# Patient Record
Sex: Male | Born: 1955 | Race: White | Hispanic: No | Marital: Married | Smoking: Former smoker
Health system: Southern US, Community
[De-identification: ages and names within clinical notes are randomized; demographics above are authoritative.]

## PROBLEM LIST (undated history)

## (undated) DIAGNOSIS — I739 Peripheral vascular disease, unspecified: Secondary | ICD-10-CM

## (undated) DIAGNOSIS — I480 Paroxysmal atrial fibrillation: Secondary | ICD-10-CM

## (undated) DIAGNOSIS — M1990 Unspecified osteoarthritis, unspecified site: Secondary | ICD-10-CM

## (undated) DIAGNOSIS — Q231 Congenital insufficiency of aortic valve: Secondary | ICD-10-CM

## (undated) DIAGNOSIS — I712 Thoracic aortic aneurysm, without rupture: Secondary | ICD-10-CM

## (undated) DIAGNOSIS — E119 Type 2 diabetes mellitus without complications: Secondary | ICD-10-CM

## (undated) DIAGNOSIS — N183 Chronic kidney disease, stage 3 unspecified: Secondary | ICD-10-CM

## (undated) DIAGNOSIS — D126 Benign neoplasm of colon, unspecified: Secondary | ICD-10-CM

## (undated) DIAGNOSIS — R0600 Dyspnea, unspecified: Secondary | ICD-10-CM

## (undated) DIAGNOSIS — C220 Liver cell carcinoma: Secondary | ICD-10-CM

## (undated) DIAGNOSIS — D689 Coagulation defect, unspecified: Secondary | ICD-10-CM

## (undated) DIAGNOSIS — E785 Hyperlipidemia, unspecified: Secondary | ICD-10-CM

## (undated) DIAGNOSIS — I209 Angina pectoris, unspecified: Secondary | ICD-10-CM

## (undated) DIAGNOSIS — I2510 Atherosclerotic heart disease of native coronary artery without angina pectoris: Secondary | ICD-10-CM

## (undated) DIAGNOSIS — I10 Essential (primary) hypertension: Secondary | ICD-10-CM

## (undated) HISTORY — DX: Peripheral vascular disease, unspecified: I73.9

## (undated) HISTORY — DX: Benign neoplasm of colon, unspecified: D12.6

## (undated) HISTORY — DX: Essential (primary) hypertension: I10

## (undated) HISTORY — PX: COLONOSCOPY W/ POLYPECTOMY: SHX1380

## (undated) HISTORY — PX: POLYPECTOMY: SHX149

## (undated) HISTORY — DX: Thoracic aortic aneurysm, without rupture: I71.2

## (undated) HISTORY — PX: TONSILLECTOMY: SUR1361

## (undated) HISTORY — PX: COLONOSCOPY: SHX174

## (undated) HISTORY — DX: Coagulation defect, unspecified: D68.9

## (undated) HISTORY — DX: Hyperlipidemia, unspecified: E78.5

## (undated) HISTORY — PX: COSMETIC SURGERY: SHX468

## (undated) HISTORY — DX: Congenital insufficiency of aortic valve: Q23.1

## (undated) HISTORY — PX: UPPER GASTROINTESTINAL ENDOSCOPY: SHX188

## (undated) HISTORY — DX: Type 2 diabetes mellitus without complications: E11.9

## (undated) HISTORY — DX: Paroxysmal atrial fibrillation: I48.0

## (undated) HISTORY — DX: Liver cell carcinoma: C22.0

---

## 2001-10-27 ENCOUNTER — Emergency Department (HOSPITAL_COMMUNITY): Admission: EM | Admit: 2001-10-27 | Discharge: 2001-10-27 | Payer: Self-pay | Admitting: Emergency Medicine

## 2001-11-25 ENCOUNTER — Encounter: Payer: Self-pay | Admitting: Gastroenterology

## 2006-01-29 ENCOUNTER — Ambulatory Visit: Payer: Self-pay | Admitting: Gastroenterology

## 2006-03-04 ENCOUNTER — Ambulatory Visit: Payer: Self-pay | Admitting: Gastroenterology

## 2008-01-27 ENCOUNTER — Telehealth: Payer: Self-pay | Admitting: Gastroenterology

## 2008-03-08 ENCOUNTER — Ambulatory Visit: Payer: Self-pay | Admitting: Gastroenterology

## 2008-03-08 DIAGNOSIS — K5130 Ulcerative (chronic) rectosigmoiditis without complications: Secondary | ICD-10-CM | POA: Insufficient documentation

## 2008-03-08 DIAGNOSIS — K5120 Ulcerative (chronic) proctitis without complications: Secondary | ICD-10-CM | POA: Insufficient documentation

## 2008-03-12 DIAGNOSIS — D126 Benign neoplasm of colon, unspecified: Secondary | ICD-10-CM

## 2008-03-12 HISTORY — DX: Benign neoplasm of colon, unspecified: D12.6

## 2008-04-07 ENCOUNTER — Encounter: Payer: Self-pay | Admitting: Gastroenterology

## 2008-04-07 ENCOUNTER — Ambulatory Visit: Payer: Self-pay | Admitting: Gastroenterology

## 2008-04-08 ENCOUNTER — Encounter: Payer: Self-pay | Admitting: Gastroenterology

## 2008-07-04 ENCOUNTER — Ambulatory Visit: Payer: Self-pay | Admitting: Gastroenterology

## 2008-07-04 DIAGNOSIS — Z860100 Personal history of colon polyps, unspecified: Secondary | ICD-10-CM | POA: Insufficient documentation

## 2008-07-04 DIAGNOSIS — K5190 Ulcerative colitis, unspecified, without complications: Secondary | ICD-10-CM | POA: Insufficient documentation

## 2008-12-13 ENCOUNTER — Telehealth: Payer: Self-pay | Admitting: Gastroenterology

## 2010-02-02 ENCOUNTER — Encounter (INDEPENDENT_AMBULATORY_CARE_PROVIDER_SITE_OTHER): Payer: Self-pay | Admitting: *Deleted

## 2010-02-16 ENCOUNTER — Encounter (INDEPENDENT_AMBULATORY_CARE_PROVIDER_SITE_OTHER): Payer: Self-pay | Admitting: *Deleted

## 2010-02-21 ENCOUNTER — Encounter (INDEPENDENT_AMBULATORY_CARE_PROVIDER_SITE_OTHER): Payer: Self-pay

## 2010-02-23 ENCOUNTER — Encounter (INDEPENDENT_AMBULATORY_CARE_PROVIDER_SITE_OTHER): Payer: Self-pay

## 2010-02-23 ENCOUNTER — Ambulatory Visit: Payer: Self-pay | Admitting: Gastroenterology

## 2010-04-10 ENCOUNTER — Ambulatory Visit: Payer: Self-pay | Admitting: Gastroenterology

## 2010-04-13 ENCOUNTER — Encounter: Payer: Self-pay | Admitting: Gastroenterology

## 2010-09-11 NOTE — Procedures (Signed)
Summary: Colonoscopy  Patient: Matthew Chen Note: All result statuses are Final unless otherwise noted.  Tests: (1) Colonoscopy (COL)   COL Colonoscopy           [facility].     [place], [place]  [address]           COLONOSCOPY PROCEDURE REPORT     PATIENT:  Matthew, Chen  MR#:  [id number]     BIRTHDATE:  [birth year], 7 yrs. old  GENDER:  male     ENDOSCOPIST:  Norberto Sorenson T. Fuller Plan, MD, [facility]           PROCEDURE DATE:  04/10/2010     PROCEDURE:  Colonoscopy with biopsy     ASA CLASS:  Class II     INDICATIONS:  surveillance and high-risk screening, evaluation of     ulcerative colitis, follow-up of polyp, adenomatous polyp, 03/2008.           MEDICATIONS:   Fentanyl 50 mcg IV, Versed 8 mg IV     DESCRIPTION OF PROCEDURE:   After the risks benefits and     alternatives of the procedure were thoroughly explained, informed     consent was obtained.  Digital rectal exam was performed and     revealed no abnormalities.   The LB PCF-H180AL [id number] endoscope     was introduced through the anus and advanced to the cecum, which     was identified by both the appendix and ileocecal valve, without     limitations.  The quality of the prep was excellent, using     MoviPrep.  The instrument was then slowly withdrawn as the colon     was fully examined.     <<PROCEDUREIMAGES>>     FINDINGS:  There were mucosal changes consistent with left-sided     ulcerative colitis. They were erythematous. Multiple psuedopolyps     in the descending colon. Random biopsies were obtained and sent to     pathology.  A normal appearing cecum, ileocecal valve, and     appendiceal orifice were identified. The ascending, hepatic     flexure, transverse, splenic flexure appeared unremarkable. Random     biopsies were obtained and sent to pathology.   Retroflexed views     in the rectum revealed internal hemorrhoids,small.  The time to     cecum =  3  minutes. The scope was then  withdrawn (time =  10     min) from the patient and the procedure completed.           COMPLICATIONS:  None           ENDOSCOPIC IMPRESSION:     1) Colitis - left UC     2) Internal hemorrhoids           RECOMMENDATIONS:     1) Await pathology results     2) Repeat Colonoscopy in 2 years pending pathology review     3) Schedule followup visit in 6 months           Malcolm T. Fuller Plan, MD, [facility]           CC: Margaretmary Eddy MD           n.     Lorrin MaisPricilla Riffle. Stark at 04/10/2010 09:30 AM           Donavan Burnet, [id number]  Note: An exclamation mark Marland Kitchen)  indicates a result that was not dispersed into the flowsheet. Document Creation Date: 04/10/2010 9:32 AM _______________________________________________________________________  (1) Order result status: Final Collection or observation date-time: 04/10/2010 09:23 Requested date-time:  Receipt date-time:  Reported date-time:  Referring Physician:   Ordering Physician: Lucio Edward [phone]) Specimen Source:  Source: Tawanna Cooler Order Number: [id number] Lab site:   Appended Document: Colonoscopy     Appended Document: Colonoscopy colon recall 2years 03/12/2012

## 2010-09-11 NOTE — Letter (Signed)
Summary: Patient Notice- Colon Biospy Results  [facility]  [address] [place], [place] [address]   Phone: [phone]  Fax: [phone]        April 13, 2010 MRN: [id number]    Matthew Chen [address] [place], [place]  [address]    Dear [name],  I am pleased to inform you that the biopsies taken during your recent colonoscopy did not show any evidence of cancer upon pathologic examination. The biopsies showed mild left sided ulcerative colitis.  Continue with the treatment plan as outlined on the day of your      exam.  You should have a repeat colonoscopy examination for this problem           in 2 years.  Please call us if you are having persistent problems or have questions about your condition that have not been fully answered at this time.  Sincerely,  [name] MD [facility]  This letter has been electronically signed by your physician.  Appended Document: Patient Notice- Colon Biospy Results letter mailed 9.7.2011

## 2010-09-11 NOTE — Miscellaneous (Signed)
Summary: Lec previsit  Clinical Lists Changes  Medications: Added new medication of MOVIPREP 100 GM  SOLR (PEG-KCL-NACL-NASULF-NA ASC-C) As per prep instructions. - Signed Rx of MOVIPREP 100 GM  SOLR (PEG-KCL-NACL-NASULF-NA ASC-C) As per prep instructions.;  #1 x 0;  Signed;  Entered by: Cornelia Copa RN;  Authorized by: [name] MD [facility];  Method used: Electronically to [facility] [phone]*, [address] [place], [place]  [address], Ph: [phone], Fax: [phone] Observations: Added new observation of ALLERGY REV: Done (02/23/2010 9:47)    Prescriptions: MOVIPREP 100 GM  SOLR (PEG-KCL-NACL-NASULF-NA ASC-C) As per prep instructions.  #1 x 0   Entered by:   Cornelia Copa RN   Authorized by:   [name] MD [facility]   Signed by:   Cornelia Copa RN on 02/23/2010   Method used:   Electronically to        [facility] [phone]* (retail)       [address]       [place], [place]  [address]       Ph: [phone]       Fax: [phone]   RxID:   [id number]

## 2010-09-11 NOTE — Letter (Signed)
Summary: Previsit letter  [facility]  [place], [place] [address]   Phone: [phone]  Fax: [phone]       02/16/2010 MRN: [id number]  Matthew Chen [place], [place]  [address]  Dear [name],  Welcome to the Gastroenterology Division at [facility].    You are scheduled to see a nurse for your pre-procedure visit on February 23, 2010 at 10:00 am on the 3rd floor at [facility].  We ask that you try to arrive at our office 15 minutes prior to your appointment time to allow for check-in.  Your nurse visit will consist of discussing your medical and surgical history, your immediate family medical history, and your medications.    Please bring a complete list of all your medications or, if you prefer, bring the medication bottles and we will list them.  We will need to be aware of both prescribed and over the counter drugs.  We will need to know exact dosage information as well.  If you are on blood thinners (Coumadin, Plavix, Aggrenox, Ticlid, etc.) please call our office today/prior to your appointment, as we need to consult with your physician about holding your medication.   Please be prepared to read and sign documents such as consent forms, a financial agreement, and acknowledgement forms.  If necessary, and with your consent, a friend or relative is welcome to sit-in on the nurse visit with you.  Please bring your insurance card so that we may make a copy of it.  If your insurance requires a referral to see a specialist, please bring your referral form from your primary care physician.  No co-pay is required for this nurse visit.     If you cannot keep your appointment, please call [phone] to cancel or reschedule prior to your appointment date.  This allows [place] the opportunity to schedule an appointment for another patient in need of care.    Thank you for choosing [facility] for your medical  needs.  We appreciate the opportunity to care for you.  Please visit [place] at our website  to learn more about our practice.                     Sincerely.                                                                                                                   The Gastroenterology Division

## 2010-09-11 NOTE — Letter (Signed)
Summary: Diabetic Instructions  [facility]  [place], [place] [address]   Phone: [phone]  Fax: [phone]    GRANTLEY SAVAGE 1956/07/21 MRN: [id number]   _  _   ORAL DIABETIC MEDICATION INSTRUCTIONS  The day before your procedure:   Take your diabetic pill as you do normally  The day of your procedure:   Do not take your diabetic pill    We will check your blood sugar levels during the admission process and again in Recovery before discharging you home  ________________________________________________________________________  _  _   INSULIN (LONG ACTING) MEDICATION INSTRUCTIONS (Lantus, NPH, 70/30, Humulin, Novolin-N)   The day before your procedure:   Take  your regular evening dose    The day of your procedure:   Do not take your morning dose    _  _   INSULIN (SHORT ACTING) MEDICATION INSTRUCTIONS (Regular, Humulog, Novolog)   The day before your procedure:   Do not take your evening dose   The day of your procedure:   Do not take your morning dose   _  _   INSULIN PUMP MEDICATION INSTRUCTIONS  We will contact the physician managing your diabetic care for written dosage instructions for the day before your procedure and the day of your procedure.  Once we have received the instructions, we will contact you.

## 2010-09-11 NOTE — Letter (Signed)
Summary: Colonoscopy Letter  [facility]  [place], [place] [address]   Phone: [phone]  Fax: [phone]      February 02, 2010 MRN: [id number]   NEILS SIRACUSA [address] [place], [place]  [address]   Dear Mr. Hulgan,   According to your medical record, it is time for you to schedule a Colonoscopy. The American Cancer Society recommends this procedure as a method to detect early colon cancer. Patients with a family history of colon cancer, or a personal history of colon polyps or inflammatory bowel disease are at increased risk.  This letter has beeen generated based on the recommendations made at the time of your procedure. If you feel that in your particular situation this may no longer apply, please contact our office.  Please call our office at [phone] to schedule this appointment or to update your records at your earliest convenience.  Thank you for cooperating with [place] to provide you with the very best care possible.   Sincerely,  Norberto Sorenson T. Fuller Plan, M.D.  [facility] [phone]

## 2010-09-11 NOTE — Letter (Signed)
Summary: [facility] Instructions  [facility]  [place], [place] [address]   Phone: [phone]  Fax: [phone]       MENDELL BONTEMPO    12-Feb-1968    MRN: [id number]        Procedure Day Sudie Grumbling:  Duke Salvia  04/10/10     Arrival Time:  8:00AM     Procedure Time:  9:00AM     Location of Procedure:                    [facility] _  [place] (4th Floor)                        [place]   Starting 5 days prior to your procedure 04/05/10 do not eat nuts, seeds, popcorn, corn, beans, peas,  salads, or any raw vegetables.  Do not take any fiber supplements (e.g. Metamucil, Citrucel, and Benefiber).  THE DAY BEFORE YOUR PROCEDURE         DATE: 04/09/10  DAY: MONDAY  1.  Drink clear liquids the entire day-NO SOLID FOOD  2.  Do not drink anything colored red or purple.  Avoid juices with pulp.  No orange juice.  3.  Drink at least 64 oz. (8 glasses) of fluid/clear liquids during the day to prevent dehydration and help the prep work efficiently.  CLEAR LIQUIDS INCLUDE: Water Jello Ice Popsicles Tea (sugar ok, no milk/cream) Powdered fruit flavored drinks Coffee (sugar ok, no milk/cream) Gatorade Juice: apple, white grape, white cranberry  Lemonade Clear bullion, consomm, broth Carbonated beverages (any kind) Strained chicken noodle soup Hard Candy                             4.  In the morning, mix first dose of MoviPrep solution:    Empty 1 Pouch A and 1 Pouch B into the disposable container    Add lukewarm drinking water to the top line of the container. Mix to dissolve    Refrigerate (mixed solution should be used within 24 hrs)  5.  Begin drinking the prep at 5:00 p.m. The MoviPrep container is divided by 4 marks.   Every 15 minutes drink the solution down to the next mark (approximately 8 oz) until the full liter is complete.   6.  Follow completed prep with 16 oz of clear liquid of your choice (Nothing  red or purple).  Continue to drink clear liquids until bedtime.  7.  Before going to bed, mix second dose of MoviPrep solution:    Empty 1 Pouch A and 1 Pouch B into the disposable container    Add lukewarm drinking water to the top line of the container. Mix to dissolve    Refrigerate  THE DAY OF YOUR PROCEDURE      DATE: 04/10/10   DAY: TUESDAY  Beginning at 4:00AM (5 hours before procedure)         1. Every 15 minutes, drink the solution down to the next mark (approx 8 oz) until the full liter is complete.  2. Follow completed prep with 16 oz. of clear liquid of your choice.    3. You may drink clear liquids until 7:00AM  (2  HOURS BEFORE PROCEDURE)   MEDICATION INSTRUCTIONS  Unless otherwise instructed, you should take regular prescription medications with a small sip of water   as early as  possible the morning of your procedure.  Diabetic patients - see separate instructions. .  Additional medication instructions: Do not take the Diovan am of procedure.         OTHER INSTRUCTIONS  You will need a responsible adult at least 55 years of age to accompany you and drive you home.   This person must remain in the waiting room during your procedure.  Wear loose fitting clothing that is easily removed.  Leave jewelry and other valuables at home.  However, you may wish to bring a book to read or  an iPod/MP3 player to listen to music as you wait for your procedure to start.  Remove all body piercing jewelry and leave at home.  Total time from sign-in until discharge is approximately 2-3 hours.  You should go home directly after your procedure and rest.  You can resume normal activities the  day after your procedure.  The day of your procedure you should not:   Drive   Make legal decisions   Operate machinery   Drink alcohol   Return to work  You will receive specific instructions about eating, activities and medications before you leave.    The above  instructions have been reviewed and explained to me by   Cornelia Copa RN  February 23, 2010 10:10 AM    I fully understand and can verbalize these instructions _____________________________ Date _________

## 2010-12-28 NOTE — Assessment & Plan Note (Signed)
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  His symptoms have completely abated on Colazal and Canasa  suppositories.  He has no gastrointestinal complaints.   MEDICATIONS:  Listed on the chart, updated, and reviewed.   MEDICATION ALLERGIES:  PENICILLIN.   EXAM:  GENERAL:  No acute distress.  VITALS:  Weight 214.2 pounds.  Blood pressure 126/64.  Pulse 78 and regular.  CHEST:  Clear to auscultation bilaterally.  CARDIAC:  Regular rate and rhythm without murmurs.  ABDOMEN:  Soft, nontender, nondistended.  Normoactive bowel sounds.  No  palpable organomegaly, masses, or hernias.   LABORATORY DATA:  Reports from stool studies dated February 03, 2006, reviewed.  All negative except for PMNs noted.   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  Discontinue Canasa and maintain Colazal at 750 mg, 3  p.o. t.i.d.  Return office visit in six months.                                   Pricilla Riffle. Dagoberto Ligas., MD, [facility]   MTS/MedQ  DD:  03/09/2006  DT:  03/09/2006  Job #:  [id number]

## 2011-04-19 ENCOUNTER — Other Ambulatory Visit: Payer: Self-pay | Admitting: Gastroenterology

## 2011-04-19 MED ORDER — BALSALAZIDE DISODIUM 750 MG PO CAPS: 2250.0000 mg | ORAL_CAPSULE | Freq: Three times a day (TID) | ORAL | Status: DC

## 2011-04-19 NOTE — Telephone Encounter (Signed)
Informed patient that I will send him one refill to his pharmacy but he needs to schedule a office visit for any further refills. Pt needed a 6 month f/u after Colonoscopy last year and had not scheduled one. Pt agreed and scheduled a office visit. Informed patient that he will not get any more refills until he comes for the office visit. Pt agreed and verbalized understanding.

## 2011-05-01 ENCOUNTER — Ambulatory Visit (INDEPENDENT_AMBULATORY_CARE_PROVIDER_SITE_OTHER): Payer: BC Managed Care – PPO | Admitting: Gastroenterology

## 2011-05-01 ENCOUNTER — Encounter: Payer: Self-pay | Admitting: Gastroenterology

## 2011-05-01 VITALS — BP 110/68 | HR 72 | Ht 70.0 in | Wt 230.0 lb

## 2011-05-01 DIAGNOSIS — R142 Eructation: Secondary | ICD-10-CM

## 2011-05-01 DIAGNOSIS — K5190 Ulcerative colitis, unspecified, without complications: Secondary | ICD-10-CM

## 2011-05-01 DIAGNOSIS — R141 Gas pain: Secondary | ICD-10-CM

## 2011-05-01 MED ORDER — BALSALAZIDE DISODIUM 750 MG PO CAPS: 2250.0000 mg | ORAL_CAPSULE | Freq: Three times a day (TID) | ORAL | Status: DC

## 2011-05-01 MED ORDER — ALIGN 4 MG PO CAPS: 1.0000 | ORAL_CAPSULE | Freq: Every day | ORAL | Status: DC

## 2011-05-01 NOTE — Patient Instructions (Addendum)
Your prescription for Colazal has been sent to the pharmacy.  Start Align one capsule by mouth once daily x 1 month.  Low gas diet given. Use Gas-X four times a day as needed. cc: Odette Fraction, MD

## 2011-05-01 NOTE — Progress Notes (Signed)
History of Present Illness: This is a 55 year old male returning for followup of ulcerative colitis. He notes increased flatus and slightly looser stools for the past 6 weeks. He has not noted multiple bowel movements daily, or mucous per rectum or bleeding per rectum. He denies any recent medication changes or recent antibiotic usage. Denies weight loss, abdominal pain, constipation, diarrhea, change in stool caliber, melena, hematochezia, nausea, vomiting, dysphagia, reflux symptoms, chest pain.  Current Medications, Allergies, Past Medical History, Past Surgical History, Family History and Social History were reviewed in [employer] record.  Physical Exam: General: Well developed , well nourished, no acute distress Head: Normocephalic and atraumatic Eyes:  sclerae anicteric, EOMI Ears: Normal auditory acuity Mouth: No deformity or lesions Lungs: Clear throughout to auscultation Heart: Regular rate and rhythm; no murmurs, rubs or bruits Abdomen: Soft, non tender and non distended. No masses, hepatosplenomegaly or hernias noted. Normal Bowel sounds Musculoskeletal: Symmetrical with no gross deformities  Pulses:  Normal pulses noted Extremities: No clubbing, cyanosis, edema or deformities noted Neurological: Alert oriented x 4, grossly nonfocal Psychological:  Alert and cooperative. Normal mood and affect  Assessment and Recommendations:  1. Ulcerative pancolitis. Continue balsalzide at current dosage. His recent changes in bowel pattern could be an early sign of a colitis flare. Trial of a daily probiotic and Gas-X 4 times a day when necessary. Request blood work recently performed at Dr. Samella Parr office. If his symptoms fail to resolve he is to contact us. Surveillance colonoscopy August 2013.

## 2011-12-17 ENCOUNTER — Other Ambulatory Visit: Payer: Self-pay | Admitting: Gastroenterology

## 2011-12-17 MED ORDER — BALSALAZIDE DISODIUM 750 MG PO CAPS: 2250.0000 mg | ORAL_CAPSULE | Freq: Three times a day (TID) | ORAL | Status: DC

## 2011-12-17 NOTE — Telephone Encounter (Signed)
Sent prescription to the pharmacy.

## 2012-01-09 ENCOUNTER — Encounter: Payer: Self-pay | Admitting: Gastroenterology

## 2012-02-04 ENCOUNTER — Encounter: Payer: Self-pay | Admitting: Gastroenterology

## 2012-03-24 ENCOUNTER — Ambulatory Visit (AMBULATORY_SURGERY_CENTER): Payer: BC Managed Care – PPO

## 2012-03-24 ENCOUNTER — Encounter: Payer: Self-pay | Admitting: Gastroenterology

## 2012-03-24 VITALS — Ht 70.0 in | Wt 229.3 lb

## 2012-03-24 DIAGNOSIS — Z800 Family history of malignant neoplasm of digestive organs: Secondary | ICD-10-CM

## 2012-03-24 DIAGNOSIS — Z1211 Encounter for screening for malignant neoplasm of colon: Secondary | ICD-10-CM

## 2012-03-24 DIAGNOSIS — K5190 Ulcerative colitis, unspecified, without complications: Secondary | ICD-10-CM

## 2012-03-24 DIAGNOSIS — Z86010 Personal history of colonic polyps: Secondary | ICD-10-CM

## 2012-03-24 DIAGNOSIS — Z8371 Family history of colonic polyps: Secondary | ICD-10-CM

## 2012-03-24 MED ORDER — MOVIPREP 100 G PO SOLR: ORAL | Status: DC

## 2012-04-07 ENCOUNTER — Ambulatory Visit (AMBULATORY_SURGERY_CENTER): Payer: BC Managed Care – PPO | Admitting: Gastroenterology

## 2012-04-07 ENCOUNTER — Encounter: Payer: Self-pay | Admitting: Gastroenterology

## 2012-04-07 VITALS — BP 123/82 | HR 99 | Temp 97.9°F | Resp 14 | Ht 70.0 in | Wt 230.0 lb

## 2012-04-07 DIAGNOSIS — D126 Benign neoplasm of colon, unspecified: Secondary | ICD-10-CM

## 2012-04-07 DIAGNOSIS — Z1211 Encounter for screening for malignant neoplasm of colon: Secondary | ICD-10-CM

## 2012-04-07 DIAGNOSIS — Z86010 Personal history of colonic polyps: Secondary | ICD-10-CM

## 2012-04-07 DIAGNOSIS — K5190 Ulcerative colitis, unspecified, without complications: Secondary | ICD-10-CM

## 2012-04-07 MED ORDER — SODIUM CHLORIDE 0.9 % IV SOLN: 500.0000 mL | INTRAVENOUS | Status: DC

## 2012-04-07 NOTE — Progress Notes (Signed)
Patient did not experience any of the following events: a burn prior to discharge; a fall within the facility; wrong site/side/patient/procedure/implant event; or a hospital transfer or hospital admission upon discharge from the facility. (G8907) Patient did not have preoperative order for IV antibiotic SSI prophylaxis. (G8918)  

## 2012-04-07 NOTE — Op Note (Signed)
[facility]. [place], [address]   COLONOSCOPY PROCEDURE REPORT PATIENT: Matthew Chen, Matthew Chen  MR#: [id number] BIRTHDATE: August 04, 1956 , 56  yrs. old GENDER: Male ENDOSCOPIST: [name], MD, [facility] Referring Physician] PROCEDURE DATE:  04/07/2012 PROCEDURE:   Colonoscopy with snare polypectomy and Colonoscopy with biopsy ASA CLASS:   Class II INDICATIONS:high risk patient with UC pancolitis 8+ years, and high risk patient with personal history of colonic polyps, 03/2008. MEDICATIONS: MAC sedation, administered by CRNA and propofol (Diprivan) 225m IV DESCRIPTION OF PROCEDURE:   After the risks benefits and alternatives of the procedure were thoroughly explained, informed consent was obtained.  A digital rectal exam revealed no abnormalities of the rectum.   The LB CF-H180AL [id number] endoscope was introduced through the anus and advanced to the cecum, which was identified by both the appendix and ileocecal valve. No adverse events experienced.   The quality of the prep was good, using MoviPrep  The instrument was then slowly withdrawn as the colon was fully examined.  COLON FINDINGS: A sessile polyp measuring 7 mm in size was found at the splenic flexure.  A polypectomy was performed with a cold snare.  A sessile polyp measuring 6 mm in size was found in the descending colon.  A polypectomy was performed with a cold snare. A sessile polyp measuring 6 mm in size was found in the sigmoid colon.  A polypectomy was performed with a cold snare. Pseudopolyps were found in the descending colon. Mild colitis was found in the left colon with erythema and slight loss of vascular pattern. Multiple random biopsies of the area were performed throughout the colon. The colon mucosa was otherwise normal. Retroflexed views revealed small internal hemorrhoids. The time to cecum=2 minutes 20 seconds  Withdrawal time=11 minutes 30 seconds. The scope was withdrawn and the  procedure completed. COMPLICATIONS: There were no complications.  ENDOSCOPIC IMPRESSION: 1.   Sessile polyp at the splenic flexure; polypectomy with a cold snare 2.   Sessile polyp in the descending colon; polypectomy with a cold snare 3.   Sessile polyp in the sigmoid colon; polypectomy with a cold snare 4.   Pseudopolyps in the descending colon 5.   Mild left sided colitis 6.   Small internal hemorrhoids  RECOMMENDATIONS: 1.  Await pathology results 2.  Repeat Colonoscopy in 2 years pending pathology review  eSigned:  MLadene Artist MD, FTah[facility]08/27/2013 11:08 AM   cc: WJenna Luo MD   PATIENT NAME:  Matthew Chen, EngramMR#: [id number]

## 2012-04-07 NOTE — Patient Instructions (Signed)
YOU HAD AN ENDOSCOPIC PROCEDURE TODAY AT THE [facility]: Refer to the procedure report that was given to you for any specific questions about what was found during the examination.  If the procedure report does not answer your questions, please call your gastroenterologist to clarify.  If you requested that your care partner not be given the details of your procedure findings, then the procedure report has been included in a sealed envelope for you to review at your convenience later.  YOU SHOULD EXPECT: Some feelings of bloating in the abdomen. Passage of more gas than usual.  Walking can help get rid of the air that was put into your GI tract during the procedure and reduce the bloating. If you had a lower endoscopy (such as a colonoscopy or flexible sigmoidoscopy) you may notice spotting of blood in your stool or on the toilet paper. If you underwent a bowel prep for your procedure, then you may not have a normal bowel movement for a few days.  DIET: Your first meal following the procedure should be a light meal and then it is ok to progress to your normal diet.  A half-sandwich or bowl of soup is an example of a good first meal.  Heavy or fried foods are harder to digest and may make you feel nauseous or bloated.  Likewise meals heavy in dairy and vegetables can cause extra gas to form and this can also increase the bloating.  Drink plenty of fluids but you should avoid alcoholic beverages for 24 hours.  ACTIVITY: Your care partner should take you home directly after the procedure.  You should plan to take it easy, moving slowly for the rest of the day.  You can resume normal activity the day after the procedure however you should NOT DRIVE or use heavy machinery for 24 hours (because of the sedation medicines used during the test).    SYMPTOMS TO REPORT IMMEDIATELY: A gastroenterologist can be reached at any hour.  During normal business hours, 8:30 AM to 5:00 PM Monday through Friday,  call [phone].  After hours and on weekends, please call the GI answering service at [phone] who will take a message and have the physician on call contact you.   Following lower endoscopy (colonoscopy or flexible sigmoidoscopy):  Excessive amounts of blood in the stool  Significant tenderness or worsening of abdominal pains  Swelling of the abdomen that is new, acute  Fever of 100F or higher  Following upper endoscopy (EGD)  Vomiting of blood or coffee ground material  New chest pain or pain under the shoulder blades  Painful or persistently difficult swallowing  New shortness of breath  Fever of 100F or higher  Black, tarry-looking stools  FOLLOW UP: If any biopsies were taken you will be contacted by phone or by letter within the next 1-3 weeks.  Call your gastroenterologist if you have not heard about the biopsies in 3 weeks.  Our staff will call the home number listed on your records the next business day following your procedure to check on you and address any questions or concerns that you may have at that time regarding the information given to you following your procedure. This is a courtesy call and so if there is no answer at the home number and we have not heard from you through the emergency physician on call, we will assume that you have returned to your regular daily activities without incident.  SIGNATURES/CONFIDENTIALITY: You and/or your care   partner have signed paperwork which will be entered into your electronic medical record.  These signatures attest to the fact that that the information above on your After Visit Summary has been reviewed and is understood.  Full responsibility of the confidentiality of this discharge information lies with you and/or your care-partner.  

## 2012-04-08 ENCOUNTER — Telehealth: Payer: Self-pay | Admitting: *Deleted

## 2012-04-08 NOTE — Telephone Encounter (Signed)
  Follow up Call-  Call back number 04/07/2012  Post procedure Call Back phone  # [phone]  Permission to leave phone message Yes     Patient questions:  Do you have a fever, pain , or abdominal swelling? no Pain Score  0 *  Have you tolerated food without any problems? yes  Have you been able to return to your normal activities? yes  Do you have any questions about your discharge instructions: Diet   no Medications  no Follow up visit  no  Do you have questions or concerns about your Care? no  Actions: * If pain score is 4 or above: No action needed, pain <4.

## 2012-04-14 ENCOUNTER — Encounter: Payer: Self-pay | Admitting: Gastroenterology

## 2012-06-26 ENCOUNTER — Other Ambulatory Visit: Payer: Self-pay

## 2012-06-26 MED ORDER — BALSALAZIDE DISODIUM 750 MG PO CAPS: 2250.0000 mg | ORAL_CAPSULE | Freq: Three times a day (TID) | ORAL | Status: DC

## 2012-12-15 ENCOUNTER — Telehealth: Payer: Self-pay | Admitting: Family Medicine

## 2012-12-16 MED ORDER — BENAZEPRIL HCL 40 MG PO TABS: 40.0000 mg | ORAL_TABLET | Freq: Every day | ORAL | Status: DC

## 2012-12-16 MED ORDER — SIMVASTATIN 20 MG PO TABS: 20.0000 mg | ORAL_TABLET | Freq: Every evening | ORAL | Status: DC

## 2012-12-16 MED ORDER — GLIPIZIDE 10 MG PO TABS: 10.0000 mg | ORAL_TABLET | Freq: Every day | ORAL | Status: DC

## 2012-12-16 MED ORDER — HYDROCHLOROTHIAZIDE 25 MG PO TABS: 25.0000 mg | ORAL_TABLET | Freq: Every day | ORAL | Status: DC

## 2012-12-16 MED ORDER — FENOFIBRATE 160 MG PO TABS: 160.0000 mg | ORAL_TABLET | Freq: Every day | ORAL | Status: DC

## 2012-12-16 NOTE — Telephone Encounter (Signed)
Rx Refilled  

## 2013-01-16 ENCOUNTER — Other Ambulatory Visit: Payer: Self-pay | Admitting: Gastroenterology

## 2013-01-18 NOTE — Telephone Encounter (Signed)
NEEDS OFFICE VISIT.

## 2013-02-22 ENCOUNTER — Encounter: Payer: Self-pay | Admitting: Family Medicine

## 2013-02-22 ENCOUNTER — Ambulatory Visit (INDEPENDENT_AMBULATORY_CARE_PROVIDER_SITE_OTHER): Payer: BC Managed Care – PPO | Admitting: Family Medicine

## 2013-02-22 VITALS — BP 130/80 | HR 68 | Temp 97.7°F | Resp 18 | Wt 229.0 lb

## 2013-02-22 DIAGNOSIS — E119 Type 2 diabetes mellitus without complications: Secondary | ICD-10-CM

## 2013-02-22 DIAGNOSIS — D489 Neoplasm of uncertain behavior, unspecified: Secondary | ICD-10-CM

## 2013-02-22 DIAGNOSIS — I10 Essential (primary) hypertension: Secondary | ICD-10-CM

## 2013-02-22 DIAGNOSIS — E785 Hyperlipidemia, unspecified: Secondary | ICD-10-CM

## 2013-02-22 LAB — COMPLETE METABOLIC PANEL WITH GFR
ALT: 35 U/L (ref 0–53)
Albumin: 4.3 g/dL (ref 3.5–5.2)
Alkaline Phosphatase: 53 U/L (ref 39–117)
CO2: 24 mEq/L (ref 19–32)
GFR, Est African American: 58 mL/min — ABNORMAL LOW
GFR, Est Non African American: 51 mL/min — ABNORMAL LOW
Glucose, Bld: 145 mg/dL — ABNORMAL HIGH (ref 70–99)
Potassium: 4.6 mEq/L (ref 3.5–5.3)
Sodium: 140 mEq/L (ref 135–145)
Total Protein: 6.9 g/dL (ref 6.0–8.3)

## 2013-02-22 LAB — LIPID PANEL
LDL Cholesterol: 59 mg/dL (ref 0–99)
Total CHOL/HDL Ratio: 4.4 Ratio
Triglycerides: 261 mg/dL — ABNORMAL HIGH (ref <150)
VLDL: 52 mg/dL — ABNORMAL HIGH (ref 0–40)

## 2013-02-22 NOTE — Progress Notes (Signed)
Subjective:    Patient ID: Matthew Chen, male    DOB: Sep 19, 1955, 57 y.o.   MRN: [id number]  HPI Patient is here for followup of his diabetes for which he takes glipizide 10 mg by mouth daily.  He denies polyuria or polydipsia. His random sugars are 80-110.  He denies peripheral neuropathy..  he denies hypoglycemia.  He is also here for followup of his hypertension. He is taking benazepril 40 mg by mouth daily and hydrochlorothiazide 25 mg by mouth daily. He denies chest pain, shortness of breath, or dyspnea on exertion.Marland Kitchen  He also is here for hyperlipidemia. He is taking Lipitor 80 mg a day, and fenofibrate 160 mg a day. He denies myalgia or right upper quadrant pain.  He also has a brown elevated papule on his right temple he is requesting a biopsy due to concern about possible melanoma. He also has a 1 cm lesion on his right forearm that has been there for 6 months raised edges and central ulceration he is concerned could be ringworm but in my opinion looks more like a basal cell carcinoma. Past Medical History  Diagnosis Date  . Ulcerative colitis   . Hemorrhoids   . Diabetes mellitus   . Hyperlipidemia   . Hypertension   . Adenomatous colon polyp 03/2008   Current Outpatient Prescriptions on File Prior to Visit  Medication Sig Dispense Refill  . atorvastatin (LIPITOR) 80 MG tablet Take 80 mg by mouth daily.        . balsalazide (COLAZAL) 750 MG capsule TAKE 3 CAPSULES BY MOUTH 3 TIMES A DAY  270 capsule  0  . benazepril (LOTENSIN) 40 MG tablet Take 1 tablet (40 mg total) by mouth daily.  30 tablet  5  . fenofibrate 160 MG tablet Take 1 tablet (160 mg total) by mouth daily.  30 tablet  5  . glipiZIDE (GLUCOTROL) 10 MG tablet Take 1 tablet (10 mg total) by mouth daily.  30 tablet  5  . hydrochlorothiazide (HYDRODIURIL) 25 MG tablet Take 1 tablet (25 mg total) by mouth daily.  30 tablet  5  . Probiotic Product (ALIGN) 4 MG CAPS Take 1 capsule by mouth daily.  30 capsule  0  . simvastatin  (ZOCOR) 20 MG tablet Take 1 tablet (20 mg total) by mouth every evening.  30 tablet  5   No current facility-administered medications on file prior to visit.   Allergies  Allergen Reactions  . Penicillins     unsure   History   Social History  . Marital Status: Married    Spouse Name: N/A    Number of Children: 2  . Years of Education: N/A   Occupational History  . Truck driver   .     Social History Main Topics  . Smoking status: Former Smoker    Types: Cigarettes    Quit date: 08/12/1996  . Smokeless tobacco: Former Systems developer    Types: Chew    Quit date: 08/12/1996  . Alcohol Use: No     Comment: occasionally  . Drug Use: No  . Sexually Active: Not on file   Other Topics Concern  . Not on file   Social History Narrative  . No narrative on file   Family History  Problem Relation Age of Onset  . Colon cancer Maternal Grandmother     in her 86's  . Colon polyps Mother     in her 54's  . Diabetes Mother   . Diabetes  Maternal Aunt     Aunts and Uncles  . Heart disease Father   . Esophageal cancer Neg Hx   . Stomach cancer Neg Hx   . Rectal cancer Neg Hx       Review of Systems  All other systems reviewed and are negative.       Objective:   Physical Exam  Vitals reviewed. Constitutional: He is oriented to person, place, and time. He appears well-developed and well-nourished.  Cardiovascular: Normal rate, regular rhythm and intact distal pulses.   No murmur heard. Pulmonary/Chest: Effort normal and breath sounds normal.  Abdominal: Soft. Bowel sounds are normal. He exhibits no distension and no mass. There is no tenderness. There is no rebound and no guarding.  Neurological: He is alert and oriented to person, place, and time.  Skin: Skin is warm. No rash noted. No pallor.   there is a 1 cm brown papule on his right temple and a 1 cm lesion on his right forearm as I described in the history of present illness. Diabetic foot exam is performed.         Assessment & Plan:  1. Type II or unspecified type diabetes mellitus without mention of complication, not stated as uncontrolled Check hemoglobin A1c, Goal is less than 6.5. - Hemoglobin A1c  2. HTN (hypertension) Blood pressures well controlled. Continue current medications at present dosages.  3. HLD (hyperlipidemia) Next fasting lipid panel, goal LDL is less than 100. - COMPLETE METABOLIC PANEL WITH GFR - Lipid panel  4. Neoplasm of uncertain behavior I anesthetized both the lesion on his right temple and lesion on his right forearm 0.1% lidocaine with epinephrine. Using sterile technique, both lesions were then biopsied using a shave biopsy and sent to pathology in container is labeled "head" and "forearm".  Await pathology results. Hemostasis was achieved with Drysol and a Band-Aid - Pathology - Pathology

## 2013-02-23 ENCOUNTER — Encounter: Payer: Self-pay | Admitting: Family Medicine

## 2013-02-24 ENCOUNTER — Other Ambulatory Visit: Payer: Self-pay | Admitting: Gastroenterology

## 2013-02-24 NOTE — Telephone Encounter (Signed)
NEEDS OFFICE VISIT FOR ANY FURTHER REFILLS! 

## 2013-04-05 ENCOUNTER — Other Ambulatory Visit: Payer: Self-pay | Admitting: Gastroenterology

## 2013-04-06 NOTE — Telephone Encounter (Signed)
NEEDS OFFICE VISIT FOR FURTHER REFILLS!!

## 2013-07-16 ENCOUNTER — Encounter: Payer: Self-pay | Admitting: Family Medicine

## 2013-07-16 ENCOUNTER — Other Ambulatory Visit: Payer: Self-pay | Admitting: Family Medicine

## 2013-07-16 MED ORDER — ATORVASTATIN CALCIUM 80 MG PO TABS: 80.0000 mg | ORAL_TABLET | Freq: Every day | ORAL | Status: DC

## 2013-07-16 NOTE — Telephone Encounter (Signed)
All refills have already been addressed

## 2013-07-16 NOTE — Telephone Encounter (Signed)
Medication refilled per protocol. 

## 2013-07-22 ENCOUNTER — Ambulatory Visit (INDEPENDENT_AMBULATORY_CARE_PROVIDER_SITE_OTHER): Payer: BC Managed Care – PPO | Admitting: Family Medicine

## 2013-07-22 ENCOUNTER — Encounter: Payer: Self-pay | Admitting: Family Medicine

## 2013-07-22 VITALS — BP 132/74 | HR 82 | Temp 97.4°F | Resp 16 | Ht 70.0 in | Wt 234.0 lb

## 2013-07-22 DIAGNOSIS — H60391 Other infective otitis externa, right ear: Secondary | ICD-10-CM

## 2013-07-22 DIAGNOSIS — Z23 Encounter for immunization: Secondary | ICD-10-CM

## 2013-07-22 DIAGNOSIS — H60399 Other infective otitis externa, unspecified ear: Secondary | ICD-10-CM

## 2013-07-22 MED ORDER — CIPROFLOXACIN-DEXAMETHASONE 0.3-0.1 % OT SUSP: 4.0000 [drp] | Freq: Two times a day (BID) | OTIC | Status: DC

## 2013-07-22 MED ORDER — CEFDINIR 300 MG PO CAPS: 300.0000 mg | ORAL_CAPSULE | Freq: Two times a day (BID) | ORAL | Status: DC

## 2013-07-22 NOTE — Addendum Note (Signed)
Addended by: Shary Decamp B on: 07/22/2013 03:47 PM   Modules accepted: Orders

## 2013-07-22 NOTE — Progress Notes (Signed)
   Subjective:    Patient ID: Matthew Chen, male    DOB: 09-29-1955, 57 y.o.   MRN: [id number]  HPI  Patient reports one week of earache in the right year. He denies any fevers or chills. He is concerned he may have a cerumen impaction. The pain is such that is keeping him from sleeping at night.  He has not had any recent upper respiratory infections Past Medical History  Diagnosis Date  . Ulcerative colitis   . Hemorrhoids   . Diabetes mellitus   . Hyperlipidemia   . Hypertension   . Adenomatous colon polyp 03/2008   Current Outpatient Prescriptions on File Prior to Visit  Medication Sig Dispense Refill  . atorvastatin (LIPITOR) 80 MG tablet Take 1 tablet (80 mg total) by mouth daily.  30 tablet  1  . balsalazide (COLAZAL) 750 MG capsule TAKE 3 CAPSULES BY MOUTH 3 TIMES A DAY  270 capsule  0  . benazepril (LOTENSIN) 40 MG tablet TAKE 1 TABLET BY MOUTH EVERY DAY  30 tablet  1  . fenofibrate 160 MG tablet TAKE 1 TABLET BY MOUTH EVERY DAY  30 tablet  1  . glipiZIDE (GLUCOTROL) 10 MG tablet TAKE 1 TABLET BY MOUTH EVERY DAY  30 tablet  1  . hydrochlorothiazide (HYDRODIURIL) 25 MG tablet TAKE 1 TABLET BY MOUTH EVERY DAY  30 tablet  1  . Probiotic Product (ALIGN) 4 MG CAPS Take 1 capsule by mouth daily.  30 capsule  0   No current facility-administered medications on file prior to visit.   Allergies  Allergen Reactions  . Penicillins     unsure   History   Social History  . Marital Status: Married    Spouse Name: N/A    Number of Children: 2  . Years of Education: N/A   Occupational History  . Truck driver   .     Social History Main Topics  . Smoking status: Former Smoker    Types: Cigarettes    Quit date: 08/12/1996  . Smokeless tobacco: Former Systems developer    Types: Chew    Quit date: 08/12/1996  . Alcohol Use: No     Comment: occasionally  . Drug Use: No  . Sexual Activity: Not on file   Other Topics Concern  . Not on file   Social History Narrative  . No narrative on  file     Review of Systems  All other systems reviewed and are negative.       Objective:   Physical Exam  Vitals reviewed. Constitutional: He appears well-developed and well-nourished.  HENT:  Right Ear: There is swelling. Tympanic membrane is not injected, not scarred, not perforated and not erythematous.  Left Ear: External ear normal.  Nose: Nose normal.  Mouth/Throat: Oropharynx is clear and moist. No oropharyngeal exudate.   right external auditory canal is diffusely swollen, tender to the touch, and covered with a white exudate.        Assessment & Plan:  1. Otitis, externa, infective, right Recheck in one week if no better or sooner if - cefdinir (OMNICEF) 300 MG capsule; Take 1 capsule (300 mg total) by mouth 2 (two) times daily.  Dispense: 20 capsule; Refill: 0 - ciprofloxacin-dexamethasone (CIPRODEX) otic suspension; Place 4 drops into the right ear 2 (two) times daily.  Dispense: 7.5 mL; Refill: 0

## 2013-08-02 ENCOUNTER — Ambulatory Visit: Payer: BC Managed Care – PPO | Admitting: Family Medicine

## 2013-08-23 ENCOUNTER — Ambulatory Visit (INDEPENDENT_AMBULATORY_CARE_PROVIDER_SITE_OTHER): Payer: BC Managed Care – PPO | Admitting: Family Medicine

## 2013-08-23 ENCOUNTER — Encounter: Payer: Self-pay | Admitting: Family Medicine

## 2013-08-23 VITALS — BP 128/84 | HR 100 | Temp 97.1°F | Resp 20 | Ht 70.0 in | Wt 231.0 lb

## 2013-08-23 DIAGNOSIS — E119 Type 2 diabetes mellitus without complications: Secondary | ICD-10-CM

## 2013-08-23 DIAGNOSIS — E785 Hyperlipidemia, unspecified: Secondary | ICD-10-CM

## 2013-08-23 DIAGNOSIS — I10 Essential (primary) hypertension: Secondary | ICD-10-CM

## 2013-08-23 LAB — COMPLETE METABOLIC PANEL WITH GFR
ALT: 63 U/L — ABNORMAL HIGH (ref 0–53)
AST: 47 U/L — ABNORMAL HIGH (ref 0–37)
Albumin: 4.3 g/dL (ref 3.5–5.2)
Alkaline Phosphatase: 62 U/L (ref 39–117)
BILIRUBIN TOTAL: 0.6 mg/dL (ref 0.3–1.2)
BUN: 21 mg/dL (ref 6–23)
CHLORIDE: 103 meq/L (ref 96–112)
CO2: 27 meq/L (ref 19–32)
CREATININE: 1.4 mg/dL — AB (ref 0.50–1.35)
Calcium: 9.7 mg/dL (ref 8.4–10.5)
GFR, EST AFRICAN AMERICAN: 64 mL/min
GFR, Est Non African American: 55 mL/min — ABNORMAL LOW
GLUCOSE: 148 mg/dL — AB (ref 70–99)
Potassium: 4.4 mEq/L (ref 3.5–5.3)
Sodium: 140 mEq/L (ref 135–145)
Total Protein: 7.4 g/dL (ref 6.0–8.3)

## 2013-08-23 LAB — LIPID PANEL
CHOLESTEROL: 160 mg/dL (ref 0–200)
HDL: 35 mg/dL — ABNORMAL LOW (ref >39)
LDL CALC: 63 mg/dL (ref 0–99)
TRIGLYCERIDES: 309 mg/dL — AB (ref <150)
Total CHOL/HDL Ratio: 4.6 Ratio
VLDL: 62 mg/dL — ABNORMAL HIGH (ref 0–40)

## 2013-08-23 LAB — HEMOGLOBIN A1C
Hgb A1c MFr Bld: 8.2 % — ABNORMAL HIGH (ref <5.7)
Mean Plasma Glucose: 189 mg/dL — ABNORMAL HIGH (ref <117)

## 2013-08-23 MED ORDER — NEOMYCIN-POLYMYXIN-HC 3.5-10000-1 OT SOLN: 4.0000 [drp] | Freq: Four times a day (QID) | OTIC | Status: DC

## 2013-08-23 NOTE — Progress Notes (Signed)
Subjective:    Patient ID: Matthew Chen, male    DOB: 10-02-1955, 58 y.o.   MRN: [id number]  HPI 02/22/13  Patient is here for followup of his diabetes for which he takes glipizide 10 mg by mouth daily.  He denies polyuria or polydipsia. His random sugars are 80-110.  He denies peripheral neuropathy..  he denies hypoglycemia.  He is also here for followup of his hypertension. He is taking benazepril 40 mg by mouth daily and hydrochlorothiazide 25 mg by mouth daily. He denies chest pain, shortness of breath, or dyspnea on exertion.Marland Kitchen  He also is here for hyperlipidemia. He is taking Lipitor 80 mg a day, and fenofibrate 160 mg a day. He denies myalgia or right upper quadrant pain.  He also has a brown elevated papule on his right temple he is requesting a biopsy due to concern about possible melanoma. He also has a 1 cm lesion on his right forearm that has been there for 6 months raised edges and central ulceration he is concerned could be ringworm but in my opinion looks more like a basal cell carcinoma.  At that time, my plan was: 1. Type II or unspecified type diabetes mellitus without mention of complication, not stated as uncontrolled Check hemoglobin A1c, Goal is less than 6.5. - Hemoglobin A1c  2. HTN (hypertension) Blood pressures well controlled. Continue current medications at present dosages.  3. HLD (hyperlipidemia) Next fasting lipid panel, goal LDL is less than 100. - COMPLETE METABOLIC PANEL WITH GFR - Lipid panel  4. Neoplasm of uncertain behavior I anesthetized both the lesion on his right temple and lesion on his right forearm 0.1% lidocaine with epinephrine. Using sterile technique, both lesions were then biopsied using a shave biopsy and sent to pathology in container is labeled "head" and "forearm".  Await pathology results. Hemostasis was achieved with Drysol and a Band-Aid - Pathology - Pathology Labs revealed a HgA1c of 6.3 and decreased HDL and elevated Trigs.  I  recommend increased aerobic exercise, weight loss, and dietary changes.  He is here today for a recheck.  At the patient's last office visit he had a severe case of otitis externa in his right ear. Katie with both well in a box and topical drops. Symptoms improved after a week but he continues to have some fullness and occasional pain in his right ear canal. On examination today the right external auditory canal is still slightly swollen and erythematous. There is no exudate or drainage.   Past Medical History  Diagnosis Date  . Ulcerative colitis   . Hemorrhoids   . Diabetes mellitus   . Hyperlipidemia   . Hypertension   . Adenomatous colon polyp 03/2008   Current Outpatient Prescriptions on File Prior to Visit  Medication Sig Dispense Refill  . atorvastatin (LIPITOR) 80 MG tablet Take 1 tablet (80 mg total) by mouth daily.  30 tablet  1  . balsalazide (COLAZAL) 750 MG capsule TAKE 3 CAPSULES BY MOUTH 3 TIMES A DAY  270 capsule  0  . benazepril (LOTENSIN) 40 MG tablet TAKE 1 TABLET BY MOUTH EVERY DAY  30 tablet  1  . cefdinir (OMNICEF) 300 MG capsule Take 1 capsule (300 mg total) by mouth 2 (two) times daily.  20 capsule  0  . ciprofloxacin-dexamethasone (CIPRODEX) otic suspension Place 4 drops into the right ear 2 (two) times daily.  7.5 mL  0  . fenofibrate 160 MG tablet TAKE 1 TABLET BY MOUTH EVERY DAY  30 tablet  1  . glipiZIDE (GLUCOTROL) 10 MG tablet TAKE 1 TABLET BY MOUTH EVERY DAY  30 tablet  1  . hydrochlorothiazide (HYDRODIURIL) 25 MG tablet TAKE 1 TABLET BY MOUTH EVERY DAY  30 tablet  1  . Probiotic Product (ALIGN) 4 MG CAPS Take 1 capsule by mouth daily.  30 capsule  0   No current facility-administered medications on file prior to visit.   Allergies  Allergen Reactions  . Penicillins     unsure   History   Social History  . Marital Status: Married    Spouse Name: N/A    Number of Children: 2  . Years of Education: N/A   Occupational History  . Truck driver   .      Social History Main Topics  . Smoking status: Former Smoker    Types: Cigarettes    Quit date: 08/12/1996  . Smokeless tobacco: Former Systems developer    Types: Chew    Quit date: 08/12/1996  . Alcohol Use: No     Comment: occasionally  . Drug Use: No  . Sexual Activity: Not on file   Other Topics Concern  . Not on file   Social History Narrative  . No narrative on file   Family History  Problem Relation Age of Onset  . Colon cancer Maternal Grandmother     in her 23's  . Colon polyps Mother     in her 22's  . Diabetes Mother   . Diabetes Maternal Aunt     Aunts and Uncles  . Heart disease Father   . Esophageal cancer Neg Hx   . Stomach cancer Neg Hx   . Rectal cancer Neg Hx       Review of Systems  All other systems reviewed and are negative.       Objective:   Physical Exam  Vitals reviewed. Constitutional: He is oriented to person, place, and time. He appears well-developed and well-nourished.  Cardiovascular: Normal rate, regular rhythm and intact distal pulses.   No murmur heard. Pulmonary/Chest: Effort normal and breath sounds normal.  Abdominal: Soft. Bowel sounds are normal. He exhibits no distension and no mass. There is no tenderness. There is no rebound and no guarding.  Neurological: He is alert and oriented to person, place, and time.  Skin: Skin is warm. No rash noted. No pallor.          Assessment & Plan:  1. Type II or unspecified type diabetes mellitus without mention of complication, not stated as uncontrolled Check hemoglobin A1c. Goal hemoglobin A1c less than 6.5. - COMPLETE METABOLIC PANEL WITH GFR - Hemoglobin A1c  2. HLD (hyperlipidemia) Recheck fasting lipid panel. If the patient's triglycerides are still elevated, I would suggest decreasing the hydrochlorothiazide rather than increasing cholesterol medications as hydrochlorothiazide can increase triglycerides. - COMPLETE METABOLIC PANEL WITH GFR - Lipid panel  3. HTN  (hypertension) Furthermore the patient's blood pressure is well controlled today.  No change in medication unless we decide to decrease hydrochlorothiazide. - COMPLETE METABOLIC PANEL WITH GFR  Patient has mild persistent otitis externa.  I recommended Cortisporin HC drops 4 drops 4 times a day for 7-10 days. If symptoms do not completely improve I recommend ENT consult for persistent otitis externa.

## 2013-08-27 ENCOUNTER — Other Ambulatory Visit: Payer: Self-pay | Admitting: Family Medicine

## 2013-08-27 MED ORDER — METFORMIN HCL 1000 MG PO TABS: 1000.0000 mg | ORAL_TABLET | Freq: Two times a day (BID) | ORAL | Status: DC

## 2013-09-14 ENCOUNTER — Other Ambulatory Visit: Payer: Self-pay | Admitting: Gastroenterology

## 2013-09-19 ENCOUNTER — Other Ambulatory Visit: Payer: Self-pay | Admitting: Family Medicine

## 2013-09-20 ENCOUNTER — Ambulatory Visit (INDEPENDENT_AMBULATORY_CARE_PROVIDER_SITE_OTHER): Payer: BC Managed Care – PPO | Admitting: Gastroenterology

## 2013-09-20 ENCOUNTER — Other Ambulatory Visit (INDEPENDENT_AMBULATORY_CARE_PROVIDER_SITE_OTHER): Payer: BC Managed Care – PPO

## 2013-09-20 ENCOUNTER — Encounter: Payer: Self-pay | Admitting: Gastroenterology

## 2013-09-20 VITALS — BP 140/78 | HR 64 | Ht 70.0 in | Wt 238.8 lb

## 2013-09-20 DIAGNOSIS — Z86010 Personal history of colonic polyps: Secondary | ICD-10-CM

## 2013-09-20 DIAGNOSIS — K5100 Ulcerative (chronic) pancolitis without complications: Secondary | ICD-10-CM

## 2013-09-20 LAB — URINALYSIS
BILIRUBIN URINE: NEGATIVE
HGB URINE DIPSTICK: NEGATIVE
Ketones, ur: NEGATIVE
Leukocytes, UA: NEGATIVE
Nitrite: NEGATIVE
Specific Gravity, Urine: 1.02 (ref 1.000–1.030)
Total Protein, Urine: NEGATIVE
UROBILINOGEN UA: 0.2 (ref 0.0–1.0)
Urine Glucose: >=1000 — AB
pH: 5 (ref 5.0–8.0)

## 2013-09-20 LAB — CBC WITH DIFFERENTIAL/PLATELET
Basophils Absolute: 0 10*3/uL (ref 0.0–0.1)
Basophils Relative: 0.7 % (ref 0.0–3.0)
EOS ABS: 0.1 10*3/uL (ref 0.0–0.7)
Eosinophils Relative: 2.2 % (ref 0.0–5.0)
HCT: 43.5 % (ref 39.0–52.0)
Hemoglobin: 14.4 g/dL (ref 13.0–17.0)
Lymphocytes Relative: 34.7 % (ref 12.0–46.0)
Lymphs Abs: 2.1 10*3/uL (ref 0.7–4.0)
MCHC: 33.2 g/dL (ref 30.0–36.0)
MCV: 91.6 fl (ref 78.0–100.0)
MONO ABS: 0.5 10*3/uL (ref 0.1–1.0)
Monocytes Relative: 7.4 % (ref 3.0–12.0)
NEUTROS ABS: 3.4 10*3/uL (ref 1.4–7.7)
NEUTROS PCT: 55 % (ref 43.0–77.0)
Platelets: 204 10*3/uL (ref 150.0–400.0)
RBC: 4.75 Mil/uL (ref 4.22–5.81)
RDW: 13.5 % (ref 11.5–14.6)
WBC: 6.1 10*3/uL (ref 4.5–10.5)

## 2013-09-20 MED ORDER — BALSALAZIDE DISODIUM 750 MG PO CAPS: ORAL_CAPSULE | ORAL | Status: DC

## 2013-09-20 NOTE — Patient Instructions (Signed)
Your physician has requested that you go to the basement for the following lab work before leaving today: CBC, Urinalysis.  We have sent the following medications to your pharmacy for you to pick up at your convenience: Matthew Chen will be due for a recall colonoscopy in 03/2014. We will send you a reminder in the mail when it gets closer to that time.   Thank you for choosing me and [facility].  Pricilla Riffle. Dagoberto Ligas., MD., Marval Regal

## 2013-09-20 NOTE — Progress Notes (Signed)
    History of Present Illness: This is a 58 year old male with a history of universal ulcerative colitis returns for routine followup. He has no activity of his colitis symptoms. Due for colonoscopy in August.  Current Medications, Allergies, Past Medical History, Past Surgical History, Family History and Social History were reviewed in [employer] record.  Physical Exam: General: Well developed , well nourished, no acute distress Head: Normocephalic and atraumatic Eyes:  sclerae anicteric, EOMI Ears: Normal auditory acuity Mouth: No deformity or lesions Lungs: Clear throughout to auscultation Heart: Regular rate and rhythm; no murmurs, rubs or bruits Abdomen: Soft, non tender and non distended. No masses, hepatosplenomegaly or hernias noted. Normal Bowel sounds Musculoskeletal: Symmetrical with no gross deformities  Pulses:  Normal pulses noted Extremities: No clubbing, cyanosis, edema or deformities noted Neurological: Alert oriented x 4, grossly nonfocal Psychological:  Alert and cooperative. Normal mood and affect  Assessment and Recommendations:  1. Ulcerative pancolitis. Continue balsalazide at current dose. Obtain a CBC and UA today. Recent CMET reviewed. We discussed the possibility of a 3 year interval for future colonoscopies if his UC symptoms remain under excellent control and colon biopsies show little or no inflammatory changes.  2. Personal history of adenomatous colon polyps. Colonoscopy as above.

## 2013-12-01 ENCOUNTER — Other Ambulatory Visit: Payer: Self-pay | Admitting: Family Medicine

## 2013-12-02 ENCOUNTER — Other Ambulatory Visit: Payer: Self-pay | Admitting: Family Medicine

## 2014-01-17 ENCOUNTER — Encounter: Payer: Self-pay | Admitting: Family Medicine

## 2014-01-17 ENCOUNTER — Ambulatory Visit (INDEPENDENT_AMBULATORY_CARE_PROVIDER_SITE_OTHER): Payer: BC Managed Care – PPO | Admitting: Family Medicine

## 2014-01-17 VITALS — BP 140/82 | HR 74 | Temp 97.2°F | Resp 18 | Ht 70.0 in | Wt 237.0 lb

## 2014-01-17 DIAGNOSIS — B359 Dermatophytosis, unspecified: Secondary | ICD-10-CM

## 2014-01-17 DIAGNOSIS — E119 Type 2 diabetes mellitus without complications: Secondary | ICD-10-CM

## 2014-01-17 DIAGNOSIS — D485 Neoplasm of uncertain behavior of skin: Secondary | ICD-10-CM

## 2014-01-17 LAB — LIPID PANEL
CHOLESTEROL: 136 mg/dL (ref 0–200)
HDL: 34 mg/dL — AB (ref >39)
LDL Cholesterol: 53 mg/dL (ref 0–99)
Total CHOL/HDL Ratio: 4 Ratio
Triglycerides: 245 mg/dL — ABNORMAL HIGH (ref <150)
VLDL: 49 mg/dL — ABNORMAL HIGH (ref 0–40)

## 2014-01-17 LAB — HEMOGLOBIN A1C
HEMOGLOBIN A1C: 7 % — AB (ref <5.7)
MEAN PLASMA GLUCOSE: 154 mg/dL — AB (ref <117)

## 2014-01-17 LAB — COMPLETE METABOLIC PANEL WITH GFR
ALBUMIN: 4 g/dL (ref 3.5–5.2)
ALK PHOS: 54 U/L (ref 39–117)
ALT: 53 U/L (ref 0–53)
AST: 37 U/L (ref 0–37)
BILIRUBIN TOTAL: 0.5 mg/dL (ref 0.2–1.2)
BUN: 20 mg/dL (ref 6–23)
CO2: 25 mEq/L (ref 19–32)
Calcium: 9.2 mg/dL (ref 8.4–10.5)
Chloride: 107 mEq/L (ref 96–112)
Creat: 1.28 mg/dL (ref 0.50–1.35)
GFR, EST NON AFRICAN AMERICAN: 61 mL/min
GFR, Est African American: 71 mL/min
GLUCOSE: 122 mg/dL — AB (ref 70–99)
Potassium: 4.4 mEq/L (ref 3.5–5.3)
Sodium: 140 mEq/L (ref 135–145)
Total Protein: 6.9 g/dL (ref 6.0–8.3)

## 2014-01-17 LAB — MICROALBUMIN, URINE: MICROALB UR: 0.53 mg/dL (ref 0.00–1.89)

## 2014-01-17 MED ORDER — CLOTRIMAZOLE 1 % EX CREA: 1.0000 "application " | TOPICAL_CREAM | Freq: Two times a day (BID) | CUTANEOUS | Status: DC

## 2014-01-17 NOTE — Progress Notes (Signed)
Subjective:    Patient ID: Matthew Chen, male    DOB: [birth year], 58 y.o.   MRN: [id number]  HPI Patient has a history of hypertension, hyperlipidemia/hypertriglyceridemia, and hypertension. His blood pressures borderline well controlled at 140/82. He is here for fasting lab work. His major concern or 2 lesions on his body. The first lesion is on the top of his head. It is approximately 5 mm in size. It is an erythematous scaly red papule. It appears to be either an irritated SK or AK.  It has been there for approximately one month.  He also has a rash on his left forearm. It is a ring-shaped rash with a raised rolled border and central clearing. It is proximally 2 cm in diameter. It appears to be ringworm. KOH today was positive. Past Medical History  Diagnosis Date  . Ulcerative colitis   . Hemorrhoids   . Diabetes mellitus   . Hyperlipidemia   . Hypertension   . Adenomatous colon polyp 03/2008   Current Outpatient Prescriptions on File Prior to Visit  Medication Sig Dispense Refill  . balsalazide (COLAZAL) 750 MG capsule TAKE 3 CAPSULES BY MOUTH 3 TIMES A DAY  270 capsule  5  . benazepril (LOTENSIN) 40 MG tablet TAKE 1 TABLET BY MOUTH EVERY DAY  30 tablet  1  . fenofibrate 160 MG tablet TAKE 1 TABLET BY MOUTH EVERY DAY  30 tablet  5  . glipiZIDE (GLUCOTROL) 10 MG tablet TAKE 1 TABLET BY MOUTH EVERY DAY  30 tablet  5  . hydrochlorothiazide (HYDRODIURIL) 25 MG tablet TAKE 1 TABLET BY MOUTH EVERY DAY  30 tablet  5  . metFORMIN (GLUCOPHAGE) 1000 MG tablet Take 1 tablet (1,000 mg total) by mouth 2 (two) times daily with a meal.  60 tablet  5  . Probiotic Product (ALIGN) 4 MG CAPS Take 1 capsule by mouth daily.  30 capsule  0  . simvastatin (ZOCOR) 20 MG tablet TAKE 1 TABLET BY MOUTH EVERY EVENING  30 tablet  5   No current facility-administered medications on file prior to visit.   Allergies  Allergen Reactions  . Penicillins     unsure   History   Social History  . Marital Status:  Married    Spouse Name: N/A    Number of Children: 2  . Years of Education: N/A   Occupational History  . Truck driver   .     Social History Main Topics  . Smoking status: Former Smoker    Types: Cigarettes    Quit date: 08/12/1996  . Smokeless tobacco: Former Systems developer    Types: Chew    Quit date: 08/12/1996  . Alcohol Use: No     Comment: occasionally  . Drug Use: No  . Sexual Activity: Not on file   Other Topics Concern  . Not on file   Social History Narrative  . No narrative on file      Review of Systems  All other systems reviewed and are negative.      Objective:   Physical Exam  Vitals reviewed. Cardiovascular: Normal rate, regular rhythm and normal heart sounds.   Pulmonary/Chest: Effort normal and breath sounds normal.  Abdominal: Soft. Bowel sounds are normal. He exhibits no distension. There is no tenderness. There is no rebound.  Skin: Rash noted. There is erythema.          Assessment & Plan:  1. Neoplasm of uncertain behavior of skin Cryotherapy using liquid nitrogen  was applied to the lesion on the top of his head for approximately 25 seconds.  Wound care was discussed. If lesion persists after 3-4 weeks I recommend shave biopsy  2. Ringworm Lotrimin cream applied twice a day for 14-21 days. Recheck if no better.  3. Type II or unspecified type diabetes mellitus without mention of complication, not stated as uncontrolled This was not primary focus of his office visit but I will obtain fasting lab work while patient is here. - COMPLETE METABOLIC PANEL WITH GFR - Lipid panel - Hemoglobin A1c - Microalbumin, urine

## 2014-01-20 ENCOUNTER — Encounter: Payer: Self-pay | Admitting: Family Medicine

## 2014-01-21 ENCOUNTER — Encounter: Payer: Self-pay | Admitting: Gastroenterology

## 2014-02-08 ENCOUNTER — Telehealth: Payer: Self-pay | Admitting: *Deleted

## 2014-02-08 MED ORDER — FLUCONAZOLE 150 MG PO TABS: ORAL_TABLET | ORAL | Status: DC

## 2014-02-08 NOTE — Telephone Encounter (Signed)
Message copied by Sheral Flow on Tue Feb 08, 2014  9:33 AM ------      Message from: Jenna Luo      Created: Tue Feb 08, 2014  7:22 AM       Temporarily stop cholesterol meds.      Start diflucan 150 mg po every other day for 2 weeks.  Resume cholesterol meds after he stops diflucan.  Continue lotrimin cream as well.  NTBS if no better after this.      ----- Message -----         From: Alyson Locket         Sent: 02/07/2014   2:39 PM           To: Susy Frizzle, MD                        ----- Message -----         From: Devoria Glassing         Sent: 02/07/2014   2:02 PM           To: Alyson Locket            [phone]      Patient was in for ringworm, he says its no better would like to know what dr pickard would like to do        ------

## 2014-02-08 NOTE — Telephone Encounter (Signed)
Call placed to patient and patient made aware.   Prescription sent to pharmacy.  

## 2014-03-06 ENCOUNTER — Other Ambulatory Visit: Payer: Self-pay | Admitting: Family Medicine

## 2014-03-09 ENCOUNTER — Telehealth: Payer: Self-pay | Admitting: Family Medicine

## 2014-03-09 NOTE — Telephone Encounter (Signed)
Patient says that the med that we gave him for ringworm is not working would like to know if we can call something else in for him  [facility]

## 2014-03-09 NOTE — Telephone Encounter (Signed)
Per last phone note pt NTBS if medication no help. Pt aware and will call back to schedule appt.

## 2014-03-17 ENCOUNTER — Ambulatory Visit (INDEPENDENT_AMBULATORY_CARE_PROVIDER_SITE_OTHER): Payer: BC Managed Care – PPO | Admitting: Family Medicine

## 2014-03-17 ENCOUNTER — Encounter: Payer: Self-pay | Admitting: Family Medicine

## 2014-03-17 VITALS — BP 130/80 | HR 84 | Temp 97.8°F | Resp 16 | Wt 225.0 lb

## 2014-03-17 DIAGNOSIS — D485 Neoplasm of uncertain behavior of skin: Secondary | ICD-10-CM

## 2014-03-17 NOTE — Progress Notes (Signed)
Subjective:    Patient ID: Matthew Chen, male    DOB: 1956-08-11, 58 y.o.   MRN: [id number]  HPI 01/17/14 Patient has a history of hypertension, hyperlipidemia/hypertriglyceridemia, and hypertension. His blood pressures borderline well controlled at 140/82. He is here for fasting lab work. His major concern or 2 lesions on his body. The first lesion is on the top of his head. It is approximately 5 mm in size. It is an erythematous scaly red papule. It appears to be either an irritated SK or AK.  It has been there for approximately one month.  He also has a rash on his left forearm. It is a ring-shaped rash with a raised rolled border and central clearing. It is proximally 2 cm in diameter. It appears to be ringworm. KOH today was positive.  At that time, my plan was: 1. Neoplasm of uncertain behavior of skin Cryotherapy using liquid nitrogen was applied to the lesion on the top of his head for approximately 25 seconds.  Wound care was discussed. If lesion persists after 3-4 weeks I recommend shave biopsy  2. Ringworm Lotrimin cream applied twice a day for 14-21 days. Recheck if no better.  3. Type II or unspecified type diabetes mellitus without mention of complication, not stated as uncontrolled This was not primary focus of his office visit but I will obtain fasting lab work while patient is here. - COMPLETE METABOLIC PANEL WITH GFR - Lipid panel - Hemoglobin A1c - Microalbumin, urine  03/17/14 Patient returns today. The rash on his left forearm is no better. He tried 2 weeks of Lotrimin without improvement. We then tried Diflucan every other day for 2 weeks without improvement. He is here today for reevaluation. He has a 3 cm red-gray and on his left forearm with raised red edges and central clearing. It has the characteristic appearance of tinea vs granuloma annulare.  Past Medical History  Diagnosis Date  . Ulcerative colitis   . Hemorrhoids   . Diabetes mellitus   . Hyperlipidemia   .  Hypertension   . Adenomatous colon polyp 03/2008   Current Outpatient Prescriptions on File Prior to Visit  Medication Sig Dispense Refill  . balsalazide (COLAZAL) 750 MG capsule TAKE 3 CAPSULES BY MOUTH 3 TIMES A DAY  270 capsule  5  . benazepril (LOTENSIN) 40 MG tablet TAKE 1 TABLET BY MOUTH EVERY DAY  30 tablet  1  . clotrimazole (LOTRIMIN) 1 % cream Apply 1 application topically 2 (two) times daily. X 14-21 days  45 g  0  . fenofibrate 160 MG tablet TAKE 1 TABLET BY MOUTH EVERY DAY  30 tablet  5  . fluconazole (DIFLUCAN) 150 MG tablet Take (1) tablet by mouth every other day x2 weeks.  7 tablet  0  . glipiZIDE (GLUCOTROL) 10 MG tablet TAKE 1 TABLET BY MOUTH EVERY DAY  30 tablet  5  . hydrochlorothiazide (HYDRODIURIL) 25 MG tablet TAKE 1 TABLET BY MOUTH EVERY DAY  30 tablet  5  . metFORMIN (GLUCOPHAGE) 1000 MG tablet TAKE 1 TABLET BY MOUTH TWICE A DAY WITH A MEAL  60 tablet  5  . Probiotic Product (ALIGN) 4 MG CAPS Take 1 capsule by mouth daily.  30 capsule  0  . simvastatin (ZOCOR) 20 MG tablet TAKE 1 TABLET BY MOUTH EVERY EVENING  30 tablet  5   No current facility-administered medications on file prior to visit.   Allergies  Allergen Reactions  . Penicillins  unsure   History   Social History  . Marital Status: Married    Spouse Name: N/A    Number of Children: 2  . Years of Education: N/A   Occupational History  . Truck driver   .     Social History Main Topics  . Smoking status: Former Smoker    Types: Cigarettes    Quit date: 08/12/1996  . Smokeless tobacco: Former Systems developer    Types: Chew    Quit date: 08/12/1996  . Alcohol Use: No     Comment: occasionally  . Drug Use: No  . Sexual Activity: Not on file   Other Topics Concern  . Not on file   Social History Narrative  . No narrative on file      Review of Systems  All other systems reviewed and are negative.      Objective:   Physical Exam  Vitals reviewed. Cardiovascular: Normal rate, regular  rhythm and normal heart sounds.   Pulmonary/Chest: Effort normal and breath sounds normal.  Abdominal: Soft. Bowel sounds are normal. He exhibits no distension. There is no tenderness. There is no rebound.  Skin: Rash noted. There is erythema.          Assessment & Plan:   1. Neoplasm of uncertain behavior of skin Differential diagnosis includes tinea corporis vs granuloma annulare.  I anesthetized the lesion was 0.1% lidocaine with epinephrine. I then performed a 4 mm punch biopsy using sterile technique. Lesion was sent to pathology and labeled container. One was closed with one simple 3-0 Ethilon suture. Stitches out in one week. - Pathology Report

## 2014-03-21 LAB — PATHOLOGY

## 2014-04-09 ENCOUNTER — Other Ambulatory Visit: Payer: Self-pay | Admitting: Gastroenterology

## 2014-05-18 ENCOUNTER — Other Ambulatory Visit: Payer: Self-pay | Admitting: Gastroenterology

## 2014-07-25 ENCOUNTER — Encounter: Payer: Self-pay | Admitting: Family Medicine

## 2014-07-25 ENCOUNTER — Ambulatory Visit (INDEPENDENT_AMBULATORY_CARE_PROVIDER_SITE_OTHER): Payer: BC Managed Care – PPO | Admitting: Family Medicine

## 2014-07-25 VITALS — BP 130/82 | HR 76 | Temp 98.4°F | Resp 16 | Ht 70.0 in | Wt 231.0 lb

## 2014-07-25 DIAGNOSIS — E119 Type 2 diabetes mellitus without complications: Secondary | ICD-10-CM

## 2014-07-25 DIAGNOSIS — I10 Essential (primary) hypertension: Secondary | ICD-10-CM

## 2014-07-25 DIAGNOSIS — E785 Hyperlipidemia, unspecified: Secondary | ICD-10-CM

## 2014-07-25 LAB — HEMOGLOBIN A1C
HEMOGLOBIN A1C: 6.6 % — AB (ref <5.7)
Mean Plasma Glucose: 143 mg/dL — ABNORMAL HIGH (ref <117)

## 2014-07-25 LAB — COMPLETE METABOLIC PANEL WITH GFR
ALK PHOS: 61 U/L (ref 39–117)
ALT: 53 U/L (ref 0–53)
AST: 37 U/L (ref 0–37)
Albumin: 4.2 g/dL (ref 3.5–5.2)
BILIRUBIN TOTAL: 0.6 mg/dL (ref 0.2–1.2)
BUN: 18 mg/dL (ref 6–23)
CO2: 24 mEq/L (ref 19–32)
CREATININE: 1.36 mg/dL — AB (ref 0.50–1.35)
Calcium: 9.6 mg/dL (ref 8.4–10.5)
Chloride: 107 mEq/L (ref 96–112)
GFR, EST NON AFRICAN AMERICAN: 57 mL/min — AB
GFR, Est African American: 66 mL/min
Glucose, Bld: 145 mg/dL — ABNORMAL HIGH (ref 70–99)
Potassium: 4.5 mEq/L (ref 3.5–5.3)
Sodium: 137 mEq/L (ref 135–145)
TOTAL PROTEIN: 7.1 g/dL (ref 6.0–8.3)

## 2014-07-25 LAB — LIPID PANEL
CHOL/HDL RATIO: 3.8 ratio
Cholesterol: 133 mg/dL (ref 0–200)
HDL: 35 mg/dL — AB (ref >39)
LDL Cholesterol: 57 mg/dL (ref 0–99)
TRIGLYCERIDES: 204 mg/dL — AB (ref <150)
VLDL: 41 mg/dL — ABNORMAL HIGH (ref 0–40)

## 2014-07-25 LAB — MICROALBUMIN, URINE: MICROALB UR: 0.6 mg/dL (ref <2.0)

## 2014-07-25 NOTE — Progress Notes (Signed)
Subjective:    Patient ID: Matthew Chen, male    DOB: [birth year], 58 y.o.   MRN: [id number]  HPI Patient is a very pleasant 58 year old white male who is here today for follow-up of his multiple medical conditions. He is overdue for an annual ophthalmologic exam. He is also not taking an aspirin regularly. Patient's diabetic foot exam was performed today and was normal. He is currently on glipizide And metformin for diabetes.  He is not checking his sugar. However he denies neuropathy in his feet. He denies blurry vision, polyuria, polydipsia.  He denies chest pain shortness of breath or dyspnea on exertion. His blood pressures well controlled today at 130/82. He denies myalgias or right upper quadrant pain on the combination of simvastatin and fenofibrate. He is overdue for a fasting lipid panel. Patient received his flu vaccine today in clinic. Past Medical History  Diagnosis Date  . Ulcerative colitis   . Hemorrhoids   . Diabetes mellitus   . Hyperlipidemia   . Hypertension   . Adenomatous colon polyp 03/2008   Past Surgical History  Procedure Laterality Date  . Tonsillectomy    . Skin surgery      head - birthmark removal  . Colonoscopy    . Polypectomy     Current Outpatient Prescriptions on File Prior to Visit  Medication Sig Dispense Refill  . balsalazide (COLAZAL) 750 MG capsule TAKE 3 CAPSULES BY MOUTH 3 TIMES A DAY 270 capsule 2  . benazepril (LOTENSIN) 40 MG tablet TAKE 1 TABLET BY MOUTH EVERY DAY 30 tablet 1  . clotrimazole (LOTRIMIN) 1 % cream Apply 1 application topically 2 (two) times daily. X 14-21 days 45 g 0  . fenofibrate 160 MG tablet TAKE 1 TABLET BY MOUTH EVERY DAY 30 tablet 5  . glipiZIDE (GLUCOTROL) 10 MG tablet TAKE 1 TABLET BY MOUTH EVERY DAY 30 tablet 5  . hydrochlorothiazide (HYDRODIURIL) 25 MG tablet TAKE 1 TABLET BY MOUTH EVERY DAY 30 tablet 5  . metFORMIN (GLUCOPHAGE) 1000 MG tablet TAKE 1 TABLET BY MOUTH TWICE A DAY WITH A MEAL 60 tablet 5  .  simvastatin (ZOCOR) 20 MG tablet TAKE 1 TABLET BY MOUTH EVERY EVENING 30 tablet 5   No current facility-administered medications on file prior to visit.   Allergies  Allergen Reactions  . Penicillins     unsure   History   Social History  . Marital Status: Married    Spouse Name: N/A    Number of Children: 2  . Years of Education: N/A   Occupational History  . Truck driver   .     Social History Main Topics  . Smoking status: Former Smoker    Types: Cigarettes    Quit date: 08/12/1996  . Smokeless tobacco: Former Systems developer    Types: Chew    Quit date: 08/12/1996  . Alcohol Use: No     Comment: occasionally  . Drug Use: No  . Sexual Activity: Not on file   Other Topics Concern  . Not on file   Social History Narrative     Review of Systems  All other systems reviewed and are negative.      Objective:   Physical Exam  Constitutional: He appears well-developed and well-nourished.  HENT:  Right Ear: External ear normal.  Left Ear: External ear normal.  Nose: Nose normal.  Mouth/Throat: Oropharynx is clear and moist.  Eyes: Conjunctivae are normal.  Neck: Neck supple. No thyromegaly present.  Cardiovascular: Normal  rate, regular rhythm, normal heart sounds and intact distal pulses.  Exam reveals no gallop and no friction rub.   No murmur heard. Pulmonary/Chest: Effort normal and breath sounds normal. No respiratory distress. He has no wheezes. He has no rales.  Abdominal: Soft. Bowel sounds are normal. He exhibits no distension. There is no tenderness. There is no rebound and no guarding.  Musculoskeletal: He exhibits no edema.  Lymphadenopathy:    He has no cervical adenopathy.  Vitals reviewed.         Assessment & Plan:  Diabetes mellitus type II, controlled - Plan: COMPLETE METABOLIC PANEL WITH GFR, Lipid panel, Hemoglobin A1c, Microalbumin, urine  Dyslipidemia  Essential hypertension  Patient's blood pressures well controlled. I will make no  changes in his lisinopril dose at the present time. I will check a hemoglobin A1c. Goal hemoglobin A1c is less than 6.5. If the patient's hemoglobin A1c is above his goal, I would recommend starting the patient on Januvia. I would also check a fasting lipid panel. Goal LDL cholesterol is less than 100. I would also like to see his triglycerides less than 200 if possible and his HDL greater than 40. Patient did receive a flu shot today. Also recommended he start aspirin 81 mg by mouth daily. I recommended a diabetic eye exam. The patient deferred this for now and will schedule this once he gets vision insurance next year.

## 2014-09-05 ENCOUNTER — Other Ambulatory Visit: Payer: Self-pay | Admitting: Family Medicine

## 2014-09-17 ENCOUNTER — Other Ambulatory Visit: Payer: Self-pay | Admitting: Family Medicine

## 2014-10-05 ENCOUNTER — Encounter: Payer: Self-pay | Admitting: Physician Assistant

## 2014-10-05 ENCOUNTER — Ambulatory Visit (INDEPENDENT_AMBULATORY_CARE_PROVIDER_SITE_OTHER): Payer: BLUE CROSS/BLUE SHIELD | Admitting: Physician Assistant

## 2014-10-05 VITALS — BP 170/98 | HR 74 | Temp 98.4°F | Resp 16 | Ht 70.0 in | Wt 230.0 lb

## 2014-10-05 DIAGNOSIS — I10 Essential (primary) hypertension: Secondary | ICD-10-CM | POA: Insufficient documentation

## 2014-10-05 MED ORDER — AMLODIPINE BESYLATE 5 MG PO TABS: 5.0000 mg | ORAL_TABLET | Freq: Every day | ORAL | Status: DC

## 2014-10-05 NOTE — Progress Notes (Signed)
Patient ID: Matthew Chen MRN: [id number], DOB: [birth year], 59 y.o. Date of Encounter: 10/05/2014, 2:56 PM    Chief Complaint:  Chief Complaint  Patient presents with  . Elevated BP     HPI: 59 y.o. 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 Says that every year he starts checking his blood pressure prior to that DOT physical to make sure it's well-controlled because he says every year he has this problem with his blood pressure being high and needing to get it controlled prior to the DOT physical. Says that he just bought a new blood pressure machine. Checked his blood pressure with that machine and got 190/109. He then rechecked it with another machine but was still getting higher reading. Therefore he came here for blood pressure check with [place]. Blood pressure here 170/98 so he is added to my schedule.  He is having no discomfort in his chest. No increased shortness of breath or dyspnea on exertion. No neurologic changes or weakness in any extremity.  He states that he is taking his benazepril 40 mg and HCTZ 25 mg daily. He reports that he is having no increased amount of stress compared to usual. Has not eaten increased amount of sodium compared to usual.  I Have reviewed at prior office visit here and also at his GI and BP readings have all been good at those visits.      Home Meds:   Outpatient Prescriptions Prior to Visit  Medication Sig Dispense Refill  . balsalazide (COLAZAL) 750 MG capsule TAKE 3 CAPSULES BY MOUTH 3 TIMES A DAY 270 capsule 2  . benazepril (LOTENSIN) 40 MG tablet TAKE 1 TABLET BY MOUTH EVERY DAY 30 tablet 1  . clotrimazole (LOTRIMIN) 1 % cream Apply 1 application topically 2 (two) times daily. X 14-21 days 45 g 0  . fenofibrate 160 MG tablet TAKE 1 TABLET BY MOUTH EVERY DAY 30 tablet 5  . glipiZIDE (GLUCOTROL) 10 MG tablet TAKE 1 TABLET BY MOUTH EVERY DAY 30 tablet 5  . hydrochlorothiazide (HYDRODIURIL) 25 MG tablet TAKE 1  TABLET BY MOUTH EVERY DAY 30 tablet 5  . metFORMIN (GLUCOPHAGE) 1000 MG tablet TAKE 1 TABLET BY MOUTH TWICE A DAY WITH A MEAL 60 tablet 5  . simvastatin (ZOCOR) 20 MG tablet TAKE 1 TABLET BY MOUTH EVERY EVENING 30 tablet 5  . glipiZIDE (GLUCOTROL) 10 MG tablet TAKE 1 TABLET BY MOUTH EVERY DAY 30 tablet 6   No facility-administered medications prior to visit.    Allergies:  Allergies  Allergen Reactions  . Penicillins     unsure      Review of Systems: See HPI for pertinent ROS. All other ROS negative.    Physical Exam: Blood pressure 170/98, pulse 74, temperature 98.4 F (36.9 C), temperature source Oral, resp. rate 16, height 5' 10"  (1.778 m), weight 230 lb (104.327 kg)., Body mass index is 33 kg/(m^2). General:  WNWD WM. Appears in no acute distress. Neck: Supple. No thyromegaly. No lymphadenopathy. Lungs: Clear bilaterally to auscultation without wheezes, rales, or rhonchi. Breathing is unlabored. Heart: Regular rhythm. No murmurs, rubs, or gallops. Msk:  Strength and tone normal for age. Extremities/Skin: Warm and dry. Neuro: Alert and oriented X 3. Moves all extremities spontaneously. Gait is normal. CNII-XII grossly in tact. Psych:  Responds to questions appropriately with a normal affect.     ASSESSMENT AND PLAN:  59 y.o. year old male with  1.  Essential hypertension Continue benazepril 40 mg daily. Continue HCTZ 25 mg daily. Add Norvasc 5 mg daily.--- Even though getting higher reading today, will not add higher dose of medication right now-- as all prior readings have been good. Will have him go ahead and return for follow-up in one week as he wants to make sure he gets his blood pressure controlled prior to the DOT physical. - amLODipine (NORVASC) 5 MG tablet; Take 1 tablet (5 mg total) by mouth daily.  Dispense: 30 tablet; Refill: 2   Signed, [address] [place], [place], [facility] 10/05/2014 2:56 PM

## 2014-10-13 ENCOUNTER — Ambulatory Visit: Payer: BLUE CROSS/BLUE SHIELD | Admitting: Family Medicine

## 2014-10-14 ENCOUNTER — Encounter: Payer: Self-pay | Admitting: Family Medicine

## 2014-10-14 ENCOUNTER — Ambulatory Visit (INDEPENDENT_AMBULATORY_CARE_PROVIDER_SITE_OTHER): Payer: BLUE CROSS/BLUE SHIELD | Admitting: Family Medicine

## 2014-10-14 VITALS — BP 168/90 | HR 76 | Temp 98.2°F | Resp 18 | Ht 70.0 in | Wt 231.0 lb

## 2014-10-14 DIAGNOSIS — I10 Essential (primary) hypertension: Secondary | ICD-10-CM | POA: Diagnosis not present

## 2014-10-14 MED ORDER — CLONIDINE HCL 0.1 MG PO TABS: 0.1000 mg | ORAL_TABLET | Freq: Three times a day (TID) | ORAL | Status: DC

## 2014-10-14 NOTE — Progress Notes (Signed)
Subjective:    Patient ID: Matthew Chen, male    DOB: [birth year], 59 y.o.   MRN: [id number]  HPI I reviewed his last office visit with my physician assistant Olean Ree.  At that time, she added amlodipine 5 mg a day to address his hypertension. Unfortunately his blood pressure does not seem to be responding. Patient has a DOT physical next week and needs to get the blood pressure under control. He denies any chest pain shortness of breath or dyspnea on exertion. Furthermore I am concerned about amlodipine given the fact he is taking simvastatin coupled with fenofibrate. The risk of rhabdomyolysis will be slightly higher with this combination including amlodipine. Past Medical History  Diagnosis Date  . Ulcerative colitis   . Hemorrhoids   . Diabetes mellitus   . Hyperlipidemia   . Hypertension   . Adenomatous colon polyp 03/2008   Past Surgical History  Procedure Laterality Date  . Tonsillectomy    . Skin surgery      head - birthmark removal  . Colonoscopy    . Polypectomy     Current Outpatient Prescriptions on File Prior to Visit  Medication Sig Dispense Refill  . amLODipine (NORVASC) 5 MG tablet Take 1 tablet (5 mg total) by mouth daily. 30 tablet 2  . balsalazide (COLAZAL) 750 MG capsule TAKE 3 CAPSULES BY MOUTH 3 TIMES A DAY 270 capsule 2  . benazepril (LOTENSIN) 40 MG tablet TAKE 1 TABLET BY MOUTH EVERY DAY 30 tablet 1  . clotrimazole (LOTRIMIN) 1 % cream Apply 1 application topically 2 (two) times daily. X 14-21 days 45 g 0  . fenofibrate 160 MG tablet TAKE 1 TABLET BY MOUTH EVERY DAY 30 tablet 5  . glipiZIDE (GLUCOTROL) 10 MG tablet TAKE 1 TABLET BY MOUTH EVERY DAY 30 tablet 5  . hydrochlorothiazide (HYDRODIURIL) 25 MG tablet TAKE 1 TABLET BY MOUTH EVERY DAY 30 tablet 5  . metFORMIN (GLUCOPHAGE) 1000 MG tablet TAKE 1 TABLET BY MOUTH TWICE A DAY WITH A MEAL 60 tablet 5  . simvastatin (ZOCOR) 20 MG tablet TAKE 1 TABLET BY MOUTH EVERY EVENING 30 tablet 5   No current  facility-administered medications on file prior to visit.   Allergies  Allergen Reactions  . Penicillins     unsure   History   Social History  . Marital Status: Married    Spouse Name: N/A  . Number of Children: 2  . Years of Education: N/A   Occupational History  . Truck driver   .     Social History Main Topics  . Smoking status: Former Smoker    Types: Cigarettes    Quit date: 08/12/1996  . Smokeless tobacco: Former Systems developer    Types: Chew    Quit date: 08/12/1996  . Alcohol Use: No     Comment: occasionally  . Drug Use: No  . Sexual Activity: Not on file   Other Topics Concern  . Not on file   Social History Narrative      Review of Systems  All other systems reviewed and are negative.      Objective:   Physical Exam  Cardiovascular: Normal rate, regular rhythm and normal heart sounds.   No murmur heard. Pulmonary/Chest: Effort normal and breath sounds normal. No respiratory distress. He has no wheezes. He has no rales.  Abdominal: Soft. Bowel sounds are normal.  Musculoskeletal: He exhibits no edema.  Vitals reviewed.         Assessment &  Plan:  Benign essential HTN - Plan: cloNIDine (CATAPRES) 0.1 MG tablet  Discontinue amlodipine and start the patient on clonidine 0.1 mg by mouth twice a day. Next week if not at goal increase clonidine to 0.1 mg by mouth 3 times a day. Goal blood pressure is less than 140/90.

## 2014-10-15 ENCOUNTER — Other Ambulatory Visit: Payer: Self-pay | Admitting: Family Medicine

## 2014-10-20 ENCOUNTER — Ambulatory Visit: Payer: BLUE CROSS/BLUE SHIELD | Admitting: *Deleted

## 2014-10-20 DIAGNOSIS — I10 Essential (primary) hypertension: Secondary | ICD-10-CM

## 2014-10-20 MED ORDER — CLONIDINE HCL 0.2 MG PO TABS: 0.2000 mg | ORAL_TABLET | Freq: Three times a day (TID) | ORAL | Status: DC

## 2014-10-20 NOTE — Progress Notes (Signed)
Patient ID: Matthew Chen, male   DOB: July 11, 1956, 59 y.o.   MRN: [id number] Patient seen in office today to have BP monitored. Reading noted at 176/98. MD made aware and home readings reviewed. New orders obtained to increased Clinidine to 0.19m PO TID and return to office on Monday 10/24/2014 to repeat BP check. Patient verbalized understanding.

## 2014-10-20 NOTE — Patient Instructions (Signed)
F/U on Monday, 10/24/2014 to have BP re-checked.

## 2014-10-23 ENCOUNTER — Other Ambulatory Visit: Payer: Self-pay | Admitting: Gastroenterology

## 2014-10-24 ENCOUNTER — Other Ambulatory Visit: Payer: Self-pay | Admitting: Family Medicine

## 2014-10-24 MED ORDER — NEBIVOLOL HCL 10 MG PO TABS: 10.0000 mg | ORAL_TABLET | Freq: Every day | ORAL | Status: DC

## 2014-10-24 NOTE — Telephone Encounter (Signed)
NEEDS OFFICE VISIT FOR ANY FURTHER REFILLS! 

## 2014-10-24 NOTE — Progress Notes (Signed)
BP elevated today 160/80 right, 180/80 left.  Added bystolic 10 mg poqday and recheck in 1 week

## 2014-10-31 ENCOUNTER — Encounter: Payer: Self-pay | Admitting: Family Medicine

## 2014-10-31 ENCOUNTER — Ambulatory Visit: Payer: BLUE CROSS/BLUE SHIELD | Admitting: *Deleted

## 2014-10-31 ENCOUNTER — Ambulatory Visit (INDEPENDENT_AMBULATORY_CARE_PROVIDER_SITE_OTHER): Payer: BLUE CROSS/BLUE SHIELD | Admitting: Family Medicine

## 2014-10-31 VITALS — BP 170/80

## 2014-10-31 VITALS — BP 156/90

## 2014-10-31 DIAGNOSIS — I10 Essential (primary) hypertension: Secondary | ICD-10-CM

## 2014-10-31 DIAGNOSIS — G4700 Insomnia, unspecified: Secondary | ICD-10-CM

## 2014-10-31 MED ORDER — ALPRAZOLAM 1 MG PO TABS: 1.0000 mg | ORAL_TABLET | Freq: Every evening | ORAL | Status: DC | PRN

## 2014-10-31 NOTE — Progress Notes (Signed)
   Subjective:    Patient ID: Matthew Chen, male    DOB: [birth year], 59 y.o.   MRN: [id number]  HPI  Patient returned to my office after lunch. Patient took Xanax 1 mg at lunch time to help calm his anxiety. He presented to my office at approximately 3:00 to have his blood pressure rechecked now that he is calmer and less concerned. I personally checked the patient's blood pressure myself and it was 138/66. I recommended to the patient that he take a Xanax 1 mg 2 hours prior to his DOT physical and then go to his DOT physical. Once he passes his DOT physical and we have removed anxiety as a confounding factor, I would recheck his blood pressure. I anticipate that we will need to discontinue clonidine and possibly diastolic. However his blood pressure remains elevated then it is not simply anxiety due to the fear of losing his job. At that point I would pursue a renal artery ultrasound to evaluate for renal artery stenosis as well as a sleep study to evaluate for obstructive sleep apnea.  Review of Systems     Objective:   Physical Exam        Assessment & Plan:

## 2014-10-31 NOTE — Progress Notes (Signed)
Subjective:    Patient ID: Matthew Chen, male    DOB: 02-10-1956, 59 y.o.   MRN: [id number]  HPI Please see the recent nurse visits. 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 He is extremely anxious and worried about this. I have started the patient on clonidine 0.2 mg by mouth twice a day as well as bystolic 10 mg by mouth daily.  However his blood pressure is not responding at all to these medication. I truly believe he is so anxious and so worried about passing his DOT physical that his blood pressures artificially elevated due to anxiety. The patient agrees, he cannot even sleep at night.  He is waking up several times at night due to fear over losing his job. He is almost having panic attacks. I believe that if we give him further blood pressure medication we may cause dangerous hypotension once his anxiety improves. I really believe his blood pressure now is almost a pseudo-white coat syndrome due to fear of losing his job. Past Medical History  Diagnosis Date  . Ulcerative colitis   . Hemorrhoids   . Diabetes mellitus   . Hyperlipidemia   . Hypertension   . Adenomatous colon polyp 03/2008   Past Surgical History  Procedure Laterality Date  . Tonsillectomy    . Skin surgery      head - birthmark removal  . Colonoscopy    . Polypectomy     Current Outpatient Prescriptions on File Prior to Visit  Medication Sig Dispense Refill  . amLODipine (NORVASC) 5 MG tablet Take 1 tablet (5 mg total) by mouth daily. 30 tablet 2  . balsalazide (COLAZAL) 750 MG capsule TAKE 3 CAPSULES BY MOUTH 3 TIMES A DAY 270 capsule 2  . balsalazide (COLAZAL) 750 MG capsule TAKE 3 CAPSULES BY MOUTH 3 TIMES A DAY 270 capsule 0  . benazepril (LOTENSIN) 40 MG tablet TAKE 1 TABLET BY MOUTH EVERY DAY 30 tablet 1  . cloNIDine (CATAPRES) 0.2 MG tablet Take 1 tablet (0.2 mg total) by mouth 3 (three) times daily. 90 tablet 0  . clotrimazole (LOTRIMIN) 1 % cream Apply 1 application topically 2  (two) times daily. X 14-21 days 45 g 0  . fenofibrate 160 MG tablet TAKE 1 TABLET BY MOUTH EVERY DAY 30 tablet 5  . glipiZIDE (GLUCOTROL) 10 MG tablet TAKE 1 TABLET BY MOUTH EVERY DAY 30 tablet 5  . glipiZIDE (GLUCOTROL) 10 MG tablet TAKE 1 TABLET BY MOUTH EVERY DAY 30 tablet 5  . hydrochlorothiazide (HYDRODIURIL) 25 MG tablet TAKE 1 TABLET BY MOUTH EVERY DAY 30 tablet 5  . metFORMIN (GLUCOPHAGE) 1000 MG tablet TAKE 1 TABLET BY MOUTH TWICE A DAY WITH A MEAL 60 tablet 5  . metFORMIN (GLUCOPHAGE) 1000 MG tablet TAKE 1 TABLET BY MOUTH TWICE A DAY WITH A MEAL 60 tablet 5  . nebivolol (BYSTOLIC) 10 MG tablet Take 1 tablet (10 mg total) by mouth daily. 30 tablet 1  . simvastatin (ZOCOR) 20 MG tablet TAKE 1 TABLET BY MOUTH EVERY EVENING 30 tablet 5   No current facility-administered medications on file prior to visit.   Allergies  Allergen Reactions  . Penicillins     unsure   History   Social History  . Marital Status: Married    Spouse Name: N/A  . Number of Children: 2  . Years of Education: N/A   Occupational History  . Truck driver   .     Social  History Main Topics  . Smoking status: Former Smoker    Types: Cigarettes    Quit date: 08/12/1996  . Smokeless tobacco: Former Systems developer    Types: Chew    Quit date: 08/12/1996  . Alcohol Use: No     Comment: occasionally  . Drug Use: No  . Sexual Activity: Not on file   Other Topics Concern  . Not on file   Social History Narrative      Review of Systems  All other systems reviewed and are negative.      Objective:   Physical Exam  Cardiovascular: Normal rate, regular rhythm and normal heart sounds.   Pulmonary/Chest: Effort normal and breath sounds normal. No respiratory distress. He has no wheezes. He has no rales. He exhibits no tenderness.  Abdominal: Soft. Bowel sounds are normal.  Vitals reviewed.         Assessment & Plan:  Insomnia - Plan: ALPRAZolam (XANAX) 1 MG tablet  I will give the patient Xanax 1  mg by mouth daily at bedtime as needed for anxiety or insomnia. I believe that if the sleeping better this may actually help lower his blood pressure by calming his anxiety. Once we document that his blood pressure has improved by controlling his anxiety and allowing him to sleep better, I may even discontinue some of his other blood pressure medication. If his blood pressure consistently remains elevated even after managing his anxiety better however, I will proceed with workup for renal artery stenosis as well as obstructive sleep apnea.

## 2014-11-02 ENCOUNTER — Ambulatory Visit (INDEPENDENT_AMBULATORY_CARE_PROVIDER_SITE_OTHER): Payer: Self-pay | Admitting: Emergency Medicine

## 2014-11-02 VITALS — BP 140/80 | HR 70 | Temp 97.8°F | Resp 20 | Ht 69.75 in | Wt 233.0 lb

## 2014-11-02 DIAGNOSIS — Z029 Encounter for administrative examinations, unspecified: Secondary | ICD-10-CM

## 2014-11-02 NOTE — Progress Notes (Signed)
[facility] [address] [place] [address] [phone]  Date:  11/02/2014   Name:  Matthew Chen   DOB:  02/22/1956   MRN:  [id number]  PCP:  No PCP Per Patient    Chief Complaint: Follow-up   History of Present Illness:  Matthew Chen is a 60 y.o. very pleasant male patient who presents with the following:  Comes for extension of DOT card   There are no active problems to display for this patient.   Past Medical History  Diagnosis Date  . Diabetes mellitus without complication   . Hypertension     History reviewed. No pertinent past surgical history.  History  Substance Use Topics  . Smoking status: Former Research scientist (life sciences)  . Smokeless tobacco: Not on file  . Alcohol Use: 0.0 oz/week    0 Standard drinks or equivalent per week     Comment: social use    History reviewed. No pertinent family history.  Not on File  Medication list has been reviewed and updated.  No current outpatient prescriptions on file prior to visit.   No current facility-administered medications on file prior to visit.    Review of Systems:  As per HPI, otherwise negative.    Physical Examination: Filed Vitals:   11/02/14 1827  BP: 140/80  Pulse: 70  Temp: 97.8 F (36.6 C)  Resp: 20   Filed Vitals:   11/02/14 1827  Height: 5' 9.75" (1.772 m)  Weight: 233 lb (105.688 kg)   Body mass index is 33.66 kg/(m^2). Ideal Body Weight: Weight in (lb) to have BMI = 25: 172.6   GEN: WDWN, NAD, Non-toxic, Alert & Oriented x 3 HEENT: Atraumatic, Normocephalic.  Ears and Nose: No external deformity. EXTR: No clubbing/cyanosis/edema NEURO: Normal gait.  PSYCH: Normally interactive. Conversant. Not depressed or anxious appearing.  Calm demeanor.   Assessment and Plan: DOT renewal  Signed,  Ellison Carwin, MD

## 2014-11-14 ENCOUNTER — Ambulatory Visit: Payer: BLUE CROSS/BLUE SHIELD | Admitting: *Deleted

## 2014-11-14 ENCOUNTER — Telehealth: Payer: Self-pay | Admitting: *Deleted

## 2014-11-14 VITALS — BP 160/90

## 2014-11-14 DIAGNOSIS — I10 Essential (primary) hypertension: Secondary | ICD-10-CM

## 2014-11-14 NOTE — Telephone Encounter (Signed)
1) Did he pass DOT physical?  2) I want him scheduled for sleep study and renal artery [place] to rule out renal artery stenosis.  Both can cause uncontrolled BP.    3) Is he still on clonidine and bystolic.

## 2014-11-14 NOTE — Telephone Encounter (Signed)
Pt came in today for BP check states it was running high and wanted it checked stated he checked it 52mns ago 3pm and it was 190/97, I checked it 160/90. I informed the pt that I will route the readings to provider and if needs to adjust will give him a call.

## 2014-11-15 NOTE — Telephone Encounter (Signed)
BP is elevated. I would like him to go back on the clonidine until we have arranged a sleep study and the renal artery ultrasound.

## 2014-11-15 NOTE — Telephone Encounter (Signed)
Contacted pt and he did PASS his DOT CPE   Pt stated Dr. Dennard Schaumann told him to stay off the clonidine, but is still taking the bystolic  Pt is agrees with the sleep study and Renal US.

## 2014-11-17 NOTE — Telephone Encounter (Signed)
Pt aware of message

## 2014-11-21 ENCOUNTER — Telehealth: Payer: Self-pay | Admitting: Family Medicine

## 2014-11-21 NOTE — Telephone Encounter (Signed)
Patient is calling to say that the bystolic that was prescribed is too expensive would like to know if dr pickard could prescribe something different  [phone] [facility]

## 2014-11-22 ENCOUNTER — Other Ambulatory Visit: Payer: Self-pay | Admitting: Family Medicine

## 2014-11-22 DIAGNOSIS — G4700 Insomnia, unspecified: Secondary | ICD-10-CM

## 2014-11-22 DIAGNOSIS — I701 Atherosclerosis of renal artery: Secondary | ICD-10-CM

## 2014-11-22 DIAGNOSIS — G4733 Obstructive sleep apnea (adult) (pediatric): Secondary | ICD-10-CM

## 2014-11-22 MED ORDER — ATENOLOL 50 MG PO TABS: 50.0000 mg | ORAL_TABLET | Freq: Every day | ORAL | Status: DC

## 2014-11-22 NOTE — Addendum Note (Signed)
Addended by: Shary Decamp B on: 11/22/2014 10:20 AM   Modules accepted: Orders, Medications

## 2014-11-22 NOTE — Telephone Encounter (Signed)
Switch to atenolol 50 mg poqday

## 2014-11-22 NOTE — Telephone Encounter (Signed)
Med sent to pharm and pt aware

## 2014-11-22 NOTE — Telephone Encounter (Signed)
referrall placed for [place] and sleep study

## 2014-11-25 ENCOUNTER — Other Ambulatory Visit: Payer: Self-pay

## 2014-12-05 ENCOUNTER — Ambulatory Visit
Admission: RE | Admit: 2014-12-05 | Discharge: 2014-12-05 | Disposition: A | Discharge status: Home / Self Care | Payer: BLUE CROSS/BLUE SHIELD | Source: Ambulatory Visit | Attending: Family Medicine | Admitting: Family Medicine

## 2014-12-05 DIAGNOSIS — I701 Atherosclerosis of renal artery: Secondary | ICD-10-CM

## 2014-12-08 ENCOUNTER — Other Ambulatory Visit: Payer: Self-pay | Admitting: Gastroenterology

## 2014-12-09 ENCOUNTER — Telehealth: Payer: Self-pay | Admitting: Gastroenterology

## 2014-12-09 MED ORDER — BALSALAZIDE DISODIUM 750 MG PO CAPS: ORAL_CAPSULE | ORAL | Status: DC

## 2014-12-09 NOTE — Telephone Encounter (Signed)
Informed patient that I will send Colazal to the pharmacy but he needs to keep his follow-up appt scheduled in June. Pt agreed and verbalized understanding.

## 2014-12-15 ENCOUNTER — Other Ambulatory Visit: Payer: Self-pay | Admitting: Family Medicine

## 2014-12-27 ENCOUNTER — Encounter: Payer: Self-pay | Admitting: Gastroenterology

## 2015-01-25 ENCOUNTER — Encounter: Payer: Self-pay | Admitting: Gastroenterology

## 2015-01-25 ENCOUNTER — Ambulatory Visit (INDEPENDENT_AMBULATORY_CARE_PROVIDER_SITE_OTHER): Payer: BLUE CROSS/BLUE SHIELD | Admitting: Gastroenterology

## 2015-01-25 ENCOUNTER — Other Ambulatory Visit (INDEPENDENT_AMBULATORY_CARE_PROVIDER_SITE_OTHER): Payer: BLUE CROSS/BLUE SHIELD

## 2015-01-25 ENCOUNTER — Encounter (INDEPENDENT_AMBULATORY_CARE_PROVIDER_SITE_OTHER): Payer: Self-pay

## 2015-01-25 VITALS — BP 202/100 | HR 76 | Ht 70.0 in | Wt 242.2 lb

## 2015-01-25 DIAGNOSIS — K5190 Ulcerative colitis, unspecified, without complications: Secondary | ICD-10-CM | POA: Diagnosis not present

## 2015-01-25 DIAGNOSIS — Z86010 Personal history of colonic polyps: Secondary | ICD-10-CM

## 2015-01-25 LAB — COMPREHENSIVE METABOLIC PANEL
ALK PHOS: 53 U/L (ref 39–117)
ALT: 52 U/L (ref 0–53)
AST: 39 U/L — AB (ref 0–37)
Albumin: 4.2 g/dL (ref 3.5–5.2)
BUN: 17 mg/dL (ref 6–23)
CALCIUM: 9.6 mg/dL (ref 8.4–10.5)
CHLORIDE: 103 meq/L (ref 96–112)
CO2: 29 mEq/L (ref 19–32)
CREATININE: 1.46 mg/dL (ref 0.40–1.50)
GFR: 52.44 mL/min — ABNORMAL LOW (ref >60.00)
Glucose, Bld: 120 mg/dL — ABNORMAL HIGH (ref 70–99)
POTASSIUM: 3.8 meq/L (ref 3.5–5.1)
Sodium: 137 mEq/L (ref 135–145)
Total Bilirubin: 0.4 mg/dL (ref 0.2–1.2)
Total Protein: 6.9 g/dL (ref 6.0–8.3)

## 2015-01-25 LAB — CBC WITH DIFFERENTIAL/PLATELET
Basophils Absolute: 0 10*3/uL (ref 0.0–0.1)
Basophils Relative: 0.5 % (ref 0.0–3.0)
EOS ABS: 0.2 10*3/uL (ref 0.0–0.7)
Eosinophils Relative: 3.2 % (ref 0.0–5.0)
HCT: 40.8 % (ref 39.0–52.0)
HEMOGLOBIN: 13.8 g/dL (ref 13.0–17.0)
LYMPHS PCT: 37.2 % (ref 12.0–46.0)
Lymphs Abs: 2.3 10*3/uL (ref 0.7–4.0)
MCHC: 33.7 g/dL (ref 30.0–36.0)
MCV: 90 fl (ref 78.0–100.0)
MONO ABS: 0.8 10*3/uL (ref 0.1–1.0)
Monocytes Relative: 13 % — ABNORMAL HIGH (ref 3.0–12.0)
NEUTROS ABS: 2.8 10*3/uL (ref 1.4–7.7)
NEUTROS PCT: 46.1 % (ref 43.0–77.0)
Platelets: 195 10*3/uL (ref 150.0–400.0)
RBC: 4.53 Mil/uL (ref 4.22–5.81)
RDW: 13.3 % (ref 11.5–15.5)
WBC: 6.1 10*3/uL (ref 4.0–10.5)

## 2015-01-25 MED ORDER — BALSALAZIDE DISODIUM 750 MG PO CAPS: ORAL_CAPSULE | ORAL | Status: DC

## 2015-01-25 MED ORDER — NA SULFATE-K SULFATE-MG SULF 17.5-3.13-1.6 GM/177ML PO SOLN: ORAL | Status: DC

## 2015-01-25 NOTE — Progress Notes (Signed)
    History of Present Illness: This is a 59 year old male with a history of ulcerative colitis and adenomatous colon polyps. He deferred on prior plans for a two-year interval surveillance colonoscopy and he will be due for a 3 year interval surveillance colonoscopy this summer. He has no colorectal complaints.  Current Medications, Allergies, Past Medical History, Past Surgical History, Family History and Social History were reviewed in [employer] record.  Physical Exam: General: Well developed , well nourished, no acute distress Head: Normocephalic and atraumatic Eyes:  sclerae anicteric, EOMI Ears: Normal auditory acuity Mouth: No deformity or lesions Lungs: Clear throughout to auscultation Heart: Regular rate and rhythm; no murmurs, rubs or bruits Abdomen: Soft, non tender and non distended. No masses, hepatosplenomegaly or hernias noted. Normal Bowel sounds Musculoskeletal: Symmetrical with no gross deformities  Pulses:  Normal pulses noted Extremities: No clubbing, cyanosis, edema or deformities noted Neurological: Alert oriented x 4, grossly nonfocal Psychological:  Alert and cooperative. Normal mood and affect  Assessment and Recommendations:  1. Ulcerative pancolitis. Continue balsalazide at current dose. Obtain a CBC and CMET. Schedule colonoscopies. The risks (including bleeding, perforation, infection, missed lesions, medication reactions and possible hospitalization or surgery if complications occur), benefits, and alternatives to colonoscopy with possible biopsy and possible polypectomy were discussed with the patient and they consent to proceed.  . 2. Personal history of adenomatous colon polyps. Colonoscopy as above.

## 2015-01-25 NOTE — Patient Instructions (Addendum)
You have been scheduled for a colonoscopy. Please follow written instructions given to you at your visit today.  Please pick up your prep supplies at the pharmacy within the next 1-3 days. If you use inhalers (even only as needed), please bring them with you on the day of your procedure. Your physician has requested that you go to www.startemmi.com and enter the access code given to you at your visit today. This web site gives a general overview about your procedure. However, you should still follow specific instructions given to you by our office regarding your preparation for the procedure.  Your physician has requested that you go to the basement for the following lab work before leaving today: CBC and CMP  We have sent the following medications to your pharmacy for you to pick up at your convenience: Colazal  Today your blood pressure was elevated. Please follow up with your Primary Care Provider for blood pressure management.

## 2015-01-26 ENCOUNTER — Other Ambulatory Visit: Payer: Self-pay | Admitting: Family Medicine

## 2015-01-26 ENCOUNTER — Other Ambulatory Visit: Payer: Self-pay | Admitting: Gastroenterology

## 2015-01-26 NOTE — Telephone Encounter (Signed)
Refill appropriate and filled per protocol. 

## 2015-02-12 ENCOUNTER — Other Ambulatory Visit: Payer: Self-pay | Admitting: Family Medicine

## 2015-02-14 NOTE — Telephone Encounter (Signed)
0.1 mg clonidine denied, pt on 2 mg dose now

## 2015-02-24 ENCOUNTER — Other Ambulatory Visit: Payer: Self-pay | Admitting: Family Medicine

## 2015-03-27 ENCOUNTER — Encounter: Payer: Self-pay | Admitting: Family Medicine

## 2015-03-27 ENCOUNTER — Ambulatory Visit (INDEPENDENT_AMBULATORY_CARE_PROVIDER_SITE_OTHER): Payer: BLUE CROSS/BLUE SHIELD | Admitting: Family Medicine

## 2015-03-27 ENCOUNTER — Encounter: Payer: Self-pay | Admitting: Gastroenterology

## 2015-03-27 ENCOUNTER — Encounter: Payer: BLUE CROSS/BLUE SHIELD | Admitting: Family Medicine

## 2015-03-27 VITALS — BP 152/80 | HR 88 | Temp 98.8°F | Resp 18 | Ht 70.0 in | Wt 236.0 lb

## 2015-03-27 DIAGNOSIS — I10 Essential (primary) hypertension: Secondary | ICD-10-CM | POA: Diagnosis not present

## 2015-03-27 MED ORDER — HYDROCHLOROTHIAZIDE 25 MG PO TABS: 25.0000 mg | ORAL_TABLET | Freq: Every day | ORAL | Status: DC

## 2015-03-28 ENCOUNTER — Encounter: Payer: Self-pay | Admitting: Family Medicine

## 2015-03-28 NOTE — Progress Notes (Signed)
   Subjective:    Patient ID: Matthew Chen, male    DOB: May 22, 1956, 59 y.o.   MRN: [id number]  HPI Please see the other office visit dictated for the date 03/28/2015.   Review of Systems     Objective:   Physical Exam        Assessment & Plan:

## 2015-03-28 NOTE — Progress Notes (Signed)
Subjective:    Patient ID: Matthew Chen, male    DOB: Jan 13, 1956, 59 y.o.   MRN: [id number]  Hypertension  10/31/14 Please see the recent nurse visits. 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 He is extremely anxious and worried about this. I have started the patient on clonidine 0.2 mg by mouth twice a day as well as bystolic 10 mg by mouth daily.  However his blood pressure is not responding at all to these medication. I truly believe he is so anxious and so worried about passing his DOT physical that his blood pressures artificially elevated due to anxiety. The patient agrees, he cannot even sleep at night.  He is waking up several times at night due to fear over losing his job. He is almost having panic attacks. I believe that if we give him further blood pressure medication we may cause dangerous hypotension once his anxiety improves. I really believe his blood pressure now is almost a pseudo-white coat syndrome due to fear of losing his job.  At that time, my plan was: I will give the patient Xanax 1 mg by mouth daily at bedtime as needed for anxiety or insomnia. I believe that if the sleeping better this may actually help lower his blood pressure by calming his anxiety. Once we document that his blood pressure has improved by controlling his anxiety and allowing him to sleep better, I may even discontinue some of his other blood pressure medication. If his blood pressure consistently remains elevated even after managing his anxiety better however, I will proceed with workup for renal artery stenosis as well as obstructive sleep apnea.  03/28/15 Patient is here today for follow-up.  He is currently on benazepril 40 mg by mouth daily, atenolol 50 mg by mouth daily, clonidine 0.1 mg by mouth twice a day and his blood pressure continues to be poorly controlled. Patient states his blood pressure at home ranges between 160 and 170/80-90. He is asymptomatic with this but he is very  frustrated and concerned why his blood pressure does not seem to respond medication. I have been seeing the patient for the last 8 years and his blood pressure has always been controlled until the last 8 months. Since that time we have maximized his ACE inhibitor, added clonidine, added atenolol, and his blood pressure does not seem to respond. Patient has had a renal ultrasound but has not had evaluation for renal artery stenosis. He continues to decline evaluation for sleep apnea as the patient states that he is asymptomatic Past Medical History  Diagnosis Date  . Ulcerative colitis   . Hemorrhoids   . Diabetes mellitus   . Hyperlipidemia   . Hypertension   . Adenomatous colon polyp 03/2008   Past Surgical History  Procedure Laterality Date  . Tonsillectomy    . Skin surgery      head - birthmark removal  . Colonoscopy    . Polypectomy     Current Outpatient Prescriptions on File Prior to Visit  Medication Sig Dispense Refill  . ALPRAZolam (XANAX) 1 MG tablet Take 1 tablet (1 mg total) by mouth at bedtime as needed for anxiety or sleep. 20 tablet 0  . atenolol (TENORMIN) 50 MG tablet TAKE 1 TABLET BY MOUTH EVERY DAY 30 tablet 11  . balsalazide (COLAZAL) 750 MG capsule TAKE 3 CAPSULES BY MOUTH 3 TIMES A DAY 270 capsule 9  . benazepril (LOTENSIN) 40 MG tablet TAKE 1 TABLET BY MOUTH  EVERY DAY 30 tablet 1  . cloNIDine (CATAPRES) 0.1 MG tablet TAKE 1 TABLET (0.1 MG TOTAL) BY MOUTH 3 (THREE) TIMES DAILY. 90 tablet 11  . fenofibrate 160 MG tablet TAKE 1 TABLET BY MOUTH EVERY DAY 30 tablet 5  . glipiZIDE (GLUCOTROL) 10 MG tablet TAKE 1 TABLET BY MOUTH EVERY DAY 30 tablet 5  . metFORMIN (GLUCOPHAGE) 1000 MG tablet TAKE 1 TABLET BY MOUTH TWICE A DAY WITH A MEAL 60 tablet 5  . simvastatin (ZOCOR) 20 MG tablet TAKE 1 TABLET BY MOUTH EVERY EVENING 30 tablet 5   No current facility-administered medications on file prior to visit.   Allergies  Allergen Reactions  . Penicillins     unsure    Social History   Social History  . Marital Status: Married    Spouse Name: N/A  . Number of Children: 2  . Years of Education: N/A   Occupational History  . Truck driver   .     Social History Main Topics  . Smoking status: Former Smoker    Types: Cigarettes    Quit date: 08/12/1996  . Smokeless tobacco: Former Systems developer    Types: Chew    Quit date: 08/12/1996  . Alcohol Use: No     Comment: occasionally  . Drug Use: No  . Sexual Activity: Not on file   Other Topics Concern  . Not on file   Social History Narrative      Review of Systems  All other systems reviewed and are negative.      Objective:   Physical Exam  Cardiovascular: Normal rate, regular rhythm and normal heart sounds.   Pulmonary/Chest: Effort normal and breath sounds normal. No respiratory distress. He has no wheezes. He has no rales. He exhibits no tenderness.  Abdominal: Soft. Bowel sounds are normal.  Vitals reviewed.         Assessment & Plan:  Benign essential HTN - Plan: Ambulatory referral to Cardiology  I believe the patient has essential hypertension. I explained to the patient that some blood pressures are more difficult to control and others and we need to continue to have medication until we get his blood pressure under control. Therefore I will start the patient on hydrochlorothiazide 25 mg by mouth daily. We will need to monitor his renal function closely as he does have mild chronic kidney disease. However I will admit that his blood pressure has become increasingly difficult to control only over the last 8 months. That raises the suspicion of possible secondary hypertension due to renal artery stenosis or obstructive sleep apnea, etc. I would like the patient to see [name] with cardiology to evaluate for possible causes of secondary hypertension such as renal artery stenosis.

## 2015-03-28 NOTE — Progress Notes (Signed)
This encounter was created in error - please disregard.

## 2015-03-30 ENCOUNTER — Ambulatory Visit: Payer: BLUE CROSS/BLUE SHIELD | Admitting: Family Medicine

## 2015-04-03 ENCOUNTER — Encounter: Payer: BLUE CROSS/BLUE SHIELD | Admitting: Gastroenterology

## 2015-04-10 ENCOUNTER — Ambulatory Visit (AMBULATORY_SURGERY_CENTER): Payer: BLUE CROSS/BLUE SHIELD | Admitting: Gastroenterology

## 2015-04-10 ENCOUNTER — Encounter: Payer: Self-pay | Admitting: Gastroenterology

## 2015-04-10 VITALS — BP 124/69 | HR 62 | Temp 96.7°F | Resp 16 | Ht 70.0 in | Wt 242.0 lb

## 2015-04-10 DIAGNOSIS — K51918 Ulcerative colitis, unspecified with other complication: Secondary | ICD-10-CM | POA: Diagnosis present

## 2015-04-10 DIAGNOSIS — D124 Benign neoplasm of descending colon: Secondary | ICD-10-CM

## 2015-04-10 DIAGNOSIS — D123 Benign neoplasm of transverse colon: Secondary | ICD-10-CM

## 2015-04-10 DIAGNOSIS — Z86010 Personal history of colonic polyps: Secondary | ICD-10-CM

## 2015-04-10 MED ORDER — SODIUM CHLORIDE 0.9 % IV SOLN: 500.0000 mL | INTRAVENOUS | Status: DC

## 2015-04-10 NOTE — Progress Notes (Signed)
Called to room to assist during endoscopic procedure.  Patient ID and intended procedure confirmed with present staff. Received instructions for my participation in the procedure from the performing physician.  

## 2015-04-10 NOTE — Progress Notes (Signed)
To recovery, report to Tyrell, RN, VSS

## 2015-04-10 NOTE — Patient Instructions (Signed)
YOU HAD AN ENDOSCOPIC PROCEDURE TODAY AT THE [facility]:   Refer to the procedure report that was given to you for any specific questions about what was found during the examination.  If the procedure report does not answer your questions, please call your gastroenterologist to clarify.  If you requested that your care partner not be given the details of your procedure findings, then the procedure report has been included in a sealed envelope for you to review at your convenience later.  YOU SHOULD EXPECT: Some feelings of bloating in the abdomen. Passage of more gas than usual.  Walking can help get rid of the air that was put into your GI tract during the procedure and reduce the bloating. If you had a lower endoscopy (such as a colonoscopy or flexible sigmoidoscopy) you may notice spotting of blood in your stool or on the toilet paper. If you underwent a bowel prep for your procedure, you may not have a normal bowel movement for a few days.  Please Note:  You might notice some irritation and congestion in your nose or some drainage.  This is from the oxygen used during your procedure.  There is no need for concern and it should clear up in a day or so.  SYMPTOMS TO REPORT IMMEDIATELY:   Following lower endoscopy (colonoscopy or flexible sigmoidoscopy):  Excessive amounts of blood in the stool  Significant tenderness or worsening of abdominal pains  Swelling of the abdomen that is new, acute  Fever of 100F or higher   For urgent or emergent issues, a gastroenterologist can be reached at any hour by calling [phone].   DIET: Your first meal following the procedure should be a small meal and then it is ok to progress to your normal diet. Heavy or fried foods are harder to digest and may make you feel nauseous or bloated.  Likewise, meals heavy in dairy and vegetables can increase bloating.  Drink plenty of fluids but you should avoid alcoholic beverages for 24  hours.  ACTIVITY:  You should plan to take it easy for the rest of today and you should NOT DRIVE or use heavy machinery until tomorrow (because of the sedation medicines used during the test).    FOLLOW UP: Our staff will call the number listed on your records the next business day following your procedure to check on you and address any questions or concerns that you may have regarding the information given to you following your procedure. If we do not reach you, we will leave a message.  However, if you are feeling well and you are not experiencing any problems, there is no need to return our call.  We will assume that you have returned to your regular daily activities without incident.  If any biopsies were taken you will be contacted by phone or by letter within the next 1-3 weeks.  Please call us at [phone] if you have not heard about the biopsies in 3 weeks.    SIGNATURES/CONFIDENTIALITY: You and/or your care partner have signed paperwork which will be entered into your electronic medical record.  These signatures attest to the fact that that the information above on your After Visit Summary has been reviewed and is understood.  Full responsibility of the confidentiality of this discharge information lies with you and/or your care-partner. 

## 2015-04-10 NOTE — Progress Notes (Signed)
[facility] aware of patient's blood pressure. Patient is seeing a specialist for it at the end of September.  He is aware of stroke symptoms and is aware to call his pcp or go to ER if experiencing them. Patient is asymptomatic at this time.

## 2015-04-10 NOTE — Op Note (Signed)
[place]  [address] [place], [id number]   COLONOSCOPY PROCEDURE REPORT  PATIENT: Matthew Chen  MR#: [id number] BIRTHDATE: Jan 03, 1956 , 59  yrs. old GENDER: male ENDOSCOPIST: [name], MD, [facility] PROCEDURE DATE:  04/10/2015 PROCEDURE:   Colonoscopy, surveillance , Colonoscopy with biopsy, and Colonoscopy with snare polypectomy First Screening Colonoscopy - Avg.  risk and is 50 yrs.  old or older - No.  Prior Negative Screening - Now for repeat screening. N/A  History of Adenoma - Now for follow-up colonoscopy & has been > or = to 3 yrs.  Yes hx of adenoma.  Has been 3 or more years since last colonoscopy.  Polyps removed today? Yes ASA CLASS:   Class II INDICATIONS:Inflammatory bowel disease of the intestine if more precise diagnosis or determination of the extent / severity of activity of disease will influence immediate / future management, Surveillance due to prior colonic neoplasia, PH Colon Adenoma, and Inflammatory Bowel Disease (at least 8 years pancolitis or 15 years left sided colitis). MEDICATIONS: Monitored anesthesia care and Propofol 200 mg IV DESCRIPTION OF PROCEDURE:   After the risks benefits and alternatives of the procedure were thoroughly explained, informed consent was obtained.  The digital rectal exam revealed no abnormalities of the rectum.   The LB [id number]-H190 [id number]  endoscope was introduced through the anus and advanced to the cecum, which was identified by both the appendix and ileocecal valve. No adverse events experienced.   The quality of the prep was excellent. (Suprep was used)  The instrument was then slowly withdrawn as the colon was fully examined. Estimated blood loss is zero unless otherwise noted in this procedure report.    COLON FINDINGS: Four sessile polyps measuring 4-6 mm in size were found in the descending colon (1) and transverse colon (3). Polypectomies were performed with a cold snare (6 mm trans  colon) and with cold forceps.  The resection was complete, the polyp tissue was completely retrieved and sent to histology. Pseudopolyps were found in the descending colon.   Abnormal mucosa was found in the rectum, sigmoid colon, and descending colon.  The mucosa was erythematous and had loss of vascularity.  This was likely consistent with ulcerative colitis disease.  Multiple random biopsies were performed using cold forceps.   The colonic mucosa appeared normal at the hepatic flexure, in the transverse colon, ascending colon, at the cecum, and ileocecal valve.  Multiple random biopsies were performed using cold forceps.   The examination was otherwise normal.  Retroflexed views revealed no abnormalities. The time to cecum = 1.5 Withdrawal time = 12.7   The scope was withdrawn and the procedure completed. COMPLICATIONS: There were no immediate complications.  ENDOSCOPIC IMPRESSION: 1.   Four sessile polyps in the descending colon and transverse colon; polypectomies performed with cold snare and with cold forceps 2.   Pseudopolyps in the descending colon 3.   Abnormal mucosa in the left colon; multiple random biopsies performed using cold forceps 4.   The colonic mucosa otherwise appeared normal; multiple random biopsies performed using cold forceps 5.   The examination was otherwise normal  RECOMMENDATIONS: 1.  Await pathology results 2.  Repeat colonoscopy in 1-3 years pending pathology review  eSigned:  [name], MD, [facility] 04/10/2015 9:47 AM     PATIENT NAME:  Matthew Chen, Matthew Chen MR#: [id number]

## 2015-04-11 ENCOUNTER — Telehealth: Payer: Self-pay | Admitting: *Deleted

## 2015-04-11 NOTE — Telephone Encounter (Signed)
  Follow up Call-  Call back number 04/10/2015  Post procedure Call Back phone  # [phone]  Permission to leave phone message Yes     Patient questions:  Busy signal.

## 2015-04-13 ENCOUNTER — Encounter: Payer: Self-pay | Admitting: Gastroenterology

## 2015-04-21 ENCOUNTER — Other Ambulatory Visit: Payer: Self-pay | Admitting: Gastroenterology

## 2015-05-05 ENCOUNTER — Encounter: Payer: Self-pay | Admitting: Cardiovascular Disease

## 2015-05-05 ENCOUNTER — Ambulatory Visit (INDEPENDENT_AMBULATORY_CARE_PROVIDER_SITE_OTHER): Payer: BLUE CROSS/BLUE SHIELD | Admitting: Cardiovascular Disease

## 2015-05-05 VITALS — BP 142/94 | HR 64 | Ht 70.0 in | Wt 230.8 lb

## 2015-05-05 DIAGNOSIS — R011 Cardiac murmur, unspecified: Secondary | ICD-10-CM | POA: Diagnosis not present

## 2015-05-05 DIAGNOSIS — I739 Peripheral vascular disease, unspecified: Secondary | ICD-10-CM

## 2015-05-05 DIAGNOSIS — I10 Essential (primary) hypertension: Secondary | ICD-10-CM | POA: Diagnosis not present

## 2015-05-05 LAB — TSH: TSH: 1.131 u[IU]/mL (ref 0.350–4.500)

## 2015-05-05 MED ORDER — CARVEDILOL 25 MG PO TABS: 25.0000 mg | ORAL_TABLET | Freq: Two times a day (BID) | ORAL | Status: DC

## 2015-05-05 NOTE — Patient Instructions (Signed)
Your physician has requested that you have an echocardiogram. Echocardiography is a painless test that uses sound waves to create images of your heart. It provides your doctor with information about the size and shape of your heart and how well your heart's chambers and valves are working. This procedure takes approximately one hour. There are no restrictions for this procedure.  Your physician has requested that you have an ankle brachial index (ABI). During this test an ultrasound and blood pressure cuff are used to evaluate the arteries that supply the arms and legs with blood. Allow thirty minutes for this exam. There are no restrictions or special instructions.  Your physician has requested that you have a renal artery duplex. During this test, an ultrasound is used to evaluate blood flow to the kidneys. Allow one hour for this exam. Do not eat after midnight the day before and avoid carbonated beverages. Take your medications as you usually do.  LABS- TSH. STOP TAKING ATENOLOL  START CARVEDILOL 25 MG ONE TABLET TWICE A DAY.  Your physician recommends that you schedule a follow-up appointment in [place] physician recommends that you schedule a follow-up appointment in [place].

## 2015-05-05 NOTE — Progress Notes (Signed)
Cardiology Office Note   Date:  05/05/2015   ID:  Matthew Chen, DOB 23-Sep-1955, MRN [id number]  PCP:  Odette Fraction, MD  Cardiologist:   Sharol Harness, MD   Chief Complaint  Patient presents with  . New Evaluation    referred by PCP//pt c/o SOB on exertion, claudication, and HTN even though on BP meds       History of Present Illness: Matthew Chen is a 59 y.o. male with diabetes mellitus, hypertension, hyperlipidemia and anxiety who presents for an evaluation of hypertension. Matthew Chen reports in over 25 year history of hypertension. Previously it was relatively easy to manage. However in the last year is has become increasingly difficult. 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  He has been evaluated by his PCP, [name] and started on clonidine 0.82m bid and nebivolol 179mdaily.  His office blood pressures remained elevated on this regimen.  Dr. PiDennard Schaumannas concerned that his BP was elevated due to the anxiety of passing this physical, so he was started on Xanax at night.  His regimen was switched to benazepril 4061maily, atenolol 14m60mily and clonididne 0.1mg 79mbid and his home BP ranged from 160-170/80-90.  Hydrochlorothiazide was added to his regimen and he was referred to cardiology for evaluation of secondary causes of hypertension.  He has been taking all medications as prescribed.  Matthew Chen any chest pain. He does endorse leg cramping when walking for the last year. He gets better with rest and then recurs as he starts walking again. 4-5 years ago he was able to walk over an hour to time. Now he can't walk more than 10 minutes. He also endorses some mild shortness of breath with walking. He denies any lower extremity edema, orthopnea, or PND. He does snore some, but does not think that he awakens throughout the night due to lack of oxygen. His wife is with him and concurs.  Matthew Chen not  smoke. He quit 17 years ago. He occasionally drinks alcohol and does not use any illicit drugs. He does not frequently use NSAIDs or over-the-counter decongestants.    Past Medical History  Diagnosis Date  . Ulcerative colitis   . Hemorrhoids   . Diabetes mellitus   . Hyperlipidemia   . Hypertension   . Adenomatous colon polyp 03/2008    Past Surgical History  Procedure Laterality Date  . Tonsillectomy    . Skin surgery      head - birthmark removal  . Colonoscopy    . Polypectomy       Current Outpatient Prescriptions  Medication Sig Dispense Refill  . ALPRAZolam (XANAX) 1 MG tablet Take 1 tablet (1 mg total) by mouth at bedtime as needed for anxiety or sleep. 20 tablet 0  . balsalazide (COLAZAL) 750 MG capsule TAKE 3 CAPSULES BY MOUTH 3 TIMES A DAY 270 capsule 9  . benazepril (LOTENSIN) 40 MG tablet TAKE 1 TABLET BY MOUTH EVERY DAY 30 tablet 1  . cloNIDine (CATAPRES) 0.1 MG tablet TAKE 1 TABLET (0.1 MG TOTAL) BY MOUTH 3 (THREE) TIMES DAILY. 90 tablet 11  . fenofibrate 160 MG tablet TAKE 1 TABLET BY MOUTH EVERY DAY 30 tablet 5  . glipiZIDE (GLUCOTROL) 10 MG tablet TAKE 1 TABLET BY MOUTH EVERY DAY 30 tablet 5  . hydrochlorothiazide (HYDRODIURIL) 25 MG tablet Take 1 tablet (25 mg total) by mouth  daily. 90 tablet 3  . metFORMIN (GLUCOPHAGE) 1000 MG tablet TAKE 1 TABLET BY MOUTH TWICE A DAY WITH A MEAL 60 tablet 5  . simvastatin (ZOCOR) 20 MG tablet TAKE 1 TABLET BY MOUTH EVERY EVENING 30 tablet 5  . carvedilol (COREG) 25 MG tablet Take 1 tablet (25 mg total) by mouth 2 (two) times daily. 60 tablet 11   No current facility-administered medications for this visit.    Allergies:   Penicillins    Social History:  The patient  reports that he quit smoking about 18 years ago. His smoking use included Cigarettes. He quit smokeless tobacco use about 18 years ago. His smokeless tobacco use included Chew. He reports that he does not drink alcohol or use illicit drugs.   Family  History:  The patient's family history includes Colon cancer in his maternal grandmother; Colon polyps in his mother; Diabetes in his maternal aunt and mother; Heart disease in his father. There is no history of Esophageal cancer, Stomach cancer, or Rectal cancer.    ROS:  Please see the history of present illness.   Otherwise, review of systems are positive for none.   All other systems are reviewed and negative.    PHYSICAL EXAM: VS:  BP 142/94 mmHg  Ht 5' 10"  (1.778 m)  Wt 104.69 kg (230 lb 12.8 oz)  BMI 33.12 kg/m2 , BMI Body mass index is 33.12 kg/(m^2). GENERAL:  Well appearing HEENT:  Pupils equal round and reactive, fundi not visualized, oral mucosa unremarkable NECK:  No jugular venous distention, waveform within normal limits, carotid upstroke brisk and symmetric, no bruits, no thyromegaly LYMPHATICS:  No cervical adenopathy LUNGS:  Clear to auscultation bilaterally HEART:  RRR.  PMI not displaced or sustained,S1 and S2 within normal limits, no S3, no S4, no clicks, no rubs, II/VI early-peaking, crescendo-decrescendo murmur at RUSB ABD:  Flat, positive bowel sounds normal in frequency in pitch, no bruits, no rebound, no guarding, no midline pulsatile mass, no hepatomegaly, no splenomegaly EXT:  2 plus radial pulses bilaterally.  1+ popliteal, DP/PT bilaterally, no edema, no cyanosis no clubbing SKIN:  No rashes no nodules NEURO:  Cranial nerves II through XII grossly intact, motor grossly intact throughout PSYCH:  Cognitively intact, oriented to person place and time    EKG:  EKG is ordered today. The ekg ordered today demonstrates sinus rhythm rate 64 bpm.     Recent Labs: 01/25/2015: ALT 52; BUN 17; Creatinine, Ser 1.46; Hemoglobin 13.8; Platelets 195.0; Potassium 3.8; Sodium 137    Lipid Panel    Component Value Date/Time   CHOL 133 07/25/2014 0925   TRIG 204* 07/25/2014 0925   HDL 35* 07/25/2014 0925   CHOLHDL 3.8 07/25/2014 0925   VLDL 41* 07/25/2014 0925    LDLCALC 57 07/25/2014 0925      Wt Readings from Last 3 Encounters:  05/05/15 104.69 kg (230 lb 12.8 oz)  04/10/15 109.77 kg (242 lb)  03/27/15 107.049 kg (236 lb)      Other studies Reviewed: Additional studies/ records that were reviewed today include:   Review of the above records demonstrates:  Please see elsewhere in the note.     ASSESSMENT AND PLAN:  # Hypertension:  Mr. Andonian's hypertension has become increasingly difficult to control. He is currently on 4 medications and still not controlled. At this point he warrants a workup for secondary causes of hypertension. He had a renal ultrasound but it did not include renal arterial dopplers. We will obtain this  study. He denies symptoms such as flushing, diaphoresis, and episodic hypertension that would make [place] suspicious for pheochromocytoma. Given that he is currently on an ACE inhibitor, testing for hyperaldosteronism would not be helpful. There is some concern that he could have obstructive sleep apnea that is not treated and could be making his blood pressure more difficult to control. We will refer him for sleep study. His pulses and blood pressures are symmetric on each side, making coarctation unlikely. He does not report intake of NSAIDs, amphetamines, or over-the-counter blood pressure medications. He has not have a cushingoid appearance on exam and is not taking any exogenous steroids. We will check his thyroid function.  For now we will switch his atenolol to carvedilol 25 mg twice a day. He will follow-up with our pharmacist in 3 weeks and with me in November. 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 He thinks that he may only be allowed to be on 2 blood pressure medications, but he is going to check on this. We looked into consolidating to combination pills, though this would not lower his pill burden at this time. If his blood pressure remains poorly  controlled, would consider adding either amlodipine or doxazosin. I would like to get him off clonidine if possible.  # Murmur: Mr. Cortright has a mild systolic murmur consistent with mild aortic stenosis. He says that his father had a valve replaced. We will obtain a echo for a baseline assessment. He has no physical exam findings consistent with heart failure, but does report exertional dyspnea..  # Claudication: Mr. Heslin reports symptoms classic for claudication. He also has diminished peripheral pulses on each side. We will refer him for ABI testing.  Current medicines are reviewed at length with the patient today.  The patient does not have concerns regarding medicines.  The following changes have been made:  Switch atenolol to carvedilol  Labs/ tests ordered today include:  Orders Placed This Encounter  Procedures  . TSH  . EKG 12-Lead  . ECHOCARDIOGRAM COMPLETE  ABI Sleep study Renal artery duplex ultrasound   Disposition:   FU with Tiffany C. Oval Linsey, MD in November, and with Tommy Medal in 3 weeks.   Signed, Sharol Harness, MD  05/05/2015 12:37 PM    [facility]

## 2015-05-08 ENCOUNTER — Telehealth: Payer: Self-pay | Admitting: *Deleted

## 2015-05-08 NOTE — Telephone Encounter (Signed)
Spoke to patient. Result given . Verbalized understanding  

## 2015-05-08 NOTE — Telephone Encounter (Signed)
-----   Message from Skeet Latch, MD sent at 05/08/2015  3:38 PM EDT ----- Normal thyroid function

## 2015-05-15 ENCOUNTER — Other Ambulatory Visit: Payer: Self-pay

## 2015-05-15 ENCOUNTER — Ambulatory Visit (HOSPITAL_COMMUNITY): Payer: BLUE CROSS/BLUE SHIELD | Attending: Cardiology

## 2015-05-15 DIAGNOSIS — I739 Peripheral vascular disease, unspecified: Secondary | ICD-10-CM

## 2015-05-15 DIAGNOSIS — E119 Type 2 diabetes mellitus without complications: Secondary | ICD-10-CM | POA: Insufficient documentation

## 2015-05-15 DIAGNOSIS — I10 Essential (primary) hypertension: Secondary | ICD-10-CM | POA: Diagnosis not present

## 2015-05-15 DIAGNOSIS — I77810 Thoracic aortic ectasia: Secondary | ICD-10-CM | POA: Insufficient documentation

## 2015-05-15 DIAGNOSIS — I352 Nonrheumatic aortic (valve) stenosis with insufficiency: Secondary | ICD-10-CM | POA: Insufficient documentation

## 2015-05-15 DIAGNOSIS — Q231 Congenital insufficiency of aortic valve: Secondary | ICD-10-CM | POA: Diagnosis not present

## 2015-05-15 DIAGNOSIS — I517 Cardiomegaly: Secondary | ICD-10-CM | POA: Diagnosis not present

## 2015-05-15 DIAGNOSIS — E785 Hyperlipidemia, unspecified: Secondary | ICD-10-CM | POA: Insufficient documentation

## 2015-05-15 DIAGNOSIS — I340 Nonrheumatic mitral (valve) insufficiency: Secondary | ICD-10-CM | POA: Diagnosis not present

## 2015-05-15 DIAGNOSIS — I5189 Other ill-defined heart diseases: Secondary | ICD-10-CM | POA: Insufficient documentation

## 2015-05-15 DIAGNOSIS — R011 Cardiac murmur, unspecified: Secondary | ICD-10-CM

## 2015-05-18 ENCOUNTER — Telehealth: Payer: Self-pay | Admitting: Cardiovascular Disease

## 2015-05-18 NOTE — Telephone Encounter (Signed)
Results given to patient. Explained valve abnormality. Pt voiced understanding. If further concerns or questions, advised to call.

## 2015-05-18 NOTE — Telephone Encounter (Signed)
Pt calling back,does not know who called him today.

## 2015-05-29 ENCOUNTER — Ambulatory Visit (HOSPITAL_BASED_OUTPATIENT_CLINIC_OR_DEPARTMENT_OTHER)
Admission: RE | Admit: 2015-05-29 | Discharge: 2015-05-29 | Disposition: A | Discharge status: Home / Self Care | Payer: BLUE CROSS/BLUE SHIELD | Source: Ambulatory Visit | Attending: Cardiology | Admitting: Cardiology

## 2015-05-29 ENCOUNTER — Ambulatory Visit (INDEPENDENT_AMBULATORY_CARE_PROVIDER_SITE_OTHER): Payer: BLUE CROSS/BLUE SHIELD | Admitting: Pharmacist Clinician (PhC)/ Clinical Pharmacy Specialist

## 2015-05-29 ENCOUNTER — Ambulatory Visit (HOSPITAL_COMMUNITY)
Admission: RE | Admit: 2015-05-29 | Discharge: 2015-05-29 | Disposition: A | Discharge status: Home / Self Care | Payer: BLUE CROSS/BLUE SHIELD | Source: Ambulatory Visit | Attending: Cardiology | Admitting: Cardiology

## 2015-05-29 ENCOUNTER — Other Ambulatory Visit: Payer: Self-pay | Admitting: Cardiovascular Disease

## 2015-05-29 VITALS — BP 138/84 | HR 56 | Wt 228.8 lb

## 2015-05-29 DIAGNOSIS — I739 Peripheral vascular disease, unspecified: Secondary | ICD-10-CM | POA: Diagnosis not present

## 2015-05-29 DIAGNOSIS — Z87891 Personal history of nicotine dependence: Secondary | ICD-10-CM | POA: Diagnosis not present

## 2015-05-29 DIAGNOSIS — I10 Essential (primary) hypertension: Secondary | ICD-10-CM

## 2015-05-29 DIAGNOSIS — E119 Type 2 diabetes mellitus without complications: Secondary | ICD-10-CM | POA: Insufficient documentation

## 2015-05-29 DIAGNOSIS — R011 Cardiac murmur, unspecified: Secondary | ICD-10-CM | POA: Diagnosis not present

## 2015-05-29 DIAGNOSIS — I70203 Unspecified atherosclerosis of native arteries of extremities, bilateral legs: Secondary | ICD-10-CM | POA: Insufficient documentation

## 2015-05-29 DIAGNOSIS — E785 Hyperlipidemia, unspecified: Secondary | ICD-10-CM | POA: Diagnosis not present

## 2015-05-29 MED ORDER — AMLODIPINE BESYLATE 5 MG PO TABS: 5.0000 mg | ORAL_TABLET | Freq: Every day | ORAL | Status: DC

## 2015-05-29 NOTE — Patient Instructions (Signed)
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  Take your BP meds as follows: stop clonidine 0.1 mg bid, start amlodipine 5 mg each evening.  All other medications unchanged  Bring all of your meds, your BP cuff and your record of home blood pressures to your next appointment.  Exercise as you're able, try to walk approximately 30 minutes per day.  Keep salt intake to a minimum, especially watch canned and prepared boxed foods.  Eat more fresh fruits and vegetables and fewer canned items.  Avoid eating in fast food restaurants.    HOW TO TAKE YOUR BLOOD PRESSURE: . Rest 5 minutes before taking your blood pressure. .  Don't smoke or drink caffeinated beverages for at least 30 minutes before. . Take your blood pressure before (not after) you eat. . Sit comfortably with your back supported and both feet on the floor (don't cross your legs). . Elevate your arm to heart level on a table or a desk. . Use the proper sized cuff. It should fit smoothly and snugly around your bare upper arm. There should be enough room to slip a fingertip under the cuff. The bottom edge of the cuff should be 1 inch above the crease of the elbow. . Ideally, take 3 measurements at one sitting and record the average.

## 2015-05-30 ENCOUNTER — Encounter: Payer: Self-pay | Admitting: Pharmacist Clinician (PhC)/ Clinical Pharmacy Specialist

## 2015-05-30 NOTE — Progress Notes (Signed)
     05/30/2015 Matthew Chen [birth year] [id number]   HPI:  Matthew Chen is a 59 y.o. male patient of Dr Oval Linsey, with a [place] below who presents today for hypertension clinic evaluation.  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  When he came to Dr. Oval Linsey he was on benazepril 40 mg, clonidine 0.1 mg bid and atenolol 50 mg qd.  Dr. Oval Linsey switched the atenolol to carvedilol 25 mg bid and added HCTZ 25 mg.  He is in today for follow up.  He does report dry mouth and some fatigue today.     Cardiac Hx: hypertension, hyperlipidemia  Family Hx: mother and brother, both living, with HTN, father had valve replacement in his 103s, died from non-cardiac issues  Social Hx: quit smoking 18 years ago, drinks only the occasional beer; 3-4 cups of coffee per day, otherwise only drinks water  Diet: does not add salt to meals, however eats out several nights per week, including both fast food and sit down restaurants  Exercise: none  Home BP readings: has home cuff, has not used recently.   Did check at [facility] once 146/74 late last week  Current antihypertensive medications: benazepril 40 mg qam, hctz 25 mg qam, carvedilol 25 mg bid, clonidine 0.1 mg bid   Current Outpatient Prescriptions  Medication Sig Dispense Refill  . ALPRAZolam (XANAX) 1 MG tablet Take 1 tablet (1 mg total) by mouth at bedtime as needed for anxiety or sleep. 20 tablet 0  . amLODipine (NORVASC) 5 MG tablet Take 1 tablet (5 mg total) by mouth daily. 30 tablet 2  . balsalazide (COLAZAL) 750 MG capsule TAKE 3 CAPSULES BY MOUTH 3 TIMES A DAY 270 capsule 9  . benazepril (LOTENSIN) 40 MG tablet TAKE 1 TABLET BY MOUTH EVERY DAY 30 tablet 1  . carvedilol (COREG) 25 MG tablet Take 1 tablet (25 mg total) by mouth 2 (two) times daily. 60 tablet 11  . fenofibrate 160 MG tablet TAKE 1 TABLET BY MOUTH EVERY DAY 30 tablet 5  . glipiZIDE (GLUCOTROL) 10 MG  tablet TAKE 1 TABLET BY MOUTH EVERY DAY 30 tablet 5  . hydrochlorothiazide (HYDRODIURIL) 25 MG tablet Take 1 tablet (25 mg total) by mouth daily. 90 tablet 3  . metFORMIN (GLUCOPHAGE) 1000 MG tablet TAKE 1 TABLET BY MOUTH TWICE A DAY WITH A MEAL 60 tablet 5  . simvastatin (ZOCOR) 20 MG tablet TAKE 1 TABLET BY MOUTH EVERY EVENING 30 tablet 5   No current facility-administered medications for this visit.    Allergies  Allergen Reactions  . Penicillins     unsure    Past Medical History  Diagnosis Date  . Ulcerative colitis   . Hemorrhoids   . Diabetes mellitus   . Hyperlipidemia   . Hypertension   . Adenomatous colon polyp 03/2008    Blood pressure 138/84, pulse 56, weight 228 lb 12.8 oz (103.783 kg).    Tommy Medal PharmD CPP [facility]

## 2015-05-30 NOTE — Assessment & Plan Note (Addendum)
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  No indication in chart that patient has tried amlodipine.  Will discontinue clonidine and start with amlodipine 5 mg each evening.  Asked patient to check home BP 3-4 times per week until return visit.  Will see him again in 1 month, about 2 weeks prior to DOT appointment.   Explained that the fatigue could be coming from the clonidine or the carvedilol, and will hopefully decrease as we now have discontinued clonidine.  If persists will consider decreasing dose of carvedilol in the future.

## 2015-05-31 ENCOUNTER — Telehealth: Payer: Self-pay | Admitting: *Deleted

## 2015-05-31 NOTE — Telephone Encounter (Signed)
-----   Message from Skeet Latch, MD sent at 05/31/2015  2:07 PM EDT ----- No renal artery stenosis.  This does not explain his high blood pressure.

## 2015-05-31 NOTE — Telephone Encounter (Signed)
-----   Message from Skeet Latch, MD sent at 05/31/2015  2:10 PM EDT ----- Abnormal blood flow in both legs could explain his sytmpoms.  Please refer to [name].

## 2015-05-31 NOTE — Telephone Encounter (Signed)
Spoke to patient. Result given . Verbalized understanding Patient aware needs appointment to see Dr Gwenlyn Found- f/u results of [facility] DOPPLERS

## 2015-06-01 ENCOUNTER — Telehealth: Payer: Self-pay | Admitting: *Deleted

## 2015-06-01 DIAGNOSIS — I739 Peripheral vascular disease, unspecified: Secondary | ICD-10-CM

## 2015-06-01 DIAGNOSIS — Z79899 Other long term (current) drug therapy: Secondary | ICD-10-CM

## 2015-06-01 DIAGNOSIS — E785 Hyperlipidemia, unspecified: Secondary | ICD-10-CM

## 2015-06-01 MED ORDER — ASPIRIN EC 81 MG PO TBEC: 81.0000 mg | DELAYED_RELEASE_TABLET | Freq: Every day | ORAL | Status: DC

## 2015-06-01 MED ORDER — ATORVASTATIN CALCIUM 40 MG PO TABS: 40.0000 mg | ORAL_TABLET | Freq: Every day | ORAL | Status: DC

## 2015-06-01 NOTE — Telephone Encounter (Signed)
-----   Message from Skeet Latch, MD sent at 05/31/2015 10:16 PM EDT ----- Based on the ABIs he should start aspirin 81 mg daily.  Also, switch simvastatin to atorvastatin 40 mg and we will repeat his lipids at his follow up appointment.

## 2015-06-01 NOTE — Telephone Encounter (Signed)
Spoke to patient. Result given . Verbalized understanding E sent to pharmacy Changed medication list Labs due in dec 2016, next appointment schedule for 08/2015

## 2015-06-12 ENCOUNTER — Encounter: Payer: Self-pay | Admitting: Family Medicine

## 2015-06-12 ENCOUNTER — Ambulatory Visit (INDEPENDENT_AMBULATORY_CARE_PROVIDER_SITE_OTHER): Payer: BLUE CROSS/BLUE SHIELD | Admitting: Family Medicine

## 2015-06-12 VITALS — BP 126/84 | HR 58 | Temp 98.1°F | Resp 16 | Ht 70.0 in | Wt 230.0 lb

## 2015-06-12 DIAGNOSIS — Z23 Encounter for immunization: Secondary | ICD-10-CM | POA: Diagnosis not present

## 2015-06-12 DIAGNOSIS — E785 Hyperlipidemia, unspecified: Secondary | ICD-10-CM | POA: Diagnosis not present

## 2015-06-12 DIAGNOSIS — E1151 Type 2 diabetes mellitus with diabetic peripheral angiopathy without gangrene: Secondary | ICD-10-CM | POA: Diagnosis not present

## 2015-06-12 DIAGNOSIS — I10 Essential (primary) hypertension: Secondary | ICD-10-CM

## 2015-06-12 LAB — CBC WITH DIFFERENTIAL/PLATELET
BASOS PCT: 1 % (ref 0–1)
Basophils Absolute: 0 10*3/uL (ref 0.0–0.1)
EOS PCT: 3 % (ref 0–5)
Eosinophils Absolute: 0.1 10*3/uL (ref 0.0–0.7)
HCT: 40 % (ref 39.0–52.0)
Hemoglobin: 13.2 g/dL (ref 13.0–17.0)
Lymphocytes Relative: 30 % (ref 12–46)
Lymphs Abs: 1.4 10*3/uL (ref 0.7–4.0)
MCH: 29.9 pg (ref 26.0–34.0)
MCHC: 33 g/dL (ref 30.0–36.0)
MCV: 90.7 fL (ref 78.0–100.0)
MONO ABS: 0.7 10*3/uL (ref 0.1–1.0)
MPV: 11 fL (ref 8.6–12.4)
Monocytes Relative: 14 % — ABNORMAL HIGH (ref 3–12)
Neutro Abs: 2.4 10*3/uL (ref 1.7–7.7)
Neutrophils Relative %: 52 % (ref 43–77)
Platelets: 217 10*3/uL (ref 150–400)
RBC: 4.41 MIL/uL (ref 4.22–5.81)
RDW: 13.7 % (ref 11.5–15.5)
WBC: 4.7 10*3/uL (ref 4.0–10.5)

## 2015-06-12 LAB — HEMOGLOBIN A1C
Hgb A1c MFr Bld: 7.1 % — ABNORMAL HIGH (ref <5.7)
Mean Plasma Glucose: 157 mg/dL — ABNORMAL HIGH (ref <117)

## 2015-06-12 LAB — COMPLETE METABOLIC PANEL WITH GFR
ALT: 39 U/L (ref 9–46)
AST: 32 U/L (ref 10–35)
Albumin: 4.1 g/dL (ref 3.6–5.1)
Alkaline Phosphatase: 57 U/L (ref 40–115)
BUN: 22 mg/dL (ref 7–25)
CALCIUM: 9.1 mg/dL (ref 8.6–10.3)
CHLORIDE: 105 mmol/L (ref 98–110)
CO2: 25 mmol/L (ref 20–31)
Creat: 1.5 mg/dL — ABNORMAL HIGH (ref 0.70–1.33)
GFR, Est African American: 58 mL/min — ABNORMAL LOW (ref >=60)
GFR, Est Non African American: 50 mL/min — ABNORMAL LOW (ref >=60)
GLUCOSE: 148 mg/dL — AB (ref 70–99)
POTASSIUM: 4.4 mmol/L (ref 3.5–5.3)
SODIUM: 140 mmol/L (ref 135–146)
Total Bilirubin: 0.5 mg/dL (ref 0.2–1.2)
Total Protein: 6.7 g/dL (ref 6.1–8.1)

## 2015-06-12 LAB — LIPID PANEL
CHOL/HDL RATIO: 4.3 ratio (ref <=5.0)
CHOLESTEROL: 107 mg/dL — AB (ref 125–200)
HDL: 25 mg/dL — AB (ref >=40)
LDL Cholesterol: 37 mg/dL (ref <130)
Triglycerides: 227 mg/dL — ABNORMAL HIGH (ref <150)
VLDL: 45 mg/dL — AB (ref <30)

## 2015-06-12 MED ORDER — CANAGLIFLOZIN 300 MG PO TABS: 300.0000 mg | ORAL_TABLET | Freq: Every day | ORAL | Status: DC

## 2015-06-12 NOTE — Addendum Note (Signed)
Addended by: Shary Decamp B on: 06/12/2015 10:05 AM   Modules accepted: Orders

## 2015-06-12 NOTE — Progress Notes (Signed)
Subjective:    Patient ID: Matthew Chen, male    DOB: October 04, 1955, 59 y.o.   MRN: [id number]  HPI 10/31/14 Please see the recent nurse visits. 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 He is extremely anxious and worried about this. I have started the patient on clonidine 0.2 mg by mouth twice a day as well as bystolic 10 mg by mouth daily.  However his blood pressure is not responding at all to these medication. I truly believe he is so anxious and so worried about passing his DOT physical that his blood pressures artificially elevated due to anxiety. The patient agrees, he cannot even sleep at night.  He is waking up several times at night due to fear over losing his job. He is almost having panic attacks. I believe that if we give him further blood pressure medication we may cause dangerous hypotension once his anxiety improves. I really believe his blood pressure now is almost a pseudo-white coat syndrome due to fear of losing his job.  At that time, my plan was: I will give the patient Xanax 1 mg by mouth daily at bedtime as needed for anxiety or insomnia. I believe that if the sleeping better this may actually help lower his blood pressure by calming his anxiety. Once we document that his blood pressure has improved by controlling his anxiety and allowing him to sleep better, I may even discontinue some of his other blood pressure medication. If his blood pressure consistently remains elevated even after managing his anxiety better however, I will proceed with workup for renal artery stenosis as well as obstructive sleep apnea.  03/28/15 Patient is here today for follow-up.  He is currently on benazepril 40 mg by mouth daily, atenolol 50 mg by mouth daily, clonidine 0.1 mg by mouth twice a day and his blood pressure continues to be poorly controlled. Patient states his blood pressure at home ranges between 160 and 170/80-90. He is asymptomatic with this but he is very frustrated  and concerned why his blood pressure does not seem to respond medication. I have been seeing the patient for the last 8 years and his blood pressure has always been controlled until the last 8 months. Since that time we have maximized his ACE inhibitor, added clonidine, added atenolol, and his blood pressure does not seem to respond. Patient has had a renal ultrasound but has not had evaluation for renal artery stenosis. He continues to decline evaluation for sleep apnea as the patient states that he is asymptomatic.  AT that time, my plan was: I believe the patient has essential hypertension. I explained to the patient that some blood pressures are more difficult to control and others and we need to continue to have medication until we get his blood pressure under control. Therefore I will start the patient on hydrochlorothiazide 25 mg by mouth daily. We will need to monitor his renal function closely as he does have mild chronic kidney disease. However I will admit that his blood pressure has become increasingly difficult to control only over the last 8 months. That raises the suspicion of possible secondary hypertension due to renal artery stenosis or obstructive sleep apnea, etc. I would like the patient to see [name] with cardiology to evaluate for possible causes of secondary hypertension such as renal artery stenosis.  06/12/15 Renal artery Dopplers revealed no evidence of renal artery stenosis. He did have lower extremity arterial Dopplers which suggested possible peripheral  vascular disease. He is here today for follow-up.  Cardiology changed his atenolol to carvedilol. They discontinue clonidine and started him on amlodipine. He was continued on benazepril along with hydrochlorothiazide. His blood pressure recently at the cardiology office are well controlled.  Blood pressure here today is well controlled. He denies any fatigue. He denies any swelling in his legs on amlodipine. He is due for a  fasting lipid panel to monitor his cholesterol as well as a hemoglobin A1c. He is also due for a flu shot. He would also like some assistance with weight loss. He is not exercising but he is monitoring his diet Past Medical History  Diagnosis Date  . Ulcerative colitis   . Hemorrhoids   . Diabetes mellitus   . Hyperlipidemia   . Hypertension   . Adenomatous colon polyp 03/2008   Past Surgical History  Procedure Laterality Date  . Tonsillectomy    . Skin surgery      head - birthmark removal  . Colonoscopy    . Polypectomy     Current Outpatient Prescriptions on File Prior to Visit  Medication Sig Dispense Refill  . ALPRAZolam (XANAX) 1 MG tablet Take 1 tablet (1 mg total) by mouth at bedtime as needed for anxiety or sleep. 20 tablet 0  . amLODipine (NORVASC) 5 MG tablet Take 1 tablet (5 mg total) by mouth daily. 30 tablet 2  . aspirin EC 81 MG tablet Take 1 tablet (81 mg total) by mouth daily. 90 tablet 3  . atorvastatin (LIPITOR) 40 MG tablet Take 1 tablet (40 mg total) by mouth daily. 30 tablet 11  . balsalazide (COLAZAL) 750 MG capsule TAKE 3 CAPSULES BY MOUTH 3 TIMES A DAY 270 capsule 9  . benazepril (LOTENSIN) 40 MG tablet TAKE 1 TABLET BY MOUTH EVERY DAY 30 tablet 1  . carvedilol (COREG) 25 MG tablet Take 1 tablet (25 mg total) by mouth 2 (two) times daily. 60 tablet 11  . fenofibrate 160 MG tablet TAKE 1 TABLET BY MOUTH EVERY DAY 30 tablet 5  . glipiZIDE (GLUCOTROL) 10 MG tablet TAKE 1 TABLET BY MOUTH EVERY DAY 30 tablet 5  . hydrochlorothiazide (HYDRODIURIL) 25 MG tablet Take 1 tablet (25 mg total) by mouth daily. 90 tablet 3  . metFORMIN (GLUCOPHAGE) 1000 MG tablet TAKE 1 TABLET BY MOUTH TWICE A DAY WITH A MEAL 60 tablet 5   No current facility-administered medications on file prior to visit.   Allergies  Allergen Reactions  . Penicillins     unsure   Social History   Social History  . Marital Status: Married    Spouse Name: N/A  . Number of Children: 2  . Years  of Education: N/A   Occupational History  . Truck driver   .     Social History Main Topics  . Smoking status: Former Smoker -- 1.50 packs/day for 25 years    Types: Cigarettes    Quit date: 08/12/1996  . Smokeless tobacco: Former Systems developer    Types: Chew    Quit date: 08/12/1996  . Alcohol Use: No     Comment: occasionally  . Drug Use: No  . Sexual Activity: Not on file   Other Topics Concern  . Not on file   Social History Narrative      Review of Systems  All other systems reviewed and are negative.      Objective:   Physical Exam  Cardiovascular: Normal rate, regular rhythm and normal heart sounds.  Pulmonary/Chest: Effort normal and breath sounds normal. No respiratory distress. He has no wheezes. He has no rales. He exhibits no tenderness.  Abdominal: Soft. Bowel sounds are normal.  Vitals reviewed.         Assessment & Plan:  DM (diabetes mellitus) type II controlled peripheral vascular disorder ([place]) - Plan: canagliflozin (INVOKANA) 300 MG TABS tablet, CBC with Differential/Platelet, COMPLETE METABOLIC PANEL WITH GFR, Lipid panel, Hemoglobin A1c, Microalbumin, urine  Benign essential HTN  HLD (hyperlipidemia)  Blood pressure is well controlled. I will make no changes in his medication at this time. To help him lose weight I will discontinue glipizide and replace it with invokana 300 mg by mouth daily. I will also check a hemoglobin A1c as well as a urine microalbumin. I will also check a fasting lipid panel. Goal LDL cholesterol is now less than 70 given his peripheral vascular disease. Patient received his flu shot today in clinic

## 2015-06-13 ENCOUNTER — Encounter: Payer: Self-pay | Admitting: Family Medicine

## 2015-06-13 LAB — MICROALBUMIN, URINE: Microalb, Ur: 0.6 mg/dL

## 2015-06-16 ENCOUNTER — Telehealth: Payer: Self-pay | Admitting: Family Medicine

## 2015-06-16 NOTE — Telephone Encounter (Signed)
PA for Invokana submitted thru CMM case [id number].

## 2015-06-21 ENCOUNTER — Other Ambulatory Visit: Payer: Self-pay | Admitting: Family Medicine

## 2015-06-21 NOTE — Telephone Encounter (Signed)
Refill appropriate and filled per protocol. 

## 2015-06-22 MED ORDER — DAPAGLIFLOZIN PROPANEDIOL 10 MG PO TABS: 10.0000 mg | ORAL_TABLET | Freq: Every day | ORAL | Status: DC

## 2015-06-22 NOTE — Telephone Encounter (Signed)
farxiga 10 poqday

## 2015-06-22 NOTE — Telephone Encounter (Signed)
Pt's plan does not cover Invokana.  Will cover [place] or Jardiance.  Please advise?

## 2015-06-22 NOTE — Telephone Encounter (Signed)
Pt made aware of new RX.  Told to come by and pick up coupon for free [place].

## 2015-06-26 ENCOUNTER — Ambulatory Visit: Payer: BLUE CROSS/BLUE SHIELD | Admitting: Pharmacist Clinician (PhC)/ Clinical Pharmacy Specialist

## 2015-06-28 ENCOUNTER — Ambulatory Visit: Payer: BLUE CROSS/BLUE SHIELD | Admitting: Pharmacist Clinician (PhC)/ Clinical Pharmacy Specialist

## 2015-06-28 ENCOUNTER — Ambulatory Visit (INDEPENDENT_AMBULATORY_CARE_PROVIDER_SITE_OTHER): Payer: BLUE CROSS/BLUE SHIELD | Admitting: Cardiovascular Disease

## 2015-06-28 ENCOUNTER — Other Ambulatory Visit: Payer: Self-pay

## 2015-06-28 ENCOUNTER — Ambulatory Visit
Admission: RE | Admit: 2015-06-28 | Discharge: 2015-06-28 | Disposition: A | Discharge status: Home / Self Care | Payer: BLUE CROSS/BLUE SHIELD | Source: Ambulatory Visit | Attending: Cardiovascular Disease | Admitting: Cardiovascular Disease

## 2015-06-28 ENCOUNTER — Encounter: Payer: Self-pay | Admitting: Cardiovascular Disease

## 2015-06-28 VITALS — BP 150/80 | HR 55 | Ht 70.0 in | Wt 226.0 lb

## 2015-06-28 DIAGNOSIS — I739 Peripheral vascular disease, unspecified: Secondary | ICD-10-CM

## 2015-06-28 LAB — CBC WITH DIFFERENTIAL/PLATELET
BASOS PCT: 1 % (ref 0–1)
Basophils Absolute: 0.1 10*3/uL (ref 0.0–0.1)
EOS ABS: 0.1 10*3/uL (ref 0.0–0.7)
EOS PCT: 2 % (ref 0–5)
HCT: 41 % (ref 39.0–52.0)
Hemoglobin: 14.2 g/dL (ref 13.0–17.0)
Lymphocytes Relative: 36 % (ref 12–46)
Lymphs Abs: 2.2 10*3/uL (ref 0.7–4.0)
MCH: 31.3 pg (ref 26.0–34.0)
MCHC: 34.6 g/dL (ref 30.0–36.0)
MCV: 90.3 fL (ref 78.0–100.0)
MONO ABS: 0.7 10*3/uL (ref 0.1–1.0)
MPV: 11.6 fL (ref 8.6–12.4)
Monocytes Relative: 11 % (ref 3–12)
NEUTROS ABS: 3.1 10*3/uL (ref 1.7–7.7)
Neutrophils Relative %: 50 % (ref 43–77)
PLATELETS: 256 10*3/uL (ref 150–400)
RBC: 4.54 MIL/uL (ref 4.22–5.81)
RDW: 13.8 % (ref 11.5–15.5)
WBC: 6.2 10*3/uL (ref 4.0–10.5)

## 2015-06-28 LAB — BASIC METABOLIC PANEL
BUN: 21 mg/dL (ref 7–25)
CHLORIDE: 100 mmol/L (ref 98–110)
CO2: 27 mmol/L (ref 20–31)
Calcium: 10 mg/dL (ref 8.6–10.3)
Creat: 1.51 mg/dL — ABNORMAL HIGH (ref 0.70–1.33)
GLUCOSE: 177 mg/dL — AB (ref 65–99)
POTASSIUM: 4.5 mmol/L (ref 3.5–5.3)
SODIUM: 140 mmol/L (ref 135–146)

## 2015-06-28 MED ORDER — AMLODIPINE BESYLATE 10 MG PO TABS: 10.0000 mg | ORAL_TABLET | Freq: Every day | ORAL | Status: DC

## 2015-06-28 NOTE — Progress Notes (Signed)
06/28/2015 [name]   1955/08/21  [id number]  Primary Physician Odette Fraction, MD Primary Cardiologist: Lorretta Harp MD Renae Gloss   HPI:  [name] is a delightful 59 year old mild to moderately overweight married Caucasian male father of 4 children, grandfather of a grandchildren accompanied by his wife [name] today. He was referred by Dr. Skeet Latch for evaluation and treatment of claudication. He has a history of treated hypertension, diabetes and hyperlipidemia. 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 He's never had a heart attack or stroke and denies chest pain or shortness of breath. He does complain of lifestyle limiting claudication and had arterial Doppler studies performed in our office 05/29/15 revealing high-grade right popliteal and distal left SFA disease. Based on this, we decided to proceed with percutaneous revascularization.   Current Outpatient Prescriptions  Medication Sig Dispense Refill  . ALPRAZolam (XANAX) 1 MG tablet Take 1 tablet (1 mg total) by mouth at bedtime as needed for anxiety or sleep. 20 tablet 0  . aspirin EC 81 MG tablet Take 1 tablet (81 mg total) by mouth daily. 90 tablet 3  . atorvastatin (LIPITOR) 40 MG tablet Take 1 tablet (40 mg total) by mouth daily. 30 tablet 11  . balsalazide (COLAZAL) 750 MG capsule TAKE 3 CAPSULES BY MOUTH 3 TIMES A DAY 270 capsule 9  . benazepril (LOTENSIN) 40 MG tablet TAKE 1 TABLET BY MOUTH EVERY DAY 30 tablet 5  . carvedilol (COREG) 25 MG tablet Take 1 tablet (25 mg total) by mouth 2 (two) times daily. 60 tablet 11  . dapagliflozin propanediol (FARXIGA) 10 MG TABS tablet Take 10 mg by mouth daily. 30 tablet 11  . fenofibrate 160 MG tablet TAKE 1 TABLET BY MOUTH EVERY DAY 30 tablet 5  . glipiZIDE (GLUCOTROL) 10 MG tablet TAKE 1 TABLET BY MOUTH EVERY DAY 30 tablet 5  . hydrochlorothiazide (HYDRODIURIL) 25 MG tablet Take 1 tablet (25 mg total) by mouth  daily. 90 tablet 3  . metFORMIN (GLUCOPHAGE) 1000 MG tablet TAKE 1 TABLET BY MOUTH TWICE A DAY WITH A MEAL 60 tablet 5  . simvastatin (ZOCOR) 20 MG tablet TAKE 1 TABLET BY MOUTH EVERY EVENING 30 tablet 5   No current facility-administered medications for this visit.    Allergies  Allergen Reactions  . Penicillins     unsure    Social History   Social History  . Marital Status: Married    Spouse Name: N/A  . Number of Children: 2  . Years of Education: N/A   Occupational History  . Truck driver   .     Social History Main Topics  . Smoking status: Former Smoker -- 1.50 packs/day for 25 years    Types: Cigarettes    Quit date: 08/12/1996  . Smokeless tobacco: Former Systems developer    Types: Chew    Quit date: 08/12/1996  . Alcohol Use: No     Comment: occasionally  . Drug Use: No  . Sexual Activity: Not on file   Other Topics Concern  . Not on file   Social History Narrative     Review of Systems: General: negative for chills, fever, night sweats or weight changes.  Cardiovascular: negative for chest pain, dyspnea on exertion, edema, orthopnea, palpitations, paroxysmal nocturnal dyspnea or shortness of breath Dermatological: negative for rash Respiratory: negative for cough or wheezing Urologic: negative for hematuria Abdominal: negative for nausea, vomiting, diarrhea, bright  red blood per rectum, melena, or hematemesis Neurologic: negative for visual changes, syncope, or dizziness All other systems reviewed and are otherwise negative except as noted above.    Blood pressure 150/80, pulse 55, height 5' 10"  (1.778 m), weight 226 lb (102.513 kg).  General appearance: alert and no distress Neck: no adenopathy, no carotid bruit, no JVD, supple, symmetrical, trachea midline and thyroid not enlarged, symmetric, no tenderness/mass/nodules Lungs: clear to auscultation bilaterally Heart: regular rate and rhythm, S1, S2 normal, no murmur, click, rub or gallop Extremities:  extremities normal, atraumatic, no cyanosis or edema and 2+ pedal pulses bilaterally  EKG not performed today  ASSESSMENT AND PLAN:   Peripheral arterial disease [facility]) Mr. Tootle was referred by Dr. Oval Linsey for peripheral arterial evaluation. He has a history of claudication the last several months with Doppler suggesting ABIs in the 0.8 range with high-grade distal SFA or popliteal disease. He does have hypertension, hyperlipidemia and diabetes as well as remote tobacco abuse. We discussed percutaneous investigation which she wishes to pursue. I will schedule this for tomorrow      Lorretta Harp MD [facility], [facility] 06/28/2015 12:11 PM

## 2015-06-28 NOTE — Patient Instructions (Addendum)
Medication Instructions:  Your physician has recommended you make the following change in your medication:  1) INCREASE Norvasc to 10 mg tablet by mouth ONCE daily   Labwork: Your physician recommends that you return for lab work in: TODAY  The lab can be found on the FIRST FLOOR of out building in [address]   Testing/Procedures: [name] has ordered a peripheral angiogram to be done at [facility].  This procedure is going to look at the bloodflow in your lower extremities.  If [name] is able to open up the arteries, you will have to spend one night in the hospital.  If he is not able to open the arteries, you will be able to go home that same day.  Schedule 11/14  After the procedure, you will not be allowed to drive for 3 days or push, pull, or lift anything greater than 10 lbs for one week.    You will be required to have the following tests prior to the procedure:  1. Blood work-the blood work can be done no more than 7 days prior to the procedure.  It can be done at any [facility] lab.  There is one downstairs on the first floor of this building and one in the [facility] building [phone] [address])  2. Chest Xray-the chest xray order has already been placed at the [facility].     *REPS: Scott  Puncture site Right Groin     Any Other Special Instructions Will Be Listed Below (If Applicable).     If you need a refill on your cardiac medications before your next appointment, please call your pharmacy.

## 2015-06-28 NOTE — Assessment & Plan Note (Signed)
Matthew Chen was referred by Dr. Oval Linsey for peripheral arterial evaluation. He has a history of claudication the last several months with Doppler suggesting ABIs in the 0.8 range with high-grade distal SFA or popliteal disease. He does have hypertension, hyperlipidemia and diabetes as well as remote tobacco abuse. We discussed percutaneous investigation which she wishes to pursue. I will schedule this for tomorrow

## 2015-06-29 ENCOUNTER — Encounter (HOSPITAL_COMMUNITY)
Admission: RE | Disposition: A | Discharge status: Home / Self Care | Payer: Self-pay | Source: Ambulatory Visit | Attending: Cardiovascular Disease

## 2015-06-29 ENCOUNTER — Encounter (HOSPITAL_COMMUNITY): Payer: Self-pay | Admitting: Cardiovascular Disease

## 2015-06-29 ENCOUNTER — Ambulatory Visit (HOSPITAL_COMMUNITY)
Admission: RE | Admit: 2015-06-29 | Discharge: 2015-06-30 | Disposition: A | Discharge status: Home / Self Care | Payer: BLUE CROSS/BLUE SHIELD | Source: Ambulatory Visit | Attending: Cardiovascular Disease | Admitting: Cardiovascular Disease

## 2015-06-29 DIAGNOSIS — Z6832 Body mass index (BMI) 32.0-32.9, adult: Secondary | ICD-10-CM | POA: Diagnosis not present

## 2015-06-29 DIAGNOSIS — I350 Nonrheumatic aortic (valve) stenosis: Secondary | ICD-10-CM | POA: Insufficient documentation

## 2015-06-29 DIAGNOSIS — R944 Abnormal results of kidney function studies: Secondary | ICD-10-CM | POA: Insufficient documentation

## 2015-06-29 DIAGNOSIS — Z7984 Long term (current) use of oral hypoglycemic drugs: Secondary | ICD-10-CM | POA: Diagnosis not present

## 2015-06-29 DIAGNOSIS — I739 Peripheral vascular disease, unspecified: Secondary | ICD-10-CM | POA: Diagnosis present

## 2015-06-29 DIAGNOSIS — E119 Type 2 diabetes mellitus without complications: Secondary | ICD-10-CM | POA: Diagnosis not present

## 2015-06-29 DIAGNOSIS — I10 Essential (primary) hypertension: Secondary | ICD-10-CM | POA: Diagnosis not present

## 2015-06-29 DIAGNOSIS — E663 Overweight: Secondary | ICD-10-CM | POA: Insufficient documentation

## 2015-06-29 DIAGNOSIS — Z87891 Personal history of nicotine dependence: Secondary | ICD-10-CM | POA: Diagnosis not present

## 2015-06-29 DIAGNOSIS — I70212 Atherosclerosis of native arteries of extremities with intermittent claudication, left leg: Secondary | ICD-10-CM

## 2015-06-29 DIAGNOSIS — Z79899 Other long term (current) drug therapy: Secondary | ICD-10-CM | POA: Diagnosis not present

## 2015-06-29 DIAGNOSIS — E785 Hyperlipidemia, unspecified: Secondary | ICD-10-CM | POA: Diagnosis not present

## 2015-06-29 DIAGNOSIS — Z7982 Long term (current) use of aspirin: Secondary | ICD-10-CM | POA: Diagnosis not present

## 2015-06-29 HISTORY — DX: Unspecified osteoarthritis, unspecified site: M19.90

## 2015-06-29 HISTORY — DX: Type 2 diabetes mellitus without complications: E11.9

## 2015-06-29 HISTORY — PX: PERIPHERAL VASCULAR CATHETERIZATION: SHX172C

## 2015-06-29 LAB — GLUCOSE, CAPILLARY
GLUCOSE-CAPILLARY: 172 mg/dL — AB (ref 65–99)
GLUCOSE-CAPILLARY: 191 mg/dL — AB (ref 65–99)
GLUCOSE-CAPILLARY: 118 mg/dL — AB (ref 65–99)
Glucose-Capillary: 139 mg/dL — ABNORMAL HIGH (ref 65–99)

## 2015-06-29 LAB — POCT ACTIVATED CLOTTING TIME
ACTIVATED CLOTTING TIME: 220 s
ACTIVATED CLOTTING TIME: 183 s
Activated Clotting Time: 190 seconds
Activated Clotting Time: 227 seconds
Activated Clotting Time: 239 seconds
Activated Clotting Time: 147 seconds

## 2015-06-29 LAB — PROTIME-INR
INR: 1 (ref <1.50)
PROTHROMBIN TIME: 13.3 s (ref 11.6–15.2)

## 2015-06-29 LAB — APTT: aPTT: 31 seconds (ref 24–37)

## 2015-06-29 SURGERY — LOWER EXTREMITY ANGIOGRAPHY

## 2015-06-29 MED ORDER — AMLODIPINE BESYLATE 10 MG PO TABS
10.0000 mg | ORAL_TABLET | Freq: Every day | ORAL | Status: DC
  Administered 2015-06-29 – 2015-06-30 (×2): 10 mg via ORAL
  Filled 2015-06-29 (×2): qty 1

## 2015-06-29 MED ORDER — MIDAZOLAM HCL 2 MG/2ML IJ SOLN
INTRAMUSCULAR | Status: AC
  Filled 2015-06-29: qty 2

## 2015-06-29 MED ORDER — FENOFIBRATE 160 MG PO TABS
160.0000 mg | ORAL_TABLET | Freq: Every day | ORAL | Status: DC
  Administered 2015-06-30: 160 mg via ORAL
  Filled 2015-06-29 (×2): qty 1

## 2015-06-29 MED ORDER — BENAZEPRIL HCL 20 MG PO TABS
40.0000 mg | ORAL_TABLET | Freq: Every day | ORAL | Status: DC
  Administered 2015-06-30: 10:00:00 40 mg via ORAL
  Filled 2015-06-29: qty 2

## 2015-06-29 MED ORDER — SODIUM CHLORIDE 0.9 % WEIGHT BASED INFUSION: 1.0000 mL/kg/h | INTRAVENOUS | Status: DC

## 2015-06-29 MED ORDER — HYDROCHLOROTHIAZIDE 25 MG PO TABS
25.0000 mg | ORAL_TABLET | Freq: Every day | ORAL | Status: DC
  Administered 2015-06-29 – 2015-06-30 (×2): 25 mg via ORAL
  Filled 2015-06-29 (×2): qty 1

## 2015-06-29 MED ORDER — NITROGLYCERIN 1 MG/10 ML FOR IR/CATH LAB
INTRA_ARTERIAL | Status: DC | PRN
  Administered 2015-06-29 (×3): 200 ug via INTRA_ARTERIAL

## 2015-06-29 MED ORDER — MORPHINE SULFATE (PF) 2 MG/ML IV SOLN
2.0000 mg | INTRAVENOUS | Status: DC | PRN
Start: 2015-06-29 — End: 2015-06-30

## 2015-06-29 MED ORDER — ASPIRIN 81 MG PO CHEW
CHEWABLE_TABLET | ORAL | Status: AC
  Administered 2015-06-29: 81 mg via ORAL
  Filled 2015-06-29: qty 1

## 2015-06-29 MED ORDER — HEPARIN SODIUM (PORCINE) 1000 UNIT/ML IJ SOLN
INTRAMUSCULAR | Status: DC | PRN
  Administered 2015-06-29: 3000 [IU] via INTRAVENOUS
  Administered 2015-06-29: 6000 [IU] via INTRAVENOUS
  Administered 2015-06-29: 4000 [IU] via INTRAVENOUS

## 2015-06-29 MED ORDER — GLIPIZIDE 10 MG PO TABS
10.0000 mg | ORAL_TABLET | Freq: Every day | ORAL | Status: DC
  Filled 2015-06-29 (×2): qty 1

## 2015-06-29 MED ORDER — VIPERSLIDE LUBRICANT OPTIME
TOPICAL | Status: DC | PRN
  Administered 2015-06-29: 12:00:00 via SURGICAL_CAVITY

## 2015-06-29 MED ORDER — FENTANYL CITRATE (PF) 100 MCG/2ML IJ SOLN
INTRAMUSCULAR | Status: DC | PRN
  Administered 2015-06-29 (×2): 25 ug via INTRAVENOUS

## 2015-06-29 MED ORDER — ASPIRIN EC 81 MG PO TBEC: 81.0000 mg | DELAYED_RELEASE_TABLET | Freq: Every day | ORAL | Status: DC

## 2015-06-29 MED ORDER — HEPARIN (PORCINE) IN NACL 2-0.9 UNIT/ML-% IJ SOLN
INTRAMUSCULAR | Status: DC | PRN
  Administered 2015-06-29: 3000 mL

## 2015-06-29 MED ORDER — CANAGLIFLOZIN 100 MG PO TABS
100.0000 mg | ORAL_TABLET | Freq: Every day | ORAL | Status: DC
  Administered 2015-06-30: 100 mg via ORAL
  Filled 2015-06-29: qty 1

## 2015-06-29 MED ORDER — HEPARIN SODIUM (PORCINE) 1000 UNIT/ML IJ SOLN
INTRAMUSCULAR | Status: AC
  Filled 2015-06-29: qty 1

## 2015-06-29 MED ORDER — SODIUM CHLORIDE 0.9 % WEIGHT BASED INFUSION
3.0000 mL/kg/h | INTRAVENOUS | Status: DC
  Administered 2015-06-29: 3 mL/kg/h via INTRAVENOUS

## 2015-06-29 MED ORDER — SODIUM CHLORIDE 0.9 % IV SOLN
INTRAVENOUS | Status: AC
  Administered 2015-06-29: 14:00:00 via INTRAVENOUS

## 2015-06-29 MED ORDER — CARVEDILOL 12.5 MG PO TABS
25.0000 mg | ORAL_TABLET | Freq: Two times a day (BID) | ORAL | Status: DC
  Administered 2015-06-29 – 2015-06-30 (×2): 25 mg via ORAL
  Filled 2015-06-29 (×3): qty 2

## 2015-06-29 MED ORDER — ONDANSETRON HCL 4 MG/2ML IJ SOLN
4.0000 mg | Freq: Four times a day (QID) | INTRAMUSCULAR | Status: DC | PRN
Start: 2015-06-29 — End: 2015-06-30

## 2015-06-29 MED ORDER — HYDRALAZINE HCL 20 MG/ML IJ SOLN: 10.0000 mg | INTRAMUSCULAR | Status: DC | PRN

## 2015-06-29 MED ORDER — IODIXANOL 320 MG/ML IV SOLN
INTRAVENOUS | Status: DC | PRN
  Administered 2015-06-29: 240 mL via INTRA_ARTERIAL

## 2015-06-29 MED ORDER — ASPIRIN EC 325 MG PO TBEC
325.0000 mg | DELAYED_RELEASE_TABLET | Freq: Every day | ORAL | Status: DC
  Administered 2015-06-30: 10:00:00 325 mg via ORAL
  Filled 2015-06-29: qty 1

## 2015-06-29 MED ORDER — ALPRAZOLAM 0.5 MG PO TABS: 1.0000 mg | ORAL_TABLET | Freq: Every evening | ORAL | Status: DC | PRN

## 2015-06-29 MED ORDER — ACETAMINOPHEN 325 MG PO TABS: 650.0000 mg | ORAL_TABLET | ORAL | Status: DC | PRN

## 2015-06-29 MED ORDER — NITROGLYCERIN IN D5W 200-5 MCG/ML-% IV SOLN
INTRAVENOUS | Status: AC
  Filled 2015-06-29: qty 250

## 2015-06-29 MED ORDER — CLOPIDOGREL BISULFATE 75 MG PO TABS
75.0000 mg | ORAL_TABLET | Freq: Every day | ORAL | Status: DC
  Administered 2015-06-30: 08:00:00 75 mg via ORAL
  Filled 2015-06-29: qty 1

## 2015-06-29 MED ORDER — ASPIRIN 81 MG PO CHEW
81.0000 mg | CHEWABLE_TABLET | ORAL | Status: AC
  Administered 2015-06-29: 81 mg via ORAL

## 2015-06-29 MED ORDER — CLOPIDOGREL BISULFATE 300 MG PO TABS
ORAL_TABLET | ORAL | Status: DC | PRN
  Administered 2015-06-29: 300 mg via ORAL

## 2015-06-29 MED ORDER — INSULIN ASPART 100 UNIT/ML ~~LOC~~ SOLN
0.0000 [IU] | Freq: Three times a day (TID) | SUBCUTANEOUS | Status: DC
  Administered 2015-06-29: 3 [IU] via SUBCUTANEOUS

## 2015-06-29 MED ORDER — VERAPAMIL HCL 2.5 MG/ML IV SOLN
INTRAVENOUS | Status: AC
  Filled 2015-06-29: qty 2

## 2015-06-29 MED ORDER — NITROGLYCERIN 1 MG/10 ML FOR IR/CATH LAB
INTRA_ARTERIAL | Status: DC | PRN
  Administered 2015-06-29: 12:00:00

## 2015-06-29 MED ORDER — MIDAZOLAM HCL 2 MG/2ML IJ SOLN
INTRAMUSCULAR | Status: DC | PRN
  Administered 2015-06-29 (×2): 1 mg via INTRAVENOUS

## 2015-06-29 MED ORDER — LIDOCAINE HCL (PF) 1 % IJ SOLN
INTRAMUSCULAR | Status: AC
  Filled 2015-06-29: qty 30

## 2015-06-29 MED ORDER — INSULIN ASPART 100 UNIT/ML ~~LOC~~ SOLN: 0.0000 [IU] | Freq: Every day | SUBCUTANEOUS | Status: DC

## 2015-06-29 MED ORDER — FENTANYL CITRATE (PF) 100 MCG/2ML IJ SOLN
INTRAMUSCULAR | Status: AC
  Filled 2015-06-29: qty 2

## 2015-06-29 MED ORDER — ATORVASTATIN CALCIUM 40 MG PO TABS
40.0000 mg | ORAL_TABLET | Freq: Every day | ORAL | Status: DC
  Administered 2015-06-30: 40 mg via ORAL
  Filled 2015-06-29: qty 1

## 2015-06-29 MED ORDER — SODIUM CHLORIDE 0.9 % IJ SOLN: 3.0000 mL | INTRAMUSCULAR | Status: DC | PRN

## 2015-06-29 MED ORDER — HEPARIN (PORCINE) IN NACL 2-0.9 UNIT/ML-% IJ SOLN
INTRAMUSCULAR | Status: AC
  Filled 2015-06-29: qty 1000

## 2015-06-29 MED ORDER — SODIUM CHLORIDE 0.9 % IV SOLN: INTRAVENOUS | Status: DC | PRN

## 2015-06-29 MED ORDER — CLOPIDOGREL BISULFATE 300 MG PO TABS
ORAL_TABLET | ORAL | Status: AC
  Filled 2015-06-29: qty 1

## 2015-06-29 SURGICAL SUPPLY — 24 items
BALLOON POWERFLX PRO 7X40X135 (BALLOONS) ×2 IMPLANT
CATH ANGIO 5F PIGTAIL 65CM (CATHETERS) ×2 IMPLANT
CATH CROSS OVER TEMPO 5F (CATHETERS) ×2 IMPLANT
CATH QUICKCROSS .018X135CM (MICROCATHETER) ×2 IMPLANT
DEVICE EMBOSHIELD NAV6 4.0-7.0 (WIRE) ×2 IMPLANT
DIAMONDBACK SOLID OAS 2.0MM (CATHETERS) ×2
GUIDEWIRE ANGLED .035X150CM (WIRE) ×2 IMPLANT
GUIDEWIRE REGALIA .014X300CM (WIRE) ×2 IMPLANT
KIT ENCORE 26 ADVANTAGE (KITS) ×2 IMPLANT
KIT PV (KITS) ×2 IMPLANT
LUBRICANT VIPERSLIDE CORONARY (MISCELLANEOUS) ×2 IMPLANT
SHEATH PINNACLE 5F 10CM (SHEATH) ×2 IMPLANT
SHEATH PINNACLE 7F 10CM (SHEATH) ×2 IMPLANT
SHEATH PINNACLE MP 7F 45CM (SHEATH) ×2 IMPLANT
STOPCOCK MORSE 400PSI 3WAY (MISCELLANEOUS) ×2 IMPLANT
SYRINGE MEDRAD AVANTA MACH 7 (SYRINGE) ×2 IMPLANT
SYSTEM DIMNDBCK SLD OAS 2.0MM (CATHETERS) ×1 IMPLANT
TAPE RADIOPAQUE TURBO (MISCELLANEOUS) ×2 IMPLANT
TRANSDUCER W/STOPCOCK (MISCELLANEOUS) ×2 IMPLANT
TRAY PV CATH (CUSTOM PROCEDURE TRAY) ×2 IMPLANT
TUBING CIL FLEX 10 FLL-RA (TUBING) ×2 IMPLANT
WIRE HITORQ VERSACORE ST 145CM (WIRE) ×2 IMPLANT
WIRE ROSEN-J .035X180CM (WIRE) ×2 IMPLANT
WIRE VIPER ADVANCE .017X335CM (WIRE) ×2 IMPLANT

## 2015-06-29 NOTE — H&P (View-Only) (Signed)
06/28/2015 Matthew Chen   [birth year]  [id number]  Primary Physician Matthew Fraction, MD Primary Cardiologist: Matthew Harp MD Matthew Chen   HPI:  Matthew Chen is a delightful 59 year old mild to moderately overweight married Caucasian male father of 4 children, grandfather of a grandchildren accompanied by his wife Matthew Chen today. He was referred by Matthew Chen for evaluation and treatment of claudication. He has a history of treated hypertension, diabetes and hyperlipidemia. 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 He's never had a heart attack or stroke and denies chest pain or shortness of breath. He does complain of lifestyle limiting claudication and had arterial Doppler studies performed in our office 05/29/15 revealing high-grade right popliteal and distal left SFA disease. Based on this, we decided to proceed with percutaneous revascularization.   Current Outpatient Prescriptions  Medication Sig Dispense Refill  . ALPRAZolam (XANAX) 1 MG tablet Take 1 tablet (1 mg total) by mouth at bedtime as needed for anxiety or sleep. 20 tablet 0  . aspirin EC 81 MG tablet Take 1 tablet (81 mg total) by mouth daily. 90 tablet 3  . atorvastatin (LIPITOR) 40 MG tablet Take 1 tablet (40 mg total) by mouth daily. 30 tablet 11  . balsalazide (COLAZAL) 750 MG capsule TAKE 3 CAPSULES BY MOUTH 3 TIMES A DAY 270 capsule 9  . benazepril (LOTENSIN) 40 MG tablet TAKE 1 TABLET BY MOUTH EVERY DAY 30 tablet 5  . carvedilol (COREG) 25 MG tablet Take 1 tablet (25 mg total) by mouth 2 (two) times daily. 60 tablet 11  . dapagliflozin propanediol (FARXIGA) 10 MG TABS tablet Take 10 mg by mouth daily. 30 tablet 11  . fenofibrate 160 MG tablet TAKE 1 TABLET BY MOUTH EVERY DAY 30 tablet 5  . glipiZIDE (GLUCOTROL) 10 MG tablet TAKE 1 TABLET BY MOUTH EVERY DAY 30 tablet 5  . hydrochlorothiazide (HYDRODIURIL) 25 MG tablet Take 1 tablet (25 mg total) by mouth  daily. 90 tablet 3  . metFORMIN (GLUCOPHAGE) 1000 MG tablet TAKE 1 TABLET BY MOUTH TWICE A DAY WITH A MEAL 60 tablet 5  . simvastatin (ZOCOR) 20 MG tablet TAKE 1 TABLET BY MOUTH EVERY EVENING 30 tablet 5   No current facility-administered medications for this visit.    Allergies  Allergen Reactions  . Penicillins     unsure    Social History   Social History  . Marital Status: Married    Spouse Name: N/A  . Number of Children: 2  . Years of Education: N/A   Occupational History  . Truck driver   .     Social History Main Topics  . Smoking status: Former Smoker -- 1.50 packs/day for 25 years    Types: Cigarettes    Quit date: 08/12/1996  . Smokeless tobacco: Former Systems developer    Types: Chew    Quit date: 08/12/1996  . Alcohol Use: No     Comment: occasionally  . Drug Use: No  . Sexual Activity: Not on file   Other Topics Concern  . Not on file   Social History Narrative     Review of Systems: General: negative for chills, fever, night sweats or weight changes.  Cardiovascular: negative for chest pain, dyspnea on exertion, edema, orthopnea, palpitations, paroxysmal nocturnal dyspnea or shortness of breath Dermatological: negative for rash Respiratory: negative for cough or wheezing Urologic: negative for hematuria Abdominal: negative for nausea, vomiting, diarrhea, bright  red blood per rectum, melena, or hematemesis Neurologic: negative for visual changes, syncope, or dizziness All other systems reviewed and are otherwise negative except as noted above.    Blood pressure 150/80, pulse 55, height 5' 10"  (1.778 m), weight 226 lb (102.513 kg).  General appearance: alert and no distress Neck: no adenopathy, no carotid bruit, no JVD, supple, symmetrical, trachea midline and thyroid not enlarged, symmetric, no tenderness/mass/nodules Lungs: clear to auscultation bilaterally Heart: regular rate and rhythm, S1, S2 normal, no murmur, click, rub or gallop Extremities:  extremities normal, atraumatic, no cyanosis or edema and 2+ pedal pulses bilaterally  EKG not performed today  ASSESSMENT AND PLAN:   Peripheral arterial disease [facility]) Matthew Chen was referred by Matthew Chen for peripheral arterial evaluation. He has a history of claudication the last several months with Doppler suggesting ABIs in the 0.8 range with high-grade distal SFA or popliteal disease. He does have hypertension, hyperlipidemia and diabetes as well as remote tobacco abuse. We discussed percutaneous investigation which she wishes to pursue. I will schedule this for tomorrow      Matthew Harp MD [facility], [facility] 06/28/2015 12:11 PM

## 2015-06-29 NOTE — Progress Notes (Signed)
Site area: right groin  Site Prior to Removal:  Level 0  Pressure Applied For 20 MINUTES    Minutes Beginning at 1510  Manual:   Yes.    Patient Status During Pull:  stable  Post Pull Groin Site:  Level 0  Post Pull Instructions Given:  Yes.    Post Pull Pulses Present:  Yes.    Dressing Applied:  Yes.    Comments:  Checked frequently throughout shift with no change in assessment, dressing dry and intact, no bleeding or hematoma

## 2015-06-29 NOTE — Interval H&P Note (Signed)
History and Physical Interval Note:  06/29/2015 10:42 AM  Matthew Chen  has presented today for surgery, with the diagnosis of claudication  The various methods of treatment have been discussed with the patient and family. After consideration of risks, benefits and other options for treatment, the patient has consented to  Procedure(s): Lower Extremity Angiography (N/A) as a surgical intervention .  The patient's history has been reviewed, patient examined, no change in status, stable for surgery.  I have reviewed the patient's chart and labs.  Questions were answered to the patient's satisfaction.     Quay Burow

## 2015-06-30 ENCOUNTER — Encounter (HOSPITAL_COMMUNITY): Payer: Self-pay | Admitting: *Deleted

## 2015-06-30 DIAGNOSIS — I739 Peripheral vascular disease, unspecified: Secondary | ICD-10-CM | POA: Diagnosis not present

## 2015-06-30 DIAGNOSIS — I70212 Atherosclerosis of native arteries of extremities with intermittent claudication, left leg: Secondary | ICD-10-CM | POA: Diagnosis not present

## 2015-06-30 LAB — BASIC METABOLIC PANEL
Anion gap: 7 (ref 5–15)
BUN: 18 mg/dL (ref 6–20)
CO2: 29 mmol/L (ref 22–32)
CREATININE: 1.45 mg/dL — AB (ref 0.61–1.24)
Calcium: 9 mg/dL (ref 8.9–10.3)
Chloride: 102 mmol/L (ref 101–111)
GFR, EST AFRICAN AMERICAN: 59 mL/min — AB (ref >60)
GFR, EST NON AFRICAN AMERICAN: 51 mL/min — AB (ref >60)
Glucose, Bld: 123 mg/dL — ABNORMAL HIGH (ref 65–99)
POTASSIUM: 3.8 mmol/L (ref 3.5–5.1)
SODIUM: 138 mmol/L (ref 135–145)

## 2015-06-30 LAB — GLUCOSE, CAPILLARY: Glucose-Capillary: 117 mg/dL — ABNORMAL HIGH (ref 65–99)

## 2015-06-30 LAB — CBC
HCT: 37.3 % — ABNORMAL LOW (ref 39.0–52.0)
HEMOGLOBIN: 12.3 g/dL — AB (ref 13.0–17.0)
MCH: 30.6 pg (ref 26.0–34.0)
MCHC: 33 g/dL (ref 30.0–36.0)
MCV: 92.8 fL (ref 78.0–100.0)
Platelets: 194 10*3/uL (ref 150–400)
RBC: 4.02 MIL/uL — AB (ref 4.22–5.81)
RDW: 13.2 % (ref 11.5–15.5)
WBC: 7.5 10*3/uL (ref 4.0–10.5)

## 2015-06-30 MED ORDER — FUROSEMIDE 10 MG/ML IJ SOLN: 40.0000 mg | Freq: Once | INTRAMUSCULAR | Status: DC

## 2015-06-30 MED ORDER — CLOPIDOGREL BISULFATE 75 MG PO TABS: 75.0000 mg | ORAL_TABLET | Freq: Every day | ORAL | Status: DC

## 2015-06-30 NOTE — Progress Notes (Signed)
Patient Name: Matthew Chen Date of Encounter: 06/30/2015   SUBJECTIVE  Feeling well. No chest pain, sob or palpitations.   CURRENT MEDS . amLODipine  10 mg Oral Daily  . aspirin EC  325 mg Oral Daily  . atorvastatin  40 mg Oral Daily  . benazepril  40 mg Oral Daily  . canagliflozin  100 mg Oral Daily  . carvedilol  25 mg Oral BID  . clopidogrel  75 mg Oral Q breakfast  . fenofibrate  160 mg Oral Daily  . glipiZIDE  10 mg Oral QAC breakfast  . hydrochlorothiazide  25 mg Oral Daily  . insulin aspart  0-15 Units Subcutaneous TID WC  . insulin aspart  0-5 Units Subcutaneous QHS    OBJECTIVE  Filed Vitals:   06/29/15 1800 06/29/15 1900 06/29/15 2000 06/30/15 0327  BP: 107/61 125/71 124/81 112/61  Pulse:  64 69 55  Temp:  98.2 F (36.8 C)  97.8 F (36.6 C)  TempSrc:  Oral  Oral  Resp: 13 14 17 12   Height:      Weight:    225 lb 5 oz (102.2 kg)  SpO2:  97% 96% 97%    Intake/Output Summary (Last 24 hours) at 06/30/15 0759 Last data filed at 06/30/15 0000  Gross per 24 hour  Intake 1008.75 ml  Output   1650 ml  Net -641.25 ml   Filed Weights   06/29/15 0932 06/30/15 0327  Weight: 225 lb (102.059 kg) 225 lb 5 oz (102.2 kg)    PHYSICAL EXAM  General: Pleasant, NAD. Neuro: Alert and oriented X 3. Moves all extremities spontaneously. Psych: Normal affect. HEENT:  Normal  Neck: Supple without bruits or JVD. Lungs:  Resp regular and unlabored, CTA. Heart: RRR no s3, s4, or murmurs. Abdomen: Soft, non-tender, non-distended, BS + x 4.  Extremities: No clubbing, cyanosis or edema. DP/PT/Radials 2+ and equal bilaterally. R groin cath site has mild bruise, no hematoma or bruit.   Accessory Clinical Findings  CBC  Recent Labs  06/28/15 1324 06/30/15 0604  WBC 6.2 7.5  NEUTROABS 3.1  --   HGB 14.2 12.3*  HCT 41.0 37.3*  MCV 90.3 92.8  PLT 256 828   Basic Metabolic Panel  Recent Labs  06/28/15 1324 06/30/15 0604  NA 140 138  K 4.5 3.8  CL 100 102    CO2 27 29  GLUCOSE 177* 123*  BUN 21 18  CREATININE 1.51* 1.45*  CALCIUM 10.0 9.0    TELE  NSR at rate of 50-60s.   PV Angiogram/Intervention 06/29/15 Procedures Performed: 1. Abdominal aortogram/bilateral iliac angiogram/bifemoral runoff 2. Contralateral access, placement of NAV 6 6 distal protection device in the above-the-knee popliteal artery on the left 3. Diamondback orbital rotational atherectomy left SFA 4. PT A left SFA Angiographic Data:   1: Abdominal aortogram-distal abdominal aorta was free of significant disease. There was a 20th 30% stenosis at the origin of the left common iliac artery with a narrow bifurcation. 2: Left lower extremity-95% calcified eccentric, exophytic plaque in the adductor canal with three-vessel runoff 3: Right lower extremity-95% exophytic plaque in the P2 segment of the right popliteal artery with three-vessel runoff  Final Impression: successful diamondback orbital rotational atherectomy/PTA of exophytic high-grade distal left SFA stenosis. The sheath will be removed and pressure held once the ACT falls below 170. The patient will be hydrated overnight and discharged home in the morning on dual antiplatelet therapy. The plan will be to stage right popliteal intervention  for the week after Thanksgiving. He left the lab in stable condition.  Echo 05/15/15 LV EF: 65% -  70%  ------------------------------------------------------------------- Indications:   (R01.1).  ------------------------------------------------------------------- History:  PMH: Acquired from the patient and from the patient&'s chart. Murmur. Risk factors: Hypertension. Diabetes mellitus. Dyslipidemia.  ------------------------------------------------------------------- Study Conclusions  - Left ventricle: The cavity size was normal. There was mild concentric hypertrophy. Systolic function was vigorous.  The estimated ejection fraction was in the range of 65% to 70%. Wall motion was normal; there were no regional wall motion abnormalities. Features are consistent with a pseudonormal left ventricular filling pattern, with concomitant abnormal relaxation and increased filling pressure (grade 2 diastolic dysfunction). Doppler parameters are consistent with elevated ventricular end-diastolic filling pressure. - Aortic valve: Bicuspid; severely thickened, severely calcified leaflets. Valve mobility was restricted. There was mild stenosis. There was trivial regurgitation. Mean gradient (S): 8 mm Hg. Peak gradient (S): 17 mm Hg. - Aortic root: The aortic root was dilated measuring 44 mm. - Ascending aorta: The ascending aorta was mildly dilated measuring 40 mm. - Mitral valve: Calcified annulus. Mildly thickened leaflets . There was mild regurgitation. - Left atrium: The atrium was mildly dilated. - Right atrium: The atrium was normal in size. - Pulmonary arteries: Systolic pressure couldn&'t be assessed as there was no tricuspid regurgitation. - Inferior vena cava: The vessel was normal in size. - Pericardium, extracardiac: There was no pericardial effusion.  Impressions:  - Bicuspid arotic valve with trivial regurgitation and mild aortic stenosis. Mildly dilated aortic root and ascending aorta.   Radiology/Studies  Dg Chest 2 View  06/28/2015  CLINICAL DATA:  Preoperative exam ; history of peripheral vascular disease, inflammatory bowel disease, hypertension. EXAM: CHEST  2 VIEW COMPARISON:  None in PACs FINDINGS: The lungs are adequately inflated. There is no focal infiltrate. There is no pleural effusion. The heart and pulmonary vascularity are normal. The mediastinum is normal in width. The bony thorax exhibits no acute abnormality. IMPRESSION: There is no active cardiopulmonary disease. Electronically Signed   By: David  Martinique M.D.   On: 06/28/2015  16:13    ASSESSMENT AND PLAN  1. Claudication [facility]) - s/p successful diamondback orbital rotational atherectomy/PTA of exophytic high-grade distal left SFA stenosis. The plan will be to stage right popliteal intervention for the week after Thanksgiving. Office will call with date and time.   2. HTN - Continue home regimen  3. AS - Echo with LV EF of 65-70%, grade 2 DD, Bicuspid arotic valve with trivial regurgitation and mild aortic stenosis. Mildly dilated aortic root and ascending aorta. F/u with Dr. Oval Linsey.   4. DM - continue home regimen  5. HL - Continue statin  6. Elevated creatinine - seems baseline creatinine of 1.3-1.5. Held metformin 3 days prior to procedure. Held for another 2 days. Check BMET prior to staged procedure.   Signed, Leanor Kail [name] Pager [phone]  Personally seen and examined. Agree with above. OK for DC Several questions answered Needs work note - out until next Tuesday Leg warm  Candee Furbish, MD

## 2015-06-30 NOTE — Care Management Note (Signed)
Case Management Note  Patient Details  Name: Matthew Chen MRN: [id number] Date of Birth: [birth year]  Subjective/Objective:  Patient is for dc today, no needs.                   Action/Plan:   Expected Discharge Date:                  Expected Discharge Plan:  Home/Self Care  In-House Referral:     Discharge planning Services  CM Consult  Post Acute Care Choice:    Choice offered to:     DME Arranged:    DME Agency:     HH Arranged:    [place] Agency:     Status of Service:  Completed, signed off  Medicare Important Message Given:    Date Medicare IM Given:    Medicare IM give by:    Date Additional Medicare IM Given:    Additional Medicare Important Message give by:     If discussed at [place] of Stay Meetings, dates discussed:    Additional Comments:  Zenon Mayo, RN 06/30/2015, 11:09 AM

## 2015-06-30 NOTE — Progress Notes (Signed)
IV removed per discharge order. Discharge instructions given and explained to patient with teach back. Discharged via wheelchair to wife's care with volunteer services escort.

## 2015-06-30 NOTE — Discharge Summary (Signed)
Discharge Summary   Patient ID: Matthew Chen,  MRN: [id number], DOB/AGE: [birth year] 59 y.o.  Admit date: 06/29/2015 Discharge date: 06/30/2015  Primary Care Provider: [facility] Primary Cardiologist: Dr. Oval Linsey PV: [name]  Discharge Diagnoses Active Problems:   Peripheral arterial disease ([place])   Claudication ([place])   HTN   AS   DM   HL   Elevated creatinine   Allergies Allergies  Allergen Reactions  . Penicillins     unsure    Consultant: None  Procedures  PV Angiogram/Intervention 06/29/15 Procedures Performed: 1. Abdominal aortogram/bilateral iliac angiogram/bifemoral runoff 2. Contralateral access, placement of NAV 6 6 distal protection device in the above-the-knee popliteal artery on the left 3. Diamondback orbital rotational atherectomy left SFA 4. PT A left SFA Angiographic Data:   1: Abdominal aortogram-distal abdominal aorta was free of significant disease. There was a 20th 30% stenosis at the origin of the left common iliac artery with a narrow bifurcation. 2: Left lower extremity-95% calcified eccentric, exophytic plaque in the adductor canal with three-vessel runoff 3: Right lower extremity-95% exophytic plaque in the P2 segment of the right popliteal artery with three-vessel runoff  Final Impression: successful diamondback orbital rotational atherectomy/PTA of exophytic high-grade distal left SFA stenosis. The sheath will be removed and pressure held once the ACT falls below 170. The patient will be hydrated overnight and discharged home in the morning on dual antiplatelet therapy. The plan will be to stage right popliteal intervention for the week after Thanksgiving. He left the lab in stable condition.   History of Present Illness  Mr. Ackers is a delightful 59 year old mild to moderately overweight married Caucasian male father of 4 children, grandfather of a grandchildren accompanied by  his wife [name] today. He was referred by Dr. Skeet Latch for evaluation and treatment of claudication. He has a history of treated hypertension, diabetes and hyperlipidemia. 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 He's never had a heart attack or stroke and denies chest pain or shortness of breath. He does complain of lifestyle limiting claudication and had arterial Doppler studies performed in our office 05/29/15 revealing high-grade right popliteal and distal left SFA disease. Based on this, we decided to proceed with percutaneous revascularization.  Recent echo 05/15/15 showed LV EF of 65-70%, grade 2 DD, Bicuspid arotic valve with trivial regurgitation and mild aortic stenosis. Mildly dilated aortic root and ascending aorta.   Hospital Course  The patient presented for scheduled procedure. s/p successful diamondback orbital rotational atherectomy/PTA of exophytic high-grade distal left SFA stenosis. The plan will be to stage right popliteal intervention for the week after Thanksgiving. DAPT with asa and plavix. Ambulated well. His metformin held 3 days prior to procedure. Held for another 2 days post discharge. Check BMET prior to staged procedure.   She has been seen by Dr. Marlou Porch today and deemed ready for discharge home. All follow-up appointments have been scheduled. Discharge medications are listed below.   F/u with Dr. Oval Linsey regarding AS and dilated aortic root and ascending aorta after staged procedure.   Discharge Vitals Blood pressure 123/68, pulse 63, temperature 98 F (36.7 C), temperature source Oral, resp. rate 12, height 5' 10"  (1.778 m), weight 225 lb 5 oz (102.2 kg), SpO2 97 %.  Filed Weights   06/29/15 0932 06/30/15 0327  Weight: 225 lb (102.059 kg) 225 lb 5 oz (102.2 kg)    CBC  Recent Labs  06/28/15 1324 06/30/15 0604  WBC 6.2 7.5  NEUTROABS 3.1  --   HGB 14.2 12.3*  HCT 41.0 37.3*  MCV 90.3 92.8  PLT 256 448    Basic Metabolic Panel  Recent Labs  06/28/15 1324 06/30/15 0604  NA 140 138  K 4.5 3.8  CL 100 102  CO2 27 29  GLUCOSE 177* 123*  BUN 21 18  CREATININE 1.51* 1.45*  CALCIUM 10.0 9.0    Disposition  Pt is being discharged home today in good condition.  Follow-up Plans & Appointments  Follow-up Information    Follow up with Quay Burow, MD. Go on 07/04/2015.   Specialties:  Cardiology, Radiology   Why:  @7 :30am   Contact information:   [address] [place] [address] [phone]           Discharge Instructions    Call MD for:  redness, tenderness, or signs of infection (pain, swelling, redness, odor or green/yellow discharge around incision site)    Complete by:  As directed      Diet - low sodium heart healthy    Complete by:  As directed      Discharge instructions    Complete by:  As directed   No driving for 48 hours. No lifting over 5 lbs for 1 week. No sexual activity for 1 week. You may return to work on 07/05/15. Keep procedure site clean & dry. If you notice increased pain, swelling, bleeding or pus, call/return!  You may shower, but no soaking baths/hot tubs/pools for 1 week.   Continue to hold metformin, resume 07/02/15.     Increase activity slowly    Complete by:  As directed            F/u Labs/Studies: Bmet during post hospital visit.   Discharge Medications    Medication List    TAKE these medications        ALPRAZolam 1 MG tablet  Commonly known as:  XANAX  Take 1 tablet (1 mg total) by mouth at bedtime as needed for anxiety or sleep.     amLODipine 10 MG tablet  Commonly known as:  NORVASC  Take 1 tablet (10 mg total) by mouth daily.     aspirin EC 81 MG tablet  Take 1 tablet (81 mg total) by mouth daily.     atorvastatin 40 MG tablet  Commonly known as:  LIPITOR  Take 1 tablet (40 mg total) by mouth daily.     balsalazide 750 MG capsule  Commonly known as:  COLAZAL  TAKE 3 CAPSULES BY MOUTH 3  TIMES A Matthew     benazepril 40 MG tablet  Commonly known as:  LOTENSIN  TAKE 1 TABLET BY MOUTH EVERY Matthew     carvedilol 25 MG tablet  Commonly known as:  COREG  Take 1 tablet (25 mg total) by mouth 2 (two) times daily.     clopidogrel 75 MG tablet  Commonly known as:  PLAVIX  Take 1 tablet (75 mg total) by mouth daily with breakfast.     dapagliflozin propanediol 10 MG Tabs tablet  Commonly known as:  FARXIGA  Take 10 mg by mouth daily.     fenofibrate 160 MG tablet  TAKE 1 TABLET BY MOUTH EVERY Matthew     glipiZIDE 10 MG tablet  Commonly known as:  GLUCOTROL  TAKE 1 TABLET BY MOUTH EVERY Matthew     hydrochlorothiazide 25 MG tablet  Commonly known as:  HYDRODIURIL  Take 1 tablet (  25 mg total) by mouth daily.     metFORMIN 1000 MG tablet  Commonly known as:  GLUCOPHAGE  TAKE 1 TABLET BY MOUTH TWICE A Matthew WITH A MEAL        Duration of Discharge Encounter   Greater than 30 minutes including physician time.  Signed, Bhagat,Bhavinkumar [name] 06/30/2015, 12:09 PM    Personally seen and examined. Agree with above. OK for DC Several questions answered Needs work note - out until next Tuesday Leg warm  Candee Furbish, MD

## 2015-07-04 ENCOUNTER — Encounter: Payer: Self-pay | Admitting: Cardiovascular Disease

## 2015-07-04 ENCOUNTER — Ambulatory Visit: Payer: BLUE CROSS/BLUE SHIELD | Admitting: Cardiovascular Disease

## 2015-07-04 ENCOUNTER — Ambulatory Visit (INDEPENDENT_AMBULATORY_CARE_PROVIDER_SITE_OTHER): Payer: BLUE CROSS/BLUE SHIELD | Admitting: Cardiovascular Disease

## 2015-07-04 VITALS — BP 132/78 | HR 78 | Ht 70.0 in | Wt 227.4 lb

## 2015-07-04 DIAGNOSIS — I739 Peripheral vascular disease, unspecified: Secondary | ICD-10-CM | POA: Diagnosis not present

## 2015-07-04 LAB — CBC WITH DIFFERENTIAL/PLATELET
BASOS ABS: 0.1 10*3/uL (ref 0.0–0.1)
BASOS PCT: 1 % (ref 0–1)
EOS ABS: 0.3 10*3/uL (ref 0.0–0.7)
Eosinophils Relative: 3 % (ref 0–5)
HCT: 40.7 % (ref 39.0–52.0)
Hemoglobin: 13.5 g/dL (ref 13.0–17.0)
LYMPHS PCT: 22 % (ref 12–46)
Lymphs Abs: 1.9 10*3/uL (ref 0.7–4.0)
MCH: 30.7 pg (ref 26.0–34.0)
MCHC: 33.2 g/dL (ref 30.0–36.0)
MCV: 92.5 fL (ref 78.0–100.0)
MPV: 11.2 fL (ref 8.6–12.4)
Monocytes Absolute: 1 10*3/uL (ref 0.1–1.0)
Monocytes Relative: 11 % (ref 3–12)
NEUTROS PCT: 63 % (ref 43–77)
Neutro Abs: 5.5 10*3/uL (ref 1.7–7.7)
PLATELETS: 259 10*3/uL (ref 150–400)
RBC: 4.4 MIL/uL (ref 4.22–5.81)
RDW: 13.5 % (ref 11.5–15.5)
WBC: 8.7 10*3/uL (ref 4.0–10.5)

## 2015-07-04 LAB — BASIC METABOLIC PANEL
BUN: 28 mg/dL — AB (ref 7–25)
CHLORIDE: 101 mmol/L (ref 98–110)
CO2: 23 mmol/L (ref 20–31)
CREATININE: 1.52 mg/dL — AB (ref 0.70–1.33)
Calcium: 9.8 mg/dL (ref 8.6–10.3)
Glucose, Bld: 178 mg/dL — ABNORMAL HIGH (ref 65–99)
POTASSIUM: 4.6 mmol/L (ref 3.5–5.3)
Sodium: 137 mmol/L (ref 135–146)

## 2015-07-04 NOTE — Progress Notes (Signed)
07/04/2015 Matthew Chen   07-Jun-1956  [id number]  Primary Physician Odette Fraction, MD Primary Cardiologist: Lorretta Harp MD Renae Gloss   HPI:  [name] is a delightful 59 year old mild to moderately overweight married Caucasian male father of 4 children, grandfather of a grandchildren accompanied by his wife [name] today. He was referred by Dr. Skeet Latch for evaluation and treatment of claudication. He has a history of treated hypertension, diabetes and hyperlipidemia. 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 He's never had a heart attack or stroke and denies chest pain or shortness of breath. He does complain of lifestyle limiting claudication and had arterial Doppler studies performed in our office 05/29/15 revealing high-grade right popliteal and distal left SFA disease. Based on this, we decided to proceed with angiography and potential percutaneous revascularization which was performed on 06/28/15 revealing high-grade calcific/exophytic disease with distal left SFA and right popliteal arteries. I performed diamondback orbital rotational atherectomy, PTA of his left SFA using distal protection with excellent angiographic and clinical result. He does have a 2+ left pedal pulse. He presents now for diamondback orbital rotation atherectomy of his right popliteal artery.   Current Outpatient Prescriptions  Medication Sig Dispense Refill  . ALPRAZolam (XANAX) 1 MG tablet Take 1 tablet (1 mg total) by mouth at bedtime as needed for anxiety or sleep. 20 tablet 0  . amLODipine (NORVASC) 10 MG tablet Take 1 tablet (10 mg total) by mouth daily. (Patient taking differently: Take 5 mg by mouth daily. ) 30 tablet 11  . aspirin EC 81 MG tablet Take 1 tablet (81 mg total) by mouth daily. 90 tablet 3  . atorvastatin (LIPITOR) 40 MG tablet Take 1 tablet (40 mg total) by mouth daily. 30 tablet 11  . balsalazide (COLAZAL) 750 MG capsule  TAKE 3 CAPSULES BY MOUTH 3 TIMES A DAY 270 capsule 9  . benazepril (LOTENSIN) 40 MG tablet TAKE 1 TABLET BY MOUTH EVERY DAY 30 tablet 5  . carvedilol (COREG) 25 MG tablet Take 1 tablet (25 mg total) by mouth 2 (two) times daily. 60 tablet 11  . clopidogrel (PLAVIX) 75 MG tablet Take 1 tablet (75 mg total) by mouth daily with breakfast. 30 tablet 11  . dapagliflozin propanediol (FARXIGA) 10 MG TABS tablet Take 10 mg by mouth daily. 30 tablet 11  . fenofibrate 160 MG tablet TAKE 1 TABLET BY MOUTH EVERY DAY 30 tablet 5  . glipiZIDE (GLUCOTROL) 10 MG tablet TAKE 1 TABLET BY MOUTH EVERY DAY (Patient not taking: Reported on 06/28/2015) 30 tablet 5  . hydrochlorothiazide (HYDRODIURIL) 25 MG tablet Take 1 tablet (25 mg total) by mouth daily. 90 tablet 3  . metFORMIN (GLUCOPHAGE) 1000 MG tablet TAKE 1 TABLET BY MOUTH TWICE A DAY WITH A MEAL 60 tablet 5   No current facility-administered medications for this visit.    Allergies  Allergen Reactions  . Penicillins     unsure    Social History   Social History  . Marital Status: Married    Spouse Name: N/A  . Number of Children: 2  . Years of Education: N/A   Occupational History  . Truck driver   .     Social History Main Topics  . Smoking status: Former Smoker -- 1.50 packs/day for 25 years    Types: Cigarettes    Quit date: 08/12/1996  . Smokeless tobacco: Never Used  . Alcohol Use: 0.6 oz/week  1 Cans of beer per week     Comment: occasionally  . Drug Use: No  . Sexual Activity: Yes   Other Topics Concern  . Not on file   Social History Narrative     Review of Systems: General: negative for chills, fever, night sweats or weight changes.  Cardiovascular: negative for chest pain, dyspnea on exertion, edema, orthopnea, palpitations, paroxysmal nocturnal dyspnea or shortness of breath Dermatological: negative for rash Respiratory: negative for cough or wheezing Urologic: negative for hematuria Abdominal: negative for  nausea, vomiting, diarrhea, bright red blood per rectum, melena, or hematemesis Neurologic: negative for visual changes, syncope, or dizziness All other systems reviewed and are otherwise negative except as noted above.    Blood pressure 132/78, pulse 78, height 5' 10"  (1.778 m), weight 227 lb 6.4 oz (103.148 kg).  General appearance: alert and no distress Neck: no adenopathy, no carotid bruit, no JVD, supple, symmetrical, trachea midline and thyroid not enlarged, symmetric, no tenderness/mass/nodules Lungs: clear to auscultation bilaterally Heart: regular rate and rhythm, S1, S2 normal, no murmur, click, rub or gallop Extremities: 2+ left pedal pulse, 1+ right pedal pulse, moderate ecchymosis right femoral artery puncture site  EKG not performed today  ASSESSMENT AND PLAN:   Peripheral arterial disease [facility]) [name] returns today for postprocedure follow-up. He had diamondback orbital rotation atherectomy, PTCA of a high-grade exophytic calcific distal left SFA stenosis and excellent angiographic result by myself 06/28/15. He does have a 2+ left pedal pulse. He has residual high-grade disease in his right P2 segment there is calcific as well. We'll plan on performing staged diamondback orbital rotation arthrectomy of his right popliteal artery using distal protection on December 1.      Lorretta Harp MD FACP,FACC,FAHA, [facility] 07/04/2015 7:59 AM

## 2015-07-04 NOTE — Patient Instructions (Addendum)
Medication Instructions:  Your physician recommends that you continue on your current medications as directed. Please refer to the Current Medication list given to you today.   Labwork: Your physician recommends that you return for lab work in: TODAY (BMET/CBC) The lab can be found on the FIRST FLOOR of out building in [address]   Testing/Procedures: [name] has ordered a peripheral angiogram to be done at [facility].  This procedure is going to look at the bloodflow in your lower extremities.  If [name] is able to open up the arteries, you will have to spend one night in the hospital.  If he is not able to open the arteries, you will be able to go home that same day.  SCHEDULE 12/1  After the procedure, you will not be allowed to drive for 3 days or push, pull, or lift anything greater than 10 lbs for one week.    You will be required to have the following tests prior to the procedure:  1. Blood work-the blood work can be done no more than 7 days prior to the procedure.  It can be done at any [facility] lab.  There is one downstairs on the first floor of this building and one in the [facility] building [phone] [address])  *REPS: ERIC  Puncture site: Left groin     Any Other Special Instructions Will Be Listed Below (If Applicable).     If you need a refill on your cardiac medications before your next appointment, please call your pharmacy.

## 2015-07-04 NOTE — Assessment & Plan Note (Signed)
Matthew Chen returns today for postprocedure follow-up. He had diamondback orbital rotation atherectomy, PTCA of a high-grade exophytic calcific distal left SFA stenosis and excellent angiographic result by myself 06/28/15. He does have a 2+ left pedal pulse. He has residual high-grade disease in his right P2 segment there is calcific as well. We'll plan on performing staged diamondback orbital rotation arthrectomy of his right popliteal artery using distal protection on December 1.

## 2015-07-13 ENCOUNTER — Ambulatory Visit (HOSPITAL_COMMUNITY)
Admission: RE | Admit: 2015-07-13 | Discharge: 2015-07-14 | Disposition: A | Discharge status: Home / Self Care | Payer: BLUE CROSS/BLUE SHIELD | Source: Ambulatory Visit | Attending: Cardiovascular Disease | Admitting: Cardiovascular Disease

## 2015-07-13 ENCOUNTER — Encounter (HOSPITAL_COMMUNITY)
Admission: RE | Disposition: A | Discharge status: Home / Self Care | Payer: Self-pay | Source: Ambulatory Visit | Attending: Cardiovascular Disease

## 2015-07-13 ENCOUNTER — Encounter (HOSPITAL_COMMUNITY): Payer: Self-pay | Admitting: Cardiovascular Disease

## 2015-07-13 ENCOUNTER — Telehealth: Payer: Self-pay | Admitting: *Deleted

## 2015-07-13 DIAGNOSIS — E118 Type 2 diabetes mellitus with unspecified complications: Secondary | ICD-10-CM

## 2015-07-13 DIAGNOSIS — Z6832 Body mass index (BMI) 32.0-32.9, adult: Secondary | ICD-10-CM | POA: Insufficient documentation

## 2015-07-13 DIAGNOSIS — N183 Chronic kidney disease, stage 3 unspecified: Secondary | ICD-10-CM | POA: Diagnosis present

## 2015-07-13 DIAGNOSIS — I739 Peripheral vascular disease, unspecified: Secondary | ICD-10-CM

## 2015-07-13 DIAGNOSIS — Z7902 Long term (current) use of antithrombotics/antiplatelets: Secondary | ICD-10-CM | POA: Diagnosis not present

## 2015-07-13 DIAGNOSIS — Z79899 Other long term (current) drug therapy: Secondary | ICD-10-CM | POA: Diagnosis not present

## 2015-07-13 DIAGNOSIS — E1122 Type 2 diabetes mellitus with diabetic chronic kidney disease: Secondary | ICD-10-CM | POA: Insufficient documentation

## 2015-07-13 DIAGNOSIS — Z87891 Personal history of nicotine dependence: Secondary | ICD-10-CM | POA: Insufficient documentation

## 2015-07-13 DIAGNOSIS — E785 Hyperlipidemia, unspecified: Secondary | ICD-10-CM | POA: Diagnosis not present

## 2015-07-13 DIAGNOSIS — I129 Hypertensive chronic kidney disease with stage 1 through stage 4 chronic kidney disease, or unspecified chronic kidney disease: Secondary | ICD-10-CM | POA: Insufficient documentation

## 2015-07-13 DIAGNOSIS — E663 Overweight: Secondary | ICD-10-CM | POA: Diagnosis not present

## 2015-07-13 DIAGNOSIS — I70211 Atherosclerosis of native arteries of extremities with intermittent claudication, right leg: Secondary | ICD-10-CM

## 2015-07-13 DIAGNOSIS — Z7982 Long term (current) use of aspirin: Secondary | ICD-10-CM | POA: Diagnosis not present

## 2015-07-13 DIAGNOSIS — Z7984 Long term (current) use of oral hypoglycemic drugs: Secondary | ICD-10-CM | POA: Insufficient documentation

## 2015-07-13 DIAGNOSIS — Z9862 Peripheral vascular angioplasty status: Secondary | ICD-10-CM | POA: Diagnosis not present

## 2015-07-13 DIAGNOSIS — I10 Essential (primary) hypertension: Secondary | ICD-10-CM | POA: Diagnosis present

## 2015-07-13 DIAGNOSIS — Z794 Long term (current) use of insulin: Secondary | ICD-10-CM

## 2015-07-13 HISTORY — PX: PERIPHERAL VASCULAR CATHETERIZATION: SHX172C

## 2015-07-13 HISTORY — DX: Chronic kidney disease, stage 3 unspecified: N18.30

## 2015-07-13 HISTORY — DX: Chronic kidney disease, stage 3 (moderate): N18.3

## 2015-07-13 LAB — GLUCOSE, CAPILLARY
GLUCOSE-CAPILLARY: 169 mg/dL — AB (ref 65–99)
GLUCOSE-CAPILLARY: 162 mg/dL — AB (ref 65–99)
Glucose-Capillary: 149 mg/dL — ABNORMAL HIGH (ref 65–99)
Glucose-Capillary: 143 mg/dL — ABNORMAL HIGH (ref 65–99)

## 2015-07-13 LAB — POCT ACTIVATED CLOTTING TIME
ACTIVATED CLOTTING TIME: 196 s
ACTIVATED CLOTTING TIME: 232 s
ACTIVATED CLOTTING TIME: 232 s
Activated Clotting Time: 239 seconds
Activated Clotting Time: 171 seconds

## 2015-07-13 SURGERY — LOWER EXTREMITY ANGIOGRAPHY

## 2015-07-13 MED ORDER — HEPARIN SODIUM (PORCINE) 1000 UNIT/ML IJ SOLN
INTRAMUSCULAR | Status: AC
  Filled 2015-07-13: qty 1

## 2015-07-13 MED ORDER — ATORVASTATIN CALCIUM 40 MG PO TABS: 40.0000 mg | ORAL_TABLET | Freq: Every day | ORAL | Status: DC

## 2015-07-13 MED ORDER — ASPIRIN 81 MG PO CHEW
81.0000 mg | CHEWABLE_TABLET | ORAL | Status: AC
  Administered 2015-07-13: 81 mg via ORAL

## 2015-07-13 MED ORDER — HEPARIN SODIUM (PORCINE) 1000 UNIT/ML IJ SOLN
INTRAMUSCULAR | Status: DC | PRN
  Administered 2015-07-13: 2500 [IU] via INTRAVENOUS
  Administered 2015-07-13: 4000 [IU] via INTRAVENOUS
  Administered 2015-07-13: 6000 [IU] via INTRAVENOUS
  Administered 2015-07-13: 2500 [IU] via INTRAVENOUS

## 2015-07-13 MED ORDER — FENOFIBRATE 160 MG PO TABS: 160.0000 mg | ORAL_TABLET | Freq: Every day | ORAL | Status: DC

## 2015-07-13 MED ORDER — LIDOCAINE HCL (PF) 1 % IJ SOLN
INTRAMUSCULAR | Status: DC | PRN
  Administered 2015-07-13: 08:00:00

## 2015-07-13 MED ORDER — HYDRALAZINE HCL 20 MG/ML IJ SOLN: 10.0000 mg | INTRAMUSCULAR | Status: DC | PRN

## 2015-07-13 MED ORDER — NITROGLYCERIN IN D5W 200-5 MCG/ML-% IV SOLN
INTRAVENOUS | Status: AC
  Filled 2015-07-13: qty 250

## 2015-07-13 MED ORDER — AMLODIPINE BESYLATE 10 MG PO TABS: 10.0000 mg | ORAL_TABLET | Freq: Every day | ORAL | Status: DC

## 2015-07-13 MED ORDER — SODIUM CHLORIDE 0.9 % IV SOLN: 250.0000 mL | INTRAVENOUS | Status: DC | PRN

## 2015-07-13 MED ORDER — SODIUM CHLORIDE 0.9 % IJ SOLN: 3.0000 mL | Freq: Two times a day (BID) | INTRAMUSCULAR | Status: DC

## 2015-07-13 MED ORDER — ACETAMINOPHEN 325 MG PO TABS: 650.0000 mg | ORAL_TABLET | ORAL | Status: DC | PRN

## 2015-07-13 MED ORDER — NITROGLYCERIN 1 MG/10 ML FOR IR/CATH LAB
INTRA_ARTERIAL | Status: DC | PRN
  Administered 2015-07-13 (×3): 200 ug via INTRA_ARTERIAL

## 2015-07-13 MED ORDER — ALPRAZOLAM 0.5 MG PO TABS: 1.0000 mg | ORAL_TABLET | Freq: Every evening | ORAL | Status: DC | PRN

## 2015-07-13 MED ORDER — ONDANSETRON HCL 4 MG/2ML IJ SOLN: 4.0000 mg | Freq: Four times a day (QID) | INTRAMUSCULAR | Status: DC | PRN

## 2015-07-13 MED ORDER — LIDOCAINE HCL (PF) 1 % IJ SOLN
INTRAMUSCULAR | Status: AC
  Filled 2015-07-13: qty 30

## 2015-07-13 MED ORDER — SODIUM CHLORIDE 0.9 % WEIGHT BASED INFUSION
3.0000 mL/kg/h | INTRAVENOUS | Status: DC
  Administered 2015-07-13: 3 mL/kg/h via INTRAVENOUS

## 2015-07-13 MED ORDER — FENTANYL CITRATE (PF) 100 MCG/2ML IJ SOLN
INTRAMUSCULAR | Status: DC | PRN
  Administered 2015-07-13 (×2): 25 ug via INTRAVENOUS

## 2015-07-13 MED ORDER — MIDAZOLAM HCL 2 MG/2ML IJ SOLN
INTRAMUSCULAR | Status: AC
  Filled 2015-07-13: qty 2

## 2015-07-13 MED ORDER — CARVEDILOL 25 MG PO TABS
25.0000 mg | ORAL_TABLET | Freq: Two times a day (BID) | ORAL | Status: DC
  Administered 2015-07-13: 21:00:00 25 mg via ORAL
  Filled 2015-07-13 (×2): qty 1
  Filled 2015-07-13: qty 2
  Filled 2015-07-13: qty 1

## 2015-07-13 MED ORDER — INSULIN ASPART 100 UNIT/ML ~~LOC~~ SOLN: 0.0000 [IU] | Freq: Three times a day (TID) | SUBCUTANEOUS | Status: DC

## 2015-07-13 MED ORDER — MORPHINE SULFATE (PF) 2 MG/ML IV SOLN: 2.0000 mg | INTRAVENOUS | Status: DC | PRN

## 2015-07-13 MED ORDER — MIDAZOLAM HCL 2 MG/2ML IJ SOLN
INTRAMUSCULAR | Status: DC | PRN
  Administered 2015-07-13 (×2): 1 mg via INTRAVENOUS

## 2015-07-13 MED ORDER — VERAPAMIL HCL 2.5 MG/ML IV SOLN
INTRAVENOUS | Status: AC
  Filled 2015-07-13: qty 2

## 2015-07-13 MED ORDER — HEPARIN (PORCINE) IN NACL 2-0.9 UNIT/ML-% IJ SOLN
INTRAMUSCULAR | Status: AC
  Filled 2015-07-13: qty 1000

## 2015-07-13 MED ORDER — SODIUM CHLORIDE 0.9 % IJ SOLN: 3.0000 mL | INTRAMUSCULAR | Status: DC | PRN

## 2015-07-13 MED ORDER — BENAZEPRIL HCL 40 MG PO TABS
40.0000 mg | ORAL_TABLET | Freq: Every day | ORAL | Status: DC
  Filled 2015-07-13: qty 1

## 2015-07-13 MED ORDER — HYDROCHLOROTHIAZIDE 25 MG PO TABS: 25.0000 mg | ORAL_TABLET | Freq: Every day | ORAL | Status: DC

## 2015-07-13 MED ORDER — VIPERSLIDE LUBRICANT OPTIME
TOPICAL | Status: DC | PRN
  Administered 2015-07-13: 08:00:00 via SURGICAL_CAVITY

## 2015-07-13 MED ORDER — SODIUM CHLORIDE 0.9 % WEIGHT BASED INFUSION: 1.0000 mL/kg/h | INTRAVENOUS | Status: DC

## 2015-07-13 MED ORDER — SODIUM CHLORIDE 0.9 % IV SOLN: INTRAVENOUS | Status: DC

## 2015-07-13 MED ORDER — INSULIN ASPART 100 UNIT/ML ~~LOC~~ SOLN: 0.0000 [IU] | Freq: Every day | SUBCUTANEOUS | Status: DC

## 2015-07-13 MED ORDER — ASPIRIN EC 81 MG PO TBEC: 81.0000 mg | DELAYED_RELEASE_TABLET | Freq: Every day | ORAL | Status: DC

## 2015-07-13 MED ORDER — DAPAGLIFLOZIN PROPANEDIOL 10 MG PO TABS: 10.0000 mg | ORAL_TABLET | Freq: Every day | ORAL | Status: DC

## 2015-07-13 MED ORDER — ASPIRIN 81 MG PO CHEW
CHEWABLE_TABLET | ORAL | Status: AC
  Administered 2015-07-13: 81 mg via ORAL
  Filled 2015-07-13: qty 1

## 2015-07-13 MED ORDER — CLOPIDOGREL BISULFATE 75 MG PO TABS
75.0000 mg | ORAL_TABLET | Freq: Every day | ORAL | Status: DC
  Administered 2015-07-14: 75 mg via ORAL
  Filled 2015-07-13: qty 1

## 2015-07-13 MED ORDER — FENTANYL CITRATE (PF) 100 MCG/2ML IJ SOLN
INTRAMUSCULAR | Status: AC
  Filled 2015-07-13: qty 2

## 2015-07-13 SURGICAL SUPPLY — 26 items
BAG SNAP BAND KOVER 36X36 (MISCELLANEOUS) ×3 IMPLANT
BALLN LUTONIX DCB 5X40X130 (BALLOONS) ×3
BALLOON LUTONIX DCB 5X40X130 (BALLOONS) ×2 IMPLANT
CATH CROSS OVER TEMPO 5F (CATHETERS) ×3 IMPLANT
CATH QUICKCROSS .018X135CM (MICROCATHETER) ×3 IMPLANT
DEVICE EMBOSHIELD NAV6 4.0-7.0 (WIRE) ×3 IMPLANT
DIAMONDBACK SOLID OAS 1.5MM (CATHETERS) ×3
DIAMONDBACK SOLID OAS 2.0MM (CATHETERS) ×3
GUIDEWIRE ANGLED .035X150CM (WIRE) ×3 IMPLANT
GUIDEWIRE REGALIA .014X300CM (WIRE) ×3 IMPLANT
KIT ENCORE 26 ADVANTAGE (KITS) ×3 IMPLANT
KIT PV (KITS) ×3 IMPLANT
LUBRICANT VIPERSLIDE CORONARY (MISCELLANEOUS) ×3 IMPLANT
SHEATH PINNACLE 5F 10CM (SHEATH) ×3 IMPLANT
SHEATH PINNACLE 7F 10CM (SHEATH) ×3 IMPLANT
SHEATH PINNACLE ST 7F 65CM (SHEATH) ×3 IMPLANT
SYR MEDRAD MARK V 150ML (SYRINGE) ×3 IMPLANT
SYSTEM DIMNDBCK SLD OAS 1.5MM (CATHETERS) ×2 IMPLANT
SYSTEM DIMNDBCK SLD OAS 2.0MM (CATHETERS) ×2 IMPLANT
TAPE RADIOPAQUE TURBO (MISCELLANEOUS) ×3 IMPLANT
TRANSDUCER W/STOPCOCK (MISCELLANEOUS) ×6 IMPLANT
TRAY PV CATH (CUSTOM PROCEDURE TRAY) ×3 IMPLANT
TUBING CIL FLEX 10 FLL-RA (TUBING) ×3 IMPLANT
WIRE HITORQ VERSACORE ST 145CM (WIRE) ×3 IMPLANT
WIRE ROSEN-J .035X180CM (WIRE) ×3 IMPLANT
WIRE VIPER ADVANCE .017X335CM (WIRE) ×3 IMPLANT

## 2015-07-13 NOTE — Progress Notes (Signed)
Patient states she he have his wife bring his [place] medication from home for glucose control. Will send to pharmacy to verify when medication arrives.

## 2015-07-13 NOTE — Telephone Encounter (Signed)
-----   Message from Raiford Simmonds, RN sent at 06/01/2015  4:49 PM EDT ----- Lipid ,hepatic  Due before appointment with dr Oval Linsey

## 2015-07-13 NOTE — Interval H&P Note (Signed)
History and Physical Interval Note:  07/13/2015 7:40 AM  [name]  has presented today for surgery, with the diagnosis of claudication  The various methods of treatment have been discussed with the patient and family. After consideration of risks, benefits and other options for treatment, the patient has consented to  Procedure(s): Lower Extremity Angiography (N/A) as a surgical intervention .  The patient's history has been reviewed, patient examined, no change in status, stable for surgery.  I have reviewed the patient's chart and labs.  Questions were answered to the patient's satisfaction.     Quay Burow

## 2015-07-13 NOTE — Progress Notes (Signed)
Site area: left groin  Site Prior to Removal:  Level 1  Pressure Applied For 20 MINUTES    Minutes Beginning at 1025  Manual:   Yes.    Patient Status During Pull:  Patient remained alert and oriented by four. No s/s of distress noted or complaints voiced. Hematoma noted developing prior to removal, resolved post removal.   Post Pull Groin Site:  Level 0  Post Pull Instructions Given:  Yes.    Post Pull Pulses Present:  Yes.    Dressing Applied:  Yes.    Comments:  Pressure dressing applied C/D/I. No complaints of pain. No s/s of distress noted. Patient tolerated well. No hematoma noted.

## 2015-07-13 NOTE — Telephone Encounter (Signed)
Mail letter and labslip

## 2015-07-13 NOTE — H&P (View-Only) (Signed)
07/04/2015 Matthew Chen   03/20/1956  [id number]  Primary Physician Odette Fraction, MD Primary Cardiologist: Lorretta Harp MD Renae Gloss   HPI:  Matthew Chen is a delightful 59 year old mild to moderately overweight married Caucasian male father of 4 children, grandfather of a grandchildren accompanied by his wife [name] today. He was referred by Dr. Skeet Latch for evaluation and treatment of claudication. He has a history of treated hypertension, diabetes and hyperlipidemia. 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 He's never had a heart attack or stroke and denies chest pain or shortness of breath. He does complain of lifestyle limiting claudication and had arterial Doppler studies performed in our office 05/29/15 revealing high-grade right popliteal and distal left SFA disease. Based on this, we decided to proceed with angiography and potential percutaneous revascularization which was performed on 06/28/15 revealing high-grade calcific/exophytic disease with distal left SFA and right popliteal arteries. I performed diamondback orbital rotational atherectomy, PTA of his left SFA using distal protection with excellent angiographic and clinical result. He does have a 2+ left pedal pulse. He presents now for diamondback orbital rotation atherectomy of his right popliteal artery.   Current Outpatient Prescriptions  Medication Sig Dispense Refill  . ALPRAZolam (XANAX) 1 MG tablet Take 1 tablet (1 mg total) by mouth at bedtime as needed for anxiety or sleep. 20 tablet 0  . amLODipine (NORVASC) 10 MG tablet Take 1 tablet (10 mg total) by mouth daily. (Patient taking differently: Take 5 mg by mouth daily. ) 30 tablet 11  . aspirin EC 81 MG tablet Take 1 tablet (81 mg total) by mouth daily. 90 tablet 3  . atorvastatin (LIPITOR) 40 MG tablet Take 1 tablet (40 mg total) by mouth daily. 30 tablet 11  . balsalazide (COLAZAL) 750 MG capsule  TAKE 3 CAPSULES BY MOUTH 3 TIMES A DAY 270 capsule 9  . benazepril (LOTENSIN) 40 MG tablet TAKE 1 TABLET BY MOUTH EVERY DAY 30 tablet 5  . carvedilol (COREG) 25 MG tablet Take 1 tablet (25 mg total) by mouth 2 (two) times daily. 60 tablet 11  . clopidogrel (PLAVIX) 75 MG tablet Take 1 tablet (75 mg total) by mouth daily with breakfast. 30 tablet 11  . dapagliflozin propanediol (FARXIGA) 10 MG TABS tablet Take 10 mg by mouth daily. 30 tablet 11  . fenofibrate 160 MG tablet TAKE 1 TABLET BY MOUTH EVERY DAY 30 tablet 5  . glipiZIDE (GLUCOTROL) 10 MG tablet TAKE 1 TABLET BY MOUTH EVERY DAY (Patient not taking: Reported on 06/28/2015) 30 tablet 5  . hydrochlorothiazide (HYDRODIURIL) 25 MG tablet Take 1 tablet (25 mg total) by mouth daily. 90 tablet 3  . metFORMIN (GLUCOPHAGE) 1000 MG tablet TAKE 1 TABLET BY MOUTH TWICE A DAY WITH A MEAL 60 tablet 5   No current facility-administered medications for this visit.    Allergies  Allergen Reactions  . Penicillins     unsure    Social History   Social History  . Marital Status: Married    Spouse Name: N/A  . Number of Children: 2  . Years of Education: N/A   Occupational History  . Truck driver   .     Social History Main Topics  . Smoking status: Former Smoker -- 1.50 packs/day for 25 years    Types: Cigarettes    Quit date: 08/12/1996  . Smokeless tobacco: Never Used  . Alcohol Use: 0.6 oz/week  1 Cans of beer per week     Comment: occasionally  . Drug Use: No  . Sexual Activity: Yes   Other Topics Concern  . Not on file   Social History Narrative     Review of Systems: General: negative for chills, fever, night sweats or weight changes.  Cardiovascular: negative for chest pain, dyspnea on exertion, edema, orthopnea, palpitations, paroxysmal nocturnal dyspnea or shortness of breath Dermatological: negative for rash Respiratory: negative for cough or wheezing Urologic: negative for hematuria Abdominal: negative for  nausea, vomiting, diarrhea, bright red blood per rectum, melena, or hematemesis Neurologic: negative for visual changes, syncope, or dizziness All other systems reviewed and are otherwise negative except as noted above.    Blood pressure 132/78, pulse 78, height 5' 10"  (1.778 m), weight 227 lb 6.4 oz (103.148 kg).  General appearance: alert and no distress Neck: no adenopathy, no carotid bruit, no JVD, supple, symmetrical, trachea midline and thyroid not enlarged, symmetric, no tenderness/mass/nodules Lungs: clear to auscultation bilaterally Heart: regular rate and rhythm, S1, S2 normal, no murmur, click, rub or gallop Extremities: 2+ left pedal pulse, 1+ right pedal pulse, moderate ecchymosis right femoral artery puncture site  EKG not performed today  ASSESSMENT AND PLAN:   Peripheral arterial disease [facility]) Matthew Chen returns today for postprocedure follow-up. He had diamondback orbital rotation atherectomy, PTCA of a high-grade exophytic calcific distal left SFA stenosis and excellent angiographic result by myself 06/28/15. He does have a 2+ left pedal pulse. He has residual high-grade disease in his right P2 segment there is calcific as well. We'll plan on performing staged diamondback orbital rotation arthrectomy of his right popliteal artery using distal protection on December 1.      Lorretta Harp MD FACP,FACC,FAHA, [facility] 07/04/2015 7:59 AM

## 2015-07-14 ENCOUNTER — Other Ambulatory Visit: Payer: Self-pay | Admitting: Physician Assistant

## 2015-07-14 ENCOUNTER — Encounter (HOSPITAL_COMMUNITY): Payer: Self-pay | Admitting: Physician Assistant

## 2015-07-14 DIAGNOSIS — E785 Hyperlipidemia, unspecified: Secondary | ICD-10-CM

## 2015-07-14 DIAGNOSIS — I739 Peripheral vascular disease, unspecified: Secondary | ICD-10-CM | POA: Diagnosis not present

## 2015-07-14 DIAGNOSIS — I10 Essential (primary) hypertension: Secondary | ICD-10-CM

## 2015-07-14 DIAGNOSIS — Z9862 Peripheral vascular angioplasty status: Secondary | ICD-10-CM

## 2015-07-14 DIAGNOSIS — N183 Chronic kidney disease, stage 3 unspecified: Secondary | ICD-10-CM | POA: Diagnosis present

## 2015-07-14 DIAGNOSIS — I70201 Unspecified atherosclerosis of native arteries of extremities, right leg: Secondary | ICD-10-CM | POA: Diagnosis not present

## 2015-07-14 DIAGNOSIS — Z794 Long term (current) use of insulin: Secondary | ICD-10-CM

## 2015-07-14 DIAGNOSIS — I70211 Atherosclerosis of native arteries of extremities with intermittent claudication, right leg: Secondary | ICD-10-CM | POA: Diagnosis not present

## 2015-07-14 DIAGNOSIS — E118 Type 2 diabetes mellitus with unspecified complications: Secondary | ICD-10-CM

## 2015-07-14 LAB — CBC
HEMATOCRIT: 36.1 % — AB (ref 39.0–52.0)
Hemoglobin: 12.1 g/dL — ABNORMAL LOW (ref 13.0–17.0)
MCH: 31.1 pg (ref 26.0–34.0)
MCHC: 33.5 g/dL (ref 30.0–36.0)
MCV: 92.8 fL (ref 78.0–100.0)
PLATELETS: 233 10*3/uL (ref 150–400)
RBC: 3.89 MIL/uL — ABNORMAL LOW (ref 4.22–5.81)
RDW: 13.2 % (ref 11.5–15.5)
WBC: 7.2 10*3/uL (ref 4.0–10.5)

## 2015-07-14 LAB — BASIC METABOLIC PANEL
Anion gap: 8 (ref 5–15)
BUN: 19 mg/dL (ref 6–20)
CHLORIDE: 102 mmol/L (ref 101–111)
CO2: 28 mmol/L (ref 22–32)
CREATININE: 1.51 mg/dL — AB (ref 0.61–1.24)
Calcium: 9 mg/dL (ref 8.9–10.3)
GFR calc Af Amer: 57 mL/min — ABNORMAL LOW (ref >60)
GFR calc non Af Amer: 49 mL/min — ABNORMAL LOW (ref >60)
GLUCOSE: 124 mg/dL — AB (ref 65–99)
Potassium: 4.4 mmol/L (ref 3.5–5.1)
SODIUM: 138 mmol/L (ref 135–145)

## 2015-07-14 LAB — GLUCOSE, CAPILLARY: Glucose-Capillary: 120 mg/dL — ABNORMAL HIGH (ref 65–99)

## 2015-07-14 NOTE — Discharge Instructions (Signed)
Please hold Metformin for 48 hours after cath, you can restart on 07/16/2015  No driving for 48 hours. No lifting over 5 lbs for 1 week. No sexual activity for 1 week. You may return to work on 07/18/2015. Keep procedure site clean & dry. If you notice increased pain, swelling, bleeding or pus, call/return!  You may shower, but no soaking baths/hot tubs/pools for 1 week.

## 2015-07-14 NOTE — Progress Notes (Signed)
Patient Name: Matthew Chen Date of Encounter: 07/14/2015  Primary Cardiologist: Dr. Oval Linsey PV Specialist: [name]   Principal Problem:   Claudication [facility]) Active Problems:   HTN (hypertension)   Peripheral arterial disease ([place])   Hyperlipidemia   Type II diabetes mellitus ([place])    SUBJECTIVE  Denies any CP or SOB. Groin not sore either. Feeling great. 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  CURRENT MEDS . amLODipine  10 mg Oral Daily  . aspirin EC  81 mg Oral Daily  . atorvastatin  40 mg Oral Daily  . benazepril  40 mg Oral Daily  . carvedilol  25 mg Oral BID  . clopidogrel  75 mg Oral Q breakfast  . dapagliflozin propanediol  10 mg Oral Daily  . fenofibrate  160 mg Oral Daily  . hydrochlorothiazide  25 mg Oral Daily  . insulin aspart  0-5 Units Subcutaneous QHS  . insulin aspart  0-9 Units Subcutaneous TID WC    OBJECTIVE  Filed Vitals:   07/13/15 1400 07/13/15 1631 07/13/15 2046 07/14/15 0418  BP: 101/55 131/69 117/58 105/63  Pulse: 51 55 55 55  Temp:  97.7 F (36.5 C) 97.7 F (36.5 C) 97.4 F (36.3 C)  TempSrc:  Oral Oral Axillary  Resp: 10 18 18 13   Height:      Weight:    225 lb 8.5 oz (102.3 kg)  SpO2: 96% 96% 96% 96%    Intake/Output Summary (Last 24 hours) at 07/14/15 0709 Last data filed at 07/14/15 0015  Gross per 24 hour  Intake   1060 ml  Output   1825 ml  Net   -765 ml   Filed Weights   07/13/15 0538 07/14/15 0418  Weight: 225 lb (102.059 kg) 225 lb 8.5 oz (102.3 kg)    PHYSICAL EXAM  General: Pleasant, NAD. Neuro: Alert and oriented X 3. Moves all extremities spontaneously. Psych: Normal affect. HEENT:  Normal  Neck: Supple without bruits or JVD. Lungs:  Resp regular and unlabored, CTA. Heart: RRR no s3, s4, or murmurs.  Abdomen: Soft, non-tender, non-distended, BS + x 4. L femoral cath site stable, no obvious bleeding, bruit  or pulsatile mass. Extremities: No clubbing, cyanosis or edema. DP/PT/Radials 2+ and equal bilaterally.  Accessory Clinical Findings  CBC  Recent Labs  07/14/15 0550  WBC 7.2  HGB 12.1*  HCT 36.1*  MCV 92.8  PLT 053   Basic Metabolic Panel  Recent Labs  07/14/15 0550  NA 138  K 4.4  CL 102  CO2 28  GLUCOSE 124*  BUN 19  CREATININE 1.51*  CALCIUM 9.0    TELE NSR without significant ventricular ectopy    ECG  No new EKG  Echocardiogram 05/15/2015  LV EF: 65% -  70%  ------------------------------------------------------------------- Indications:   (R01.1).  ------------------------------------------------------------------- History:  PMH: Acquired from the patient and from the patient&'s chart. Murmur. Risk factors: Hypertension. Diabetes mellitus. Dyslipidemia.  ------------------------------------------------------------------- Study Conclusions  - Left ventricle: The cavity size was normal. There was mild concentric hypertrophy. Systolic function was vigorous. The estimated ejection fraction was in the range of 65% to 70%. Wall motion was normal; there were no regional wall motion abnormalities. Features are consistent with a pseudonormal left ventricular filling pattern, with concomitant abnormal relaxation and increased filling pressure (grade 2 diastolic dysfunction). Doppler parameters are consistent with elevated ventricular  end-diastolic filling pressure. - Aortic valve: Bicuspid; severely thickened, severely calcified leaflets. Valve mobility was restricted. There was mild stenosis. There was trivial regurgitation. Mean gradient (S): 8 mm Hg. Peak gradient (S): 17 mm Hg. - Aortic root: The aortic root was dilated measuring 44 mm. - Ascending aorta: The ascending aorta was mildly dilated measuring 40 mm. - Mitral valve: Calcified annulus. Mildly thickened leaflets . There was mild regurgitation. - Left  atrium: The atrium was mildly dilated. - Right atrium: The atrium was normal in size. - Pulmonary arteries: Systolic pressure couldn&'t be assessed as there was no tricuspid regurgitation. - Inferior vena cava: The vessel was normal in size. - Pericardium, extracardiac: There was no pericardial effusion.  Impressions:  - Bicuspid arotic valve with trivial regurgitation and mild aortic stenosis. Mildly dilated aortic root and ascending aorta.    Radiology/Studies  Dg Chest 2 View  06/28/2015  CLINICAL DATA:  Preoperative exam ; history of peripheral vascular disease, inflammatory bowel disease, hypertension. EXAM: CHEST  2 VIEW COMPARISON:  None in PACs FINDINGS: The lungs are adequately inflated. There is no focal infiltrate. There is no pleural effusion. The heart and pulmonary vascularity are normal. The mediastinum is normal in width. The bony thorax exhibits no acute abnormality. IMPRESSION: There is no active cardiopulmonary disease. Electronically Signed   By: David  Martinique M.D.   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 Arterial doppler study performed in the office on 05/29/2015 revealed high grade R popliteal and distal L SFA dx. LE angiography performed on 06/28/2015 revealing high grade calcific dx with distal L SFA and R popliteal artery s/p diamondback orbital rotational atherectomy, PTA of L SFA. He now present for diamondback orbital rotation atherectomy of his R popliteal artery  1. PAD with claudication sx  -  Arterial doppler study performed in the office on 05/29/2015 revealed high grade R popliteal and distal L SFA dx.   - LE angiography performed on 06/28/2015 revealing high grade calcific dx with distal L SFA and R popliteal artery s/p diamondback orbital rotational atherectomy, PTA of L SFA.  - staged LE angiography via contralateral  access, diamondback orbital rotational atherectomy and drug eluting balloon angioplasty of R popliteal artery (P2 segment) using distal protection  - plan for discharge today, LE arterial doppler study next week and followup with [name] or his PA/NP in 2-3 weeks.  2. Stage III CKD: stable post angiography 3. HTN: on HCTZ, coreg, benazepril, coreg. 4. HLD: continue lipitor and finofibrate 5. DMII: hold metformin for 48 hrs after cath 6. Former smoker  Visual merchandiser, Almyra Deforest [name] Pager: [phone]  I have seen, examined and evaluated the patient this AM along with Mr. Eulah Citizen.  After reviewing all the available data and chart,  I agree with his findings, examination as well as impression recommendations.  Looks good post Atherectomy/PTCA of R Pop A - cath site stable. BP controlled with current meds On statin & fibrate Glycemic control OK given that metformin being held - restart in 48 hr  OK for d/c with routine post PAD Intervention f/u protocol - LEA Dopplers & APP f/u   Leonie Man, M.D., M.S. Interventional Cardiologist   Pager # [phone]

## 2015-07-14 NOTE — Discharge Summary (Signed)
Discharge Summary   Patient ID: Matthew Chen,  MRN: [id number], DOB/AGE: 02/29/1956 59 y.o.  Admit date: 07/13/2015 Discharge date: 07/14/2015  Primary Care Provider: [facility] Primary Cardiologist: Dr. Oval Linsey PV Specialist: [name]  Discharge Diagnoses Principal Problem:   Claudication [facility]) Active Problems:   HTN (hypertension)   Peripheral arterial disease ([employer])   Hyperlipidemia   Type II diabetes mellitus ([place])   CKD (chronic kidney disease), stage III   Allergies Allergies  Allergen Reactions  . Penicillins     unsure    Procedures  LE angiography 07/13/2015  Pre Procedure Diagnosis: peripheral arterial disease  Post Procedure Diagnosis: peripheral arterial disease  Operators: Dr. Quay Burow   Procedures Performed: 1. Contralateral access 2. Placement of a distal protection device in the below-the-knee popliteal artery NAV 6  3. Diamondback orbital rotational atherectomy using a 1.5 mm upgraded to a 2.0 mm bur right popliteal artery 4. Drug eluting balloon angioplasty using a lutonix 5 mm x 4 cm balloon right popliteal artery  Final Impression: successful diamondback orbital rotational atherectomy, PTA using drug-eluting balloon of a high-grade calcified exophytic subocclusive right popliteal (P2 segment) stenosis for lifestyle limiting claudication. The patient is already on dual antiplatelet therapy. The sheath will be removed once ACT is documented to be less than 170 impression will be held. He'll will remain recumbent for 6 hours and will be hydrated overnight. He'll be discharged on the morning and we will obtain lower extremity arterial Doppler studies in our [facility] office next week. I will see him back 2-3 weeks there are after.    Hospital Course  [name] is a 59 yo Caucasian male with PMH of HTN, PAD, HLD, DMII, CKD stage III and former tobacco abuse. He has been having lifestyle  limiting claudication symptoms. He had arterial doppler performed in the office on 05/29/2015 revealed high-grade right popliteal and distal left SFA disease. He underwent lower extremity angiography on 06/28/2015 revealing high grade calcific dx with distal L SFA and R popliteal artery s/p diamondback orbital rotational atherectomy, PTA of L SFA. He now presents for diamondback orbital rotational atherectomy of his right popliteal artery.  He underwent the planned procedure on the 07/13/2015 which showed High-grade P2 segment right popliteal stenosis treated with staged LE angiography via contralateral access, diamondback orbital rotational atherectomy and drug eluting balloon angioplasty of R popliteal artery (P2 segment) using distal protection. Postprocedure, he had transient drop in the blood pressure down into the high 80s however quickly improved. Overnight, he did well without significant discomfort. He was seen on the following morning on 07/14/2015, at which time he denies any significant chest site complication. Left femoral cath site appears to be stable without significant bleeding or hematoma. There was no bruit on exam and no pulsatile mass. He was deemed stable for discharge from cardiology perspective. He is currently on DAPT. His blood pressure is well treated on HCTZ, carvedilol, benazepril and amlodipine. He has been instructed to hold metformin for 48 hours post cath. 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 Given his intravascular procedure, this is reasonable, I have instructed him not to lift anything > 5 pounds which patient states he does not do that during work anyway. I will arrange one week bilateral lower extremity arterial Doppler and 2-3 weeks follow-up with Dr. Kennon Holter office.   Discharge Vitals Blood pressure 137/68, pulse 58, temperature 97.6 F (36.4 C), temperature source Oral, resp. rate 14, height 5'  10" (1.778 m), weight 225 lb 8.5  oz (102.3 kg), SpO2 100 %.  Filed Weights   07/13/15 0538 07/14/15 0418  Weight: 225 lb (102.059 kg) 225 lb 8.5 oz (102.3 kg)    Labs  CBC  Recent Labs  07/14/15 0550  WBC 7.2  HGB 12.1*  HCT 36.1*  MCV 92.8  PLT 157   Basic Metabolic Panel  Recent Labs  07/14/15 0550  NA 138  K 4.4  CL 102  CO2 28  GLUCOSE 124*  BUN 19  CREATININE 1.51*  CALCIUM 9.0    Disposition  Pt is being discharged home today in good condition.  Follow-up Plans & Appointments      Follow-up Information    Follow up with [facility] On 07/21/2015.   Specialty:  Cardiology   Why:  1:30pm.  bilateral lower extremity arterial doppler. No need for fasting.    Contact information:   [address] [facility] [place] [id number]      Follow up with Quay Burow, MD On 07/28/2015.   Specialties:  Cardiology, Radiology   Why:  9:15am. Cardiology followup.    Contact information:   [address] [place] [address] [phone]       Follow up with Sharol Harness, MD On 08/21/2015.   Specialty:  Cardiology   Why:  8:15am.    Contact information:   [address] [place] [place] [address] [phone]       Discharge Medications    Medication List    TAKE these medications        ALPRAZolam 1 MG tablet  Commonly known as:  XANAX  Take 1 tablet (1 mg total) by mouth at bedtime as needed for anxiety or sleep.     amLODipine 10 MG tablet  Commonly known as:  NORVASC  Take 1 tablet (10 mg total) by mouth daily.     aspirin EC 81 MG tablet  Take 1 tablet (81 mg total) by mouth daily.     atorvastatin 40 MG tablet  Commonly known as:  LIPITOR  Take 1 tablet (40 mg total) by mouth daily.     balsalazide 750 MG capsule  Commonly known as:  COLAZAL  TAKE 3 CAPSULES BY MOUTH 3 TIMES A DAY     benazepril 40 MG tablet  Commonly known as:  LOTENSIN  TAKE 1 TABLET BY MOUTH EVERY DAY     carvedilol 25  MG tablet  Commonly known as:  COREG  Take 1 tablet (25 mg total) by mouth 2 (two) times daily.     clopidogrel 75 MG tablet  Commonly known as:  PLAVIX  Take 1 tablet (75 mg total) by mouth daily with breakfast.     dapagliflozin propanediol 10 MG Tabs tablet  Commonly known as:  FARXIGA  Take 10 mg by mouth daily.     fenofibrate 160 MG tablet  TAKE 1 TABLET BY MOUTH EVERY DAY     hydrochlorothiazide 25 MG tablet  Commonly known as:  HYDRODIURIL  Take 1 tablet (25 mg total) by mouth daily.     metFORMIN 1000 MG tablet  Commonly known as:  GLUCOPHAGE  TAKE 1 TABLET BY MOUTH TWICE A DAY WITH A MEAL        Outstanding Labs/Studies  Outpatient lower extremity arterial doppler  Duration of Discharge Encounter   Greater than 30 minutes including physician time.  Hilbert Corrigan [name] Pager: [phone] 07/14/2015,  8:41 AM    I have seen, examined and evaluated the patient this AM along with Mr. Eulah Citizen. After reviewing all the available data and chart, I agree SUMMARY of Hospital Events,  Recommendations & f/u.  Looks good post Atherectomy/PTCA of R Pop A - cath site stable. BP controlled with current meds On statin & fibrate Glycemic control OK given that metformin being held - restart in 48 hr  OK for d/c with routine post PAD Intervention f/u protocol - LEA Dopplers & APP f/u   Leonie Man, M.D., M.S. Interventional Cardiologist   Pager # [phone]

## 2015-07-21 ENCOUNTER — Encounter (HOSPITAL_COMMUNITY): Payer: BLUE CROSS/BLUE SHIELD

## 2015-07-24 ENCOUNTER — Other Ambulatory Visit: Payer: Self-pay | Admitting: Cardiovascular Disease

## 2015-07-24 DIAGNOSIS — I739 Peripheral vascular disease, unspecified: Secondary | ICD-10-CM

## 2015-07-28 ENCOUNTER — Ambulatory Visit: Payer: BLUE CROSS/BLUE SHIELD | Admitting: Cardiovascular Disease

## 2015-07-31 ENCOUNTER — Encounter: Payer: Self-pay | Admitting: Cardiovascular Disease

## 2015-07-31 ENCOUNTER — Ambulatory Visit (HOSPITAL_COMMUNITY)
Admission: RE | Admit: 2015-07-31 | Discharge: 2015-07-31 | Disposition: A | Discharge status: Home / Self Care | Payer: BLUE CROSS/BLUE SHIELD | Source: Ambulatory Visit | Attending: Physician Assistant | Admitting: Physician Assistant

## 2015-07-31 ENCOUNTER — Ambulatory Visit (INDEPENDENT_AMBULATORY_CARE_PROVIDER_SITE_OTHER): Payer: BLUE CROSS/BLUE SHIELD | Admitting: Cardiovascular Disease

## 2015-07-31 VITALS — BP 110/74 | HR 58 | Ht 70.0 in | Wt 230.0 lb

## 2015-07-31 DIAGNOSIS — E785 Hyperlipidemia, unspecified: Secondary | ICD-10-CM | POA: Insufficient documentation

## 2015-07-31 DIAGNOSIS — I129 Hypertensive chronic kidney disease with stage 1 through stage 4 chronic kidney disease, or unspecified chronic kidney disease: Secondary | ICD-10-CM | POA: Insufficient documentation

## 2015-07-31 DIAGNOSIS — I739 Peripheral vascular disease, unspecified: Secondary | ICD-10-CM | POA: Diagnosis not present

## 2015-07-31 DIAGNOSIS — N183 Chronic kidney disease, stage 3 (moderate): Secondary | ICD-10-CM | POA: Diagnosis not present

## 2015-07-31 NOTE — Patient Instructions (Addendum)
Medication Instructions:  Your physician recommends that you continue on your current medications as directed. Please refer to the Current Medication list given to you today.  Labwork: NONE  Testing/Procedures: Your physician has requested that you have a lower extremity arterial duplex in 6 months. This test is an ultrasound of the arteries in the legs or arms. It looks at arterial blood flow in the legs . Allow one hour for Lower Arterial scans. There are no restrictions or special instructions  Follow-Up: We request that you follow-up in:  6 months in  with Dr Gwenlyn Found after lower extremity arterial duplex.  You will receive a reminder letter in the mail two months in advance. If you don't receive a letter, please call our office to schedule the follow-up appointment.   If you need a refill on your cardiac medications before your next appointment, please call your pharmacy.

## 2015-07-31 NOTE — Progress Notes (Signed)
07/31/2015 Matthew Chen   02-23-1956  [id number]  Primary Physician Odette Fraction, MD Primary Cardiologist: Lorretta Harp MD Renae Gloss   HPI:  Matthew Chen is a delightful 59 year old mild to moderately overweight married Caucasian male father of 4 children, grandfather of a grandchildren was referred by Dr. Skeet Latch for evaluation and treatment of claudication. I last saw him in the office 07/04/15.He has a history of treated hypertension, diabetes and hyperlipidemia. 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 He's never had a heart attack or stroke and denies chest pain or shortness of breath. He does complain of lifestyle limiting claudication and had arterial Doppler studies performed in our office 05/29/15 revealing high-grade right popliteal and distal left SFA disease. Based on this, we decided to proceed with angiography and potential percutaneous revascularization. I initially angiogrammed him on 06/29/15 and performed diamondback orbital rotational atherectomy, drug eluting balloon angioplasty of the distal left SFA for a high-grade, subocclusive calcific/exophytic plaque. He had a high-grade calcific/exophytic subocclusive right popliteal artery plaque for which he underwent diamondback orbital rotation arthrectomy, drug eluding balloon angioplasty on 07/13/15 with an excellent angiographic result. Since that time his claudication has resolved. His Dopplers performed 07/31/15 revealed normal ABIs bilaterally with normal frequencies.  Current Outpatient Prescriptions  Medication Sig Dispense Refill  . ALPRAZolam (XANAX) 1 MG tablet Take 1 tablet (1 mg total) by mouth at bedtime as needed for anxiety or sleep. 20 tablet 0  . amLODipine (NORVASC) 10 MG tablet Take 10 mg by mouth daily.  11  . aspirin EC 81 MG tablet Take 1 tablet (81 mg total) by mouth daily. 90 tablet 3  . atorvastatin (LIPITOR) 40 MG tablet Take 1 tablet  (40 mg total) by mouth daily. 30 tablet 11  . balsalazide (COLAZAL) 750 MG capsule TAKE 3 CAPSULES BY MOUTH 3 TIMES A DAY 270 capsule 9  . benazepril (LOTENSIN) 40 MG tablet TAKE 1 TABLET BY MOUTH EVERY DAY 30 tablet 5  . carvedilol (COREG) 25 MG tablet Take 1 tablet (25 mg total) by mouth 2 (two) times daily. 60 tablet 11  . clopidogrel (PLAVIX) 75 MG tablet Take 1 tablet (75 mg total) by mouth daily with breakfast. 30 tablet 11  . dapagliflozin propanediol (FARXIGA) 10 MG TABS tablet Take 10 mg by mouth daily. 30 tablet 11  . fenofibrate 160 MG tablet TAKE 1 TABLET BY MOUTH EVERY DAY 30 tablet 5  . hydrochlorothiazide (HYDRODIURIL) 25 MG tablet Take 1 tablet (25 mg total) by mouth daily. 90 tablet 3  . metFORMIN (GLUCOPHAGE) 1000 MG tablet TAKE 1 TABLET BY MOUTH TWICE A DAY WITH A MEAL 60 tablet 5   No current facility-administered medications for this visit.    Allergies  Allergen Reactions  . Penicillins     unsure    Social History   Social History  . Marital Status: Married    Spouse Name: N/A  . Number of Children: 2  . Years of Education: N/A   Occupational History  . Truck driver   .     Social History Main Topics  . Smoking status: Former Smoker -- 1.50 packs/day for 25 years    Types: Cigarettes    Quit date: 08/12/1996  . Smokeless tobacco: Never Used  . Alcohol Use: 0.6 oz/week    1 Cans of beer per week  . Drug Use: No  . Sexual Activity: Yes   Other  Topics Concern  . Not on file   Social History Narrative     Review of Systems: General: negative for chills, fever, night sweats or weight changes.  Cardiovascular: negative for chest pain, dyspnea on exertion, edema, orthopnea, palpitations, paroxysmal nocturnal dyspnea or shortness of breath Dermatological: negative for rash Respiratory: negative for cough or wheezing Urologic: negative for hematuria Abdominal: negative for nausea, vomiting, diarrhea, bright red blood per rectum, melena, or  hematemesis Neurologic: negative for visual changes, syncope, or dizziness All other systems reviewed and are otherwise negative except as noted above.    Blood pressure 110/74, pulse 58, height 5' 10"  (1.778 m), weight 230 lb (104.327 kg).  General appearance: alert and no distress Neck: no adenopathy, no carotid bruit, no JVD, supple, symmetrical, trachea midline and thyroid not enlarged, symmetric, no tenderness/mass/nodules Lungs: clear to auscultation bilaterally Heart: regular rate and rhythm, S1, S2 normal, no murmur, click, rub or gallop Extremities: extremities normal, atraumatic, no cyanosis or edema  EKG not performed today  ASSESSMENT AND PLAN:   Peripheral arterial disease (HCC) Status post staged bilateral SFA/popliteal diamondback orbital rotational atherectomy, drug-eluting balloon angioplasty of the distal left SFA and proximal right popliteal arteries most recently on 07/13/15 for lifestyle limiting claudication. The procedures were technically successful. His claudication has resolved. Dopplers performed today revealed ABIs greater than 1 bilaterally with normal Doppler frequencies. He is on dual antibiotic therapy. I will repeat his lower extremity arterial Doppler studies in 6 months and we'll see him back after that and follow-up.      Lorretta Harp MD FACP,FACC,FAHA, [facility] 07/31/2015 1:08 PM

## 2015-07-31 NOTE — Assessment & Plan Note (Signed)
Status post staged bilateral SFA/popliteal diamondback orbital rotational atherectomy, drug-eluting balloon angioplasty of the distal left SFA and proximal right popliteal arteries most recently on 07/13/15 for lifestyle limiting claudication. The procedures were technically successful. His claudication has resolved. Dopplers performed today revealed ABIs greater than 1 bilaterally with normal Doppler frequencies. He is on dual antibiotic therapy. I will repeat his lower extremity arterial Doppler studies in 6 months and we'll see him back after that and follow-up.

## 2015-08-21 ENCOUNTER — Ambulatory Visit: Payer: BLUE CROSS/BLUE SHIELD | Admitting: Cardiovascular Disease

## 2015-09-03 NOTE — Progress Notes (Signed)
Cardiology Office Note   Date:  09/04/2015   ID:  Matthew Chen, DOB 18-Oct-1955, MRN [id number]  PCP:  Odette Fraction, MD  Cardiologist:   Matthew Harness, MD   Chief Complaint  Patient presents with  . Follow-up    3 month, no chest pain , no sob, no swelling-     Patient ID: Matthew Chen is a 60 y.o. male with diabetes mellitus, hypertension, hyperlipidemia and anxiety who presents for an evaluation of hypertension.    Interval History 09/04/15: After Matthew Chen's last appointment was referred for a sleep study.  Atenolol was switched to carvedilol for improved blood pressure control.  A murmur was noted on exam so he had an echo that showed LVEF 65-70% and a bicuspid aortic valve with mild aortic stenosis and a mildly dilated ascending aorta.  He was also referred to Dr. Gwenlyn Chen after Doppler arterial studies revealed high-grade right popliteal and distal left SFA disease.  He underwent angiography on 11/16 that revealed high-grade distal left SFA and right popliteal artery disease.  He underwent PTA of the left SFA in November and had a staged intervention of the right popliteal artery in December.  Since the intervention Matthew Chen denies claudication.  Matthew Chen s feeling well. He denies any chest pain, shortness of breath, palpitations, lightheadedness or dizziness. He has not noted any lower extremity edema, orthopnea or PND.   History of Present Illness 05/05/15: Matthew Chen reports in over 25 year history of hypertension. Previously it was relatively easy to manage. However in the last year is has become increasingly difficult. 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  He has been evaluated by his PCP, Dr. Jenna Chen and started on clonidine 0.34m bid and nebivolol 129mdaily.  His office blood pressures remained elevated on this regimen.  Dr. PiDennard Schaumannas concerned that his BP was elevated due to the anxiety of passing  this physical, so he was started on Xanax at night.  His regimen was switched to benazepril 4035maily, atenolol 60m97mily and clonididne 0.1mg 19mbid and his home BP ranged from 160-170/80-90.  Hydrochlorothiazide was added to his regimen and he was referred to cardiology for evaluation of secondary causes of hypertension.  He has been taking all medications as prescribed.  Matthew Chen any chest pain. He does endorse leg cramping when walking for the last year. He gets better with rest and then recurs as he starts walking again. 4-5 years Chen he was able to walk over an hour to time. Now he can't walk more than 10 minutes. He also endorses some mild shortness of breath with walking. He denies any lower extremity edema, orthopnea, or PND. He does snore some, but does not think that he awakens throughout the night due to lack of oxygen. His wife is with him and concurs.  Matthew Chen not smoke. He quit 17 years Chen. He occasionally drinks alcohol and does not use any illicit drugs. He does not frequently use NSAIDs or over-the-counter decongestants.    Past Medical History  Diagnosis Date  . Ulcerative colitis   . Hemorrhoids   . Hyperlipidemia   . Hypertension   . Adenomatous colon polyp 03/2008  . Peripheral arterial disease (HCC) [name] dopppler 05/29/2015 revealed high-grade right popliteal and distal left SFA disease b. LE angio 06/28/2015 revealing high grade calcific dx with distal L  SFA and R popliteal artery s/p diamondback orbital rotational atherectomy, PTA of L SFA  c. 07/13/2015 diamondback orbital rotational atherectomy and drug eluting balloon angioplasty of R popliteal artery (P2 segment) using distal protection   . Type II diabetes mellitus ([place])   . Arthritis     "joints in my fingers ache" (07/13/2015)  . CKD (chronic kidney disease), stage III     Past Surgical History  Procedure Laterality Date  . Cosmetic surgery  < 2000    "removed birthmark from top of my head"  .  Colonoscopy w/ polypectomy  X 4-5  . Peripheral vascular catheterization N/A 06/29/2015    Procedure: Lower Extremity Angiography;  Surgeon: Matthew Harp, MD;  Location: [place] [facility];  Service: Cardiovascular;  Laterality: N/A;  . Tonsillectomy    . Peripheral vascular catheterization N/A 07/13/2015    Procedure: Lower Extremity Angiography;  Surgeon: Matthew Harp, MD;  Location: [place] [facility];  Service: Cardiovascular;  Laterality: N/A;  . Peripheral vascular catheterization  07/13/2015    Procedure: Peripheral Vascular Atherectomy;  Surgeon: Matthew Harp, MD;  Location: [place] [facility];  Service: Cardiovascular;;     Current Outpatient Prescriptions  Medication Sig Dispense Refill  . ALPRAZolam (XANAX) 1 MG tablet Take 1 tablet (1 mg total) by mouth at bedtime as needed for anxiety or sleep. 20 tablet 0  . amLODipine (NORVASC) 10 MG tablet Take 10 mg by mouth daily.  11  . aspirin EC 81 MG tablet Take 1 tablet (81 mg total) by mouth daily. 90 tablet 3  . atorvastatin (LIPITOR) 40 MG tablet Take 1 tablet (40 mg total) by mouth daily. 30 tablet 11  . balsalazide (COLAZAL) 750 MG capsule TAKE 3 CAPSULES BY MOUTH 3 TIMES A DAY 270 capsule 9  . carvedilol (COREG) 25 MG tablet Take 1 tablet (25 mg total) by mouth 2 (two) times daily. 60 tablet 11  . clopidogrel (PLAVIX) 75 MG tablet Take 1 tablet (75 mg total) by mouth daily with breakfast. 30 tablet 11  . dapagliflozin propanediol (FARXIGA) 10 MG TABS tablet Take 10 mg by mouth daily. 30 tablet 11  . fenofibrate 160 MG tablet TAKE 1 TABLET BY MOUTH EVERY DAY 30 tablet 5  . hydrochlorothiazide (HYDRODIURIL) 25 MG tablet Take 1 tablet (25 mg total) by mouth daily. 90 tablet 3  . metFORMIN (GLUCOPHAGE) 1000 MG tablet TAKE 1 TABLET BY MOUTH TWICE A DAY WITH A MEAL 60 tablet 5  . lisinopril-hydrochlorothiazide (PRINZIDE,ZESTORETIC) 20-25 MG tablet Take 1 tablet by mouth daily. 30 tablet 11   No current facility-administered  medications for this visit.    Allergies:   Penicillins    Social History:  The patient  reports that he quit smoking about 19 years Chen. His smoking use included Cigarettes. He has a 37.5 pack-year smoking history. He has never used smokeless tobacco. He reports that he drinks about 0.6 oz of alcohol per week. He reports that he does not use illicit drugs.   Family History:  The patient's family history includes Colon cancer in his maternal grandmother; Colon polyps in his mother; Diabetes in his maternal aunt and mother; Heart disease in his father. There is no history of Esophageal cancer, Stomach cancer, or Rectal cancer.    ROS:  Please see the history of present illness.   Otherwise, review of systems are positive for none.   All other systems are reviewed and negative.    PHYSICAL EXAM: VS:  BP 120/80 mmHg  Pulse 60  Ht 5' 10"  (1.778 m)  Wt 101.742 kg (224 lb 4.8 oz)  BMI 32.18 kg/m2 , BMI Body mass index is 32.18 kg/(m^2). GENERAL:  Well appearing HEENT:  Pupils equal round and reactive, fundi not visualized, oral mucosa unremarkable NECK:  No jugular venous distention, waveform within normal limits, carotid upstroke brisk and symmetric, no bruits, no thyromegaly LYMPHATICS:  No cervical adenopathy LUNGS:  Clear to auscultation bilaterally HEART:  RRR.  PMI not displaced or sustained,S1 and S2 within normal limits, no S3, no S4, no clicks, no rubs, II/VI early-peaking, crescendo-decrescendo murmur at RUSB ABD:  Flat, positive bowel sounds normal in frequency in pitch, no bruits, no rebound, no guarding, no midline pulsatile mass, no hepatomegaly, no splenomegaly EXT:  2 plus radial pulse, DP/PT bilaterally, no edema, no cyanosis no clubbing SKIN:  No rashes no nodules NEURO:  Cranial nerves II through XII grossly intact, motor grossly intact throughout PSYCH:  Cognitively intact, oriented to person place and time   EKG:  EKG is not ordered today.  Recent Labs: 05/05/2015:  TSH 1.131 06/12/2015: ALT 39 07/14/2015: BUN 19; Creatinine, Ser 1.51*; Hemoglobin 12.1*; Platelets 233; Potassium 4.4; Sodium 138    Lipid Panel    Component Value Date/Time   CHOL 107* 06/12/2015 0941   TRIG 227* 06/12/2015 0941   HDL 25* 06/12/2015 0941   CHOLHDL 4.3 06/12/2015 0941   VLDL 45* 06/12/2015 0941   LDLCALC 37 06/12/2015 0941      Wt Readings from Last 3 Encounters:  09/04/15 101.742 kg (224 lb 4.8 oz)  07/31/15 104.327 kg (230 lb)  07/14/15 102.3 kg (225 lb 8.5 oz)      Other studies Reviewed: Additional studies/ records that were reviewed today include:   Review of the above records demonstrates:  Please see elsewhere in the note.     ASSESSMENT AND PLAN:  # Hypertension:  Blood pressure is well-controlled today.  Mr. Korinek would iven that he has difficult toanage hypertenon in the past, I do not think that stopping his antihypertensives is a good idea.  We can switch benazepril to a combination pill with lisinopril 20 mg/hydrochlorothiazide 25 mg.  He will continue amlodipine and carvedilol.  He will check his blood pressure at home and if it is over 140/90 will give [place] a call for additional modifications.  # Mild aortic stenosis, bicuspid valve:  Echo showed mild aortic stenosis with a bicuspid valve. He does not have any symptoms at this time. We will repeat his echo in 2 years.   # PVD: Mr. Lotito denies anyclaudication after intervention by Dr. Gwenlyn Chen.  Continue aspirin and plavix.  # Hyperlipidemia: Continue atorvastatin and fenofibrate.  Current medicines are reviewed at length with the patient today.  The patient does not have concerns regarding medicines.  The following changes have been made:  Switch enalapril and HCTZ to combination HCTZ/lisinopril.  Labs/ tests ordered today include:  No orders of the defined types were placed in this encounter.     Disposition:   FU with Kelsye Loomer C. Oval Linsey, MD in 1 year.   Signed, Matthew Harness, MD   09/04/2015 9:11 AM    [place]

## 2015-09-04 ENCOUNTER — Encounter: Payer: Self-pay | Admitting: Cardiovascular Disease

## 2015-09-04 ENCOUNTER — Ambulatory Visit (INDEPENDENT_AMBULATORY_CARE_PROVIDER_SITE_OTHER): Payer: BLUE CROSS/BLUE SHIELD | Admitting: Cardiovascular Disease

## 2015-09-04 ENCOUNTER — Other Ambulatory Visit: Payer: Self-pay | Admitting: *Deleted

## 2015-09-04 VITALS — BP 120/80 | HR 60 | Ht 70.0 in | Wt 224.3 lb

## 2015-09-04 DIAGNOSIS — I350 Nonrheumatic aortic (valve) stenosis: Secondary | ICD-10-CM | POA: Diagnosis not present

## 2015-09-04 DIAGNOSIS — Q231 Congenital insufficiency of aortic valve: Secondary | ICD-10-CM

## 2015-09-04 DIAGNOSIS — I10 Essential (primary) hypertension: Secondary | ICD-10-CM | POA: Diagnosis not present

## 2015-09-04 DIAGNOSIS — I739 Peripheral vascular disease, unspecified: Secondary | ICD-10-CM

## 2015-09-04 DIAGNOSIS — E785 Hyperlipidemia, unspecified: Secondary | ICD-10-CM

## 2015-09-04 MED ORDER — LISINOPRIL-HYDROCHLOROTHIAZIDE 20-25 MG PO TABS: 1.0000 | ORAL_TABLET | Freq: Every day | ORAL | Status: DC

## 2015-09-04 NOTE — Patient Instructions (Addendum)
STOP BENAZAPRIL  AND HCTZ  START LISINOPRIL -HCTZ 20/25 MG ONE TABLET  ONCE A DAY  NO OTHER CHANGES .  Your physician wants you to follow-up in [place].  You will receive a reminder letter in the mail two months in advance. If you don't receive a letter, please call our office to schedule the follow-up appointment.  If you need a refill on your cardiac medications before your next appointment, please call your pharmacy.

## 2015-09-04 NOTE — Telephone Encounter (Signed)
DISCONTINUE MEDICATION AT OFFICE VISIT TODAY 09/04/15. STARTED LISINOPRIL-HCTZ  20 /25 MG

## 2015-09-21 ENCOUNTER — Other Ambulatory Visit: Payer: Self-pay | Admitting: *Deleted

## 2015-09-21 ENCOUNTER — Other Ambulatory Visit: Payer: Self-pay | Admitting: Family Medicine

## 2015-09-21 MED ORDER — METFORMIN HCL 1000 MG PO TABS: ORAL_TABLET | ORAL | Status: DC

## 2015-09-21 NOTE — Telephone Encounter (Signed)
Received fax requesting refill on MTF.   Refill appropriate and filled per protocol.

## 2015-09-21 NOTE — Telephone Encounter (Signed)
Refill appropriate and filled per protocol. 

## 2015-11-13 ENCOUNTER — Encounter: Payer: Self-pay | Admitting: Family Medicine

## 2015-11-13 ENCOUNTER — Ambulatory Visit (INDEPENDENT_AMBULATORY_CARE_PROVIDER_SITE_OTHER): Payer: BLUE CROSS/BLUE SHIELD | Admitting: Family Medicine

## 2015-11-13 VITALS — BP 110/74 | HR 64 | Temp 97.9°F | Resp 18 | Ht 70.0 in | Wt 218.0 lb

## 2015-11-13 DIAGNOSIS — E119 Type 2 diabetes mellitus without complications: Secondary | ICD-10-CM

## 2015-11-13 DIAGNOSIS — Z125 Encounter for screening for malignant neoplasm of prostate: Secondary | ICD-10-CM | POA: Diagnosis not present

## 2015-11-13 DIAGNOSIS — E785 Hyperlipidemia, unspecified: Secondary | ICD-10-CM | POA: Diagnosis not present

## 2015-11-13 DIAGNOSIS — I10 Essential (primary) hypertension: Secondary | ICD-10-CM

## 2015-11-13 LAB — COMPLETE METABOLIC PANEL WITH GFR
ALT: 24 U/L (ref 9–46)
AST: 23 U/L (ref 10–35)
Albumin: 4.3 g/dL (ref 3.6–5.1)
Alkaline Phosphatase: 57 U/L (ref 40–115)
BUN: 23 mg/dL (ref 7–25)
CALCIUM: 9.6 mg/dL (ref 8.6–10.3)
CHLORIDE: 105 mmol/L (ref 98–110)
CO2: 26 mmol/L (ref 20–31)
Creat: 1.42 mg/dL — ABNORMAL HIGH (ref 0.70–1.25)
GFR, Est African American: 62 mL/min (ref >=60)
GFR, Est Non African American: 53 mL/min — ABNORMAL LOW (ref >=60)
Glucose, Bld: 130 mg/dL — ABNORMAL HIGH (ref 70–99)
POTASSIUM: 4.4 mmol/L (ref 3.5–5.3)
Sodium: 141 mmol/L (ref 135–146)
Total Bilirubin: 0.5 mg/dL (ref 0.2–1.2)
Total Protein: 6.8 g/dL (ref 6.1–8.1)

## 2015-11-13 LAB — LIPID PANEL
CHOL/HDL RATIO: 4.3 ratio (ref <=5.0)
Cholesterol: 115 mg/dL — ABNORMAL LOW (ref 125–200)
HDL: 27 mg/dL — AB (ref >=40)
LDL Cholesterol: 35 mg/dL (ref <130)
TRIGLYCERIDES: 264 mg/dL — AB (ref <150)
VLDL: 53 mg/dL — ABNORMAL HIGH (ref <30)

## 2015-11-13 LAB — CBC WITH DIFFERENTIAL/PLATELET
Basophils Absolute: 48 cells/uL (ref 0–200)
Basophils Relative: 1 %
EOS PCT: 3 %
Eosinophils Absolute: 144 cells/uL (ref 15–500)
HEMATOCRIT: 40.9 % (ref 38.5–50.0)
Hemoglobin: 13.7 g/dL (ref 13.0–17.0)
LYMPHS PCT: 33 %
Lymphs Abs: 1584 cells/uL (ref 850–3900)
MCH: 29.1 pg (ref 27.0–33.0)
MCHC: 33.5 g/dL (ref 32.0–36.0)
MCV: 87 fL (ref 80.0–100.0)
MONOS PCT: 12 %
MPV: 10.9 fL (ref 7.5–12.5)
Monocytes Absolute: 576 cells/uL (ref 200–950)
NEUTROS PCT: 51 %
Neutro Abs: 2448 cells/uL (ref 1500–7800)
Platelets: 227 10*3/uL (ref 140–400)
RBC: 4.7 MIL/uL (ref 4.20–5.80)
RDW: 14.7 % (ref 11.0–15.0)
WBC: 4.8 10*3/uL (ref 3.8–10.8)

## 2015-11-13 LAB — HEMOGLOBIN A1C
HEMOGLOBIN A1C: 7.8 % — AB (ref <5.7)
MEAN PLASMA GLUCOSE: 177 mg/dL

## 2015-11-13 NOTE — Progress Notes (Signed)
Subjective:    Patient ID: Matthew Chen, male    DOB: 08-20-1955, 60 y.o.   MRN: [id number]  HPI Since I last saw the patient, he was diagnosed with peripheral vascular disease with 95% occlusions in both legs. He subsequently underwent catheterization and balloon angioplasty under the care of [name]. The claudication in his legs have completely stopped. He is currently on aspirin and Plavix. Stress test of his heart returned normal. His blood pressure has been well controlled over the last several months. He denies any chest pain shortness of breath or dyspnea on exertion. His eye exam was performed in November and was normal. He declines hepatitis C screening. His blood sugars are ranging between 80 and 150. He denies any hypoglycemic episodes. He denies any polyuria, polydipsia, or blurred vision. He is due for Pneumovax 23. Colonoscopy is up-to-date. He is due for a PSA as well as a urine microalbumin and hemoglobin A1c. Past Medical History  Diagnosis Date  . Ulcerative colitis   . Hemorrhoids   . Hyperlipidemia   . Hypertension   . Adenomatous colon polyp 03/2008  . Peripheral arterial disease ([place])     a. dopppler 05/29/2015 revealed high-grade right popliteal and distal left SFA disease b. LE angio 06/28/2015 revealing high grade calcific dx with distal L SFA and R popliteal artery s/p diamondback orbital rotational atherectomy, PTA of L SFA  c. 07/13/2015 diamondback orbital rotational atherectomy and drug eluting balloon angioplasty of R popliteal artery (P2 segment) using distal protection   . Type II diabetes mellitus ([place])   . Arthritis     "joints in my fingers ache" (07/13/2015)  . CKD (chronic kidney disease), stage III    Past Surgical History  Procedure Laterality Date  . Cosmetic surgery  < 2000    "removed birthmark from top of my head"  . Colonoscopy w/ polypectomy  X 4-5  . Peripheral vascular catheterization N/A 06/29/2015    Procedure: Lower Extremity Angiography;   Surgeon: Lorretta Harp, MD;  Location: [place] [facility];  Service: Cardiovascular;  Laterality: N/A;  . Tonsillectomy    . Peripheral vascular catheterization N/A 07/13/2015    Procedure: Lower Extremity Angiography;  Surgeon: Lorretta Harp, MD;  Location: [place] [facility];  Service: Cardiovascular;  Laterality: N/A;  . Peripheral vascular catheterization  07/13/2015    Procedure: Peripheral Vascular Atherectomy;  Surgeon: Lorretta Harp, MD;  Location: [place] [facility];  Service: Cardiovascular;;   Current Outpatient Prescriptions on File Prior to Visit  Medication Sig Dispense Refill  . ALPRAZolam (XANAX) 1 MG tablet Take 1 tablet (1 mg total) by mouth at bedtime as needed for anxiety or sleep. 20 tablet 0  . amLODipine (NORVASC) 10 MG tablet Take 10 mg by mouth daily.  11  . aspirin EC 81 MG tablet Take 1 tablet (81 mg total) by mouth daily. 90 tablet 3  . atorvastatin (LIPITOR) 40 MG tablet Take 1 tablet (40 mg total) by mouth daily. 30 tablet 11  . balsalazide (COLAZAL) 750 MG capsule TAKE 3 CAPSULES BY MOUTH 3 TIMES A DAY 270 capsule 9  . carvedilol (COREG) 25 MG tablet Take 1 tablet (25 mg total) by mouth 2 (two) times daily. 60 tablet 11  . clopidogrel (PLAVIX) 75 MG tablet Take 1 tablet (75 mg total) by mouth daily with breakfast. 30 tablet 11  . dapagliflozin propanediol (FARXIGA) 10 MG TABS tablet Take 10 mg by mouth daily. 30 tablet 11  .  fenofibrate 160 MG tablet TAKE 1 TABLET BY MOUTH EVERY DAY 30 tablet 5  . lisinopril-hydrochlorothiazide (PRINZIDE,ZESTORETIC) 20-25 MG tablet Take 1 tablet by mouth daily. 30 tablet 11  . metFORMIN (GLUCOPHAGE) 1000 MG tablet TAKE 1 TABLET BY MOUTH TWICE A DAY WITH A MEAL 60 tablet 5   No current facility-administered medications on file prior to visit.   Allergies  Allergen Reactions  . Penicillins     unsure   Social History   Social History  . Marital Status: Married    Spouse Name: N/A  . Number of Children: 2  . Years  of Education: N/A   Occupational History  . Truck driver   .     Social History Main Topics  . Smoking status: Former Smoker -- 1.50 packs/day for 25 years    Types: Cigarettes    Quit date: 08/12/1996  . Smokeless tobacco: Never Used  . Alcohol Use: 0.6 oz/week    1 Cans of beer per week  . Drug Use: No  . Sexual Activity: Yes   Other Topics Concern  . Not on file   Social History Narrative      Review of Systems  All other systems reviewed and are negative.      Objective:   Physical Exam  Constitutional: He appears well-developed and well-nourished.  Neck: No JVD present. No thyromegaly present.  Cardiovascular: Normal rate, regular rhythm, normal heart sounds and intact distal pulses.   No murmur heard. Pulmonary/Chest: Effort normal and breath sounds normal. No respiratory distress. He has no wheezes. He has no rales.  Abdominal: Soft. Bowel sounds are normal. He exhibits no distension. There is no tenderness. There is no rebound and no guarding.  Musculoskeletal: He exhibits no edema.  Skin: Skin is warm. No rash noted.  Vitals reviewed.         Assessment & Plan:   Benign essential HTN - Plan: CBC with Differential/Platelet, COMPLETE METABOLIC PANEL WITH GFR  HLD (hyperlipidemia) - Plan: COMPLETE METABOLIC PANEL WITH GFR, Lipid panel  Controlled type 2 diabetes mellitus without complication, without long-term current use of insulin (HCC) - Plan: CBC with Differential/Platelet, COMPLETE METABOLIC PANEL WITH GFR, Hemoglobin A1c, Lipid panel, Microalbumin, urine  Prostate cancer screening - Plan: PSA  He declines hepatitis C screening. He received Pneumovax 23 today in clinic. I will check a PSA. His blood pressure is excellent. I'll make no changes in his blood pressure medication. I will check a fasting lipid panel. His goal LDL cholesterol is now less than 70 given his history of peripheral vascular disease. Continue aspirin and Plavix. Check a CBC due  to the dual antiplatelet therapy. I will also check a urine microalbumin and hemoglobin A1c. His goal hemoglobin A1c is less than 6.5. Diabetic eye exam is up-to-date.

## 2015-11-14 LAB — MICROALBUMIN, URINE: Microalb, Ur: 0.2 mg/dL

## 2015-11-14 LAB — PSA: PSA: 0.87 ng/mL (ref <=4.00)

## 2015-11-17 ENCOUNTER — Encounter: Payer: Self-pay | Admitting: Family Medicine

## 2015-12-07 ENCOUNTER — Ambulatory Visit (INDEPENDENT_AMBULATORY_CARE_PROVIDER_SITE_OTHER): Payer: BLUE CROSS/BLUE SHIELD | Admitting: Gastroenterology

## 2015-12-07 ENCOUNTER — Encounter: Payer: Self-pay | Admitting: Gastroenterology

## 2015-12-07 VITALS — BP 122/70 | HR 60 | Ht 69.0 in | Wt 219.0 lb

## 2015-12-07 DIAGNOSIS — K51311 Ulcerative (chronic) rectosigmoiditis with rectal bleeding: Secondary | ICD-10-CM

## 2015-12-07 MED ORDER — MESALAMINE 4 G RE ENEM: ENEMA | RECTAL | Status: DC

## 2015-12-07 MED ORDER — PREDNISONE 10 MG PO TABS
ORAL_TABLET | ORAL | Status: DC
Start: 2015-12-07 — End: 2016-03-11

## 2015-12-07 NOTE — Patient Instructions (Addendum)
We have sent the following medications to your pharmacy for you to pick up at your convenience:Prednisone and Rowasa enemas.  Dr. Fuller Plan has advised that you be on a prednisone taper. The taper instructions are as follows:  Take 40 mg x 5 days, 30 mg x 5 days, 20 mg x 5 days, 10 mg x 5 days, 5 mg x 5 days then stop.  Please follow up with Dr. Fuller Plan on 01/15/16 at 10:30am.   Normal BMI (Body Mass Index- based on height and weight) is between 19 and 25. Your BMI today is Body mass index is 32.33 kg/(m^2). Marland Kitchen Please consider follow up  regarding your BMI with your Primary Care Provider.   Thank you for choosing me and [facility].  Pricilla Riffle. Dagoberto Ligas., MD., [facility]

## 2015-12-07 NOTE — Assessment & Plan Note (Signed)
One month flare with frequent bloody diarrhea. Prednisone taper as outlined. Rowasa enema daily x 2 weeks then qod x 2 weeks. REV in 1 month.

## 2015-12-07 NOTE — Progress Notes (Signed)
    History of Present Illness: This is a 60 year old male with ulcerative colitis who has had bloody diarrhea with urgency for 1 month. He was started on Plavix in 06/2015. He noted occasional small volume rectal bleeding since then however urgent bloody diarrhea started 1 month ago. No recent antibiotics or travel. No fevers, abdominal pain, weight loss, N/V. Appetite is very good. Colonoscopy in 03/2015 showed active left sided colitis.   Current Medications, Allergies, Past Medical History, Past Surgical History, Family History and Social History were reviewed in [employer] record.  Physical Exam: General: Well developed, well nourished, no acute distress Head: Normocephalic and atraumatic Eyes:  sclerae anicteric, EOMI Ears: Normal auditory acuity Mouth: No deformity or lesions Lungs: Clear throughout to auscultation Heart: Regular rate and rhythm; no murmurs, rubs or bruits Abdomen: Soft, non tender and non distended. No masses, hepatosplenomegaly or hernias noted. Normal Bowel sounds Musculoskeletal: Symmetrical with no gross deformities  Pulses:  Normal pulses noted Extremities: No clubbing, cyanosis, edema or deformities noted Neurological: Alert oriented x 4, grossly nonfocal Psychological:  Alert and cooperative. Normal mood and affect  Assessment and Recommendations:

## 2015-12-08 ENCOUNTER — Telehealth: Payer: Self-pay | Admitting: Gastroenterology

## 2015-12-08 NOTE — Telephone Encounter (Signed)
Patient had questions about enemas. Questions answered.

## 2015-12-08 NOTE — Telephone Encounter (Signed)
Called patient and lost service and was disconnected.

## 2015-12-25 ENCOUNTER — Other Ambulatory Visit: Payer: Self-pay | Admitting: Family Medicine

## 2016-01-15 ENCOUNTER — Ambulatory Visit: Payer: BLUE CROSS/BLUE SHIELD | Admitting: Gastroenterology

## 2016-02-05 ENCOUNTER — Telehealth: Payer: Self-pay | Admitting: Family Medicine

## 2016-02-05 NOTE — Telephone Encounter (Signed)
Pharmacist called to request that a generic prescription for a glucose meter, test strips and lancets be called in to [facility] [address].

## 2016-02-06 ENCOUNTER — Telehealth: Payer: Self-pay | Admitting: Family Medicine

## 2016-02-06 MED ORDER — BLOOD GLUCOSE METER KIT: PACK | Status: DC

## 2016-02-06 NOTE — Telephone Encounter (Signed)
[facility] and [facility] RX for Diabetic supplies with ICD code on it.  Please fax.

## 2016-02-06 NOTE — Telephone Encounter (Signed)
[facility] printed [facility] to [facility]

## 2016-02-06 NOTE — Telephone Encounter (Signed)
Rx called into pharm

## 2016-02-16 ENCOUNTER — Other Ambulatory Visit: Payer: Self-pay | Admitting: Cardiovascular Disease

## 2016-02-16 DIAGNOSIS — I739 Peripheral vascular disease, unspecified: Secondary | ICD-10-CM

## 2016-02-26 ENCOUNTER — Other Ambulatory Visit: Payer: Self-pay | Admitting: Cardiovascular Disease

## 2016-02-26 ENCOUNTER — Ambulatory Visit (HOSPITAL_COMMUNITY)
Admission: RE | Admit: 2016-02-26 | Discharge: 2016-02-26 | Disposition: A | Discharge status: Home / Self Care | Payer: BLUE CROSS/BLUE SHIELD | Source: Ambulatory Visit | Attending: Cardiovascular Disease | Admitting: Cardiovascular Disease

## 2016-02-26 DIAGNOSIS — I739 Peripheral vascular disease, unspecified: Secondary | ICD-10-CM | POA: Diagnosis not present

## 2016-02-26 DIAGNOSIS — I129 Hypertensive chronic kidney disease with stage 1 through stage 4 chronic kidney disease, or unspecified chronic kidney disease: Secondary | ICD-10-CM | POA: Insufficient documentation

## 2016-02-26 DIAGNOSIS — E785 Hyperlipidemia, unspecified: Secondary | ICD-10-CM | POA: Insufficient documentation

## 2016-02-26 DIAGNOSIS — E1122 Type 2 diabetes mellitus with diabetic chronic kidney disease: Secondary | ICD-10-CM | POA: Insufficient documentation

## 2016-02-26 DIAGNOSIS — N183 Chronic kidney disease, stage 3 (moderate): Secondary | ICD-10-CM | POA: Diagnosis not present

## 2016-02-27 ENCOUNTER — Telehealth: Payer: Self-pay | Admitting: Gastroenterology

## 2016-02-27 MED ORDER — MESALAMINE 4 G RE ENEM: ENEMA | RECTAL | Status: DC

## 2016-02-27 NOTE — Telephone Encounter (Signed)
Patient notified He will keep the appt for 03/11/16  rx refill sent

## 2016-02-27 NOTE — Telephone Encounter (Signed)
Add Rowasa enemas daily for 2 weeks then qod for 2 weeks.

## 2016-02-27 NOTE — Telephone Encounter (Signed)
Patient reports several week history of liquid stools.  He is having small amount of rectal bleeding, liquid stools.  He feels lots of pressure and urgency.  He is currently on colazal 3 TID.  He has a follow up with you on 03/11/16.  Please advise

## 2016-02-28 ENCOUNTER — Other Ambulatory Visit: Payer: Self-pay | Admitting: *Deleted

## 2016-02-28 DIAGNOSIS — I739 Peripheral vascular disease, unspecified: Secondary | ICD-10-CM

## 2016-03-11 ENCOUNTER — Ambulatory Visit (INDEPENDENT_AMBULATORY_CARE_PROVIDER_SITE_OTHER): Payer: BLUE CROSS/BLUE SHIELD | Admitting: Gastroenterology

## 2016-03-11 ENCOUNTER — Encounter: Payer: Self-pay | Admitting: Gastroenterology

## 2016-03-11 DIAGNOSIS — K51311 Ulcerative (chronic) rectosigmoiditis with rectal bleeding: Secondary | ICD-10-CM | POA: Diagnosis not present

## 2016-03-11 MED ORDER — PREDNISONE 10 MG PO TABS: ORAL_TABLET | ORAL | 0 refills | Status: DC

## 2016-03-11 NOTE — Assessment & Plan Note (Addendum)
Recurrent UC flare. Prednisone taper as outlined. Continue balsalazide at current dose. Continue Rowasa enemas daily. If symptoms do not respond or recur soon will check stool for pathogens and consider a biologic and repeat colonoscopy. REV in 8 weeks.

## 2016-03-11 NOTE — Progress Notes (Signed)
    History of Present Illness: This is a 60 year old male with a history of ulcerative proctosigmoiditis complaining of bloody diarrhea. He had a flare in April and was treated with a prednisone taper and Rowasa enemas and his symptoms gradually resolved. He was having normal bowel movements for about 2-3 weeks and then the bloody diarrhea resumed. He states he has a small volume, frequent stools approximately 10 times per day and some have bleeding. No other symptoms. Denies any recent antibiotic usage.  Current Medications, Allergies, Past Medical History, Past Surgical History, Family History and Social History were reviewed in [employer] record.  Physical Exam: General: Well developed, well nourished, no acute distress Head: Normocephalic and atraumatic Eyes:  sclerae anicteric, EOMI Ears: Normal auditory acuity Mouth: No deformity or lesions Lungs: Clear throughout to auscultation Heart: Regular rate and rhythm; no murmurs, rubs or bruits Abdomen: Soft, non tender and non distended. No masses, hepatosplenomegaly or hernias noted. Normal Bowel sounds Musculoskeletal: Symmetrical with no gross deformities  Pulses:  Normal pulses noted Extremities: No clubbing, cyanosis, edema or deformities noted Neurological: Alert oriented x 4, grossly nonfocal Psychological:  Alert and cooperative. Normal mood and affect  Assessment and Recommendations:

## 2016-03-11 NOTE — Patient Instructions (Addendum)
Dr. Fuller Plan has advised that you be on a prednisone taper. The taper instructions are as follows:  Take 40 mg x 5 days, 30 mg x 5 days, 20 mg x 5 days, 10 mg x 5 days, 5 mg x 5 days then stop.  Normal BMI (Body Mass Index- based on height and weight) is between 19 and 25. Your BMI today is Body mass index is 32.48 kg/m. Marland Kitchen Please consider follow up  regarding your BMI with your Primary Care Provider.   Thank you for choosing me and [facility].  Pricilla Riffle. Dagoberto Ligas., MD., [facility] Regal

## 2016-03-23 ENCOUNTER — Other Ambulatory Visit: Payer: Self-pay | Admitting: Family Medicine

## 2016-04-01 ENCOUNTER — Other Ambulatory Visit: Payer: Self-pay | Admitting: Gastroenterology

## 2016-04-19 ENCOUNTER — Other Ambulatory Visit: Payer: Self-pay | Admitting: Cardiovascular Disease

## 2016-04-24 ENCOUNTER — Other Ambulatory Visit: Payer: Self-pay | Admitting: Gastroenterology

## 2016-04-25 ENCOUNTER — Ambulatory Visit: Payer: BLUE CROSS/BLUE SHIELD | Admitting: Gastroenterology

## 2016-05-10 ENCOUNTER — Encounter: Payer: Self-pay | Admitting: Gastroenterology

## 2016-05-13 ENCOUNTER — Ambulatory Visit: Payer: BLUE CROSS/BLUE SHIELD | Admitting: Gastroenterology

## 2016-05-22 ENCOUNTER — Other Ambulatory Visit: Payer: Self-pay | Admitting: Cardiovascular Disease

## 2016-05-22 NOTE — Telephone Encounter (Signed)
Review for refill. 

## 2016-05-24 ENCOUNTER — Other Ambulatory Visit: Payer: Self-pay

## 2016-05-24 MED ORDER — AMLODIPINE BESYLATE 10 MG PO TABS: 10.0000 mg | ORAL_TABLET | Freq: Every day | ORAL | 1 refills | Status: DC

## 2016-05-29 ENCOUNTER — Other Ambulatory Visit: Payer: Self-pay | Admitting: Family Medicine

## 2016-05-29 MED ORDER — FENOFIBRATE 160 MG PO TABS: 160.0000 mg | ORAL_TABLET | Freq: Every day | ORAL | 0 refills | Status: DC

## 2016-06-10 ENCOUNTER — Ambulatory Visit (INDEPENDENT_AMBULATORY_CARE_PROVIDER_SITE_OTHER): Payer: BLUE CROSS/BLUE SHIELD | Admitting: Family Medicine

## 2016-06-10 ENCOUNTER — Encounter: Payer: Self-pay | Admitting: Family Medicine

## 2016-06-10 VITALS — BP 132/70 | HR 60 | Temp 98.1°F | Resp 18 | Ht 70.0 in | Wt 234.0 lb

## 2016-06-10 DIAGNOSIS — E7800 Pure hypercholesterolemia, unspecified: Secondary | ICD-10-CM

## 2016-06-10 DIAGNOSIS — Z23 Encounter for immunization: Secondary | ICD-10-CM | POA: Diagnosis not present

## 2016-06-10 DIAGNOSIS — I10 Essential (primary) hypertension: Secondary | ICD-10-CM | POA: Diagnosis not present

## 2016-06-10 DIAGNOSIS — E785 Hyperlipidemia, unspecified: Secondary | ICD-10-CM | POA: Diagnosis not present

## 2016-06-10 DIAGNOSIS — E119 Type 2 diabetes mellitus without complications: Secondary | ICD-10-CM | POA: Diagnosis not present

## 2016-06-10 LAB — CBC WITH DIFFERENTIAL/PLATELET
BASOS ABS: 52 {cells}/uL (ref 0–200)
Basophils Relative: 1 %
EOS PCT: 3 %
Eosinophils Absolute: 156 cells/uL (ref 15–500)
HCT: 42.1 % (ref 38.5–50.0)
HEMOGLOBIN: 14 g/dL (ref 13.0–17.0)
LYMPHS ABS: 1456 {cells}/uL (ref 850–3900)
Lymphocytes Relative: 28 %
MCH: 28.9 pg (ref 27.0–33.0)
MCHC: 33.3 g/dL (ref 32.0–36.0)
MCV: 86.8 fL (ref 80.0–100.0)
MPV: 10.7 fL (ref 7.5–12.5)
Monocytes Absolute: 624 cells/uL (ref 200–950)
Monocytes Relative: 12 %
NEUTROS ABS: 2912 {cells}/uL (ref 1500–7800)
Neutrophils Relative %: 56 %
Platelets: 241 10*3/uL (ref 140–400)
RBC: 4.85 MIL/uL (ref 4.20–5.80)
RDW: 14.9 % (ref 11.0–15.0)
WBC: 5.2 10*3/uL (ref 3.8–10.8)

## 2016-06-10 LAB — LIPID PANEL
Cholesterol: 135 mg/dL (ref 125–200)
HDL: 29 mg/dL — ABNORMAL LOW
LDL Cholesterol: 32 mg/dL
Total CHOL/HDL Ratio: 4.7 ratio
Triglycerides: 368 mg/dL — ABNORMAL HIGH
VLDL: 74 mg/dL — ABNORMAL HIGH

## 2016-06-10 LAB — COMPLETE METABOLIC PANEL WITHOUT GFR
ALT: 22 U/L (ref 9–46)
AST: 21 U/L (ref 10–35)
Albumin: 4.4 g/dL (ref 3.6–5.1)
Alkaline Phosphatase: 49 U/L (ref 40–115)
BUN: 21 mg/dL (ref 7–25)
CO2: 26 mmol/L (ref 20–31)
Calcium: 9.8 mg/dL (ref 8.6–10.3)
Chloride: 103 mmol/L (ref 98–110)
Creat: 1.47 mg/dL — ABNORMAL HIGH (ref 0.70–1.25)
GFR, Est African American: 59 mL/min — ABNORMAL LOW
GFR, Est Non African American: 51 mL/min — ABNORMAL LOW
Glucose, Bld: 168 mg/dL — ABNORMAL HIGH (ref 70–99)
Potassium: 4.5 mmol/L (ref 3.5–5.3)
Sodium: 139 mmol/L (ref 135–146)
Total Bilirubin: 0.5 mg/dL (ref 0.2–1.2)
Total Protein: 7.1 g/dL (ref 6.1–8.1)

## 2016-06-10 NOTE — Progress Notes (Signed)
Subjective:    Patient ID: Matthew Chen, male    DOB: [birth year], 60 y.o.   MRN: [id number]  HPI  11/2015 Since I last saw the patient, he was diagnosed with peripheral vascular disease with 95% occlusions in both legs. He subsequently underwent catheterization and balloon angioplasty under the care of [name]. The claudication in his legs have completely stopped. He is currently on aspirin and Plavix. Stress test of his heart returned normal. His blood pressure has been well controlled over the last several months. He denies any chest pain shortness of breath or dyspnea on exertion. His eye exam was performed in November and was normal. He declines hepatitis C screening. His blood sugars are ranging between 80 and 150. He denies any hypoglycemic episodes. He denies any polyuria, polydipsia, or blurred vision. He is due for Pneumovax 23. Colonoscopy is up-to-date. He is due for a PSA as well as a urine microalbumin and hemoglobin A1c.  At that time, my plan was: He declines hepatitis C screening. He received Pneumovax 23 today in clinic. I will check a PSA. His blood pressure is excellent. I'll make no changes in his blood pressure medication. I will check a fasting lipid panel. His goal LDL cholesterol is now less than 70 given his history of peripheral vascular disease. Continue aspirin and Plavix. Check a CBC due to the dual antiplatelet therapy. I will also check a urine microalbumin and hemoglobin A1c. His goal hemoglobin A1c is less than 6.5. Diabetic eye exam is up-to-date.  06/10/16 Here for follow up.  Overall he has been doing very well. He denies any chest pain shortness of breath or dyspnea on exertion. He denies any angina. He denies any claudication pains in his leg. He denies any neuropathy or blurry vision. He denies any numbness or tingling in his feet. He denies any myalgias or right upper quadrant pain. Unfortunately he is working 12 hour days 5 days a week and therefore he has very  little time for exercise. His last lab work showed significant dyslipidemia with low HDL cholesterol 27 2 use to be on dual antiplatelet therapy. However he is almost 1 year out from his stents. Past Medical History:  Diagnosis Date  . Adenomatous colon polyp 03/2008  . Arthritis    "joints in my fingers ache" (07/13/2015)  . CKD (chronic kidney disease), stage III   . Diabetes mellitus without complication ([place])   . Hemorrhoids   . Hyperlipidemia   . Hypertension   . Peripheral arterial disease ([place])    a. dopppler 05/29/2015 revealed high-grade right popliteal and distal left SFA disease b. LE angio 06/28/2015 revealing high grade calcific dx with distal L SFA and R popliteal artery s/p diamondback orbital rotational atherectomy, PTA of L SFA  c. 07/13/2015 diamondback orbital rotational atherectomy and drug eluting balloon angioplasty of R popliteal artery (P2 segment) using distal protection   . Type II diabetes mellitus ([place])   . Ulcerative colitis    Past Surgical History:  Procedure Laterality Date  . COLONOSCOPY W/ POLYPECTOMY  X 4-5  . COSMETIC SURGERY  < 2000   "removed birthmark from top of my head"  . PERIPHERAL VASCULAR CATHETERIZATION N/A 06/29/2015   Procedure: Lower Extremity Angiography;  Surgeon: Lorretta Harp, MD;  Location: [facility];  Service: Cardiovascular;  Laterality: N/A;  . PERIPHERAL VASCULAR CATHETERIZATION N/A 07/13/2015   Procedure: Lower Extremity Angiography;  Surgeon: Lorretta Harp, MD;  Location: [place] [facility];  Service: Cardiovascular;  Laterality: N/A;  . PERIPHERAL VASCULAR CATHETERIZATION  07/13/2015   Procedure: Peripheral Vascular Atherectomy;  Surgeon: Lorretta Harp, MD;  Location: [place];  Service: Cardiovascular;;  . TONSILLECTOMY     Current Outpatient Prescriptions on File Prior to Visit  Medication Sig Dispense Refill  . ALPRAZolam (XANAX) 1 MG tablet Take 1 tablet (1 mg total) by mouth at bedtime as needed  for anxiety or sleep. 20 tablet 0  . amLODipine (NORVASC) 10 MG tablet Take 1 tablet (10 mg total) by mouth daily. 90 tablet 1  . aspirin EC 81 MG tablet Take 1 tablet (81 mg total) by mouth daily. 90 tablet 3  . atorvastatin (LIPITOR) 40 MG tablet TAKE 1 TABLET EVERY DAY 30 tablet 3  . balsalazide (COLAZAL) 750 MG capsule TAKE 3 CAPSULES BY MOUTH 3 TIMES A DAY 270 capsule 9  . balsalazide (COLAZAL) 750 MG capsule TAKE 3 CAPSULES BY MOUTH 3 TIMES A DAY 270 capsule 1  . blood glucose meter kit and supplies Dispense based on patient and insurance preference. Check BS daily. 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 Needs strips and lancets #100 /5 refills 1 each 0  . carvedilol (COREG) 25 MG tablet TAKE 1 TABLET (25 MG TOTAL) BY MOUTH 2 (TWO) TIMES DAILY. 60 tablet 6  . clopidogrel (PLAVIX) 75 MG tablet Take 1 tablet (75 mg total) by mouth daily with breakfast. 30 tablet 11  . dapagliflozin propanediol (FARXIGA) 10 MG TABS tablet Take 10 mg by mouth daily. 30 tablet 11  . fenofibrate 160 MG tablet Take 1 tablet (160 mg total) by mouth daily. (Needs office visit and labs before further refills) 90 tablet 0  . lisinopril-hydrochlorothiazide (PRINZIDE,ZESTORETIC) 20-25 MG tablet Take 1 tablet by mouth daily. 30 tablet 11  . mesalamine (ROWASA) 4 g enema Use rectally every day x 2 weeks then use every other day til the end of the next month 30 Bottle 1  . metFORMIN (GLUCOPHAGE) 1000 MG tablet TAKE 1 TABLET BY MOUTH TWICE A DAY WITH A MEAL 60 tablet 5  . predniSONE (DELTASONE) 10 MG tablet Take 40 mg x 5 days, 30 mg x 5 days, 20 mg x 5 days, 10 mg x 5 days, 5 mg x 5 days then stop 53 tablet 0   No current facility-administered medications on file prior to visit.    Allergies  Allergen Reactions  . Penicillins     unsure   Social History   Social History  . Marital status: Married    Spouse name: N/A  . Number of children: 2  . Years of education: N/A   Occupational History  . Truck driver   .  [place] History Main Topics  . Smoking status: Former Smoker    Packs/day: 1.50    Years: 25.00    Types: Cigarettes    Quit date: 08/12/1996  . Smokeless tobacco: Never Used  . Alcohol use 0.6 oz/week    1 Cans of beer per week     Comment: social use  . Drug use: No  . Sexual activity: Yes   Other Topics Concern  . Not on file   Social History Narrative   ** Merged History Encounter **          Review of Systems  All other systems reviewed and are negative.      Objective:   Physical Exam  Constitutional: He appears well-developed and well-nourished.  Neck:  No JVD present. No thyromegaly present.  Cardiovascular: Normal rate, regular rhythm, normal heart sounds and intact distal pulses.   No murmur heard. Pulmonary/Chest: Effort normal and breath sounds normal. No respiratory distress. He has no wheezes. He has no rales.  Abdominal: Soft. Bowel sounds are normal. He exhibits no distension. There is no tenderness. There is no rebound and no guarding.  Musculoskeletal: He exhibits no edema.  Skin: Skin is warm. No rash noted.  Vitals reviewed.         Assessment & Plan:   Benign essential HTN  Pure hypercholesterolemia  Controlled type 2 diabetes mellitus without complication, without long-term current use of insulin ([place]) - Plan: CBC with Differential/Platelet, COMPLETE METABOLIC PANEL WITH GFR, Lipid panel, Hemoglobin A1c, Microalbumin, urine  Dyslipidemia  Overall blood pressure is well controlled today. I will check a hemoglobin A1c. Goal hemoglobin A1c is less than 6.5. I will also check a fasting lipid panel. Goal LDL cholesterol is less than 70. I recommended 30 minutes a day 5 days a week of aerobic exercise to help address his low HDL. Also check triglyceride levels. Goal triglycerides will be less than 150. There is no evidence of myopathy. Patient received his flu shot today. He declined Pneumovax 23. Monitor his chronic kidney disease  with bmp and microalbumen.

## 2016-06-10 NOTE — Addendum Note (Signed)
Addended by: Shary Decamp B on: 06/10/2016 10:41 AM   Modules accepted: Orders

## 2016-06-11 LAB — MICROALBUMIN, URINE

## 2016-06-11 LAB — HEMOGLOBIN A1C
HEMOGLOBIN A1C: 7.4 % — AB (ref <5.7)
MEAN PLASMA GLUCOSE: 166 mg/dL

## 2016-06-14 ENCOUNTER — Other Ambulatory Visit: Payer: Self-pay | Admitting: Family Medicine

## 2016-06-14 MED ORDER — LIRAGLUTIDE 18 MG/3ML ~~LOC~~ SOPN: PEN_INJECTOR | SUBCUTANEOUS | 3 refills | Status: DC

## 2016-06-14 MED ORDER — INSULIN PEN NEEDLE 31G X 5 MM MISC: 5 refills | Status: DC

## 2016-06-18 ENCOUNTER — Other Ambulatory Visit: Payer: Self-pay

## 2016-06-18 ENCOUNTER — Other Ambulatory Visit: Payer: Self-pay | Admitting: Physician Assistant

## 2016-06-18 MED ORDER — BALSALAZIDE DISODIUM 750 MG PO CAPS: ORAL_CAPSULE | ORAL | 0 refills | Status: DC

## 2016-06-19 ENCOUNTER — Other Ambulatory Visit: Payer: Self-pay | Admitting: Family Medicine

## 2016-06-19 MED ORDER — DAPAGLIFLOZIN PROPANEDIOL 10 MG PO TABS: 10.0000 mg | ORAL_TABLET | Freq: Every day | ORAL | 3 refills | Status: DC

## 2016-06-19 NOTE — Telephone Encounter (Signed)
Medication called/sent to requested pharmacy  

## 2016-08-30 ENCOUNTER — Other Ambulatory Visit: Payer: Self-pay

## 2016-08-30 MED ORDER — LISINOPRIL-HYDROCHLOROTHIAZIDE 20-25 MG PO TABS: 1.0000 | ORAL_TABLET | Freq: Every day | ORAL | 0 refills | Status: DC

## 2016-09-05 ENCOUNTER — Other Ambulatory Visit: Payer: Self-pay | Admitting: Cardiovascular Disease

## 2016-09-05 NOTE — Telephone Encounter (Signed)
Refill Request.  

## 2016-09-05 NOTE — Telephone Encounter (Signed)
Rx request sent to pharmacy.  

## 2016-09-09 ENCOUNTER — Ambulatory Visit (INDEPENDENT_AMBULATORY_CARE_PROVIDER_SITE_OTHER): Payer: BLUE CROSS/BLUE SHIELD | Admitting: Cardiovascular Disease

## 2016-09-09 ENCOUNTER — Encounter: Payer: Self-pay | Admitting: Cardiovascular Disease

## 2016-09-09 VITALS — BP 112/75 | HR 70 | Ht 70.0 in | Wt 218.2 lb

## 2016-09-09 DIAGNOSIS — I10 Essential (primary) hypertension: Secondary | ICD-10-CM | POA: Diagnosis not present

## 2016-09-09 DIAGNOSIS — E785 Hyperlipidemia, unspecified: Secondary | ICD-10-CM

## 2016-09-09 DIAGNOSIS — I712 Thoracic aortic aneurysm, without rupture, unspecified: Secondary | ICD-10-CM

## 2016-09-09 DIAGNOSIS — Q231 Congenital insufficiency of aortic valve: Secondary | ICD-10-CM

## 2016-09-09 DIAGNOSIS — I7121 Aneurysm of the ascending aorta, without rupture: Secondary | ICD-10-CM

## 2016-09-09 DIAGNOSIS — Q2381 Bicuspid aortic valve: Secondary | ICD-10-CM | POA: Insufficient documentation

## 2016-09-09 DIAGNOSIS — I350 Nonrheumatic aortic (valve) stenosis: Secondary | ICD-10-CM | POA: Diagnosis not present

## 2016-09-09 HISTORY — DX: Thoracic aortic aneurysm, without rupture, unspecified: I71.20

## 2016-09-09 HISTORY — DX: Bicuspid aortic valve: Q23.81

## 2016-09-09 HISTORY — DX: Congenital insufficiency of aortic valve: Q23.1

## 2016-09-09 HISTORY — DX: Thoracic aortic aneurysm, without rupture: I71.2

## 2016-09-09 MED ORDER — LISINOPRIL-HYDROCHLOROTHIAZIDE 20-25 MG PO TABS: 1.0000 | ORAL_TABLET | Freq: Every day | ORAL | 3 refills | Status: DC

## 2016-09-09 NOTE — Patient Instructions (Signed)
Medication Instructions:  Your physician recommends that you continue on your current medications as directed. Please refer to the Current Medication list given to you today.  Labwork: FASTING LIPID AT [facility] OFFICE WHEN YOU GET YOUR ECHO  Testing/Procedures: Your physician has requested that you have an echocardiogram. Echocardiography is a painless test that uses sound waves to create images of your heart. It provides your doctor with information about the size and shape of your heart and how well your heart's chambers and valves are working. This procedure takes approximately one hour. There are no restrictions for this procedure. [place] [address]  Follow-Up: Your physician wants you to follow-up in: [facility] will receive a reminder letter in the mail two months in advance. If you don't receive a letter, please call our office to schedule the follow-up appointment.  Any Other Special Instructions Will Be Listed Below (If Applicable).     If you need a refill on your cardiac medications before your next appointment, please call your pharmacy.

## 2016-09-09 NOTE — Progress Notes (Signed)
Cardiology Office Note   Date:  09/09/2016   ID:  Matthew Chen, DOB 1956/05/01, MRN [id number]  PCP:  Matthew Fraction, MD  Cardiologist:   Matthew Latch, MD   No chief complaint on file.    Patient ID: Matthew Chen is a 61 y.o. male with mild aortic stenosis, bicuspid aortic valve, moderate diastolic dysfunction, diabetes mellitus, hypertension, hyperlipidemia and anxiety who presents for follow up.  [name] was first seen 04/2015 for resistant hypertension.  He also reported shortness of breath and claudication.  He had an echo 05/2015 hat showed LVEF 65-70% and a bicuspid aortic valve with mild aortic stenosis and a mildly dilated ascending aorta.  He was also referred to Dr. Gwenlyn Chen after Doppler arterial studies revealed high-grade right popliteal and distal left SFA disease.  He underwent angiography on 11/16 that revealed high-grade distal left SFA and right popliteal artery disease.  He underwent PTA of the left SFA in November and had a staged intervention of the right popliteal artery 07/2015.  He was also referred for a sleep study but has not yet completed it.   Since his last appointment [name] has been doing well.  He denies chest pain or shortness of breath. He also hasn't noted any lower extremity edema, orthopnea, PND, palpitations, lightheadedness or dizziness.  [name] does not smoke. He quit 17 years ago. He occasionally drinks alcohol and does not use any illicit drugs. He ha started walking exercises for 30 minutes 5 days per week. Since his last appointment he is lost 16 pounds. He also is eating more salads and trying to more health conscious.   Past Medical History:  Diagnosis Date  . Adenomatous colon polyp 03/2008  . Aortic aneurysm, thoracic ([place]) 09/09/2016   4.4 cm by echo 05/2015  . Arthritis    "joints in my fingers ache" (07/13/2015)  . Bicuspid aortic valve 09/09/2016  . CKD (chronic kidney disease), stage III   . Diabetes mellitus without  complication ([place])   . Hemorrhoids   . Hyperlipidemia   . Hypertension   . Peripheral arterial disease ([place])    a. dopppler 05/29/2015 revealed high-grade right popliteal and distal left SFA disease b. LE angio 06/28/2015 revealing high grade calcific dx with distal L SFA and R popliteal artery s/p diamondback orbital rotational atherectomy, PTA of L SFA  c. 07/13/2015 diamondback orbital rotational atherectomy and drug eluting balloon angioplasty of R popliteal artery (P2 segment) using distal protection   . Type II diabetes mellitus ([place])   . Ulcerative colitis     Past Surgical History:  Procedure Laterality Date  . COLONOSCOPY W/ POLYPECTOMY  X 4-5  . COSMETIC SURGERY  < 2000   "removed birthmark from top of my head"  . PERIPHERAL VASCULAR CATHETERIZATION N/A 06/29/2015   Procedure: Lower Extremity Angiography;  Surgeon: Matthew Harp, MD;  Location: [facility];  Service: Cardiovascular;  Laterality: N/A;  . PERIPHERAL VASCULAR CATHETERIZATION N/A 07/13/2015   Procedure: Lower Extremity Angiography;  Surgeon: Matthew Harp, MD;  Location: [facility];  Service: Cardiovascular;  Laterality: N/A;  . PERIPHERAL VASCULAR CATHETERIZATION  07/13/2015   Procedure: Peripheral Vascular Atherectomy;  Surgeon: Matthew Harp, MD;  Location: [facility];  Service: Cardiovascular;;  . TONSILLECTOMY       Current Outpatient Prescriptions  Medication Sig Dispense Refill  . ALPRAZolam (XANAX) 1 MG tablet Take 1 tablet (1 mg total) by mouth at bedtime as  needed for anxiety or sleep. 20 tablet 0  . amLODipine (NORVASC) 10 MG tablet Take 1 tablet (10 mg total) by mouth daily. 90 tablet 1  . aspirin EC 81 MG tablet Take 1 tablet (81 mg total) by mouth daily. 90 tablet 3  . atorvastatin (LIPITOR) 40 MG tablet TAKE 1 TABLET EVERY DAY 30 tablet 3  . balsalazide (COLAZAL) 750 MG capsule TAKE 3 CAPSULES BY MOUTH 3 TIMES A DAY 810 capsule 0  . blood glucose meter kit and supplies  Dispense based on patient and insurance preference. Check BS daily. 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 Needs strips and lancets #100 /5 refills 1 each 0  . carvedilol (COREG) 25 MG tablet TAKE 1 TABLET (25 MG TOTAL) BY MOUTH 2 (TWO) TIMES DAILY. 60 tablet 6  . clopidogrel (PLAVIX) 75 MG tablet TAKE 1 TABLET BY MOUTH EVERY DAY WITH BREAKFAST 90 tablet 0  . dapagliflozin propanediol (FARXIGA) 10 MG TABS tablet Take 10 mg by mouth daily. 90 tablet 3  . fenofibrate 160 MG tablet Take 1 tablet (160 mg total) by mouth daily. (Needs office visit and labs before further refills) 90 tablet 0  . Insulin Pen Needle 31G X 5 MM MISC Use daily with victoza 100 each 5  . liraglutide 18 MG/3ML SOPN .28m sq daily x 2 weeks then 1.270msq daily thereafter 6 pen 3  . lisinopril-hydrochlorothiazide (PRINZIDE,ZESTORETIC) 20-25 MG tablet Take 1 tablet by mouth daily. 90 tablet 3  . metFORMIN (GLUCOPHAGE) 1000 MG tablet TAKE 1 TABLET BY MOUTH TWICE A DAY WITH A MEAL 60 tablet 5   No current facility-administered medications for this visit.     Allergies:   Penicillins    Social History:  The patient  reports that he quit smoking about 20 years ago. His smoking use included Cigarettes. He has a 37.50 pack-year smoking history. He has never used smokeless tobacco. He reports that he drinks about 0.6 oz of alcohol per week . He reports that he does not use drugs.   Family History:  The patient's family history includes Colon cancer in his maternal grandmother; Colon polyps in his mother; Diabetes in his maternal aunt and mother; Heart disease in his father.    ROS:  Please see the history of present illness.   Otherwise, review of systems are positive for none.   All other systems are reviewed and negative.    PHYSICAL EXAM: VS:  BP 112/75   Pulse 70   Ht 5' 10"  (1.778 m)   Wt 99 kg (218 lb 3.2 oz)   BMI 31.31 kg/m  , BMI Body mass index is 31.31 kg/m. GENERAL:  Well appearing HEENT:  Pupils equal round and reactive, fundi  not visualized, oral mucosa unremarkable NECK:  No jugular venous distention, waveform within normal limits, carotid upstroke brisk and symmetric, no bruits, no thyromegaly LYMPHATICS:  No cervical adenopathy LUNGS:  Clear to auscultation bilaterally HEART:  RRR.  PMI not displaced or sustained,S1 and S2 within normal limits, no S3, no S4, no clicks, no rubs, II/VI early-peaking, crescendo-decrescendo murmur at RUSB ABD:  Flat, positive bowel sounds normal in frequency in pitch, no bruits, no rebound, no guarding, no midline pulsatile mass, no hepatomegaly, no splenomegaly EXT:  2 plus radial pulse, DP/PT bilaterally, no edema, no cyanosis no clubbing SKIN:  No rashes no nodules NEURO:  Cranial nerves II through XII grossly intact, motor grossly intact throughout PSYCH:  Cognitively intact, oriented to person place and time   EKG:  EKG is ordered today. 09/09/16: Sinus rhythm. Rate 70 bpm.   Echo 05/15/15: Study Conclusions  - Left ventricle: The cavity size was normal. There was mild   concentric hypertrophy. Systolic function was vigorous. The   estimated ejection Chen was in the range of 65% to 70%. Wall   motion was normal; there were no regional wall motion   abnormalities. Features are consistent with a pseudonormal left   ventricular filling pattern, with concomitant abnormal relaxation   and increased filling pressure (grade 2 diastolic dysfunction).   Doppler parameters are consistent with elevated ventricular   end-diastolic filling pressure. - Aortic valve: Bicuspid; severely thickened, severely calcified   leaflets. Valve mobility was restricted. There was mild stenosis.   There was trivial regurgitation. Mean gradient (S): 8 mm Hg. Peak   gradient (S): 17 mm Hg. - Aortic root: The aortic root was dilated measuring 44 mm. - Ascending aorta: The ascending aorta was mildly dilated measuring   40 mm. - Mitral valve: Calcified annulus. Mildly thickened leaflets .   There  was mild regurgitation. - Left atrium: The atrium was mildly dilated. - Right atrium: The atrium was normal in size. - Pulmonary arteries: Systolic pressure couldn&'t be assessed as   there was no tricuspid regurgitation. - Inferior vena cava: The vessel was normal in size. - Pericardium, extracardiac: There was no pericardial effusion.  Impressions:  - Bicuspid arotic valve with trivial regurgitation and mild aortic   stenosis.   Mildly dilated aortic root and ascending aorta.  Recent Labs: 06/10/2016: ALT 22; BUN 21; Creat 1.47; Hemoglobin 14.0; Platelets 241; Potassium 4.5; Sodium 139    Lipid Panel    Component Value Date/Time   CHOL 135 06/10/2016 0823   TRIG 368 (H) 06/10/2016 0823   HDL 29 (L) 06/10/2016 0823   CHOLHDL 4.7 06/10/2016 0823   VLDL 74 (H) 06/10/2016 0823   LDLCALC 32 06/10/2016 0823      Wt Readings from Last 3 Encounters:  09/09/16 99 kg (218 lb 3.2 oz)  06/10/16 106.1 kg (234 lb)  03/11/16 101.2 kg (223 lb 2 oz)      Other studies Reviewed: Additional studies/ records that were reviewed today include:   Review of the above records demonstrates:  Please see elsewhere in the note.     ASSESSMENT AND PLAN:  # Hypertension:  Blood pressure is well-controlled today.  Continue hctz/lisinopril, amlodipine and carvedilol.  Outpatient sleep study as advised by Dr. Dennard Schaumann.  Renal artery ultrasound was negative for stenosis.   # Mild aortic stenosis, bicuspid valve: # Mild ascending aortic aneurysm: Echo in 2016 showed mild aortic stenosis with a bicuspid valve. He does not have any symptoms at this time. Repeat echo for interval change in aortic aneurysm.. Clinically asymptomatic.   # PVD: Mr. Groeneveld denies anyclaudication after intervention by Dr. Gwenlyn Chen.  He wonders whether he needs to continue clopidogrel. We'll contact Dr. Gwenlyn Chen.   Continue aspirin, clopidogrel and atorvastatin for now.   Hyperlipidemia: Continue atorvastatin and fenofibrate.  He  was recently started on Victoza.  Hopefully this will help with his triglycerides. Check fasting lipid panel.   Current medicines are reviewed at length with the patient today.  The patient does not have concerns regarding medicines.  The following changes have been made: None  Labs/ tests ordered today include:  Orders Placed This Encounter  Procedures  . Lipid panel  . EKG 12-Lead  . ECHOCARDIOGRAM COMPLETE     Disposition:   FU with  Kaziah Krizek C. Oval Linsey, MD in 1 year.   Signed, Matthew Latch, MD  09/09/2016 1:09 PM    [place]

## 2016-09-12 ENCOUNTER — Other Ambulatory Visit: Payer: Self-pay | Admitting: Physician Assistant

## 2016-09-17 ENCOUNTER — Telehealth: Payer: Self-pay | Admitting: *Deleted

## 2016-09-17 NOTE — Telephone Encounter (Signed)
Advised patient, verbalized understanding  

## 2016-09-17 NOTE — Telephone Encounter (Signed)
-----   Message from Skeet Latch, MD sent at 09/15/2016  9:21 AM EST ----- Please inform Mr. Halsted that [name] wants him to stay on aspirin and Plavix indefinitely unless there bleeding or cost issues.

## 2016-09-23 ENCOUNTER — Other Ambulatory Visit: Payer: Self-pay | Admitting: Cardiovascular Disease

## 2016-09-23 NOTE — Telephone Encounter (Signed)
Please review for refill. Thanks!  

## 2016-09-24 ENCOUNTER — Other Ambulatory Visit: Payer: Self-pay | Admitting: Family Medicine

## 2016-09-24 ENCOUNTER — Other Ambulatory Visit: Payer: Self-pay | Admitting: Cardiovascular Disease

## 2016-09-24 NOTE — Telephone Encounter (Signed)
REFILL 

## 2016-09-24 NOTE — Telephone Encounter (Signed)
Refill request

## 2016-10-04 ENCOUNTER — Other Ambulatory Visit: Payer: Self-pay | Admitting: Family Medicine

## 2016-10-04 NOTE — Telephone Encounter (Signed)
Medication refill for one time only.  Patient needs to be seen.  

## 2016-10-07 ENCOUNTER — Other Ambulatory Visit: Payer: Self-pay

## 2016-10-07 ENCOUNTER — Ambulatory Visit (HOSPITAL_COMMUNITY): Payer: BLUE CROSS/BLUE SHIELD | Attending: Cardiovascular Disease

## 2016-10-07 ENCOUNTER — Other Ambulatory Visit: Payer: BLUE CROSS/BLUE SHIELD | Admitting: *Deleted

## 2016-10-07 DIAGNOSIS — I700 Atherosclerosis of aorta: Secondary | ICD-10-CM | POA: Diagnosis not present

## 2016-10-07 DIAGNOSIS — I7121 Aneurysm of the ascending aorta, without rupture: Secondary | ICD-10-CM

## 2016-10-07 DIAGNOSIS — I712 Thoracic aortic aneurysm, without rupture: Secondary | ICD-10-CM | POA: Diagnosis not present

## 2016-10-07 DIAGNOSIS — E785 Hyperlipidemia, unspecified: Secondary | ICD-10-CM

## 2016-10-07 DIAGNOSIS — I350 Nonrheumatic aortic (valve) stenosis: Secondary | ICD-10-CM | POA: Diagnosis not present

## 2016-10-07 LAB — LIPID PANEL
CHOL/HDL RATIO: 4.6 ratio (ref 0.0–5.0)
Cholesterol, Total: 119 mg/dL (ref 100–199)
HDL: 26 mg/dL — ABNORMAL LOW (ref >39)
LDL CALC: 45 mg/dL (ref 0–99)
TRIGLYCERIDES: 242 mg/dL — AB (ref 0–149)
VLDL CHOLESTEROL CAL: 48 mg/dL — AB (ref 5–40)

## 2016-10-12 ENCOUNTER — Other Ambulatory Visit: Payer: Self-pay | Admitting: Gastroenterology

## 2016-10-21 ENCOUNTER — Telehealth: Payer: Self-pay | Admitting: Gastroenterology

## 2016-10-21 MED ORDER — BALSALAZIDE DISODIUM 750 MG PO CAPS: ORAL_CAPSULE | ORAL | 0 refills | Status: DC

## 2016-10-21 NOTE — Telephone Encounter (Signed)
Prescription sent to patient's pharmacy and patient notified to keep appt for further refills

## 2016-10-27 ENCOUNTER — Other Ambulatory Visit: Payer: Self-pay | Admitting: Family Medicine

## 2016-10-28 NOTE — Telephone Encounter (Signed)
Medication refill for one time only.  Patient needs to be seen.  Letter sent for patient to call and schedule 

## 2016-11-24 ENCOUNTER — Other Ambulatory Visit: Payer: Self-pay | Admitting: Family Medicine

## 2016-11-25 ENCOUNTER — Ambulatory Visit (INDEPENDENT_AMBULATORY_CARE_PROVIDER_SITE_OTHER): Payer: BLUE CROSS/BLUE SHIELD | Admitting: Family Medicine

## 2016-11-25 ENCOUNTER — Encounter: Payer: Self-pay | Admitting: Family Medicine

## 2016-11-25 VITALS — BP 120/76 | HR 76 | Temp 97.8°F | Resp 18 | Ht 70.0 in | Wt 215.0 lb

## 2016-11-25 DIAGNOSIS — E785 Hyperlipidemia, unspecified: Secondary | ICD-10-CM | POA: Diagnosis not present

## 2016-11-25 DIAGNOSIS — E7800 Pure hypercholesterolemia, unspecified: Secondary | ICD-10-CM

## 2016-11-25 DIAGNOSIS — I10 Essential (primary) hypertension: Secondary | ICD-10-CM

## 2016-11-25 DIAGNOSIS — E119 Type 2 diabetes mellitus without complications: Secondary | ICD-10-CM

## 2016-11-25 LAB — COMPLETE METABOLIC PANEL WITH GFR
ALT: 18 U/L (ref 9–46)
AST: 21 U/L (ref 10–35)
Albumin: 4 g/dL (ref 3.6–5.1)
Alkaline Phosphatase: 47 U/L (ref 40–115)
BUN: 19 mg/dL (ref 7–25)
CALCIUM: 9.6 mg/dL (ref 8.6–10.3)
CHLORIDE: 104 mmol/L (ref 98–110)
CO2: 27 mmol/L (ref 20–31)
CREATININE: 1.29 mg/dL — AB (ref 0.70–1.25)
GFR, EST AFRICAN AMERICAN: 69 mL/min (ref >=60)
GFR, Est Non African American: 59 mL/min — ABNORMAL LOW (ref >=60)
Glucose, Bld: 124 mg/dL — ABNORMAL HIGH (ref 70–99)
POTASSIUM: 4.2 mmol/L (ref 3.5–5.3)
Sodium: 140 mmol/L (ref 135–146)
Total Bilirubin: 0.4 mg/dL (ref 0.2–1.2)
Total Protein: 7.1 g/dL (ref 6.1–8.1)

## 2016-11-25 LAB — CBC WITH DIFFERENTIAL/PLATELET
Basophils Absolute: 48 cells/uL (ref 0–200)
Basophils Relative: 1 %
Eosinophils Absolute: 144 cells/uL (ref 15–500)
Eosinophils Relative: 3 %
HEMATOCRIT: 42.5 % (ref 38.5–50.0)
HEMOGLOBIN: 13.9 g/dL (ref 13.0–17.0)
LYMPHS ABS: 1296 {cells}/uL (ref 850–3900)
Lymphocytes Relative: 27 %
MCH: 29 pg (ref 27.0–33.0)
MCHC: 32.7 g/dL (ref 32.0–36.0)
MCV: 88.5 fL (ref 80.0–100.0)
MPV: 10.6 fL (ref 7.5–12.5)
Monocytes Absolute: 672 cells/uL (ref 200–950)
Monocytes Relative: 14 %
NEUTROS PCT: 55 %
Neutro Abs: 2640 cells/uL (ref 1500–7800)
Platelets: 280 10*3/uL (ref 140–400)
RBC: 4.8 MIL/uL (ref 4.20–5.80)
RDW: 14.8 % (ref 11.0–15.0)
WBC: 4.8 10*3/uL (ref 3.8–10.8)

## 2016-11-25 LAB — LIPID PANEL
CHOLESTEROL: 135 mg/dL (ref <200)
HDL: 32 mg/dL — ABNORMAL LOW (ref >40)
LDL Cholesterol: 56 mg/dL (ref <100)
Total CHOL/HDL Ratio: 4.2 Ratio (ref <5.0)
Triglycerides: 233 mg/dL — ABNORMAL HIGH (ref <150)
VLDL: 47 mg/dL — ABNORMAL HIGH (ref <30)

## 2016-11-25 NOTE — Progress Notes (Signed)
Subjective:    Patient ID: Matthew Chen, male    DOB: May 14, 1956, 61 y.o.   MRN: [id number]  Medication Refill     11/2015 Since I last saw the patient, he was diagnosed with peripheral vascular disease with 95% occlusions in both legs. He subsequently underwent catheterization and balloon angioplasty under the care of [name]. The claudication in his legs have completely stopped. He is currently on aspirin and Plavix. Stress test of his heart returned normal. His blood pressure has been well controlled over the last several months. He denies any chest pain shortness of breath or dyspnea on exertion. His eye exam was performed in November and was normal. He declines hepatitis C screening. His blood sugars are ranging between 80 and 150. He denies any hypoglycemic episodes. He denies any polyuria, polydipsia, or blurred vision. He is due for Pneumovax 23. Colonoscopy is up-to-date. He is due for a PSA as well as a urine microalbumin and hemoglobin A1c.  At that time, my plan was: He declines hepatitis C screening. He received Pneumovax 23 today in clinic. I will check a PSA. His blood pressure is excellent. I'll make no changes in his blood pressure medication. I will check a fasting lipid panel. His goal LDL cholesterol is now less than 70 given his history of peripheral vascular disease. Continue aspirin and Plavix. Check a CBC due to the dual antiplatelet therapy. I will also check a urine microalbumin and hemoglobin A1c. His goal hemoglobin A1c is less than 6.5. Diabetic eye exam is up-to-date.  06/10/16 Here for follow up.  Overall he has been doing very well. He denies any chest pain shortness of breath or dyspnea on exertion. He denies any angina. He denies any claudication pains in his leg. He denies any neuropathy or blurry vision. He denies any numbness or tingling in his feet. He denies any myalgias or right upper quadrant pain. Unfortunately he is working 12 hour days 5 days a week and  therefore he has very little time for exercise. His last lab work showed significant dyslipidemia with low HDL cholesterol 27.  Continues to be on dual antiplatelet therapy. However he is almost 1 year out from his stents.  11/25/16 Here for follow up.  Patient recently had ultrasound of the heart and his thoracic aortic aneurysm was not mentioned. It was only mentioned as a mild dilatation in his ascending aorta. He continues to have a cardiac murmur 2/6. He does have a bicuspid aortic valve with calcifications. However he denies any chest pain shortness of breath or dyspnea on exertion. His fasting blood sugars are typically under 110. Yes he states that his two-hour postprandial blood sugars are typically around 110-120. He states that he has lost approximately 14 pounds. He is following a low carbohydrate diet and trying to exercise 30 minutes every day. He denies any neuropathy. Diabetic foot exam is performed today and is normal. He denies any blurry vision, polyuria, dysuria, polydipsia. He denies any myalgias or right upper quadrant pain. Past Medical History:  Diagnosis Date  . Adenomatous colon polyp 03/2008  . Aortic aneurysm, thoracic ([place]) 09/09/2016   4.4 cm by echo 05/2015  . Arthritis    "joints in my fingers ache" (07/13/2015)  . Bicuspid aortic valve 09/09/2016  . CKD (chronic kidney disease), stage III   . Diabetes mellitus without complication ([place])   . Hemorrhoids   . Hyperlipidemia   . Hypertension   . Peripheral arterial disease ([place])  a. dopppler 05/29/2015 revealed high-grade right popliteal and distal left SFA disease b. LE angio 06/28/2015 revealing high grade calcific dx with distal L SFA and R popliteal artery s/p diamondback orbital rotational atherectomy, PTA of L SFA  c. 07/13/2015 diamondback orbital rotational atherectomy and drug eluting balloon angioplasty of R popliteal artery (P2 segment) using distal protection   . Type II diabetes mellitus ([place])   . Ulcerative  colitis    Past Surgical History:  Procedure Laterality Date  . COLONOSCOPY W/ POLYPECTOMY  X 4-5  . COSMETIC SURGERY  < 2000   "removed birthmark from top of my head"  . PERIPHERAL VASCULAR CATHETERIZATION N/A 06/29/2015   Procedure: Lower Extremity Angiography;  Surgeon: Lorretta Harp, MD;  Location: [facility];  Service: Cardiovascular;  Laterality: N/A;  . PERIPHERAL VASCULAR CATHETERIZATION N/A 07/13/2015   Procedure: Lower Extremity Angiography;  Surgeon: Lorretta Harp, MD;  Location: [place] [facility];  Service: Cardiovascular;  Laterality: N/A;  . PERIPHERAL VASCULAR CATHETERIZATION  07/13/2015   Procedure: Peripheral Vascular Atherectomy;  Surgeon: Lorretta Harp, MD;  Location: [facility];  Service: Cardiovascular;;  . TONSILLECTOMY     Current Outpatient Prescriptions on File Prior to Visit  Medication Sig Dispense Refill  . ALPRAZolam (XANAX) 1 MG tablet Take 1 tablet (1 mg total) by mouth at bedtime as needed for anxiety or sleep. 20 tablet 0  . amLODipine (NORVASC) 10 MG tablet Take 1 tablet (10 mg total) by mouth daily. 90 tablet 1  . aspirin EC 81 MG tablet Take 1 tablet (81 mg total) by mouth daily. 90 tablet 3  . atorvastatin (LIPITOR) 40 MG tablet TAKE 1 TABLET BY MOUTH EVERY DAY 30 tablet 10  . balsalazide (COLAZAL) 750 MG capsule TAKE 3 CAPSULES BY MOUTH 3 TIMES A DAY 810 capsule 0  . blood glucose meter kit and supplies Dispense based on patient and insurance preference. Check BS daily. 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 Needs strips and lancets #100 /5 refills 1 each 0  . carvedilol (COREG) 25 MG tablet TAKE 1 TABLET (25 MG TOTAL) BY MOUTH 2 (TWO) TIMES DAILY. 60 tablet 11  . clopidogrel (PLAVIX) 75 MG tablet TAKE 1 TABLET BY MOUTH EVERY DAY WITH BREAKFAST 90 tablet 0  . dapagliflozin propanediol (FARXIGA) 10 MG TABS tablet Take 10 mg by mouth daily. 90 tablet 3  . fenofibrate 160 MG tablet TAKE 1 TABLET BY MOUTH DAILY. (NEEDS OFFICE VISIT AND LABS BEFORE  FURTHER REFILLS) 30 tablet 0  . Insulin Pen Needle 31G X 5 MM MISC Use daily with victoza 100 each 5  . liraglutide 18 MG/3ML SOPN .64m sq daily x 2 weeks then 1.292msq daily thereafter (Patient taking differently: Inject 1.2 mg into the skin daily. ) 6 pen 3  . lisinopril-hydrochlorothiazide (PRINZIDE,ZESTORETIC) 20-25 MG tablet Take 1 tablet by mouth daily. 90 tablet 3  . metFORMIN (GLUCOPHAGE) 1000 MG tablet TAKE 1 TABLET BY MOUTH TWICE A DAY WITH A MEAL 60 tablet 0   No current facility-administered medications on file prior to visit.    Allergies  Allergen Reactions  . Penicillins     unsure   Social History   Social History  . Marital status: Married    Spouse name: N/A  . Number of children: 2  . Years of education: N/A   Occupational History  . Truck driver   .  [employer]  . Smoking status:  Former Smoker    Packs/day: 1.50    Years: 25.00    Types: Cigarettes    Quit date: 08/12/1996  . Smokeless tobacco: Never Used  . Alcohol use 0.6 oz/week    1 Cans of beer per week     Comment: social use  . Drug use: No  . Sexual activity: Yes   Other Topics Concern  . Not on file   Social History Narrative   ** Merged History Encounter **          Review of Systems  All other systems reviewed and are negative.      Objective:   Physical Exam  Constitutional: He appears well-developed and well-nourished.  Neck: No JVD present. No thyromegaly present.  Cardiovascular: Normal rate, regular rhythm and intact distal pulses.   Murmur heard. Pulmonary/Chest: Effort normal and breath sounds normal. No respiratory distress. He has no wheezes. He has no rales.  Abdominal: Soft. Bowel sounds are normal. He exhibits no distension. There is no tenderness. There is no rebound and no guarding.  Musculoskeletal: He exhibits no edema.  Skin: Skin is warm. No rash noted.  Vitals reviewed.         Assessment & Plan:    Benign essential HTN  Pure hypercholesterolemia  Controlled type 2 diabetes mellitus without complication, without long-term current use of insulin (HCC)  Dyslipidemia Blood pressure today is well controlled. I will make no changes in his antihypertensive medication. I will check a fasting lipid panel. With his history of ASCVD, his goal LDL cholesterol is well below 70. He remains on dual antiplatelet therapy at the discretion of his cardiologist. Currently his thoracic aortic aneurysm is stable or even slightly smaller. His cardiac murmurs stable. I will check his hemoglobin A1c. Hemoglobin A1c is less than 6.5, we may discontinue farxiga due to cost.

## 2016-11-26 ENCOUNTER — Encounter: Payer: Self-pay | Admitting: Family Medicine

## 2016-11-26 LAB — HEMOGLOBIN A1C
Hgb A1c MFr Bld: 6.3 % — ABNORMAL HIGH (ref <5.7)
MEAN PLASMA GLUCOSE: 134 mg/dL

## 2016-11-26 LAB — MICROALBUMIN, URINE: Microalb, Ur: 0.2 mg/dL

## 2016-12-03 ENCOUNTER — Encounter: Payer: Self-pay | Admitting: Family Medicine

## 2016-12-09 ENCOUNTER — Encounter: Payer: Self-pay | Admitting: Gastroenterology

## 2016-12-09 ENCOUNTER — Ambulatory Visit (INDEPENDENT_AMBULATORY_CARE_PROVIDER_SITE_OTHER): Payer: BLUE CROSS/BLUE SHIELD | Admitting: Gastroenterology

## 2016-12-09 VITALS — BP 124/62 | HR 62 | Ht 70.5 in | Wt 218.0 lb

## 2016-12-09 DIAGNOSIS — K51011 Ulcerative (chronic) pancolitis with rectal bleeding: Secondary | ICD-10-CM

## 2016-12-09 DIAGNOSIS — Z86010 Personal history of colonic polyps: Secondary | ICD-10-CM | POA: Diagnosis not present

## 2016-12-09 MED ORDER — MESALAMINE 1.2 G PO TBEC: 2.4000 g | DELAYED_RELEASE_TABLET | Freq: Two times a day (BID) | ORAL | 5 refills | Status: DC

## 2016-12-09 NOTE — Progress Notes (Signed)
    History of Present Illness: This is a 61 year old male with ulcerative colitis who relates flares with diarrhea and bleeding occurring about every 2-3 months the last few weeks. Symptoms are easily controlled with prednisone. He has not contacted his for his last few flares. He is wondering about changing medications. His last colonoscopy was performed in 2016 as below. He currently is not having any active symptoms.  Colonoscopy 03/2015: 1. Four sessile polyps in the descending colon and transverse colon; polypectomies performed with cold snare and with cold forceps 2. Pseudopolyps in the descending colon 3. Abnormal mucosa in the left colon; multiple random biopsies performed using cold forceps 4. The colonic mucosa otherwise appeared normal; multiple random biopsies performed using cold forceps 5. The examination was otherwise normal  1. Surgical [P], right colon - BENIGN COLONIC MUCOSA. - NO SIGNIFICANT INFLAMMATION, GRANULOMATA, OR DYSPLASIA IDENTIFIED. 2. Surgical [P], descending and transverse, polyp (4) - TUBULAR ADENOMAS (3 FRAGMENTS) AND POLYPOID COLONIC MUCOSA WITH HYPERPLASTIC CHANGES. - NO HIGH GRADE DYSPLASIA OR INVASIVE MALIGNANCY IDENTIFIED. 3. Surgical [P], left colon - CHRONIC ACTIVE COLITIS. - NO DYSPLASIA OR MALIGNANCY IDENTIFIED.  Current Medications, Allergies, Past Medical History, Past Surgical History, Family History and Social History were reviewed in [employer] record.  Physical Exam: General: Well developed, well nourished, no acute distress Head: Normocephalic and atraumatic Eyes:  sclerae anicteric, EOMI Ears: Normal auditory acuity Mouth: No deformity or lesions Lungs: Clear throughout to auscultation Heart: Regular rate and rhythm; no murmurs, rubs or bruits Abdomen: Soft, non tender and non distended. No masses, hepatosplenomegaly or hernias noted. Normal Bowel sounds Musculoskeletal: Symmetrical with no gross deformities    Pulses:  Normal pulses noted Extremities: No clubbing, cyanosis, edema or deformities noted Neurological: Alert oriented x 4, grossly nonfocal Psychological:  Alert and cooperative. Normal mood and affect  Assessment and Recommendations:  1. UC with frequent activity and adenomatous colon polyps. Surveillance colonoscopy is due in 03/2017. Discussed 6MP, biologics, new 5-ASA, intermittent Uceris, intermittent prednisone. He prefers to change to a new 5-ASA agent. Discontinue balsalazide and begin Lialda 2.4 g twice a day. If this is not successful in adequately controlling his symptoms will recommend 6-MP. Two-year interval surveillance colonoscopy is due in August 2018.  I spent 15 minutes of face-to-face time with the patient. Greater than 50% of the time was spent counseling and coordinating care.

## 2016-12-09 NOTE — Patient Instructions (Signed)
We have sent the following medications to your pharmacy for you to pick up at your convenience: Lialda 1.2 g two tablets by mouth twice daily.   Thank you for choosing me and [facility].  Pricilla Riffle. Dagoberto Ligas., MD., [facility]

## 2016-12-11 ENCOUNTER — Other Ambulatory Visit: Payer: Self-pay | Admitting: Physician Assistant

## 2016-12-26 ENCOUNTER — Other Ambulatory Visit: Payer: Self-pay | Admitting: Cardiovascular Disease

## 2016-12-26 NOTE — Telephone Encounter (Signed)
Please review for refill, Thanks !  

## 2016-12-27 ENCOUNTER — Other Ambulatory Visit: Payer: Self-pay | Admitting: Family Medicine

## 2017-01-28 ENCOUNTER — Encounter: Payer: Self-pay | Admitting: Gastroenterology

## 2017-03-03 ENCOUNTER — Telehealth: Payer: Self-pay | Admitting: Gastroenterology

## 2017-03-03 MED ORDER — BUDESONIDE 9 MG PO TB24: 1.0000 | ORAL_TABLET | Freq: Every day | ORAL | 1 refills | Status: DC

## 2017-03-03 NOTE — Telephone Encounter (Signed)
Patient with a history of UC that reports he is having diarrhea stools, lower abdominal gas, cramping and bleeding.  Symptoms started about 2 weeks ago.  He reports that he has not seen any bleeding for a week or so now.  He is taking mesalamine 2 BID.  Please advise

## 2017-03-03 NOTE — Telephone Encounter (Signed)
Uceris 9 mg daily for 8 weeks or compounded budesonide 10 mg daily for 8 weeks.  Schedule REV within 1-2 weeks with me or APP

## 2017-03-03 NOTE — Telephone Encounter (Signed)
Patient notified Follow up with Ellouise Newer, [place] for 03/17/17 1:15 He will come pick up a coupon for Uceris

## 2017-03-07 ENCOUNTER — Other Ambulatory Visit: Payer: Self-pay | Admitting: Cardiovascular Disease

## 2017-03-07 DIAGNOSIS — I739 Peripheral vascular disease, unspecified: Secondary | ICD-10-CM

## 2017-03-17 ENCOUNTER — Ambulatory Visit: Payer: BLUE CROSS/BLUE SHIELD | Admitting: Physician Assistant

## 2017-03-17 ENCOUNTER — Ambulatory Visit (HOSPITAL_COMMUNITY)
Admission: RE | Admit: 2017-03-17 | Discharge: 2017-03-17 | Disposition: A | Discharge status: Home / Self Care | Payer: BLUE CROSS/BLUE SHIELD | Source: Ambulatory Visit | Attending: Cardiology | Admitting: Cardiology

## 2017-03-17 DIAGNOSIS — Z9862 Peripheral vascular angioplasty status: Secondary | ICD-10-CM | POA: Diagnosis not present

## 2017-03-17 DIAGNOSIS — E785 Hyperlipidemia, unspecified: Secondary | ICD-10-CM | POA: Diagnosis not present

## 2017-03-17 DIAGNOSIS — I10 Essential (primary) hypertension: Secondary | ICD-10-CM | POA: Diagnosis not present

## 2017-03-17 DIAGNOSIS — Z87891 Personal history of nicotine dependence: Secondary | ICD-10-CM | POA: Diagnosis not present

## 2017-03-17 DIAGNOSIS — E1151 Type 2 diabetes mellitus with diabetic peripheral angiopathy without gangrene: Secondary | ICD-10-CM | POA: Insufficient documentation

## 2017-03-17 DIAGNOSIS — I739 Peripheral vascular disease, unspecified: Secondary | ICD-10-CM

## 2017-03-19 ENCOUNTER — Other Ambulatory Visit: Payer: Self-pay | Admitting: Cardiovascular Disease

## 2017-03-19 DIAGNOSIS — I739 Peripheral vascular disease, unspecified: Secondary | ICD-10-CM

## 2017-04-02 ENCOUNTER — Telehealth: Payer: Self-pay

## 2017-04-02 ENCOUNTER — Ambulatory Visit (INDEPENDENT_AMBULATORY_CARE_PROVIDER_SITE_OTHER): Payer: BLUE CROSS/BLUE SHIELD | Admitting: Gastroenterology

## 2017-04-02 ENCOUNTER — Encounter: Payer: Self-pay | Admitting: Gastroenterology

## 2017-04-02 VITALS — BP 124/64 | HR 88 | Ht 70.0 in | Wt 215.2 lb

## 2017-04-02 DIAGNOSIS — Z7902 Long term (current) use of antithrombotics/antiplatelets: Secondary | ICD-10-CM

## 2017-04-02 DIAGNOSIS — K5130 Ulcerative (chronic) rectosigmoiditis without complications: Secondary | ICD-10-CM | POA: Diagnosis not present

## 2017-04-02 MED ORDER — SUPREP BOWEL PREP KIT 17.5-3.13-1.6 GM/177ML PO SOLN: ORAL | 0 refills | Status: DC

## 2017-04-02 NOTE — Telephone Encounter (Signed)
   Matthew Chen 01/06/1956 [id number]  Dear [name]:  We have scheduled the above named patient for a(n) Colonoscopy procedure. Our records show that (s)he is on anticoagulation therapy.  Please advise as to whether the patient may come off their therapy of Plavix for 5 days prior to their procedure which is scheduled for 06-10-17.  Please route your response to Lemar Lofty or fax response to [phone].  Sincerely,    [facility]

## 2017-04-02 NOTE — Progress Notes (Signed)
    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 He had a mild flare several weeks ago and was started on Uceris with complete resolution of symptoms. He has no complaints today. He is maintained on Plavix for PVD and he is due for surveillance colonoscopy.  Current Medications, Allergies, Past Medical History, Past Surgical History, Family History and Social History were reviewed in [employer] record.  Physical Exam: General: Well developed, well nourished, no acute distress Head: Normocephalic and atraumatic Eyes:  sclerae anicteric, EOMI Ears: Normal auditory acuity Mouth: No deformity or lesions Lungs: Clear throughout to auscultation Heart: Regular rate and rhythm; no murmurs, rubs or bruits Abdomen: Soft, non tender and non distended. No masses, hepatosplenomegaly or hernias noted. Normal Bowel sounds Rectal: deferred to colonoscopy Musculoskeletal: Symmetrical with no gross deformities  Pulses:  Normal pulses noted Extremities: No clubbing, cyanosis, edema or deformities noted Neurological: Alert oriented x 4, grossly nonfocal Psychological:  Alert and cooperative. Normal mood and affect  Assessment and Recommendations:  1. Ulcerative proctosigmoiditis. Complete a total of 2 months of Uceris and then discontinue. Continue Lialda 2.4 g twice a day. Schedule colonoscopy. The risks (including bleeding, perforation, infection, missed lesions, medication reactions and possible hospitalization or surgery if complications occur), benefits, and alternatives to colonoscopy with possible biopsy and possible polypectomy were discussed with the patient and they consent to proceed.   2. Hold Plavix 5 days before procedure - will instruct when and how to resume after procedure. Low but real risk of cardiovascular event such as heart attack, stroke, embolism, thrombosis or ischemia/infarct of other organs off Plavix explained and  need to seek urgent help if this occurs. The patient consents to proceed. Will communicate by phone or EMR with patient's prescribing provider to confirm that holding Plavix is reasonable in this case.

## 2017-04-02 NOTE — Patient Instructions (Signed)
You have been scheduled for a colonoscopy. Please follow written instructions given to you at your visit today.  Please pick up your prep supplies at the pharmacy within the next 1-3 days. If you use inhalers (even only as needed), please bring them with you on the day of your procedure. Your physician has requested that you go to www.startemmi.com and enter the access code given to you at your visit today. This web site gives a general overview about your procedure. However, you should still follow specific instructions given to you by our office regarding your preparation for the procedure.  Finish your Uceris, 1 more month for a total of 2 months.  If you are age 67 or older, your body mass index should be between 23-30. Your Body mass index is 30.88 kg/m. If this is out of the aforementioned range listed, please consider follow up with your Primary Care Provider.  If you are age 71 or younger, your body mass index should be between 19-25. Your Body mass index is 30.88 kg/m. If this is out of the aformentioned range listed, please consider follow up with your Primary Care Provider.    Thank you for choosing me and [facility].  Pricilla Riffle. Dagoberto Ligas., MD., Marval Regal

## 2017-04-03 NOTE — Telephone Encounter (Signed)
OK to hold anti platelet Rx for GI procedure

## 2017-04-07 NOTE — Telephone Encounter (Signed)
Patient has been advised that per Dr Gwenlyn Found, he may hold Plavix 5 days prior to colonoscopy procedure. Patient verbalizes understanding.

## 2017-04-21 ENCOUNTER — Other Ambulatory Visit: Payer: Self-pay | Admitting: *Deleted

## 2017-04-21 MED ORDER — METFORMIN HCL 1000 MG PO TABS: ORAL_TABLET | ORAL | 1 refills | Status: DC

## 2017-04-21 MED ORDER — FENOFIBRATE 160 MG PO TABS: 160.0000 mg | ORAL_TABLET | Freq: Every day | ORAL | 1 refills | Status: DC

## 2017-05-03 ENCOUNTER — Other Ambulatory Visit: Payer: Self-pay | Admitting: Gastroenterology

## 2017-05-12 ENCOUNTER — Telehealth: Payer: Self-pay | Admitting: Cardiovascular Disease

## 2017-05-12 NOTE — Telephone Encounter (Signed)
Placed call to pt out of preop pool and scheduled him a f/u appt before he can be cleared for his sx. Pt was offered 05/14/17, but declined, and stated that it needed to be on a Tuesday.  The next available Tuesday was 06/03/17 @ 10:00 with Kerin Ransom, [name], arriving at 10:45.  Pt accepted appt and thanked me for my call.

## 2017-05-12 NOTE — Telephone Encounter (Signed)
   [facility] Pre-operative Risk Assessment    [facility] request for surgical clearance:  1. What type of surgery is being performed? 2 surgical extractions, 1 simple extraction   2. When is this surgery scheduled? TBS   3. Are there any medications that need to be held prior to surgery and how long? Plavix   4. Name of physician performing surgery? Perlie Gold, DDS   5. What is your office phone and fax number? (p) [phone]  (f) [phone]   6. Anesthesia type (None, local, MAC, general) ? None specified    Matthew Chen 05/12/2017, 3:20 PM  _________________________________________________________________   (provider comments below)

## 2017-05-12 NOTE — Telephone Encounter (Signed)
   Chart reviewed as part of pre-operative protocol coverage. Because of Matthew Chen's past medical history and time since last visit (>6 months), he/she will require a follow-up visit in order to better assess preoperative cardiovascular risk.   [name], [name] 05/12/2017, 3:46 PM

## 2017-05-17 ENCOUNTER — Other Ambulatory Visit: Payer: Self-pay | Admitting: Gastroenterology

## 2017-05-26 ENCOUNTER — Other Ambulatory Visit: Payer: Self-pay | Admitting: Family Medicine

## 2017-05-26 DIAGNOSIS — E1165 Type 2 diabetes mellitus with hyperglycemia: Secondary | ICD-10-CM

## 2017-05-26 DIAGNOSIS — I10 Essential (primary) hypertension: Secondary | ICD-10-CM

## 2017-05-26 DIAGNOSIS — N183 Chronic kidney disease, stage 3 unspecified: Secondary | ICD-10-CM

## 2017-05-26 DIAGNOSIS — Z79899 Other long term (current) drug therapy: Secondary | ICD-10-CM

## 2017-05-26 DIAGNOSIS — E785 Hyperlipidemia, unspecified: Secondary | ICD-10-CM

## 2017-05-27 ENCOUNTER — Other Ambulatory Visit: Payer: 59

## 2017-05-27 DIAGNOSIS — I10 Essential (primary) hypertension: Secondary | ICD-10-CM

## 2017-05-27 DIAGNOSIS — E785 Hyperlipidemia, unspecified: Secondary | ICD-10-CM

## 2017-05-27 DIAGNOSIS — E1165 Type 2 diabetes mellitus with hyperglycemia: Secondary | ICD-10-CM

## 2017-05-27 DIAGNOSIS — N183 Chronic kidney disease, stage 3 unspecified: Secondary | ICD-10-CM

## 2017-05-27 DIAGNOSIS — Z79899 Other long term (current) drug therapy: Secondary | ICD-10-CM

## 2017-05-28 LAB — COMPLETE METABOLIC PANEL WITH GFR
AG Ratio: 1.6 (calc) (ref 1.0–2.5)
ALKALINE PHOSPHATASE (APISO): 46 U/L (ref 40–115)
ALT: 24 U/L (ref 9–46)
AST: 22 U/L (ref 10–35)
Albumin: 4.1 g/dL (ref 3.6–5.1)
BUN/Creatinine Ratio: 15 (calc) (ref 6–22)
BUN: 22 mg/dL (ref 7–25)
CO2: 26 mmol/L (ref 20–32)
CREATININE: 1.45 mg/dL — AB (ref 0.70–1.25)
Calcium: 9.3 mg/dL (ref 8.6–10.3)
Chloride: 103 mmol/L (ref 98–110)
GFR, Est African American: 60 mL/min/{1.73_m2} (ref >=60)
GFR, Est Non African American: 52 mL/min/{1.73_m2} — ABNORMAL LOW (ref >=60)
GLOBULIN: 2.6 g/dL (ref 1.9–3.7)
Glucose, Bld: 102 mg/dL — ABNORMAL HIGH (ref 65–99)
POTASSIUM: 4.2 mmol/L (ref 3.5–5.3)
SODIUM: 140 mmol/L (ref 135–146)
Total Bilirubin: 0.5 mg/dL (ref 0.2–1.2)
Total Protein: 6.7 g/dL (ref 6.1–8.1)

## 2017-05-28 LAB — LIPID PANEL
CHOL/HDL RATIO: 4.3 (calc) (ref <5.0)
CHOLESTEROL: 130 mg/dL (ref <200)
HDL: 30 mg/dL — ABNORMAL LOW (ref >40)
LDL CHOLESTEROL (CALC): 62 mg/dL
NON-HDL CHOLESTEROL (CALC): 100 mg/dL (ref <130)
Triglycerides: 290 mg/dL — ABNORMAL HIGH (ref <150)

## 2017-05-28 LAB — HEMOGLOBIN A1C
EAG (MMOL/L): 7.7 (calc)
Hgb A1c MFr Bld: 6.5 % of total Hgb — ABNORMAL HIGH (ref <5.7)
MEAN PLASMA GLUCOSE: 140 (calc)

## 2017-05-28 LAB — CBC WITH DIFFERENTIAL/PLATELET
BASOS PCT: 1.1 %
Basophils Absolute: 53 cells/uL (ref 0–200)
EOS ABS: 130 {cells}/uL (ref 15–500)
Eosinophils Relative: 2.7 %
HEMATOCRIT: 39.3 % (ref 38.5–50.0)
HEMOGLOBIN: 13.3 g/dL (ref 13.2–17.1)
LYMPHS ABS: 1594 {cells}/uL (ref 850–3900)
MCH: 29.5 pg (ref 27.0–33.0)
MCHC: 33.8 g/dL (ref 32.0–36.0)
MCV: 87.1 fL (ref 80.0–100.0)
MONOS PCT: 15.3 %
MPV: 10.9 fL (ref 7.5–12.5)
NEUTROS ABS: 2290 {cells}/uL (ref 1500–7800)
Neutrophils Relative %: 47.7 %
Platelets: 249 10*3/uL (ref 140–400)
RBC: 4.51 10*6/uL (ref 4.20–5.80)
RDW: 13.9 % (ref 11.0–15.0)
Total Lymphocyte: 33.2 %
WBC: 4.8 10*3/uL (ref 3.8–10.8)
WBCMIX: 734 {cells}/uL (ref 200–950)

## 2017-06-03 ENCOUNTER — Encounter: Payer: Self-pay | Admitting: Cardiology

## 2017-06-03 ENCOUNTER — Ambulatory Visit (INDEPENDENT_AMBULATORY_CARE_PROVIDER_SITE_OTHER): Payer: 59 | Admitting: Cardiology

## 2017-06-03 VITALS — BP 127/81 | Ht 70.0 in | Wt 219.0 lb

## 2017-06-03 DIAGNOSIS — I10 Essential (primary) hypertension: Secondary | ICD-10-CM | POA: Diagnosis not present

## 2017-06-03 DIAGNOSIS — I739 Peripheral vascular disease, unspecified: Secondary | ICD-10-CM | POA: Diagnosis not present

## 2017-06-03 DIAGNOSIS — N183 Chronic kidney disease, stage 3 unspecified: Secondary | ICD-10-CM

## 2017-06-03 DIAGNOSIS — E118 Type 2 diabetes mellitus with unspecified complications: Secondary | ICD-10-CM

## 2017-06-03 DIAGNOSIS — Z794 Long term (current) use of insulin: Secondary | ICD-10-CM | POA: Diagnosis not present

## 2017-06-03 DIAGNOSIS — Q231 Congenital insufficiency of aortic valve: Secondary | ICD-10-CM

## 2017-06-03 DIAGNOSIS — Q2381 Bicuspid aortic valve: Secondary | ICD-10-CM

## 2017-06-03 NOTE — Assessment & Plan Note (Signed)
Mild AS by echo Oct 2016

## 2017-06-03 NOTE — Progress Notes (Signed)
06/03/2017 Matthew Chen   Aug 09, 1956  [id number]  Primary Physician Pickard, Cammie Mcgee, MD Primary Cardiologist: Dr Hubbard Hartshorn  HPI:  61 y/o male with a history of bicuspid AOV with mild AS. His echo in Feb 2018 showed calcification of the aortic valve but no significant aortic stenosis. He also has PVD and is s/p LSFA PTA Nov 2016 followed by staged Rt popliteal PTA Dec 2016. LEA dopplers in Aug 2018 showed some progression of right popliteal artery stenosis. Repeat study recommended in 6 months He walks for exercise 4-5 times a week without chest pain or SOB. He is able to walk up stair and uphill with no difficulty.   Rercently he has been diagnosed with ulcerative proctosigmoiditis and needs a colonoscopy (Dr Fuller Plan). He has been cleared to hold his Plavix for this by Dr Gwenlyn Found. In the interm he fell at home and broke some teeth and needs dental extraction ASAP and was referred for cardiac clearance for that as well.    Current Outpatient Prescriptions  Medication Sig Dispense Refill  . amLODipine (NORVASC) 10 MG tablet TAKE 1 TABLET BY MOUTH EVERY DAY 90 tablet 1  . aspirin EC 81 MG tablet Take 1 tablet (81 mg total) by mouth daily. 90 tablet 3  . atorvastatin (LIPITOR) 40 MG tablet TAKE 1 TABLET BY MOUTH EVERY DAY 30 tablet 10  . blood glucose meter kit and supplies Dispense based on patient and insurance preference. Check BS daily. 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 Needs strips and lancets #100 /5 refills 1 each 0  . Budesonide (UCERIS) 9 MG TB24 Take 1 capsule by mouth daily. 30 tablet 1  . carvedilol (COREG) 25 MG tablet TAKE 1 TABLET (25 MG TOTAL) BY MOUTH 2 (TWO) TIMES DAILY. 60 tablet 11  . clopidogrel (PLAVIX) 75 MG tablet TAKE 1 TABLET BY MOUTH EVERY DAY WITH BREAKFAST 90 tablet 2  . dapagliflozin propanediol (FARXIGA) 10 MG TABS tablet Take 10 mg by mouth daily. 90 tablet 3  . fenofibrate 160 MG tablet Take 1 tablet (160 mg total) by mouth daily. 90 tablet 1  . Insulin Pen Needle 31G X 5 MM  MISC Use daily with victoza 100 each 5  . liraglutide 18 MG/3ML SOPN .66m sq daily x 2 weeks then 1.25msq daily thereafter (Patient taking differently: Inject 1.2 mg into the skin daily. ) 6 pen 3  . lisinopril-hydrochlorothiazide (PRINZIDE,ZESTORETIC) 20-25 MG tablet Take 1 tablet by mouth daily. 90 tablet 3  . mesalamine (LIALDA) 1.2 g EC tablet TAKE 2 TABLETS (2.4 G TOTAL) BY MOUTH 2 (TWO) TIMES DAILY. 120 tablet 5  . metFORMIN (GLUCOPHAGE) 1000 MG tablet TAKE 1 TABLET BY MOUTH TWICE A DAY WITH A MEAL 180 tablet 1  . SUPREP BOWEL PREP KIT 17.5-3.13-1.6 GM/180ML SOLN Suprep-Use as directed 354 mL 0   No current facility-administered medications for this visit.     Allergies  Allergen Reactions  . Penicillins Other (See Comments)    Unsure, childhood reaction    Past Medical History:  Diagnosis Date  . Adenomatous colon polyp 03/2008  . Aortic aneurysm, thoracic ([place]/29/2018   4.4 cm by echo 05/2015  . Arthritis    "joints in my fingers ache" (07/13/2015)  . Bicuspid aortic valve 09/09/2016  . CKD (chronic kidney disease), stage III   . Diabetes mellitus without complication ([place]  . Hemorrhoids   . Hyperlipidemia   . Hypertension   . Peripheral arterial disease ([place]   a. dopppler  05/29/2015 revealed high-grade right popliteal and distal left SFA disease b. LE angio 06/28/2015 revealing high grade calcific dx with distal L SFA and R popliteal artery s/p diamondback orbital rotational atherectomy, PTA of L SFA  c. 07/13/2015 diamondback orbital rotational atherectomy and drug eluting balloon angioplasty of R popliteal artery (P2 segment) using distal protection   . Type II diabetes mellitus ([place])   . Ulcerative colitis     Social History   Social History  . Marital status: Married    Spouse name: N/A  . Number of children: 2  . Years of education: N/A   Occupational History  . Truck driver   .  [place] History Main Topics  . Smoking  status: Former Smoker    Packs/day: 1.50    Years: 25.00    Types: Cigarettes    Quit date: 08/12/1996  . Smokeless tobacco: Never Used  . Alcohol use 0.6 oz/week    1 Cans of beer per week     Comment: social use  . Drug use: No  . Sexual activity: Yes   Other Topics Concern  . Not on file   Social History Narrative   ** Merged History Encounter **         Family History  Problem Relation Age of Onset  . Colon cancer Maternal Grandmother        in her 21's  . Colon polyps Mother        in her 25's  . Diabetes Mother   . Heart disease Father   . Diabetes Maternal Aunt        Aunts and Uncles  . Esophageal cancer Neg Hx   . Stomach cancer Neg Hx   . Rectal cancer Neg Hx      Review of Systems: General: negative for chills, fever, night sweats or weight changes.  Cardiovascular: negative for chest pain, dyspnea on exertion, edema, orthopnea, palpitations, paroxysmal nocturnal dyspnea or shortness of breath Dermatological: negative for rash Respiratory: negative for cough or wheezing Urologic: negative for hematuria Abdominal: negative for nausea, vomiting, diarrhea, bright red blood per rectum, melena, or hematemesis Neurologic: negative for visual changes, syncope, or dizziness All other systems reviewed and are otherwise negative except as noted above.    Blood pressure 127/81, height 5' 10"  (1.778 m), weight 219 lb (99.3 kg).  General appearance: alert, cooperative and no distress Neck: no carotid bruit and no JVD Lungs: clear to auscultation bilaterally Heart: regular rate and rhythm and soft systolic murmur AOV, preserved S2, no radiation to CA Abdomen: soft, non-tender; bowel sounds normal; no masses,  no organomegaly Extremities: extremities normal, atraumatic, no cyanosis or edema Pulses: 2+ and symmetric Skin: Skin color, texture, turgor normal. No rashes or lesions Neurologic: Grossly normal  EKG NSR  ASSESSMENT AND PLAN:   Bicuspid aortic  valve Mild AS by echo Oct 2016  Type 2 diabetes mellitus with complication, with long-term current use of insulin (HCC) Type 2 IDDM with CRI  Peripheral arterial disease [facility]) Nov 2016- LSFA PTA Dec 2016- staged Rt popliteal PTA  Essential hypertension Controlled  CKD (chronic kidney disease), stage III Last SCR 1.46 Oct 2018   PLAN  The pt is cleared from a cardiovascular standpoint for both colonoscopy and dental extraction. He does not require SBE prophylaxis for his dental extraction. Holding Plavix is not usually required for dental extraction but in this case we will leave that up to his dentist. Dr Gwenlyn Found has  indicated the pt can hold Plavix pre colonoscopy as required by Dr Fuller Plan.   Kerin Ransom [name] 06/03/2017 11:09 AM

## 2017-06-03 NOTE — Assessment & Plan Note (Signed)
Last [facility] 1.46 Oct 2018

## 2017-06-03 NOTE — Assessment & Plan Note (Signed)
Nov 2016- LSFA PTA Dec 2016- staged Rt popliteal PTA

## 2017-06-03 NOTE — Assessment & Plan Note (Signed)
Controlled.  

## 2017-06-03 NOTE — Patient Instructions (Addendum)
Kerin Ransom, [name] recommends that you schedule a follow-up appointment in JANUARY 2019 with Dr Oval Linsey.  If you need a refill on your cardiac medications before your next appointment, please call your pharmacy.   You may have your dental extractions without SBE prophylaxis. You may also continue taking Clopidogrel (Plavix). If your dentist would like for you to hold it for the extraction, please follow his/her instructions for restarting.

## 2017-06-03 NOTE — Assessment & Plan Note (Signed)
Type 2 IDDM with CRI

## 2017-06-10 ENCOUNTER — Encounter: Payer: Self-pay | Admitting: Gastroenterology

## 2017-06-10 ENCOUNTER — Ambulatory Visit (AMBULATORY_SURGERY_CENTER): Payer: 59 | Admitting: Gastroenterology

## 2017-06-10 VITALS — BP 123/73 | HR 60 | Temp 97.1°F | Resp 16 | Ht 70.0 in | Wt 219.0 lb

## 2017-06-10 DIAGNOSIS — K621 Rectal polyp: Secondary | ICD-10-CM

## 2017-06-10 DIAGNOSIS — K5130 Ulcerative (chronic) rectosigmoiditis without complications: Secondary | ICD-10-CM

## 2017-06-10 DIAGNOSIS — I739 Peripheral vascular disease, unspecified: Secondary | ICD-10-CM | POA: Diagnosis not present

## 2017-06-10 DIAGNOSIS — D128 Benign neoplasm of rectum: Secondary | ICD-10-CM

## 2017-06-10 DIAGNOSIS — D129 Benign neoplasm of anus and anal canal: Secondary | ICD-10-CM

## 2017-06-10 DIAGNOSIS — K5190 Ulcerative colitis, unspecified, without complications: Secondary | ICD-10-CM | POA: Diagnosis not present

## 2017-06-10 DIAGNOSIS — E119 Type 2 diabetes mellitus without complications: Secondary | ICD-10-CM | POA: Diagnosis not present

## 2017-06-10 MED ORDER — SODIUM CHLORIDE 0.9 % IV SOLN: 500.0000 mL | INTRAVENOUS | Status: DC

## 2017-06-10 NOTE — Op Note (Signed)
Matthew Chen Patient Name: Matthew Chen Procedure Date: 06/10/2017 8:38 AM MRN: [id number] Endoscopist: [name] , MD Age: 61 Referring MD:  Date of Birth: 1956/05/23 Gender: Male Account #: [id number] Procedure:                Colonoscopy Indications:              High risk colon cancer surveillance: Ulcerative                            colitis Medicines:                Monitored Anesthesia Care Procedure:                Pre-Anesthesia Assessment:                           - Prior to the procedure, a History and Physical                            was performed, and patient medications and                            allergies were reviewed. The patient's tolerance of                            previous anesthesia was also reviewed. The risks                            and benefits of the procedure and the sedation                            options and risks were discussed with the patient.                            All questions were answered, and informed consent                            was obtained. Prior Anticoagulants: The patient has                            taken Plavix (clopidogrel), last dose was 5 days                            prior to procedure. ASA Grade Assessment: III - A                            patient with severe systemic disease. After                            reviewing the risks and benefits, the patient was                            deemed in satisfactory condition to undergo the  procedure.                           After obtaining informed consent, the colonoscope                            was passed under direct vision. Throughout the                            procedure, the patient's blood pressure, pulse, and                            oxygen saturations were monitored continuously. The                            Model PCF-[id number]) scope was introduced                            through the  anus and advanced to the the cecum,                            identified by appendiceal orifice and ileocecal                            valve. The ileocecal valve, appendiceal orifice,                            and rectum were photographed. The quality of the                            bowel preparation was good. The colonoscopy was                            performed without difficulty. The patient tolerated                            the procedure well. Scope In: 8:48:21 AM Scope Out: 9:10:25 AM Scope Withdrawal Time: 0 hours 19 minutes 29 seconds  Total Procedure Duration: 0 hours 22 minutes 4 seconds  Findings:                 The perianal and digital rectal examinations were                            normal.                           A 7 mm polyp was found in the rectum. The polyp was                            sessile. The polyp was removed with a cold snare.                            Resection and retrieval were complete. To stop  active oozing, one hemostatic clip was successfully                            placed (MR conditional). Attempted 2 additional                            clips however they failed. There was no bleeding at                            the end of the procedure.                           Inflammation characterized by congestion (edema),                            erosions, friability, granularity and loss of                            vascularity was found in a continuous and                            circumferential pattern from the rectum to the                            descending colon. No sites were spared. This was                            mild in severity, and when compared to previous                            examinations, the findings are unchanged. Biopsies                            were taken with a cold forceps for histology. A few                            small psuedopolyps in the descending colon.                            The exam was otherwise without abnormality on                            direct and retroflexion views. Random biopsies                            obtained.                           Non-bleeding internal hemorrhoids were found during                            retroflexion. The hemorrhoids were small and Grade  I (internal hemorrhoids that do not prolapse). Complications:            No immediate complications. Estimated blood loss:                            None. Estimated Blood Loss:     Estimated blood loss: none. Impression:               - One 7 mm polyp in the rectum, removed with a cold                            snare. Resected and retrieved. Clip (MR                            conditional) was placed.                           - Ulcerative colitis. Inflammation was found from                            the rectum to the descending colon. This was mild                            in severity. The findings are unchanged compared to                            previous examinations. Biopsied.                           - A few psuedopolyps in the descending colon.                           - The examination was otherwise normal on direct                            and retroflexion views.                           - Non-bleeding internal hemorrhoids. Recommendation:           - Repeat colonoscopy in 1-3 years for surveillance                            pending pathology review.                           - Resume Plavix (clopidogrel) in 2 days at prior                            dose. Refer to managing physician for further                            adjustment of therapy.                           - Patient has a contact number available for  emergencies. The signs and symptoms of potential                            delayed complications were discussed with the                            patient. Return to normal  activities tomorrow.                            Written discharge instructions were provided to the                            patient.                           - Resume previous diet.                           - Continue present medications.                           - Await pathology results. [name], MD 06/10/2017 9:19:39 AM This report has been signed electronically.

## 2017-06-10 NOTE — Patient Instructions (Addendum)
  RESUME PLAVIX IN 2 DAYS AT PRIOR DOSE. REFER TO MANAGING PHYSICIAN  FOR FURTHER ADJUSTMENT.    YOU HAD AN ENDOSCOPIC PROCEDURE TODAY AT [facility]:   Refer to the procedure report that was given to you for any specific questions about what was found during the examination.  If the procedure report does not answer your questions, please call your gastroenterologist to clarify.  If you requested that your care partner not be given the details of your procedure findings, then the procedure report has been included in a sealed envelope for you to review at your convenience later.  YOU SHOULD EXPECT: Some feelings of bloating in the abdomen. Passage of more gas than usual.  Walking can help get rid of the air that was put into your GI tract during the procedure and reduce the bloating. If you had a lower endoscopy (such as a colonoscopy or flexible sigmoidoscopy) you may notice spotting of blood in your stool or on the toilet paper. If you underwent a bowel prep for your procedure, you may not have a normal bowel movement for a few days.  Please Note:  You might notice some irritation and congestion in your nose or some drainage.  This is from the oxygen used during your procedure.  There is no need for concern and it should clear up in a day or so.  SYMPTOMS TO REPORT IMMEDIATELY:   Following lower endoscopy (colonoscopy or flexible sigmoidoscopy):  Excessive amounts of blood in the stool  Significant tenderness or worsening of abdominal pains  Swelling of the abdomen that is new, acute  Fever of 100F or higher   For urgent or emergent issues, a gastroenterologist can be reached at any hour by calling [phone].   DIET:  We do recommend a small meal at first, but then you may proceed to your regular diet.  Drink plenty of fluids but you should avoid alcoholic beverages for 24 hours.  ACTIVITY:  You should plan to take it easy for the rest of today and you should NOT  DRIVE or use heavy machinery until tomorrow (because of the sedation medicines used during the test).    FOLLOW UP: Our staff will call the number listed on your records the next business day following your procedure to check on you and address any questions or concerns that you may have regarding the information given to you following your procedure. If we do not reach you, we will leave a message.  However, if you are feeling well and you are not experiencing any problems, there is no need to return our call.  We will assume that you have returned to your regular daily activities without incident.  If any biopsies were taken you will be contacted by phone or by letter within the next 1-3 weeks.  Please call us at [phone] if you have not heard about the biopsies in 3 weeks.    SIGNATURES/CONFIDENTIALITY: You and/or your care partner have signed paperwork which will be entered into your electronic medical record.  These signatures attest to the fact that that the information above on your After Visit Summary has been reviewed and is understood.  Full responsibility of the confidentiality of this discharge information lies with you and/or your care-partner.

## 2017-06-10 NOTE — Progress Notes (Signed)
Called to room to assist during endoscopic procedure.  Patient ID and intended procedure confirmed with present staff. Received instructions for my participation in the procedure from the performing physician.  

## 2017-06-10 NOTE — Progress Notes (Signed)
Report to PACU, RN, vss, BBS= Clear.  

## 2017-06-11 ENCOUNTER — Telehealth: Payer: Self-pay | Admitting: *Deleted

## 2017-06-11 NOTE — Telephone Encounter (Signed)
  Follow up Call-  Call back number 06/10/2017 04/10/2015  Post procedure Call Back phone  # [phone]  Permission to leave phone message Yes Yes  Some recent data might be hidden     Patient questions:  Do you have a fever, pain , or abdominal swelling? No. Pain Score  0 *  Have you tolerated food without any problems? Yes.    Have you been able to return to your normal activities? Yes.    Do you have any questions about your discharge instructions: Diet   No. Medications  No. Follow up visit  No.  Do you have questions or concerns about your Care? No.  Actions: * If pain score is 4 or above: No action needed, pain <4.

## 2017-06-14 ENCOUNTER — Other Ambulatory Visit: Payer: Self-pay | Admitting: Family Medicine

## 2017-06-14 DIAGNOSIS — Z23 Encounter for immunization: Secondary | ICD-10-CM | POA: Diagnosis not present

## 2017-06-16 ENCOUNTER — Ambulatory Visit: Payer: BLUE CROSS/BLUE SHIELD | Admitting: Family Medicine

## 2017-06-20 ENCOUNTER — Encounter: Payer: Self-pay | Admitting: Gastroenterology

## 2017-06-20 ENCOUNTER — Telehealth: Payer: Self-pay

## 2017-06-20 NOTE — Telephone Encounter (Signed)
      [facility] Pre-operative Risk Assessment    Request for surgical clearance:  1. What type of surgery is being performed? Extraction of 13 teeth for upper and lower denture   2. When is this surgery scheduled? unknown   3. Are there any medications that need to be held prior to surgery and how long? Plavix ASA   4. Practice name and name of physician performing surgery?  [facility] Dr Perlie Gold  5. What is your office phone and fax number?  [phone] fax [phone]   6. Anesthesia type (None, local, MAC, general) ? Local-[place]   Matthew Chen 06/20/2017, 8:03 AM  _________________________________________________________________   (provider comments below)

## 2017-06-20 NOTE — Telephone Encounter (Signed)
   Chart reviewed as part of pre-operative protocol coverage. Given past medical history and time since last visit, based on ACC/AHA guidelines, Matthew Chen would be at acceptable risk for the planned procedure without further cardiovascular testing. He was seen in clinic 10/23 and it was felt that he may proceed with tooth extraction.  If necessary, he may hold plavix, though he would ideally remain on ASA.  [name], NP 06/20/2017, 4:55 PM

## 2017-06-23 ENCOUNTER — Encounter: Payer: Self-pay | Admitting: Family Medicine

## 2017-06-23 ENCOUNTER — Other Ambulatory Visit: Payer: Self-pay | Admitting: Cardiovascular Disease

## 2017-06-23 ENCOUNTER — Ambulatory Visit (INDEPENDENT_AMBULATORY_CARE_PROVIDER_SITE_OTHER): Payer: 59 | Admitting: Family Medicine

## 2017-06-23 ENCOUNTER — Other Ambulatory Visit: Payer: Self-pay | Admitting: Family Medicine

## 2017-06-23 VITALS — BP 110/62 | HR 68 | Temp 97.9°F | Resp 18 | Ht 70.0 in | Wt 209.0 lb

## 2017-06-23 DIAGNOSIS — I10 Essential (primary) hypertension: Secondary | ICD-10-CM

## 2017-06-23 DIAGNOSIS — E7800 Pure hypercholesterolemia, unspecified: Secondary | ICD-10-CM

## 2017-06-23 DIAGNOSIS — N183 Chronic kidney disease, stage 3 unspecified: Secondary | ICD-10-CM

## 2017-06-23 DIAGNOSIS — E785 Hyperlipidemia, unspecified: Secondary | ICD-10-CM

## 2017-06-23 DIAGNOSIS — E119 Type 2 diabetes mellitus without complications: Secondary | ICD-10-CM | POA: Diagnosis not present

## 2017-06-23 DIAGNOSIS — Z23 Encounter for immunization: Secondary | ICD-10-CM

## 2017-06-23 MED ORDER — SILDENAFIL CITRATE 100 MG PO TABS: 50.0000 mg | ORAL_TABLET | Freq: Every day | ORAL | 11 refills | Status: DC | PRN

## 2017-06-23 NOTE — Addendum Note (Signed)
Addended by: Shary Decamp B on: 06/23/2017 09:49 AM   Modules accepted: Orders

## 2017-06-23 NOTE — Progress Notes (Signed)
Subjective:    Patient ID: Matthew Chen, male    DOB: Aug 25, 1955, 61 y.o.   MRN: [id number]  Medication Refill      He continues to have a cardiac murmur 2/6. He does have a bicuspid aortic valve with calcifications. However he denies any chest pain shortness of breath or dyspnea on exertion.  His blood pressure today is well controlled at 110/62.  He continues to get similar readings at home.  Regarding his diabetes, his hemoglobin A1c is outstanding at 6.5.  He recently had a flu shot at a local pharmacy.  He is due for Pneumovax 23.  He is also due for diabetic eye exam.  His last eye exam was April 2017.  He would like to schedule this on his own.  He is due for hepatitis C and HIV screening but he politely declines this as he denies any risk factors.  His most recent lab work also shows his cholesterol.  He denies any myalgias or right upper quadrant pain.  However he admits to eating more junk food than he should and not exercising enough.  He denies any polyuria, polydipsia, blurry vision.  He denies any neuropathy in his feet. Past Medical History:  Diagnosis Date  . Adenomatous colon polyp 03/2008  . Aortic aneurysm, thoracic ([place]) 09/09/2016   4.4 cm by echo 05/2015  . Arthritis    "joints in my fingers ache" (07/13/2015)  . Bicuspid aortic valve 09/09/2016  . CKD (chronic kidney disease), stage III ([place])   . Clotting disorder ([place])    on plavix for legs  . Diabetes mellitus without complication ([place])   . Hemorrhoids   . Hyperlipidemia   . Hypertension   . Peripheral arterial disease ([place])    a. dopppler 05/29/2015 revealed high-grade right popliteal and distal left SFA disease b. LE angio 06/28/2015 revealing high grade calcific dx with distal L SFA and R popliteal artery s/p diamondback orbital rotational atherectomy, PTA of L SFA  c. 07/13/2015 diamondback orbital rotational atherectomy and drug eluting balloon angioplasty of R popliteal artery (P2 segment) using distal protection     . Type II diabetes mellitus ([place])   . Ulcerative colitis    Past Surgical History:  Procedure Laterality Date  . COLONOSCOPY W/ POLYPECTOMY  X 4-5  . COSMETIC SURGERY  < 2000   "removed birthmark from top of my head"  . TONSILLECTOMY     Current Outpatient Medications on File Prior to Visit  Medication Sig Dispense Refill  . amLODipine (NORVASC) 10 MG tablet TAKE 1 TABLET BY MOUTH EVERY DAY 90 tablet 1  . aspirin EC 81 MG tablet Take 1 tablet (81 mg total) by mouth daily. 90 tablet 3  . atorvastatin (LIPITOR) 40 MG tablet TAKE 1 TABLET BY MOUTH EVERY DAY 30 tablet 10  . blood glucose meter kit and supplies Dispense based on patient and insurance preference. Check BS daily. 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 Needs strips and lancets #100 /5 refills 1 each 0  . Budesonide (UCERIS) 9 MG TB24 Take 1 capsule by mouth daily. 30 tablet 1  . carvedilol (COREG) 25 MG tablet TAKE 1 TABLET (25 MG TOTAL) BY MOUTH 2 (TWO) TIMES DAILY. 60 tablet 11  . clopidogrel (PLAVIX) 75 MG tablet TAKE 1 TABLET BY MOUTH EVERY DAY WITH BREAKFAST 90 tablet 2  . dapagliflozin propanediol (FARXIGA) 10 MG TABS tablet Take 10 mg by mouth daily. 90 tablet 3  . fenofibrate 160 MG tablet Take 1 tablet (  160 mg total) by mouth daily. 90 tablet 1  . Insulin Pen Needle 31G X 5 MM MISC Use daily with victoza 100 each 5  . liraglutide (VICTOZA) 18 MG/3ML SOPN Inject 0.2 mLs (1.2 mg total) daily into the skin. 27 mL 1  . lisinopril-hydrochlorothiazide (PRINZIDE,ZESTORETIC) 20-25 MG tablet Take 1 tablet by mouth daily. 90 tablet 3  . mesalamine (LIALDA) 1.2 g EC tablet TAKE 2 TABLETS (2.4 G TOTAL) BY MOUTH 2 (TWO) TIMES DAILY. 120 tablet 5  . metFORMIN (GLUCOPHAGE) 1000 MG tablet TAKE 1 TABLET BY MOUTH TWICE A DAY WITH A MEAL 180 tablet 1   No current facility-administered medications on file prior to visit.    Allergies  Allergen Reactions  . Penicillins Other (See Comments)    Unsure, childhood reaction   Social History    Socioeconomic History  . Marital status: Married    Spouse name: Not on file  . Number of children: 2  . Years of education: Not on file  . Highest education level: Not on file  Social Needs  . Financial resource strain: Not on file  . Food insecurity - worry: Not on file  . Food insecurity - inability: Not on file  . Transportation needs - medical: Not on file  . Transportation needs - non-medical: Not on file  Occupational History  . Occupation: Truck Education administrator: [employer]  Tobacco Use  . Smoking status: Former Smoker    Packs/day: 1.50    Years: 25.00    Pack years: 37.50    Types: Cigarettes    Last attempt to quit: 08/12/1996    Years since quitting: 20.8  . Smokeless tobacco: Never Used  Substance and Sexual Activity  . Alcohol use: Yes    Alcohol/week: 0.6 oz    Types: 1 Cans of beer per week    Comment: social use  . Drug use: No  . Sexual activity: Yes  Other Topics Concern  . Not on file  Social History Narrative   ** Merged History Encounter **          Review of Systems  All other systems reviewed and are negative.      Objective:   Physical Exam  Constitutional: He appears well-developed and well-nourished.  Neck: No JVD present. No thyromegaly present.  Cardiovascular: Normal rate, regular rhythm and intact distal pulses.  Murmur heard. Pulmonary/Chest: Effort normal and breath sounds normal. No respiratory distress. He has no wheezes. He has no rales.  Abdominal: Soft. Bowel sounds are normal. He exhibits no distension. There is no tenderness. There is no rebound and no guarding.  Musculoskeletal: He exhibits no edema.  Skin: Skin is warm. No rash noted.  Vitals reviewed.         Assessment & Plan:   Benign essential HTN  Pure hypercholesterolemia  Controlled type 2 diabetes mellitus without complication, without long-term current use of insulin (HCC)  Dyslipidemia  CKD (chronic kidney disease), stage  III (HCC) Blood pressure today is well controlled. I will make no changes in his antihypertensive medication.  I recommended a diabetic eye exam.  Diabetic foot exam is normal.  Hemoglobin A1c is outstanding.  Flu shot is up-to-date.  He received Pneumovax 23 today in clinic.  We discussed his chronic kidney disease.  I recommended avoidance of all NSAIDs and drinking plenty of water.  His cholesterol for the most part is well controlled aside from his low HDL which I continue to  encourage aerobic exercise.  Also recommended eating more fresh fruits and vegetables to address his elevated triglycerides.  However his LDL cholesterol is excellent.  Recheck in 6 months.  Colonoscopy was recently performed.  He declines HIV and hepatitis C screening

## 2017-06-23 NOTE — Telephone Encounter (Signed)
Please review for refill, Thanks !  

## 2017-06-23 NOTE — Telephone Encounter (Signed)
Rx has been sent to the pharmacy electronically. ° °

## 2017-06-24 ENCOUNTER — Other Ambulatory Visit: Payer: Self-pay | Admitting: Cardiovascular Disease

## 2017-06-24 NOTE — Telephone Encounter (Signed)
Rx(s) sent to pharmacy electronically.  

## 2017-06-24 NOTE — Telephone Encounter (Signed)
Please review for refill, Thanks !  

## 2017-07-14 ENCOUNTER — Telehealth: Payer: Self-pay | Admitting: Family Medicine

## 2017-07-14 MED ORDER — EMPAGLIFLOZIN 25 MG PO TABS: 25.0000 mg | ORAL_TABLET | Freq: Every day | ORAL | 1 refills | Status: DC

## 2017-07-14 NOTE — Telephone Encounter (Signed)
Will it cover jardiance or invokana.  Ok to switch to either if covered.

## 2017-07-14 NOTE — Telephone Encounter (Signed)
Pharmacy called and ins will no longer cover Farxiga - can we change that to something else?

## 2017-07-14 NOTE — Telephone Encounter (Signed)
Sent over jardiance 59m to [facility] - pt aware

## 2017-07-14 NOTE — Addendum Note (Signed)
Addended by: Shary Decamp B on: 07/14/2017 04:26 PM   Modules accepted: Orders

## 2017-07-16 ENCOUNTER — Encounter: Payer: Self-pay | Admitting: Family Medicine

## 2017-07-16 MED ORDER — CANAGLIFLOZIN 300 MG PO TABS: 300.0000 mg | ORAL_TABLET | Freq: Every day | ORAL | 1 refills | Status: DC

## 2017-07-16 NOTE — Telephone Encounter (Signed)
INS does not cover Jardiance but will cover invokana - changed medication to invokana 321m qd.

## 2017-07-16 NOTE — Addendum Note (Signed)
Addended by: Shary Decamp B on: 07/16/2017 09:32 AM   Modules accepted: Orders

## 2017-07-17 NOTE — Telephone Encounter (Signed)
This encounter was created in error - please disregard.

## 2017-07-17 NOTE — Telephone Encounter (Signed)
ok 

## 2017-07-18 ENCOUNTER — Telehealth: Payer: Self-pay | Admitting: Family Medicine

## 2017-07-18 MED ORDER — CANAGLIFLOZIN 300 MG PO TABS: 300.0000 mg | ORAL_TABLET | Freq: Every day | ORAL | 1 refills | Status: DC

## 2017-07-18 NOTE — Telephone Encounter (Signed)
Approved - Request Reference Number: [id number]. INVOKANA TAB 300MG is approved through 07/18/2018.  Pharm aware

## 2017-07-18 NOTE — Telephone Encounter (Signed)
A submitted through [email or web address] and received the following - [employer] is reviewing your PA request. Typically an electronic response will be received within 72 hours. To check for an update later, open this request from your dashboard. You may close this dialog and return to your dashboard to perform other tasks.

## 2017-07-29 DIAGNOSIS — H25811 Combined forms of age-related cataract, right eye: Secondary | ICD-10-CM | POA: Diagnosis not present

## 2017-07-29 DIAGNOSIS — E119 Type 2 diabetes mellitus without complications: Secondary | ICD-10-CM | POA: Diagnosis not present

## 2017-07-29 DIAGNOSIS — H2512 Age-related nuclear cataract, left eye: Secondary | ICD-10-CM | POA: Diagnosis not present

## 2017-07-29 LAB — HM DIABETES EYE EXAM

## 2017-08-01 ENCOUNTER — Other Ambulatory Visit: Payer: Self-pay | Admitting: Family Medicine

## 2017-08-07 ENCOUNTER — Encounter: Payer: Self-pay | Admitting: Family Medicine

## 2017-08-11 DIAGNOSIS — M1990 Unspecified osteoarthritis, unspecified site: Secondary | ICD-10-CM | POA: Insufficient documentation

## 2017-08-11 DIAGNOSIS — D689 Coagulation defect, unspecified: Secondary | ICD-10-CM | POA: Insufficient documentation

## 2017-08-12 DIAGNOSIS — H269 Unspecified cataract: Secondary | ICD-10-CM

## 2017-08-12 HISTORY — DX: Unspecified cataract: H26.9

## 2017-09-04 NOTE — Progress Notes (Signed)
Cardiology Office Note   Date:  09/08/2017   ID:  Matthew Chen, DOB 04-Sep-1955, MRN [id number]  PCP:  Susy Frizzle, MD  Cardiologist:   Skeet Latch, MD   Chief Complaint  Patient presents with  . Follow-up     Patient ID: Matthew Chen is a 62 y.o. male with mild aortic stenosis, bicuspid aortic valve, moderate diastolic dysfunction, diabetes mellitus, hypertension, hyperlipidemia and anxiety who presents for follow up.  Matthew Chen was first seen 04/2015 for resistant hypertension.  He also reported shortness of breath and claudication.  He had an echo 05/2015 hat showed LVEF 65-70% and a bicuspid aortic valve with mild aortic stenosis and a mildly dilated ascending aorta.  Repeat echo 09/2016 revealed LVEF 55-60% and a heavily calcified aortic valve but no significant stenosis.  He was also referred to [name] after Doppler arterial studies revealed high-grade right popliteal and distal left SFA disease.  He underwent angiography on 11/16 that revealed high-grade distal left SFA and right popliteal artery disease.  He underwent PTA of the left SFA in November and had a staged intervention of the right popliteal artery 07/2015.   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  He currently has a flare that has been ongoing for the last week and a half.  He has mild abdominal discomfort and hematochezia.  He is tried to alter his diet to bland foods.  He is also currently on steroids.  He has no cardiac complaints at this time.  He denies chest pain, shortness of breath, palpitations, lightheadedness, dizziness, lower extremity edema, orthopnea, or PND.  He typically walks 4-5 days/week for 30 or 45 minutes.  He has not been doing this in the time that his ulcerative colitis is flaring.  He has no claudications.  He wonders if he will need to take clopidogrel indefinitely.   Past Medical History:  Diagnosis Date  . Adenomatous colon polyp  03/2008  . Aortic aneurysm, thoracic ([place]) 09/09/2016   4.4 cm by echo 05/2015  . Arthritis    "joints in my fingers ache" (07/13/2015)  . Bicuspid aortic valve 09/09/2016  . CKD (chronic kidney disease), stage III ([place])   . Clotting disorder ([place])    on plavix for legs  . Diabetes mellitus without complication ([place])   . Hemorrhoids   . Hyperlipidemia   . Hypertension   . Peripheral arterial disease ([place])    a. dopppler 05/29/2015 revealed high-grade right popliteal and distal left SFA disease b. LE angio 06/28/2015 revealing high grade calcific dx with distal L SFA and R popliteal artery s/p diamondback orbital rotational atherectomy, PTA of L SFA  c. 07/13/2015 diamondback orbital rotational atherectomy and drug eluting balloon angioplasty of R popliteal artery (P2 segment) using distal protection   . Type II diabetes mellitus ([place])   . Ulcerative colitis     Past Surgical History:  Procedure Laterality Date  . COLONOSCOPY W/ POLYPECTOMY  X 4-5  . COSMETIC SURGERY  < 2000   "removed birthmark from top of my head"  . PERIPHERAL VASCULAR CATHETERIZATION N/A 06/29/2015   Procedure: Lower Extremity Angiography;  Surgeon: Lorretta Harp, MD;  Location: [facility];  Service: Cardiovascular;  Laterality: N/A;  . PERIPHERAL VASCULAR CATHETERIZATION N/A 07/13/2015   Procedure: Lower Extremity Angiography;  Surgeon: Lorretta Harp, MD;  Location: [facility];  Service: Cardiovascular;  Laterality: N/A;  . PERIPHERAL VASCULAR CATHETERIZATION  07/13/2015   Procedure: Peripheral Vascular Atherectomy;  Surgeon: Lorretta Harp, MD;  Location: [facility];  Service: Cardiovascular;;  . TONSILLECTOMY       Current Outpatient Medications  Medication Sig Dispense Refill  . amLODipine (NORVASC) 10 MG tablet TAKE 1 TABLET BY MOUTH EVERY DAY 90 tablet 1  . aspirin EC 81 MG tablet Take 1 tablet (81 mg total) by mouth daily. 90 tablet 3  . atorvastatin (LIPITOR) 40 MG tablet TAKE  1 TABLET BY MOUTH EVERY DAY 30 tablet 11  . blood glucose meter kit and supplies Dispense based on patient and insurance preference. Check BS daily. 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 Needs strips and lancets #100 /5 refills 1 each 0  . Budesonide ER (UCERIS) 9 MG TB24 Take 1 tablet by mouth daily. 30 tablet 1  . canagliflozin (INVOKANA) 300 MG TABS tablet Take 1 tablet (300 mg total) by mouth daily before breakfast. 90 tablet 1  . carvedilol (COREG) 25 MG tablet TAKE 1 TABLET (25 MG TOTAL) BY MOUTH 2 (TWO) TIMES DAILY. 60 tablet 11  . clopidogrel (PLAVIX) 75 MG tablet TAKE 1 TABLET BY MOUTH EVERY DAY WITH BREAKFAST 90 tablet 2  . dicyclomine (BENTYL) 10 MG capsule Take 1 capsule (10 mg total) by mouth 4 (four) times daily -  before meals and at bedtime. 90 capsule 0  . fenofibrate 160 MG tablet Take 1 tablet (160 mg total) by mouth daily. 90 tablet 1  . Insulin Pen Needle (B-D UF III MINI PEN NEEDLES) 31G X 5 MM MISC USE DAILY WITH VICTOZA 100 each 0  . liraglutide (VICTOZA) 18 MG/3ML SOPN Inject 0.2 mLs (1.2 mg total) daily into the skin. 27 mL 1  . lisinopril-hydrochlorothiazide (PRINZIDE,ZESTORETIC) 20-25 MG tablet Take 1 tablet by mouth daily. 90 tablet 3  . mesalamine (LIALDA) 1.2 g EC tablet TAKE 2 TABLETS (2.4 G TOTAL) BY MOUTH 2 (TWO) TIMES DAILY. 120 tablet 5  . mesalamine (ROWASA) 4 g enema Place 60 mLs (4 g total) rectally at bedtime. 30 Bottle 1  . metFORMIN (GLUCOPHAGE) 1000 MG tablet TAKE 1 TABLET BY MOUTH TWICE A DAY WITH A MEAL 180 tablet 1  . sildenafil (VIAGRA) 100 MG tablet Take 0.5-1 tablets (50-100 mg total) daily as needed by mouth for erectile dysfunction. 10 tablet 11  . Budesonide (UCERIS) 9 MG TB24 Take 1 capsule by mouth daily. 30 tablet 1   No current facility-administered medications for this visit.     Allergies:   Penicillins    Social History:  The patient  reports that he quit smoking about 21 years ago. His smoking use included cigarettes. He has a 37.50 pack-year smoking  history. he has never used smokeless tobacco. He reports that he drinks about 0.6 oz of alcohol per week. He reports that he does not use drugs.   Family History:  The patient's family history includes Colon cancer in his maternal grandmother; Colon polyps in his mother; Diabetes in his maternal aunt and mother; Heart disease in his father.    ROS:  Please see the history of present illness.   Otherwise, review of systems are positive for none.   All other systems are reviewed and negative.    PHYSICAL EXAM: VS:  BP 120/68   Pulse 65   Ht 5' 10"  (1.778 m)   Wt 207 lb (93.9 kg)   BMI 29.70 kg/m  , BMI Body mass index is 29.7 kg/m. GENERAL:  Well appearing HEENT: Pupils equal  round and reactive, fundi not visualized, oral mucosa unremarkable NECK:  No jugular venous distention, waveform within normal limits, carotid upstroke brisk and symmetric, no bruits, no thyromegaly LUNGS:  Clear to auscultation bilaterally HEART:  RRR.  PMI not displaced or sustained,S1 and S2 within normal limits, no S3, no S4, no clicks, no rubs, no murmurs ABD:  Flat, positive bowel sounds normal in frequency in pitch, no bruits, no rebound, no guarding, no midline pulsatile mass, no hepatomegaly, no splenomegaly EXT:  2 plus pulses throughout, no edema, no cyanosis no clubbing SKIN:  No rashes no nodules NEURO:  Cranial nerves II through XII grossly intact, motor grossly intact throughout PSYCH:  Cognitively intact, oriented to person place and time    EKG:  EKG is ordered today. 09/09/16: Sinus rhythm. Rate 70 bpm.  09/08/17: Sinus rhythm.  Rate 65 bpm.  Echo 05/15/15: Study Conclusions  - Left ventricle: The cavity size was normal. There was mild   concentric hypertrophy. Systolic function was vigorous. The   estimated ejection fraction was in the range of 65% to 70%. Wall   motion was normal; there were no regional wall motion   abnormalities. Features are consistent with a pseudonormal left    ventricular filling pattern, with concomitant abnormal relaxation   and increased filling pressure (grade 2 diastolic dysfunction).   Doppler parameters are consistent with elevated ventricular   end-diastolic filling pressure. - Aortic valve: Bicuspid; severely thickened, severely calcified   leaflets. Valve mobility was restricted. There was mild stenosis.   There was trivial regurgitation. Mean gradient (S): 8 mm Hg. Peak   gradient (S): 17 mm Hg. - Aortic root: The aortic root was dilated measuring 44 mm. - Ascending aorta: The ascending aorta was mildly dilated measuring   40 mm. - Mitral valve: Calcified annulus. Mildly thickened leaflets .   There was mild regurgitation. - Left atrium: The atrium was mildly dilated. - Right atrium: The atrium was normal in size. - Pulmonary arteries: Systolic pressure couldn&'t be assessed as   there was no tricuspid regurgitation. - Inferior vena cava: The vessel was normal in size. - Pericardium, extracardiac: There was no pericardial effusion.  Echo 10/07/16: Study Conclusions  - Left ventricle: The cavity size was normal. Wall thickness was   normal. Systolic function was normal. The estimated ejection   fraction was in the range of 55% to 60%. Wall motion was normal;   there were no regional wall motion abnormalities. Left   ventricular diastolic function parameters were normal. - Aortic valve: Severe focal calcification involving the   noncoronary cusp. Valve area (VTI): 2.54 cm^2. Valve area (Vmax):   2.08 cm^2. Valve area (Vmean): 2.1 cm^2.   Recent Labs: 05/27/2017: ALT 24; BUN 22; Creat 1.45; Hemoglobin 13.3; Platelets 249; Potassium 4.2; Sodium 140    Lipid Panel    Component Value Date/Time   CHOL 130 05/27/2017 0808   CHOL 119 10/07/2016 0843   TRIG 290 (H) 05/27/2017 0808   HDL 30 (L) 05/27/2017 0808   HDL 26 (L) 10/07/2016 0843   CHOLHDL 4.3 05/27/2017 0808   VLDL 47 (H) 11/25/2016 0838   LDLCALC 56 11/25/2016  0838   LDLCALC 45 10/07/2016 0843      Wt Readings from Last 3 Encounters:  09/08/17 207 lb (93.9 kg)  06/23/17 209 lb (94.8 kg)  06/10/17 219 lb (99.3 kg)      Other studies Reviewed: Additional studies/ records that were reviewed today include:   Review of the  above records demonstrates:  Please see elsewhere in the note.     ASSESSMENT AND PLAN:  # Hypertension:  Blood pressure is well-controlled today.  Continue amlodipine, carvedilol, lisinopril, and hydrochlorothiazide.  Renal artery ultrasound was negative for stenosis.    # Mild aortic stenosis, bicuspid valve: # Mild ascending aortic aneurysm: Echo in 2016 showed mild aortic stenosis with a bicuspid valve.  Repeat echo revealed a mean gradient of 8 mmHg 310/2018.  No repeat needed at this time.  # PVD: Matthew Chen denies anyclaudication after intervention by [name].  He wonders whether he needs to continue clopidogrel.  He has not seen Dr. Alvester Chou since 2016.  We will arrange follow-up.  Hyperlipidemia: Continue atorvastatin and fenofibrate.  LDL 45 on 05/2017.  Current medicines are reviewed at length with the patient today.  The patient does not have concerns regarding medicines.  The following changes have been made: None  Labs/ tests ordered today include:  No orders of the defined types were placed in this encounter.    Disposition:   FU with Tayvion Lauder C. Oval Linsey, MD in 1 year.   Signed, Skeet Latch, MD  09/08/2017 8:46 AM    [place]

## 2017-09-05 ENCOUNTER — Telehealth: Payer: Self-pay | Admitting: Gastroenterology

## 2017-09-05 MED ORDER — BUDESONIDE ER 9 MG PO TB24: 1.0000 | ORAL_TABLET | Freq: Every day | ORAL | 1 refills | Status: DC

## 2017-09-05 MED ORDER — MESALAMINE 4 G RE ENEM: 4.0000 g | ENEMA | Freq: Every day | RECTAL | 1 refills | Status: DC

## 2017-09-05 MED ORDER — DICYCLOMINE HCL 10 MG PO CAPS: 10.0000 mg | ORAL_CAPSULE | Freq: Three times a day (TID) | ORAL | 0 refills | Status: DC

## 2017-09-05 NOTE — Telephone Encounter (Signed)
Yes ok for budesonide 10 mg from [facility] if Uceris not affordable

## 2017-09-05 NOTE — Telephone Encounter (Signed)
Patient with a history of UC c/o urgency, diarrhea, rectal bleeding for the last week. 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  Encouraged to come in this pm to see APP, but states he can't get off work and wants "something called in".  Please advise

## 2017-09-05 NOTE — Telephone Encounter (Signed)
Rowasa enemas prn hs for 1 month Uceris po qd for 2 months Dicyclomine 10 mg tid until diarrhea, urgency improves Schedule REV

## 2017-09-05 NOTE — Telephone Encounter (Signed)
Patient notified I will send the prescriptions.  He will notify us if they are not affordable.  He is concerned about price of Uceris.  Ok to send budesonide 10 mg to [facility] pharmacy if not affordable?

## 2017-09-07 ENCOUNTER — Other Ambulatory Visit: Payer: Self-pay | Admitting: Cardiovascular Disease

## 2017-09-08 ENCOUNTER — Encounter: Payer: Self-pay | Admitting: Cardiovascular Disease

## 2017-09-08 ENCOUNTER — Ambulatory Visit (INDEPENDENT_AMBULATORY_CARE_PROVIDER_SITE_OTHER): Payer: 59 | Admitting: Cardiovascular Disease

## 2017-09-08 VITALS — BP 120/68 | HR 65 | Ht 70.0 in | Wt 207.0 lb

## 2017-09-08 DIAGNOSIS — I10 Essential (primary) hypertension: Secondary | ICD-10-CM | POA: Diagnosis not present

## 2017-09-08 DIAGNOSIS — Q231 Congenital insufficiency of aortic valve: Secondary | ICD-10-CM

## 2017-09-08 DIAGNOSIS — Q2381 Bicuspid aortic valve: Secondary | ICD-10-CM

## 2017-09-08 DIAGNOSIS — E785 Hyperlipidemia, unspecified: Secondary | ICD-10-CM | POA: Diagnosis not present

## 2017-09-08 DIAGNOSIS — I739 Peripheral vascular disease, unspecified: Secondary | ICD-10-CM

## 2017-09-08 NOTE — Patient Instructions (Signed)
Medication Instructions:  Your physician recommends that you continue on your current medications as directed. Please refer to the Current Medication list given to you today.  Labwork: NONE  Testing/Procedures: NONE  Follow-Up: Your physician wants you to follow-up in: [place] will receive a reminder letter in the mail two months in advance. If you don't receive a letter, please call our office to schedule the follow-up appointment.  Your physician recommends that you schedule a follow-up appointment in: DR BERRY NEXT AVAILABLE   If you need a refill on your cardiac medications before your next appointment, please call your pharmacy.

## 2017-09-08 NOTE — Telephone Encounter (Signed)
Please review for refill, Thanks !  

## 2017-09-08 NOTE — Telephone Encounter (Signed)
Rx request sent to pharmacy.  

## 2017-09-16 ENCOUNTER — Ambulatory Visit (INDEPENDENT_AMBULATORY_CARE_PROVIDER_SITE_OTHER): Payer: 59 | Admitting: Cardiovascular Disease

## 2017-09-16 ENCOUNTER — Encounter: Payer: Self-pay | Admitting: Cardiovascular Disease

## 2017-09-16 DIAGNOSIS — I739 Peripheral vascular disease, unspecified: Secondary | ICD-10-CM

## 2017-09-16 NOTE — Assessment & Plan Note (Signed)
History of peripheral arterial disease status post diamondback orbital rotational atherectomy, drug-eluting balloon angioplasty of the distal left SFA for high-grade subocclusive calcific/exophytic plaque 06/29/15 was staged distal right SFA/popliteal disease intervention using similar techniques 07/13/15. He started well since. He denies claudication. Dopplers performed 03/17/17 revealed normal ABIs bilaterally with a high-frequency signal in his right popliteal artery. We'll continue to follow him noninvasively I annual basis. Should he become symptomatic we can re-intervene at any time.

## 2017-09-16 NOTE — Patient Instructions (Signed)
Medication Instructions: Your physician recommends that you continue on your current medications as directed. Please refer to the Current Medication list given to you today.   Testing/Procedures:  DUE IN AUGUST 2019: Your physician has requested that you have a lower extremity arterial duplex. During this test, ultrasound is used to evaluate arterial blood flow in the legs. Allow one hour for this exam. There are no restrictions or special instructions.  Your physician has requested that you have an ankle brachial index (ABI). During this test an ultrasound and blood pressure cuff are used to evaluate the arteries that supply the arms and legs with blood. Allow thirty minutes for this exam. There are no restrictions or special instructions.  Follow-Up: Your physician recommends that you schedule a follow-up appointment as needed with [name].

## 2017-09-16 NOTE — Progress Notes (Signed)
09/16/2017 Matthew Chen   Mar 16, 1956  [id number]  Primary Physician Pickard, Cammie Mcgee, MD Primary Cardiologist: Lorretta Harp MD Garret Reddish, [place], [place]  HPI:  Matthew Chen is a 62 y.o.  mild to moderately overweight married Caucasian male father of 4 children, grandfather of a grandchildren was referred by Dr. Skeet Latch for evaluation and treatment of claudication. I last saw him in the office 07/31/15.He has a history of treated hypertension, diabetes and hyperlipidemia. 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 He's never had a heart attack or stroke and denies chest pain or shortness of breath. He does complain of lifestyle limiting claudication and had arterial Doppler studies performed in our office 05/29/15 revealing high-grade right popliteal and distal left SFA disease. Based on this, we decided to proceed with angiography and potential percutaneous revascularization. I initially angiogrammed him on 06/29/15 and performed diamondback orbital rotational atherectomy, drug eluting balloon angioplasty of the distal left SFA for a high-grade, subocclusive calcific/exophytic plaque. He had a high-grade calcific/exophytic subocclusive right popliteal artery plaque for which he underwent diamondback orbital rotation arthrectomy, drug eluding balloon angioplasty on 07/13/15 with an excellent angiographic result. Since that time his claudication has resolved. His Dopplers performed 03/17/17 revealed normal ABIs bilaterally with the development of a high-frequency signal in his right popliteal artery although he denies claudication.   Current Meds  Medication Sig  . amLODipine (NORVASC) 10 MG tablet TAKE 1 TABLET BY MOUTH EVERY DAY  . aspirin EC 81 MG tablet Take 1 tablet (81 mg total) by mouth daily.  Marland Kitchen atorvastatin (LIPITOR) 40 MG tablet TAKE 1 TABLET BY MOUTH EVERY DAY  . blood glucose meter kit and supplies Dispense based on patient and insurance  preference. Check BS daily. 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 Needs strips and lancets #100 /5 refills  . Budesonide (UCERIS) 9 MG TB24 Take 1 capsule by mouth daily.  . Budesonide ER (UCERIS) 9 MG TB24 Take 1 tablet by mouth daily.  . canagliflozin (INVOKANA) 300 MG TABS tablet Take 1 tablet (300 mg total) by mouth daily before breakfast.  . carvedilol (COREG) 25 MG tablet TAKE 1 TABLET (25 MG TOTAL) BY MOUTH 2 (TWO) TIMES DAILY.  Marland Kitchen clopidogrel (PLAVIX) 75 MG tablet TAKE 1 TABLET BY MOUTH EVERY DAY WITH BREAKFAST  . dicyclomine (BENTYL) 10 MG capsule Take 1 capsule (10 mg total) by mouth 4 (four) times daily -  before meals and at bedtime.  . fenofibrate 160 MG tablet Take 1 tablet (160 mg total) by mouth daily.  . Insulin Pen Needle (B-D UF III MINI PEN NEEDLES) 31G X 5 MM MISC USE DAILY WITH VICTOZA  . liraglutide (VICTOZA) 18 MG/3ML SOPN Inject 0.2 mLs (1.2 mg total) daily into the skin.  Marland Kitchen lisinopril-hydrochlorothiazide (PRINZIDE,ZESTORETIC) 20-25 MG tablet Take 1 tablet by mouth daily.  . mesalamine (LIALDA) 1.2 g EC tablet TAKE 2 TABLETS (2.4 G TOTAL) BY MOUTH 2 (TWO) TIMES DAILY.  . mesalamine (ROWASA) 4 g enema Place 60 mLs (4 g total) rectally at bedtime.  . metFORMIN (GLUCOPHAGE) 1000 MG tablet TAKE 1 TABLET BY MOUTH TWICE A DAY WITH A MEAL  . sildenafil (VIAGRA) 100 MG tablet Take 0.5-1 tablets (50-100 mg total) daily as needed by mouth for erectile dysfunction.     Allergies  Allergen Reactions  . Penicillins Other (See Comments)    Unsure, childhood reaction    Social History   Socioeconomic History  .  Marital status: Married    Spouse name: Not on file  . Number of children: 2  . Years of education: Not on file  . Highest education level: Not on file  Social Needs  . Financial resource strain: Not on file  . Food insecurity - worry: Not on file  . Food insecurity - inability: Not on file  . Transportation needs - medical: Not on file  . Transportation needs - non-medical: Not  on file  Occupational History  . Occupation: Truck Education administrator: Chief Financial Officer  Tobacco Use  . Smoking status: Former Smoker    Packs/day: 1.50    Years: 25.00    Pack years: 37.50    Types: Cigarettes    Last attempt to quit: 08/12/1996    Years since quitting: 21.1  . Smokeless tobacco: Never Used  Substance and Sexual Activity  . Alcohol use: Yes    Alcohol/week: 0.6 oz    Types: 1 Cans of beer per week    Comment: social use  . Drug use: No  . Sexual activity: Yes  Other Topics Concern  . Not on file  Social History Narrative   ** Merged History Encounter **         Review of Systems: General: negative for chills, fever, night sweats or weight changes.  Cardiovascular: negative for chest pain, dyspnea on exertion, edema, orthopnea, palpitations, paroxysmal nocturnal dyspnea or shortness of breath Dermatological: negative for rash Respiratory: negative for cough or wheezing Urologic: negative for hematuria Abdominal: negative for nausea, vomiting, diarrhea, bright red blood per rectum, melena, or hematemesis Neurologic: negative for visual changes, syncope, or dizziness All other systems reviewed and are otherwise negative except as noted above.    Blood pressure 114/60, pulse 64, height 5' 10"  (1.778 m), weight 206 lb (93.4 kg).  General appearance: alert and no distress Neck: no adenopathy, no carotid bruit, no JVD, supple, symmetrical, trachea midline and thyroid not enlarged, symmetric, no tenderness/mass/nodules Lungs: clear to auscultation bilaterally Heart: regular rate and rhythm, S1, S2 normal, no murmur, click, rub or gallop Extremities: extremities normal, atraumatic, no cyanosis or edema Pulses: 2+ and symmetric Skin: Skin color, texture, turgor normal. No rashes or lesions Neurologic: Alert and oriented X 3, normal strength and tone. Normal symmetric reflexes. Normal coordination and gait  EKG not performed today  ASSESSMENT AND  PLAN:   Peripheral arterial disease ([place]) History of peripheral arterial disease status post diamondback orbital rotational atherectomy, drug-eluting balloon angioplasty of the distal left SFA for high-grade subocclusive calcific/exophytic plaque 06/29/15 was staged distal right SFA/popliteal disease intervention using similar techniques 07/13/15. He started well since. He denies claudication. Dopplers performed 03/17/17 revealed normal ABIs bilaterally with a high-frequency signal in his right popliteal artery. We'll continue to follow him noninvasively I annual basis. Should he become symptomatic we can re-intervene at any time.      Lorretta Harp MD FACP,FACC,FAHA, [facility] 09/16/2017 4:10 PM

## 2017-09-20 ENCOUNTER — Other Ambulatory Visit: Payer: Self-pay | Admitting: Cardiovascular Disease

## 2017-09-22 NOTE — Telephone Encounter (Signed)
Please review for refill, Thanks !  

## 2017-09-25 ENCOUNTER — Other Ambulatory Visit: Payer: Self-pay | Admitting: Gastroenterology

## 2017-09-28 ENCOUNTER — Other Ambulatory Visit: Payer: Self-pay | Admitting: Cardiovascular Disease

## 2017-09-29 NOTE — Telephone Encounter (Signed)
Refill Request.  

## 2017-10-13 ENCOUNTER — Other Ambulatory Visit: Payer: Self-pay | Admitting: Family Medicine

## 2017-10-27 ENCOUNTER — Other Ambulatory Visit: Payer: Self-pay | Admitting: Family Medicine

## 2017-11-08 ENCOUNTER — Other Ambulatory Visit: Payer: Self-pay | Admitting: Gastroenterology

## 2017-11-24 ENCOUNTER — Other Ambulatory Visit: Payer: Self-pay | Admitting: Gastroenterology

## 2017-12-02 ENCOUNTER — Other Ambulatory Visit: Payer: Self-pay | Admitting: Gastroenterology

## 2017-12-16 ENCOUNTER — Other Ambulatory Visit: Payer: Self-pay | Admitting: Cardiovascular Disease

## 2017-12-25 ENCOUNTER — Other Ambulatory Visit: Payer: Self-pay | Admitting: Gastroenterology

## 2017-12-31 ENCOUNTER — Other Ambulatory Visit: Payer: Self-pay | Admitting: Family Medicine

## 2018-01-25 ENCOUNTER — Other Ambulatory Visit: Payer: Self-pay | Admitting: Gastroenterology

## 2018-02-02 ENCOUNTER — Other Ambulatory Visit (INDEPENDENT_AMBULATORY_CARE_PROVIDER_SITE_OTHER): Payer: 59

## 2018-02-02 ENCOUNTER — Encounter: Payer: Self-pay | Admitting: Gastroenterology

## 2018-02-02 ENCOUNTER — Ambulatory Visit (INDEPENDENT_AMBULATORY_CARE_PROVIDER_SITE_OTHER): Payer: 59 | Admitting: Gastroenterology

## 2018-02-02 ENCOUNTER — Other Ambulatory Visit: Payer: Self-pay

## 2018-02-02 VITALS — BP 88/54 | HR 76 | Ht 69.5 in | Wt 211.4 lb

## 2018-02-02 DIAGNOSIS — K51311 Ulcerative (chronic) rectosigmoiditis with rectal bleeding: Secondary | ICD-10-CM

## 2018-02-02 DIAGNOSIS — R197 Diarrhea, unspecified: Secondary | ICD-10-CM

## 2018-02-02 LAB — CBC WITH DIFFERENTIAL/PLATELET
Basophils Absolute: 0.1 10*3/uL (ref 0.0–0.1)
Basophils Relative: 1.1 % (ref 0.0–3.0)
EOS ABS: 0.2 10*3/uL (ref 0.0–0.7)
Eosinophils Relative: 2.7 % (ref 0.0–5.0)
HCT: 37.8 % — ABNORMAL LOW (ref 39.0–52.0)
Hemoglobin: 12.6 g/dL — ABNORMAL LOW (ref 13.0–17.0)
Lymphocytes Relative: 18.2 % (ref 12.0–46.0)
Lymphs Abs: 1.6 10*3/uL (ref 0.7–4.0)
MCHC: 33.3 g/dL (ref 30.0–36.0)
MCV: 86.9 fl (ref 78.0–100.0)
MONO ABS: 1.2 10*3/uL — AB (ref 0.1–1.0)
Monocytes Relative: 13.8 % — ABNORMAL HIGH (ref 3.0–12.0)
NEUTROS ABS: 5.5 10*3/uL (ref 1.4–7.7)
NEUTROS PCT: 64.2 % (ref 43.0–77.0)
Platelets: 305 10*3/uL (ref 150.0–400.0)
RBC: 4.35 Mil/uL (ref 4.22–5.81)
RDW: 14.9 % (ref 11.5–15.5)
WBC: 8.6 10*3/uL (ref 4.0–10.5)

## 2018-02-02 LAB — BASIC METABOLIC PANEL
BUN: 28 mg/dL — ABNORMAL HIGH (ref 6–23)
CO2: 24 mEq/L (ref 19–32)
Calcium: 9.5 mg/dL (ref 8.4–10.5)
Chloride: 99 mEq/L (ref 96–112)
Creatinine, Ser: 1.8 mg/dL — ABNORMAL HIGH (ref 0.40–1.50)
GFR: 40.78 mL/min — AB (ref >60.00)
Glucose, Bld: 163 mg/dL — ABNORMAL HIGH (ref 70–99)
POTASSIUM: 4.5 meq/L (ref 3.5–5.1)
Sodium: 134 mEq/L — ABNORMAL LOW (ref 135–145)

## 2018-02-02 LAB — HEPATIC FUNCTION PANEL
ALT: 14 U/L (ref 0–53)
AST: 15 U/L (ref 0–37)
Albumin: 3.8 g/dL (ref 3.5–5.2)
Alkaline Phosphatase: 46 U/L (ref 39–117)
BILIRUBIN TOTAL: 0.6 mg/dL (ref 0.2–1.2)
Bilirubin, Direct: 0 mg/dL (ref 0.0–0.3)
Total Protein: 7.4 g/dL (ref 6.0–8.3)

## 2018-02-02 LAB — TSH: TSH: 1.25 u[IU]/mL (ref 0.35–4.50)

## 2018-02-02 LAB — SEDIMENTATION RATE: Sed Rate: 79 mm/hr — ABNORMAL HIGH (ref 0–20)

## 2018-02-02 LAB — C-REACTIVE PROTEIN: CRP: 11.2 mg/dL (ref 0.5–20.0)

## 2018-02-02 MED ORDER — BUDESONIDE ER 9 MG PO TB24: 1.0000 | ORAL_TABLET | Freq: Every day | ORAL | 1 refills | Status: DC

## 2018-02-02 NOTE — Progress Notes (Signed)
    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  He relates no recent antibiotic usage or travel.  He is having 7-10 bowel movements per day.  No fevers or chills.    Current Medications, Allergies, Past Medical History, Past Surgical History, Family History and Social History were reviewed in [employer] record.  Physical Exam: General: Well developed, well nourished, no acute distress Head: Normocephalic and atraumatic Eyes:  sclerae anicteric, EOMI Ears: Normal auditory acuity Mouth: No deformity or lesions Lungs: Clear throughout to auscultation Heart: Regular rate and rhythm; no murmurs, rubs or bruits Abdomen: Soft, mild lower abdominal tenderness and non distended. No masses, hepatosplenomegaly or hernias noted. Normal Bowel sounds Rectal: Not done Musculoskeletal: Symmetrical with no gross deformities  Pulses:  Normal pulses noted Extremities: No clubbing, cyanosis, edema or deformities noted Neurological: Alert oriented x 4, grossly nonfocal Psychological:  Alert and cooperative. Normal mood and affect  Assessment and Recommendations:  1.  Ulcerative proctosigmoiditis with active flare.  Begin budesonide 9 mg daily.  Continue Lialda 2.4 g twice daily.  Discussed risks, benefits and alternatives to beginning biologic therapy.  He is very interested in gaining better control of his UC.  Blood work ordered.  He is advised to call if his symptoms do not come under better control within a few days and we will start prednisone.  Return office visit in 1 month to initiate therapy.

## 2018-02-02 NOTE — Patient Instructions (Signed)
Your provider has requested that you go to the basement level for lab work before leaving today. Press "B" on the elevator. The lab is located at the first door on the left as you exit the elevator.  We have sent the following medications to your pharmacy for you to pick up at your convenience: Uceris.   Thank you for choosing me and [facility].  Pricilla Riffle. Dagoberto Ligas., MD., [facility]

## 2018-02-03 ENCOUNTER — Telehealth: Payer: Self-pay | Admitting: Gastroenterology

## 2018-02-03 NOTE — Telephone Encounter (Signed)
Pt needs some clarification about his lab results and other labs that he was told he needed. Pls call him

## 2018-02-04 LAB — QUANTIFERON-TB GOLD PLUS
MITOGEN-NIL: 7.89 [IU]/mL
NIL: 0.11 IU/mL
QuantiFERON-TB Gold Plus: NEGATIVE
TB1-NIL: 0 IU/mL
TB2-NIL: 0.01 [IU]/mL

## 2018-02-04 LAB — HEPATITIS B SURFACE ANTIGEN: Hepatitis B Surface Ag: NONREACTIVE

## 2018-02-04 NOTE — Telephone Encounter (Signed)
Patient notified he is due for labs Tomorrow for repeat CBC and BMET

## 2018-02-05 ENCOUNTER — Other Ambulatory Visit (INDEPENDENT_AMBULATORY_CARE_PROVIDER_SITE_OTHER): Payer: 59

## 2018-02-05 DIAGNOSIS — R197 Diarrhea, unspecified: Secondary | ICD-10-CM | POA: Diagnosis not present

## 2018-02-05 DIAGNOSIS — K51311 Ulcerative (chronic) rectosigmoiditis with rectal bleeding: Secondary | ICD-10-CM

## 2018-02-05 LAB — CBC WITH DIFFERENTIAL/PLATELET
BASOS ABS: 0.1 10*3/uL (ref 0.0–0.1)
Basophils Relative: 0.8 % (ref 0.0–3.0)
EOS ABS: 0.1 10*3/uL (ref 0.0–0.7)
Eosinophils Relative: 1.8 % (ref 0.0–5.0)
HCT: 34.1 % — ABNORMAL LOW (ref 39.0–52.0)
HEMOGLOBIN: 11.4 g/dL — AB (ref 13.0–17.0)
LYMPHS ABS: 1.4 10*3/uL (ref 0.7–4.0)
Lymphocytes Relative: 17 % (ref 12.0–46.0)
MCHC: 33.5 g/dL (ref 30.0–36.0)
MCV: 86.2 fl (ref 78.0–100.0)
MONO ABS: 1.3 10*3/uL — AB (ref 0.1–1.0)
Monocytes Relative: 16.3 % — ABNORMAL HIGH (ref 3.0–12.0)
NEUTROS PCT: 64.1 % (ref 43.0–77.0)
Neutro Abs: 5.2 10*3/uL (ref 1.4–7.7)
Platelets: 353 10*3/uL (ref 150.0–400.0)
RBC: 3.95 Mil/uL — AB (ref 4.22–5.81)
RDW: 14.5 % (ref 11.5–15.5)
WBC: 8.1 10*3/uL (ref 4.0–10.5)

## 2018-02-05 LAB — BASIC METABOLIC PANEL
BUN: 24 mg/dL — ABNORMAL HIGH (ref 6–23)
CALCIUM: 9.4 mg/dL (ref 8.4–10.5)
CHLORIDE: 98 meq/L (ref 96–112)
CO2: 26 meq/L (ref 19–32)
Creatinine, Ser: 1.85 mg/dL — ABNORMAL HIGH (ref 0.40–1.50)
GFR: 39.51 mL/min — ABNORMAL LOW (ref >60.00)
Glucose, Bld: 146 mg/dL — ABNORMAL HIGH (ref 70–99)
POTASSIUM: 4.2 meq/L (ref 3.5–5.1)
SODIUM: 132 meq/L — AB (ref 135–145)

## 2018-02-06 ENCOUNTER — Telehealth: Payer: Self-pay

## 2018-02-06 DIAGNOSIS — R7989 Other specified abnormal findings of blood chemistry: Secondary | ICD-10-CM

## 2018-02-06 NOTE — Telephone Encounter (Signed)
Patient notified

## 2018-02-06 NOTE — Telephone Encounter (Signed)
Please be sure he isn't taking any diuretic and if so hold it until we repeat BMET. Continue to push fluids. Won't start a biologic until renal function, electrolytes are stable

## 2018-02-06 NOTE — Telephone Encounter (Signed)
Patient called back to report he is not taking Prinizide.  He will confirm when he gets home that he is not mistaken, push fluids and remain well hydrated and come for labs on Tuesday.

## 2018-02-06 NOTE — Telephone Encounter (Signed)
Patient notified He will come for repeat lab on Tuesday

## 2018-02-06 NOTE — Telephone Encounter (Signed)
-----   Message from [name], MD sent at 02/06/2018  9:15 AM EDT ----- Cr increased slightly on BEMT Hb slightly decreased  Hold Prinizide for 5 days and repeat BMET on Tuesday. If Cr not improving see PCP. Please ask DOD to review BMET and decide about continuing to hold Prinizide.

## 2018-02-10 ENCOUNTER — Other Ambulatory Visit (INDEPENDENT_AMBULATORY_CARE_PROVIDER_SITE_OTHER): Payer: 59

## 2018-02-10 DIAGNOSIS — R7989 Other specified abnormal findings of blood chemistry: Secondary | ICD-10-CM

## 2018-02-10 LAB — BASIC METABOLIC PANEL
BUN: 20 mg/dL (ref 6–23)
CO2: 28 mEq/L (ref 19–32)
CREATININE: 1.55 mg/dL — AB (ref 0.40–1.50)
Calcium: 9.4 mg/dL (ref 8.4–10.5)
Chloride: 102 mEq/L (ref 96–112)
GFR: 48.45 mL/min — ABNORMAL LOW (ref >60.00)
Glucose, Bld: 110 mg/dL — ABNORMAL HIGH (ref 70–99)
Potassium: 4.2 mEq/L (ref 3.5–5.1)
Sodium: 136 mEq/L (ref 135–145)

## 2018-02-10 NOTE — Telephone Encounter (Signed)
Dr. Carlean Purl reviewed labs.  Patient has a history of elevated creatinine going back to at least 2017.  Per Dr. Carlean Purl patient should be directed to follow up with his primary care.   Patient notified of the lab results and recommendations. He has a consult on 02/16/18 10:30 to discuss Entyvio with [facility].

## 2018-02-15 NOTE — Telephone Encounter (Signed)
Known CKD III and Cr has improved closer to recent baseline. This will need ongoing follow up with his PCP.  Continue with plans for Entyvio.

## 2018-02-16 DIAGNOSIS — R197 Diarrhea, unspecified: Secondary | ICD-10-CM | POA: Diagnosis not present

## 2018-02-16 DIAGNOSIS — Z79899 Other long term (current) drug therapy: Secondary | ICD-10-CM | POA: Diagnosis not present

## 2018-02-16 DIAGNOSIS — K5190 Ulcerative colitis, unspecified, without complications: Secondary | ICD-10-CM | POA: Diagnosis not present

## 2018-02-17 ENCOUNTER — Other Ambulatory Visit: Payer: Self-pay | Admitting: Gastroenterology

## 2018-02-19 DIAGNOSIS — K5190 Ulcerative colitis, unspecified, without complications: Secondary | ICD-10-CM | POA: Diagnosis not present

## 2018-02-19 DIAGNOSIS — Z79899 Other long term (current) drug therapy: Secondary | ICD-10-CM | POA: Diagnosis not present

## 2018-02-19 DIAGNOSIS — R5383 Other fatigue: Secondary | ICD-10-CM | POA: Diagnosis not present

## 2018-02-25 ENCOUNTER — Other Ambulatory Visit: Payer: Self-pay | Admitting: Gastroenterology

## 2018-03-02 ENCOUNTER — Encounter: Payer: Self-pay | Admitting: Family Medicine

## 2018-03-02 ENCOUNTER — Ambulatory Visit (INDEPENDENT_AMBULATORY_CARE_PROVIDER_SITE_OTHER): Payer: 59 | Admitting: Family Medicine

## 2018-03-02 VITALS — BP 110/68 | HR 60 | Temp 98.2°F | Resp 14 | Ht 70.0 in | Wt 200.0 lb

## 2018-03-02 DIAGNOSIS — E119 Type 2 diabetes mellitus without complications: Secondary | ICD-10-CM | POA: Diagnosis not present

## 2018-03-02 DIAGNOSIS — E7800 Pure hypercholesterolemia, unspecified: Secondary | ICD-10-CM | POA: Diagnosis not present

## 2018-03-02 DIAGNOSIS — E785 Hyperlipidemia, unspecified: Secondary | ICD-10-CM

## 2018-03-02 DIAGNOSIS — Z125 Encounter for screening for malignant neoplasm of prostate: Secondary | ICD-10-CM | POA: Diagnosis not present

## 2018-03-02 DIAGNOSIS — I10 Essential (primary) hypertension: Secondary | ICD-10-CM | POA: Diagnosis not present

## 2018-03-02 DIAGNOSIS — N183 Chronic kidney disease, stage 3 unspecified: Secondary | ICD-10-CM

## 2018-03-02 MED ORDER — MUPIROCIN CALCIUM 2 % EX CREA: 1.0000 "application " | TOPICAL_CREAM | Freq: Two times a day (BID) | CUTANEOUS | 0 refills | Status: DC

## 2018-03-02 NOTE — Progress Notes (Signed)
Subjective:    Patient ID: Matthew Chen, male    DOB: [birth year], 62 y.o.   MRN: [id number]  Medication Refill     Patient is here today for follow-up.  He states that he is been having more diarrhea recently and is actually having fecal incontinence.  He states that his ulcerative colitis is not well controlled.  Recently his gastroenterologist to switch his medication and he is now started Uceris.  In June and July, the diarrhea was so bad that he became dehydrated.  This is evidenced by his blood work which shows a creatinine of 1.85 which is above his baseline.  His blood pressure is also low secondary to the diarrhea.  He states that his systolic blood pressures typically 90-110/50-60.  At times he feels dizzy and lightheaded.  Also given the worsening kidney function, I question whether Jardiance and metformin is truly a good option for this patient now.  He believes the dehydration has improved because he has made a conscious effort to try to drink more fluid.  The diarrhea is also starting to improve.  He denies any polyuria, polydipsia, or blurry vision.  He denies any chest pain shortness of breath or dyspnea on exertion.  He denies any myalgias or right upper quadrant pain. Wt Readings from Last 3 Encounters:  03/02/18 200 lb (90.7 kg)  02/02/18 211 lb 6 oz (95.9 kg)  09/16/17 206 lb (93.4 kg)   However with the diarrhea, he has lost substantial weight over the last few months.  I believe this is why his blood pressure is low and I believe this is also why his kidney function has worsened. Past Medical History:  Diagnosis Date  . Adenomatous colon polyp 03/2008  . Aortic aneurysm, thoracic ([place]) 09/09/2016   4.4 cm by echo 05/2015  . Arthritis    "joints in my fingers ache" (07/13/2015)  . Bicuspid aortic valve 09/09/2016  . CKD (chronic kidney disease), stage III ([place])   . Clotting disorder ([place])    on plavix for legs  . Diabetes mellitus without complication ([place])   . Hemorrhoids    . Hyperlipidemia   . Hypertension   . Peripheral arterial disease ([place])    a. dopppler 05/29/2015 revealed high-grade right popliteal and distal left SFA disease b. LE angio 06/28/2015 revealing high grade calcific dx with distal L SFA and R popliteal artery s/p diamondback orbital rotational atherectomy, PTA of L SFA  c. 07/13/2015 diamondback orbital rotational atherectomy and drug eluting balloon angioplasty of R popliteal artery (P2 segment) using distal protection   . Type II diabetes mellitus ([place])   . Ulcerative colitis    Past Surgical History:  Procedure Laterality Date  . COLONOSCOPY W/ POLYPECTOMY  X 4-5  . COSMETIC SURGERY  < 2000   "removed birthmark from top of my head"  . PERIPHERAL VASCULAR CATHETERIZATION N/A 06/29/2015   Procedure: Lower Extremity Angiography;  Surgeon: Lorretta Harp, MD;  Location: [place] [facility] [place] [facility];  Service: Cardiovascular;  Laterality: N/A;  . PERIPHERAL VASCULAR CATHETERIZATION N/A 07/13/2015   Procedure: Lower Extremity Angiography;  Surgeon: Lorretta Harp, MD;  Location: [place] [facility];  Service: Cardiovascular;  Laterality: N/A;  . PERIPHERAL VASCULAR CATHETERIZATION  07/13/2015   Procedure: Peripheral Vascular Atherectomy;  Surgeon: Lorretta Harp, MD;  Location: [facility];  Service: Cardiovascular;;  . TONSILLECTOMY     Current Outpatient Medications on File Prior to Visit  Medication Sig Dispense Refill  .  amLODipine (NORVASC) 10 MG tablet TAKE 1 TABLET BY MOUTH EVERY DAY 90 tablet 2  . aspirin EC 81 MG tablet Take 1 tablet (81 mg total) by mouth daily. 90 tablet 3  . atorvastatin (LIPITOR) 40 MG tablet TAKE 1 TABLET BY MOUTH EVERY DAY 30 tablet 11  . blood glucose meter kit and supplies Dispense based on patient and insurance preference. Check BS daily. 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 Needs strips and lancets #100 /5 refills 1 each 0  . canagliflozin (INVOKANA) 300 MG TABS tablet Take 1 tablet (300 mg total) by mouth daily before  breakfast. 90 tablet 1  . carvedilol (COREG) 25 MG tablet TAKE 1 TABLET BY MOUTH TWICE A DAY 60 tablet 11  . dicyclomine (BENTYL) 10 MG capsule TAKE 1 CAPSULE BY MOUTH 4 TIMES DAILY - BEFORE MEALS AND AT BEDTIME. 90 capsule 0  . fenofibrate 160 MG tablet TAKE 1 TABLET BY MOUTH EVERY DAY 90 tablet 1  . Insulin Pen Needle (B-D UF III MINI PEN NEEDLES) 31G X 5 MM MISC USE DAILY WITH VICTOZA 100 each 0  . LIALDA 1.2 g EC tablet TAKE 2 TABLETS BY MOUTH TWICE A DAY 120 tablet 2  . lisinopril-hydrochlorothiazide (PRINZIDE,ZESTORETIC) 20-25 MG tablet TAKE 1 TABLET BY MOUTH EVERY DAY 90 tablet 3  . metFORMIN (GLUCOPHAGE) 1000 MG tablet TAKE 1 TABLET BY MOUTH TWICE A DAY WITH MEALS 180 tablet 1  . sildenafil (VIAGRA) 100 MG tablet Take 0.5-1 tablets (50-100 mg total) daily as needed by mouth for erectile dysfunction. 10 tablet 11  . UCERIS 9 MG TB24 TAKE 1 TABLET BY MOUTH EVERY DAY 30 tablet 1  . VICTOZA 18 MG/3ML SOPN INJECT 0.2 MLS (1.2 MG TOTAL) DAILY INTO THE SKIN. 3 pen 1   No current facility-administered medications on file prior to visit.    Allergies  Allergen Reactions  . Penicillins Other (See Comments)    Unsure, childhood reaction   Social History   Socioeconomic History  . Marital status: Married    Spouse name: Not on file  . Number of children: 2  . Years of education: Not on file  . Highest education level: Not on file  Occupational History  . Occupation: Truck Education administrator: Chief Financial Officer  Social Needs  . Financial resource strain: Not on file  . Food insecurity:    Worry: Not on file    Inability: Not on file  . Transportation needs:    Medical: Not on file    Non-medical: Not on file  Tobacco Use  . Smoking status: Former Smoker    Packs/day: 1.50    Years: 25.00    Pack years: 37.50    Types: Cigarettes    Last attempt to quit: 08/12/1996    Years since quitting: 21.5  . Smokeless tobacco: Never Used  Substance and Sexual Activity  . Alcohol  use: Yes    Alcohol/week: 0.6 oz    Types: 1 Cans of beer per week    Comment: social use  . Drug use: No  . Sexual activity: Yes  Lifestyle  . Physical activity:    Days per week: Not on file    Minutes per session: Not on file  . Stress: Not on file  Relationships  . Social connections:    Talks on phone: Not on file    Gets together: Not on file    Attends religious service: Not on file    Active member of club  or organization: Not on file    Attends meetings of clubs or organizations: Not on file    Relationship status: Not on file  . Intimate partner violence:    Fear of current or ex partner: Not on file    Emotionally abused: Not on file    Physically abused: Not on file    Forced sexual activity: Not on file  Other Topics Concern  . Not on file  Social History Narrative   ** Merged History Encounter **          Review of Systems  All other systems reviewed and are negative.      Objective:   Physical Exam  Constitutional: He appears well-developed and well-nourished.  Neck: No JVD present. No thyromegaly present.  Cardiovascular: Normal rate, regular rhythm and intact distal pulses.  Murmur heard. Pulmonary/Chest: Effort normal and breath sounds normal. No respiratory distress. He has no wheezes. He has no rales.  Abdominal: Soft. Bowel sounds are normal. He exhibits no distension. There is no tenderness. There is no rebound and no guarding.  Musculoskeletal: He exhibits no edema.  Skin: Skin is warm. No rash noted.  Vitals reviewed.         Assessment & Plan:   Benign essential HTN  Pure hypercholesterolemia  Controlled type 2 diabetes mellitus without complication, without long-term current use of insulin ([place]) - Plan: CBC with Differential/Platelet, COMPLETE METABOLIC PANEL WITH GFR, Lipid panel, Microalbumin, urine, Hemoglobin A1c  Dyslipidemia  CKD (chronic kidney disease), stage III ([facility])  Prostate cancer screening - Plan:  PSA Discontinue amlodipine due to the low blood pressure.  Monitor blood pressure closely at home and if greater than 140/90 resume amlodipine.  Check hemoglobin A1c.  If hemoglobin A1c is well controlled, I will likely discontinue Jardiance due to dehydration and chronic kidney disease.  If creatinine clearance is less than 30, we may now also need to discontinue metformin.  Check fasting lipid panel.  Screen for prostate cancer with psa.

## 2018-03-03 ENCOUNTER — Encounter: Payer: Self-pay | Admitting: Physician Assistant

## 2018-03-03 ENCOUNTER — Ambulatory Visit (INDEPENDENT_AMBULATORY_CARE_PROVIDER_SITE_OTHER): Payer: 59 | Admitting: Physician Assistant

## 2018-03-03 VITALS — BP 110/62 | HR 66 | Ht 70.0 in | Wt 203.4 lb

## 2018-03-03 DIAGNOSIS — K51319 Ulcerative (chronic) rectosigmoiditis with unspecified complications: Secondary | ICD-10-CM | POA: Diagnosis not present

## 2018-03-03 LAB — MICROALBUMIN, URINE: Microalb, Ur: 0.2 mg/dL

## 2018-03-03 LAB — PSA: PSA: 2.4 ng/mL (ref <=4.0)

## 2018-03-03 LAB — COMPLETE METABOLIC PANEL WITH GFR
AG RATIO: 1.3 (calc) (ref 1.0–2.5)
ALT: 11 U/L (ref 9–46)
AST: 18 U/L (ref 10–35)
Albumin: 3.7 g/dL (ref 3.6–5.1)
Alkaline phosphatase (APISO): 52 U/L (ref 40–115)
BUN/Creatinine Ratio: 16 (calc) (ref 6–22)
BUN: 26 mg/dL — ABNORMAL HIGH (ref 7–25)
CHLORIDE: 104 mmol/L (ref 98–110)
CO2: 26 mmol/L (ref 20–32)
Calcium: 9.4 mg/dL (ref 8.6–10.3)
Creat: 1.65 mg/dL — ABNORMAL HIGH (ref 0.70–1.25)
GFR, Est African American: 51 mL/min/{1.73_m2} — ABNORMAL LOW (ref >=60)
GFR, Est Non African American: 44 mL/min/{1.73_m2} — ABNORMAL LOW (ref >=60)
Globulin: 2.9 g/dL (calc) (ref 1.9–3.7)
Glucose, Bld: 131 mg/dL — ABNORMAL HIGH (ref 65–99)
POTASSIUM: 4.2 mmol/L (ref 3.5–5.3)
SODIUM: 139 mmol/L (ref 135–146)
Total Bilirubin: 0.4 mg/dL (ref 0.2–1.2)
Total Protein: 6.6 g/dL (ref 6.1–8.1)

## 2018-03-03 LAB — CBC WITH DIFFERENTIAL/PLATELET
Basophils Absolute: 39 cells/uL (ref 0–200)
Basophils Relative: 0.7 %
EOS ABS: 72 {cells}/uL (ref 15–500)
Eosinophils Relative: 1.3 %
HCT: 33.8 % — ABNORMAL LOW (ref 38.5–50.0)
Hemoglobin: 11.1 g/dL — ABNORMAL LOW (ref 13.2–17.1)
Lymphs Abs: 1199 cells/uL (ref 850–3900)
MCH: 28.2 pg (ref 27.0–33.0)
MCHC: 32.8 g/dL (ref 32.0–36.0)
MCV: 85.8 fL (ref 80.0–100.0)
MPV: 9.6 fL (ref 7.5–12.5)
Monocytes Relative: 16.6 %
Neutro Abs: 3278 cells/uL (ref 1500–7800)
Neutrophils Relative %: 59.6 %
Platelets: 393 10*3/uL (ref 140–400)
RBC: 3.94 10*6/uL — ABNORMAL LOW (ref 4.20–5.80)
RDW: 13.7 % (ref 11.0–15.0)
Total Lymphocyte: 21.8 %
WBC: 5.5 10*3/uL (ref 3.8–10.8)
WBCMIX: 913 {cells}/uL (ref 200–950)

## 2018-03-03 LAB — HEMOGLOBIN A1C
Hgb A1c MFr Bld: 7.2 % of total Hgb — ABNORMAL HIGH (ref <5.7)
MEAN PLASMA GLUCOSE: 160 (calc)
eAG (mmol/L): 8.9 (calc)

## 2018-03-03 LAB — LIPID PANEL
CHOLESTEROL: 105 mg/dL (ref <200)
HDL: 33 mg/dL — ABNORMAL LOW (ref >40)
LDL Cholesterol (Calc): 49 mg/dL (calc)
Non-HDL Cholesterol (Calc): 72 mg/dL (calc) (ref <130)
Total CHOL/HDL Ratio: 3.2 (calc) (ref <5.0)
Triglycerides: 143 mg/dL (ref <150)

## 2018-03-03 MED ORDER — MESALAMINE 1.2 G PO TBEC: 2.4000 g | DELAYED_RELEASE_TABLET | Freq: Two times a day (BID) | ORAL | 6 refills | Status: DC

## 2018-03-03 MED ORDER — DICYCLOMINE HCL 10 MG PO CAPS: ORAL_CAPSULE | ORAL | 2 refills | Status: DC

## 2018-03-03 MED ORDER — BUDESONIDE ER 9 MG PO TB24: 1.0000 | ORAL_TABLET | Freq: Every day | ORAL | 6 refills | Status: DC

## 2018-03-03 NOTE — Progress Notes (Signed)
Reviewed and agree with management plan.  Naeema Patlan T. Kaprice Kage, MD FACG 

## 2018-03-03 NOTE — Progress Notes (Signed)
Subjective:    Patient ID: Matthew Chen, male    DOB: 04-02-1956, 62 y.o.   MRN: [id number]  HPI Matthew Chen is a pleasant 62 year old white male, known to Dr. Fuller Plan who comes in today for one-month follow-up with recent exacerbation of ulcerative proctosigmoiditis. He was seen by Dr. Fuller Plan on 02/02/2018 at that time having bloody diarrhea and abdominal cramping with 7-10 bowel movements per day for about a week.  He has been maintained on Lialda 4.8 g daily.  He required a course of budesonide earlier in the year and was restarted on budesonide 9 mg p.o. daily.  Discussions were had regarding starting biologic therapy and patient was agreeable.  QuantiFERON gold was negative, hepatitis B surface antigen negative and he has been vaccinated with Pneumovax. He initiated Entyvio a little over a week ago and did well with his first infusion.  Line Last labs were done on 03/03/2018 with BUN of 26 creatinine 1.65, WBC 5.5, hemoglobin 11.1 hematocrit of 33.8, sed rate of 79 and CRP of 30. Patient says he is definitely improving.  He is seeing significantly less blood in his stools and diarrhea has decreased.  He is now having 4-5 bowel movements per day.  Says his abdominal cramping and pain is improving as well.  He is employed as a Administrator and says his symptoms were so bad previously that he was having to wear depends because of tenesmus and urgency and that has improved.  He has been using Bentyl 10 mg p.o. 3-4 times daily as well.  Review of Systems Pertinent positive and negative review of systems were noted in the above HPI section.  All other review of systems was otherwise negative.  Outpatient Encounter Medications as of 03/03/2018  Medication Sig  . aspirin EC 81 MG tablet Take 1 tablet (81 mg total) by mouth daily.  Marland Kitchen atorvastatin (LIPITOR) 40 MG tablet TAKE 1 TABLET BY MOUTH EVERY DAY  . blood glucose meter kit and supplies Dispense based on patient and insurance preference. Check BS daily. (FOR  ICD-10 - e11.9). Needs strips and lancets #100 /5 refills  . Budesonide ER (UCERIS) 9 MG TB24 Take 1 tablet by mouth daily. Every morning.  . canagliflozin (INVOKANA) 300 MG TABS tablet Take 1 tablet (300 mg total) by mouth daily before breakfast.  . carvedilol (COREG) 25 MG tablet TAKE 1 TABLET BY MOUTH TWICE A DAY  . dicyclomine (BENTYL) 10 MG capsule Take 1 tablet by mouth 3-4 times daily as needed for urgency and spasms.  . fenofibrate 160 MG tablet TAKE 1 TABLET BY MOUTH EVERY DAY  . Insulin Pen Needle (B-D UF III MINI PEN NEEDLES) 31G X 5 MM MISC USE DAILY WITH VICTOZA  . lisinopril-hydrochlorothiazide (PRINZIDE,ZESTORETIC) 20-25 MG tablet TAKE 1 TABLET BY MOUTH EVERY DAY  . mesalamine (LIALDA) 1.2 g EC tablet Take 2 tablets (2.4 g total) by mouth 2 (two) times daily.  . metFORMIN (GLUCOPHAGE) 1000 MG tablet TAKE 1 TABLET BY MOUTH TWICE A DAY WITH MEALS  . mupirocin cream (BACTROBAN) 2 % Apply 1 application topically 2 (two) times daily.  . sildenafil (VIAGRA) 100 MG tablet Take 0.5-1 tablets (50-100 mg total) daily as needed by mouth for erectile dysfunction.  . Vedolizumab (ENTYVIO IV) Inject into the vein.  Marland Kitchen VICTOZA 18 MG/3ML SOPN INJECT 0.2 MLS (1.2 MG TOTAL) DAILY INTO THE SKIN.  . [DISCONTINUED] dicyclomine (BENTYL) 10 MG capsule TAKE 1 CAPSULE BY MOUTH 4 TIMES DAILY - BEFORE MEALS AND AT  BEDTIME.  . [DISCONTINUED] LIALDA 1.2 g EC tablet TAKE 2 TABLETS BY MOUTH TWICE A DAY  . [DISCONTINUED] UCERIS 9 MG TB24 TAKE 1 TABLET BY MOUTH EVERY DAY  . [DISCONTINUED] amLODipine (NORVASC) 10 MG tablet TAKE 1 TABLET BY MOUTH EVERY DAY   No facility-administered encounter medications on file as of 03/03/2018.    Allergies  Allergen Reactions  . Penicillins Other (See Comments)    Unsure, childhood reaction   Patient Active Problem List   Diagnosis Date Noted  . Type II diabetes mellitus ([place])   . Hypertension   . Diabetes mellitus without complication ([place])   . Clotting disorder ([place])     . Arthritis   . Bicuspid aortic valve 09/09/2016  . Aortic aneurysm, thoracic ([place]) 09/09/2016  . Hyperlipidemia   . Type 2 diabetes mellitus with complication, with long-term current use of insulin ([place])   . CKD (chronic kidney disease), stage III ([place])   . Claudication ([place]) 06/29/2015  . Peripheral arterial disease ([place]) 06/28/2015  . Essential hypertension 10/05/2014  . ULCERATIVE COLITIS-UNIVERSAL 07/04/2008  . PERSONAL HX COLONIC POLYPS 07/04/2008  . Adenomatous colon polyp 03/12/2008  . ULCERATIVE PROCTITIS 03/08/2008  . ULCERATIVE PROCTOSIGMOIDITIS 03/08/2008   Social History   Socioeconomic History  . Marital status: Married    Spouse name: Not on file  . Number of children: 2  . Years of education: Not on file  . Highest education level: Not on file  Occupational History  . Occupation: Truck Education administrator: Chief Financial Officer  Social Needs  . Financial resource strain: Not on file  . Food insecurity:    Worry: Not on file    Inability: Not on file  . Transportation needs:    Medical: Not on file    Non-medical: Not on file  Tobacco Use  . Smoking status: Former Smoker    Packs/day: 1.50    Years: 25.00    Pack years: 37.50    Types: Cigarettes    Last attempt to quit: 08/12/1996    Years since quitting: 21.5  . Smokeless tobacco: Never Used  Substance and Sexual Activity  . Alcohol use: Yes    Alcohol/week: 0.6 oz    Types: 1 Cans of beer per week    Comment: social use  . Drug use: No  . Sexual activity: Yes  Lifestyle  . Physical activity:    Days per week: Not on file    Minutes per session: Not on file  . Stress: Not on file  Relationships  . Social connections:    Talks on phone: Not on file    Gets together: Not on file    Attends religious service: Not on file    Active member of club or organization: Not on file    Attends meetings of clubs or organizations: Not on file    Relationship status: Not on file  . Intimate  partner violence:    Fear of current or ex partner: Not on file    Emotionally abused: Not on file    Physically abused: Not on file    Forced sexual activity: Not on file  Other Topics Concern  . Not on file  Social History Narrative   ** Merged History Encounter **        Matthew Chen's family history includes Colon cancer in his maternal grandmother; Colon polyps in his mother; Diabetes in his maternal aunt and mother; Heart disease in his father.  Objective:    Vitals:   03/03/18 0903  BP: 110/62  Pulse: 66    Physical Exam; well-developed white male in no acute distress, pleasant blood pressure 110/62 pulse 62, height 5 foot 10, weight 203, BMI 29.1.  HEENT; nontraumatic normocephalic EOMI PERRLA sclera anicteric oropharynx benign, Cardiovascular; regular rate and rhythm with S1-S2 no murmur rub or gallop, Pulmonary; clear bilaterally, Abdomen; soft, bowel sounds are active, there is no focal tenderness no guarding or rebound no palpable mass or hepatosplenomegaly.  Rectal ;exam not done.  Extremities; no clubbing cyanosis or edema skin warm and dry, Neuro psych; alert and oriented, grossly nonfocal mood and affect appropriate       Assessment & Plan:   #71 62 year old white male with ulcerative proctosigmoiditis with recent exacerbation. He is improving over the past couple of weeks after initiation of budesonide 9 mg p.o. daily. Patient has also just received his first Entyvio infusion 300 mg IV and tolerated that well.  Plan; continue Lialda 1.2 g 4 tablets daily Continue budesonide 9 mg p.o. daily very Explained to patient today that he may need a 10 to 12-week course and then will wean. Continue Entyvio 300 mg IV.  Patient is currently undergoing induction and receiving his infusions through [facility]. Continue Bentyl 10 mg 3-4 times daily as needed for cramping and spasm. Patient will need office follow-up with Dr. Fuller Plan in about 2 months.  He was made an  appointment today and have ordered labs to be done the week prior to that appointment to include CBC, C met sed rate and CRP. Patient is aware he should call for any problems in the interim and I am happy to see him in the interim as needed.  Amy Genia Harold [name] 03/03/2018   Cc: Susy Frizzle, MD

## 2018-03-03 NOTE — Patient Instructions (Signed)
Continue Lialda 1.2 g ; 4 tablets daily.  Continue Uceris 9 mg, take 1 tab 9 mg daily. Continue Bentyl ( dicyclomine) 10 mg  Take 3-4 times daily as needed for urgency and spasms.   Come to our lab, basement level either Thursday  9-19 or Friday 9-20.  Follow up with Dr. Fuller Plan on Monday 05-04-2018 at 2:45 PM.   Call us for problems between now and that appointment in September.     If you are age 62 or younger, your body mass index should be between 19-25. Your Body mass index is 29.18 kg/m. If this is out of the aformentioned range listed, please consider follow up with your Primary Care Provider.

## 2018-03-04 ENCOUNTER — Ambulatory Visit: Payer: 59 | Admitting: Physician Assistant

## 2018-03-05 ENCOUNTER — Other Ambulatory Visit: Payer: Self-pay | Admitting: Family Medicine

## 2018-03-05 DIAGNOSIS — K5190 Ulcerative colitis, unspecified, without complications: Secondary | ICD-10-CM | POA: Diagnosis not present

## 2018-03-05 MED ORDER — SITAGLIPTIN PHOSPHATE 100 MG PO TABS: 100.0000 mg | ORAL_TABLET | Freq: Every day | ORAL | 1 refills | Status: DC

## 2018-03-05 NOTE — Addendum Note (Signed)
Addended by: Shary Decamp B on: 03/05/2018 12:34 PM   Modules accepted: Orders

## 2018-03-09 ENCOUNTER — Ambulatory Visit: Payer: 59 | Admitting: Family Medicine

## 2018-03-10 ENCOUNTER — Telehealth: Payer: Self-pay | Admitting: Family Medicine

## 2018-03-10 ENCOUNTER — Ambulatory Visit: Payer: 59 | Admitting: Family Medicine

## 2018-03-10 MED ORDER — LINAGLIPTIN 5 MG PO TABS: 5.0000 mg | ORAL_TABLET | Freq: Every day | ORAL | 1 refills | Status: DC

## 2018-03-10 NOTE — Telephone Encounter (Signed)
Med sent to pharm and pt aware

## 2018-03-10 NOTE — Telephone Encounter (Signed)
Requiring PA for Januvia - can we switch to [place]?

## 2018-03-10 NOTE — Telephone Encounter (Signed)
Yes 5 mg a day

## 2018-03-16 ENCOUNTER — Other Ambulatory Visit: Payer: Self-pay | Admitting: Cardiovascular Disease

## 2018-03-16 DIAGNOSIS — Z9862 Peripheral vascular angioplasty status: Secondary | ICD-10-CM

## 2018-03-16 DIAGNOSIS — I739 Peripheral vascular disease, unspecified: Secondary | ICD-10-CM

## 2018-03-17 ENCOUNTER — Ambulatory Visit (HOSPITAL_COMMUNITY)
Admission: RE | Admit: 2018-03-17 | Discharge: 2018-03-17 | Disposition: A | Discharge status: Home / Self Care | Payer: 59 | Source: Ambulatory Visit | Attending: Cardiology | Admitting: Cardiology

## 2018-03-17 DIAGNOSIS — I739 Peripheral vascular disease, unspecified: Secondary | ICD-10-CM

## 2018-03-17 DIAGNOSIS — Z9862 Peripheral vascular angioplasty status: Secondary | ICD-10-CM | POA: Diagnosis not present

## 2018-03-22 ENCOUNTER — Other Ambulatory Visit: Payer: Self-pay | Admitting: Family Medicine

## 2018-03-31 ENCOUNTER — Ambulatory Visit: Payer: 59 | Admitting: Gastroenterology

## 2018-04-02 DIAGNOSIS — K5190 Ulcerative colitis, unspecified, without complications: Secondary | ICD-10-CM | POA: Diagnosis not present

## 2018-04-05 ENCOUNTER — Other Ambulatory Visit: Payer: Self-pay | Admitting: Family Medicine

## 2018-04-07 ENCOUNTER — Ambulatory Visit (INDEPENDENT_AMBULATORY_CARE_PROVIDER_SITE_OTHER): Payer: 59 | Admitting: Cardiovascular Disease

## 2018-04-07 ENCOUNTER — Encounter: Payer: Self-pay | Admitting: Cardiovascular Disease

## 2018-04-07 DIAGNOSIS — I10 Essential (primary) hypertension: Secondary | ICD-10-CM

## 2018-04-07 DIAGNOSIS — I739 Peripheral vascular disease, unspecified: Secondary | ICD-10-CM | POA: Diagnosis not present

## 2018-04-07 DIAGNOSIS — Q231 Congenital insufficiency of aortic valve: Secondary | ICD-10-CM | POA: Diagnosis not present

## 2018-04-07 DIAGNOSIS — E7800 Pure hypercholesterolemia, unspecified: Secondary | ICD-10-CM | POA: Diagnosis not present

## 2018-04-07 NOTE — Assessment & Plan Note (Signed)
History of essential hypertension her blood pressure measured today at 136/76.  He is on carvedilol, lisinopril and hydrochlorothiazide.  Continue current meds at current dosing.

## 2018-04-07 NOTE — Assessment & Plan Note (Signed)
History of hyperlipidemia on statin therapy with lipid profile performed 03/02/2018 revealing total cholesterol 105 and HDL 33.

## 2018-04-07 NOTE — Progress Notes (Signed)
04/07/2018 Matthew Chen   08-09-1956  [id number]  Primary Physician Pickard, Cammie Mcgee, MD Primary Cardiologist: Lorretta Harp MD Garret Reddish, [place], [place]  HPI:  Matthew Chen is a 62 y.o.  mild to moderately overweight married Caucasian male father of 4 children, grandfather of a grandchildren was referred by Dr. Skeet Latch for evaluation and treatment of claudication. I last saw him in the office  09/16/2017.He has a history of treated hypertension, diabetes and hyperlipidemia. 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 He's never had a heart attack or stroke and denies chest pain or shortness of breath. He does complain of lifestyle limiting claudication and had arterial Doppler studies performed in our office 05/29/15 revealing high-grade right popliteal and distal left SFA disease. Based on this, we decided to proceed with angiography and potential percutaneous revascularization. I initially angiogrammed him on 06/29/15 and performed diamondback orbital rotational atherectomy, drug eluting balloon angioplasty of the distal left SFA for a high-grade, subocclusive calcific/exophytic plaque. He had a high-grade calcific/exophytic subocclusive right popliteal artery plaque for which he underwent diamondback orbital rotation arthrectomy, drug eluding balloon angioplasty on 07/13/15 with an excellent angiographic result. Since that time his claudication has resolved. His Dopplers performed  03/17/2018 revealed normal ABIs bilaterally with the development of a high-frequency signal in his right popliteal artery.  Since I saw him 8 months ago he has developed lifestyle limiting right calf claudication.   Current Meds  Medication Sig  . aspirin EC 81 MG tablet Take 1 tablet (81 mg total) by mouth daily.  Marland Kitchen atorvastatin (LIPITOR) 40 MG tablet TAKE 1 TABLET BY MOUTH EVERY DAY  . blood glucose meter kit and supplies Dispense based on patient and insurance  preference. Check BS daily. 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 Needs strips and lancets #100 /5 refills  . Budesonide ER (UCERIS) 9 MG TB24 Take 1 tablet by mouth daily. Every morning.  . carvedilol (COREG) 25 MG tablet TAKE 1 TABLET BY MOUTH TWICE A DAY  . dicyclomine (BENTYL) 10 MG capsule Take 1 tablet by mouth 3-4 times daily as needed for urgency and spasms.  . fenofibrate 160 MG tablet TAKE 1 TABLET BY MOUTH EVERY DAY  . Insulin Pen Needle (B-D UF III MINI PEN NEEDLES) 31G X 5 MM MISC USE DAILY WITH VICTOZA  . linagliptin (TRADJENTA) 5 MG TABS tablet Take 1 tablet (5 mg total) by mouth daily.  Marland Kitchen lisinopril-hydrochlorothiazide (PRINZIDE,ZESTORETIC) 20-25 MG tablet TAKE 1 TABLET BY MOUTH EVERY DAY  . mesalamine (LIALDA) 1.2 g EC tablet Take 2 tablets (2.4 g total) by mouth 2 (two) times daily.  . metFORMIN (GLUCOPHAGE) 1000 MG tablet TAKE 1 TABLET BY MOUTH TWICE A DAY WITH MEALS  . mupirocin cream (BACTROBAN) 2 % Apply 1 application topically 2 (two) times daily.  . sildenafil (VIAGRA) 100 MG tablet Take 0.5-1 tablets (50-100 mg total) daily as needed by mouth for erectile dysfunction.  . Vedolizumab (ENTYVIO IV) Inject into the vein.  Marland Kitchen VICTOZA 18 MG/3ML SOPN INJECT 0.2 MLS (1.2 MG TOTAL) DAILY INTO THE SKIN.  Marland Kitchen VICTOZA 18 MG/3ML SOPN INJECT 0.2 MLS (1.2 MG TOTAL) DAILY INTO THE SKIN.     Allergies  Allergen Reactions  . Penicillins Other (See Comments)    Unsure, childhood reaction    Social History   Socioeconomic History  . Marital status: Married    Spouse name: Not on file  . Number of  children: 2  . Years of education: Not on file  . Highest education level: Not on file  Occupational History  . Occupation: Truck Education administrator: Chief Financial Officer  Social Needs  . Financial resource strain: Not on file  . Food insecurity:    Worry: Not on file    Inability: Not on file  . Transportation needs:    Medical: Not on file    Non-medical: Not on file  Tobacco Use  .  Smoking status: Former Smoker    Packs/day: 1.50    Years: 25.00    Pack years: 37.50    Types: Cigarettes    Last attempt to quit: 08/12/1996    Years since quitting: 21.6  . Smokeless tobacco: Never Used  Substance and Sexual Activity  . Alcohol use: Yes    Alcohol/week: 1.0 standard drinks    Types: 1 Cans of beer per week    Comment: social use  . Drug use: No  . Sexual activity: Yes  Lifestyle  . Physical activity:    Days per week: Not on file    Minutes per session: Not on file  . Stress: Not on file  Relationships  . Social connections:    Talks on phone: Not on file    Gets together: Not on file    Attends religious service: Not on file    Active member of club or organization: Not on file    Attends meetings of clubs or organizations: Not on file    Relationship status: Not on file  . Intimate partner violence:    Fear of current or ex partner: Not on file    Emotionally abused: Not on file    Physically abused: Not on file    Forced sexual activity: Not on file  Other Topics Concern  . Not on file  Social History Narrative   ** Merged History Encounter **         Review of Systems: General: negative for chills, fever, night sweats or weight changes.  Cardiovascular: negative for chest pain, dyspnea on exertion, edema, orthopnea, palpitations, paroxysmal nocturnal dyspnea or shortness of breath Dermatological: negative for rash Respiratory: negative for cough or wheezing Urologic: negative for hematuria Abdominal: negative for nausea, vomiting, diarrhea, bright red blood per rectum, melena, or hematemesis Neurologic: negative for visual changes, syncope, or dizziness All other systems reviewed and are otherwise negative except as noted above.    Blood pressure 136/76, pulse 63, height 5' 10"  (1.778 m), weight 207 lb (93.9 kg).  General appearance: alert and no distress Neck: no adenopathy, no carotid bruit, no JVD, supple, symmetrical, trachea midline and  thyroid not enlarged, symmetric, no tenderness/mass/nodules Lungs: clear to auscultation bilaterally Heart: Soft outflow tract murmur Extremities: extremities normal, atraumatic, no cyanosis or edema Pulses: 2+ and symmetric Skin: Skin color, texture, turgor normal. No rashes or lesions Neurologic: Alert and oriented X 3, normal strength and tone. Normal symmetric reflexes. Normal coordination and gait  EKG not performed today  ASSESSMENT AND PLAN:   Essential hypertension History of essential hypertension her blood pressure measured today at 136/76.  He is on carvedilol, lisinopril and hydrochlorothiazide.  Continue current meds at current dosing.  Peripheral arterial disease ([place]) History of peripheral arterial disease status post right and left popliteal diamondback atherectomy in November and December 2016.  His immediate postop Dopplers were completely normal and his claudication resolved.  His most recent Dopplers performed 03/17/2018 revealed a high-frequency signal in  his right popliteal artery and he has had recurrent claudication.  We will plan on proceeding with angiography and diamondback atherectomy for lifestyle limiting claudication.  Hyperlipidemia History of hyperlipidemia on statin therapy with lipid profile performed 03/02/2018 revealing total cholesterol 105 and HDL 33.  Bicuspid aortic valve History of bicuspid aortic valve with echo performed 10/07/2016 that showed normal LV function with no evidence of aortic stenosis.  Aortic aneurysm, thoracic ([facility]) There was mild ascending thoracic dilatation by echo 10/16 measuring 4.4 cm.  We will recheck a 2D echocardiogram.  Hypertension History of essential hypertension her blood pressure measured today at 136/76.  He is on carvedilol, lisinopril and hydrochlorothiazide.      Lorretta Harp MD FACP,FACC,FAHA, [facility] 04/07/2018 3:38 PM

## 2018-04-07 NOTE — Assessment & Plan Note (Signed)
History of essential hypertension her blood pressure measured today at 136/76.  He is on carvedilol, lisinopril and hydrochlorothiazide.

## 2018-04-07 NOTE — Assessment & Plan Note (Signed)
There was mild ascending thoracic dilatation by echo 10/16 measuring 4.4 cm.  We will recheck a 2D echocardiogram.

## 2018-04-07 NOTE — H&P (View-Only) (Signed)
04/07/2018 Matthew Chen   08/03/1956  [id number]  Primary Physician Pickard, Cammie Mcgee, MD Primary Cardiologist: Lorretta Harp MD Garret Reddish, [place], [place]  HPI:  Matthew Chen is a 62 y.o.  mild to moderately overweight married Caucasian male father of 4 children, grandfather of a grandchildren was referred by Dr. Skeet Latch for evaluation and treatment of claudication. I last saw him in the office  09/16/2017.He has a history of treated hypertension, diabetes and hyperlipidemia. 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 He's never had a heart attack or stroke and denies chest pain or shortness of breath. He does complain of lifestyle limiting claudication and had arterial Doppler studies performed in our office 05/29/15 revealing high-grade right popliteal and distal left SFA disease. Based on this, we decided to proceed with angiography and potential percutaneous revascularization. I initially angiogrammed him on 06/29/15 and performed diamondback orbital rotational atherectomy, drug eluting balloon angioplasty of the distal left SFA for a high-grade, subocclusive calcific/exophytic plaque. He had a high-grade calcific/exophytic subocclusive right popliteal artery plaque for which he underwent diamondback orbital rotation arthrectomy, drug eluding balloon angioplasty on 07/13/15 with an excellent angiographic result. Since that time his claudication has resolved. His Dopplers performed  03/17/2018 revealed normal ABIs bilaterally with the development of a high-frequency signal in his right popliteal artery.  Since I saw him 8 months ago he has developed lifestyle limiting right calf claudication.   Current Meds  Medication Sig  . aspirin EC 81 MG tablet Take 1 tablet (81 mg total) by mouth daily.  Marland Kitchen atorvastatin (LIPITOR) 40 MG tablet TAKE 1 TABLET BY MOUTH EVERY DAY  . blood glucose meter kit and supplies Dispense based on patient and insurance  preference. Check BS daily. 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 Needs strips and lancets #100 /5 refills  . Budesonide ER (UCERIS) 9 MG TB24 Take 1 tablet by mouth daily. Every morning.  . carvedilol (COREG) 25 MG tablet TAKE 1 TABLET BY MOUTH TWICE A DAY  . dicyclomine (BENTYL) 10 MG capsule Take 1 tablet by mouth 3-4 times daily as needed for urgency and spasms.  . fenofibrate 160 MG tablet TAKE 1 TABLET BY MOUTH EVERY DAY  . Insulin Pen Needle (B-D UF III MINI PEN NEEDLES) 31G X 5 MM MISC USE DAILY WITH VICTOZA  . linagliptin (TRADJENTA) 5 MG TABS tablet Take 1 tablet (5 mg total) by mouth daily.  Marland Kitchen lisinopril-hydrochlorothiazide (PRINZIDE,ZESTORETIC) 20-25 MG tablet TAKE 1 TABLET BY MOUTH EVERY DAY  . mesalamine (LIALDA) 1.2 g EC tablet Take 2 tablets (2.4 g total) by mouth 2 (two) times daily.  . metFORMIN (GLUCOPHAGE) 1000 MG tablet TAKE 1 TABLET BY MOUTH TWICE A DAY WITH MEALS  . mupirocin cream (BACTROBAN) 2 % Apply 1 application topically 2 (two) times daily.  . sildenafil (VIAGRA) 100 MG tablet Take 0.5-1 tablets (50-100 mg total) daily as needed by mouth for erectile dysfunction.  . Vedolizumab (ENTYVIO IV) Inject into the vein.  Marland Kitchen VICTOZA 18 MG/3ML SOPN INJECT 0.2 MLS (1.2 MG TOTAL) DAILY INTO THE SKIN.  Marland Kitchen VICTOZA 18 MG/3ML SOPN INJECT 0.2 MLS (1.2 MG TOTAL) DAILY INTO THE SKIN.     Allergies  Allergen Reactions  . Penicillins Other (See Comments)    Unsure, childhood reaction    Social History   Socioeconomic History  . Marital status: Married    Spouse name: Not on file  . Number of  children: 2  . Years of education: Not on file  . Highest education level: Not on file  Occupational History  . Occupation: Truck Education administrator: [employer]  Social Needs  . Financial resource strain: Not on file  . Food insecurity:    Worry: Not on file    Inability: Not on file  . Transportation needs:    Medical: Not on file    Non-medical: Not on file  Tobacco Use  .  Smoking status: Former Smoker    Packs/day: 1.50    Years: 25.00    Pack years: 37.50    Types: Cigarettes    Last attempt to quit: 08/12/1996    Years since quitting: 21.6  . Smokeless tobacco: Never Used  Substance and Sexual Activity  . Alcohol use: Yes    Alcohol/week: 1.0 standard drinks    Types: 1 Cans of beer per week    Comment: social use  . Drug use: No  . Sexual activity: Yes  Lifestyle  . Physical activity:    Days per week: Not on file    Minutes per session: Not on file  . Stress: Not on file  Relationships  . Social connections:    Talks on phone: Not on file    Gets together: Not on file    Attends religious service: Not on file    Active member of club or organization: Not on file    Attends meetings of clubs or organizations: Not on file    Relationship status: Not on file  . Intimate partner violence:    Fear of current or ex partner: Not on file    Emotionally abused: Not on file    Physically abused: Not on file    Forced sexual activity: Not on file  Other Topics Concern  . Not on file  Social History Narrative   ** Merged History Encounter **         Review of Systems: General: negative for chills, fever, night sweats or weight changes.  Cardiovascular: negative for chest pain, dyspnea on exertion, edema, orthopnea, palpitations, paroxysmal nocturnal dyspnea or shortness of breath Dermatological: negative for rash Respiratory: negative for cough or wheezing Urologic: negative for hematuria Abdominal: negative for nausea, vomiting, diarrhea, bright red blood per rectum, melena, or hematemesis Neurologic: negative for visual changes, syncope, or dizziness All other systems reviewed and are otherwise negative except as noted above.    Blood pressure 136/76, pulse 63, height 5' 10"  (1.778 m), weight 207 lb (93.9 kg).  General appearance: alert and no distress Neck: no adenopathy, no carotid bruit, no JVD, supple, symmetrical, trachea midline and  thyroid not enlarged, symmetric, no tenderness/mass/nodules Lungs: clear to auscultation bilaterally Heart: Soft outflow tract murmur Extremities: extremities normal, atraumatic, no cyanosis or edema Pulses: 2+ and symmetric Skin: Skin color, texture, turgor normal. No rashes or lesions Neurologic: Alert and oriented X 3, normal strength and tone. Normal symmetric reflexes. Normal coordination and gait  EKG not performed today  ASSESSMENT AND PLAN:   Essential hypertension History of essential hypertension her blood pressure measured today at 136/76.  He is on carvedilol, lisinopril and hydrochlorothiazide.  Continue current meds at current dosing.  Peripheral arterial disease ([place]) History of peripheral arterial disease status post right and left popliteal diamondback atherectomy in November and December 2016.  His immediate postop Dopplers were completely normal and his claudication resolved.  His most recent Dopplers performed 03/17/2018 revealed a high-frequency signal in  his right popliteal artery and he has had recurrent claudication.  We will plan on proceeding with angiography and diamondback atherectomy for lifestyle limiting claudication.  Hyperlipidemia History of hyperlipidemia on statin therapy with lipid profile performed 03/02/2018 revealing total cholesterol 105 and HDL 33.  Bicuspid aortic valve History of bicuspid aortic valve with echo performed 10/07/2016 that showed normal LV function with no evidence of aortic stenosis.  Aortic aneurysm, thoracic (HCC) There was mild ascending thoracic dilatation by echo 10/16 measuring 4.4 cm.  We will recheck a 2D echocardiogram.  Hypertension History of essential hypertension her blood pressure measured today at 136/76.  He is on carvedilol, lisinopril and hydrochlorothiazide.      Lorretta Harp MD FACP,FACC,FAHA, [facility] 04/07/2018 3:38 PM

## 2018-04-07 NOTE — Assessment & Plan Note (Signed)
History of peripheral arterial disease status post right and left popliteal diamondback atherectomy in November and December 2016.  His immediate postop Dopplers were completely normal and his claudication resolved.  His most recent Dopplers performed 03/17/2018 revealed a high-frequency signal in his right popliteal artery and he has had recurrent claudication.  We will plan on proceeding with angiography and diamondback atherectomy for lifestyle limiting claudication.

## 2018-04-07 NOTE — Assessment & Plan Note (Signed)
History of bicuspid aortic valve with echo performed 10/07/2016 that showed normal LV function with no evidence of aortic stenosis.

## 2018-04-07 NOTE — Patient Instructions (Signed)
    [place] [address] Dept: [phone] Loc: [phone]  Matthew Chen  04/07/2018  You are scheduled for a Peripheral Angiogram on Monday, September 16 with Dr. Quay Burow.  1. Please arrive at the [facility] (Main Entrance A) at [facility]: [address] [place], [place] [address] at 5:30 AM (This time is two hours before your procedure to ensure your preparation). Free valet parking service is available.   Special note: Every effort is made to have your procedure done on time. Please understand that emergencies sometimes delay scheduled procedures.  2. Diet: Do not eat solid foods after midnight.  The patient may have clear liquids until 5am upon the day of the procedure.  3. Labs: You will need to have blood drawn on  TODAY  4. Medication instructions in preparation for your procedure:  DO NOT TAKE METFORMIN Monday-Tuesday OR Wednesday= RESTART Thursday  DO NOT TAKE VICTOZA Monday MORNING  On the morning of your procedure, take your Aspirin and any morning medicines NOT listed above.  You may use sips of water.  5. Plan for one night stay--bring personal belongings. 6. Bring a current list of your medications and current insurance cards. 7. You MUST have a responsible person to drive you home. 8. Someone MUST be with you the first 24 hours after you arrive home or your discharge will be delayed. 9. Please wear clothes that are easy to get on and off and wear slip-on shoes.  Thank you for allowing [place] to care for you!   -- [facility]   Your physician has requested that you have an echocardiogram. Echocardiography is a painless test that uses sound waves to create images of your heart. It provides your doctor with information about the size and shape of your heart and how well your heart's chambers and valves are  working. This procedure takes approximately one hour. There are no restrictions for this procedure.   DOPPLERS ONE WEEK POST PROCEDURE  Your physician recommends that you schedule a follow-up appointment in: 2-3 WEEKS POST PROCEDURE WITH DR Gwenlyn Found

## 2018-04-08 LAB — BASIC METABOLIC PANEL
BUN/Creatinine Ratio: 15 (ref 10–24)
BUN: 23 mg/dL (ref 8–27)
CO2: 27 mmol/L (ref 20–29)
Calcium: 9.8 mg/dL (ref 8.6–10.2)
Chloride: 102 mmol/L (ref 96–106)
Creatinine, Ser: 1.5 mg/dL — ABNORMAL HIGH (ref 0.76–1.27)
GFR calc Af Amer: 57 mL/min/{1.73_m2} — ABNORMAL LOW (ref >59)
GFR, EST NON AFRICAN AMERICAN: 49 mL/min/{1.73_m2} — AB (ref >59)
GLUCOSE: 128 mg/dL — AB (ref 65–99)
POTASSIUM: 3.9 mmol/L (ref 3.5–5.2)
SODIUM: 145 mmol/L — AB (ref 134–144)

## 2018-04-08 LAB — CBC
HEMATOCRIT: 36.7 % — AB (ref 37.5–51.0)
HEMOGLOBIN: 11.9 g/dL — AB (ref 13.0–17.7)
MCH: 28.1 pg (ref 26.6–33.0)
MCHC: 32.4 g/dL (ref 31.5–35.7)
MCV: 87 fL (ref 79–97)
Platelets: 289 10*3/uL (ref 150–450)
RBC: 4.23 x10E6/uL (ref 4.14–5.80)
RDW: 14.7 % (ref 12.3–15.4)
WBC: 6.9 10*3/uL (ref 3.4–10.8)

## 2018-04-14 ENCOUNTER — Other Ambulatory Visit: Payer: Self-pay | Admitting: *Deleted

## 2018-04-14 DIAGNOSIS — I739 Peripheral vascular disease, unspecified: Secondary | ICD-10-CM

## 2018-04-20 ENCOUNTER — Other Ambulatory Visit: Payer: Self-pay | Admitting: Cardiovascular Disease

## 2018-04-20 ENCOUNTER — Ambulatory Visit (HOSPITAL_COMMUNITY): Payer: 59 | Attending: Cardiology

## 2018-04-20 ENCOUNTER — Other Ambulatory Visit: Payer: Self-pay

## 2018-04-20 DIAGNOSIS — E119 Type 2 diabetes mellitus without complications: Secondary | ICD-10-CM | POA: Insufficient documentation

## 2018-04-20 DIAGNOSIS — I77810 Thoracic aortic ectasia: Secondary | ICD-10-CM | POA: Insufficient documentation

## 2018-04-20 DIAGNOSIS — I739 Peripheral vascular disease, unspecified: Secondary | ICD-10-CM

## 2018-04-20 DIAGNOSIS — E785 Hyperlipidemia, unspecified: Secondary | ICD-10-CM | POA: Diagnosis not present

## 2018-04-20 DIAGNOSIS — Q231 Congenital insufficiency of aortic valve: Secondary | ICD-10-CM | POA: Insufficient documentation

## 2018-04-20 DIAGNOSIS — I10 Essential (primary) hypertension: Secondary | ICD-10-CM | POA: Insufficient documentation

## 2018-04-21 DIAGNOSIS — Z23 Encounter for immunization: Secondary | ICD-10-CM | POA: Diagnosis not present

## 2018-04-22 DIAGNOSIS — E119 Type 2 diabetes mellitus without complications: Secondary | ICD-10-CM | POA: Diagnosis not present

## 2018-04-22 DIAGNOSIS — H25813 Combined forms of age-related cataract, bilateral: Secondary | ICD-10-CM | POA: Diagnosis not present

## 2018-04-22 LAB — HM DIABETES EYE EXAM

## 2018-04-23 ENCOUNTER — Other Ambulatory Visit: Payer: Self-pay | Admitting: *Deleted

## 2018-04-23 ENCOUNTER — Other Ambulatory Visit: Payer: Self-pay | Admitting: Family Medicine

## 2018-04-23 DIAGNOSIS — Q231 Congenital insufficiency of aortic valve: Secondary | ICD-10-CM

## 2018-04-23 MED ORDER — GLUCOSE BLOOD VI STRP: ORAL_STRIP | 3 refills | Status: DC

## 2018-04-27 ENCOUNTER — Ambulatory Visit (HOSPITAL_COMMUNITY)
Admission: RE | Admit: 2018-04-27 | Discharge: 2018-04-28 | Disposition: A | Discharge status: Home / Self Care | Payer: 59 | Source: Ambulatory Visit | Attending: Cardiovascular Disease | Admitting: Cardiovascular Disease

## 2018-04-27 ENCOUNTER — Other Ambulatory Visit: Payer: Self-pay

## 2018-04-27 ENCOUNTER — Other Ambulatory Visit: Payer: Self-pay | Admitting: *Deleted

## 2018-04-27 ENCOUNTER — Encounter (HOSPITAL_COMMUNITY): Payer: Self-pay | Admitting: Cardiovascular Disease

## 2018-04-27 ENCOUNTER — Encounter (HOSPITAL_COMMUNITY)
Admission: RE | Disposition: A | Discharge status: Home / Self Care | Payer: Self-pay | Source: Ambulatory Visit | Attending: Cardiovascular Disease

## 2018-04-27 DIAGNOSIS — Z6829 Body mass index (BMI) 29.0-29.9, adult: Secondary | ICD-10-CM | POA: Diagnosis not present

## 2018-04-27 DIAGNOSIS — N189 Chronic kidney disease, unspecified: Secondary | ICD-10-CM | POA: Insufficient documentation

## 2018-04-27 DIAGNOSIS — E1122 Type 2 diabetes mellitus with diabetic chronic kidney disease: Secondary | ICD-10-CM | POA: Insufficient documentation

## 2018-04-27 DIAGNOSIS — I739 Peripheral vascular disease, unspecified: Secondary | ICD-10-CM | POA: Diagnosis not present

## 2018-04-27 DIAGNOSIS — Z880 Allergy status to penicillin: Secondary | ICD-10-CM | POA: Diagnosis not present

## 2018-04-27 DIAGNOSIS — E1151 Type 2 diabetes mellitus with diabetic peripheral angiopathy without gangrene: Secondary | ICD-10-CM | POA: Insufficient documentation

## 2018-04-27 DIAGNOSIS — E663 Overweight: Secondary | ICD-10-CM | POA: Insufficient documentation

## 2018-04-27 DIAGNOSIS — Z7982 Long term (current) use of aspirin: Secondary | ICD-10-CM | POA: Insufficient documentation

## 2018-04-27 DIAGNOSIS — I10 Essential (primary) hypertension: Secondary | ICD-10-CM

## 2018-04-27 DIAGNOSIS — Z87891 Personal history of nicotine dependence: Secondary | ICD-10-CM

## 2018-04-27 DIAGNOSIS — Z794 Long term (current) use of insulin: Secondary | ICD-10-CM | POA: Diagnosis not present

## 2018-04-27 DIAGNOSIS — I129 Hypertensive chronic kidney disease with stage 1 through stage 4 chronic kidney disease, or unspecified chronic kidney disease: Secondary | ICD-10-CM | POA: Insufficient documentation

## 2018-04-27 DIAGNOSIS — I712 Thoracic aortic aneurysm, without rupture: Secondary | ICD-10-CM | POA: Insufficient documentation

## 2018-04-27 DIAGNOSIS — I70211 Atherosclerosis of native arteries of extremities with intermittent claudication, right leg: Secondary | ICD-10-CM | POA: Insufficient documentation

## 2018-04-27 DIAGNOSIS — E785 Hyperlipidemia, unspecified: Secondary | ICD-10-CM | POA: Diagnosis not present

## 2018-04-27 HISTORY — PX: ABDOMINAL AORTOGRAM W/LOWER EXTREMITY: CATH118223

## 2018-04-27 HISTORY — PX: PERIPHERAL VASCULAR ATHERECTOMY: CATH118256

## 2018-04-27 HISTORY — PX: PERIPHERAL VASCULAR INTERVENTION: CATH118257

## 2018-04-27 LAB — POCT ACTIVATED CLOTTING TIME
ACTIVATED CLOTTING TIME: 202 s
ACTIVATED CLOTTING TIME: 235 s
ACTIVATED CLOTTING TIME: 252 s
ACTIVATED CLOTTING TIME: 213 s
Activated Clotting Time: 213 seconds
Activated Clotting Time: 235 seconds
Activated Clotting Time: 164 seconds

## 2018-04-27 LAB — GLUCOSE, CAPILLARY
GLUCOSE-CAPILLARY: 132 mg/dL — AB (ref 70–99)
GLUCOSE-CAPILLARY: 144 mg/dL — AB (ref 70–99)
GLUCOSE-CAPILLARY: 181 mg/dL — AB (ref 70–99)
Glucose-Capillary: 127 mg/dL — ABNORMAL HIGH (ref 70–99)

## 2018-04-27 LAB — BASIC METABOLIC PANEL
Anion gap: 10 (ref 5–15)
BUN: 20 mg/dL (ref 8–23)
CHLORIDE: 107 mmol/L (ref 98–111)
CO2: 25 mmol/L (ref 22–32)
Calcium: 9 mg/dL (ref 8.9–10.3)
Creatinine, Ser: 1.24 mg/dL (ref 0.61–1.24)
Glucose, Bld: 141 mg/dL — ABNORMAL HIGH (ref 70–99)
POTASSIUM: 4 mmol/L (ref 3.5–5.1)
Sodium: 142 mmol/L (ref 135–145)

## 2018-04-27 SURGERY — ABDOMINAL AORTOGRAM W/LOWER EXTREMITY
Anesthesia: LOCAL

## 2018-04-27 MED ORDER — SODIUM CHLORIDE 0.9% FLUSH: 3.0000 mL | Freq: Two times a day (BID) | INTRAVENOUS | Status: DC

## 2018-04-27 MED ORDER — CLOPIDOGREL BISULFATE 300 MG PO TABS
ORAL_TABLET | ORAL | Status: AC
  Filled 2018-04-27: qty 1

## 2018-04-27 MED ORDER — CLOPIDOGREL BISULFATE 300 MG PO TABS
ORAL_TABLET | ORAL | Status: DC | PRN
  Administered 2018-04-27: 300 mg via ORAL

## 2018-04-27 MED ORDER — SODIUM CHLORIDE 0.9 % WEIGHT BASED INFUSION: 1.0000 mL/kg/h | INTRAVENOUS | Status: DC

## 2018-04-27 MED ORDER — HEPARIN (PORCINE) IN NACL 1000-0.9 UT/500ML-% IV SOLN
INTRAVENOUS | Status: AC
  Filled 2018-04-27: qty 1000

## 2018-04-27 MED ORDER — SODIUM CHLORIDE 0.9% FLUSH
3.0000 mL | Freq: Two times a day (BID) | INTRAVENOUS | Status: DC
  Administered 2018-04-27 – 2018-04-28 (×2): 3 mL via INTRAVENOUS

## 2018-04-27 MED ORDER — HEPARIN SODIUM (PORCINE) 1000 UNIT/ML IJ SOLN
INTRAMUSCULAR | Status: AC
  Filled 2018-04-27: qty 1

## 2018-04-27 MED ORDER — FENOFIBRATE 160 MG PO TABS
160.0000 mg | ORAL_TABLET | Freq: Every day | ORAL | Status: DC
  Administered 2018-04-28: 09:00:00 160 mg via ORAL
  Filled 2018-04-27: qty 1

## 2018-04-27 MED ORDER — VERAPAMIL HCL 2.5 MG/ML IV SOLN
INTRAVENOUS | Status: AC
  Filled 2018-04-27: qty 2

## 2018-04-27 MED ORDER — LISINOPRIL 10 MG PO TABS
20.0000 mg | ORAL_TABLET | Freq: Every day | ORAL | Status: DC
  Administered 2018-04-28: 09:00:00 20 mg via ORAL
  Filled 2018-04-27: qty 2

## 2018-04-27 MED ORDER — ATORVASTATIN CALCIUM 40 MG PO TABS
40.0000 mg | ORAL_TABLET | Freq: Every day | ORAL | Status: DC
  Administered 2018-04-28: 40 mg via ORAL
  Filled 2018-04-27: qty 1

## 2018-04-27 MED ORDER — FENTANYL CITRATE (PF) 100 MCG/2ML IJ SOLN
INTRAMUSCULAR | Status: AC
  Filled 2018-04-27: qty 2

## 2018-04-27 MED ORDER — MIDAZOLAM HCL 2 MG/2ML IJ SOLN
INTRAMUSCULAR | Status: DC | PRN
  Administered 2018-04-27: 1 mg via INTRAVENOUS

## 2018-04-27 MED ORDER — INSULIN ASPART 100 UNIT/ML ~~LOC~~ SOLN
0.0000 [IU] | Freq: Three times a day (TID) | SUBCUTANEOUS | Status: DC
  Administered 2018-04-27 – 2018-04-28 (×2): 2 [IU] via SUBCUTANEOUS

## 2018-04-27 MED ORDER — MORPHINE SULFATE (PF) 2 MG/ML IV SOLN: 2.0000 mg | INTRAVENOUS | Status: DC | PRN

## 2018-04-27 MED ORDER — ATORVASTATIN CALCIUM 80 MG PO TABS: 80.0000 mg | ORAL_TABLET | Freq: Every day | ORAL | Status: DC

## 2018-04-27 MED ORDER — ONDANSETRON HCL 4 MG/2ML IJ SOLN: 4.0000 mg | Freq: Four times a day (QID) | INTRAMUSCULAR | Status: DC | PRN

## 2018-04-27 MED ORDER — SODIUM CHLORIDE 0.9 % IV SOLN: INTRAVENOUS | Status: AC

## 2018-04-27 MED ORDER — SODIUM CHLORIDE 0.9 % WEIGHT BASED INFUSION
3.0000 mL/kg/h | INTRAVENOUS | Status: DC
  Administered 2018-04-27: 3 mL/kg/h via INTRAVENOUS

## 2018-04-27 MED ORDER — CARVEDILOL 12.5 MG PO TABS
25.0000 mg | ORAL_TABLET | Freq: Two times a day (BID) | ORAL | Status: DC
  Administered 2018-04-27 – 2018-04-28 (×2): 25 mg via ORAL
  Filled 2018-04-27 (×2): qty 2

## 2018-04-27 MED ORDER — ASPIRIN EC 81 MG PO TBEC: 81.0000 mg | DELAYED_RELEASE_TABLET | Freq: Every day | ORAL | Status: DC

## 2018-04-27 MED ORDER — HYDRALAZINE HCL 20 MG/ML IJ SOLN: 5.0000 mg | INTRAMUSCULAR | Status: DC | PRN

## 2018-04-27 MED ORDER — NITROGLYCERIN IN D5W 200-5 MCG/ML-% IV SOLN
INTRAVENOUS | Status: AC
  Filled 2018-04-27: qty 250

## 2018-04-27 MED ORDER — ACETAMINOPHEN 325 MG PO TABS: 650.0000 mg | ORAL_TABLET | ORAL | Status: DC | PRN

## 2018-04-27 MED ORDER — FENTANYL CITRATE (PF) 100 MCG/2ML IJ SOLN
INTRAMUSCULAR | Status: DC | PRN
  Administered 2018-04-27: 25 ug via INTRAVENOUS

## 2018-04-27 MED ORDER — SODIUM CHLORIDE 0.9% FLUSH: 3.0000 mL | INTRAVENOUS | Status: DC | PRN

## 2018-04-27 MED ORDER — HYDROCHLOROTHIAZIDE 25 MG PO TABS
25.0000 mg | ORAL_TABLET | Freq: Every day | ORAL | Status: DC
  Administered 2018-04-28: 09:00:00 25 mg via ORAL
  Filled 2018-04-27: qty 1

## 2018-04-27 MED ORDER — NITROGLYCERIN 1 MG/10 ML FOR IR/CATH LAB
INTRA_ARTERIAL | Status: DC | PRN
  Administered 2018-04-27 (×2): 200 ug via INTRA_ARTERIAL

## 2018-04-27 MED ORDER — CLOPIDOGREL BISULFATE 75 MG PO TABS
75.0000 mg | ORAL_TABLET | Freq: Every day | ORAL | Status: DC
  Administered 2018-04-28: 08:00:00 75 mg via ORAL
  Filled 2018-04-27: qty 1

## 2018-04-27 MED ORDER — LINAGLIPTIN 5 MG PO TABS
5.0000 mg | ORAL_TABLET | Freq: Every day | ORAL | Status: DC
  Administered 2018-04-28: 09:00:00 5 mg via ORAL
  Filled 2018-04-27: qty 1

## 2018-04-27 MED ORDER — SODIUM CHLORIDE 0.9 % IV SOLN: 250.0000 mL | INTRAVENOUS | Status: DC | PRN

## 2018-04-27 MED ORDER — ANGIOPLASTY BOOK
Freq: Once | Status: AC
  Administered 2018-04-27: 22:00:00
  Filled 2018-04-27: qty 1

## 2018-04-27 MED ORDER — IODIXANOL 320 MG/ML IV SOLN
INTRAVENOUS | Status: DC | PRN
  Administered 2018-04-27: 220 mL via INTRA_ARTERIAL

## 2018-04-27 MED ORDER — HEPARIN SODIUM (PORCINE) 1000 UNIT/ML IJ SOLN
INTRAMUSCULAR | Status: DC | PRN
  Administered 2018-04-27 (×2): 3000 [IU] via INTRAVENOUS
  Administered 2018-04-27: 2000 [IU] via INTRAVENOUS
  Administered 2018-04-27: 9000 [IU] via INTRAVENOUS
  Administered 2018-04-27: 2500 [IU] via INTRAVENOUS

## 2018-04-27 MED ORDER — LABETALOL HCL 5 MG/ML IV SOLN: 10.0000 mg | INTRAVENOUS | Status: DC | PRN

## 2018-04-27 MED ORDER — LIDOCAINE HCL (PF) 1 % IJ SOLN
INTRAMUSCULAR | Status: AC
  Filled 2018-04-27: qty 30

## 2018-04-27 MED ORDER — HEPARIN (PORCINE) IN NACL 1000-0.9 UT/500ML-% IV SOLN
INTRAVENOUS | Status: DC | PRN
  Administered 2018-04-27 (×2): 500 mL

## 2018-04-27 MED ORDER — MIDAZOLAM HCL 2 MG/2ML IJ SOLN
INTRAMUSCULAR | Status: AC
  Filled 2018-04-27: qty 2

## 2018-04-27 MED ORDER — VIPERSLIDE LUBRICANT OPTIME
TOPICAL | Status: DC | PRN
  Administered 2018-04-27: 10:00:00 via SURGICAL_CAVITY

## 2018-04-27 MED ORDER — LISINOPRIL-HYDROCHLOROTHIAZIDE 20-25 MG PO TABS: 1.0000 | ORAL_TABLET | Freq: Every day | ORAL | Status: DC

## 2018-04-27 MED ORDER — LIDOCAINE HCL (PF) 1 % IJ SOLN
INTRAMUSCULAR | Status: DC | PRN
  Administered 2018-04-27: 30 mL

## 2018-04-27 MED ORDER — ASPIRIN 81 MG PO CHEW: 81.0000 mg | CHEWABLE_TABLET | ORAL | Status: DC

## 2018-04-27 MED ORDER — ASPIRIN EC 81 MG PO TBEC
81.0000 mg | DELAYED_RELEASE_TABLET | Freq: Every day | ORAL | Status: DC
  Administered 2018-04-28: 09:00:00 81 mg via ORAL
  Filled 2018-04-27: qty 1

## 2018-04-27 SURGICAL SUPPLY — 41 items
BAG SNAP BAND KOVER 36X36 (MISCELLANEOUS) ×3 IMPLANT
BALLN IN.PACT DCB 6X60 (BALLOONS) ×3
BALLN MUSTANG 7.0X40 135 (BALLOONS) ×3
BALLN STERLING OTW 5X40X135 (BALLOONS) ×3
BALLOON MUSTANG 7.0X40 135 (BALLOONS) ×2 IMPLANT
BALLOON STERLING OTW 5X40X135 (BALLOONS) ×2 IMPLANT
CATH ANGIO 5F PIGTAIL 65CM (CATHETERS) ×3 IMPLANT
CATH CROSS OVER TEMPO 5F (CATHETERS) ×3 IMPLANT
CATH CXI 2.3F 150 ANG (CATHETERS) ×3 IMPLANT
CATH NAVICROSS ANGLED 135CM (MICROCATHETER) ×3 IMPLANT
CATH QUICKCROSS .018X135CM (MICROCATHETER) ×3 IMPLANT
CATH QUICKCROSS .035X135CM (MICROCATHETER) IMPLANT
CATH STRAIGHT 5FR 65CM (CATHETERS) ×3 IMPLANT
DCB IN.PACT 6X60 (BALLOONS) ×2 IMPLANT
DEVICE EMBOSHIELD NAV6 4.0-7.0 (FILTER) ×3 IMPLANT
DEVICE TORQUE .014-.018 (MISCELLANEOUS) ×2 IMPLANT
DIAMONDBACK SOLID OAS 2.0MM (CATHETERS) ×3
GUIDEWIRE ANGLED .035X150CM (WIRE) ×3 IMPLANT
GUIDEWIRE REGALIA .014X300CM (WIRE) ×3 IMPLANT
KIT ENCORE 26 ADVANTAGE (KITS) ×3 IMPLANT
KIT PV (KITS) ×3 IMPLANT
LUBRICANT VIPERSLIDE CORONARY (MISCELLANEOUS) ×3 IMPLANT
SHEATH HIGHFLEX ANSEL 7FR 55CM (SHEATH) ×3 IMPLANT
SHEATH PINNACLE 5F 10CM (SHEATH) ×3 IMPLANT
SHEATH PINNACLE 7F 10CM (SHEATH) ×3 IMPLANT
SHEATH PROBE COVER 6X72 (BAG) ×3 IMPLANT
STENT TIGRIS 7X40X120 (Permanent Stent) ×3 IMPLANT
STOPCOCK MORSE 400PSI 3WAY (MISCELLANEOUS) ×3 IMPLANT
SYRINGE MEDRAD AVANTA MACH 7 (SYRINGE) ×3 IMPLANT
SYSTEM DIMNDBCK SLD OAS 2.0MM (CATHETERS) ×2 IMPLANT
TAPE VIPERTRACK RADIOPAQ (MISCELLANEOUS) ×2 IMPLANT
TAPE VIPERTRACK RADIOPAQUE (MISCELLANEOUS) ×1
TORQUE DEVICE .014-.018 (MISCELLANEOUS) ×3
TRANSDUCER W/STOPCOCK (MISCELLANEOUS) ×3 IMPLANT
TRAY PV CATH (CUSTOM PROCEDURE TRAY) ×3 IMPLANT
TUBING CIL FLEX 10 FLL-RA (TUBING) ×3 IMPLANT
WIRE HI TORQ VERSACORE J 260CM (WIRE) ×3 IMPLANT
WIRE HITORQ VERSACORE ST 145CM (WIRE) ×3 IMPLANT
WIRE ROSEN-J .035X260CM (WIRE) ×3 IMPLANT
WIRE SPARTACORE .014X300CM (WIRE) ×3 IMPLANT
WIRE VIPER ADVANCE .017X335CM (WIRE) ×6 IMPLANT

## 2018-04-27 NOTE — Progress Notes (Signed)
Site area: left groin  Site Prior to Removal:  Level 0  Pressure Applied For 30 MINUTES    Minutes Beginning at 1255  Manual:   Yes.    Patient Status During Pull:  Stable   Post Pull Groin Site:  Level 0  Post Pull Instructions Given:  Yes.    Post Pull Pulses Present:  Yes.    Dressing Applied:  Yes.    Comments:  Patient tolerated procedure well. Dry, Sterile pressure dressing applied post pull.

## 2018-04-27 NOTE — Discharge Summary (Signed)
Discharge Summary    Patient ID: Matthew Chen,  MRN: [id number], DOB/AGE: 10-18-1955 62 y.o.  Admit date: 04/27/2018 Discharge date: 04/28/2018  Primary Care Provider: Jenna Luo Chen Primary Cardiologist: Dr. Gwenlyn Chen   Discharge Diagnoses    Active Problems:   Peripheral arterial disease [facility])   Claudication in peripheral vascular disease (HCC)   Allergies Allergies  Allergen Reactions  . Penicillins Other (See Comments)    Unsure, childhood reaction Has patient had a PCN reaction causing immediate rash, facial/tongue/throat swelling, SOB or lightheadedness with hypotension: Unknown Has patient had a PCN reaction causing severe rash involving mucus membranes or skin necrosis: Unknown Has patient had a PCN reaction that required hospitalization: No Has patient had a PCN reaction occurring within the last 10 years: No If all of the above answers are "NO", then may proceed with Cephalosporin use.     Diagnostic Studies/Procedures    PV Angiogram: 04/27/18  Final Impression: Successful diamondback orbital rotational atherectomy, drug-coated balloon angioplasty and Tigris self-expanding stenting using NAV 6 distal protection for a high-grade calcified restenotic plaque in the right popliteal artery in the setting of lifestyle limiting claudication.  Patient had an excellent angiographic result.  He did have excessive fluoroscopy time because of the difficulty of the case.  The sheath will be removed and pressure held once the ACT falls below 170.  He left the lab in stable condition.  He will be hydrated overnight and discharged home in the morning.  We will obtain lower extremity arterial Doppler studies in our [facility] line office next week and I will see him back 2 to 3 weeks thereafter.   Matthew Chen. MD, [facility] _____________   History of Present Illness     62 y.o. with PMH of DM, HTN, HL, CKD, UC and PAD who is followed by Dr. Gwenlyn Chen as an outpatient. Referred by Dr.  Skeet Chen for evaluation and treatment of claudication. 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 He's never had a heart attack or stroke and denied chest pain or shortness of breath. He did complain of lifestyle limiting claudication and had arterial Doppler studies performed in our office 05/29/15 revealing high-grade right popliteal and distal left SFA disease. Based on this, Dr. Gwenlyn Chen decided to proceed with angiography and potential percutaneous revascularization. He initially underwent angiogram on 06/29/15 and performed diamondback orbital rotational atherectomy, drug eluting balloon angioplasty of the distal left SFA for a high-grade, subocclusive calcific/exophytic plaque. He had a high-grade calcific/exophytic subocclusive right popliteal artery plaque for which he underwent diamondback orbital rotation arthrectomy, drug eluding balloon angioplasty on 07/13/15 with an excellent angiographic result. Since that time his claudication had resolved. Recently developed recurrent claudication and his Dopplers performed 03/17/2018 revealed normal ABIs bilaterally with the development of a high-frequency signal in his right popliteal artery. Given this finding he was set up for outpatient PV angiogram with Dr. Gwenlyn Chen.   Hospital Course     Underwent PV angiogram noted above with successful diamondback orbital rotational atherectomy, drug-coated balloon angioplasty and Tigris self-expanding stenting using NAV 6 distal protection for a high-grade calcified restenotic plaque. Plan for DAPT with ASA/plavix. Morning labs were stable. No complications noted post cath. Able to ambulate without difficulty.   General: Well developed, well nourished, male appearing in no acute distress. Head: Normocephalic, atraumatic.  Neck: Supple without bruits, JVD. Lungs:  Resp regular and unlabored, CTA. Heart: RRR, S1, S2, no S3, S4,  or murmur; no rub. Abdomen: Soft,  non-tender, non-distended with normoactive bowel sounds. No hepatomegaly. No rebound/guarding. No obvious abdominal masses. Extremities: No clubbing, cyanosis, edema. Distal pedal pulses are 2+ bilaterally. Left femoral cath site stable without bruising or hematoma Neuro: Alert and oriented X 3. Moves all extremities spontaneously. Psych: Normal affect.  Matthew Chen was seen by Dr. Angelena Chen and determined stable for discharge home. Follow up in the office has been arranged. Medications are listed below.   _____________  Discharge Vitals Blood pressure (!) 147/65, pulse 98, temperature 98.3 F (36.8 C), temperature source Oral, resp. rate 16, height 5' 10"  (1.778 m), weight 92 kg, SpO2 98 %.  Filed Weights   04/27/18 0658 04/27/18 1110 04/28/18 0632  Weight: 93.9 kg 93.8 kg 92 kg    Labs & Radiologic Studies    CBC Recent Labs    04/28/18 0239  WBC 8.6  HGB 10.7*  HCT 34.0*  MCV 90.2  PLT 462   Basic Metabolic Panel Recent Labs    04/27/18 0749 04/28/18 0239  NA 142 140  K 4.0 3.7  CL 107 104  CO2 25 27  GLUCOSE 141* 156*  BUN 20 20  CREATININE 1.24 1.31*  CALCIUM 9.0 8.9   Liver Function Tests No results for input(s): AST, ALT, ALKPHOS, BILITOT, PROT, ALBUMIN in the last 72 hours. No results for input(s): LIPASE, AMYLASE in the last 72 hours. Cardiac Enzymes No results for input(s): CKTOTAL, CKMB, CKMBINDEX, TROPONINI in the last 72 hours. BNP Invalid input(s): POCBNP D-Dimer No results for input(s): DDIMER in the last 72 hours. Hemoglobin A1C No results for input(s): HGBA1C in the last 72 hours. Fasting Lipid Panel No results for input(s): CHOL, HDL, LDLCALC, TRIG, CHOLHDL, LDLDIRECT in the last 72 hours. Thyroid Function Tests No results for input(s): TSH, T4TOTAL, T3FREE, THYROIDAB in the last 72 hours.  Invalid input(s): FREET3 _____________  No results Chen. Disposition   Pt is being discharged home today in good condition.  Follow-up Plans &  Appointments    Follow-up Information    [facility] Follow up on 05/04/2018.   Specialty:  Cardiology Why:  at 8:30am for your follow dopplers. Contact information: [address] [place]       Lorretta Harp, MD Follow up on 05/19/2018.   Specialties:  Cardiology, Radiology Why:  at 11am for your follow up appt.  Contact information: [address] [place] [address] [phone]             Discharge Medications     Medication List    TAKE these medications   aspirin EC 81 MG tablet Take 1 tablet (81 mg total) by mouth daily.   atorvastatin 40 MG tablet Commonly known as:  LIPITOR TAKE 1 TABLET BY MOUTH EVERY DAY   blood glucose meter kit and supplies Dispense based on patient and insurance preference. Check BS daily. 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 Needs strips and lancets #100 /5 refills   Budesonide ER 9 MG Tb24 Take 1 tablet by mouth daily. Every morning.   carvedilol 25 MG tablet Commonly known as:  COREG TAKE 1 TABLET BY MOUTH TWICE A DAY   clopidogrel 75 MG tablet Commonly known as:  PLAVIX Take 1 tablet (75 mg total) by mouth daily with breakfast. Start taking on:  04/29/2018   dicyclomine 10 MG capsule Commonly known as:  BENTYL Take 1 tablet by mouth 3-4 times daily as needed for  urgency and spasms.   ENTYVIO 300 MG injection Generic drug:  vedolizumab Inject 300 mg into the vein every 8 (eight) weeks.   fenofibrate 160 MG tablet TAKE 1 TABLET BY MOUTH EVERY DAY   glucose blood test strip Check BS bid   Insulin Pen Needle 31G X 5 MM Misc USE DAILY WITH VICTOZA   linagliptin 5 MG Tabs tablet Commonly known as:  TRADJENTA Take 1 tablet (5 mg total) by mouth daily.   lisinopril-hydrochlorothiazide 20-25 MG tablet Commonly known as:  PRINZIDE,ZESTORETIC TAKE 1 TABLET BY MOUTH EVERY DAY   mesalamine 1.2 g EC tablet Commonly known as:  LIALDA Take 2 tablets  (2.4 g total) by mouth 2 (two) times daily.   metFORMIN 1000 MG tablet Commonly known as:  GLUCOPHAGE TAKE 1 TABLET BY MOUTH TWICE A DAY WITH MEALS   mupirocin cream 2 % Commonly known as:  BACTROBAN Apply 1 application topically 2 (two) times daily. What changed:    when to take this  reasons to take this   sildenafil 100 MG tablet Commonly known as:  VIAGRA Take 0.5-1 tablets (50-100 mg total) daily as needed by mouth for erectile dysfunction.   VICTOZA 18 MG/3ML Sopn Generic drug:  liraglutide INJECT 0.2 MLS (1.2 MG TOTAL) DAILY INTO THE SKIN. What changed:    See the new instructions.  Another medication with the same name was removed. Continue taking this medication, and follow the directions you see here.       Acute coronary syndrome (MI, NSTEMI, STEMI, etc) this admission?: No.     Outstanding Labs/Studies   Follow up dopplers  Duration of Discharge Encounter   Greater than 30 minutes including physician time.  Signed, Reino Bellis NP-C 04/28/2018, 8:58 AM   I have personally seen and examined this patient. I agree with the assessment and plan as outlined above. He is s/p PV intervention of the right SFA yesterday. He is doing well this am. No pain in his leg. Left groin access site is ok without hematoma. Will continue aSA and Plavix.  Discharge home today and follow up with Dr. Gwenlyn Chen per PV protocol.   Lauree Chandler 04/28/2018 9:40 AM

## 2018-04-27 NOTE — Progress Notes (Signed)
Patient's left groin site re bled.  Pressure held for 20 minutes.  Hemostasis achieved.  A dry sterile pressure dressing was applied.  Post pull instructions reviewed with the patient.  Patient voices understanding to teaching.

## 2018-04-27 NOTE — Interval H&P Note (Signed)
History and Physical Interval Note:  04/27/2018 8:27 AM  Matthew Chen  has presented today for surgery, with the diagnosis of pad  The various methods of treatment have been discussed with the patient and family. After consideration of risks, benefits and other options for treatment, the patient has consented to  Procedure(s): ABDOMINAL AORTOGRAM W/LOWER EXTREMITY (N/A) as a surgical intervention .  The patient's history has been reviewed, patient examined, no change in status, stable for surgery.  I have reviewed the patient's chart and labs.  Questions were answered to the patient's satisfaction.     Quay Burow

## 2018-04-28 DIAGNOSIS — I739 Peripheral vascular disease, unspecified: Secondary | ICD-10-CM | POA: Diagnosis not present

## 2018-04-28 DIAGNOSIS — E1151 Type 2 diabetes mellitus with diabetic peripheral angiopathy without gangrene: Secondary | ICD-10-CM | POA: Diagnosis not present

## 2018-04-28 LAB — CBC
HCT: 34 % — ABNORMAL LOW (ref 39.0–52.0)
Hemoglobin: 10.7 g/dL — ABNORMAL LOW (ref 13.0–17.0)
MCH: 28.4 pg (ref 26.0–34.0)
MCHC: 31.5 g/dL (ref 30.0–36.0)
MCV: 90.2 fL (ref 78.0–100.0)
PLATELETS: 212 10*3/uL (ref 150–400)
RBC: 3.77 MIL/uL — AB (ref 4.22–5.81)
RDW: 15.1 % (ref 11.5–15.5)
WBC: 8.6 10*3/uL (ref 4.0–10.5)

## 2018-04-28 LAB — GLUCOSE, CAPILLARY
GLUCOSE-CAPILLARY: 123 mg/dL — AB (ref 70–99)
Glucose-Capillary: 136 mg/dL — ABNORMAL HIGH (ref 70–99)

## 2018-04-28 LAB — BASIC METABOLIC PANEL
Anion gap: 9 (ref 5–15)
BUN: 20 mg/dL (ref 8–23)
CALCIUM: 8.9 mg/dL (ref 8.9–10.3)
CO2: 27 mmol/L (ref 22–32)
CREATININE: 1.31 mg/dL — AB (ref 0.61–1.24)
Chloride: 104 mmol/L (ref 98–111)
GFR calc non Af Amer: 57 mL/min — ABNORMAL LOW (ref >60)
Glucose, Bld: 156 mg/dL — ABNORMAL HIGH (ref 70–99)
Potassium: 3.7 mmol/L (ref 3.5–5.1)
Sodium: 140 mmol/L (ref 135–145)

## 2018-04-28 MED ORDER — CLOPIDOGREL BISULFATE 75 MG PO TABS: 75.0000 mg | ORAL_TABLET | Freq: Every day | ORAL | 1 refills | Status: DC

## 2018-04-28 NOTE — Plan of Care (Signed)
  Problem: Education: Goal: Understanding of CV disease, CV risk reduction, and recovery process will improve 04/28/2018 1129 by Jeanne Ivan, RN Outcome: Completed/Met 04/28/2018 0848 by Jeanne Ivan, RN Outcome: Progressing Goal: Individualized Educational Video(s) 04/28/2018 1129 by Jeanne Ivan, RN Outcome: Completed/Met 04/28/2018 0848 by Jeanne Ivan, RN Outcome: Progressing   Problem: Health Behavior/Discharge Planning: Goal: Ability to safely manage health-related needs after discharge will improve 04/28/2018 1129 by Jeanne Ivan, RN Outcome: Completed/Met 04/28/2018 0848 by Jeanne Ivan, RN Outcome: Progressing   Problem: Health Behavior/Discharge Planning: Goal: Ability to manage health-related needs will improve 04/28/2018 1129 by Jeanne Ivan, RN Outcome: Completed/Met 04/28/2018 0848 by Jeanne Ivan, RN Outcome: Progressing   Problem: Safety: Goal: Ability to remain free from injury will improve 04/28/2018 1129 by Jeanne Ivan, RN Outcome: Completed/Met 04/28/2018 0848 by Jeanne Ivan, RN Outcome: Progressing

## 2018-04-28 NOTE — Plan of Care (Signed)
  Problem: Education: Goal: Understanding of CV disease, CV risk reduction, and recovery process will improve Outcome: Progressing Goal: Individualized Educational Video(s) Outcome: Progressing   Problem: Health Behavior/Discharge Planning: Goal: Ability to safely manage health-related needs after discharge will improve Outcome: Progressing   Problem: Health Behavior/Discharge Planning: Goal: Ability to manage health-related needs will improve Outcome: Progressing   Problem: Safety: Goal: Ability to remain free from injury will improve Outcome: Progressing

## 2018-04-28 NOTE — Progress Notes (Signed)
Discharge instructions (including medications) discussed with and copy provided to patient/caregiver 

## 2018-04-28 NOTE — Discharge Instructions (Signed)
Angiogram An angiogram is an X-ray test. It is used to look at your blood vessels. For this test, a dye is put into the blood vessel being checked. The dye shows up on X-rays. It helps your doctor see if there is a blockage or other problem in the blood vessel. What happens before the procedure?  Follow your doctor's instructions about limiting what you eat or drink.  Ask your doctor if you may drink enough water to take any needed medicines the morning of the test.  Plan to have someone take you home after the test.  If you go home the same day as the test, plan to have someone stay with you for 24 hours. What happens during the procedure?  An IV tube will be put into one of your veins.  You will be given a medicine that makes you relax (sedative).  Your skin will be washed where the thin tube (catheter) will be put in. Hair may be removed from this area. The tube may be put into: ? Your upper leg area (groin). ? The fold of your arm, near your elbow. ? Your wrist.  You will be given a medicine that numbs the area where the tube will be inserted (local anesthetic).  The tube will be inserted into a blood vessel.  Using a type of X-ray (fluoroscopy) to see, your doctor will move the tube into the blood vessel to check it.  Dye will be put in through the tube. X-rays of your blood vessels will then be taken. Different doctors and hospitals may do this procedure differently. What happens after the procedure?  If the test is done through the leg, you will be kept in bed lying flat for several hours. You will be told to not bend or cross your legs.  The area where the tube was inserted will be checked often.  The pulse in your feet or wrist will be checked often.  More tests or X-rays may be done. This information is not intended to replace advice given to you by your health care provider. Make sure you discuss any questions you have with your health care provider. Document  Released: 10/25/2008 Document Revised: 01/04/2016 Document Reviewed: 12/30/2012 Elsevier Interactive Patient Education  2017 Elsevier Inc.  Ankle-Brachial Index Test The ankle-brachial index (ABI) test is used to find peripheral vascular disease (PVD). PVD is also known as peripheral arterial disease (PAD). PVD is the blocking or hardening of the arteries anywhere within the circulatory system beyond the heart. PVD is caused by cholesterol deposits in your blood vessels (atherosclerosis). These deposits cause arteries to narrow. The delivery of oxygen to your tissues is impaired as a result. This can cause muscle pain and fatigue. This is called claudication. PVD means there may also be buildup of cholesterol in your:  Heart. This increases the risk of heart attacks.  Brain. This increases the risk of strokes.  The ankle-brachial index test measures the blood flow in your arms and legs. This test also determines if blood vessels in your leg are narrowed by cholesterol deposits. There are additional causes of a reduced ankle-brachial index, such as inflammation of vessels or a clot in the vessels. However, these are much less common than narrowing due to cholesterol deposits. What is being tested? The test is done while you are lying down and resting. Measurements are taken of the systolic pressure:  In your arm (brachial).  In your ankle at several points along your leg.  Systolic  pressure is the pressure inside your arteries when your heart pumps. The measurements are taken several times on both sides. Then, the highest systolic pressure of the ankle is divided by the highest brachial systolic pressure. The result is the ankle-brachial pressure ratio, or ABI. Sometimes this test is repeated after you have exercised on a treadmill for five minutes. You may have leg pain during the exercise portion of the test if you suffer from PAD. If the index number drops after exercise, this may show that  PAD is present. A normal ABI ratio is between 0.9 and 1.4. A value below 0.9 is considered abnormal. This information is not intended to replace advice given to you by your health care provider. Make sure you discuss any questions you have with your health care provider. Document Released: 08/02/2004 Document Revised: 01/04/2016 Document Reviewed: 03/04/2014 Elsevier Interactive Patient Education  2018 [place] After Refer to this sheet in the next few weeks. These instructions provide you with information about caring for yourself after your procedure. Your health care provider may also give you more specific instructions. Your treatment has been planned according to current medical practices, but problems sometimes occur. Call your health care provider if you have any problems or questions after your procedure. What can I expect after the procedure? After the procedure, it is common to have:  A tender lump in your groin.  Bruising.  Soreness.  Follow these instructions at home: Incision care  There are many ways to close and cover an incision. For example, an incision can be closed with stitches (sutures), skin glue, or adhesive strips. Follow instructions from your health care provider about: ? How to take care of your incision. ? When and how you should change your bandage (dressing). ? When you should remove your dressing. ? Removing whatever was used to close your incision.  Check your incision area every day for signs of infection. Watch for: ? Redness, swelling, or pain. ? Fluid, blood, or pus.  Do not take baths, swim, or use a hot tub until your health care provider approves. Ask your health care provider if you can take showers. Activity  Return to your normal activities as told by your health care provider. Ask your health care provider what activities are safe for you.  Do not lift anything that is heavier than 10 lb (4.5 kg). General  instructions  Take over-the-counter and prescription medicines only as told by your health care provider.  Drink enough fluid to keep your urine clear or pale yellow.  Keep all follow-up visits as told by your health care provider. This is important. Contact a health care provider if:  You have a fever or chills.  You have redness, swelling, or pain at your incision site.  You have fluid, blood, or pus coming from your incision site. Get help right away if:  You have uncontrolled bleeding at your incision site.  You have chest pain.  You have trouble breathing. This information is not intended to replace advice given to you by your health care provider. Make sure you discuss any questions you have with your health care provider. Document Released: 12/13/2014 Document Revised: 01/04/2016 Document Reviewed: 07/25/2014 Elsevier Interactive Patient Education  [employer].

## 2018-04-30 ENCOUNTER — Other Ambulatory Visit (INDEPENDENT_AMBULATORY_CARE_PROVIDER_SITE_OTHER): Payer: 59

## 2018-04-30 DIAGNOSIS — K51319 Ulcerative (chronic) rectosigmoiditis with unspecified complications: Secondary | ICD-10-CM

## 2018-04-30 LAB — CBC WITH DIFFERENTIAL/PLATELET
BASOS PCT: 0.6 % (ref 0.0–3.0)
Basophils Absolute: 0 10*3/uL (ref 0.0–0.1)
Eosinophils Absolute: 0.2 10*3/uL (ref 0.0–0.7)
Eosinophils Relative: 2.3 % (ref 0.0–5.0)
HEMATOCRIT: 35.4 % — AB (ref 39.0–52.0)
HEMOGLOBIN: 11.8 g/dL — AB (ref 13.0–17.0)
LYMPHS PCT: 15.5 % (ref 12.0–46.0)
Lymphs Abs: 1.1 10*3/uL (ref 0.7–4.0)
MCHC: 33.2 g/dL (ref 30.0–36.0)
MCV: 86 fl (ref 78.0–100.0)
MONOS PCT: 8.8 % (ref 3.0–12.0)
Monocytes Absolute: 0.6 10*3/uL (ref 0.1–1.0)
Neutro Abs: 5.3 10*3/uL (ref 1.4–7.7)
Neutrophils Relative %: 72.8 % (ref 43.0–77.0)
Platelets: 262 10*3/uL (ref 150.0–400.0)
RBC: 4.12 Mil/uL — ABNORMAL LOW (ref 4.22–5.81)
RDW: 16.8 % — ABNORMAL HIGH (ref 11.5–15.5)
WBC: 7.3 10*3/uL (ref 4.0–10.5)

## 2018-04-30 LAB — SEDIMENTATION RATE: Sed Rate: 41 mm/hr — ABNORMAL HIGH (ref 0–20)

## 2018-04-30 LAB — COMPREHENSIVE METABOLIC PANEL
ALT: 15 U/L (ref 0–53)
AST: 11 U/L (ref 0–37)
Albumin: 3.9 g/dL (ref 3.5–5.2)
Alkaline Phosphatase: 52 U/L (ref 39–117)
BUN: 24 mg/dL — ABNORMAL HIGH (ref 6–23)
CHLORIDE: 103 meq/L (ref 96–112)
CO2: 31 meq/L (ref 19–32)
Calcium: 9.8 mg/dL (ref 8.4–10.5)
Creatinine, Ser: 1.34 mg/dL (ref 0.40–1.50)
GFR: 57.28 mL/min — AB (ref >60.00)
Glucose, Bld: 179 mg/dL — ABNORMAL HIGH (ref 70–99)
Potassium: 3.6 mEq/L (ref 3.5–5.1)
Sodium: 142 mEq/L (ref 135–145)
Total Bilirubin: 0.5 mg/dL (ref 0.2–1.2)
Total Protein: 6.8 g/dL (ref 6.0–8.3)

## 2018-04-30 LAB — HIGH SENSITIVITY CRP: CRP HIGH SENSITIVITY: 14.48 mg/L — AB (ref 0.000–5.000)

## 2018-05-04 ENCOUNTER — Encounter (HOSPITAL_COMMUNITY): Payer: 59

## 2018-05-04 ENCOUNTER — Ambulatory Visit (INDEPENDENT_AMBULATORY_CARE_PROVIDER_SITE_OTHER): Payer: 59 | Admitting: Gastroenterology

## 2018-05-04 ENCOUNTER — Encounter: Payer: Self-pay | Admitting: Gastroenterology

## 2018-05-04 VITALS — BP 110/70 | HR 64 | Ht 70.0 in | Wt 211.2 lb

## 2018-05-04 DIAGNOSIS — K51319 Ulcerative (chronic) rectosigmoiditis with unspecified complications: Secondary | ICD-10-CM

## 2018-05-04 NOTE — Patient Instructions (Addendum)
Stop budesonide.   If in one month you tolerate coming off budesonide then stop mesalamine. If you have any issues contact our office.  Follow up with Dr. Fuller Plan in 3 months.   Continue Entyvio.  Thank you for choosing me and [facility].  Pricilla Riffle. Dagoberto Ligas., MD., Marval Regal

## 2018-05-04 NOTE — Progress Notes (Signed)
    History of Present Illness: This is a 62 year old male with proctosigmoiditis who started [place] in July.  His symptoms have dramatically improved and he is having a well formed bowel movement 2-3 times per day.  No bleeding or mucus.  No abdominal pain.  He feels well.  Current Medications, Allergies, Past Medical History, Past Surgical History, Family History and Social History were reviewed in [employer] record.  Physical Exam: General: Well developed, well nourished, no acute distress Head: Normocephalic and atraumatic Eyes:  sclerae anicteric, EOMI Ears: Normal auditory acuity Mouth: No deformity or lesions Lungs: Clear throughout to auscultation Heart: Regular rate and rhythm; no murmurs, rubs or bruits Abdomen: Soft, non tender and non distended. No masses, hepatosplenomegaly or hernias noted. Normal Bowel sounds Rectal: Not done Musculoskeletal: Symmetrical with no gross deformities  Pulses:  Normal pulses noted Extremities: No clubbing, cyanosis, edema or deformities noted Neurological: Alert oriented x 4, grossly nonfocal Psychological:  Alert and cooperative. Normal mood and affect  Assessment and Recommendations:  1.  Ulcerative proctosigmoiditis. Currently asymptomatic.  Excellent response to Entyvio.  Continue Entyvio infusions every 8 weeks.  We discussed long-term management with Entyvio.  Discontinue budesonide today.  If symptoms remain under very good/excellent control discontinue Lialda in 1 month.  Return office visit in 3 months.  I spent 15 minutes of face-to-face time with the patient. Greater than 50% of the time was spent counseling and coordinating care.

## 2018-05-06 ENCOUNTER — Other Ambulatory Visit: Payer: Self-pay | Admitting: Physician Assistant

## 2018-05-11 DIAGNOSIS — H2512 Age-related nuclear cataract, left eye: Secondary | ICD-10-CM | POA: Diagnosis not present

## 2018-05-11 DIAGNOSIS — H25812 Combined forms of age-related cataract, left eye: Secondary | ICD-10-CM | POA: Diagnosis not present

## 2018-05-14 DIAGNOSIS — H2511 Age-related nuclear cataract, right eye: Secondary | ICD-10-CM | POA: Diagnosis not present

## 2018-05-18 DIAGNOSIS — H2511 Age-related nuclear cataract, right eye: Secondary | ICD-10-CM | POA: Diagnosis not present

## 2018-05-18 DIAGNOSIS — H25811 Combined forms of age-related cataract, right eye: Secondary | ICD-10-CM | POA: Diagnosis not present

## 2018-05-19 ENCOUNTER — Other Ambulatory Visit: Payer: Self-pay | Admitting: Cardiovascular Disease

## 2018-05-19 ENCOUNTER — Ambulatory Visit (HOSPITAL_COMMUNITY)
Admission: RE | Admit: 2018-05-19 | Discharge: 2018-05-19 | Disposition: A | Discharge status: Home / Self Care | Payer: 59 | Source: Ambulatory Visit | Attending: Cardiovascular Disease | Admitting: Cardiovascular Disease

## 2018-05-19 ENCOUNTER — Encounter: Payer: Self-pay | Admitting: Cardiovascular Disease

## 2018-05-19 ENCOUNTER — Ambulatory Visit (INDEPENDENT_AMBULATORY_CARE_PROVIDER_SITE_OTHER): Payer: 59 | Admitting: Cardiovascular Disease

## 2018-05-19 ENCOUNTER — Ambulatory Visit (HOSPITAL_BASED_OUTPATIENT_CLINIC_OR_DEPARTMENT_OTHER)
Admission: RE | Admit: 2018-05-19 | Discharge: 2018-05-19 | Disposition: A | Discharge status: Home / Self Care | Payer: 59 | Source: Ambulatory Visit | Attending: Cardiovascular Disease | Admitting: Cardiovascular Disease

## 2018-05-19 DIAGNOSIS — Z95820 Peripheral vascular angioplasty status with implants and grafts: Secondary | ICD-10-CM | POA: Diagnosis not present

## 2018-05-19 DIAGNOSIS — I739 Peripheral vascular disease, unspecified: Secondary | ICD-10-CM

## 2018-05-19 NOTE — Progress Notes (Signed)
05/19/2018 Matthew Chen   27-Dec-1955  [id number]  Primary Physician Pickard, Cammie Mcgee, MD Primary Cardiologist: Lorretta Harp MD Garret Reddish, [place], [place]  HPI:  Matthew Chen is a 62 y.o.  mild to moderately overweight married Caucasian male father of 4 children, grandfather of a grandchildren was referred by Dr. Skeet Latch for evaluation and treatment of claudication. I last saw him in the office  04/07/2018.He has a history of treated hypertension, diabetes and hyperlipidemia. 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 He's never had a heart attack or stroke and denies chest pain or shortness of breath. He does complain of lifestyle limiting claudication and had arterial Doppler studies performed in our office 05/29/15 revealing high-grade right popliteal and distal left SFA disease. Based on this, we decided to proceed with angiography and potential percutaneous revascularization. I initially angiogrammed him on 06/29/15 and performed diamondback orbital rotational atherectomy, drug eluting balloon angioplasty of the distal left SFA for a high-grade, subocclusive calcific/exophytic plaque. He had a high-grade calcific/exophytic subocclusive right popliteal artery plaque for which he underwent diamondback orbital rotation arthrectomy, drug eluding balloon angioplasty on 07/13/15 with an excellent angiographic result. Since that time his claudication has resolved. His Dopplers performed 03/17/2018 revealed normal ABIs bilaterally with the development of a high-frequency signal in his right popliteal artery.  Since I saw him 8 months ago he has developed lifestyle limiting right calf claudication.  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  He had 30 to 40% mid left SFA stenosis.  I performed diamondback orbital rotational atherectomy, PTA and stenting using a Tigris 7 mm x  40 mm long self-expanding stent.  His Dopplers normalized and his claudication has resolved.   Current Meds  Medication Sig  . aspirin EC 81 MG tablet Take 1 tablet (81 mg total) by mouth daily.  Marland Kitchen atorvastatin (LIPITOR) 40 MG tablet TAKE 1 TABLET BY MOUTH EVERY DAY  . blood glucose meter kit and supplies Dispense based on patient and insurance preference. Check BS daily. 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 Needs strips and lancets #100 /5 refills  . carvedilol (COREG) 25 MG tablet TAKE 1 TABLET BY MOUTH TWICE A DAY  . clopidogrel (PLAVIX) 75 MG tablet Take 1 tablet (75 mg total) by mouth daily with breakfast.  . dicyclomine (BENTYL) 10 MG capsule TAKE 1 TABLET BY MOUTH 3-4 TIMES DAILY AS NEEDED FOR URGENCY AND SPASMS.  . fenofibrate 160 MG tablet TAKE 1 TABLET BY MOUTH EVERY DAY  . glucose blood (ONE TOUCH ULTRA TEST) test strip Check BS bid  . Insulin Pen Needle (B-D UF III MINI PEN NEEDLES) 31G X 5 MM MISC USE DAILY WITH VICTOZA  . linagliptin (TRADJENTA) 5 MG TABS tablet Take 1 tablet (5 mg total) by mouth daily.  Marland Kitchen lisinopril-hydrochlorothiazide (PRINZIDE,ZESTORETIC) 20-25 MG tablet TAKE 1 TABLET BY MOUTH EVERY DAY  . mesalamine (LIALDA) 1.2 g EC tablet Take 2 tablets (2.4 g total) by mouth 2 (two) times daily.  . metFORMIN (GLUCOPHAGE) 1000 MG tablet TAKE 1 TABLET BY MOUTH TWICE A DAY WITH MEALS  . mupirocin cream (BACTROBAN) 2 % Apply 1 application topically 2 (two) times daily. (Patient taking differently: Apply 1 application topically daily as needed (skin irritation). )  . sildenafil (VIAGRA) 100 MG tablet Take 0.5-1 tablets (50-100 mg total) daily as needed by mouth for erectile dysfunction.  Marland Kitchen  vedolizumab (ENTYVIO) 300 MG injection Inject 300 mg into the vein every 8 (eight) weeks.   Marland Kitchen VICTOZA 18 MG/3ML SOPN INJECT 0.2 MLS (1.2 MG TOTAL) DAILY INTO THE SKIN. (Patient taking differently: Inject 1.2 mg into the skin daily. )     Allergies  Allergen Reactions  . Penicillins Other (See Comments)      Unsure, childhood reaction Has patient had a PCN reaction causing immediate rash, facial/tongue/throat swelling, SOB or lightheadedness with hypotension: Unknown Has patient had a PCN reaction causing severe rash involving mucus membranes or skin necrosis: Unknown Has patient had a PCN reaction that required hospitalization: No Has patient had a PCN reaction occurring within the last 10 years: No If all of the above answers are "NO", then may proceed with Cephalosporin use.     Social History   Socioeconomic History  . Marital status: Married    Spouse name: Not on file  . Number of children: 2  . Years of education: Not on file  . Highest education level: Not on file  Occupational History  . Occupation: Truck Education administrator: [employer]  Social Needs  . Financial resource strain: Not on file  . Food insecurity:    Worry: Not on file    Inability: Not on file  . Transportation needs:    Medical: Not on file    Non-medical: Not on file  Tobacco Use  . Smoking status: Former Smoker    Packs/day: 1.50    Years: 25.00    Pack years: 37.50    Types: Cigarettes    Last attempt to quit: 08/12/1996    Years since quitting: 21.7  . Smokeless tobacco: Never Used  Substance and Sexual Activity  . Alcohol use: Yes    Alcohol/week: 1.0 standard drinks    Types: 1 Cans of beer per week    Comment: social use  . Drug use: No  . Sexual activity: Yes  Lifestyle  . Physical activity:    Days per week: Not on file    Minutes per session: Not on file  . Stress: Not on file  Relationships  . Social connections:    Talks on phone: Not on file    Gets together: Not on file    Attends religious service: Not on file    Active member of club or organization: Not on file    Attends meetings of clubs or organizations: Not on file    Relationship status: Not on file  . Intimate partner violence:    Fear of current or ex partner: Not on file    Emotionally abused:  Not on file    Physically abused: Not on file    Forced sexual activity: Not on file  Other Topics Concern  . Not on file  Social History Narrative   ** Merged History Encounter **         Review of Systems: General: negative for chills, fever, night sweats or weight changes.  Cardiovascular: negative for chest pain, dyspnea on exertion, edema, orthopnea, palpitations, paroxysmal nocturnal dyspnea or shortness of breath Dermatological: negative for rash Respiratory: negative for cough or wheezing Urologic: negative for hematuria Abdominal: negative for nausea, vomiting, diarrhea, bright red blood per rectum, melena, or hematemesis Neurologic: negative for visual changes, syncope, or dizziness All other systems reviewed and are otherwise negative except as noted above.    Blood pressure 109/63, pulse 74, height _0  (1.778 m), weight 215 lb 9.6 oz (  97.8 kg), SpO2 95 %.  General appearance: alert and no distress Neck: no adenopathy, no carotid bruit, no JVD, supple, symmetrical, trachea midline and thyroid not enlarged, symmetric, no tenderness/mass/nodules Lungs: clear to auscultation bilaterally Heart: regular rate and rhythm, S1, S2 normal, no murmur, click, rub or gallop Extremities: extremities normal, atraumatic, no cyanosis or edema Pulses: 2+ and symmetric Skin: Skin color, texture, turgor normal. No rashes or lesions Neurologic: Alert and oriented X 3, normal strength and tone. Normal symmetric reflexes. Normal coordination and gait  EKG not performed today  ASSESSMENT AND PLAN:   Claudication in peripheral vascular disease [facility]) Mr. Shahin returns today after his recent endovascular procedure performed 04/27/2018.  I performed diamondback orbital rotational atherectomy, PTA and stenting of a high-grade calcified right popliteal artery stenosis.  His Dopplers normalized and his claudication has resolved.  He is on dual antiplatelet therapy.  We will get Dopplers on him  semiannually and I will see him back in 1 year.      Lorretta Harp MD FACP,FACC,FAHA, [facility] 05/19/2018 11:42 AM

## 2018-05-19 NOTE — Assessment & Plan Note (Signed)
Matthew Chen returns today after his recent endovascular procedure performed 04/27/2018.  I performed diamondback orbital rotational atherectomy, PTA and stenting of a high-grade calcified right popliteal artery stenosis.  His Dopplers normalized and his claudication has resolved.  He is on dual antiplatelet therapy.  We will get Dopplers on him semiannually and I will see him back in 1 year.

## 2018-05-19 NOTE — Patient Instructions (Signed)
Medication Instructions:  Your physician recommends that you continue on your current medications as directed. Please refer to the Current Medication list given to you today.  If you need a refill on your cardiac medications before your next appointment, please call your pharmacy.   Lab work: none If you have labs (blood work) drawn today and your tests are completely normal, you will receive your results only by: Marland Kitchen MyChart Message (if you have MyChart) OR . A paper copy in the mail If you have any lab test that is abnormal or we need to change your treatment, we will call you to review the results.  Testing/Procedures: Your physician has requested that you have a lower extremity arterial exercise duplex. During this test, exercise and ultrasound are used to evaluate arterial blood flow in the legs. Allow one hour for this exam. There are no restrictions or special instructions. Your physician has requested that you have an ankle brachial index (ABI). During this test an ultrasound and blood pressure cuff are used to evaluate the arteries that supply the arms and legs with blood. Allow thirty minutes for this exam. There are no restrictions or special instructions.  SCHEDULE BOTH FOR April 2020  Follow-Up: At [facility], you and your health needs are our priority.  As part of our continuing mission to provide you with exceptional heart care, we have created designated Provider Care Teams.  These Care Teams include your primary Cardiologist (physician) and Advanced Practice Providers (APPs -  Physician Assistants and Nurse Practitioners) who all work together to provide you with the care you need, when you need it. You will need a follow up appointment in 12 months.  Please call our office 2 months in advance to schedule this appointment.  You may see Dr. Carney Bern or one of the following Advanced Practice Providers on your designated Care Team:   Kerin Ransom, [name] Roby Lofts,  [place] . Sande Rives, [name]  Any Other Special Instructions Will Be Listed Below (If Applicable).

## 2018-05-20 ENCOUNTER — Other Ambulatory Visit: Payer: Self-pay | Admitting: *Deleted

## 2018-05-20 DIAGNOSIS — I739 Peripheral vascular disease, unspecified: Secondary | ICD-10-CM

## 2018-05-25 ENCOUNTER — Other Ambulatory Visit: Payer: Self-pay | Admitting: Family Medicine

## 2018-05-25 ENCOUNTER — Other Ambulatory Visit: Payer: 59

## 2018-05-25 ENCOUNTER — Encounter: Payer: Self-pay | Admitting: *Deleted

## 2018-05-25 DIAGNOSIS — E119 Type 2 diabetes mellitus without complications: Secondary | ICD-10-CM

## 2018-05-25 DIAGNOSIS — G4700 Insomnia, unspecified: Secondary | ICD-10-CM

## 2018-05-25 DIAGNOSIS — I739 Peripheral vascular disease, unspecified: Secondary | ICD-10-CM

## 2018-05-25 NOTE — Progress Notes (Signed)
Patient came in today for Hemoglobin A1c. Informed patient that it has not been 90 days so if he gets it drawn it may not be covered by the insurance. Patient verbalized understanding and stated that he is fine with paying if insurance does not cover it.

## 2018-05-25 NOTE — Telephone Encounter (Signed)
Patient is asking for refill on her xanax to be sent to [facility]

## 2018-05-25 NOTE — Progress Notes (Signed)
This encounter was created in error - please disregard.

## 2018-05-26 ENCOUNTER — Encounter: Payer: Self-pay | Admitting: *Deleted

## 2018-05-26 LAB — HEMOGLOBIN A1C
EAG (MMOL/L): 7.7 (calc)
Hgb A1c MFr Bld: 6.5 % of total Hgb — ABNORMAL HIGH (ref <5.7)
MEAN PLASMA GLUCOSE: 140 (calc)

## 2018-05-26 MED ORDER — ALPRAZOLAM 1 MG PO TABS: 1.0000 mg | ORAL_TABLET | Freq: Every evening | ORAL | 0 refills | Status: DC | PRN

## 2018-05-26 NOTE — Telephone Encounter (Signed)
Pt is requesting refill on Xanax   LOV: 03/02/18  LRF:  06/03/17

## 2018-06-01 DIAGNOSIS — K5190 Ulcerative colitis, unspecified, without complications: Secondary | ICD-10-CM | POA: Diagnosis not present

## 2018-06-16 ENCOUNTER — Other Ambulatory Visit: Payer: Self-pay | Admitting: Cardiovascular Disease

## 2018-06-19 ENCOUNTER — Encounter: Payer: Self-pay | Admitting: Family Medicine

## 2018-06-19 ENCOUNTER — Ambulatory Visit (INDEPENDENT_AMBULATORY_CARE_PROVIDER_SITE_OTHER): Payer: 59 | Admitting: Family Medicine

## 2018-06-19 VITALS — BP 130/64 | HR 66 | Temp 97.8°F | Resp 16 | Ht 70.0 in | Wt 218.0 lb

## 2018-06-19 DIAGNOSIS — L989 Disorder of the skin and subcutaneous tissue, unspecified: Secondary | ICD-10-CM

## 2018-06-19 DIAGNOSIS — C4492 Squamous cell carcinoma of skin, unspecified: Secondary | ICD-10-CM | POA: Diagnosis not present

## 2018-06-19 NOTE — Progress Notes (Signed)
Subjective:    Patient ID: Matthew Chen, male    DOB: 1956/02/11, 62 y.o.   MRN: [id number]  HPI The patient reports a lesion growing on the crown of his head just slightly left of midline.  It is approximately a 6 mm dome-shaped pink pearly papule with a central ulceration with hyperkeratosis.  There also surface telangiectasias all concerning for a basal cell carcinoma.  It has been growing rapidly over the last 2 months.  It bleeds occasionally.  He is here today for evaluation Past Medical History:  Diagnosis Date  . Adenomatous colon polyp 03/2008  . Aortic aneurysm, thoracic ([place]) 09/09/2016   4.4 cm by echo 05/2015  . Arthritis    "joints in my fingers ache" (07/13/2015)  . Bicuspid aortic valve 09/09/2016  . CKD (chronic kidney disease), stage III ([place])   . Clotting disorder ([place])    on plavix for legs  . Diabetes mellitus without complication ([place])   . Hemorrhoids   . Hyperlipidemia   . Hypertension   . Peripheral arterial disease ([place])    a. dopppler 05/29/2015 revealed high-grade right popliteal and distal left SFA disease b. LE angio 06/28/2015 revealing high grade calcific dx with distal L SFA and R popliteal artery s/p diamondback orbital rotational atherectomy, PTA of L SFA  c. 07/13/2015 diamondback orbital rotational atherectomy and drug eluting balloon angioplasty of R popliteal artery (P2 segment) using distal protection   . Type II diabetes mellitus ([place])   . Ulcerative colitis    Past Surgical History:  Procedure Laterality Date  . ABDOMINAL AORTOGRAM W/LOWER EXTREMITY N/A 04/27/2018   Procedure: ABDOMINAL AORTOGRAM W/LOWER EXTREMITY;  Surgeon: Lorretta Harp, MD;  Location: [facility];  Service: Cardiovascular;  Laterality: N/A;  . COLONOSCOPY W/ POLYPECTOMY  X 4-5  . COSMETIC SURGERY  < 2000   "removed birthmark from top of my head"  . PERIPHERAL VASCULAR ATHERECTOMY  04/27/2018   Procedure: PERIPHERAL VASCULAR ATHERECTOMY;  Surgeon: Lorretta Harp,  MD;  Location: [facility];  Service: Cardiovascular;;  right popliteal artery  . PERIPHERAL VASCULAR CATHETERIZATION N/A 06/29/2015   Procedure: Lower Extremity Angiography;  Surgeon: Lorretta Harp, MD;  Location: [facility];  Service: Cardiovascular;  Laterality: N/A;  . PERIPHERAL VASCULAR CATHETERIZATION N/A 07/13/2015   Procedure: Lower Extremity Angiography;  Surgeon: Lorretta Harp, MD;  Location: [facility];  Service: Cardiovascular;  Laterality: N/A;  . PERIPHERAL VASCULAR CATHETERIZATION  07/13/2015   Procedure: Peripheral Vascular Atherectomy;  Surgeon: Lorretta Harp, MD;  Location: [facility];  Service: Cardiovascular;;  . PERIPHERAL VASCULAR INTERVENTION  04/27/2018   Procedure: PERIPHERAL VASCULAR INTERVENTION;  Surgeon: Lorretta Harp, MD;  Location: [facility];  Service: Cardiovascular;;  right popliteal artery  . TONSILLECTOMY     Current Outpatient Medications on File Prior to Visit  Medication Sig Dispense Refill  . ALPRAZolam (XANAX) 1 MG tablet Take 1 tablet (1 mg total) by mouth at bedtime as needed for anxiety or sleep. 20 tablet 0  . aspirin EC 81 MG tablet Take 1 tablet (81 mg total) by mouth daily. 90 tablet 3  . atorvastatin (LIPITOR) 40 MG tablet TAKE 1 TABLET BY MOUTH EVERY DAY 90 tablet 3  . blood glucose meter kit and supplies Dispense based on patient and insurance preference. Check BS daily. 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 Needs strips and lancets #100 /5 refills 1 each 0  . carvedilol (COREG) 25  MG tablet TAKE 1 TABLET BY MOUTH TWICE A DAY 60 tablet 11  . clopidogrel (PLAVIX) 75 MG tablet Take 1 tablet (75 mg total) by mouth daily with breakfast. 90 tablet 1  . dicyclomine (BENTYL) 10 MG capsule TAKE 1 TABLET BY MOUTH 3-4 TIMES DAILY AS NEEDED FOR URGENCY AND SPASMS. 90 capsule 2  . fenofibrate 160 MG tablet TAKE 1 TABLET BY MOUTH EVERY DAY 90 tablet 1  . glucose blood (ONE TOUCH ULTRA TEST) test strip Check BS bid 200 each 3   . Insulin Pen Needle (B-D UF III MINI PEN NEEDLES) 31G X 5 MM MISC USE DAILY WITH VICTOZA 100 each 0  . linagliptin (TRADJENTA) 5 MG TABS tablet Take 1 tablet (5 mg total) by mouth daily. 90 tablet 1  . lisinopril-hydrochlorothiazide (PRINZIDE,ZESTORETIC) 20-25 MG tablet TAKE 1 TABLET BY MOUTH EVERY DAY 90 tablet 3  . mesalamine (LIALDA) 1.2 g EC tablet Take 2 tablets (2.4 g total) by mouth 2 (two) times daily. 120 tablet 6  . metFORMIN (GLUCOPHAGE) 1000 MG tablet TAKE 1 TABLET BY MOUTH TWICE A DAY WITH MEALS 180 tablet 1  . mupirocin cream (BACTROBAN) 2 % Apply 1 application topically 2 (two) times daily. (Patient taking differently: Apply 1 application topically daily as needed (skin irritation). ) 15 g 0  . sildenafil (VIAGRA) 100 MG tablet Take 0.5-1 tablets (50-100 mg total) daily as needed by mouth for erectile dysfunction. 10 tablet 11  . vedolizumab (ENTYVIO) 300 MG injection Inject 300 mg into the vein every 8 (eight) weeks.     Marland Kitchen VICTOZA 18 MG/3ML SOPN INJECT 0.2 MLS (1.2 MG TOTAL) DAILY INTO THE SKIN. (Patient taking differently: Inject 1.2 mg into the skin daily. ) 3 pen 1   No current facility-administered medications on file prior to visit.    Allergies  Allergen Reactions  . Penicillins Other (See Comments)    Unsure, childhood reaction Has patient had a PCN reaction causing immediate rash, facial/tongue/throat swelling, SOB or lightheadedness with hypotension: Unknown Has patient had a PCN reaction causing severe rash involving mucus membranes or skin necrosis: Unknown Has patient had a PCN reaction that required hospitalization: No Has patient had a PCN reaction occurring within the last 10 years: No If all of the above answers are "NO", then may proceed with Cephalosporin use.    Social History   Socioeconomic History  . Marital status: Married    Spouse name: Not on file  . Number of children: 2  . Years of education: Not on file  . Highest education level: Not on  file  Occupational History  . Occupation: Truck Education administrator: Chief Financial Officer  Social Needs  . Financial resource strain: Not on file  . Food insecurity:    Worry: Not on file    Inability: Not on file  . Transportation needs:    Medical: Not on file    Non-medical: Not on file  Tobacco Use  . Smoking status: Former Smoker    Packs/day: 1.50    Years: 25.00    Pack years: 37.50    Types: Cigarettes    Last attempt to quit: 08/12/1996    Years since quitting: 21.8  . Smokeless tobacco: Never Used  Substance and Sexual Activity  . Alcohol use: Yes    Alcohol/week: 1.0 standard drinks    Types: 1 Cans of beer per week    Comment: social use  . Drug use: No  . Sexual activity:  Yes  Lifestyle  . Physical activity:    Days per week: Not on file    Minutes per session: Not on file  . Stress: Not on file  Relationships  . Social connections:    Talks on phone: Not on file    Gets together: Not on file    Attends religious service: Not on file    Active member of club or organization: Not on file    Attends meetings of clubs or organizations: Not on file    Relationship status: Not on file  . Intimate partner violence:    Fear of current or ex partner: Not on file    Emotionally abused: Not on file    Physically abused: Not on file    Forced sexual activity: Not on file  Other Topics Concern  . Not on file  Social History Narrative   ** Merged History Encounter **          Review of Systems  All other systems reviewed and are negative.      Objective:   Physical Exam  HENT:  Head:    Cardiovascular: Normal rate, regular rhythm and normal heart sounds.  Pulmonary/Chest: Effort normal and breath sounds normal.  Vitals reviewed.   6 mm papule as described in the history of present illness      Assessment & Plan:  Skin lesion of scalp - Plan: Pathology  This is most certainly a basal cell carcinoma.  I obtained informed consent to  perform a shave biopsy.  I anesthetized lesion with 0.1% lidocaine with epinephrine.  I performed a shave biopsy using sterile technique and sent the lesion to pathology in a labeled container.  Hemostasis was achieved using a combination of Drysol and silver nitrate along with a dressing.  Await the pathology results.  Patient will likely require referral to dermatology/Mohs surgery

## 2018-06-23 LAB — TISSUE SPECIMEN

## 2018-06-23 LAB — PATHOLOGY

## 2018-06-24 ENCOUNTER — Telehealth: Payer: Self-pay | Admitting: Family Medicine

## 2018-06-24 DIAGNOSIS — L989 Disorder of the skin and subcutaneous tissue, unspecified: Secondary | ICD-10-CM

## 2018-06-24 NOTE — Telephone Encounter (Signed)
Patient calling to say he saw the results in mychart for his biopsy, would like to talk to you about the results  [phone]

## 2018-06-29 NOTE — Telephone Encounter (Signed)
Pt aware of results and apt with [facility] pending

## 2018-07-01 ENCOUNTER — Other Ambulatory Visit: Payer: Self-pay | Admitting: Physician Assistant

## 2018-07-16 ENCOUNTER — Telehealth: Payer: Self-pay | Admitting: Family Medicine

## 2018-07-16 NOTE — Telephone Encounter (Signed)
I have called and spoke with [facility] she stated that they were still waiting on the pathology report. I have faxed it again to her she finally received all of the document. She has given to the procedure schedulers she states that the should reach out to patient no later than Tuesday 07/21/18.   I have called and updated patient. I have also said that I would call them on Monday if I didn't see where they had already contacted patient for an appointment. If they haven't I will contact them again.

## 2018-07-16 NOTE — Telephone Encounter (Signed)
Patient has question about his dermatology referral  [phone]

## 2018-07-16 NOTE — Telephone Encounter (Signed)
Left vm for patient to return my call.

## 2018-07-16 NOTE — Telephone Encounter (Signed)
Patient returned my call states he still hasn't heard from the skin center about referral. He states he is off on Monday and Tuesday's and will be headed out of town on 08/06/18 returning after the New Year. I will call and see if I can get an update on this referral for patient.

## 2018-07-27 DIAGNOSIS — C4442 Squamous cell carcinoma of skin of scalp and neck: Secondary | ICD-10-CM | POA: Diagnosis not present

## 2018-07-27 DIAGNOSIS — L905 Scar conditions and fibrosis of skin: Secondary | ICD-10-CM | POA: Diagnosis not present

## 2018-07-27 DIAGNOSIS — D485 Neoplasm of uncertain behavior of skin: Secondary | ICD-10-CM | POA: Diagnosis not present

## 2018-07-28 DIAGNOSIS — K5190 Ulcerative colitis, unspecified, without complications: Secondary | ICD-10-CM | POA: Diagnosis not present

## 2018-08-03 ENCOUNTER — Other Ambulatory Visit: Payer: Self-pay | Admitting: Family Medicine

## 2018-08-18 DIAGNOSIS — L570 Actinic keratosis: Secondary | ICD-10-CM | POA: Diagnosis not present

## 2018-08-18 DIAGNOSIS — D044 Carcinoma in situ of skin of scalp and neck: Secondary | ICD-10-CM | POA: Diagnosis not present

## 2018-08-29 ENCOUNTER — Other Ambulatory Visit: Payer: Self-pay | Admitting: Cardiovascular Disease

## 2018-08-31 ENCOUNTER — Other Ambulatory Visit: Payer: Self-pay | Admitting: Family Medicine

## 2018-09-01 NOTE — Telephone Encounter (Addendum)
Spoke with patient and he stated he has been taking Amlodipine since Dr Blenda Mounts last office visit. Will forward to Dr Oval Linsey for review   Patient does have follow up scheduled 09/21/18

## 2018-09-02 NOTE — Telephone Encounter (Signed)
Discussed with Dr Oval Linsey and ok to fill, Rx sent to pharmacy as requested

## 2018-09-14 ENCOUNTER — Other Ambulatory Visit: Payer: Self-pay | Admitting: Cardiovascular Disease

## 2018-09-14 NOTE — Telephone Encounter (Signed)
Rx request sent to pharmacy.  

## 2018-09-17 ENCOUNTER — Other Ambulatory Visit: Payer: Self-pay | Admitting: Family Medicine

## 2018-09-21 ENCOUNTER — Encounter: Payer: Self-pay | Admitting: Cardiology

## 2018-09-21 ENCOUNTER — Ambulatory Visit (INDEPENDENT_AMBULATORY_CARE_PROVIDER_SITE_OTHER): Payer: 59 | Admitting: Cardiology

## 2018-09-21 VITALS — BP 116/62 | HR 59 | Ht 71.0 in | Wt 219.4 lb

## 2018-09-21 DIAGNOSIS — N183 Chronic kidney disease, stage 3 unspecified: Secondary | ICD-10-CM

## 2018-09-21 DIAGNOSIS — I10 Essential (primary) hypertension: Secondary | ICD-10-CM | POA: Diagnosis not present

## 2018-09-21 DIAGNOSIS — Q231 Congenital insufficiency of aortic valve: Secondary | ICD-10-CM | POA: Diagnosis not present

## 2018-09-21 DIAGNOSIS — Z794 Long term (current) use of insulin: Secondary | ICD-10-CM

## 2018-09-21 DIAGNOSIS — I739 Peripheral vascular disease, unspecified: Secondary | ICD-10-CM

## 2018-09-21 DIAGNOSIS — E118 Type 2 diabetes mellitus with unspecified complications: Secondary | ICD-10-CM

## 2018-09-21 DIAGNOSIS — E785 Hyperlipidemia, unspecified: Secondary | ICD-10-CM

## 2018-09-21 NOTE — Assessment & Plan Note (Signed)
Nov 2016- LSFA PTA Dec 2016- staged Rt popliteal PTA Sept 2019- re do PTA Rt popliteal artery

## 2018-09-21 NOTE — Assessment & Plan Note (Signed)
LDL 49 July 2019

## 2018-09-21 NOTE — Patient Instructions (Signed)
Medication Instructions:  Your physician recommends that you continue on your current medications as directed. Please refer to the Current Medication list given to you today. If you need a refill on your cardiac medications before your next appointment, please call your pharmacy.   Lab work: none If you have labs (blood work) drawn today and your tests are completely normal, you will receive your results only by: Marland Kitchen MyChart Message (if you have MyChart) OR . A paper copy in the mail If you have any lab test that is abnormal or we need to change your treatment, we will call you to review the results.  Testing/Procedures: none  Follow-Up: At [facility], you and your health needs are our priority.  As part of our continuing mission to provide you with exceptional heart care, we have created designated Provider Care Teams.  These Care Teams include your primary Cardiologist (physician) and Advanced Practice Providers (APPs -  Physician Assistants and Nurse Practitioners) who all work together to provide you with the care you need, when you need it. . You will need a follow up appointment in 6 months.  Please call our office 2 months in advance to schedule this appointment.  You may see [name] or one of the following Advanced Practice Providers on your designated Care Team:   . Kerin Ransom, [place] . Almyra Deforest, [name] . Fabian Sharp, [name] . Jory Sims, DNP . Rosaria Ferries, [name] . Roby Lofts, [name] . Sande Rives, [name]

## 2018-09-21 NOTE — Progress Notes (Signed)
09/21/2018 Matthew Chen   20-Mar-1956  [id number]  Primary Physician Matthew Chen, Matthew Mcgee, MD Primary Cardiologist:Matthew Chen  HPI:  Matthew Chen is a 63 y/o male, works as a Corporate investment banker, followed by Matthew Chen with a history of bicuspid AOV with mild AS. His echo in Feb 2018 showed calcification of the aortic valve but no significant aortic stenosis. He also has PVD and is s/p LSFA PTA Nov 2016 followed by staged Rt popliteal PTA Dec 2016.  He developed recurrent claudication in Sept 2019 and on 04/26/18 underwent diamondback orbital rotational atherectomy and DES placement to Rt popliteal artery.  Follow up dopplers 05/19/18 showed patent Rt popliteal stent with no evidence of restenosis. Follow up dopplers 05/19/18 showed marked improvement  compared to previous study in Aug 2019.   Other medical problems include NIDDM, CRI-3, HTN, HLD, and UC (Matthew Chen follows).  He is in the office today for 6 month f/u.  He denies any claudication.  He denies any chest pain or unusual dyspnea.  He has gotten off his usual exercise routine (walking) but is planning on resuming.    Current Outpatient Medications  Medication Sig Dispense Refill  . ALPRAZolam (XANAX) 1 MG tablet Take 1 tablet (1 mg total) by mouth at bedtime as needed for anxiety or sleep. 20 tablet 0  . amLODipine (NORVASC) 10 MG tablet TAKE 1 TABLET BY MOUTH EVERY DAY 90 tablet 3  . aspirin EC 81 MG tablet Take 1 tablet (81 mg total) by mouth daily. 90 tablet 3  . atorvastatin (LIPITOR) 40 MG tablet TAKE 1 TABLET BY MOUTH EVERY DAY 90 tablet 3  . blood glucose meter kit and supplies Dispense based on patient and insurance preference. Check BS daily. 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 Needs strips and lancets #100 /5 refills 1 each 0  . carvedilol (COREG) 25 MG tablet TAKE 1 TABLET BY MOUTH TWICE A DAY 180 tablet 0  . clopidogrel (PLAVIX) 75 MG tablet Take 1 tablet (75 mg total) by mouth daily with breakfast. 90 tablet 1  . dicyclomine (BENTYL) 10 MG  capsule TAKE 1 TABLET BY MOUTH 3-4 TIMES DAILY AS NEEDED FOR URGENCY AND SPASMS. 90 capsule 2  . fenofibrate 160 MG tablet TAKE 1 TABLET BY MOUTH EVERY DAY 90 tablet 1  . glucose blood (ONE TOUCH ULTRA TEST) test strip Check BS bid 200 each 3  . Insulin Pen Needle (B-D UF III MINI PEN NEEDLES) 31G X 5 MM MISC USE DAILY WITH VICTOZA 100 each 0  . liraglutide (VICTOZA) 18 MG/3ML SOPN Inject 0.2 mLs (1.2 mg total) into the skin daily. 18 mL 3  . lisinopril-hydrochlorothiazide (PRINZIDE,ZESTORETIC) 20-25 MG tablet TAKE 1 TABLET BY MOUTH EVERY DAY 90 tablet 0  . mesalamine (LIALDA) 1.2 g EC tablet Take 2 tablets (2.4 g total) by mouth 2 (two) times daily. 120 tablet 6  . metFORMIN (GLUCOPHAGE) 1000 MG tablet TAKE 1 TABLET BY MOUTH TWICE A DAY WITH MEALS 180 tablet 1  . mupirocin cream (BACTROBAN) 2 % Apply 1 application topically 2 (two) times daily. (Patient taking differently: Apply 1 application topically daily as needed (skin irritation). ) 15 g 0  . sildenafil (VIAGRA) 100 MG tablet TAKE 1/2 OR 1 TABLET BY MOUTH DAILY AS NEEDED FOR ERECTILE DYSFUNCTION 3 tablet 39  . TRADJENTA 5 MG TABS tablet TAKE 1 TABLET BY MOUTH EVERY DAY 90 tablet 1  . vedolizumab (ENTYVIO) 300 MG injection Inject 300 mg into the vein every 8 (  eight) weeks.      No current facility-administered medications for this visit.     Allergies  Allergen Reactions  . Penicillins Other (See Comments)    Unsure, childhood reaction Has patient had a PCN reaction causing immediate rash, facial/tongue/throat swelling, SOB or lightheadedness with hypotension: Unknown Has patient had a PCN reaction causing severe rash involving mucus membranes or skin necrosis: Unknown Has patient had a PCN reaction that required hospitalization: No Has patient had a PCN reaction occurring within the last 10 years: No If all of the above answers are "NO", then may proceed with Cephalosporin use.     Past Medical History:  Diagnosis Date  .  Adenomatous colon polyp 03/2008  . Aortic aneurysm, thoracic ([place]) 09/09/2016   4.4 cm by echo 05/2015  . Arthritis    "joints in my fingers ache" (07/13/2015)  . Bicuspid aortic valve 09/09/2016  . CKD (chronic kidney disease), stage III ([place])   . Clotting disorder ([place])    on plavix for legs  . Diabetes mellitus without complication ([place])   . Hemorrhoids   . Hyperlipidemia   . Hypertension   . Peripheral arterial disease ([place])    a. dopppler 05/29/2015 revealed high-grade right popliteal and distal left SFA disease b. LE angio 06/28/2015 revealing high grade calcific dx with distal L SFA and R popliteal artery s/p diamondback orbital rotational atherectomy, PTA of L SFA  c. 07/13/2015 diamondback orbital rotational atherectomy and drug eluting balloon angioplasty of R popliteal artery (P2 segment) using distal protection   . Type II diabetes mellitus ([place])   . Ulcerative colitis     Social History   Socioeconomic History  . Marital status: Married    Spouse name: Not on file  . Number of children: 2  . Years of education: Not on file  . Highest education level: Not on file  Occupational History  . Occupation: Truck Education administrator: Chief Financial Officer  Social Needs  . Financial resource strain: Not on file  . Food insecurity:    Worry: Not on file    Inability: Not on file  . Transportation needs:    Medical: Not on file    Non-medical: Not on file  Tobacco Use  . Smoking status: Former Smoker    Packs/day: 1.50    Years: 25.00    Pack years: 37.50    Types: Cigarettes    Last attempt to quit: 08/12/1996    Years since quitting: 22.1  . Smokeless tobacco: Never Used  Substance and Sexual Activity  . Alcohol use: Yes    Alcohol/week: 1.0 standard drinks    Types: 1 Cans of beer per week    Comment: social use  . Drug use: No  . Sexual activity: Yes  Lifestyle  . Physical activity:    Days per week: Not on file    Minutes per session: Not on file  .  Stress: Not on file  Relationships  . Social connections:    Talks on phone: Not on file    Gets together: Not on file    Attends religious service: Not on file    Active member of club or organization: Not on file    Attends meetings of clubs or organizations: Not on file    Relationship status: Not on file  . Intimate partner violence:    Fear of current or ex partner: Not on file    Emotionally abused: Not on file  Physically abused: Not on file    Forced sexual activity: Not on file  Other Topics Concern  . Not on file  Social History Narrative   ** Merged History Encounter **         Family History  Problem Relation Age of Onset  . Colon cancer Maternal Grandmother        in her 81's  . Colon polyps Mother        in her 64's  . Diabetes Mother   . Heart disease Father   . Diabetes Maternal Aunt        Aunts and Uncles  . Esophageal cancer Neg Hx   . Stomach cancer Neg Hx   . Rectal cancer Neg Hx      Review of Systems: General: negative for chills, fever, night sweats or weight changes.  Cardiovascular: negative for chest pain, dyspnea on exertion, edema, orthopnea, palpitations, paroxysmal nocturnal dyspnea or shortness of breath Dermatological: negative for rash Respiratory: negative for cough or wheezing Urologic: negative for hematuria Abdominal: negative for nausea, vomiting, diarrhea, bright red blood per rectum, melena, or hematemesis Neurologic: negative for visual changes, syncope, or dizziness No history of snoring or sleep apnea All other systems reviewed and are otherwise negative except as noted above.    Blood pressure 116/62, pulse (!) 59, height 5' 11"  (1.803 m), weight 219 lb 6.4 oz (99.5 kg).  General appearance: alert, cooperative and no distress Lungs: clear to auscultation bilaterally Heart: regular rate and rhythm and 2/6 systolic murmur AOV- preserved S2 Extremities: no edema Skin: Skin color, texture, turgor normal. No rashes or  lesions Neurologic: Grossly normal  EKG [id number]  ASSESSMENT AND Chen:   Peripheral arterial disease [facility]) Nov 2016- LSFA PTA Dec 2016- staged Rt popliteal PTA Sept 2019- re do PTA Rt popliteal artery  Dyslipidemia, goal LDL below 70 LDL 49 July 2019  Essential hypertension Controlled  CKD (chronic kidney disease), stage III (HCC) GFR 57  Bicuspid aortic valve Mild AS, normal LVF, grade 2 DD by echo Sept 2019   Chen  Pt is doing well- encouraged to resume walking program.  F/U with Matthew Chen in 6 months.   Kerin Ransom [name] 09/21/2018 8:36 AM

## 2018-09-21 NOTE — Assessment & Plan Note (Signed)
Mild AS, normal LVF, grade 2 DD by echo Sept 2019

## 2018-09-21 NOTE — Assessment & Plan Note (Signed)
Controlled.  

## 2018-09-21 NOTE — Assessment & Plan Note (Signed)
GFR 57

## 2018-09-22 DIAGNOSIS — K5190 Ulcerative colitis, unspecified, without complications: Secondary | ICD-10-CM | POA: Diagnosis not present

## 2018-09-30 ENCOUNTER — Other Ambulatory Visit: Payer: Self-pay | Admitting: Family Medicine

## 2018-10-13 ENCOUNTER — Other Ambulatory Visit: Payer: Self-pay | Admitting: *Deleted

## 2018-10-13 MED ORDER — FENOFIBRATE 160 MG PO TABS: 160.0000 mg | ORAL_TABLET | Freq: Every day | ORAL | 1 refills | Status: DC

## 2018-10-14 ENCOUNTER — Other Ambulatory Visit: Payer: Self-pay | Admitting: Physician Assistant

## 2018-10-19 ENCOUNTER — Other Ambulatory Visit (HOSPITAL_COMMUNITY): Payer: Self-pay | Admitting: Cardiology

## 2018-10-28 ENCOUNTER — Other Ambulatory Visit: Payer: Self-pay | Admitting: Physician Assistant

## 2018-10-28 NOTE — Telephone Encounter (Signed)
I have spoken with Dr Fuller Plan regarding refill of mesalamine for patient. He requested at last office visit 04/2018 that patient use Entyvio and discontinue budesonide and mesalamine with 3 month follow up in office.  I contacted patient to find out exactly what course of action he has taken since his last visit with [place]. Patient indicates that he is still getting En[facility]vio at [facility] every 8 weeks and that he did discontinue budesonide as requested at his last office visit. He never did discontinue mesalamine as requested though.   Patient states that he has been doing well with normal bowel movements until yesterday when he started noticing loose bowel movements with some blood in them. I advised that he should watch this for the next 1-2 days and let us know right away should this continue or worsen as he could be having a flare that needs to be addressed. Otherwise, he can discontinue mesalamine and should follow up with [place] in office in the next 1-2 months once the COVID-19 virus has spread throughout our community has subsided a bit. Patient verbalizes understanding of this.

## 2018-11-05 ENCOUNTER — Other Ambulatory Visit: Payer: Self-pay | Admitting: *Deleted

## 2018-11-05 MED ORDER — MESALAMINE 1.2 G PO TBEC: 2.4000 g | DELAYED_RELEASE_TABLET | Freq: Two times a day (BID) | ORAL | 0 refills | Status: DC

## 2018-11-06 ENCOUNTER — Encounter: Payer: Self-pay | Admitting: Family Medicine

## 2018-11-06 DIAGNOSIS — L814 Other melanin hyperpigmentation: Secondary | ICD-10-CM | POA: Diagnosis not present

## 2018-11-06 DIAGNOSIS — L821 Other seborrheic keratosis: Secondary | ICD-10-CM | POA: Diagnosis not present

## 2018-11-06 DIAGNOSIS — C44519 Basal cell carcinoma of skin of other part of trunk: Secondary | ICD-10-CM | POA: Diagnosis not present

## 2018-11-06 DIAGNOSIS — L300 Nummular dermatitis: Secondary | ICD-10-CM | POA: Diagnosis not present

## 2018-11-10 DIAGNOSIS — K5190 Ulcerative colitis, unspecified, without complications: Secondary | ICD-10-CM | POA: Diagnosis not present

## 2018-11-10 DIAGNOSIS — I10 Essential (primary) hypertension: Secondary | ICD-10-CM | POA: Diagnosis not present

## 2018-11-10 DIAGNOSIS — Z79899 Other long term (current) drug therapy: Secondary | ICD-10-CM | POA: Diagnosis not present

## 2018-11-17 DIAGNOSIS — K5190 Ulcerative colitis, unspecified, without complications: Secondary | ICD-10-CM | POA: Diagnosis not present

## 2018-11-23 ENCOUNTER — Ambulatory Visit (HOSPITAL_COMMUNITY)
Admission: RE | Admit: 2018-11-23 | Payer: 59 | Source: Ambulatory Visit | Attending: Cardiovascular Disease | Admitting: Cardiovascular Disease

## 2018-11-24 ENCOUNTER — Other Ambulatory Visit: Payer: Self-pay

## 2018-11-24 ENCOUNTER — Ambulatory Visit (HOSPITAL_COMMUNITY)
Admission: RE | Admit: 2018-11-24 | Discharge: 2018-11-24 | Disposition: A | Discharge status: Home / Self Care | Payer: 59 | Source: Ambulatory Visit | Attending: Cardiology | Admitting: Cardiology

## 2018-11-24 DIAGNOSIS — I739 Peripheral vascular disease, unspecified: Secondary | ICD-10-CM

## 2018-12-04 ENCOUNTER — Other Ambulatory Visit: Payer: Self-pay | Admitting: Gastroenterology

## 2018-12-09 ENCOUNTER — Other Ambulatory Visit: Payer: Self-pay

## 2018-12-09 DIAGNOSIS — I739 Peripheral vascular disease, unspecified: Secondary | ICD-10-CM

## 2018-12-11 ENCOUNTER — Other Ambulatory Visit: Payer: Self-pay | Admitting: Cardiovascular Disease

## 2018-12-11 ENCOUNTER — Other Ambulatory Visit: Payer: Self-pay

## 2018-12-11 MED ORDER — MESALAMINE 1.2 G PO TBEC: 2.4000 g | DELAYED_RELEASE_TABLET | Freq: Two times a day (BID) | ORAL | 0 refills | Status: DC

## 2018-12-11 NOTE — Telephone Encounter (Signed)
Carvedilol and lisinopril-hct refilled.

## 2018-12-22 ENCOUNTER — Other Ambulatory Visit: Payer: Self-pay

## 2018-12-22 DIAGNOSIS — K51319 Ulcerative (chronic) rectosigmoiditis with unspecified complications: Secondary | ICD-10-CM

## 2019-01-18 ENCOUNTER — Other Ambulatory Visit: Payer: Self-pay

## 2019-01-18 ENCOUNTER — Other Ambulatory Visit: Payer: 59

## 2019-01-18 DIAGNOSIS — E119 Type 2 diabetes mellitus without complications: Secondary | ICD-10-CM

## 2019-01-18 DIAGNOSIS — E7800 Pure hypercholesterolemia, unspecified: Secondary | ICD-10-CM

## 2019-01-18 DIAGNOSIS — E785 Hyperlipidemia, unspecified: Secondary | ICD-10-CM

## 2019-01-18 DIAGNOSIS — I10 Essential (primary) hypertension: Secondary | ICD-10-CM

## 2019-01-19 LAB — HEMOGLOBIN A1C
Hgb A1c MFr Bld: 7.6 % of total Hgb — ABNORMAL HIGH (ref <5.7)
Mean Plasma Glucose: 171 (calc)
eAG (mmol/L): 9.5 (calc)

## 2019-01-19 LAB — CBC WITH DIFFERENTIAL/PLATELET
Absolute Monocytes: 714 cells/uL (ref 200–950)
Basophils Absolute: 51 cells/uL (ref 0–200)
Basophils Relative: 1 %
Eosinophils Absolute: 230 cells/uL (ref 15–500)
Eosinophils Relative: 4.5 %
HCT: 38 % — ABNORMAL LOW (ref 38.5–50.0)
Hemoglobin: 12.3 g/dL — ABNORMAL LOW (ref 13.2–17.1)
Lymphs Abs: 1464 cells/uL (ref 850–3900)
MCH: 28.8 pg (ref 27.0–33.0)
MCHC: 32.4 g/dL (ref 32.0–36.0)
MCV: 89 fL (ref 80.0–100.0)
MPV: 11 fL (ref 7.5–12.5)
Monocytes Relative: 14 %
Neutro Abs: 2642 cells/uL (ref 1500–7800)
Neutrophils Relative %: 51.8 %
Platelets: 291 10*3/uL (ref 140–400)
RBC: 4.27 10*6/uL (ref 4.20–5.80)
RDW: 13.8 % (ref 11.0–15.0)
Total Lymphocyte: 28.7 %
WBC: 5.1 10*3/uL (ref 3.8–10.8)

## 2019-01-19 LAB — LIPID PANEL
Cholesterol: 113 mg/dL (ref <200)
HDL: 26 mg/dL — ABNORMAL LOW (ref >=40)
LDL Cholesterol (Calc): 52 mg/dL (calc)
Non-HDL Cholesterol (Calc): 87 mg/dL (calc) (ref <130)
Total CHOL/HDL Ratio: 4.3 (calc) (ref <5.0)
Triglycerides: 329 mg/dL — ABNORMAL HIGH (ref <150)

## 2019-01-19 LAB — COMPREHENSIVE METABOLIC PANEL
AG Ratio: 1.5 (calc) (ref 1.0–2.5)
ALT: 25 U/L (ref 9–46)
AST: 21 U/L (ref 10–35)
Albumin: 4 g/dL (ref 3.6–5.1)
Alkaline phosphatase (APISO): 58 U/L (ref 35–144)
BUN/Creatinine Ratio: 17 (calc) (ref 6–22)
BUN: 26 mg/dL — ABNORMAL HIGH (ref 7–25)
CO2: 23 mmol/L (ref 20–32)
Calcium: 9.4 mg/dL (ref 8.6–10.3)
Chloride: 105 mmol/L (ref 98–110)
Creat: 1.53 mg/dL — ABNORMAL HIGH (ref 0.70–1.25)
Globulin: 2.7 g/dL (calc) (ref 1.9–3.7)
Glucose, Bld: 151 mg/dL — ABNORMAL HIGH (ref 65–99)
Potassium: 4.7 mmol/L (ref 3.5–5.3)
Sodium: 137 mmol/L (ref 135–146)
Total Bilirubin: 0.4 mg/dL (ref 0.2–1.2)
Total Protein: 6.7 g/dL (ref 6.1–8.1)

## 2019-02-15 ENCOUNTER — Encounter: Payer: Self-pay | Admitting: Family Medicine

## 2019-02-15 ENCOUNTER — Ambulatory Visit (INDEPENDENT_AMBULATORY_CARE_PROVIDER_SITE_OTHER): Payer: 59 | Admitting: Family Medicine

## 2019-02-15 ENCOUNTER — Other Ambulatory Visit: Payer: Self-pay

## 2019-02-15 VITALS — BP 128/80 | HR 62 | Temp 98.2°F | Resp 16 | Ht 70.0 in | Wt 217.0 lb

## 2019-02-15 DIAGNOSIS — I10 Essential (primary) hypertension: Secondary | ICD-10-CM

## 2019-02-15 DIAGNOSIS — N183 Chronic kidney disease, stage 3 unspecified: Secondary | ICD-10-CM

## 2019-02-15 DIAGNOSIS — E785 Hyperlipidemia, unspecified: Secondary | ICD-10-CM | POA: Diagnosis not present

## 2019-02-15 DIAGNOSIS — E1169 Type 2 diabetes mellitus with other specified complication: Secondary | ICD-10-CM | POA: Diagnosis not present

## 2019-02-15 MED ORDER — DICLOFENAC SODIUM 1 % TD GEL: 2.0000 g | Freq: Four times a day (QID) | TRANSDERMAL | 0 refills | Status: DC

## 2019-02-15 MED ORDER — PIOGLITAZONE HCL 30 MG PO TABS: 30.0000 mg | ORAL_TABLET | Freq: Every day | ORAL | 3 refills | Status: DC

## 2019-02-15 NOTE — Progress Notes (Signed)
 Subjective:    Patient ID: Matthew Chen, male    DOB: 11/16/1955, 63 y.o.   MRN: [id number]  Medication Refill  Patient is here today for follow-up of his chronic medical problems.  Patient has stage III chronic kidney disease, type 2 diabetes mellitus with a history of peripheral vascular disease requiring a stent in his right leg for which he is on dual antiplatelet therapy, essential hypertension, and dyslipidemia with hypertriglyceridemia.  Since I last saw the patient, due to the chronic quarantine, he has been exercising less.  As result his hemoglobin A1c has risen to 7.6.  During the same time interval, his creatinine has risen from 1.36-1.53 indicating a decline in his renal function.  His blood pressure today is well controlled.  He denies any chest pain shortness of breath or dyspnea on exertion.  He denies any myalgias or right upper quadrant pain.  He is consistently taking his fenofibrate along with his fish oil however his triglycerides are still greater than 300.  He is on Tradjenta, Victoza, and metformin for diabetes.  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  I believe his risk for lactic acidosis is minimal at the current level of his renal function however I am concerned that if his renal function worsens he will be able to take metformin.  Also worried that the Tradjenta is doing very little for the patient given the fact he is on Victoza. Lab on 01/18/2019  Component Date Value Ref Range Status  . Hgb A1c MFr Bld 01/18/2019 7.6* <5.7 % of total Hgb Final   Comment: For someone without known diabetes, a hemoglobin A1c value of 6.5% or greater indicates that they may have  diabetes and this should be confirmed with a follow-up  test. . For someone with known diabetes, a value <7% indicates  that their diabetes is well controlled and a value  greater than or equal to 7% indicates suboptimal  control. A1c targets should be  individualized based on  duration of diabetes, age, comorbid conditions, and  other considerations. . Currently, no consensus exists regarding use of hemoglobin A1c for diagnosis of diabetes for children. .   . Mean Plasma Glucose 01/18/2019 171  (calc) Final  . eAG (mmol/L) 01/18/2019 9.5  (calc) Final  . WBC 01/18/2019 5.1  3.8 - 10.8 Thousand/uL Final  . RBC 01/18/2019 4.27  4.20 - 5.80 Million/uL Final  . Hemoglobin 01/18/2019 12.3* 13.2 - 17.1 g/dL Final  . HCT 01/18/2019 38.0* 38.5 - 50.0 % Final  . MCV 01/18/2019 89.0  80.0 - 100.0 fL Final  . MCH 01/18/2019 28.8  27.0 - 33.0 pg Final  . MCHC 01/18/2019 32.4  32.0 - 36.0 g/dL Final  . RDW 01/18/2019 13.8  11.0 - 15.0 % Final  . Platelets 01/18/2019 291  140 - 400 Thousand/uL Final  . MPV 01/18/2019 11.0  7.5 - 12.5 fL Final  . Neutro Abs 01/18/2019 2,642  1,500 - 7,800 cells/uL Final  . Lymphs Abs 01/18/2019 1,464  850 - 3,900 cells/uL Final  . Absolute Monocytes 01/18/2019 714  200 - 950 cells/uL Final  . Eosinophils Absolute 01/18/2019 230  15 - 500 cells/uL Final  . Basophils Absolute 01/18/2019 51  0 - 200 cells/uL Final  . Neutrophils Relative % 01/18/2019 51.8  % Final  . Total Lymphocyte 01/18/2019 28.7  % Final  . Monocytes Relative 01/18/2019 14.0  % Final  . Eosinophils Relative   01/18/2019 4.5  % Final  . Basophils Relative 01/18/2019 1.0  % Final  . Cholesterol 01/18/2019 113  <200 mg/dL Final  . HDL 01/18/2019 26* > OR = 40 mg/dL Final  . Triglycerides 01/18/2019 329* <150 mg/dL Final   Comment: . If a non-fasting specimen was collected, consider repeat triglyceride testing on a fasting specimen if clinically indicated.  Jacobson et al. J. of Clin. Lipidol. 2015;9:129-169. .   . LDL Cholesterol (Calc) 01/18/2019 52  mg/dL (calc) Final   Comment: Reference range: <100 . Desirable range <100 mg/dL for primary prevention;   <70 mg/dL for patients with CHD or diabetic patients  with > or = 2 CHD risk  factors. . LDL-C is now calculated using the Martin-Hopkins  calculation, which is a validated novel method providing  better accuracy than the Friedewald equation in the  estimation of LDL-C.  Martin SS et al. JAMA. 2013;310(19): 2061-2068  (http://education.[facility].com/faq/FAQ164)   . Total CHOL/HDL Ratio 01/18/2019 4.3  <5.0 (calc) Final  . Non-HDL Cholesterol (Calc) 01/18/2019 87  <130 mg/dL (calc) Final   Comment: For patients with diabetes plus 1 major ASCVD risk  factor, treating to a non-HDL-C goal of <100 mg/dL  (LDL-C of <70 mg/dL) is considered a therapeutic  option.   . Glucose, Bld 01/18/2019 151* 65 - 99 mg/dL Final   Comment: .            Fasting reference interval . For someone without known diabetes, a glucose value >125 mg/dL indicates that they may have diabetes and this should be confirmed with a follow-up test. .   . BUN 01/18/2019 26* 7 - 25 mg/dL Final  . Creat 01/18/2019 1.53* 0.70 - 1.25 mg/dL Final   Comment: For patients >49 years of age, the reference limit for Creatinine is approximately 13% higher for people identified as African-American. .   . BUN/Creatinine Ratio 01/18/2019 17  6 - 22 (calc) Final  . Sodium 01/18/2019 137  135 - 146 mmol/L Final  . Potassium 01/18/2019 4.7  3.5 - 5.3 mmol/L Final  . Chloride 01/18/2019 105  98 - 110 mmol/L Final  . CO2 01/18/2019 23  20 - 32 mmol/L Final  . Calcium 01/18/2019 9.4  8.6 - 10.3 mg/dL Final  . Total Protein 01/18/2019 6.7  6.1 - 8.1 g/dL Final  . Albumin 01/18/2019 4.0  3.6 - 5.1 g/dL Final  . Globulin 01/18/2019 2.7  1.9 - 3.7 g/dL (calc) Final  . AG Ratio 01/18/2019 1.5  1.0 - 2.5 (calc) Final  . Total Bilirubin 01/18/2019 0.4  0.2 - 1.2 mg/dL Final  . Alkaline phosphatase (APISO) 01/18/2019 58  35 - 144 U/L Final  . AST 01/18/2019 21  10 - 35 U/L Final  . ALT 01/18/2019 25  9 - 46 U/L Final    Past Medical History:  Diagnosis Date  . Adenomatous colon polyp 03/2008  . Aortic  aneurysm, thoracic (HCC) 09/09/2016   4.4 cm by echo 05/2015  . Arthritis    "joints in my fingers ache" (07/13/2015)  . Bicuspid aortic valve 09/09/2016  . CKD (chronic kidney disease), stage III (HCC)   . Clotting disorder (HCC)    on plavix for legs  . Diabetes mellitus without complication (HCC)   . Hemorrhoids   . Hyperlipidemia   . Hypertension   . Peripheral arterial disease (HCC)    a. dopppler 05/29/2015 revealed high-grade right popliteal and distal left SFA disease b. LE angio 06/28/2015 revealing high grade calcific   dx with distal L SFA and R popliteal artery s/p diamondback orbital rotational atherectomy, PTA of L SFA  c. 07/13/2015 diamondback orbital rotational atherectomy and drug eluting balloon angioplasty of R popliteal artery (P2 segment) using distal protection   . Type II diabetes mellitus ([place])   . Ulcerative colitis    Past Surgical History:  Procedure Laterality Date  . ABDOMINAL AORTOGRAM W/LOWER EXTREMITY N/A 04/27/2018   Procedure: ABDOMINAL AORTOGRAM W/LOWER EXTREMITY;  Surgeon: Lorretta Harp, MD;  Location: [facility];  Service: Cardiovascular;  Laterality: N/A;  . COLONOSCOPY W/ POLYPECTOMY  X 4-5  . COSMETIC SURGERY  < 2000   "removed birthmark from top of my head"  . PERIPHERAL VASCULAR ATHERECTOMY  04/27/2018   Procedure: PERIPHERAL VASCULAR ATHERECTOMY;  Surgeon: Lorretta Harp, MD;  Location: [facility];  Service: Cardiovascular;;  right popliteal artery  . PERIPHERAL VASCULAR CATHETERIZATION N/A 06/29/2015   Procedure: Lower Extremity Angiography;  Surgeon: Lorretta Harp, MD;  Location: [place];  Service: Cardiovascular;  Laterality: N/A;  . PERIPHERAL VASCULAR CATHETERIZATION N/A 07/13/2015   Procedure: Lower Extremity Angiography;  Surgeon: Lorretta Harp, MD;  Location: [facility];  Service: Cardiovascular;  Laterality: N/A;  . PERIPHERAL VASCULAR CATHETERIZATION  07/13/2015   Procedure: Peripheral Vascular  Atherectomy;  Surgeon: Lorretta Harp, MD;  Location: [facility];  Service: Cardiovascular;;  . PERIPHERAL VASCULAR INTERVENTION  04/27/2018   Procedure: PERIPHERAL VASCULAR INTERVENTION;  Surgeon: Lorretta Harp, MD;  Location: [facility];  Service: Cardiovascular;;  right popliteal artery  . TONSILLECTOMY     Current Outpatient Medications on File Prior to Visit  Medication Sig Dispense Refill  . ALPRAZolam (XANAX) 1 MG tablet Take 1 tablet (1 mg total) by mouth at bedtime as needed for anxiety or sleep. 20 tablet 0  . amLODipine (NORVASC) 10 MG tablet TAKE 1 TABLET BY MOUTH EVERY DAY 90 tablet 3  . aspirin EC 81 MG tablet Take 1 tablet (81 mg total) by mouth daily. 90 tablet 3  . atorvastatin (LIPITOR) 40 MG tablet TAKE 1 TABLET BY MOUTH EVERY DAY 90 tablet 3  . blood glucose meter kit and supplies Dispense based on patient and insurance preference. Check BS daily. 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 Needs strips and lancets #100 /5 refills 1 each 0  . carvedilol (COREG) 25 MG tablet TAKE 1 TABLET BY MOUTH TWICE A DAY 180 tablet 0  . clopidogrel (PLAVIX) 75 MG tablet TAKE 1 TABLET BY MOUTH DAILY WITH BREAKFAST. 90 tablet 3  . dicyclomine (BENTYL) 10 MG capsule TAKE 1 TABLET BY MOUTH 3-4 TIMES DAILY AS NEEDED FOR URGENCY AND SPASMS. 90 capsule 2  . fenofibrate 160 MG tablet Take 1 tablet (160 mg total) by mouth daily. 90 tablet 1  . glucose blood (ONE TOUCH ULTRA TEST) test strip Check BS bid 200 each 3  . Insulin Pen Needle (B-D UF III MINI PEN NEEDLES) 31G X 5 MM MISC USE DAILY WITH VICTOZA 100 each 0  . liraglutide (VICTOZA) 18 MG/3ML SOPN Inject 0.2 mLs (1.2 mg total) into the skin daily. 18 mL 3  . lisinopril-hydrochlorothiazide (ZESTORETIC) 20-25 MG tablet TAKE 1 TABLET BY MOUTH EVERY DAY 90 tablet 0  . mesalamine (LIALDA) 1.2 g EC tablet Take 2 tablets (2.4 g total) by mouth 2 (two) times daily. MUST HAVE VISIT FOR FURTHER REFILLS 360 tablet 0  . metFORMIN (GLUCOPHAGE) 1000 MG  tablet TAKE 1 TABLET BY  MOUTH TWICE A DAY WITH MEALS 180 tablet 1  . mupirocin cream (BACTROBAN) 2 % Apply 1 application topically 2 (two) times daily. (Patient taking differently: Apply 1 application topically daily as needed (skin irritation). ) 15 g 0  . sildenafil (VIAGRA) 100 MG tablet TAKE 1/2 OR 1 TABLET BY MOUTH DAILY AS NEEDED FOR ERECTILE DYSFUNCTION 3 tablet 39  . TRADJENTA 5 MG TABS tablet TAKE 1 TABLET BY MOUTH EVERY DAY 90 tablet 1  . vedolizumab (ENTYVIO) 300 MG injection Inject 300 mg into the vein every 8 (eight) weeks.      No current facility-administered medications on file prior to visit.    Allergies  Allergen Reactions  . Penicillins Other (See Comments)    Unsure, childhood reaction Has patient had a PCN reaction causing immediate rash, facial/tongue/throat swelling, SOB or lightheadedness with hypotension: Unknown Has patient had a PCN reaction causing severe rash involving mucus membranes or skin necrosis: Unknown Has patient had a PCN reaction that required hospitalization: No Has patient had a PCN reaction occurring within the last 10 years: No If all of the above answers are "NO", then may proceed with Cephalosporin use.    Social History   Socioeconomic History  . Marital status: Married    Spouse name: Not on file  . Number of children: 2  . Years of education: Not on file  . Highest education level: Not on file  Occupational History  . Occupation: Truck driver    Employer: [employer]  Social Needs  . Financial resource strain: Not on file  . Food insecurity    Worry: Not on file    Inability: Not on file  . Transportation needs    Medical: Not on file    Non-medical: Not on file  Tobacco Use  . Smoking status: Former Smoker    Packs/day: 1.50    Years: 25.00    Pack years: 37.50    Types: Cigarettes    Quit date: 08/12/1996    Years since quitting: 22.5  . Smokeless tobacco: Never Used  Substance and Sexual Activity  .  Alcohol use: Yes    Alcohol/week: 1.0 standard drinks    Types: 1 Cans of beer per week    Comment: social use  . Drug use: No  . Sexual activity: Yes  Lifestyle  . Physical activity    Days per week: Not on file    Minutes per session: Not on file  . Stress: Not on file  Relationships  . Social connections    Talks on phone: Not on file    Gets together: Not on file    Attends religious service: Not on file    Active member of club or organization: Not on file    Attends meetings of clubs or organizations: Not on file    Relationship status: Not on file  . Intimate partner violence    Fear of current or ex partner: Not on file    Emotionally abused: Not on file    Physically abused: Not on file    Forced sexual activity: Not on file  Other Topics Concern  . Not on file  Social History Narrative   ** Merged History Encounter **          Review of Systems  All other systems reviewed and are negative.      Objective:   Physical Exam  Constitutional: He appears well-developed and well-nourished.  Neck: No JVD present. No thyromegaly   present.  Cardiovascular: Normal rate, regular rhythm and intact distal pulses.  Murmur heard. Pulmonary/Chest: Effort normal and breath sounds normal. No respiratory distress. He has no wheezes. He has no rales.  Abdominal: Soft. Bowel sounds are normal. He exhibits no distension. There is no abdominal tenderness. There is no rebound and no guarding.  Musculoskeletal:        General: No edema.  Skin: Skin is warm. No rash noted.  Vitals reviewed.         Assessment & Plan:   The primary encounter diagnosis was Benign essential HTN. Diagnoses of Dyslipidemia, CKD (chronic kidney disease), stage III (HCC), and Type 2 diabetes mellitus with other specified complication, unspecified whether long term insulin use (HCC) were also pertinent to this visit. We had a long discussion about his chronic kidney disease and the likely  contributing factors to his decline.  I believe the reason the PICC line happen is due to his diabetes and hypertension.  His blood pressure today is well controlled.  We will discontinue Tradjenta and start the patient on Actos 30 mg a day and then reassess lab work in 3 months.  I encouraged him to increase his aerobic exercise and to drink plenty of water and avoid NSAIDs.  He is already on fenofibrate and fish oil for his triglycerides.  We discussed vest Cipro but at the present time he would like to try to work on diet first.  Reassess lab work in 3 months.  He is also complaining of pain in his MCP and PIP joints on his right hand on the second third and fourth fingers.  Will try diclofenac gel 1%, 2 g applied 4 times daily as needed pain 

## 2019-02-24 ENCOUNTER — Other Ambulatory Visit: Payer: Self-pay | Admitting: *Deleted

## 2019-02-24 MED ORDER — LINAGLIPTIN 5 MG PO TABS: 5.0000 mg | ORAL_TABLET | Freq: Every day | ORAL | 1 refills | Status: DC

## 2019-03-09 ENCOUNTER — Other Ambulatory Visit: Payer: Self-pay | Admitting: Cardiovascular Disease

## 2019-03-24 ENCOUNTER — Other Ambulatory Visit: Payer: Self-pay | Admitting: Family Medicine

## 2019-04-20 ENCOUNTER — Other Ambulatory Visit: Payer: Self-pay

## 2019-04-20 ENCOUNTER — Ambulatory Visit (HOSPITAL_COMMUNITY): Payer: 59 | Attending: Cardiovascular Disease

## 2019-04-20 DIAGNOSIS — Q231 Congenital insufficiency of aortic valve: Secondary | ICD-10-CM | POA: Diagnosis not present

## 2019-04-30 ENCOUNTER — Other Ambulatory Visit: Payer: Self-pay

## 2019-05-03 ENCOUNTER — Other Ambulatory Visit: Payer: Self-pay | Admitting: Family Medicine

## 2019-05-03 ENCOUNTER — Encounter: Payer: Self-pay | Admitting: Family Medicine

## 2019-05-03 ENCOUNTER — Ambulatory Visit (INDEPENDENT_AMBULATORY_CARE_PROVIDER_SITE_OTHER): Payer: 59 | Admitting: Family Medicine

## 2019-05-03 ENCOUNTER — Other Ambulatory Visit: Payer: Self-pay

## 2019-05-03 VITALS — BP 136/70 | HR 58 | Temp 98.6°F | Resp 14 | Ht 70.0 in | Wt 220.0 lb

## 2019-05-03 DIAGNOSIS — Z23 Encounter for immunization: Secondary | ICD-10-CM

## 2019-05-03 DIAGNOSIS — I10 Essential (primary) hypertension: Secondary | ICD-10-CM

## 2019-05-03 DIAGNOSIS — E1169 Type 2 diabetes mellitus with other specified complication: Secondary | ICD-10-CM

## 2019-05-03 DIAGNOSIS — N183 Chronic kidney disease, stage 3 unspecified: Secondary | ICD-10-CM

## 2019-05-03 DIAGNOSIS — E785 Hyperlipidemia, unspecified: Secondary | ICD-10-CM

## 2019-05-03 DIAGNOSIS — I209 Angina pectoris, unspecified: Secondary | ICD-10-CM

## 2019-05-03 NOTE — Progress Notes (Signed)
Subjective:    Patient ID: Matthew Chen, male    DOB: 11-Jan-1956, 63 y.o.   MRN: [id number]  Medication Refill  Patient is here today for follow-up of his chronic medical problems.  Patient has stage III chronic kidney disease, type 2 diabetes mellitus with a history of peripheral vascular disease requiring a stent in his right leg for which he is on dual antiplatelet therapy, essential hypertension, and dyslipidemia with hypertriglyceridemia.  At the patient's last office visit, his hemoglobin A1c was 7.6.  Therefore he started exercising more.  I asked the patient if he is getting any symptoms of claudication which he denies.  I then asked the patient if he is getting any chest tightness with activity and he reports that he is.  He states that if he is walking uphill or walking long distances, he will develop 2-3 out of 10 chest tightness and chest pain.  If he stops and rest the pain will go away quickly.  He denies any chest pain at rest.  He denies any shortness of breath at rest.  He denies any pleurisy.  He denies any muscle aches or muscle pain on his statin.  He denies any right upper quadrant pain.  He denies any neuropathy in his feet.  He denies any polyuria polydipsia or blurry vision.  He does report that his blood sugars have been near 100 and less than 122 hours after eating which is excellent Past Medical History:  Diagnosis Date  . Adenomatous colon polyp 03/2008  . Aortic aneurysm, thoracic ([place]) 09/09/2016   4.4 cm by echo 05/2015  . Arthritis    "joints in my fingers ache" (07/13/2015)  . Bicuspid aortic valve 09/09/2016  . CKD (chronic kidney disease), stage III ([place])   . Clotting disorder ([place])    on plavix for legs  . Diabetes mellitus without complication ([place])   . Hemorrhoids   . Hyperlipidemia   . Hypertension   . Peripheral arterial disease ([place])    a. dopppler 05/29/2015 revealed high-grade right popliteal and distal left SFA disease b. LE angio 06/28/2015 revealing  high grade calcific dx with distal L SFA and R popliteal artery s/p diamondback orbital rotational atherectomy, PTA of L SFA  c. 07/13/2015 diamondback orbital rotational atherectomy and drug eluting balloon angioplasty of R popliteal artery (P2 segment) using distal protection   . Type II diabetes mellitus ([place])   . Ulcerative colitis    Past Surgical History:  Procedure Laterality Date  . ABDOMINAL AORTOGRAM W/LOWER EXTREMITY N/A 04/27/2018   Procedure: ABDOMINAL AORTOGRAM W/LOWER EXTREMITY;  Surgeon: Lorretta Harp, MD;  Location: [facility];  Service: Cardiovascular;  Laterality: N/A;  . COLONOSCOPY W/ POLYPECTOMY  X 4-5  . COSMETIC SURGERY  < 2000   "removed birthmark from top of my head"  . PERIPHERAL VASCULAR ATHERECTOMY  04/27/2018   Procedure: PERIPHERAL VASCULAR ATHERECTOMY;  Surgeon: Lorretta Harp, MD;  Location: [facility];  Service: Cardiovascular;;  right popliteal artery  . PERIPHERAL VASCULAR CATHETERIZATION N/A 06/29/2015   Procedure: Lower Extremity Angiography;  Surgeon: Lorretta Harp, MD;  Location: [facility];  Service: Cardiovascular;  Laterality: N/A;  . PERIPHERAL VASCULAR CATHETERIZATION N/A 07/13/2015   Procedure: Lower Extremity Angiography;  Surgeon: Lorretta Harp, MD;  Location: [facility];  Service: Cardiovascular;  Laterality: N/A;  . PERIPHERAL VASCULAR CATHETERIZATION  07/13/2015   Procedure: Peripheral Vascular Atherectomy;  Surgeon: Lorretta Harp, MD;  Location:  [facility];  Service: Cardiovascular;;  . PERIPHERAL VASCULAR INTERVENTION  04/27/2018   Procedure: PERIPHERAL VASCULAR INTERVENTION;  Surgeon: Lorretta Harp, MD;  Location: [facility];  Service: Cardiovascular;;  right popliteal artery  . TONSILLECTOMY     Current Outpatient Medications on File Prior to Visit  Medication Sig Dispense Refill  . ALPRAZolam (XANAX) 1 MG tablet Take 1 tablet (1 mg total) by mouth at bedtime as needed for  anxiety or sleep. 20 tablet 0  . amLODipine (NORVASC) 10 MG tablet TAKE 1 TABLET BY MOUTH EVERY DAY 90 tablet 3  . aspirin EC 81 MG tablet Take 1 tablet (81 mg total) by mouth daily. 90 tablet 3  . atorvastatin (LIPITOR) 40 MG tablet TAKE 1 TABLET BY MOUTH EVERY DAY 90 tablet 3  . blood glucose meter kit and supplies Dispense based on patient and insurance preference. Check BS daily. 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 Needs strips and lancets #100 /5 refills 1 each 0  . carvedilol (COREG) 25 MG tablet TAKE 1 TABLET BY MOUTH TWICE A DAY 180 tablet 1  . clopidogrel (PLAVIX) 75 MG tablet TAKE 1 TABLET BY MOUTH DAILY WITH BREAKFAST. 90 tablet 3  . fenofibrate 160 MG tablet Take 1 tablet (160 mg total) by mouth daily. 90 tablet 1  . glucose blood (ONE TOUCH ULTRA TEST) test strip Check BS bid 200 each 3  . Insulin Pen Needle (B-D UF III MINI PEN NEEDLES) 31G X 5 MM MISC USE DAILY WITH VICTOZA 100 each 0  . linagliptin (TRADJENTA) 5 MG TABS tablet Take 1 tablet (5 mg total) by mouth daily. 90 tablet 1  . liraglutide (VICTOZA) 18 MG/3ML SOPN Inject 0.2 mLs (1.2 mg total) into the skin daily. 18 mL 3  . lisinopril-hydrochlorothiazide (ZESTORETIC) 20-25 MG tablet TAKE 1 TABLET BY MOUTH EVERY DAY 90 tablet 1  . mesalamine (LIALDA) 1.2 g EC tablet Take 2 tablets (2.4 g total) by mouth 2 (two) times daily. MUST HAVE VISIT FOR FURTHER REFILLS 360 tablet 0  . metFORMIN (GLUCOPHAGE) 1000 MG tablet TAKE 1 TABLET BY MOUTH TWICE A DAY WITH MEALS 180 tablet 1  . mupirocin cream (BACTROBAN) 2 % Apply 1 application topically 2 (two) times daily. (Patient taking differently: Apply 1 application topically daily as needed (skin irritation). ) 15 g 0  . pioglitazone (ACTOS) 30 MG tablet Take 1 tablet (30 mg total) by mouth daily. Stop Tradjenta 90 tablet 3  . sildenafil (VIAGRA) 100 MG tablet TAKE 1/2 OR 1 TABLET BY MOUTH DAILY AS NEEDED FOR ERECTILE DYSFUNCTION 3 tablet 39  . vedolizumab (ENTYVIO) 300 MG injection Inject 300 mg into  the vein every 8 (eight) weeks.      No current facility-administered medications on file prior to visit.    Allergies  Allergen Reactions  . Penicillins Other (See Comments)    Unsure, childhood reaction Has patient had a PCN reaction causing immediate rash, facial/tongue/throat swelling, SOB or lightheadedness with hypotension: Unknown Has patient had a PCN reaction causing severe rash involving mucus membranes or skin necrosis: Unknown Has patient had a PCN reaction that required hospitalization: No Has patient had a PCN reaction occurring within the last 10 years: No If all of the above answers are "NO", then may proceed with Cephalosporin use.    Social History   Socioeconomic History  . Marital status: Married    Spouse name: Not on file  . Number of children: 2  . Years of education: Not  on file  . Highest education level: Not on file  Occupational History  . Occupation: Truck Education administrator: [employer]  Social Needs  . Financial resource strain: Not on file  . Food insecurity    Worry: Not on file    Inability: Not on file  . Transportation needs    Medical: Not on file    Non-medical: Not on file  Tobacco Use  . Smoking status: Former Smoker    Packs/day: 1.50    Years: 25.00    Pack years: 37.50    Types: Cigarettes    Quit date: 08/12/1996    Years since quitting: 22.7  . Smokeless tobacco: Never Used  Substance and Sexual Activity  . Alcohol use: Yes    Alcohol/week: 1.0 standard drinks    Types: 1 Cans of beer per week    Comment: social use  . Drug use: No  . Sexual activity: Yes  Lifestyle  . Physical activity    Days per week: Not on file    Minutes per session: Not on file  . Stress: Not on file  Relationships  . Social Herbalist on phone: Not on file    Gets together: Not on file    Attends religious service: Not on file    Active member of club or organization: Not on file    Attends meetings of clubs or  organizations: Not on file    Relationship status: Not on file  . Intimate partner violence    Fear of current or ex partner: Not on file    Emotionally abused: Not on file    Physically abused: Not on file    Forced sexual activity: Not on file  Other Topics Concern  . Not on file  Social History Narrative   ** Merged History Encounter **          Review of Systems  All other systems reviewed and are negative.      Objective:   Physical Exam  Constitutional: He appears well-developed and well-nourished.  Neck: No JVD present. No thyromegaly present.  Cardiovascular: Normal rate, regular rhythm and intact distal pulses.  Murmur heard. Pulmonary/Chest: Effort normal and breath sounds normal. No respiratory distress. He has no wheezes. He has no rales.  Abdominal: Soft. Bowel sounds are normal. He exhibits no distension. There is no abdominal tenderness. There is no rebound and no guarding.  Musculoskeletal:        General: No edema.  Skin: Skin is warm. No rash noted.  Vitals reviewed.         Assessment & Plan:   The primary encounter diagnosis was Benign essential HTN. Diagnoses of Dyslipidemia, CKD (chronic kidney disease), stage III ([place]), Type 2 diabetes mellitus with other specified complication, unspecified whether long term insulin use ([place]), and Angina pectoris ([place]) were also pertinent to this visit. I will repeat a CBC, CMP, fasting lipid panel, and hemoglobin A1c.  Given his history of peripheral vascular disease, I would like his hemoglobin A1c to be less than 7 and ideally less than 6.5.  I congratulated the patient on exercise and encouraged him to eat a low carbohydrate diet.  However I am concerned by his symptoms of angina.  He is reporting chest tightness with exertion.  Given his previous history, I feel that the patient would benefit from a stress test so I will refer the patient back to his cardiologist for their opinion.  Diabetic foot exam was  performed today and is normal.  Patient denies any depression.

## 2019-05-03 NOTE — Addendum Note (Signed)
Addended by: Shary Decamp B on: 05/03/2019 11:15 AM   Modules accepted: Orders

## 2019-05-04 LAB — COMPLETE METABOLIC PANEL WITH GFR
AG Ratio: 1.6 (calc) (ref 1.0–2.5)
ALT: 22 U/L (ref 9–46)
AST: 21 U/L (ref 10–35)
Albumin: 4.2 g/dL (ref 3.6–5.1)
Alkaline phosphatase (APISO): 46 U/L (ref 35–144)
BUN/Creatinine Ratio: 15 (calc) (ref 6–22)
BUN: 24 mg/dL (ref 7–25)
CO2: 25 mmol/L (ref 20–32)
Calcium: 9.5 mg/dL (ref 8.6–10.3)
Chloride: 105 mmol/L (ref 98–110)
Creat: 1.56 mg/dL — ABNORMAL HIGH (ref 0.70–1.25)
GFR, Est African American: 54 mL/min/{1.73_m2} — ABNORMAL LOW (ref >=60)
GFR, Est Non African American: 47 mL/min/{1.73_m2} — ABNORMAL LOW (ref >=60)
Globulin: 2.6 g/dL (calc) (ref 1.9–3.7)
Glucose, Bld: 121 mg/dL — ABNORMAL HIGH (ref 65–99)
Potassium: 4.4 mmol/L (ref 3.5–5.3)
Sodium: 141 mmol/L (ref 135–146)
Total Bilirubin: 0.5 mg/dL (ref 0.2–1.2)
Total Protein: 6.8 g/dL (ref 6.1–8.1)

## 2019-05-04 LAB — CBC WITH DIFFERENTIAL/PLATELET
Absolute Monocytes: 620 cells/uL (ref 200–950)
Basophils Absolute: 40 cells/uL (ref 0–200)
Basophils Relative: 0.9 %
Eosinophils Absolute: 198 cells/uL (ref 15–500)
Eosinophils Relative: 4.5 %
HCT: 35.1 % — ABNORMAL LOW (ref 38.5–50.0)
Hemoglobin: 11.7 g/dL — ABNORMAL LOW (ref 13.2–17.1)
Lymphs Abs: 1452 cells/uL (ref 850–3900)
MCH: 30.2 pg (ref 27.0–33.0)
MCHC: 33.3 g/dL (ref 32.0–36.0)
MCV: 90.7 fL (ref 80.0–100.0)
MPV: 11.3 fL (ref 7.5–12.5)
Monocytes Relative: 14.1 %
Neutro Abs: 2090 cells/uL (ref 1500–7800)
Neutrophils Relative %: 47.5 %
Platelets: 247 10*3/uL (ref 140–400)
RBC: 3.87 10*6/uL — ABNORMAL LOW (ref 4.20–5.80)
RDW: 14.8 % (ref 11.0–15.0)
Total Lymphocyte: 33 %
WBC: 4.4 10*3/uL (ref 3.8–10.8)

## 2019-05-04 LAB — HEMOGLOBIN A1C
Hgb A1c MFr Bld: 6.9 % of total Hgb — ABNORMAL HIGH (ref <5.7)
Mean Plasma Glucose: 151 (calc)
eAG (mmol/L): 8.4 (calc)

## 2019-05-04 LAB — LIPID PANEL
Cholesterol: 124 mg/dL (ref <200)
HDL: 35 mg/dL — ABNORMAL LOW (ref >=40)
LDL Cholesterol (Calc): 63 mg/dL (calc)
Non-HDL Cholesterol (Calc): 89 mg/dL (calc) (ref <130)
Total CHOL/HDL Ratio: 3.5 (calc) (ref <5.0)
Triglycerides: 182 mg/dL — ABNORMAL HIGH (ref <150)

## 2019-05-04 LAB — MICROALBUMIN, URINE: Microalb, Ur: <0.2 mg/dL

## 2019-05-13 ENCOUNTER — Ambulatory Visit: Payer: Self-pay | Admitting: Cardiology

## 2019-05-19 ENCOUNTER — Encounter: Payer: Self-pay | Admitting: Gastroenterology

## 2019-05-21 ENCOUNTER — Other Ambulatory Visit: Payer: Self-pay

## 2019-05-21 ENCOUNTER — Ambulatory Visit: Payer: 59

## 2019-05-21 VITALS — BP 136/64

## 2019-05-21 DIAGNOSIS — Z0130 Encounter for examination of blood pressure without abnormal findings: Secondary | ICD-10-CM

## 2019-05-21 NOTE — Progress Notes (Signed)
Patient came in today for a blood pressure check. Patient's blood pressure check was 136/64.

## 2019-05-31 ENCOUNTER — Ambulatory Visit: Payer: Self-pay | Admitting: Cardiology

## 2019-05-31 NOTE — Progress Notes (Deleted)
Primary Physician/Referring:  Susy Frizzle, MD  Patient ID: Matthew Chen, male    DOB: February 29, 1956, 63 y.o.   MRN: [id number]  No chief complaint on file.  HPI:    Matthew Chen  is a 63 y.o. caucasian gentleman with hypertension, diabetes, hyperlipidemia, previous tobacco smoker, elevated BMI, and PAD s/p revascularization.  He underwent LSFA and Rt popliteal PTA in 2016 and had rt popliteal PTA re-done in 04/2018. He is presenting to establish care and evaluation of angina.   Past Medical History:  Diagnosis Date  . Adenomatous colon polyp 03/2008  . Aortic aneurysm, thoracic ([place]) 09/09/2016   4.4 cm by echo 05/2015  . Arthritis    "joints in my fingers ache" (07/13/2015)  . Bicuspid aortic valve 09/09/2016  . CKD (chronic kidney disease), stage III ([place])   . Clotting disorder ([place])    on plavix for legs  . Diabetes mellitus without complication ([place])   . Hemorrhoids   . Hyperlipidemia   . Hypertension   . Peripheral arterial disease ([place])    a. dopppler 05/29/2015 revealed high-grade right popliteal and distal left SFA disease b. LE angio 06/28/2015 revealing high grade calcific dx with distal L SFA and R popliteal artery s/p diamondback orbital rotational atherectomy, PTA of L SFA  c. 07/13/2015 diamondback orbital rotational atherectomy and drug eluting balloon angioplasty of R popliteal artery (P2 segment) using distal protection   . Type II diabetes mellitus ([place])   . Ulcerative colitis    Past Surgical History:  Procedure Laterality Date  . ABDOMINAL AORTOGRAM W/LOWER EXTREMITY N/A 04/27/2018   Procedure: ABDOMINAL AORTOGRAM W/LOWER EXTREMITY;  Surgeon: Lorretta Harp, MD;  Location: [facility];  Service: Cardiovascular;  Laterality: N/A;  . COLONOSCOPY W/ POLYPECTOMY  X 4-5  . COSMETIC SURGERY  < 2000   "removed birthmark from top of my head"  . PERIPHERAL VASCULAR ATHERECTOMY  04/27/2018   Procedure: PERIPHERAL VASCULAR ATHERECTOMY;  Surgeon: Lorretta Harp,  MD;  Location: [facility];  Service: Cardiovascular;;  right popliteal artery  . PERIPHERAL VASCULAR CATHETERIZATION N/A 06/29/2015   Procedure: Lower Extremity Angiography;  Surgeon: Lorretta Harp, MD;  Location: [facility];  Service: Cardiovascular;  Laterality: N/A;  . PERIPHERAL VASCULAR CATHETERIZATION N/A 07/13/2015   Procedure: Lower Extremity Angiography;  Surgeon: Lorretta Harp, MD;  Location: [facility];  Service: Cardiovascular;  Laterality: N/A;  . PERIPHERAL VASCULAR CATHETERIZATION  07/13/2015   Procedure: Peripheral Vascular Atherectomy;  Surgeon: Lorretta Harp, MD;  Location: [facility];  Service: Cardiovascular;;  . PERIPHERAL VASCULAR INTERVENTION  04/27/2018   Procedure: PERIPHERAL VASCULAR INTERVENTION;  Surgeon: Lorretta Harp, MD;  Location: [facility];  Service: Cardiovascular;;  right popliteal artery  . TONSILLECTOMY     Social History   Socioeconomic History  . Marital status: Married    Spouse name: Not on file  . Number of children: 2  . Years of education: Not on file  . Highest education level: Not on file  Occupational History  . Occupation: Truck Education administrator: [employer]  Social Needs  . Financial resource strain: Not on file  . Food insecurity    Worry: Not on file    Inability: Not on file  . Transportation needs    Medical: Not on file    Non-medical: Not on file  Tobacco Use  . Smoking status: Former Smoker  Packs/day: 1.50    Years: 25.00    Pack years: 37.50    Types: Cigarettes    Quit date: 08/12/1996    Years since quitting: 22.8  . Smokeless tobacco: Never Used  Substance and Sexual Activity  . Alcohol use: Yes    Alcohol/week: 1.0 standard drinks    Types: 1 Cans of beer per week    Comment: social use  . Drug use: No  . Sexual activity: Yes  Lifestyle  . Physical activity    Days per week: Not on file    Minutes per session: Not on file  . Stress: Not  on file  Relationships  . Social Herbalist on phone: Not on file    Gets together: Not on file    Attends religious service: Not on file    Active member of club or organization: Not on file    Attends meetings of clubs or organizations: Not on file    Relationship status: Not on file  . Intimate partner violence    Fear of current or ex partner: Not on file    Emotionally abused: Not on file    Physically abused: Not on file    Forced sexual activity: Not on file  Other Topics Concern  . Not on file  Social History Narrative   ** Merged History Encounter **       ROS  ***ROS Objective   Vitals with BMI 05/21/2019 05/03/2019 02/15/2019  Height - _0  _1   Weight - 220 lbs 217 lbs  BMI - 73.53 29.92  Systolic 426 834 196  Diastolic 64 70 80  Pulse - 58 62    There were no vitals taken for this visit. There is no height or weight on file to calculate BMI.   ***Physical Exam Radiology: No results found.  Laboratory examination:  *** Recent Labs    01/18/19 0816 05/03/19 0840  NA 137 141  K 4.7 4.4  CL 105 105  CO2 23 25  GLUCOSE 151* 121*  BUN 26* 24  CREATININE 1.53* 1.56*  CALCIUM 9.4 9.5  GFRNONAA  --  47*  GFRAA  --  54*   CMP Latest Ref Rng & Units 05/03/2019 01/18/2019 04/30/2018  Glucose 65 - 99 mg/dL 121(H) 151(H) 179(H)  BUN 7 - 25 mg/dL 24 26(H) 24(H)  Creatinine 0.70 - 1.25 mg/dL 1.56(H) 1.53(H) 1.34  Sodium 135 - 146 mmol/L 141 137 142  Potassium 3.5 - 5.3 mmol/L 4.4 4.7 3.6  Chloride 98 - 110 mmol/L 105 105 103  CO2 20 - 32 mmol/L _2 Calcium 8.6 - 10.3 mg/dL 9.5 9.4 9.8  Total Protein 6.1 - 8.1 g/dL 6.8 6.7 6.8  Total Bilirubin 0.2 - 1.2 mg/dL 0.5 0.4 0.5  Alkaline Phos 39 - 117 U/L - - 52  AST 10 - 35 U/L _3 ALT 9 - 46 U/L _4 CBC Latest Ref Rng & Units 05/03/2019 01/18/2019 04/30/2018  WBC 3.8 - 10.8 Thousand/uL 4.4 5.1 7.3  Hemoglobin 13.2 - 17.1 g/dL 11.7(L) 12.3(L) 11.8(L)  Hematocrit 38.5 - 50.0 %  35.1(L) 38.0(L) 35.4(L)  Platelets 140 - 400 Thousand/uL 247 291 262.0   Lipid Panel     Component Value Date/Time   CHOL 124 05/03/2019 0840   CHOL 119 10/07/2016 0843   TRIG 182 (H) 05/03/2019 0840   HDL 35 (L) 05/03/2019 0840   HDL 26 (L) 10/07/2016 2229  CHOLHDL 3.5 05/03/2019 0840   VLDL 47 (H) 11/25/2016 0838   LDLCALC 63 05/03/2019 0840   HEMOGLOBIN A1C Lab Results  Component Value Date   HGBA1C 6.9 (H) 05/03/2019   MPG 151 05/03/2019   TSH No results for input(s): TSH in the last 8760 hours. Medications and allergies   Allergies  Allergen Reactions  . Penicillins Other (See Comments)    Unsure, childhood reaction Has patient had a PCN reaction causing immediate rash, facial/tongue/throat swelling, SOB or lightheadedness with hypotension: Unknown Has patient had a PCN reaction causing severe rash involving mucus membranes or skin necrosis: Unknown Has patient had a PCN reaction that required hospitalization: No Has patient had a PCN reaction occurring within the last 10 years: No If all of the above answers are "NO", then may proceed with Cephalosporin use.      Prior to Admission medications   Medication Sig Start Date End Date Taking? Authorizing Provider  ALPRAZolam Duanne Moron) 1 MG tablet Take 1 tablet (1 mg total) by mouth at bedtime as needed for anxiety or sleep. 05/26/18   Susy Frizzle, MD  amLODipine (NORVASC) 10 MG tablet TAKE 1 TABLET BY MOUTH EVERY DAY 09/02/18   Skeet Latch, MD  aspirin EC 81 MG tablet Take 1 tablet (81 mg total) by mouth daily. 06/01/15   Skeet Latch, MD  atorvastatin (LIPITOR) 40 MG tablet TAKE 1 TABLET BY MOUTH EVERY DAY 06/16/18   Lorretta Harp, MD  blood glucose meter kit and supplies Dispense based on patient and insurance preference. Check BS daily. 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 Needs strips and lancets #100 /5 refills 02/06/16   Susy Frizzle, MD  carvedilol (COREG) 25 MG tablet TAKE 1 TABLET BY MOUTH TWICE A DAY  03/09/19   Skeet Latch, MD  clopidogrel (PLAVIX) 75 MG tablet TAKE 1 TABLET BY MOUTH DAILY WITH BREAKFAST. 10/19/18   Reino Bellis B, NP  fenofibrate 160 MG tablet TAKE 1 TABLET BY MOUTH EVERY DAY 05/03/19   Susy Frizzle, MD  glucose blood (ONE TOUCH ULTRA TEST) test strip Check BS bid 04/23/18   Susy Frizzle, MD  Insulin Pen Needle (B-D UF III MINI PEN NEEDLES) 31G X 5 MM MISC USE DAILY WITH VICTOZA 10/27/17   Susy Frizzle, MD  linagliptin (TRADJENTA) 5 MG TABS tablet Take 1 tablet (5 mg total) by mouth daily. 02/24/19   Susy Frizzle, MD  liraglutide (VICTOZA) 18 MG/3ML SOPN Inject 0.2 mLs (1.2 mg total) into the skin daily. 09/17/18   Susy Frizzle, MD  lisinopril-hydrochlorothiazide (ZESTORETIC) 20-25 MG tablet TAKE 1 TABLET BY MOUTH EVERY DAY 03/09/19   Skeet Latch, MD  mesalamine (LIALDA) 1.2 g EC tablet Take 2 tablets (2.4 g total) by mouth 2 (two) times daily. MUST HAVE VISIT FOR FURTHER REFILLS 12/11/18   [name], MD  metFORMIN (GLUCOPHAGE) 1000 MG tablet TAKE 1 TABLET BY MOUTH TWICE A DAY WITH MEALS 03/24/19   Susy Frizzle, MD  mupirocin cream (BACTROBAN) 2 % Apply 1 application topically 2 (two) times daily. Patient taking differently: Apply 1 application topically daily as needed (skin irritation).  03/02/18   Susy Frizzle, MD  pioglitazone (ACTOS) 30 MG tablet Take 1 tablet (30 mg total) by mouth daily. Stop Tradjenta 02/15/19   Susy Frizzle, MD  sildenafil (VIAGRA) 100 MG tablet TAKE 1/2 OR 1 TABLET BY MOUTH DAILY AS NEEDED FOR ERECTILE DYSFUNCTION 08/03/18   Susy Frizzle, MD  vedolizumab (ENTYVIO) 300  MG injection Inject 300 mg into the vein every 8 (eight) weeks.     [provider]     Current Outpatient Medications  Medication Instructions  . ALPRAZolam (XANAX) 1 mg, Oral, At bedtime PRN  . amLODipine (NORVASC) 10 MG tablet TAKE 1 TABLET BY MOUTH EVERY DAY  . aspirin EC 81 mg, Oral, Daily  . atorvastatin (LIPITOR) 40  MG tablet TAKE 1 TABLET BY MOUTH EVERY DAY  . blood glucose meter kit and supplies Dispense based on patient and insurance preference. Check BS daily. 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 Needs strips and lancets #100 /5 refills  . carvedilol (COREG) 25 MG tablet TAKE 1 TABLET BY MOUTH TWICE A DAY  . clopidogrel (PLAVIX) 75 MG tablet TAKE 1 TABLET BY MOUTH DAILY WITH BREAKFAST.  . fenofibrate 160 MG tablet TAKE 1 TABLET BY MOUTH EVERY DAY  . glucose blood (ONE TOUCH ULTRA TEST) test strip Check BS bid  . Insulin Pen Needle (B-D UF III MINI PEN NEEDLES) 31G X 5 MM MISC USE DAILY WITH VICTOZA  . linagliptin (TRADJENTA) 5 mg, Oral, Daily  . liraglutide (VICTOZA) 1.2 mg, Subcutaneous, Daily  . lisinopril-hydrochlorothiazide (ZESTORETIC) 20-25 MG tablet TAKE 1 TABLET BY MOUTH EVERY DAY  . mesalamine (LIALDA) 2.4 g, Oral, 2 times daily, MUST HAVE VISIT FOR FURTHER REFILLS  . metFORMIN (GLUCOPHAGE) 1000 MG tablet TAKE 1 TABLET BY MOUTH TWICE A DAY WITH MEALS  . mupirocin cream (BACTROBAN) 2 % 1 application, Topical, 2 times daily  . pioglitazone (ACTOS) 30 mg, Oral, Daily, Stop Tradjenta  . sildenafil (VIAGRA) 100 MG tablet TAKE 1/2 OR 1 TABLET BY MOUTH DAILY AS NEEDED FOR ERECTILE DYSFUNCTION  . vedolizumab (ENTYVIO) 300 mg, Intravenous, Every 8 weeks    Cardiac Studies:  04/2019 Echo:   1. The left ventricle has normal systolic function with an ejection fraction of 60-65%. The cavity size was normal. Left ventricular diastolic Doppler parameters are consistent with pseudonormalization.  2. The right ventricle has normal systolic function. The cavity was normal. There is no increase in right ventricular wall thickness.  3. No evidence of mitral valve stenosis.  4. The aortic valve is bicuspid. Moderate calcification of the aortic valve.  5. The aorta is abnormal unless otherwise noted.  6. There is mild to moderate dilatation of the aortic root and of the ascending aorta.  PV cath 04/2018:  Procedures  Performed: 1.  Abdominal aortogram/bilateral iliac angiogram/bifemoral runoff 2.  Contralateral access (second order catheter placement) 3.  Placement of NAV 6 distal protection device in right below the knee popliteal artery 4.  Diamondback orbital rotational atherectomy, drug-coated balloon angioplasty, self-expanding stenting right popliteal artery  Assessment  No diagnosis found.  ***  Recommendations:   ***  Patriciaann Clan, DO 05/31/2019, 9:58 AM

## 2019-06-02 ENCOUNTER — Other Ambulatory Visit: Payer: Self-pay | Admitting: Cardiovascular Disease

## 2019-06-07 ENCOUNTER — Ambulatory Visit (INDEPENDENT_AMBULATORY_CARE_PROVIDER_SITE_OTHER): Payer: 59 | Admitting: Nurse Practitioner

## 2019-06-07 ENCOUNTER — Other Ambulatory Visit: Payer: Self-pay

## 2019-06-07 ENCOUNTER — Encounter: Payer: Self-pay | Admitting: Nurse Practitioner

## 2019-06-07 ENCOUNTER — Telehealth: Payer: Self-pay

## 2019-06-07 VITALS — BP 118/64 | HR 72 | Temp 98.0°F | Ht 70.0 in | Wt 226.0 lb

## 2019-06-07 DIAGNOSIS — K51919 Ulcerative colitis, unspecified with unspecified complications: Secondary | ICD-10-CM

## 2019-06-07 DIAGNOSIS — Z1159 Encounter for screening for other viral diseases: Secondary | ICD-10-CM

## 2019-06-07 DIAGNOSIS — Z7901 Long term (current) use of anticoagulants: Secondary | ICD-10-CM

## 2019-06-07 MED ORDER — NA SULFATE-K SULFATE-MG SULF 17.5-3.13-1.6 GM/177ML PO SOLN: ORAL | 0 refills | Status: DC

## 2019-06-07 NOTE — Patient Instructions (Signed)
If you are age 63 or older, your body mass index should be between 23-30. Your Body mass index is 32.43 kg/m. If this is out of the aforementioned range listed, please consider follow up with your Primary Care Provider.  If you are age 59 or younger, your body mass index should be between 19-25. Your Body mass index is 32.43 kg/m. If this is out of the aformentioned range listed, please consider follow up with your Primary Care Provider.   You have been scheduled for a colonoscopy. Please follow written instructions given to you at your visit today.  Please pick up your prep supplies at the pharmacy within the next 1-3 days. If you use inhalers (even only as needed), please bring them with you on the day of your procedure. Your physician has requested that you go to www.startemmi.com and enter the access code given to you at your visit today. This web site gives a general overview about your procedure. However, you should still follow specific instructions given to you by our office regarding your preparation for the procedure.  We have sent the following medications to your pharmacy for you to pick up at your convenience: [facility] will be contacted by our office prior to your procedure for directions on holding your Plavix.  If you do not hear from our office 1 week prior to your scheduled procedure, please call [phone] to discuss.   Thank you for choosing me and [facility].   Tye Savoy, NP

## 2019-06-07 NOTE — Telephone Encounter (Signed)
[facility] Pre-operative Risk Assessment     Request for surgical clearance:     Endoscopy Procedure  What type of surgery is being performed?     Colonoscopy  When is this surgery scheduled?     06/28/19  What type of clearance is required ?   Pharmacy  Are there any medications that need to be held prior to surgery and how long? HOLD PLAVIX FIVE DAYS PRIOR  Practice name and name of physician performing surgery?      [facility]/Dr. Fuller Plan  What is your office phone and fax number?      Phone- [phone]  Fax- [phone] Attn: Peter Congo, RMA  Anesthesia type (None, local, MAC, general) ?       MAC

## 2019-06-07 NOTE — Telephone Encounter (Signed)
OK to hold anti platelet agents for colonoscopy  JJB

## 2019-06-07 NOTE — Progress Notes (Signed)
Reviewed and agree with management plan.  Averee Harb T. Karl Erway, MD FACG [facility]  

## 2019-06-07 NOTE — Progress Notes (Signed)
Chief Complaint:    Proctosigmoiditis, due for colonoscopy  IMPRESSION and PLAN:    40.  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  Last colonoscopy was 2 years ago, prior to starting Entyvio.  He is due for surveillance colonoscopy.  -The risks and benefits of colonoscopy with possible polypectomy / biopsies were discussed and the patient agrees to proceed.   2.  Chronic anti-platelet therapy. He is has PAD,  status post intervention of both right and left lower extremities.  His last intervention was DES placement to right popliteal artery in September 2019.  He is maintained on Plavix.  Hold Plavix for 5 days before procedure - will instruct when and how to resume after procedure. Patient understands that there is a low but real risk of cardiovascular event such as heart attack, stroke, or embolism /  thrombosis, or ischemia while off Plavix. The patient consents to proceed. Will communicate by phone or EMR with patient's prescribing provider to confirm that holding Plavix is reasonable in this case.   3.  Diabetes.  Patient is on Glucophage, Victoza and Actos.  Hemoglobin A1c last month was 6.9   HPI:     Patient is a 63 year old male with PMH significant for longstanding proctosigmoiditis maintained on Entyvio, CKD 3, diabetes, hypertension, bicuspid aortic valve,  peripheral artery disease s/p PTA of left superficial femoral artery,  right popliteal PTA in 2016, atherectomy and DES placement to right popliteal artery in September 2019.   Patient is followed by Dr. Fuller Plan.  Last colonoscopy October 2018 with endoscopic findings of mild inflammation from the rectum to the descending colon.  Left and right colon biopsies were unremarkable however.  A 7 mm polyp was removed but it was proven to be hyperplastic.  Patient says he had a bad year in 2019, had continuous UC flare symptoms.  He was started on Entyvio and now  asymptomatic over the last year.  Bowel movements are normal without blood.  No abdominal pain.  No general medical complaints.    Review of systems:     No chest pain, no SOB, no fevers, no urinary sx   Past Medical History:  Diagnosis Date  . Adenomatous colon polyp 03/2008  . Aortic aneurysm, thoracic ([place]) 09/09/2016   4.4 cm by echo 05/2015  . Arthritis    "joints in my fingers ache" (07/13/2015)  . Bicuspid aortic valve 09/09/2016  . CKD (chronic kidney disease), stage III   . Clotting disorder ([place])    on plavix for legs  . Diabetes mellitus without complication ([place])   . Hemorrhoids   . Hyperlipidemia   . Hypertension   . Peripheral arterial disease ([place])    a. dopppler 05/29/2015 revealed high-grade right popliteal and distal left SFA disease b. LE angio 06/28/2015 revealing high grade calcific dx with distal L SFA and R popliteal artery s/p diamondback orbital rotational atherectomy, PTA of L SFA  c. 07/13/2015 diamondback orbital rotational atherectomy and drug eluting balloon angioplasty of R popliteal artery (P2 segment) using distal protection   . Type II diabetes mellitus ([place])   . Ulcerative colitis     Patient's surgical history, family medical history, social history, medications and allergies were all reviewed in Epic   Current Outpatient Medications  Medication Sig Dispense Refill  . ALPRAZolam (XANAX) 1 MG tablet Take 1 tablet (1 mg total) by mouth at bedtime as needed for anxiety or sleep. [place]  tablet 0  . amLODipine (NORVASC) 10 MG tablet TAKE 1 TABLET BY MOUTH EVERY DAY 90 tablet 3  . aspirin EC 81 MG tablet Take 1 tablet (81 mg total) by mouth daily. 90 tablet 3  . atorvastatin (LIPITOR) 40 MG tablet TAKE 1 TABLET BY MOUTH EVERY DAY 90 tablet 3  . blood glucose meter kit and supplies Dispense based on patient and insurance preference. Check BS daily. 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 Needs strips and lancets #100 /5 refills 1 each 0  . carvedilol (COREG) 25 MG tablet TAKE 1  TABLET BY MOUTH TWICE A DAY 180 tablet 1  . clopidogrel (PLAVIX) 75 MG tablet TAKE 1 TABLET BY MOUTH DAILY WITH BREAKFAST. 90 tablet 3  . fenofibrate 160 MG tablet TAKE 1 TABLET BY MOUTH EVERY DAY 90 tablet 1  . glucose blood (ONE TOUCH ULTRA TEST) test strip Check BS bid 200 each 3  . Insulin Pen Needle (B-D UF III MINI PEN NEEDLES) 31G X 5 MM MISC USE DAILY WITH VICTOZA 100 each 0  . linagliptin (TRADJENTA) 5 MG TABS tablet Take 1 tablet (5 mg total) by mouth daily. 90 tablet 1  . liraglutide (VICTOZA) 18 MG/3ML SOPN Inject 0.2 mLs (1.2 mg total) into the skin daily. 18 mL 3  . lisinopril-hydrochlorothiazide (ZESTORETIC) 20-25 MG tablet TAKE 1 TABLET BY MOUTH EVERY DAY 90 tablet 1  . metFORMIN (GLUCOPHAGE) 1000 MG tablet TAKE 1 TABLET BY MOUTH TWICE A DAY WITH MEALS 180 tablet 1  . mupirocin cream (BACTROBAN) 2 % Apply 1 application topically 2 (two) times daily. (Patient taking differently: Apply 1 application topically daily as needed (skin irritation). ) 15 g 0  . pioglitazone (ACTOS) 30 MG tablet Take 1 tablet (30 mg total) by mouth daily. Stop Tradjenta 90 tablet 3  . sildenafil (VIAGRA) 100 MG tablet TAKE 1/2 OR 1 TABLET BY MOUTH DAILY AS NEEDED FOR ERECTILE DYSFUNCTION 3 tablet 39  . vedolizumab (ENTYVIO) 300 MG injection Inject 300 mg into the vein every 8 (eight) weeks.      No current facility-administered medications for this visit.     Physical Exam:     BP 118/64   Pulse 72   Temp 98 F (36.7 C) (Temporal)   Ht 5' 10"  (1.778 m)   Wt 226 lb (102.5 kg)   BMI 32.43 kg/m   GENERAL:  Pleasant male in NAD PSYCH: : Cooperative, normal affect EENT:  conjunctiva pink, mucous membranes moist, neck supple without masses CARDIAC:  RRR,  no peripheral edema PULM: Normal respiratory effort, lungs CTA bilaterally, no wheezing ABDOMEN:  Nondistended, soft, nontender. No obvious masses, no hepatomegaly,  normal bowel sounds SKIN:  turgor, no lesions seen Musculoskeletal:  Normal  muscle tone, normal strength NEURO: Alert and oriented x 3, no focal neurologic deficits   Tye Savoy , NP 06/07/2019, 11:53 AM

## 2019-06-08 NOTE — Telephone Encounter (Signed)
Spoke with patient this morning. Patient advised to hold plavix five days prior to procedure per [name]. Patient verbalized understanding.

## 2019-06-21 LAB — HM DIABETES EYE EXAM

## 2019-06-24 ENCOUNTER — Ambulatory Visit (INDEPENDENT_AMBULATORY_CARE_PROVIDER_SITE_OTHER): Payer: 59

## 2019-06-24 ENCOUNTER — Other Ambulatory Visit: Payer: Self-pay | Admitting: Gastroenterology

## 2019-06-24 ENCOUNTER — Encounter: Payer: Self-pay | Admitting: Gastroenterology

## 2019-06-24 DIAGNOSIS — Z1159 Encounter for screening for other viral diseases: Secondary | ICD-10-CM

## 2019-06-25 LAB — SARS CORONAVIRUS 2 (TAT 6-24 HRS): SARS Coronavirus 2: NEGATIVE

## 2019-06-28 ENCOUNTER — Encounter: Payer: Self-pay | Admitting: Gastroenterology

## 2019-06-28 ENCOUNTER — Ambulatory Visit (AMBULATORY_SURGERY_CENTER): Payer: 59 | Admitting: Gastroenterology

## 2019-06-28 ENCOUNTER — Other Ambulatory Visit: Payer: Self-pay

## 2019-06-28 VITALS — BP 94/51 | HR 49 | Temp 97.9°F | Resp 16 | Ht 70.0 in | Wt 226.0 lb

## 2019-06-28 DIAGNOSIS — K621 Rectal polyp: Secondary | ICD-10-CM

## 2019-06-28 DIAGNOSIS — K51919 Ulcerative colitis, unspecified with unspecified complications: Secondary | ICD-10-CM | POA: Diagnosis not present

## 2019-06-28 DIAGNOSIS — K5140 Inflammatory polyps of colon without complications: Secondary | ICD-10-CM | POA: Diagnosis not present

## 2019-06-28 DIAGNOSIS — D122 Benign neoplasm of ascending colon: Secondary | ICD-10-CM

## 2019-06-28 DIAGNOSIS — K5150 Left sided colitis without complications: Secondary | ICD-10-CM | POA: Diagnosis not present

## 2019-06-28 DIAGNOSIS — D128 Benign neoplasm of rectum: Secondary | ICD-10-CM

## 2019-06-28 DIAGNOSIS — D124 Benign neoplasm of descending colon: Secondary | ICD-10-CM

## 2019-06-28 MED ORDER — SODIUM CHLORIDE 0.9 % IV SOLN: 500.0000 mL | Freq: Once | INTRAVENOUS | Status: DC

## 2019-06-28 NOTE — Patient Instructions (Signed)
Thank you for letting us take care of your healthcare needs today. Please see handouts given to you on Polyps and Hemorrhoids. You may resume your Plavix at prior dose on Thursday. No Ibuprofen, Advil, Aspirin, NSAIDS for 2 weeks.   YOU HAD AN ENDOSCOPIC PROCEDURE TODAY AT [facility]:   Refer to the procedure report that was given to you for any specific questions about what was found during the examination.  If the procedure report does not answer your questions, please call your gastroenterologist to clarify.  If you requested that your care partner not be given the details of your procedure findings, then the procedure report has been included in a sealed envelope for you to review at your convenience later.  YOU SHOULD EXPECT: Some feelings of bloating in the abdomen. Passage of more gas than usual.  Walking can help get rid of the air that was put into your GI tract during the procedure and reduce the bloating. If you had a lower endoscopy (such as a colonoscopy or flexible sigmoidoscopy) you may notice spotting of blood in your stool or on the toilet paper. If you underwent a bowel prep for your procedure, you may not have a normal bowel movement for a few days.  Please Note:  You might notice some irritation and congestion in your nose or some drainage.  This is from the oxygen used during your procedure.  There is no need for concern and it should clear up in a day or so.  SYMPTOMS TO REPORT IMMEDIATELY:   Following lower endoscopy (colonoscopy or flexible sigmoidoscopy):  Excessive amounts of blood in the stool  Significant tenderness or worsening of abdominal pains  Swelling of the abdomen that is new, acute  Fever of 100F or higher  For urgent or emergent issues, a gastroenterologist can be reached at any hour by calling [phone].   DIET:  We do recommend a small meal at first, but then you may proceed to your regular diet.  Drink plenty of fluids but you  should avoid alcoholic beverages for 24 hours.  ACTIVITY:  You should plan to take it easy for the rest of today and you should NOT DRIVE or use heavy machinery until tomorrow (because of the sedation medicines used during the test).    FOLLOW UP: Our staff will call the number listed on your records 48-72 hours following your procedure to check on you and address any questions or concerns that you may have regarding the information given to you following your procedure. If we do not reach you, we will leave a message.  We will attempt to reach you two times.  During this call, we will ask if you have developed any symptoms of COVID 19. If you develop any symptoms (ie: fever, flu-like symptoms, shortness of breath, cough etc.) before then, please call [phone].  If you test positive for Covid 19 in the 2 weeks post procedure, please call and report this information to [place].    If any biopsies were taken you will be contacted by phone or by letter within the next 1-3 weeks.  Please call us at [phone] if you have not heard about the biopsies in 3 weeks.    SIGNATURES/CONFIDENTIALITY: You and/or your care partner have signed paperwork which will be entered into your electronic medical record.  These signatures attest to the fact that that the information above on your After Visit Summary has been reviewed and is understood.  Full  responsibility of the confidentiality of this discharge information lies with you and/or your care-partner.

## 2019-06-28 NOTE — Progress Notes (Signed)
A and O x3. Report to RN. Tolerated MAC anesthesia well.

## 2019-06-28 NOTE — Progress Notes (Signed)
Temp taken by JB VS taken by CW

## 2019-06-28 NOTE — Progress Notes (Signed)
Called to room to assist during endoscopic procedure.  Patient ID and intended procedure confirmed with present staff. Received instructions for my participation in the procedure from the performing physician.  

## 2019-06-28 NOTE — Op Note (Signed)
[facility] Patient Name: Matthew Chen Procedure Date: 06/28/2019 8:11 AM MRN: [id number] Endoscopist: [name] , MD Age: 63 Referring MD:  Date of Birth: 09-Sep-1955 Gender: Male Account #: [id number] Procedure:                Colonoscopy Indications:              High risk colon cancer surveillance: Ulcerative                            left sided colitis of 8 (or more) years duration Medicines:                Monitored Anesthesia Care Procedure:                Pre-Anesthesia Assessment:                           - Prior to the procedure, a History and Physical                            was performed, and patient medications and                            allergies were reviewed. The patient's tolerance of                            previous anesthesia was also reviewed. The risks                            and benefits of the procedure and the sedation                            options and risks were discussed with the patient.                            All questions were answered, and informed consent                            was obtained. Prior Anticoagulants: The patient has                            taken Plavix (clopidogrel), last dose was 5 days                            prior to procedure. ASA Grade Assessment: III - A                            patient with severe systemic disease. After                            reviewing the risks and benefits, the patient was                            deemed in satisfactory condition to undergo the  procedure.                           After obtaining informed consent, the colonoscope                            was passed under direct vision. Throughout the                            procedure, the patient's blood pressure, pulse, and                            oxygen saturations were monitored continuously. The                            Colonoscope was introduced through the anus and                           advanced to the the cecum, identified by                            appendiceal orifice and ileocecal valve. The                            ileocecal valve, appendiceal orifice, and rectum                            were photographed. The quality of the bowel                            preparation was good. The colonoscopy was performed                            without difficulty. The patient tolerated the                            procedure well. Scope In: 8:15:55 AM Scope Out: 8:41:00 AM Scope Withdrawal Time: 0 hours 23 minutes 23 seconds  Total Procedure Duration: 0 hours 25 minutes 5 seconds  Findings:                 The perianal and digital rectal examinations were                            normal.                           A 7 mm polyp was found in the ascending colon. The                            polyp was sessile. The polyp was removed with a                            cold snare. Resection and retrieval were complete.  Five semi-pedunculated polyps were found in the                            rectum (2) and descending colon (3). The polyps                            were 9 to 12 mm in size. These polyps were removed                            with a hot snare. Resection and retrieval were                            complete. The proximal rectal polyp had persisent                            bleeding. For hemostasis, two hemostatic clips were                            successfully placed (MR conditional). There was no                            bleeding at the end of the procedure.                           Segmental mild mucosal changes characterized by                            erythema, loss of vascularity, pseudopolyps and                            scarring were found in the rectum, in the sigmoid                            colon and in the descending colon. Biopsies were                            taken with a cold  forceps for histology.                           Internal hemorrhoids were found during                            retroflexion. The hemorrhoids were small and Grade                            I (internal hemorrhoids that do not prolapse).                           The exam was otherwise without abnormality on                            direct and retroflexion views. Random biopsied were  taken witha cold forceps for histology. Complications:            No immediate complications. Estimated blood loss:                            None. Estimated Blood Loss:     Estimated blood loss: none. Impression:               - One 7 mm polyp in the ascending colon, removed                            with a cold snare. Resected and retrieved.                           - Five 9 to 12 mm polyps in the rectum and in the                            descending colon, removed with a hot snare.                            Resected and retrieved. Persistent bleeding at                            proximal rectal polypectomy. 2 clips (MR                            conditional) were placed.                           - Segmental mild mucosal changes were found in the                            rectum, in the sigmoid colon and in the descending                            colon. Biopsied.                           - Internal hemorrhoids.                           - The examination was otherwise normal on direct                            and retroflexion views. Biopsied. Recommendation:           - Repeat colonoscopy in 2 years for surveillance                            pending pathology review.                           - Resume Plavix (clopidogrel) in 3 days at prior                            dose. Refer to managing  physician for further                            adjustment of therapy.                           - Patient has a contact number available for                             emergencies. The signs and symptoms of potential                            delayed complications were discussed with the                            patient. Return to normal activities tomorrow.                            Written discharge instructions were provided to the                            patient.                           - Resume previous diet.                           - Continue present medications.                           - Await pathology results.                           - No aspirin, ibuprofen, naproxen, or other                            non-steroidal anti-inflammatory drugs for 2 weeks                            after polyp removal. [name], MD 06/28/2019 8:49:40 AM This report has been signed electronically.

## 2019-06-30 ENCOUNTER — Telehealth: Payer: Self-pay

## 2019-06-30 NOTE — Telephone Encounter (Signed)
  Follow up Call-  Call back number 06/28/2019 06/10/2017  Post procedure Call Back phone  # [phone]  Permission to leave phone message Yes Yes  Some recent data might be hidden     Patient questions:  Do you have a fever, pain , or abdominal swelling? No. Pain Score  0 *  Have you tolerated food without any problems? Yes.    Have you been able to return to your normal activities? Yes.    Do you have any questions about your discharge instructions: Diet   No. Medications  No. Follow up visit  No.  Do you have questions or concerns about your Care? No.  Actions: * If pain score is 4 or above: 1. No action needed, pain <4.Have you developed a fever since your procedure? no  2.   Have you had an respiratory symptoms (SOB or cough) since your procedure? no  3.   Have you tested positive for COVID 19 since your procedure no  4.   Have you had any family members/close contacts diagnosed with the COVID 19 since your procedure?  no   If yes to any of these questions please route to Joylene John, RN and Alphonsa Gin, Therapist, sports.

## 2019-06-30 NOTE — Telephone Encounter (Signed)
  Follow up Call-  Call back number 06/28/2019 06/10/2017  Post procedure Call Back phone  # [phone]  Permission to leave phone message Yes Yes  Some recent data might be hidden     Patient questions:  Do you have a fever, pain , or abdominal swelling? No. Pain Score  0 *  Have you tolerated food without any problems? Yes.    Have you been able to return to your normal activities? Yes.    Do you have any questions about your discharge instructions: Diet   No. Medications  No. Follow up visit  No.  Do you have questions or concerns about your Care? No.  Actions: * If pain score is 4 or above: No action needed, pain <4.

## 2019-07-01 ENCOUNTER — Encounter: Payer: Self-pay | Admitting: Gastroenterology

## 2019-07-26 ENCOUNTER — Encounter: Payer: Self-pay | Admitting: Cardiology

## 2019-07-26 ENCOUNTER — Other Ambulatory Visit: Payer: Self-pay

## 2019-07-26 ENCOUNTER — Other Ambulatory Visit: Payer: Self-pay | Admitting: Family Medicine

## 2019-07-26 ENCOUNTER — Ambulatory Visit (INDEPENDENT_AMBULATORY_CARE_PROVIDER_SITE_OTHER): Payer: 59 | Admitting: Cardiology

## 2019-07-26 VITALS — BP 129/68 | HR 63 | Ht 70.0 in | Wt 224.0 lb

## 2019-07-26 DIAGNOSIS — Q231 Congenital insufficiency of aortic valve: Secondary | ICD-10-CM

## 2019-07-26 DIAGNOSIS — R0989 Other specified symptoms and signs involving the circulatory and respiratory systems: Secondary | ICD-10-CM | POA: Diagnosis not present

## 2019-07-26 DIAGNOSIS — I209 Angina pectoris, unspecified: Secondary | ICD-10-CM

## 2019-07-26 DIAGNOSIS — I739 Peripheral vascular disease, unspecified: Secondary | ICD-10-CM

## 2019-07-26 DIAGNOSIS — I77810 Thoracic aortic ectasia: Secondary | ICD-10-CM | POA: Diagnosis not present

## 2019-07-26 MED ORDER — NITROGLYCERIN 0.4 MG SL SUBL: 0.4000 mg | SUBLINGUAL_TABLET | SUBLINGUAL | 0 refills | Status: DC | PRN

## 2019-07-26 NOTE — Progress Notes (Signed)
Primary Physician/Referring:  Susy Frizzle, MD  Patient ID: Matthew Chen, male    DOB: 12-Mar-1956, 63 y.o.   MRN: [id number]  Chief Complaint  Patient presents with  . Chest Pain    when walking up a hill/exercising   . Hypertension  . New Patient (Initial Visit)   HPI:    Matthew Chen  is a 63 y.o. Caucasian male with peripheral arterial disease, stage III chronic kidney disease, diabetes mellitus type 2, uncontrolled, hypertension, hyperlipidemia referred to me for evaluation of chest tightness.  Patient states that since 6-8 months or a year or so, he has started noticing chest tightness in the middle of chest when he walks and the chest uphill.  Symptoms improved after he rests or gets on prophylactic ground, he has not had any rest pain.  No other associated symptoms.  States that symptoms of claudication have essentially resolved since angioplasty to his lower extremities.  Past Medical History:  Diagnosis Date  . Adenomatous colon polyp 03/2008  . Aortic aneurysm, thoracic ([place]) 09/09/2016   4.4 cm by echo 05/2015  . Arthritis    "joints in my fingers ache" (07/13/2015)  . Bicuspid aortic valve 09/09/2016  . Cataract 2019   bilateral eyes  . CKD (chronic kidney disease), stage III   . Clotting disorder ([place])    on plavix for legs  . Diabetes mellitus without complication ([place])   . Hemorrhoids   . Hyperlipidemia   . Hypertension   . Peripheral arterial disease ([place])    a. dopppler 05/29/2015 revealed high-grade right popliteal and distal left SFA disease b. LE angio 06/28/2015 revealing high grade calcific dx with distal L SFA and R popliteal artery s/p diamondback orbital rotational atherectomy, PTA of L SFA  c. 07/13/2015 diamondback orbital rotational atherectomy and drug eluting balloon angioplasty of R popliteal artery (P2 segment) using distal protection   . Type II diabetes mellitus ([place])   . Ulcerative colitis    Past Surgical History:  Procedure Laterality  Date  . ABDOMINAL AORTOGRAM W/LOWER EXTREMITY N/A 04/27/2018   Procedure: ABDOMINAL AORTOGRAM W/LOWER EXTREMITY;  Surgeon: Lorretta Harp, MD;  Location: [facility];  Service: Cardiovascular;  Laterality: N/A;  . COLONOSCOPY W/ POLYPECTOMY  X 4-5  . COSMETIC SURGERY  < 2000   "removed birthmark from top of my head"  . PERIPHERAL VASCULAR ATHERECTOMY  04/27/2018   Procedure: PERIPHERAL VASCULAR ATHERECTOMY;  Surgeon: Lorretta Harp, MD;  Location: [facility];  Service: Cardiovascular;;  right popliteal artery  . PERIPHERAL VASCULAR CATHETERIZATION N/A 06/29/2015   Procedure: Lower Extremity Angiography;  Surgeon: Lorretta Harp, MD;  Location: [facility];  Service: Cardiovascular;  Laterality: N/A;  . PERIPHERAL VASCULAR CATHETERIZATION N/A 07/13/2015   Procedure: Lower Extremity Angiography;  Surgeon: Lorretta Harp, MD;  Location: [facility];  Service: Cardiovascular;  Laterality: N/A;  . PERIPHERAL VASCULAR CATHETERIZATION  07/13/2015   Procedure: Peripheral Vascular Atherectomy;  Surgeon: Lorretta Harp, MD;  Location: [facility];  Service: Cardiovascular;;  . PERIPHERAL VASCULAR INTERVENTION  04/27/2018   Procedure: PERIPHERAL VASCULAR INTERVENTION;  Surgeon: Lorretta Harp, MD;  Location: [facility];  Service: Cardiovascular;;  right popliteal artery  . TONSILLECTOMY     Social History   Socioeconomic History  . Marital status: Married    Spouse name: Not on file  . Number of children: 2  . Years of education: Not on file  .  Highest education level: Not on file  Occupational History  . Occupation: Truck Education administrator: [employer]  Tobacco Use  . Smoking status: Former Smoker    Packs/day: 1.50    Years: 25.00    Pack years: 37.50    Types: Cigarettes    Quit date: 08/12/1996    Years since quitting: 22.9  . Smokeless tobacco: Never Used  Substance and Sexual Activity  . Alcohol use: Yes     Alcohol/week: 1.0 standard drinks    Types: 1 Cans of beer per week    Comment: social use  . Drug use: No  . Sexual activity: Yes  Other Topics Concern  . Not on file  Social History Narrative   ** Merged History Encounter **       Social Determinants of Health   Financial Resource Strain:   . Difficulty of Paying Living Expenses: Not on file  Food Insecurity:   . Worried About Charity fundraiser in the Last Year: Not on file  . Ran Out of Food in the Last Year: Not on file  Transportation Needs:   . Lack of Transportation (Medical): Not on file  . Lack of Transportation (Non-Medical): Not on file  Physical Activity:   . Days of Exercise per Week: Not on file  . Minutes of Exercise per Session: Not on file  Stress:   . Feeling of Stress : Not on file  Social Connections:   . Frequency of Communication with Friends and Family: Not on file  . Frequency of Social Gatherings with Friends and Family: Not on file  . Attends Religious Services: Not on file  . Active Member of Clubs or Organizations: Not on file  . Attends Archivist Meetings: Not on file  . Marital Status: Not on file  Intimate Partner Violence:   . Fear of Current or Ex-Partner: Not on file  . Emotionally Abused: Not on file  . Physically Abused: Not on file  . Sexually Abused: Not on file   ROS  Review of Systems  Constitution: Negative for chills, decreased appetite, malaise/fatigue and weight gain.  Cardiovascular: Positive for chest pain. Negative for claudication, dyspnea on exertion, leg swelling and syncope.  Endocrine: Negative for cold intolerance.  Hematologic/Lymphatic: Does not bruise/bleed easily.  Musculoskeletal: Negative for joint swelling.  Gastrointestinal: Negative for abdominal pain, anorexia, change in bowel habit, hematochezia and melena.  Neurological: Negative for headaches and light-headedness.  Psychiatric/Behavioral: Negative for depression and substance abuse.  All  other systems reviewed and are negative.  Objective  Blood pressure 129/68, pulse 63, height 5' 10" (1.778 m), weight 224 lb (101.6 kg), SpO2 96 %.  Vitals with BMI 07/26/2019 06/28/2019 06/28/2019  Height 5' 10" - -  Weight 224 lbs - -  BMI 70.17 - -  Systolic 793 94 903  Diastolic 68 51 51  Pulse 63 49 49      Physical Exam  Constitutional:  He is well-built and mildly obese in no acute distress.  HENT:  Head: Atraumatic.  Eyes: Conjunctivae are normal.  Neck: No JVD present. No thyromegaly present.  Cardiovascular: Normal rate, regular rhythm, S1 normal and S2 normal. Exam reveals no gallop.  Murmur heard.  Harsh midsystolic murmur is present with a grade of 2/6 at the upper right sternal border and apex radiating to the neck. Pulses:      Carotid pulses are 2+ on the right side with bruit and 2+ on  the left side with bruit.      Radial pulses are 2+ on the right side and 2+ on the left side.       Femoral pulses are 2+ on the right side and 2+ on the left side.      Popliteal pulses are 2+ on the right side and 2+ on the left side.       Dorsalis pedis pulses are 2+ on the right side and 2+ on the left side.       Posterior tibial pulses are 0 on the right side and 2+ on the left side.  There is no JVD, no leg edema.  Except for absent left PT pulse, all pulses are normal.  Bilateral carotid bruit present.  Pulmonary/Chest: Effort normal and breath sounds normal.  Abdominal: Soft. Bowel sounds are normal.  Musculoskeletal:        General: No edema. Normal range of motion.     Cervical back: Neck supple.  Neurological: He is alert.  Skin: Skin is warm and dry.  Psychiatric: He has a normal mood and affect.   Laboratory examination:   Recent Labs    01/18/19 0816 05/03/19 0840  NA 137 141  K 4.7 4.4  CL 105 105  CO2 23 25  GLUCOSE 151* 121*  BUN 26* 24  CREATININE 1.53* 1.56*  CALCIUM 9.4 9.5  GFRNONAA  --  47*  GFRAA  --  54*   CrCl cannot be calculated  (Patient's most recent lab result is older than the maximum 21 days allowed.).  CMP Latest Ref Rng & Units 05/03/2019 01/18/2019 04/30/2018  Glucose 65 - 99 mg/dL 121(H) 151(H) 179(H)  BUN 7 - 25 mg/dL 24 26(H) 24(H)  Creatinine 0.70 - 1.25 mg/dL 1.56(H) 1.53(H) 1.34  Sodium 135 - 146 mmol/L 141 137 142  Potassium 3.5 - 5.3 mmol/L 4.4 4.7 3.6  Chloride 98 - 110 mmol/L 105 105 103  CO2 20 - 32 mmol/L _0 Calcium 8.6 - 10.3 mg/dL 9.5 9.4 9.8  Total Protein 6.1 - 8.1 g/dL 6.8 6.7 6.8  Total Bilirubin 0.2 - 1.2 mg/dL 0.5 0.4 0.5  Alkaline Phos 39 - 117 U/L - - 52  AST 10 - 35 U/L _1 ALT 9 - 46 U/L _2 CBC Latest Ref Rng & Units 05/03/2019 01/18/2019 04/30/2018  WBC 3.8 - 10.8 Thousand/uL 4.4 5.1 7.3  Hemoglobin 13.2 - 17.1 g/dL 11.7(L) 12.3(L) 11.8(L)  Hematocrit 38.5 - 50.0 % 35.1(L) 38.0(L) 35.4(L)  Platelets 140 - 400 Thousand/uL 247 291 262.0   Lipid Panel     Component Value Date/Time   CHOL 124 05/03/2019 0840   CHOL 119 10/07/2016 0843   TRIG 182 (H) 05/03/2019 0840   HDL 35 (L) 05/03/2019 0840   HDL 26 (L) 10/07/2016 0843   CHOLHDL 3.5 05/03/2019 0840   VLDL 47 (H) 11/25/2016 0838   LDLCALC 63 05/03/2019 0840   HEMOGLOBIN A1C Lab Results  Component Value Date   HGBA1C 6.9 (H) 05/03/2019   MPG 151 05/03/2019   TSH No results for input(s): TSH in the last 8760 hours. Medications and allergies   Allergies  Allergen Reactions  . Penicillins Other (See Comments)    Unsure, childhood reaction Has patient had a PCN reaction causing immediate rash, facial/tongue/throat swelling, SOB or lightheadedness with hypotension: Unknown Has patient had a PCN reaction causing severe rash involving mucus membranes or skin necrosis: Unknown Has patient had a  PCN reaction that required hospitalization: No Has patient had a PCN reaction occurring within the last 10 years: No If all of the above answers are "NO", then may proceed with Cephalosporin use.      Current  Outpatient Medications  Medication Instructions  . amLODipine (NORVASC) 10 MG tablet TAKE 1 TABLET BY MOUTH EVERY DAY  . aspirin EC 81 mg, Oral, Daily  . atorvastatin (LIPITOR) 40 MG tablet TAKE 1 TABLET BY MOUTH EVERY DAY  . blood glucose meter kit and supplies Dispense based on patient and insurance preference. Check BS daily. 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 Needs strips and lancets #100 /5 refills  . carvedilol (COREG) 25 MG tablet TAKE 1 TABLET BY MOUTH TWICE A DAY  . clopidogrel (PLAVIX) 75 MG tablet TAKE 1 TABLET BY MOUTH DAILY WITH BREAKFAST.  . fenofibrate 160 MG tablet TAKE 1 TABLET BY MOUTH EVERY DAY  . glucose blood (ONE TOUCH ULTRA TEST) test strip Check BS bid  . Insulin Pen Needle (B-D UF III MINI PEN NEEDLES) 31G X 5 MM MISC USE DAILY WITH VICTOZA  . liraglutide (VICTOZA) 1.2 mg, Subcutaneous, Daily  . lisinopril-hydrochlorothiazide (ZESTORETIC) 20-25 MG tablet TAKE 1 TABLET BY MOUTH EVERY DAY  . metFORMIN (GLUCOPHAGE) 1000 MG tablet TAKE 1 TABLET BY MOUTH TWICE A DAY WITH MEALS  . nitroGLYCERIN (NITROSTAT) 0.4 mg, Sublingual, Every 5 min PRN  . pioglitazone (ACTOS) 30 mg, Oral, Daily, Stop Tradjenta    Radiology:  No results found.  Cardiac Studies:   Echocardiogram 04/20/2019:   1. The left ventricle has normal systolic function with an ejection fraction of 60-65%. The cavity size was normal. Left ventricular diastolic Doppler parameters are consistent with pseudonormalization.  2. The right ventricle has normal systolic function. The cavity was normal. There is no increase in right ventricular wall thickness.  3. No evidence of mitral valve stenosis.  4. The aortic valve is bicuspid. Moderate calcification of the aortic valve.  5. The aorta is abnormal unless otherwise noted.  6. There is mild to moderate dilatation of the aortic root and of the ascending aorta at 4.3 cm..  Assessment     ICD-10-CM   1. Angina pectoris (HCC)  I20.9 EKG 12-Lead    nitroGLYCERIN (NITROSTAT)  0.4 MG SL tablet  2. Aortic root dilatation (HCC)  I77.810   3. Bicuspid aortic valve with ascending aorta greater than 4.0 cm in diameter  Q23.1   4. Bilateral carotid bruits  R09.89   5. Claudication in peripheral vascular disease ([place])  I73.9     EKG 07/26/2019: Normal sinus rhythm at rate of 61 bpm, normal axis, no evidence of ischemia.  Low-voltage complexes.    Recommendations:   Meds ordered this encounter  Medications  . nitroGLYCERIN (NITROSTAT) 0.4 MG SL tablet    Sig: Place 1 tablet (0.4 mg total) under the tongue every 5 (five) minutes as needed for up to 25 days for chest pain.    Dispense:  25 tablet    Refill:  0    Matthew Chen  is a 63 y.o. Caucasian male with peripheral arterial disease S/P right popliteal stenting on 04/27/2018 and left SFA PTA in 2016, stage III chronic kidney disease, diabetes mellitus type 2, uncontrolled, hypertension, hyperlipidemia referred to me for evaluation of chest tightness.  Patient's symptoms of chest discomfort are clearly suggestive of stable class II angina pectoris.  He needs further evaluation including nuclear stress test.  S/L NTG was prescribed and explained how to and  when to use it and to notify us if there is change in frequency of use. Interaction with cialis-like agents (takes Viagra PRN) was discussed to hold for 24 hours.    Patient is aware that he has bicuspid aortic valve, aortic root dilatation was also discussed.  Could consider changing lisinopril to losartan and also carotid duplex for bruit if necessary.  Patient has established relationship with Dr. Quay Burow, I'll forward him a copy of my office note and also will request him to set up a stress test and follow-up if he feels is appropriate. No further appointments were made with me. Patient is presently on appropriate medical therapy.  Lipids are well controlled, Elevated triglycerides are related to his poor diet, discussed making died changes.  Otherwise he is  on appropriate medical therapy as well. D/W [name].   Adrian Prows, MD, [facility] 07/26/2019, 5:01 PM [facility] Pager: [phone] Office: [phone]  CC: Quay Burow, MD

## 2019-07-28 ENCOUNTER — Telehealth: Payer: Self-pay

## 2019-07-28 NOTE — Telephone Encounter (Signed)
Called and made appt with Dr Gwenlyn Found for 1/8.

## 2019-08-02 ENCOUNTER — Other Ambulatory Visit: Payer: Self-pay | Admitting: Physician Assistant

## 2019-08-02 ENCOUNTER — Other Ambulatory Visit: Payer: Self-pay | Admitting: Cardiovascular Disease

## 2019-08-20 ENCOUNTER — Ambulatory Visit (INDEPENDENT_AMBULATORY_CARE_PROVIDER_SITE_OTHER): Payer: 59 | Admitting: Cardiovascular Disease

## 2019-08-20 ENCOUNTER — Encounter: Payer: Self-pay | Admitting: Cardiovascular Disease

## 2019-08-20 ENCOUNTER — Other Ambulatory Visit: Payer: Self-pay

## 2019-08-20 VITALS — BP 146/80 | HR 54 | Temp 96.0°F | Ht 70.0 in | Wt 230.0 lb

## 2019-08-20 DIAGNOSIS — Q231 Congenital insufficiency of aortic valve: Secondary | ICD-10-CM

## 2019-08-20 DIAGNOSIS — I10 Essential (primary) hypertension: Secondary | ICD-10-CM

## 2019-08-20 DIAGNOSIS — I77810 Thoracic aortic ectasia: Secondary | ICD-10-CM

## 2019-08-20 DIAGNOSIS — I712 Thoracic aortic aneurysm, without rupture, unspecified: Secondary | ICD-10-CM

## 2019-08-20 DIAGNOSIS — E785 Hyperlipidemia, unspecified: Secondary | ICD-10-CM

## 2019-08-20 DIAGNOSIS — R0989 Other specified symptoms and signs involving the circulatory and respiratory systems: Secondary | ICD-10-CM | POA: Diagnosis not present

## 2019-08-20 DIAGNOSIS — I739 Peripheral vascular disease, unspecified: Secondary | ICD-10-CM

## 2019-08-20 NOTE — Patient Instructions (Signed)
Medication Instructions:  Stop taking Plavix.  If you need a refill on your cardiac medications before your next appointment, please call your pharmacy.   Lab work: NONE  Testing/Procedures:  Your physician has requested that you have an echocardiogram in September. Echocardiography is a painless test that uses sound waves to create images of your heart. It provides your doctor with information about the size and shape of your heart and how well your heart's chambers and valves are working. This procedure takes approximately one hour. There are no restrictions for this procedure. [place] has requested that you have a lower extremity arterial exercise duplex in [place]. During this test, exercise and ultrasound are used to evaluate arterial blood flow in the legs. Allow one hour for this exam. There are no restrictions or special instructions.  AND  Your physician has requested that you have an ankle brachial index (ABI) in [place]. During this test an ultrasound and blood pressure cuff are used to evaluate the arteries that supply the arms and legs with blood. Allow thirty minutes for this exam. There are no restrictions or special instructions.  AND  Your physician has requested that you have a carotid duplex in [place]. This test is an ultrasound of the carotid arteries in your neck. It looks at blood flow through these arteries that supply the brain with blood. Allow one hour for this exam. There are no restrictions or special instructions.   Follow-Up: At [facility], you and your health needs are our priority.  As part of our continuing mission to provide you with exceptional heart care, we have created designated Provider Care Teams.  These Care Teams include your primary Cardiologist (physician) and Advanced Practice Providers (APPs -  Physician Assistants and Nurse Practitioners) who all work together to provide you with the care you need,  when you need it. You may see Dr Gwenlyn Found or one of the following Advanced Practice Providers on your designated Care Team:    Kerin Ransom, [name]  [place], [place]  Coletta Memos, [place]  Your physician wants you to follow-up in: 1 year with Dr Gwenlyn Found

## 2019-08-20 NOTE — Assessment & Plan Note (Signed)
History of dyslipidemia on statin therapy with lipid profile performed 05/03/2019 revealing total cholesterol of 124, LDL 63 and HDL 35.

## 2019-08-20 NOTE — Assessment & Plan Note (Signed)
History of thoracic aortic dilatation of 4.3 cm.  We will recheck this in 12 months.

## 2019-08-20 NOTE — Assessment & Plan Note (Signed)
History of peripheral arterial disease status post diamondback orbital rotational atherectomy, drug coated balloon angioplasty of the distal left SFA for high-grade subocclusive calcific, exophytic plaque 06/29/2015.  He did have high-grade calcific/exophytic subocclusive right popliteal artery plaque which he underwent diamondback orbital rotational atherectomy, drug-coated balloon angioplasty 07/13/2015.  I angiogrammed him 04/27/2018 revealing a 95% calcified right popliteal artery stenosis performed diamondback atherectomy, PTA and stenting using a Tigris 7 mm x 4040 mm long self-expanding stent with excellent result.  His Dopplers normalized and his claudication resolved.  His most recent Dopplers performed 11/24/2018 revealed a right ABI of 1.24 and a left which was the same with widely patent stents on the right.  He denies claudication.

## 2019-08-20 NOTE — Assessment & Plan Note (Signed)
History of essential hypertension blood pressure measured today 146/80.  He is on amlodipine, carvedilol, lisinopril and hydrochlorothiazide.

## 2019-08-20 NOTE — Assessment & Plan Note (Signed)
History of bicuspid aortic valve without stenosis and normal LV function by echo performed 04/20/2019.  We will recheck this in 12 months.

## 2019-08-20 NOTE — Progress Notes (Signed)
08/20/2019 Matthew Chen   12/15/1955  [id number]  Primary Physician Pickard, Cammie Mcgee, MD Primary Cardiologist: Lorretta Harp MD Garret Reddish, [place], [place]  HPI:  Matthew Chen is a 64 y.o.  mild to moderately overweight married Caucasian male father of 4 children, grandfather of a grandchildren was referred by Dr. Skeet Latch for evaluation and treatment of claudication. I last saw him in the office  05/19/2018.He has a history of treated hypertension, diabetes and hyperlipidemia. 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 He's never had a heart attack or stroke and denies chest pain or shortness of breath. He does complain of lifestyle limiting claudication and had arterial Doppler studies performed in our office 05/29/15 revealing high-grade right popliteal and distal left SFA disease. Based on this, we decided to proceed with angiography and potential percutaneous revascularization. I initially angiogrammed him on 06/29/15 and performed diamondback orbital rotational atherectomy, drug eluting balloon angioplasty of the distal left SFA for a high-grade, subocclusive calcific/exophytic plaque. He had a high-grade calcific/exophytic subocclusive right popliteal artery plaque for which he underwent diamondback orbital rotation arthrectomy, drug eluding balloon angioplasty on 07/13/15 with an excellent angiographic result. Since that time his claudication has resolved. His Dopplers performed8/6/2019revealed normal ABIs bilaterally with the development of a high-frequency signal in his right popliteal artery.Since I saw him 8 months ago he has developed lifestyle limiting right calf claudication.  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  He had 30 to 40% mid left SFA stenosis.  I performed diamondback orbital rotational atherectomy, PTA and stenting using a Tigris 7 mm x  40 mm long self-expanding stent.  His Dopplers normalized and his claudication has resolved.  Since I saw him a year ago he continues to do well.  He denies chest pain, shortness of breath or claudication.  His Dopplers performed in April 2020 revealed his right SFA stent to be widely patent.  Echo performed 04/20/2019 revealed a bicuspid aortic valve with normal LV function and a moderately dilated aortic root.   Current Meds  Medication Sig  . amLODipine (NORVASC) 10 MG tablet TAKE 1 TABLET BY MOUTH EVERY DAY  . aspirin EC 81 MG tablet Take 1 tablet (81 mg total) by mouth daily.  Marland Kitchen atorvastatin (LIPITOR) 40 MG tablet TAKE 1 TABLET BY MOUTH EVERY DAY  . blood glucose meter kit and supplies Dispense based on patient and insurance preference. Check BS daily. 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 Needs strips and lancets #100 /5 refills  . carvedilol (COREG) 25 MG tablet TAKE 1 TABLET BY MOUTH TWICE A DAY  . fenofibrate 160 MG tablet TAKE 1 TABLET BY MOUTH EVERY DAY  . glucose blood (ONE TOUCH ULTRA TEST) test strip Check BS bid  . Insulin Pen Needle (B-D UF III MINI PEN NEEDLES) 31G X 5 MM MISC USE DAILY WITH VICTOZA  . liraglutide (VICTOZA) 18 MG/3ML SOPN Inject 0.2 mLs (1.2 mg total) into the skin daily.  Marland Kitchen lisinopril-hydrochlorothiazide (ZESTORETIC) 20-25 MG tablet TAKE 1 TABLET BY MOUTH EVERY DAY  . metFORMIN (GLUCOPHAGE) 1000 MG tablet TAKE 1 TABLET BY MOUTH TWICE A DAY WITH MEALS  . pioglitazone (ACTOS) 30 MG tablet Take 1 tablet (30 mg total) by mouth daily. Stop Tradjenta  . [DISCONTINUED] clopidogrel (PLAVIX) 75 MG tablet TAKE 1 TABLET BY MOUTH DAILY WITH BREAKFAST.  . [DISCONTINUED] nitroGLYCERIN (NITROSTAT) 0.4 MG SL  tablet Place 1 tablet (0.4 mg total) under the tongue every 5 (five) minutes as needed for up to 25 days for chest pain.     Allergies  Allergen Reactions  . Penicillins Other (See Comments)    Unsure, childhood reaction Has patient had a PCN reaction causing immediate rash,  facial/tongue/throat swelling, SOB or lightheadedness with hypotension: Unknown Has patient had a PCN reaction causing severe rash involving mucus membranes or skin necrosis: Unknown Has patient had a PCN reaction that required hospitalization: No Has patient had a PCN reaction occurring within the last 10 years: No If all of the above answers are "NO", then may proceed with Cephalosporin use.     Social History   Socioeconomic History  . Marital status: Married    Spouse name: Not on file  . Number of children: 2  . Years of education: Not on file  . Highest education level: Not on file  Occupational History  . Occupation: Truck Education administrator: Chief Financial Officer  Tobacco Use  . Smoking status: Former Smoker    Packs/day: 1.50    Years: 25.00    Pack years: 37.50    Types: Cigarettes    Quit date: 08/12/1996    Years since quitting: 23.0  . Smokeless tobacco: Never Used  Substance and Sexual Activity  . Alcohol use: Yes    Alcohol/week: 1.0 standard drinks    Types: 1 Cans of beer per week    Comment: social use  . Drug use: No  . Sexual activity: Yes  Other Topics Concern  . Not on file  Social History Narrative   ** Merged History Encounter **       Social Determinants of Health   Financial Resource Strain:   . Difficulty of Paying Living Expenses: Not on file  Food Insecurity:   . Worried About Charity fundraiser in the Last Year: Not on file  . Ran Out of Food in the Last Year: Not on file  Transportation Needs:   . Lack of Transportation (Medical): Not on file  . Lack of Transportation (Non-Medical): Not on file  Physical Activity:   . Days of Exercise per Week: Not on file  . Minutes of Exercise per Session: Not on file  Stress:   . Feeling of Stress : Not on file  Social Connections:   . Frequency of Communication with Friends and Family: Not on file  . Frequency of Social Gatherings with Friends and Family: Not on file  . Attends  Religious Services: Not on file  . Active Member of Clubs or Organizations: Not on file  . Attends Archivist Meetings: Not on file  . Marital Status: Not on file  Intimate Partner Violence:   . Fear of Current or Ex-Partner: Not on file  . Emotionally Abused: Not on file  . Physically Abused: Not on file  . Sexually Abused: Not on file     Review of Systems: General: negative for chills, fever, night sweats or weight changes.  Cardiovascular: negative for chest pain, dyspnea on exertion, edema, orthopnea, palpitations, paroxysmal nocturnal dyspnea or shortness of breath Dermatological: negative for rash Respiratory: negative for cough or wheezing Urologic: negative for hematuria Abdominal: negative for nausea, vomiting, diarrhea, bright red blood per rectum, melena, or hematemesis Neurologic: negative for visual changes, syncope, or dizziness All other systems reviewed and are otherwise negative except as noted above.    Blood pressure (!) 146/80, pulse (!) 54,  temperature (!) 96 F (35.6 C), height 5' 10"  (1.778 m), weight 230 lb (104.3 kg), SpO2 97 %.  General appearance: alert and no distress Neck: no adenopathy, no JVD, supple, symmetrical, trachea midline, thyroid not enlarged, symmetric, no tenderness/mass/nodules and Soft left carotid bruit Lungs: clear to auscultation bilaterally Heart: regular rate and rhythm, S1, S2 normal, no murmur, click, rub or gallop Extremities: extremities normal, atraumatic, no cyanosis or edema Pulses: 2+ and symmetric Skin: Skin color, texture, turgor normal. No rashes or lesions Neurologic: Alert and oriented X 3, normal strength and tone. Normal symmetric reflexes. Normal coordination and gait  EKG not performed today  ASSESSMENT AND PLAN:   Essential hypertension History of essential hypertension blood pressure measured today 146/80.  He is on amlodipine, carvedilol, lisinopril and hydrochlorothiazide.  Peripheral arterial  disease ([place]) History of peripheral arterial disease status post diamondback orbital rotational atherectomy, drug coated balloon angioplasty of the distal left SFA for high-grade subocclusive calcific, exophytic plaque 06/29/2015.  He did have high-grade calcific/exophytic subocclusive right popliteal artery plaque which he underwent diamondback orbital rotational atherectomy, drug-coated balloon angioplasty 07/13/2015.  I angiogrammed him 04/27/2018 revealing a 95% calcified right popliteal artery stenosis performed diamondback atherectomy, PTA and stenting using a Tigris 7 mm x 4040 mm long self-expanding stent with excellent result.  His Dopplers normalized and his claudication resolved.  His most recent Dopplers performed 11/24/2018 revealed a right ABI of 1.24 and a left which was the same with widely patent stents on the right.  He denies claudication.  Dyslipidemia, goal LDL below 70 History of dyslipidemia on statin therapy with lipid profile performed 05/03/2019 revealing total cholesterol of 124, LDL 63 and HDL 35.  Bicuspid aortic valve History of bicuspid aortic valve without stenosis and normal LV function by echo performed 04/20/2019.  We will recheck this in 12 months.  Aortic aneurysm, thoracic (HCC) History of thoracic aortic dilatation of 4.3 cm.  We will recheck this in 12 months.      Lorretta Harp MD FACP,FACC,FAHA, [facility] 08/20/2019 4:31 PM

## 2019-09-15 ENCOUNTER — Other Ambulatory Visit: Payer: Self-pay | Admitting: Family Medicine

## 2019-09-23 ENCOUNTER — Other Ambulatory Visit: Payer: Self-pay | Admitting: Family Medicine

## 2019-10-11 ENCOUNTER — Other Ambulatory Visit: Payer: Self-pay | Admitting: Family Medicine

## 2019-10-18 ENCOUNTER — Other Ambulatory Visit: Payer: Self-pay | Admitting: Cardiovascular Disease

## 2019-10-20 ENCOUNTER — Other Ambulatory Visit: Payer: Self-pay | Admitting: Cardiovascular Disease

## 2019-11-08 ENCOUNTER — Other Ambulatory Visit: Payer: Self-pay | Admitting: Cardiovascular Disease

## 2019-11-08 DIAGNOSIS — R0989 Other specified symptoms and signs involving the circulatory and respiratory systems: Secondary | ICD-10-CM

## 2019-11-08 DIAGNOSIS — I739 Peripheral vascular disease, unspecified: Secondary | ICD-10-CM

## 2019-11-15 ENCOUNTER — Ambulatory Visit (HOSPITAL_COMMUNITY)
Admission: RE | Admit: 2019-11-15 | Discharge: 2019-11-15 | Disposition: A | Discharge status: Home / Self Care | Payer: 59 | Source: Ambulatory Visit | Attending: Internal Medicine | Admitting: Internal Medicine

## 2019-11-15 ENCOUNTER — Other Ambulatory Visit: Payer: Self-pay

## 2019-11-15 ENCOUNTER — Ambulatory Visit (HOSPITAL_BASED_OUTPATIENT_CLINIC_OR_DEPARTMENT_OTHER)
Admission: RE | Admit: 2019-11-15 | Discharge: 2019-11-15 | Disposition: A | Discharge status: Home / Self Care | Payer: 59 | Source: Ambulatory Visit | Attending: Cardiovascular Disease | Admitting: Cardiovascular Disease

## 2019-11-15 ENCOUNTER — Other Ambulatory Visit: Payer: Self-pay | Admitting: Cardiovascular Disease

## 2019-11-15 DIAGNOSIS — I77810 Thoracic aortic ectasia: Secondary | ICD-10-CM | POA: Insufficient documentation

## 2019-11-15 DIAGNOSIS — Q231 Congenital insufficiency of aortic valve: Secondary | ICD-10-CM | POA: Insufficient documentation

## 2019-11-15 DIAGNOSIS — I10 Essential (primary) hypertension: Secondary | ICD-10-CM

## 2019-11-15 DIAGNOSIS — I739 Peripheral vascular disease, unspecified: Secondary | ICD-10-CM

## 2019-11-15 DIAGNOSIS — R0989 Other specified symptoms and signs involving the circulatory and respiratory systems: Secondary | ICD-10-CM | POA: Diagnosis not present

## 2019-11-15 DIAGNOSIS — Z9862 Peripheral vascular angioplasty status: Secondary | ICD-10-CM

## 2019-11-22 ENCOUNTER — Encounter: Payer: Self-pay | Admitting: Family Medicine

## 2019-11-22 ENCOUNTER — Ambulatory Visit (INDEPENDENT_AMBULATORY_CARE_PROVIDER_SITE_OTHER): Payer: Self-pay | Admitting: Family Medicine

## 2019-11-22 ENCOUNTER — Other Ambulatory Visit: Payer: Self-pay

## 2019-11-22 VITALS — BP 128/60 | HR 56 | Temp 96.9°F | Resp 16 | Ht 70.0 in | Wt 221.0 lb

## 2019-11-22 DIAGNOSIS — I10 Essential (primary) hypertension: Secondary | ICD-10-CM

## 2019-11-22 DIAGNOSIS — E1169 Type 2 diabetes mellitus with other specified complication: Secondary | ICD-10-CM

## 2019-11-22 DIAGNOSIS — Z125 Encounter for screening for malignant neoplasm of prostate: Secondary | ICD-10-CM

## 2019-11-22 DIAGNOSIS — N183 Chronic kidney disease, stage 3 unspecified: Secondary | ICD-10-CM

## 2019-11-22 NOTE — Progress Notes (Signed)
Subjective:    Patient ID: Matthew Chen, male    DOB: Jun 15, 1956, 64 y.o.   MRN: [id number]  Medication Refill  Patient is here today for follow-up of his chronic medical problems.  Patient has stage III chronic kidney disease, type 2 diabetes mellitus with a history of peripheral vascular disease requiring a stent in his right leg for which he is on an aspirin, essential hypertension, and dyslipidemia with hypertriglyceridemia.  Patient states that he is walking almost on a daily basis.  He denies any claudication with ambulation.  He denies any angina.  He denies any dyspnea.  Blood pressure today is well controlled at 128/60.  He is due for prostate cancer screening with a PSA.  He is also due to monitor his diabetes with hemoglobin A1c.  He is yet to have his Covid vaccination.  We spent approximately 7 minutes today discussing the risk and benefit of Covid vaccination.  I strongly encouraged.  Patient is still on the fence whether he would like to give it or not.  He denies any neuropathy in his feet.  Diabetic foot exam was performed today and is normal with excellent pulses in his dorsalis pedis and posterior tibialis. Past Medical History:  Diagnosis Date  . Adenomatous colon polyp 03/2008  . Aortic aneurysm, thoracic ([place]) 09/09/2016   4.4 cm by echo 05/2015  . Arthritis    "joints in my fingers ache" (07/13/2015)  . Bicuspid aortic valve 09/09/2016  . Cataract 2019   bilateral eyes  . CKD (chronic kidney disease), stage III   . Clotting disorder ([place])    on plavix for legs  . Diabetes mellitus without complication ([place])   . Hemorrhoids   . Hyperlipidemia   . Hypertension   . Peripheral arterial disease ([place])    a. dopppler 05/29/2015 revealed high-grade right popliteal and distal left SFA disease b. LE angio 06/28/2015 revealing high grade calcific dx with distal L SFA and R popliteal artery s/p diamondback orbital rotational atherectomy, PTA of L SFA  c. 07/13/2015 diamondback orbital  rotational atherectomy and drug eluting balloon angioplasty of R popliteal artery (P2 segment) using distal protection   . Type II diabetes mellitus ([place])   . Ulcerative colitis    Past Surgical History:  Procedure Laterality Date  . ABDOMINAL AORTOGRAM W/LOWER EXTREMITY N/A 04/27/2018   Procedure: ABDOMINAL AORTOGRAM W/LOWER EXTREMITY;  Surgeon: Lorretta Harp, MD;  Location: [facility];  Service: Cardiovascular;  Laterality: N/A;  . COLONOSCOPY W/ POLYPECTOMY  X 4-5  . COSMETIC SURGERY  < 2000   "removed birthmark from top of my head"  . PERIPHERAL VASCULAR ATHERECTOMY  04/27/2018   Procedure: PERIPHERAL VASCULAR ATHERECTOMY;  Surgeon: Lorretta Harp, MD;  Location: [facility];  Service: Cardiovascular;;  right popliteal artery  . PERIPHERAL VASCULAR CATHETERIZATION N/A 06/29/2015   Procedure: Lower Extremity Angiography;  Surgeon: Lorretta Harp, MD;  Location: [facility];  Service: Cardiovascular;  Laterality: N/A;  . PERIPHERAL VASCULAR CATHETERIZATION N/A 07/13/2015   Procedure: Lower Extremity Angiography;  Surgeon: Lorretta Harp, MD;  Location: [facility];  Service: Cardiovascular;  Laterality: N/A;  . PERIPHERAL VASCULAR CATHETERIZATION  07/13/2015   Procedure: Peripheral Vascular Atherectomy;  Surgeon: Lorretta Harp, MD;  Location: [facility];  Service: Cardiovascular;;  . PERIPHERAL VASCULAR INTERVENTION  04/27/2018   Procedure: PERIPHERAL VASCULAR INTERVENTION;  Surgeon: Lorretta Harp, MD;  Location: [facility];  Service:  Cardiovascular;;  right popliteal artery  . TONSILLECTOMY     Current Outpatient Medications on File Prior to Visit  Medication Sig Dispense Refill  . amLODipine (NORVASC) 10 MG tablet TAKE 1 TABLET BY MOUTH EVERY DAY 90 tablet 3  . aspirin EC 81 MG tablet Take 1 tablet (81 mg total) by mouth daily. 90 tablet 3  . atorvastatin (LIPITOR) 40 MG tablet TAKE 1 TABLET BY MOUTH EVERY DAY 90 tablet 3  .  blood glucose meter kit and supplies Dispense based on patient and insurance preference. Check BS daily. 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 Needs strips and lancets #100 /5 refills 1 each 0  . carvedilol (COREG) 25 MG tablet TAKE 1 TABLET BY MOUTH TWICE A DAY 180 tablet 2  . fenofibrate 160 MG tablet TAKE 1 TABLET BY MOUTH EVERY DAY 90 tablet 1  . glucose blood (ONE TOUCH ULTRA TEST) test strip Check BS bid 200 each 3  . Insulin Pen Needle (B-D UF III MINI PEN NEEDLES) 31G X 5 MM MISC USE DAILY WITH VICTOZA 100 each 0  . lisinopril-hydrochlorothiazide (ZESTORETIC) 20-25 MG tablet TAKE 1 TABLET BY MOUTH EVERY DAY 90 tablet 2  . metFORMIN (GLUCOPHAGE) 1000 MG tablet TAKE 1 TABLET BY MOUTH TWICE A DAY WITH MEALS 180 tablet 1  . pioglitazone (ACTOS) 30 MG tablet Take 1 tablet (30 mg total) by mouth daily. Stop Tradjenta 90 tablet 3  . VICTOZA 18 MG/3ML SOPN INJECT 1.2 MG UNDER THE SKIN ONCE DAILY 15 mL 3   No current facility-administered medications on file prior to visit.   Allergies  Allergen Reactions  . Penicillins Other (See Comments)    Unsure, childhood reaction Has patient had a PCN reaction causing immediate rash, facial/tongue/throat swelling, SOB or lightheadedness with hypotension: Unknown Has patient had a PCN reaction causing severe rash involving mucus membranes or skin necrosis: Unknown Has patient had a PCN reaction that required hospitalization: No Has patient had a PCN reaction occurring within the last 10 years: No If all of the above answers are "NO", then may proceed with Cephalosporin use.    Social History   Socioeconomic History  . Marital status: Married    Spouse name: Not on file  . Number of children: 2  . Years of education: Not on file  . Highest education level: Not on file  Occupational History  . Occupation: Truck Education administrator: Chief Financial Officer  Tobacco Use  . Smoking status: Former Smoker    Packs/day: 1.50    Years: 25.00    Pack years: 37.50     Types: Cigarettes    Quit date: 08/12/1996    Years since quitting: 23.2  . Smokeless tobacco: Never Used  Substance and Sexual Activity  . Alcohol use: Yes    Alcohol/week: 1.0 standard drinks    Types: 1 Cans of beer per week    Comment: social use  . Drug use: No  . Sexual activity: Yes  Other Topics Concern  . Not on file  Social History Narrative   ** Merged History Encounter **       Social Determinants of Health   Financial Resource Strain:   . Difficulty of Paying Living Expenses:   Food Insecurity:   . Worried About Charity fundraiser in the Last Year:   . Arboriculturist in the Last Year:   Transportation Needs:   . Film/video editor (Medical):   Marland Kitchen Lack of Transportation (Non-Medical):  Physical Activity:   . Days of Exercise per Week:   . Minutes of Exercise per Session:   Stress:   . Feeling of Stress :   Social Connections:   . Frequency of Communication with Friends and Family:   . Frequency of Social Gatherings with Friends and Family:   . Attends Religious Services:   . Active Member of Clubs or Organizations:   . Attends Archivist Meetings:   Marland Kitchen Marital Status:   Intimate Partner Violence:   . Fear of Current or Ex-Partner:   . Emotionally Abused:   Marland Kitchen Physically Abused:   . Sexually Abused:       Review of Systems  All other systems reviewed and are negative.      Objective:   Physical Exam  Constitutional: He appears well-developed and well-nourished.  Neck: No JVD present. No thyromegaly present.  Cardiovascular: Normal rate, regular rhythm and intact distal pulses.  Murmur heard. Pulmonary/Chest: Effort normal and breath sounds normal. No respiratory distress. He has no wheezes. He has no rales.  Abdominal: Soft. Bowel sounds are normal. He exhibits no distension. There is no abdominal tenderness. There is no rebound and no guarding.  Musculoskeletal:        General: No edema.  Skin: Skin is warm. No rash noted.    Vitals reviewed.         Assessment & Plan:   The primary encounter diagnosis was Benign essential HTN. Diagnoses of Type 2 diabetes mellitus with other specified complication, unspecified whether long term insulin use ([place]), Stage 3 chronic kidney disease, unspecified whether stage 3a or 3b CKD, and Prostate cancer screening were also pertinent to this visit. I will repeat a CBC, CMP, fasting lipid panel, and hemoglobin A1c.  Given his history of peripheral vascular disease, I would like his hemoglobin A1c to be less than 7 and ideally less than 6.5.  Diabetic foot exam was performed today and is normal. Goal LDL is less than 70.  Strongly encourage covid vacination

## 2019-11-23 LAB — COMPLETE METABOLIC PANEL WITH GFR
AG Ratio: 1.6 (calc) (ref 1.0–2.5)
ALT: 26 U/L (ref 9–46)
AST: 23 U/L (ref 10–35)
Albumin: 4.2 g/dL (ref 3.6–5.1)
Alkaline phosphatase (APISO): 47 U/L (ref 35–144)
BUN/Creatinine Ratio: 16 (calc) (ref 6–22)
BUN: 24 mg/dL (ref 7–25)
CO2: 28 mmol/L (ref 20–32)
Calcium: 9.6 mg/dL (ref 8.6–10.3)
Chloride: 105 mmol/L (ref 98–110)
Creat: 1.51 mg/dL — ABNORMAL HIGH (ref 0.70–1.25)
GFR, Est African American: 56 mL/min/{1.73_m2} — ABNORMAL LOW (ref >=60)
GFR, Est Non African American: 48 mL/min/{1.73_m2} — ABNORMAL LOW (ref >=60)
Globulin: 2.6 g/dL (calc) (ref 1.9–3.7)
Glucose, Bld: 114 mg/dL — ABNORMAL HIGH (ref 65–99)
Potassium: 4.3 mmol/L (ref 3.5–5.3)
Sodium: 140 mmol/L (ref 135–146)
Total Bilirubin: 0.5 mg/dL (ref 0.2–1.2)
Total Protein: 6.8 g/dL (ref 6.1–8.1)

## 2019-11-23 LAB — CBC WITH DIFFERENTIAL/PLATELET
Absolute Monocytes: 572 cells/uL (ref 200–950)
Basophils Absolute: 52 cells/uL (ref 0–200)
Basophils Relative: 1.2 %
Eosinophils Absolute: 181 cells/uL (ref 15–500)
Eosinophils Relative: 4.2 %
HCT: 38.5 % (ref 38.5–50.0)
Hemoglobin: 13 g/dL — ABNORMAL LOW (ref 13.2–17.1)
Lymphs Abs: 1445 cells/uL (ref 850–3900)
MCH: 31.2 pg (ref 27.0–33.0)
MCHC: 33.8 g/dL (ref 32.0–36.0)
MCV: 92.3 fL (ref 80.0–100.0)
MPV: 11.2 fL (ref 7.5–12.5)
Monocytes Relative: 13.3 %
Neutro Abs: 2051 cells/uL (ref 1500–7800)
Neutrophils Relative %: 47.7 %
Platelets: 222 10*3/uL (ref 140–400)
RBC: 4.17 10*6/uL — ABNORMAL LOW (ref 4.20–5.80)
RDW: 13.4 % (ref 11.0–15.0)
Total Lymphocyte: 33.6 %
WBC: 4.3 10*3/uL (ref 3.8–10.8)

## 2019-11-23 LAB — MICROALBUMIN, URINE: Microalb, Ur: <0.2 mg/dL

## 2019-11-23 LAB — PSA: PSA: 1 ng/mL (ref <=4.0)

## 2019-11-23 LAB — LIPID PANEL
Cholesterol: 128 mg/dL (ref <200)
HDL: 33 mg/dL — ABNORMAL LOW (ref >=40)
LDL Cholesterol (Calc): 67 mg/dL (calc)
Non-HDL Cholesterol (Calc): 95 mg/dL (calc) (ref <130)
Total CHOL/HDL Ratio: 3.9 (calc) (ref <5.0)
Triglycerides: 201 mg/dL — ABNORMAL HIGH (ref <150)

## 2019-11-23 LAB — HEMOGLOBIN A1C
Hgb A1c MFr Bld: 6.5 % of total Hgb — ABNORMAL HIGH (ref <5.7)
Mean Plasma Glucose: 140 (calc)
eAG (mmol/L): 7.7 (calc)

## 2019-11-25 ENCOUNTER — Other Ambulatory Visit: Payer: Self-pay | Admitting: Cardiovascular Disease

## 2019-11-26 ENCOUNTER — Other Ambulatory Visit: Payer: Self-pay | Admitting: Cardiovascular Disease

## 2019-11-26 MED ORDER — AMLODIPINE BESYLATE 10 MG PO TABS: 10.0000 mg | ORAL_TABLET | Freq: Every day | ORAL | 3 refills | Status: DC

## 2019-11-26 NOTE — Telephone Encounter (Signed)
*  STAT* If patient is at the pharmacy, call can be transferred to refill team.   1. Which medications need to be refilled? (please list name of each medication and dose if known) amLODipine (NORVASC) 10 MG tablet  2. Which pharmacy/location (including street and city if local pharmacy) is medication to be sent to? [id number] - [address]. Do they need a 30 day or 90 day supply? [address] [place]

## 2019-12-02 ENCOUNTER — Other Ambulatory Visit: Payer: Self-pay | Admitting: Family Medicine

## 2019-12-02 MED ORDER — FENOFIBRATE 160 MG PO TABS: 160.0000 mg | ORAL_TABLET | Freq: Every day | ORAL | 1 refills | Status: DC

## 2020-01-17 ENCOUNTER — Other Ambulatory Visit: Payer: Self-pay | Admitting: Family Medicine

## 2020-01-27 ENCOUNTER — Other Ambulatory Visit: Payer: Self-pay | Admitting: Family Medicine

## 2020-02-09 ENCOUNTER — Other Ambulatory Visit: Payer: Self-pay | Admitting: Family Medicine

## 2020-04-24 ENCOUNTER — Other Ambulatory Visit (HOSPITAL_COMMUNITY): Payer: 59

## 2020-04-25 ENCOUNTER — Other Ambulatory Visit: Payer: Self-pay

## 2020-04-25 ENCOUNTER — Ambulatory Visit (INDEPENDENT_AMBULATORY_CARE_PROVIDER_SITE_OTHER): Payer: 59 | Admitting: Family Medicine

## 2020-04-25 ENCOUNTER — Encounter: Payer: Self-pay | Admitting: Family Medicine

## 2020-04-25 VITALS — BP 118/58 | HR 65 | Resp 16 | Ht 70.0 in | Wt 220.0 lb

## 2020-04-25 DIAGNOSIS — E1169 Type 2 diabetes mellitus with other specified complication: Secondary | ICD-10-CM

## 2020-04-25 DIAGNOSIS — N183 Chronic kidney disease, stage 3 unspecified: Secondary | ICD-10-CM | POA: Diagnosis not present

## 2020-04-25 DIAGNOSIS — E785 Hyperlipidemia, unspecified: Secondary | ICD-10-CM | POA: Diagnosis not present

## 2020-04-25 DIAGNOSIS — I10 Essential (primary) hypertension: Secondary | ICD-10-CM | POA: Diagnosis not present

## 2020-04-25 MED ORDER — FLUCONAZOLE 150 MG PO TABS
150.0000 mg | ORAL_TABLET | Freq: Once | ORAL | 0 refills | Status: AC
Start: 2020-04-25 — End: 2020-04-25

## 2020-04-25 NOTE — Progress Notes (Signed)
Subjective:    Patient ID: Matthew Chen, male    DOB: Aug 15, 1955, 64 y.o.   MRN: [id number]  Medication Refill  Patient is here today for follow-up of his chronic medical problems.  Patient has stage III chronic kidney disease, type 2 diabetes mellitus with a history of peripheral vascular disease requiring a stent in his right leg for which he is on an aspirin, essential hypertension, and dyslipidemia with hypertriglyceridemia.  Patient states that he is walking almost on a daily basis.  He denies any claudication with ambulation.  He denies any angina.  He denies any dyspnea.  Patient's blood pressure today is well controlled at 118/58.  He has had his Covid shot.  He would like to defer his flu shot today as he is about to receive an infusion this afternoon.  He denies any neuropathy in his feet.  Diabetic foot exam was performed today and is normal except for the patient does have tenderness to palpation over the left plantar aspect of his calcaneus consistent with plantar fasciitis. Past Medical History:  Diagnosis Date  . Adenomatous colon polyp 03/2008  . Aortic aneurysm, thoracic ([place]) 09/09/2016   4.4 cm by echo 05/2015  . Arthritis    "joints in my fingers ache" (07/13/2015)  . Bicuspid aortic valve 09/09/2016  . Cataract 2019   bilateral eyes  . CKD (chronic kidney disease), stage III   . Clotting disorder ([place])    on plavix for legs  . Diabetes mellitus without complication ([place])   . Hemorrhoids   . Hyperlipidemia   . Hypertension   . Peripheral arterial disease ([place])    a. dopppler 05/29/2015 revealed high-grade right popliteal and distal left SFA disease b. LE angio 06/28/2015 revealing high grade calcific dx with distal L SFA and R popliteal artery s/p diamondback orbital rotational atherectomy, PTA of L SFA  c. 07/13/2015 diamondback orbital rotational atherectomy and drug eluting balloon angioplasty of R popliteal artery (P2 segment) using distal protection   . Type II diabetes  mellitus ([place])   . Ulcerative colitis    Past Surgical History:  Procedure Laterality Date  . ABDOMINAL AORTOGRAM W/LOWER EXTREMITY N/A 04/27/2018   Procedure: ABDOMINAL AORTOGRAM W/LOWER EXTREMITY;  Surgeon: Lorretta Harp, MD;  Location: [facility];  Service: Cardiovascular;  Laterality: N/A;  . COLONOSCOPY W/ POLYPECTOMY  X 4-5  . COSMETIC SURGERY  < 2000   "removed birthmark from top of my head"  . PERIPHERAL VASCULAR ATHERECTOMY  04/27/2018   Procedure: PERIPHERAL VASCULAR ATHERECTOMY;  Surgeon: Lorretta Harp, MD;  Location: [facility];  Service: Cardiovascular;;  right popliteal artery  . PERIPHERAL VASCULAR CATHETERIZATION N/A 06/29/2015   Procedure: Lower Extremity Angiography;  Surgeon: Lorretta Harp, MD;  Location: [facility];  Service: Cardiovascular;  Laterality: N/A;  . PERIPHERAL VASCULAR CATHETERIZATION N/A 07/13/2015   Procedure: Lower Extremity Angiography;  Surgeon: Lorretta Harp, MD;  Location: [facility];  Service: Cardiovascular;  Laterality: N/A;  . PERIPHERAL VASCULAR CATHETERIZATION  07/13/2015   Procedure: Peripheral Vascular Atherectomy;  Surgeon: Lorretta Harp, MD;  Location: [facility];  Service: Cardiovascular;;  . PERIPHERAL VASCULAR INTERVENTION  04/27/2018   Procedure: PERIPHERAL VASCULAR INTERVENTION;  Surgeon: Lorretta Harp, MD;  Location: [facility];  Service: Cardiovascular;;  right popliteal artery  . TONSILLECTOMY     Current Outpatient Medications on File Prior to Visit  Medication Sig Dispense Refill  . amLODipine (  NORVASC) 10 MG tablet Take 1 tablet (10 mg total) by mouth daily. 90 tablet 3  . aspirin EC 81 MG tablet Take 1 tablet (81 mg total) by mouth daily. 90 tablet 3  . atorvastatin (LIPITOR) 40 MG tablet TAKE 1 TABLET BY MOUTH EVERY DAY 90 tablet 3  . blood glucose meter kit and supplies Dispense based on patient and insurance preference. Check BS daily. 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 Needs  strips and lancets #100 /5 refills 1 each 0  . carvedilol (COREG) 25 MG tablet TAKE 1 TABLET BY MOUTH TWICE A DAY 180 tablet 2  . fenofibrate 160 MG tablet Take 1 tablet (160 mg total) by mouth daily. 90 tablet 1  . Insulin Pen Needle (B-D UF III MINI PEN NEEDLES) 31G X 5 MM MISC USE DAILY WITH VICTOZA 100 each 0  . lisinopril-hydrochlorothiazide (ZESTORETIC) 20-25 MG tablet TAKE 1 TABLET BY MOUTH EVERY DAY 90 tablet 2  . metFORMIN (GLUCOPHAGE) 1000 MG tablet TAKE 1 TABLET BY MOUTH TWICE A DAY WITH MEALS 180 tablet 1  . ONETOUCH ULTRA test strip CHECK BLOOD SUGAR TWICE A DAY 50 strip 15  . pioglitazone (ACTOS) 30 MG tablet TAKE 1 TABLET BY MOUTH EVERY DAY STOP TRADJENTA 90 tablet 2  . VICTOZA 18 MG/3ML SOPN INJECT 1.2 MG UNDER THE SKIN ONCE DAILY 15 mL 3   No current facility-administered medications on file prior to visit.   Allergies  Allergen Reactions  . Penicillins Other (See Comments)    Unsure, childhood reaction Has patient had a PCN reaction causing immediate rash, facial/tongue/throat swelling, SOB or lightheadedness with hypotension: Unknown Has patient had a PCN reaction causing severe rash involving mucus membranes or skin necrosis: Unknown Has patient had a PCN reaction that required hospitalization: No Has patient had a PCN reaction occurring within the last 10 years: No If all of the above answers are "NO", then may proceed with Cephalosporin use.    Social History   Socioeconomic History  . Marital status: Married    Spouse name: Not on file  . Number of children: 2  . Years of education: Not on file  . Highest education level: Not on file  Occupational History  . Occupation: Truck Education administrator: Chief Financial Officer  Tobacco Use  . Smoking status: Former Smoker    Packs/day: 1.50    Years: 25.00    Pack years: 37.50    Types: Cigarettes    Quit date: 08/12/1996    Years since quitting: 23.7  . Smokeless tobacco: Never Used  Vaping Use  . Vaping  Use: Never used  Substance and Sexual Activity  . Alcohol use: Yes    Alcohol/week: 1.0 standard drink    Types: 1 Cans of beer per week    Comment: social use  . Drug use: No  . Sexual activity: Yes  Other Topics Concern  . Not on file  Social History Narrative   ** Merged History Encounter **       Social Determinants of Health   Financial Resource Strain:   . Difficulty of Paying Living Expenses: Not on file  Food Insecurity:   . Worried About Charity fundraiser in the Last Year: Not on file  . Ran Out of Food in the Last Year: Not on file  Transportation Needs:   . Lack of Transportation (Medical): Not on file  . Lack of Transportation (Non-Medical): Not on file  Physical Activity:   . Days  of Exercise per Week: Not on file  . Minutes of Exercise per Session: Not on file  Stress:   . Feeling of Stress : Not on file  Social Connections:   . Frequency of Communication with Friends and Family: Not on file  . Frequency of Social Gatherings with Friends and Family: Not on file  . Attends Religious Services: Not on file  . Active Member of Clubs or Organizations: Not on file  . Attends Archivist Meetings: Not on file  . Marital Status: Not on file  Intimate Partner Violence:   . Fear of Current or Ex-Partner: Not on file  . Emotionally Abused: Not on file  . Physically Abused: Not on file  . Sexually Abused: Not on file      Review of Systems  All other systems reviewed and are negative.      Objective:   Physical Exam Vitals reviewed.  Constitutional:      Appearance: He is well-developed.  Neck:     Thyroid: No thyromegaly.     Vascular: No JVD.  Cardiovascular:     Rate and Rhythm: Normal rate and regular rhythm.     Heart sounds: Murmur heard.   Pulmonary:     Effort: Pulmonary effort is normal. No respiratory distress.     Breath sounds: Normal breath sounds. No wheezing or rales.  Abdominal:     General: Bowel sounds are normal. There  is no distension.     Palpations: Abdomen is soft.     Tenderness: There is no abdominal tenderness. There is no guarding or rebound.  Skin:    General: Skin is warm.     Findings: No rash.           Assessment & Plan:   Type 2 diabetes mellitus with other specified complication, unspecified whether long term insulin use (HCC) - Plan: CBC with Differential/Platelet, COMPLETE METABOLIC PANEL WITH GFR, Lipid panel, Microalbumin, urine, Hemoglobin A1c  Benign essential HTN  Stage 3 chronic kidney disease, unspecified whether stage 3a or 3b CKD  Dyslipidemia  Patient can return at anytime for the flu shot.  His blood pressure today is well controlled.  No changes need to be made in his antihypertensives.  I will check an A1c.  His last A1c was outstanding at 6.5 in April.  Goal is less than 7.  I will check a urine microalbumin as well.  Monitor his renal function.  Check a fasting lipid panel.  His goal LDL cholesterol is less than 70 given his history of peripheral vascular disease.  In April it was 61.  I congratulated the patient for receiving his Covid shot.  Also encouraged the flu shot.  We discussed a series of stretches that he could perform for plantar fasciitis.  Offer the patient a cortisone injection in his heel but he politely declined.  Okay thank you

## 2020-04-26 LAB — COMPLETE METABOLIC PANEL WITH GFR
AG Ratio: 1.7 (calc) (ref 1.0–2.5)
ALT: 28 U/L (ref 9–46)
AST: 24 U/L (ref 10–35)
Albumin: 4.3 g/dL (ref 3.6–5.1)
Alkaline phosphatase (APISO): 45 U/L (ref 35–144)
BUN/Creatinine Ratio: 13 (calc) (ref 6–22)
BUN: 21 mg/dL (ref 7–25)
CO2: 24 mmol/L (ref 20–32)
Calcium: 9.7 mg/dL (ref 8.6–10.3)
Chloride: 102 mmol/L (ref 98–110)
Creat: 1.64 mg/dL — ABNORMAL HIGH (ref 0.70–1.25)
GFR, Est African American: 50 mL/min/{1.73_m2} — ABNORMAL LOW (ref >=60)
GFR, Est Non African American: 44 mL/min/{1.73_m2} — ABNORMAL LOW (ref >=60)
Globulin: 2.6 g/dL (calc) (ref 1.9–3.7)
Glucose, Bld: 102 mg/dL — ABNORMAL HIGH (ref 65–99)
Potassium: 4.7 mmol/L (ref 3.5–5.3)
Sodium: 136 mmol/L (ref 135–146)
Total Bilirubin: 0.6 mg/dL (ref 0.2–1.2)
Total Protein: 6.9 g/dL (ref 6.1–8.1)

## 2020-04-26 LAB — CBC WITH DIFFERENTIAL/PLATELET
Absolute Monocytes: 653 cells/uL (ref 200–950)
Basophils Absolute: 49 cells/uL (ref 0–200)
Basophils Relative: 0.9 %
Eosinophils Absolute: 178 cells/uL (ref 15–500)
Eosinophils Relative: 3.3 %
HCT: 39.7 % (ref 38.5–50.0)
Hemoglobin: 13.2 g/dL (ref 13.2–17.1)
Lymphs Abs: 1604 cells/uL (ref 850–3900)
MCH: 30.5 pg (ref 27.0–33.0)
MCHC: 33.2 g/dL (ref 32.0–36.0)
MCV: 91.7 fL (ref 80.0–100.0)
MPV: 11.3 fL (ref 7.5–12.5)
Monocytes Relative: 12.1 %
Neutro Abs: 2916 cells/uL (ref 1500–7800)
Neutrophils Relative %: 54 %
Platelets: 292 10*3/uL (ref 140–400)
RBC: 4.33 10*6/uL (ref 4.20–5.80)
RDW: 13.8 % (ref 11.0–15.0)
Total Lymphocyte: 29.7 %
WBC: 5.4 10*3/uL (ref 3.8–10.8)

## 2020-04-26 LAB — HEMOGLOBIN A1C
Hgb A1c MFr Bld: 6.8 % of total Hgb — ABNORMAL HIGH (ref <5.7)
Mean Plasma Glucose: 148 (calc)
eAG (mmol/L): 8.2 (calc)

## 2020-04-26 LAB — MICROALBUMIN, URINE: Microalb, Ur: 0.2 mg/dL

## 2020-04-26 LAB — LIPID PANEL
Cholesterol: 140 mg/dL (ref <200)
HDL: 36 mg/dL — ABNORMAL LOW (ref >=40)
LDL Cholesterol (Calc): 76 mg/dL (calc)
Non-HDL Cholesterol (Calc): 104 mg/dL (calc) (ref <130)
Total CHOL/HDL Ratio: 3.9 (calc) (ref <5.0)
Triglycerides: 184 mg/dL — ABNORMAL HIGH (ref <150)

## 2020-05-08 ENCOUNTER — Other Ambulatory Visit (HOSPITAL_COMMUNITY): Payer: 59

## 2020-05-15 ENCOUNTER — Ambulatory Visit (HOSPITAL_COMMUNITY): Payer: 59 | Attending: Cardiology

## 2020-05-15 ENCOUNTER — Other Ambulatory Visit: Payer: Self-pay

## 2020-05-15 DIAGNOSIS — I10 Essential (primary) hypertension: Secondary | ICD-10-CM | POA: Diagnosis not present

## 2020-05-15 DIAGNOSIS — R0989 Other specified symptoms and signs involving the circulatory and respiratory systems: Secondary | ICD-10-CM | POA: Diagnosis not present

## 2020-05-15 DIAGNOSIS — Q231 Congenital insufficiency of aortic valve: Secondary | ICD-10-CM | POA: Insufficient documentation

## 2020-05-15 DIAGNOSIS — I739 Peripheral vascular disease, unspecified: Secondary | ICD-10-CM | POA: Insufficient documentation

## 2020-05-15 DIAGNOSIS — I77810 Thoracic aortic ectasia: Secondary | ICD-10-CM

## 2020-05-15 LAB — ECHOCARDIOGRAM COMPLETE
AR max vel: 2.98 cm2
AV Mean grad: 9.7 mmHg
AV Peak grad: 20.3 mmHg
Ao pk vel: 2.25 m/s
Area-P 1/2: 2.45 cm2
S' Lateral: 2.3 cm

## 2020-05-16 ENCOUNTER — Other Ambulatory Visit: Payer: Self-pay

## 2020-05-16 DIAGNOSIS — I77810 Thoracic aortic ectasia: Secondary | ICD-10-CM

## 2020-05-16 DIAGNOSIS — I712 Thoracic aortic aneurysm, without rupture, unspecified: Secondary | ICD-10-CM

## 2020-05-16 DIAGNOSIS — Q231 Congenital insufficiency of aortic valve: Secondary | ICD-10-CM

## 2020-05-16 NOTE — Progress Notes (Signed)
echo

## 2020-05-25 ENCOUNTER — Other Ambulatory Visit: Payer: Self-pay | Admitting: Family Medicine

## 2020-05-28 ENCOUNTER — Other Ambulatory Visit: Payer: Self-pay | Admitting: Cardiovascular Disease

## 2020-05-28 ENCOUNTER — Other Ambulatory Visit: Payer: Self-pay | Admitting: Family Medicine

## 2020-06-26 ENCOUNTER — Encounter: Payer: Self-pay | Admitting: Family Medicine

## 2020-06-26 LAB — HM DIABETES EYE EXAM

## 2020-07-12 ENCOUNTER — Telehealth: Payer: Self-pay

## 2020-07-12 NOTE — Telephone Encounter (Signed)
Labs put on your desk for review.

## 2020-07-12 NOTE — Telephone Encounter (Signed)
-----  Message from [name], MD sent at 07/12/2020 12:08 PM EST ----- This patient has UC on Entyvio at [facility].  Please obtain results of CMP, CBC, ESR, CRP from [facility] from November.  Please schedule patient for REV with [place]. It is over 12 months since his last appt.  REV with [place] at least every 12 months. January REV is fine.

## 2020-07-12 NOTE — Telephone Encounter (Signed)
Called and left message with the referral department at [facility] to get recent labs. Called patient and informed him that he is due for follow up with Dr. Fuller Plan. Patient scheduled appt for 08/28/20 at 2:30pm.

## 2020-07-17 ENCOUNTER — Other Ambulatory Visit: Payer: Self-pay | Admitting: Cardiovascular Disease

## 2020-08-28 ENCOUNTER — Other Ambulatory Visit: Payer: Self-pay

## 2020-08-28 ENCOUNTER — Telehealth: Payer: 59 | Admitting: Gastroenterology

## 2020-08-28 ENCOUNTER — Encounter: Payer: Self-pay | Admitting: Gastroenterology

## 2020-08-29 ENCOUNTER — Ambulatory Visit: Payer: 59 | Admitting: Cardiovascular Disease

## 2020-08-29 ENCOUNTER — Other Ambulatory Visit: Payer: Self-pay | Admitting: Family Medicine

## 2020-10-10 ENCOUNTER — Ambulatory Visit (INDEPENDENT_AMBULATORY_CARE_PROVIDER_SITE_OTHER): Payer: 59 | Admitting: Cardiovascular Disease

## 2020-10-10 ENCOUNTER — Encounter: Payer: Self-pay | Admitting: Cardiovascular Disease

## 2020-10-10 ENCOUNTER — Other Ambulatory Visit: Payer: Self-pay

## 2020-10-10 VITALS — BP 118/66 | HR 71 | Ht 70.0 in | Wt 229.0 lb

## 2020-10-10 DIAGNOSIS — I739 Peripheral vascular disease, unspecified: Secondary | ICD-10-CM | POA: Diagnosis not present

## 2020-10-10 DIAGNOSIS — I712 Thoracic aortic aneurysm, without rupture, unspecified: Secondary | ICD-10-CM

## 2020-10-10 DIAGNOSIS — I10 Essential (primary) hypertension: Secondary | ICD-10-CM | POA: Diagnosis not present

## 2020-10-10 DIAGNOSIS — E785 Hyperlipidemia, unspecified: Secondary | ICD-10-CM

## 2020-10-10 NOTE — Patient Instructions (Addendum)
Medication Instructions:  Your physician recommends that you continue on your current medications as directed. Please refer to the Current Medication list given to you today.  *If you need a refill on your cardiac medications before your next appointment, please call your pharmacy*   Testing/Procedures: Your physician has requested that you have a lower extremity arterial duplex. This test is an ultrasound of the arteries in the legs. It looks at arterial blood flow in the legs. Allow one hour for Lower Arterial scans. There are no restrictions or special instructions  Your physician has requested that you have an ankle brachial index (ABI). During this test an ultrasound and blood pressure cuff are used to evaluate the arteries that supply the arms and legs with blood. Allow thirty minutes for this exam. There are no restrictions or special instructions.  These procedures are done at [place]. 2nd Floor  -Due to be done in April 2022  Your physician has requested that you have an echocardiogram. Echocardiography is a painless test that uses sound waves to create images of your heart. It provides your doctor with information about the size and shape of your heart and how well your heart's chambers and valves are working. This procedure takes approximately one hour. There are no restrictions for this procedure. This procedure is done at [address] 3rd Floor. To be done Oct 2022.   Follow-Up: At [facility], you and your health needs are our priority.  As part of our continuing mission to provide you with exceptional heart care, we have created designated Provider Care Teams.  These Care Teams include your primary Cardiologist (physician) and Advanced Practice Providers (APPs -  Physician Assistants and Nurse Practitioners) who all work together to provide you with the care you need, when you need it.  We recommend signing up for the patient portal called "MyChart".  Sign up  information is provided on this After Visit Summary.  MyChart is used to connect with patients for Virtual Visits (Telemedicine).  Patients are able to view lab/test results, encounter notes, upcoming appointments, etc.  Non-urgent messages can be sent to your provider as well.   To learn more about what you can do with MyChart, go to [email or web address].    Your next appointment:   12 month(s)  The format for your next appointment:   In Person  Provider:   Quay Burow, MD

## 2020-10-10 NOTE — Assessment & Plan Note (Signed)
History of small ascending thoracic aortic aneurysm measuring 41 mm by 2D echo performed 05/15/2020 with a bicuspid aortic valve.  This will be followed on an annual basis.

## 2020-10-10 NOTE — Assessment & Plan Note (Signed)
History of essential hypertension blood pressure measured today 118/66.  He is on amlodipine, carvedilol, lisinopril and hydrochlorothiazide.

## 2020-10-10 NOTE — Assessment & Plan Note (Signed)
History of dyslipidemia on fenofibrate and atorvastatin with lipid profile performed 04/25/2020 revealing total pressure 140, LDL 76 and HDL of 36.

## 2020-10-10 NOTE — Assessment & Plan Note (Signed)
History of PAD status post left SFA intervention with diamondback orbital rotational arthrectomy, PTA 06/29/2015.  He did have a 95% above-the-knee right popliteal artery stenosis and three-vessel runoff bilaterally.  I came back the following month and performed right SFA intervention.  Because of recurrent claudication I reangiogrammed him 04/27/2018 and performed orbital atherectomy, PTA and stenting using a Tigris 7 mm x 40 mm long self-expanding stent.  He denies claudication.  His most recent Doppler studies performed 11/15/2019 revealed normal ABIs bilaterally with widely patent right popliteal artery stent.  We will continue to follow his Dopplers on an annual basis.

## 2020-10-10 NOTE — Progress Notes (Signed)
10/10/2020 Matthew Chen   Sep 15, 1955  [id number]  Primary Physician Pickard, Cammie Mcgee, MD Primary Cardiologist: Matthew Harp MD Matthew Chen, [place], [place]  HPI:  Matthew Chen is a 65 y.o.  mild to moderately overweight married Caucasian male father of 4 children, grandfather of a grandchildren was referred by Dr. Skeet Chen for evaluation and treatment of claudication. I last saw him in the office  08/20/2019.He has a history of treated hypertension, diabetes and hyperlipidemia. 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 He's never had a heart attack or stroke and denies chest pain or shortness of breath. He does complain of lifestyle limiting claudication and had arterial Doppler studies performed in our office 05/29/15 revealing high-grade right popliteal and distal left SFA disease. Based on this, we decided to proceed with angiography and potential percutaneous revascularization. I initially angiogrammed him on 06/29/15 and performed diamondback orbital rotational atherectomy, drug eluting balloon angioplasty of the distal left SFA for a high-grade, subocclusive calcific/exophytic plaque. He had a high-grade calcific/exophytic subocclusive right popliteal artery plaque for which he underwent diamondback orbital rotation arthrectomy, drug eluding balloon angioplasty on 07/13/15 with an excellent angiographic result. Since that time his claudication has resolved. His Dopplers performed8/6/2019revealed normal ABIs bilaterally with the development of a high-frequency signal in his right popliteal artery.Since I saw him 8 months ago he has developed lifestyle limiting right calf claudication.  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 He had 30 to 40% mid left SFA stenosis. I performed diamondback orbital rotational atherectomy, PTA and stenting using a Tigris 7 mm x  40 mm long self-expanding stent. His Dopplers normalized and his claudication has resolved.  Since I saw him a year ago he continues to do well.  He denies chest pain, shortness of breath or claudication.  His Dopplers performed 11/15/2019 revealed his right SFA stent to be widely patent.  Echo performed 05/15/2020 revealed a bicuspid aortic valve with mild aortic stenosis with normal LV function and a moderately dilated aortic root at 41 mm.    No outpatient medications have been marked as taking for the 10/10/20 encounter (Office Visit) with Matthew Harp, MD.     Allergies  Allergen Reactions  . Penicillins Other (See Comments)    Unsure, childhood reaction Has patient had a PCN reaction causing immediate rash, facial/tongue/throat swelling, SOB or lightheadedness with hypotension: Unknown Has patient had a PCN reaction causing severe rash involving mucus membranes or skin necrosis: Unknown Has patient had a PCN reaction that required hospitalization: No Has patient had a PCN reaction occurring within the last 10 years: No If all of the above answers are "NO", then may proceed with Cephalosporin use.     Social History   Socioeconomic History  . Marital status: Married    Spouse name: Not on file  . Number of children: 2  . Years of education: Not on file  . Highest education level: Not on file  Occupational History  . Occupation: Truck Education administrator: Chief Financial Officer  Tobacco Use  . Smoking status: Former Smoker    Packs/day: 1.50    Years: 25.00    Pack years: 37.50    Types: Cigarettes    Quit date: 08/12/1996    Years since quitting: 24.1  . Smokeless tobacco: Never Used  Vaping Use  . Vaping Use:  Never used  Substance and Sexual Activity  . Alcohol use: Yes    Alcohol/week: 1.0 standard drink    Types: 1 Cans of beer per week    Comment: social use  . Drug use: No  . Sexual activity: Yes  Other Topics Concern  . Not on file  Social History  Narrative   ** Merged History Encounter **       Social Determinants of Health   Financial Resource Strain: Not on file  Food Insecurity: Not on file  Transportation Needs: Not on file  Physical Activity: Not on file  Stress: Not on file  Social Connections: Not on file  Intimate Partner Violence: Not on file     Review of Systems: General: negative for chills, fever, night sweats or weight changes.  Cardiovascular: negative for chest pain, dyspnea on exertion, edema, orthopnea, palpitations, paroxysmal nocturnal dyspnea or shortness of breath Dermatological: negative for rash Respiratory: negative for cough or wheezing Urologic: negative for hematuria Abdominal: negative for nausea, vomiting, diarrhea, bright red blood per rectum, melena, or hematemesis Neurologic: negative for visual changes, syncope, or dizziness All other systems reviewed and are otherwise negative except as noted above.    Blood pressure 118/66, pulse 71, height 5' 10"  (1.778 m), weight 229 lb (103.9 kg), SpO2 98 %.  General appearance: alert and no distress Neck: no adenopathy, no carotid bruit, no JVD, supple, symmetrical, trachea midline and thyroid not enlarged, symmetric, no tenderness/mass/nodules Lungs: clear to auscultation bilaterally Heart: regular rate and rhythm, S1, S2 normal, no murmur, click, rub or gallop Extremities: extremities normal, atraumatic, no cyanosis or edema Pulses: 2+ and symmetric Skin: Skin color, texture, turgor normal. No rashes or lesions Neurologic: Alert and oriented X 3, normal strength and tone. Normal symmetric reflexes. Normal coordination and gait  EKG sinus rhythm at 71 without ST or T wave changes.  Personally reviewed this EKG.  ASSESSMENT AND PLAN:   Essential hypertension History of essential hypertension blood pressure measured today 118/66.  He is on amlodipine, carvedilol, lisinopril and hydrochlorothiazide.  Peripheral arterial disease ([place]) History  of PAD status post left SFA intervention with diamondback orbital rotational arthrectomy, PTA 06/29/2015.  He did have a 95% above-the-knee right popliteal artery stenosis and three-vessel runoff bilaterally.  I came back the following month and performed right SFA intervention.  Because of recurrent claudication I reangiogrammed him 04/27/2018 and performed orbital atherectomy, PTA and stenting using a Tigris 7 mm x 40 mm long self-expanding stent.  He denies claudication.  His most recent Doppler studies performed 11/15/2019 revealed normal ABIs bilaterally with widely patent right popliteal artery stent.  We will continue to follow his Dopplers on an annual basis.  Dyslipidemia, goal LDL below 70 History of dyslipidemia on fenofibrate and atorvastatin with lipid profile performed 04/25/2020 revealing total pressure 140, LDL 76 and HDL of 36.  Aortic aneurysm, thoracic (HCC) History of small ascending thoracic aortic aneurysm measuring 41 mm by 2D echo performed 05/15/2020 with a bicuspid aortic valve.  This will be followed on an annual basis.      Matthew Harp MD FACP,FACC,FAHA, [facility] 10/10/2020 3:49 PM

## 2020-10-28 ENCOUNTER — Other Ambulatory Visit: Payer: Self-pay | Admitting: Family Medicine

## 2020-11-13 ENCOUNTER — Other Ambulatory Visit (HOSPITAL_COMMUNITY): Payer: Self-pay | Admitting: Cardiovascular Disease

## 2020-11-13 ENCOUNTER — Ambulatory Visit (HOSPITAL_COMMUNITY)
Admission: RE | Admit: 2020-11-13 | Discharge: 2020-11-13 | Disposition: A | Discharge status: Home / Self Care | Payer: 59 | Source: Ambulatory Visit | Attending: Cardiology | Admitting: Cardiology

## 2020-11-13 ENCOUNTER — Other Ambulatory Visit: Payer: Self-pay

## 2020-11-13 DIAGNOSIS — Z9862 Peripheral vascular angioplasty status: Secondary | ICD-10-CM

## 2020-11-13 DIAGNOSIS — I739 Peripheral vascular disease, unspecified: Secondary | ICD-10-CM | POA: Insufficient documentation

## 2020-11-13 DIAGNOSIS — Z95820 Peripheral vascular angioplasty status with implants and grafts: Secondary | ICD-10-CM

## 2020-11-20 ENCOUNTER — Other Ambulatory Visit: Payer: Self-pay

## 2020-11-20 ENCOUNTER — Ambulatory Visit (INDEPENDENT_AMBULATORY_CARE_PROVIDER_SITE_OTHER): Payer: 59 | Admitting: Family Medicine

## 2020-11-20 ENCOUNTER — Encounter: Payer: Self-pay | Admitting: Family Medicine

## 2020-11-20 VITALS — BP 124/66 | HR 94 | Temp 97.9°F | Resp 14 | Ht 70.0 in | Wt 228.0 lb

## 2020-11-20 DIAGNOSIS — E1169 Type 2 diabetes mellitus with other specified complication: Secondary | ICD-10-CM | POA: Diagnosis not present

## 2020-11-20 DIAGNOSIS — I10 Essential (primary) hypertension: Secondary | ICD-10-CM | POA: Diagnosis not present

## 2020-11-20 DIAGNOSIS — Z125 Encounter for screening for malignant neoplasm of prostate: Secondary | ICD-10-CM | POA: Diagnosis not present

## 2020-11-20 DIAGNOSIS — E785 Hyperlipidemia, unspecified: Secondary | ICD-10-CM | POA: Diagnosis not present

## 2020-11-20 DIAGNOSIS — N1832 Chronic kidney disease, stage 3b: Secondary | ICD-10-CM

## 2020-11-20 MED ORDER — SILDENAFIL CITRATE 100 MG PO TABS: 50.0000 mg | ORAL_TABLET | Freq: Every day | ORAL | 11 refills | Status: DC | PRN

## 2020-11-20 NOTE — Progress Notes (Signed)
Subjective:    Patient ID: Matthew Chen, male    DOB: 30-May-1956, 65 y.o.   MRN: [id number]  Medication Refill  Patient is a very pleasant 65 year old Caucasian gentleman who is here today for medicine check.  His blood pressure today is outstanding at 124/66.  He denies any chest pain shortness of breath or dyspnea on exertion.  He has a history of type 2 diabetes.  He is due to recheck his A1c.  He also has a history of dyslipidemia.  He denies any polydipsia or blurry vision.  He denies any neuropathy in his feet.  He does have a history of peripheral vascular disease.  Diabetic foot exam was performed today and is normal.  He has normal sensation to 10 g monofilament and palpable dorsalis pedis pulses bilaterally with no evidence of ulcerations or skin abrasions.  However he does complain of polyuria.  He states that he is waking up 3 times every evening having to urinate.  He denies a weak stream.  He denies any hesitancy.  He denies any dysuria or hematuria.  He also reports increased frequency during the day.  He drives a truck for living and states that he has to stop every hour or so to go to the bathroom.  Symptoms sound more like overactive bladder than lower urinary tract symptoms. Past Medical History:  Diagnosis Date  . Adenomatous colon polyp 03/2008  . Aortic aneurysm, thoracic ([place]) 09/09/2016   4.4 cm by echo 05/2015  . Arthritis    "joints in my fingers ache" (07/13/2015)  . Bicuspid aortic valve 09/09/2016  . Cataract 2019   bilateral eyes  . CKD (chronic kidney disease), stage III ([place])   . Clotting disorder ([place])    on plavix for legs  . Diabetes mellitus without complication ([place])   . Hemorrhoids   . Hyperlipidemia   . Hypertension   . Peripheral arterial disease ([place])    a. dopppler 05/29/2015 revealed high-grade right popliteal and distal left SFA disease b. LE angio 06/28/2015 revealing high grade calcific dx with distal L SFA and R popliteal artery s/p diamondback  orbital rotational atherectomy, PTA of L SFA  c. 07/13/2015 diamondback orbital rotational atherectomy and drug eluting balloon angioplasty of R popliteal artery (P2 segment) using distal protection   . Type II diabetes mellitus ([place])   . Ulcerative colitis    Past Surgical History:  Procedure Laterality Date  . ABDOMINAL AORTOGRAM W/LOWER EXTREMITY N/A 04/27/2018   Procedure: ABDOMINAL AORTOGRAM W/LOWER EXTREMITY;  Surgeon: Lorretta Harp, MD;  Location: [facility];  Service: Cardiovascular;  Laterality: N/A;  . COLONOSCOPY W/ POLYPECTOMY  X 4-5  . COSMETIC SURGERY  < 2000   "removed birthmark from top of my head"  . PERIPHERAL VASCULAR ATHERECTOMY  04/27/2018   Procedure: PERIPHERAL VASCULAR ATHERECTOMY;  Surgeon: Lorretta Harp, MD;  Location: [facility];  Service: Cardiovascular;;  right popliteal artery  . PERIPHERAL VASCULAR CATHETERIZATION N/A 06/29/2015   Procedure: Lower Extremity Angiography;  Surgeon: Lorretta Harp, MD;  Location: [facility];  Service: Cardiovascular;  Laterality: N/A;  . PERIPHERAL VASCULAR CATHETERIZATION N/A 07/13/2015   Procedure: Lower Extremity Angiography;  Surgeon: Lorretta Harp, MD;  Location: [facility];  Service: Cardiovascular;  Laterality: N/A;  . PERIPHERAL VASCULAR CATHETERIZATION  07/13/2015   Procedure: Peripheral Vascular Atherectomy;  Surgeon: Lorretta Harp, MD;  Location: [facility];  Service: Cardiovascular;;  . PERIPHERAL VASCULAR  INTERVENTION  04/27/2018   Procedure: PERIPHERAL VASCULAR INTERVENTION;  Surgeon: Lorretta Harp, MD;  Location: [facility];  Service: Cardiovascular;;  right popliteal artery  . TONSILLECTOMY     Current Outpatient Medications on File Prior to Visit  Medication Sig Dispense Refill  . amLODipine (NORVASC) 10 MG tablet Take 1 tablet (10 mg total) by mouth daily. 90 tablet 3  . aspirin EC 81 MG tablet Take 1 tablet (81 mg total) by mouth daily. 90 tablet 3   . atorvastatin (LIPITOR) 40 MG tablet TAKE 1 TABLET BY MOUTH EVERY DAY 90 tablet 3  . blood glucose meter kit and supplies Dispense based on patient and insurance preference. Check BS daily. 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 Needs strips and lancets #100 /5 refills 1 each 0  . carvedilol (COREG) 25 MG tablet TAKE 1 TABLET BY MOUTH TWICE A DAY 180 tablet 2  . fenofibrate 160 MG tablet TAKE 1 TABLET BY MOUTH EVERY DAY 90 tablet 1  . Insulin Pen Needle (B-D UF III MINI PEN NEEDLES) 31G X 5 MM MISC USE DAILY WITH VICTOZA 100 each 0  . lisinopril-hydrochlorothiazide (ZESTORETIC) 20-25 MG tablet TAKE 1 TABLET BY MOUTH EVERY DAY 90 tablet 2  . metFORMIN (GLUCOPHAGE) 1000 MG tablet TAKE 1 TABLET BY MOUTH TWICE A DAY WITH MEALS 180 tablet 1  . ONETOUCH ULTRA test strip CHECK BLOOD SUGAR TWICE A DAY 50 strip 15  . pioglitazone (ACTOS) 30 MG tablet TAKE 1 TABLET BY MOUTH EVERY DAY STOP TRADJENTA 90 tablet 2  . vedolizumab (ENTYVIO) 300 MG injection See admin instructions.    Marland Kitchen VICTOZA 18 MG/3ML SOPN INJECT 1.2 MG UNDER THE SKIN ONCE DAILY 6 mL 9   No current facility-administered medications on file prior to visit.   Allergies  Allergen Reactions  . Penicillins Other (See Comments)    Unsure, childhood reaction Has patient had a PCN reaction causing immediate rash, facial/tongue/throat swelling, SOB or lightheadedness with hypotension: Unknown Has patient had a PCN reaction causing severe rash involving mucus membranes or skin necrosis: Unknown Has patient had a PCN reaction that required hospitalization: No Has patient had a PCN reaction occurring within the last 10 years: No If all of the above answers are "NO", then may proceed with Cephalosporin use.    Social History   Socioeconomic History  . Marital status: Married    Spouse name: Not on file  . Number of children: 2  . Years of education: Not on file  . Highest education level: Not on file  Occupational History  . Occupation: Truck Consulting civil engineer: [employer]  Tobacco Use  . Smoking status: Former Smoker    Packs/day: 1.50    Years: 25.00    Pack years: 37.50    Types: Cigarettes    Quit date: 08/12/1996    Years since quitting: 24.2  . Smokeless tobacco: Never Used  Vaping Use  . Vaping Use: Never used  Substance and Sexual Activity  . Alcohol use: Yes    Alcohol/week: 1.0 standard drink    Types: 1 Cans of beer per week    Comment: social use  . Drug use: No  . Sexual activity: Yes  Other Topics Concern  . Not on file  Social History Narrative   ** Merged History Encounter **       Social Determinants of Health   Financial Resource Strain: Not on file  Food Insecurity: Not on file  Transportation  Needs: Not on file  Physical Activity: Not on file  Stress: Not on file  Social Connections: Not on file  Intimate Partner Violence: Not on file      Review of Systems  All other systems reviewed and are negative.      Objective:   Physical Exam Vitals reviewed.  Constitutional:      Appearance: He is well-developed.  Neck:     Thyroid: No thyromegaly.     Vascular: No JVD.  Cardiovascular:     Rate and Rhythm: Normal rate and regular rhythm.     Heart sounds: Murmur heard.    Pulmonary:     Effort: Pulmonary effort is normal. No respiratory distress.     Breath sounds: Normal breath sounds. No wheezing or rales.  Abdominal:     General: Bowel sounds are normal. There is no distension.     Palpations: Abdomen is soft.     Tenderness: There is no abdominal tenderness. There is no guarding or rebound.  Skin:    General: Skin is warm.     Findings: No rash.           Assessment & Plan:   Type 2 diabetes mellitus with other specified complication, unspecified whether long term insulin use (HCC) - Plan: COMPLETE METABOLIC PANEL WITH GFR, Lipid panel, Hemoglobin A1c, Microalbumin, urine  Dyslipidemia  Benign essential HTN  Prostate cancer screening - Plan:  PSA  Stage 3b chronic kidney disease ([place])  Monitor his kidney function closely.  Check a CMP.  Blood pressure today is outstanding.  Symptoms sound consistent with overactive bladder.  I gave him samples of Toviaz 8 mg a day and asked him to try it for 1 to 2 weeks to see if it helps.  Screen for prostate cancer with a PSA.  Check an A1c and a urine microalbumin.  Goal A1c is less than 7.  Goal albumin to creatinine ratio is less than 30.  Check a fasting lipid panel.  Ideally I like his LDL cholesterol below 70 given his history of peripheral vascular disease.

## 2020-11-21 LAB — COMPLETE METABOLIC PANEL WITH GFR
AG Ratio: 1.6 (calc) (ref 1.0–2.5)
ALT: 29 U/L (ref 9–46)
AST: 24 U/L (ref 10–35)
Albumin: 4.1 g/dL (ref 3.6–5.1)
Alkaline phosphatase (APISO): 67 U/L (ref 35–144)
BUN/Creatinine Ratio: 15 (calc) (ref 6–22)
BUN: 25 mg/dL (ref 7–25)
CO2: 25 mmol/L (ref 20–32)
Calcium: 9.4 mg/dL (ref 8.6–10.3)
Chloride: 104 mmol/L (ref 98–110)
Creat: 1.67 mg/dL — ABNORMAL HIGH (ref 0.70–1.25)
GFR, Est African American: 49 mL/min/{1.73_m2} — ABNORMAL LOW (ref >=60)
GFR, Est Non African American: 42 mL/min/{1.73_m2} — ABNORMAL LOW (ref >=60)
Globulin: 2.6 g/dL (calc) (ref 1.9–3.7)
Glucose, Bld: 148 mg/dL — ABNORMAL HIGH (ref 65–99)
Potassium: 4.7 mmol/L (ref 3.5–5.3)
Sodium: 140 mmol/L (ref 135–146)
Total Bilirubin: 0.5 mg/dL (ref 0.2–1.2)
Total Protein: 6.7 g/dL (ref 6.1–8.1)

## 2020-11-21 LAB — LIPID PANEL
Cholesterol: 140 mg/dL (ref <200)
HDL: 34 mg/dL — ABNORMAL LOW (ref >=40)
LDL Cholesterol (Calc): 75 mg/dL (calc)
Non-HDL Cholesterol (Calc): 106 mg/dL (calc) (ref <130)
Total CHOL/HDL Ratio: 4.1 (calc) (ref <5.0)
Triglycerides: 223 mg/dL — ABNORMAL HIGH (ref <150)

## 2020-11-21 LAB — MICROALBUMIN, URINE: Microalb, Ur: 0.2 mg/dL

## 2020-11-21 LAB — HEMOGLOBIN A1C
Hgb A1c MFr Bld: 6.8 % of total Hgb — ABNORMAL HIGH (ref <5.7)
Mean Plasma Glucose: 148 mg/dL
eAG (mmol/L): 8.2 mmol/L

## 2020-11-21 LAB — PSA: PSA: 1.06 ng/mL (ref <=4.0)

## 2020-11-27 ENCOUNTER — Other Ambulatory Visit: Payer: Self-pay

## 2020-11-27 ENCOUNTER — Telehealth: Payer: Self-pay | Admitting: Gastroenterology

## 2020-11-27 DIAGNOSIS — E1169 Type 2 diabetes mellitus with other specified complication: Secondary | ICD-10-CM

## 2020-11-27 MED ORDER — METFORMIN HCL 1000 MG PO TABS: 1.0000 | ORAL_TABLET | Freq: Two times a day (BID) | ORAL | 1 refills | Status: DC

## 2020-11-27 NOTE — Telephone Encounter (Signed)
Patient reports difficulty swallowing and severe GERD.  He will come in tomorrow and see Dr. Fuller Plan at 9:50

## 2020-11-28 ENCOUNTER — Encounter: Payer: Self-pay | Admitting: Gastroenterology

## 2020-11-28 ENCOUNTER — Ambulatory Visit (INDEPENDENT_AMBULATORY_CARE_PROVIDER_SITE_OTHER): Payer: 59 | Admitting: Gastroenterology

## 2020-11-28 VITALS — BP 128/62 | HR 56 | Ht 70.0 in | Wt 226.0 lb

## 2020-11-28 DIAGNOSIS — K219 Gastro-esophageal reflux disease without esophagitis: Secondary | ICD-10-CM | POA: Diagnosis not present

## 2020-11-28 DIAGNOSIS — R1310 Dysphagia, unspecified: Secondary | ICD-10-CM

## 2020-11-28 MED ORDER — PANTOPRAZOLE SODIUM 40 MG PO TBEC: 40.0000 mg | DELAYED_RELEASE_TABLET | Freq: Two times a day (BID) | ORAL | 11 refills | Status: DC

## 2020-11-28 NOTE — Progress Notes (Signed)
    History of Present Illness: This is a 65 year old male who relates worsening problems with reflux symptoms and dysphagia for the past few months.  He primarily notes difficulties swallowing solids but does have occasional difficulty with liquids such as coffee.  He is taken famotidine 20 mg daily for the past couple weeks without improvement in symptoms.  Current Medications, Allergies, Past Medical History, Past Surgical History, Family History and Social History were reviewed in [employer] record.   Physical Exam: General: Well developed, well nourished, no acute distress Head: Normocephalic and atraumatic Eyes: Sclerae anicteric, EOMI Ears: Normal auditory acuity Mouth: Not examined, mask on during Covid-19 pandemic Lungs: Clear throughout to auscultation Heart: Regular rate and rhythm; no murmurs, rubs or bruits Abdomen: Soft, non tender and non distended. No masses, hepatosplenomegaly or hernias noted. Normal Bowel sounds Rectal: Not done Musculoskeletal: Symmetrical with no gross deformities  Pulses:  Normal pulses noted Extremities: No clubbing, cyanosis, edema or deformities noted Neurological: Alert oriented x 4, grossly nonfocal Psychological:  Alert and cooperative. Normal mood and affect   Assessment and Recommendations:  1. GERD, dysphagia.  Rule out esophagitis, esophageal stricture, and other disorders.  Change to pantoprazole 40 mg p.o. twice daily.  Closely follow antireflux measures.  Schedule EGD. The risks (including bleeding, perforation, infection, missed lesions, medication reactions and possible hospitalization or surgery if complications occur), benefits, and alternatives to endoscopy with possible biopsy and possible dilation were discussed with the patient and they consent to proceed.   2.  Chronic left-sided ulcerative colitis well-controlled on Entyvio.  Continue Entyvio.  Surveillance colonoscopy due in November 2022.

## 2020-11-28 NOTE — Patient Instructions (Signed)
We have sent the following medications to your pharmacy for you to pick up at your convenience: pantoprazole 40 mg twice daily.   You have been scheduled for an endoscopy. Please follow written instructions given to you at your visit today. If you use inhalers (even only as needed), please bring them with you on the day of your procedure.  Normal BMI (Body Mass Index- based on height and weight) is between 23 and 30. Your BMI today is Body mass index is 32.43 kg/m. Marland Kitchen Please consider follow up  regarding your BMI with your Primary Care Provider.  Thank you for choosing me and [facility].  Pricilla Riffle. Dagoberto Ligas., MD., [facility] Regal

## 2020-12-01 ENCOUNTER — Encounter: Payer: Self-pay | Admitting: Gastroenterology

## 2020-12-01 ENCOUNTER — Ambulatory Visit (AMBULATORY_SURGERY_CENTER): Payer: 59 | Admitting: Gastroenterology

## 2020-12-01 ENCOUNTER — Other Ambulatory Visit: Payer: Self-pay

## 2020-12-01 VITALS — BP 116/63 | HR 55 | Temp 97.1°F | Resp 11 | Ht 70.0 in | Wt 226.0 lb

## 2020-12-01 DIAGNOSIS — K2100 Gastro-esophageal reflux disease with esophagitis, without bleeding: Secondary | ICD-10-CM | POA: Diagnosis not present

## 2020-12-01 DIAGNOSIS — R1310 Dysphagia, unspecified: Secondary | ICD-10-CM

## 2020-12-01 MED ORDER — SODIUM CHLORIDE 0.9 % IV SOLN: 500.0000 mL | Freq: Once | INTRAVENOUS | Status: DC

## 2020-12-01 NOTE — Progress Notes (Signed)
VS by CW  Pt's states no medical or surgical changes since previsit or office visit.  

## 2020-12-01 NOTE — Progress Notes (Signed)
Called to room to assist during endoscopic procedure.  Patient ID and intended procedure confirmed with present staff. Received instructions for my participation in the procedure from the performing physician.  

## 2020-12-01 NOTE — Op Note (Signed)
[facility] Patient Name: Matthew Chen Procedure Date: 12/01/2020 10:04 AM MRN: [id number] Endoscopist: [name] , MD Age: 65 Referring MD:  Date of Birth: 1955-12-22 Gender: Male Account #: [id number] Procedure:                Upper GI endoscopy Indications:              Dysphagia, Gastroesophageal reflux disease Medicines:                Monitored Anesthesia Care Procedure:                Pre-Anesthesia Assessment:                           - Prior to the procedure, a History and Physical                            was performed, and patient medications and                            allergies were reviewed. The patient's tolerance of                            previous anesthesia was also reviewed. The risks                            and benefits of the procedure and the sedation                            options and risks were discussed with the patient.                            All questions were answered, and informed consent                            was obtained. Prior Anticoagulants: The patient has                            taken no previous anticoagulant or antiplatelet                            agents. ASA Grade Assessment: III - A patient with                            severe systemic disease. After reviewing the risks                            and benefits, the patient was deemed in                            satisfactory condition to undergo the procedure.                           After obtaining informed consent, the endoscope was  passed under direct vision. Throughout the                            procedure, the patient's blood pressure, pulse, and                            oxygen saturations were monitored continuously. The                            Endoscope was introduced through the mouth, and                            advanced to the second part of duodenum. The upper                            GI endoscopy  was accomplished without difficulty.                            The patient tolerated the procedure well. Scope In: Scope Out: Findings:                 LA Grade B (one or more mucosal breaks greater than                            5 mm, not extending between the tops of two mucosal                            folds) esophagitis with no bleeding was found in                            the distal esophagus.                           One benign-appearing, intrinsic moderate stenosis                            was found at the gastroesophageal junction. This                            stenosis measured 1.1 cm (inner diameter) x less                            than one cm (in length). The stenosis was                            traversed. A guidewire was placed and the scope was                            withdrawn. Dilations were performed with Savary                            dilators with mild resistance at 13 mm, 14 mm and  15 mm. No heme.                           The exam of the esophagus was otherwise normal.                           The entire examined stomach was normal.                           The duodenal bulb and second portion of the                            duodenum were normal. Complications:            No immediate complications. Estimated Blood Loss:     Estimated blood loss was minimal. Impression:               - LA Grade B reflux esophagitis with no bleeding.                           - Benign-appearing esophageal stenosis. Dilated.                           - Normal stomach.                           - Normal duodenal bulb and second portion of the                            duodenum.                           - No specimens collected. Recommendation:           - Patient has a contact number available for                            emergencies. The signs and symptoms of potential                            delayed complications were  discussed with the                            patient. Return to normal activities tomorrow.                            Written discharge instructions were provided to the                            patient.                           - Clear liquid diet for 2 hours, then advance as                            tolerated to soft diet today.                           -  Resume prior diet tomorrow.                           - Antireflux measures long term.                           - Continue present medications including                            pantoprazole 40 mg po bid.                           - Return to GI office in 6 weeks. [name], MD 12/01/2020 10:25:53 AM This report has been signed electronically.

## 2020-12-01 NOTE — Patient Instructions (Signed)
Discharge instructions given. Handouts on a dilatation diet and esophagitis. Return to GI office in 6 weeks office will call to schedule. Resume  previous medications. YOU HAD AN ENDOSCOPIC PROCEDURE TODAY AT [facility]:   Refer to the procedure report that was given to you for any specific questions about what was found during the examination.  If the procedure report does not answer your questions, please call your gastroenterologist to clarify.  If you requested that your care partner not be given the details of your procedure findings, then the procedure report has been included in a sealed envelope for you to review at your convenience later.  YOU SHOULD EXPECT: Some feelings of bloating in the abdomen. Passage of more gas than usual.  Walking can help get rid of the air that was put into your GI tract during the procedure and reduce the bloating. If you had a lower endoscopy (such as a colonoscopy or flexible sigmoidoscopy) you may notice spotting of blood in your stool or on the toilet paper. If you underwent a bowel prep for your procedure, you may not have a normal bowel movement for a few days.  Please Note:  You might notice some irritation and congestion in your nose or some drainage.  This is from the oxygen used during your procedure.  There is no need for concern and it should clear up in a day or so.  SYMPTOMS TO REPORT IMMEDIATELY:    Following upper endoscopy (EGD)  Vomiting of blood or coffee ground material  New chest pain or pain under the shoulder blades  Painful or persistently difficult swallowing  New shortness of breath  Fever of 100F or higher  Black, tarry-looking stools  For urgent or emergent issues, a gastroenterologist can be reached at any hour by calling [phone]. Do not use MyChart messaging for urgent concerns.    DIET:  We do recommend a small meal at first, but then you may proceed to your regular diet.  Drink plenty of fluids  but you should avoid alcoholic beverages for 24 hours.  ACTIVITY:  You should plan to take it easy for the rest of today and you should NOT DRIVE or use heavy machinery until tomorrow (because of the sedation medicines used during the test).    FOLLOW UP: Our staff will call the number listed on your records 48-72 hours following your procedure to check on you and address any questions or concerns that you may have regarding the information given to you following your procedure. If we do not reach you, we will leave a message.  We will attempt to reach you two times.  During this call, we will ask if you have developed any symptoms of COVID 19. If you develop any symptoms (ie: fever, flu-like symptoms, shortness of breath, cough etc.) before then, please call [phone].  If you test positive for Covid 19 in the 2 weeks post procedure, please call and report this information to [place].    If any biopsies were taken you will be contacted by phone or by letter within the next 1-3 weeks.  Please call us at [phone] if you have not heard about the biopsies in 3 weeks.    SIGNATURES/CONFIDENTIALITY: You and/or your care partner have signed paperwork which will be entered into your electronic medical record.  These signatures attest to the fact that that the information above on your After Visit Summary has been reviewed and is understood.  Full responsibility  of the confidentiality of this discharge information lies with you and/or your care-partner.

## 2020-12-01 NOTE — Progress Notes (Signed)
To PACU,VSs. Report to [email or web address]

## 2020-12-05 ENCOUNTER — Telehealth: Payer: Self-pay | Admitting: *Deleted

## 2020-12-05 NOTE — Telephone Encounter (Signed)
  Follow up Call-  Call back number 12/01/2020 06/28/2019  Post procedure Call Back phone  # [phone]  Permission to leave phone message Yes Yes  Some recent data might be hidden     Patient questions:  Do you have a fever, pain , or abdominal swelling? No. Pain Score  0 *  Have you tolerated food without any problems? No.  Have you been able to return to your normal activities? Yes.    Do you have any questions about your discharge instructions: Diet   No. Medications  No. Follow up visit  No.  Do you have questions or concerns about your Care? No.  Actions: * If pain score is 4 or above: No action needed, pain <4.  1. Have you developed a fever since your procedure? no  2.   Have you had an respiratory symptoms (SOB or cough) since your procedure? no  3.   Have you tested positive for COVID 19 since your procedure no  4.   Have you had any family members/close contacts diagnosed with the COVID 19 since your procedure?  no   If yes to any of these questions please route to Joylene John, RN and Joella Prince, RN

## 2021-01-08 ENCOUNTER — Other Ambulatory Visit: Payer: Self-pay | Admitting: Family Medicine

## 2021-01-15 ENCOUNTER — Telehealth: Payer: Self-pay | Admitting: Cardiovascular Disease

## 2021-01-15 NOTE — Telephone Encounter (Signed)
Spoke to patient he stated for the past 1 month he has been tired,sob,chest pain with exertion.No chest pain at present.Stated he noticed chest pain more when he started taking Pantoprazole.Advised to keep appointment already scheduled with Dr.Berry 01/17/21 at 11:00 am.Advised to go to ED if needed.

## 2021-01-15 NOTE — Telephone Encounter (Signed)
Pt c/o Shortness Of Breath: STAT if SOB developed within the last 24 hours or pt is noticeably SOB on the phone  1. Are you currently SOB (can you hear that pt is SOB on the phone)? no  2. How long have you been experiencing SOB? A few weeks   3. Are you SOB when sitting or when up moving around? Up and moving around  4. Are you currently experiencing any other symptoms? Patient also gets chest pain when he gets up and starts walking    Patient also states he has not had as much energy as he normally does.

## 2021-01-17 ENCOUNTER — Encounter: Payer: Self-pay | Admitting: Cardiovascular Disease

## 2021-01-17 ENCOUNTER — Other Ambulatory Visit: Payer: Self-pay

## 2021-01-17 ENCOUNTER — Ambulatory Visit (INDEPENDENT_AMBULATORY_CARE_PROVIDER_SITE_OTHER): Payer: 59 | Admitting: Cardiovascular Disease

## 2021-01-17 VITALS — BP 120/60 | HR 58 | Ht 70.0 in | Wt 240.0 lb

## 2021-01-17 DIAGNOSIS — Q231 Congenital insufficiency of aortic valve: Secondary | ICD-10-CM

## 2021-01-17 DIAGNOSIS — R079 Chest pain, unspecified: Secondary | ICD-10-CM

## 2021-01-17 DIAGNOSIS — R0602 Shortness of breath: Secondary | ICD-10-CM

## 2021-01-17 DIAGNOSIS — I10 Essential (primary) hypertension: Secondary | ICD-10-CM

## 2021-01-17 DIAGNOSIS — Z01812 Encounter for preprocedural laboratory examination: Secondary | ICD-10-CM

## 2021-01-17 DIAGNOSIS — I739 Peripheral vascular disease, unspecified: Secondary | ICD-10-CM

## 2021-01-17 NOTE — Assessment & Plan Note (Signed)
History of bicuspid aortic valve with 2D echo performed 05/15/2020 revealing normal LV systolic function, grade 2 diastolic dysfunction with mild aortic stenosis and a mean gradient of 10 mmHg.  His thoracic aorta was mildly dilated at 41 mm.  He is complaining of increasing shortness of breath and exertional chest pain.  I am going to repeat a 2D echocardiogram.

## 2021-01-17 NOTE — Progress Notes (Signed)
01/17/2021 Matthew Chen   [birth year]  [id number]  Primary Physician Pickard, Cammie Mcgee, MD Primary Cardiologist: Lorretta Harp MD Garret Reddish, [place], [place]  HPI:  Matthew Chen is a 65 y.o.  mild to moderately overweight married Caucasian male father of 4 children, grandfather of a grandchildren was referred by Dr. Skeet Latch for evaluation and treatment of claudication. I last saw him in the office  10/10/2020.He has a history of treated hypertension, diabetes and hyperlipidemia. 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 He's never had a heart attack or stroke and denies chest pain or shortness of breath. He does complain of lifestyle limiting claudication and had arterial Doppler studies performed in our office 05/29/15 revealing high-grade right popliteal and distal left SFA disease. Based on this, we decided to proceed with angiography and potential percutaneous revascularization. I initially angiogrammed him on 06/29/15 and performed diamondback orbital rotational atherectomy, drug eluting balloon angioplasty of the distal left SFA for a high-grade, subocclusive calcific/exophytic plaque. He had a high-grade calcific/exophytic subocclusive right popliteal artery plaque for which he underwent diamondback orbital rotation arthrectomy, drug eluding balloon angioplasty on 07/13/15 with an excellent angiographic result. Since that time his claudication has resolved. His Dopplers performed8/6/2019revealed normal ABIs bilaterally with the development of a high-frequency signal in his right popliteal artery.Since I saw him 8 months ago he has developed lifestyle limiting right calf claudication.  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 He had 30 to 40% mid left SFA stenosis. I performed diamondback orbital rotational atherectomy, PTA and stenting using a Tigris 7 mm x  40 mm long self-expanding stent. His Dopplers normalized and his claudication has resolved.  Since I saw him 3 months ago he has noticed exertional chest pain and dyspnea.  He continues to deny claudication.  His most recent Dopplers performed 11/13/2020 revealed normal ABIs bilaterally with a patent right SFA stent.   Current Meds  Medication Sig  . amLODipine (NORVASC) 10 MG tablet Take 1 tablet (10 mg total) by mouth daily.  Marland Kitchen aspirin EC 81 MG tablet Take 1 tablet (81 mg total) by mouth daily.  Marland Kitchen atorvastatin (LIPITOR) 40 MG tablet TAKE 1 TABLET BY MOUTH EVERY DAY  . blood glucose meter kit and supplies Dispense based on patient and insurance preference. Check BS daily. 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 Needs strips and lancets #100 /5 refills  . canagliflozin (INVOKANA) 300 MG TABS tablet 1 tablet  . carvedilol (COREG) 25 MG tablet TAKE 1 TABLET BY MOUTH TWICE A DAY  . fenofibrate 160 MG tablet TAKE 1 TABLET BY MOUTH EVERY DAY  . Insulin Pen Needle (B-D UF III MINI PEN NEEDLES) 31G X 5 MM MISC USE DAILY WITH VICTOZA  . lisinopril-hydrochlorothiazide (ZESTORETIC) 20-25 MG tablet TAKE 1 TABLET BY MOUTH EVERY DAY  . metFORMIN (GLUCOPHAGE) 1000 MG tablet Take 1 tablet (1,000 mg total) by mouth 2 (two) times daily with a meal.  . ONETOUCH ULTRA test strip CHECK BLOOD SUGAR TWICE A DAY  . pantoprazole (PROTONIX) 40 MG tablet Take 1 tablet (40 mg total) by mouth 2 (two) times daily.  . pioglitazone (ACTOS) 30 MG tablet TAKE 1 TABLET BY MOUTH EVERY DAY STOP TRADJENTA  . sildenafil (VIAGRA) 100 MG tablet Take 0.5-1 tablets (50-100 mg total) by mouth daily as needed for erectile dysfunction.  . vedolizumab (ENTYVIO) 300 MG injection See admin  instructions.  Marland Kitchen VICTOZA 18 MG/3ML SOPN INJECT 1.2 MG UNDER THE SKIN ONCE DAILY     Allergies  Allergen Reactions  . Penicillins Other (See Comments)    Unsure, childhood reaction Has patient had a PCN reaction causing immediate rash, facial/tongue/throat swelling,  SOB or lightheadedness with hypotension: Unknown Has patient had a PCN reaction causing severe rash involving mucus membranes or skin necrosis: Unknown Has patient had a PCN reaction that required hospitalization: No Has patient had a PCN reaction occurring within the last 10 years: No If all of the above answers are "NO", then may proceed with Cephalosporin use.     Social History   Socioeconomic History  . Marital status: Married    Spouse name: Not on file  . Number of children: 2  . Years of education: Not on file  . Highest education level: Not on file  Occupational History  . Occupation: Truck Education administrator: [employer]  Tobacco Use  . Smoking status: Former Smoker    Packs/day: 1.50    Years: 25.00    Pack years: 37.50    Types: Cigarettes    Quit date: 08/12/1996    Years since quitting: 24.4  . Smokeless tobacco: Never Used  Vaping Use  . Vaping Use: Never used  Substance and Sexual Activity  . Alcohol use: Yes    Alcohol/week: 1.0 standard drink    Types: 1 Cans of beer per week    Comment: social use  . Drug use: No  . Sexual activity: Yes  Other Topics Concern  . Not on file  Social History Narrative   ** Merged History Encounter **       Social Determinants of Health   Financial Resource Strain: Not on file  Food Insecurity: Not on file  Transportation Needs: Not on file  Physical Activity: Not on file  Stress: Not on file  Social Connections: Not on file  Intimate Partner Violence: Not on file     Review of Systems: General: negative for chills, fever, night sweats or weight changes.  Cardiovascular: negative for chest pain, dyspnea on exertion, edema, orthopnea, palpitations, paroxysmal nocturnal dyspnea or shortness of breath Dermatological: negative for rash Respiratory: negative for cough or wheezing Urologic: negative for hematuria Abdominal: negative for nausea, vomiting, diarrhea, bright red blood per rectum, melena,  or hematemesis Neurologic: negative for visual changes, syncope, or dizziness All other systems reviewed and are otherwise negative except as noted above.    Blood pressure 120/60, pulse (!) 58, height _0  (1.778 m), weight 240 lb (108.9 kg).  General appearance: alert and no distress Neck: no adenopathy, no carotid bruit, no JVD, supple, symmetrical, trachea midline and thyroid not enlarged, symmetric, no tenderness/mass/nodules Lungs: clear to auscultation bilaterally Heart: regular rate and rhythm, S1, S2 normal, no murmur, click, rub or gallop Extremities: extremities normal, atraumatic, no cyanosis or edema Pulses: 2+ and symmetric Skin: Skin color, texture, turgor normal. No rashes or lesions Neurologic: Alert and oriented X 3, normal strength and tone. Normal symmetric reflexes. Normal coordination and gait  EKG sinus bradycardia 58 without ST or T wave changes.  I personally reviewed this EKG.  ASSESSMENT AND PLAN:   Essential hypertension History of essential hypertension a blood pressure measured today at 120/60.  He is on amlodipine carvedilol, lisinopril and hydrochlorothiazide.  Peripheral arterial disease ([place]) History of PAD status post multiple interventions on both SFAs beginning in 2016.  His most recent procedure was 04/27/2018.  Performed orbital atherectomy of a highly calcified above-the-knee right popliteal artery and stented him with a 7 mm x 40 mm long Tigress self-expanding stent with an excellent result.  His most recent Doppler studies performed 11/13/2020 revealed normal ABIs bilaterally with a widely patent stent.  He denies claudication.  This will be repeated on an annual basis.  Dyslipidemia, goal LDL below 70 History of dyslipidemia on statin therapy with lipid profile performed 11/20/2020 revealing total cholesterol 140, LDL 75 and HDL 34.  Bicuspid aortic valve History of bicuspid aortic valve with 2D echo performed 05/15/2020 revealing normal LV  systolic function, grade 2 diastolic dysfunction with mild aortic stenosis and a mean gradient of 10 mmHg.  His thoracic aorta was mildly dilated at 41 mm.  He is complaining of increasing shortness of breath and exertional chest pain.  I am going to repeat a 2D echocardiogram.  Chest pain of uncertain etiology Mr. Earwood has noticed exertional chest pain and dyspnea over the last month or 2.  He was not complaining of this when I saw him in the office 3 months ago.  He does have multiple cardiac risk factors as well as peripheral vascular disease.  I am going to get a 2D echo and a coronary CTA to further evaluate.  I suspect ultimately he will need a left heart cath to further evaluate based on the results of his CTA.      Lorretta Harp MD FACP,FACC,FAHA, [facility] 01/17/2021 11:38 AM

## 2021-01-17 NOTE — Assessment & Plan Note (Signed)
History of PAD status post multiple interventions on both SFAs beginning in 2016.  His most recent procedure was 04/27/2018.  Performed orbital atherectomy of a highly calcified above-the-knee right popliteal artery and stented him with a 7 mm x 40 mm long Tigress self-expanding stent with an excellent result.  His most recent Doppler studies performed 11/13/2020 revealed normal ABIs bilaterally with a widely patent stent.  He denies claudication.  This will be repeated on an annual basis.

## 2021-01-17 NOTE — Patient Instructions (Addendum)
Medication Instructions:  Your physician recommends that you continue on your current medications as directed. Please refer to the Current Medication list given to you today.  *If you need a refill on your cardiac medications before your next appointment, please call your pharmacy*   Lab Work: BMET today   If you have labs (blood work) drawn today and your tests are completely normal, you will receive your results only by: Marland Kitchen MyChart Message (if you have MyChart) OR . A paper copy in the mail If you have any lab test that is abnormal or we need to change your treatment, we will call you to review the results.   Testing/Procedures: Your physician has requested that you have an echocardiogram. Echocardiography is a painless test that uses sound waves to create images of your heart. It provides your doctor with information about the size and shape of your heart and how well your heart's chambers and valves are working. This procedure takes approximately one hour. There are no restrictions for this procedure. -- [address] -- please reschedule October test to ASAP  Coronary CT at [facility] - you will be called to schedule once approved with insurance   Lower Extremity Arterial Dopplers due April 2023   Follow-Up: At [facility], you and your health needs are our priority.  As part of our continuing mission to provide you with exceptional heart care, we have created designated Provider Care Teams.  These Care Teams include your primary Cardiologist (physician) and Advanced Practice Providers (APPs -  Physician Assistants and Nurse Practitioners) who all work together to provide you with the care you need, when you need it.  We recommend signing up for the patient portal called "MyChart".  Sign up information is provided on this After Visit Summary.  MyChart is used to connect with patients for Virtual Visits (Telemedicine).  Patients are able to view lab/test results,  encounter notes, upcoming appointments, etc.  Non-urgent messages can be sent to your provider as well.   To learn more about what you can do with MyChart, go to [email or web address].    Your next appointment:   2-3 week(s) -- after testing  The format for your next appointment:   In Person  Provider:   You may see [name] or one of the following Advanced Practice Providers on your designated Care Team:    [place], [name]  Coletta Memos, FNP    Other Instructions  Your cardiac CT will be scheduled at one of the below locations:   [facility] [address] [place], [place] [address] [phone]  [facility] [address] [place], [place] [address] [phone]  If scheduled at [facility], please arrive at the [facility] main entrance (entrance A) of [facility] 30 minutes prior to test start time. Proceed to the [facility] Radiology Department (first floor) to check-in and test prep.  If scheduled at [facility] please arrive 15 mins early for check-in and test prep.  Please follow these instructions carefully (unless otherwise directed):  Hold all erectile dysfunction medications at least 3 days (72 hrs) prior to test.  On the Night Before the Test: . Be sure to Drink plenty of water. . Do not consume any caffeinated/decaffeinated beverages or chocolate 12 hours prior to your test. . Do not take any antihistamines 12 hours prior to your test.  On the Day of the  Test: . Drink plenty of water until 1 hour prior to the test. . Do not eat any food 4 hours prior to the test. . You may take your regular medications prior to the test.  . HOLD (if you take) Furosemide/Hydrochlorothiazide morning of the test.     After the Test: . Drink plenty of water. . After receiving IV contrast, you may experience a mild flushed feeling. This is normal. . On  occasion, you may experience a mild rash up to 24 hours after the test. This is not dangerous. If this occurs, you can take Benadryl 25 mg and increase your fluid intake. . If you experience trouble breathing, this can be serious. If it is severe call 911 IMMEDIATELY. If it is mild, please call our office. . If you take any of these medications: Glipizide/Metformin, Avandament, Glucavance, please do not take 48 hours after completing test unless otherwise instructed.   Once we have confirmed authorization from your insurance company, we will call you to set up a date and time for your test. Based on how quickly your insurance processes prior authorizations requests, please allow up to 4 weeks to be contacted for scheduling your Cardiac CT appointment. Be advised that routine Cardiac CT appointments could be scheduled as many as 8 weeks after your provider has ordered it.  For non-scheduling related questions, please contact the cardiac imaging nurse navigator should you have any questions/concerns: Marchia Bond, Cardiac Imaging Nurse Navigator Gordy Clement, Cardiac Imaging Nurse Navigator [facility] Direct Office Dial: [phone]   For scheduling needs, including cancellations and rescheduling, please call [place], [phone].

## 2021-01-17 NOTE — Assessment & Plan Note (Signed)
History of dyslipidemia on statin therapy with lipid profile performed 11/20/2020 revealing total cholesterol 140, LDL 75 and HDL 34.

## 2021-01-17 NOTE — Assessment & Plan Note (Signed)
Matthew Chen has noticed exertional chest pain and dyspnea over the last month or 2.  He was not complaining of this when I saw him in the office 3 months ago.  He does have multiple cardiac risk factors as well as peripheral vascular disease.  I am going to get a 2D echo and a coronary CTA to further evaluate.  I suspect ultimately he will need a left heart cath to further evaluate based on the results of his CTA.

## 2021-01-17 NOTE — Assessment & Plan Note (Signed)
History of essential hypertension a blood pressure measured today at 120/60.  He is on amlodipine carvedilol, lisinopril and hydrochlorothiazide.

## 2021-01-18 LAB — BASIC METABOLIC PANEL
BUN/Creatinine Ratio: 11 (ref 10–24)
BUN: 18 mg/dL (ref 8–27)
CO2: 24 mmol/L (ref 20–29)
Calcium: 9.3 mg/dL (ref 8.6–10.2)
Chloride: 103 mmol/L (ref 96–106)
Creatinine, Ser: 1.67 mg/dL — ABNORMAL HIGH (ref 0.76–1.27)
Glucose: 190 mg/dL — ABNORMAL HIGH (ref 65–99)
Potassium: 4.6 mmol/L (ref 3.5–5.2)
Sodium: 140 mmol/L (ref 134–144)
eGFR: 45 mL/min/{1.73_m2} — ABNORMAL LOW (ref >59)

## 2021-01-19 ENCOUNTER — Other Ambulatory Visit: Payer: Self-pay

## 2021-01-19 ENCOUNTER — Ambulatory Visit (HOSPITAL_COMMUNITY): Payer: 59 | Attending: Cardiology

## 2021-01-19 DIAGNOSIS — I77810 Thoracic aortic ectasia: Secondary | ICD-10-CM | POA: Insufficient documentation

## 2021-01-19 DIAGNOSIS — Q231 Congenital insufficiency of aortic valve: Secondary | ICD-10-CM | POA: Diagnosis present

## 2021-01-19 DIAGNOSIS — I712 Thoracic aortic aneurysm, without rupture, unspecified: Secondary | ICD-10-CM

## 2021-01-19 LAB — ECHOCARDIOGRAM COMPLETE
AR max vel: 2.17 cm2
AV Area VTI: 2.32 cm2
AV Area mean vel: 2.12 cm2
AV Mean grad: 8.5 mmHg
AV Peak grad: 18.1 mmHg
Ao pk vel: 2.13 m/s
Area-P 1/2: 3.21 cm2
S' Lateral: 3 cm

## 2021-01-22 ENCOUNTER — Telehealth (HOSPITAL_COMMUNITY): Payer: Self-pay | Admitting: Emergency Medicine

## 2021-01-22 DIAGNOSIS — I712 Thoracic aortic aneurysm, without rupture, unspecified: Secondary | ICD-10-CM

## 2021-01-22 DIAGNOSIS — R0602 Shortness of breath: Secondary | ICD-10-CM

## 2021-01-22 DIAGNOSIS — R002 Palpitations: Secondary | ICD-10-CM

## 2021-01-22 NOTE — Telephone Encounter (Signed)
Reaching out to patient to offer assistance regarding upcoming cardiac imaging study; pt verbalizes understanding of appt date/time, parking situation and where to check in, pre-test NPO status and medications ordered, and verified current allergies; name and call back number provided for further questions should they arise Marchia Bond RN Navigator Cardiac Imaging [facility] [phone] office [phone] cell  Holding viagra, lisinopril-HCTZ, metformin 48 hr post

## 2021-01-23 ENCOUNTER — Ambulatory Visit (HOSPITAL_COMMUNITY)
Admission: RE | Admit: 2021-01-23 | Discharge: 2021-01-23 | Disposition: A | Discharge status: Home / Self Care | Payer: 59 | Source: Ambulatory Visit | Attending: Cardiovascular Disease | Admitting: Cardiovascular Disease

## 2021-01-23 ENCOUNTER — Other Ambulatory Visit: Payer: Self-pay

## 2021-01-23 ENCOUNTER — Encounter (HOSPITAL_COMMUNITY): Payer: Self-pay

## 2021-01-23 ENCOUNTER — Ambulatory Visit (INDEPENDENT_AMBULATORY_CARE_PROVIDER_SITE_OTHER): Payer: 59

## 2021-01-23 DIAGNOSIS — Q231 Congenital insufficiency of aortic valve: Secondary | ICD-10-CM

## 2021-01-23 DIAGNOSIS — R079 Chest pain, unspecified: Secondary | ICD-10-CM

## 2021-01-23 DIAGNOSIS — R0602 Shortness of breath: Secondary | ICD-10-CM

## 2021-01-23 DIAGNOSIS — R002 Palpitations: Secondary | ICD-10-CM

## 2021-01-23 NOTE — Addendum Note (Signed)
Addended by: Lorretta Harp on: 01/23/2021 05:13 PM   Modules accepted: Orders

## 2021-01-23 NOTE — Telephone Encounter (Signed)
Patient called to say that he couldn't get his procedure done today because he heart was in afib. Please advise

## 2021-01-23 NOTE — Telephone Encounter (Signed)
Spoke with pt regarding cancellation of coronary CTA due to pt being in a-fib with heart rate close to 100bpm.   Per [name] we will move forward with a lexiscan, 2 week zio monitor and an CTA of aorta to better evaluate dilation of aorta.  Explained to pt procedures and will have someone from scheduling reach out to have Luke and CTA appointments made.  All questions answered at this time.  Pt verbalizes understanding.

## 2021-01-23 NOTE — Progress Notes (Unsigned)
Patient enrolled for [employer] to mail a 14 day ZIO XT monitor to address on file. 

## 2021-01-23 NOTE — Progress Notes (Signed)
Patient here for CT Cardiac for chest pain. Patient connected to bedside monitor and vital signs taken. Patient heart rate irregular 80-100s (afib), Blood pressure 122/73. Patient states he believes he may have had A.Fib in the past but unsure. Patient denies any chest pain/chest discomfort and states he feels fine. CT heart navigator nurse, Marchia Bond made aware the scan could not be completed due to irregular heart rate. Reason for cancellation explained to patient and family. Patient and family informed that Marchia Bond has reached out to [name] and Dr. Kennon Holter nurse to let them know test could not be completed and to expect a follow up call from office.

## 2021-01-23 NOTE — Addendum Note (Signed)
Addended by: Beatrix Fetters on: 01/23/2021 04:50 PM   Modules accepted: Orders

## 2021-01-24 ENCOUNTER — Telehealth: Payer: Self-pay | Admitting: Cardiovascular Disease

## 2021-01-24 ENCOUNTER — Encounter: Payer: Self-pay | Admitting: Cardiovascular Disease

## 2021-01-24 NOTE — Telephone Encounter (Signed)
I just spoke with this  patient to schedule the Matthew Chen and CTA chest/aorta ordered by Dr. Alberteen Sam wants to know if he can return to work tomorrow.

## 2021-01-24 NOTE — Telephone Encounter (Signed)
pt is needing DOT paperwork filled out, he was informed by his job that he is not able to drive with a heart monitor, he is going to need paperwork filled out in order to be taken out of work. Please advise.

## 2021-01-24 NOTE — Telephone Encounter (Signed)
Spoke with pt on the phone regarding paperwork to be out of work for 2 weeks while wearing zio heart monitor. Explained to pt that he will need to contact his company for information on short term disability, that he will give them a call and start the claim and they will send paperwork to our office to fill out. Explained to pt that we would get paperwork done ASAP once we receive it. Fax number for [facility] office provided to pt. Pt verbalizes understanding.

## 2021-01-25 ENCOUNTER — Telehealth: Payer: Self-pay | Admitting: Cardiovascular Disease

## 2021-01-25 NOTE — Telephone Encounter (Signed)
[facility] received forms from [place] to be completed by [facility], forms were given to [facility] 6/16.

## 2021-01-30 ENCOUNTER — Other Ambulatory Visit: Payer: Self-pay

## 2021-01-30 ENCOUNTER — Ambulatory Visit (INDEPENDENT_AMBULATORY_CARE_PROVIDER_SITE_OTHER)
Admission: RE | Admit: 2021-01-30 | Discharge: 2021-01-30 | Disposition: A | Discharge status: Home / Self Care | Payer: 59 | Source: Ambulatory Visit | Attending: Cardiovascular Disease | Admitting: Cardiovascular Disease

## 2021-01-30 ENCOUNTER — Telehealth: Payer: Self-pay

## 2021-01-30 ENCOUNTER — Ambulatory Visit (HOSPITAL_COMMUNITY)
Admission: RE | Admit: 2021-01-30 | Discharge: 2021-01-30 | Disposition: A | Discharge status: Home / Self Care | Payer: 59 | Source: Ambulatory Visit | Attending: Internal Medicine | Admitting: Internal Medicine

## 2021-01-30 DIAGNOSIS — I712 Thoracic aortic aneurysm, without rupture, unspecified: Secondary | ICD-10-CM

## 2021-01-30 DIAGNOSIS — R0602 Shortness of breath: Secondary | ICD-10-CM | POA: Insufficient documentation

## 2021-01-30 DIAGNOSIS — R002 Palpitations: Secondary | ICD-10-CM

## 2021-01-30 LAB — MYOCARDIAL PERFUSION IMAGING
LV dias vol: 153 mL (ref 62–150)
LV sys vol: 60 mL
Peak HR: 71 {beats}/min
Rest HR: 50 {beats}/min
SDS: 3
SRS: 1
SSS: 4
TID: 1.08

## 2021-01-30 MED ORDER — IOHEXOL 350 MG/ML SOLN
80.0000 mL | Freq: Once | INTRAVENOUS | Status: AC | PRN
  Administered 2021-01-30: 80 mL via INTRAVENOUS

## 2021-01-30 MED ORDER — TECHNETIUM TC 99M TETROFOSMIN IV KIT
10.8000 | PACK | Freq: Once | INTRAVENOUS | Status: AC | PRN
  Administered 2021-01-30: 10.8 via INTRAVENOUS
  Filled 2021-01-30: qty 11

## 2021-01-30 MED ORDER — REGADENOSON 0.4 MG/5ML IV SOLN
0.4000 mg | Freq: Once | INTRAVENOUS | Status: AC
  Administered 2021-01-30: 0.4 mg via INTRAVENOUS

## 2021-01-30 MED ORDER — TECHNETIUM TC 99M TETROFOSMIN IV KIT
31.0000 | PACK | Freq: Once | INTRAVENOUS | Status: AC | PRN
  Administered 2021-01-30: 31 via INTRAVENOUS
  Filled 2021-01-30: qty 31

## 2021-01-30 NOTE — Telephone Encounter (Signed)
Erroneously opened encounter.

## 2021-01-30 NOTE — Telephone Encounter (Signed)
Short term Disability paperwork completed. Pt was in office this morning for procedure, documents signed by pt. As well as release. Returned completed forms to [place] to be faxed.

## 2021-01-31 ENCOUNTER — Other Ambulatory Visit: Payer: Self-pay

## 2021-01-31 ENCOUNTER — Telehealth: Payer: Self-pay | Admitting: Cardiovascular Disease

## 2021-01-31 DIAGNOSIS — R160 Hepatomegaly, not elsewhere classified: Secondary | ICD-10-CM

## 2021-01-31 NOTE — Telephone Encounter (Signed)
[facility] radiology called to give CT results

## 2021-01-31 NOTE — Telephone Encounter (Signed)
Called patient, advised that we had received call in regards to results from [place]- mass like area noted on liver. Dr.Berry reviewed and wanted to order ABD MRI- wanted patient to be aware of this. We will call back once he reviews the other testing. Patient verbalized understanding.

## 2021-02-02 ENCOUNTER — Telehealth: Payer: Self-pay | Admitting: Cardiovascular Disease

## 2021-02-02 NOTE — Telephone Encounter (Signed)
Returned call to pt all questions answered

## 2021-02-02 NOTE — Telephone Encounter (Signed)
Patient was calling in to discuss his results. Please advise

## 2021-02-06 ENCOUNTER — Encounter: Payer: Self-pay | Admitting: Cardiovascular Disease

## 2021-02-06 ENCOUNTER — Other Ambulatory Visit: Payer: Self-pay

## 2021-02-06 ENCOUNTER — Ambulatory Visit (INDEPENDENT_AMBULATORY_CARE_PROVIDER_SITE_OTHER): Payer: 59 | Admitting: Cardiovascular Disease

## 2021-02-06 DIAGNOSIS — R079 Chest pain, unspecified: Secondary | ICD-10-CM

## 2021-02-06 LAB — CBC
Hematocrit: 37.9 % (ref 37.5–51.0)
Hemoglobin: 12.6 g/dL — ABNORMAL LOW (ref 13.0–17.7)
MCH: 29.7 pg (ref 26.6–33.0)
MCHC: 33.2 g/dL (ref 31.5–35.7)
MCV: 89 fL (ref 79–97)
Platelets: 259 10*3/uL (ref 150–450)
RBC: 4.24 x10E6/uL (ref 4.14–5.80)
RDW: 13.7 % (ref 11.6–15.4)
WBC: 4.7 10*3/uL (ref 3.4–10.8)

## 2021-02-06 LAB — BASIC METABOLIC PANEL
BUN/Creatinine Ratio: 13 (ref 10–24)
BUN: 24 mg/dL (ref 8–27)
CO2: 25 mmol/L (ref 20–29)
Calcium: 9.9 mg/dL (ref 8.6–10.2)
Chloride: 101 mmol/L (ref 96–106)
Creatinine, Ser: 1.84 mg/dL — ABNORMAL HIGH (ref 0.76–1.27)
Glucose: 142 mg/dL — ABNORMAL HIGH (ref 65–99)
Potassium: 4.9 mmol/L (ref 3.5–5.2)
Sodium: 139 mmol/L (ref 134–144)
eGFR: 40 mL/min/{1.73_m2} — ABNORMAL LOW (ref >59)

## 2021-02-06 NOTE — H&P (View-Only) (Signed)
02/06/2021 Matthew Chen   April 11, 1956  [id number]  Primary Physician Pickard, Cammie Mcgee, MD Primary Cardiologist: Lorretta Harp MD Garret Reddish, [place], [place]  HPI:  Matthew Chen is a 65 y.o.   mild to moderately overweight married Caucasian male father of 4 children, grandfather of a grandchildren was referred by Dr. Skeet Latch for evaluation and treatment of claudication. I last saw him in the office      01/17/2021 .He has a history of treated hypertension, diabetes and hyperlipidemia. 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 He's never had a heart attack or stroke and denies chest pain or shortness of breath. He does complain of lifestyle limiting claudication and had arterial Doppler studies performed in our office 05/29/15 revealing high-grade right popliteal and distal left SFA disease. Based on this, we decided to proceed with angiography and potential percutaneous revascularization. I initially angiogrammed him on 06/29/15 and performed diamondback orbital rotational atherectomy, drug eluting balloon angioplasty of the distal left SFA for a high-grade, subocclusive calcific/exophytic plaque. He had a high-grade calcific/exophytic subocclusive right popliteal artery plaque for which he underwent diamondback orbital rotation arthrectomy, drug eluding balloon angioplasty on 07/13/15 with an excellent angiographic result. Since that time his claudication has resolved. His Dopplers performed  03/17/2018 revealed normal ABIs bilaterally with the development of a high-frequency signal in his right popliteal artery.  Since I saw him 8 months ago he has developed lifestyle limiting right calf claudication.   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  He had 30 to 40% mid left SFA stenosis.  I performed diamondback orbital rotational atherectomy, PTA and stenting using a Tigris 7  mm x 40 mm long self-expanding stent.  His Dopplers normalized and his claudication has resolved.    His most recent Dopplers performed 11/13/2020 revealed normal ABIs bilaterally with a patent right SFA stent.  When I saw him 3 weeks ago he was complaining of exertional chest pain and dyspnea over the last several months.  I did obtain a 2D echo that was unremarkable with an EF of 60 to 65% with mild MR and mild AAS.  He did have a 46 mm ascending thoracic aortic aneurysm.  Myoview stress test was nonischemic.  A chest CT did show a 7.1 cm mass in his liver and a MRI was suggested.  He is wearing a event monitor currently.  Based on his symptoms we decided to proceed with outpatient diagnostic coronary angiography.   Current Meds  Medication Sig   amLODipine (NORVASC) 10 MG tablet Take 1 tablet (10 mg total) by mouth daily.   aspirin EC 81 MG tablet Take 1 tablet (81 mg total) by mouth daily.   atorvastatin (LIPITOR) 40 MG tablet TAKE 1 TABLET BY MOUTH EVERY DAY   blood glucose meter kit and supplies Dispense based on patient and insurance preference. Check BS daily. 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 Needs strips and lancets #100 /5 refills   canagliflozin (INVOKANA) 300 MG TABS tablet 1 tablet   carvedilol (COREG) 25 MG tablet TAKE 1 TABLET BY MOUTH TWICE A DAY   fenofibrate 160 MG tablet TAKE 1 TABLET BY MOUTH EVERY DAY   Insulin Pen Needle (B-D UF III MINI PEN NEEDLES) 31G X 5 MM MISC USE DAILY WITH VICTOZA   lisinopril-hydrochlorothiazide (ZESTORETIC) 20-25 MG tablet TAKE 1 TABLET BY MOUTH EVERY DAY  metFORMIN (GLUCOPHAGE) 1000 MG tablet Take 1 tablet (1,000 mg total) by mouth 2 (two) times daily with a meal.   ONETOUCH ULTRA test strip CHECK BLOOD SUGAR TWICE A DAY   pantoprazole (PROTONIX) 40 MG tablet Take 1 tablet (40 mg total) by mouth 2 (two) times daily.   pioglitazone (ACTOS) 30 MG tablet TAKE 1 TABLET BY MOUTH EVERY DAY STOP TRADJENTA   sildenafil (VIAGRA) 100 MG tablet Take 0.5-1 tablets  (50-100 mg total) by mouth daily as needed for erectile dysfunction.   vedolizumab (ENTYVIO) 300 MG injection See admin instructions.   VICTOZA 18 MG/3ML SOPN INJECT 1.2 MG UNDER THE SKIN ONCE DAILY     Allergies  Allergen Reactions   Penicillins Other (See Comments)    Unsure, childhood reaction Has patient had a PCN reaction causing immediate rash, facial/tongue/throat swelling, SOB or lightheadedness with hypotension: Unknown Has patient had a PCN reaction causing severe rash involving mucus membranes or skin necrosis: Unknown Has patient had a PCN reaction that required hospitalization: No Has patient had a PCN reaction occurring within the last 10 years: No If all of the above answers are "NO", then may proceed with Cephalosporin use.     Social History   Socioeconomic History   Marital status: Married    Spouse name: Not on file   Number of children: 2   Years of education: Not on file   Highest education level: Not on file  Occupational History   Occupation: Truck driver    Employer: [employer]  Tobacco Use   Smoking status: Former    Packs/day: 1.50    Years: 25.00    Pack years: 37.50    Types: Cigarettes    Quit date: 08/12/1996    Years since quitting: 24.5   Smokeless tobacco: Never  Vaping Use   Vaping Use: Never used  Substance and Sexual Activity   Alcohol use: Yes    Alcohol/week: 1.0 standard drink    Types: 1 Cans of beer per week    Comment: social use   Drug use: No   Sexual activity: Yes  Other Topics Concern   Not on file  Social History Narrative   ** Merged History Encounter **       Social Determinants of Health   Financial Resource Strain: Not on file  Food Insecurity: Not on file  Transportation Needs: Not on file  Physical Activity: Not on file  Stress: Not on file  Social Connections: Not on file  Intimate Partner Violence: Not on file     Review of Systems: General: negative for chills, fever, night sweats or  weight changes.  Cardiovascular: negative for chest pain, dyspnea on exertion, edema, orthopnea, palpitations, paroxysmal nocturnal dyspnea or shortness of breath Dermatological: negative for rash Respiratory: negative for cough or wheezing Urologic: negative for hematuria Abdominal: negative for nausea, vomiting, diarrhea, bright red blood per rectum, melena, or hematemesis Neurologic: negative for visual changes, syncope, or dizziness All other systems reviewed and are otherwise negative except as noted above.    Blood pressure 134/68, pulse 60, height 5' 10" (1.778 m), weight 240 lb (108.9 kg).  General appearance: alert and no distress Neck: no adenopathy, no carotid bruit, no JVD, supple, symmetrical, trachea midline, and thyroid not enlarged, symmetric, no tenderness/mass/nodules Lungs: clear to auscultation bilaterally Heart: regular rate and rhythm, S1, S2 normal, no murmur, click, rub or gallop Extremities: extremities normal, atraumatic, no cyanosis or edema Pulses: 2+ and symmetric Skin: Skin color,   texture, turgor normal. No rashes or lesions Neurologic: Grossly normal  EKG not performed today  ASSESSMENT AND PLAN:   Chest pain of uncertain etiology Mr. Searls returns today for follow-up of chest pain which is fairly predictable and exertional as well as dyspnea on exertion.  His 2D echo revealed normal LV systolic function with mild aortic stenosis.  He also had a 46 mm ascending thoracic aortic aneurysm.  A Myoview stress test was low risk and nonischemic.  The patient continues to have exertional symptoms which are fairly reproducible.  Given his risk factors and his known PAD I feel compelled to perform coronary angiography to define his anatomy. The patient understands that risks included but are not limited to stroke (1 in 1000), death (1 in 30), kidney failure [usually temporary] (1 in 500), bleeding (1 in 200), allergic reaction [possibly serious] (1 in 200).  The  patient understands and agrees to proceed     Lorretta Harp MD [facility], [facility] 02/06/2021 10:49 AM

## 2021-02-06 NOTE — H&P (View-Only) (Signed)
02/06/2021 Matthew Chen   April 11, 1956  [id number]  Primary Physician Pickard, Cammie Mcgee, MD Primary Cardiologist: Lorretta Harp MD Garret Reddish, [place], [place]  HPI:  Matthew Chen is a 65 y.o.   mild to moderately overweight married Caucasian male father of 4 children, grandfather of a grandchildren was referred by Dr. Skeet Latch for evaluation and treatment of claudication. I last saw him in the office      01/17/2021 .He has a history of treated hypertension, diabetes and hyperlipidemia. 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 He's never had a heart attack or stroke and denies chest pain or shortness of breath. He does complain of lifestyle limiting claudication and had arterial Doppler studies performed in our office 05/29/15 revealing high-grade right popliteal and distal left SFA disease. Based on this, we decided to proceed with angiography and potential percutaneous revascularization. I initially angiogrammed him on 06/29/15 and performed diamondback orbital rotational atherectomy, drug eluting balloon angioplasty of the distal left SFA for a high-grade, subocclusive calcific/exophytic plaque. He had a high-grade calcific/exophytic subocclusive right popliteal artery plaque for which he underwent diamondback orbital rotation arthrectomy, drug eluding balloon angioplasty on 07/13/15 with an excellent angiographic result. Since that time his claudication has resolved. His Dopplers performed  03/17/2018 revealed normal ABIs bilaterally with the development of a high-frequency signal in his right popliteal artery.  Since I saw him 8 months ago he has developed lifestyle limiting right calf claudication.   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  He had 30 to 40% mid left SFA stenosis.  I performed diamondback orbital rotational atherectomy, PTA and stenting using a Tigris 7  mm x 40 mm long self-expanding stent.  His Dopplers normalized and his claudication has resolved.    His most recent Dopplers performed 11/13/2020 revealed normal ABIs bilaterally with a patent right SFA stent.  When I saw him 3 weeks ago he was complaining of exertional chest pain and dyspnea over the last several months.  I did obtain a 2D echo that was unremarkable with an EF of 60 to 65% with mild MR and mild AAS.  He did have a 46 mm ascending thoracic aortic aneurysm.  Myoview stress test was nonischemic.  A chest CT did show a 7.1 cm mass in his liver and a MRI was suggested.  He is wearing a event monitor currently.  Based on his symptoms we decided to proceed with outpatient diagnostic coronary angiography.   Current Meds  Medication Sig   amLODipine (NORVASC) 10 MG tablet Take 1 tablet (10 mg total) by mouth daily.   aspirin EC 81 MG tablet Take 1 tablet (81 mg total) by mouth daily.   atorvastatin (LIPITOR) 40 MG tablet TAKE 1 TABLET BY MOUTH EVERY DAY   blood glucose meter kit and supplies Dispense based on patient and insurance preference. Check BS daily. 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 Needs strips and lancets #100 /5 refills   canagliflozin (INVOKANA) 300 MG TABS tablet 1 tablet   carvedilol (COREG) 25 MG tablet TAKE 1 TABLET BY MOUTH TWICE A DAY   fenofibrate 160 MG tablet TAKE 1 TABLET BY MOUTH EVERY DAY   Insulin Pen Needle (B-D UF III MINI PEN NEEDLES) 31G X 5 MM MISC USE DAILY WITH VICTOZA   lisinopril-hydrochlorothiazide (ZESTORETIC) 20-25 MG tablet TAKE 1 TABLET BY MOUTH EVERY DAY  metFORMIN (GLUCOPHAGE) 1000 MG tablet Take 1 tablet (1,000 mg total) by mouth 2 (two) times daily with a meal.   ONETOUCH ULTRA test strip CHECK BLOOD SUGAR TWICE A DAY   pantoprazole (PROTONIX) 40 MG tablet Take 1 tablet (40 mg total) by mouth 2 (two) times daily.   pioglitazone (ACTOS) 30 MG tablet TAKE 1 TABLET BY MOUTH EVERY DAY STOP TRADJENTA   sildenafil (VIAGRA) 100 MG tablet Take 0.5-1 tablets  (50-100 mg total) by mouth daily as needed for erectile dysfunction.   vedolizumab (ENTYVIO) 300 MG injection See admin instructions.   VICTOZA 18 MG/3ML SOPN INJECT 1.2 MG UNDER THE SKIN ONCE DAILY     Allergies  Allergen Reactions   Penicillins Other (See Comments)    Unsure, childhood reaction Has patient had a PCN reaction causing immediate rash, facial/tongue/throat swelling, SOB or lightheadedness with hypotension: Unknown Has patient had a PCN reaction causing severe rash involving mucus membranes or skin necrosis: Unknown Has patient had a PCN reaction that required hospitalization: No Has patient had a PCN reaction occurring within the last 10 years: No If all of the above answers are "NO", then may proceed with Cephalosporin use.     Social History   Socioeconomic History   Marital status: Married    Spouse name: Not on file   Number of children: 2   Years of education: Not on file   Highest education level: Not on file  Occupational History   Occupation: Truck Education administrator: [employer]  Tobacco Use   Smoking status: Former    Packs/day: 1.50    Years: 25.00    Pack years: 37.50    Types: Cigarettes    Quit date: 08/12/1996    Years since quitting: 24.5   Smokeless tobacco: Never  Vaping Use   Vaping Use: Never used  Substance and Sexual Activity   Alcohol use: Yes    Alcohol/week: 1.0 standard drink    Types: 1 Cans of beer per week    Comment: social use   Drug use: No   Sexual activity: Yes  Other Topics Concern   Not on file  Social History Narrative   ** Merged History Encounter **       Social Determinants of Health   Financial Resource Strain: Not on file  Food Insecurity: Not on file  Transportation Needs: Not on file  Physical Activity: Not on file  Stress: Not on file  Social Connections: Not on file  Intimate Partner Violence: Not on file     Review of Systems: General: negative for chills, fever, night sweats or  weight changes.  Cardiovascular: negative for chest pain, dyspnea on exertion, edema, orthopnea, palpitations, paroxysmal nocturnal dyspnea or shortness of breath Dermatological: negative for rash Respiratory: negative for cough or wheezing Urologic: negative for hematuria Abdominal: negative for nausea, vomiting, diarrhea, bright red blood per rectum, melena, or hematemesis Neurologic: negative for visual changes, syncope, or dizziness All other systems reviewed and are otherwise negative except as noted above.    Blood pressure 134/68, pulse 60, height _0  (1.778 m), weight 240 lb (108.9 kg).  General appearance: alert and no distress Neck: no adenopathy, no carotid bruit, no JVD, supple, symmetrical, trachea midline, and thyroid not enlarged, symmetric, no tenderness/mass/nodules Lungs: clear to auscultation bilaterally Heart: regular rate and rhythm, S1, S2 normal, no murmur, click, rub or gallop Extremities: extremities normal, atraumatic, no cyanosis or edema Pulses: 2+ and symmetric Skin: Skin color,  texture, turgor normal. No rashes or lesions Neurologic: Grossly normal  EKG not performed today  ASSESSMENT AND PLAN:   Chest pain of uncertain etiology Mr. Searls returns today for follow-up of chest pain which is fairly predictable and exertional as well as dyspnea on exertion.  His 2D echo revealed normal LV systolic function with mild aortic stenosis.  He also had a 46 mm ascending thoracic aortic aneurysm.  A Myoview stress test was low risk and nonischemic.  The patient continues to have exertional symptoms which are fairly reproducible.  Given his risk factors and his known PAD I feel compelled to perform coronary angiography to define his anatomy. The patient understands that risks included but are not limited to stroke (1 in 1000), death (1 in 30), kidney failure [usually temporary] (1 in 500), bleeding (1 in 200), allergic reaction [possibly serious] (1 in 200).  The  patient understands and agrees to proceed     Lorretta Harp MD [facility], [facility] 02/06/2021 10:49 AM

## 2021-02-06 NOTE — Assessment & Plan Note (Signed)
Matthew Chen returns today for follow-up of chest pain which is fairly predictable and exertional as well as dyspnea on exertion.  His 2D echo revealed normal LV systolic function with mild aortic stenosis.  He also had a 46 mm ascending thoracic aortic aneurysm.  A Myoview stress test was low risk and nonischemic.  The patient continues to have exertional symptoms which are fairly reproducible.  Given his risk factors and his known PAD I feel compelled to perform coronary angiography to define his anatomy. The patient understands that risks included but are not limited to stroke (1 in 1000), death (1 in 65), kidney failure [usually temporary] (1 in 500), bleeding (1 in 200), allergic reaction [possibly serious] (1 in 200). The patient understands and agrees to proceed

## 2021-02-06 NOTE — Patient Instructions (Addendum)
Medication Instructions:  Your Physician recommend you continue on your current medication as directed.    *If you need a refill on your cardiac medications before your next appointment, please call your pharmacy*   Lab Work: Your physician recommends lab work today (CBC, BMP)  If you have labs (blood work) drawn today and your tests are completely normal, you will receive your results only by: [place] (if you have MyChart) OR A paper copy in the mail If you have any lab test that is abnormal or we need to change your treatment, we will call you to review the results.   Testing/Procedures: Your physician has requested that you have a cardiac catheterization. Cardiac catheterization is used to diagnose and/or treat various heart conditions. Doctors may recommend this procedure for a number of different reasons. The most common reason is to evaluate chest pain. Chest pain can be a symptom of coronary artery disease (CAD), and cardiac catheterization can show whether plaque is narrowing or blocking your heart's arteries. This procedure is also used to evaluate the valves, as well as measure the blood flow and oxygen levels in different parts of your heart. For further information please visit [email or web address]. Please follow instruction sheet, as given. [facility]   Follow-Up: At [facility], you and your health needs are our priority.  As part of our continuing mission to provide you with exceptional heart care, we have created designated Provider Care Teams.  These Care Teams include your primary Cardiologist (physician) and Advanced Practice Providers (APPs -  Physician Assistants and Nurse Practitioners) who all work together to provide you with the care you need, when you need it.  We recommend signing up for the patient portal called "MyChart".  Sign up information is provided on this After Visit Summary.  MyChart is used to connect with patients for Virtual Visits  (Telemedicine).  Patients are able to view lab/test results, encounter notes, upcoming appointments, etc.  Non-urgent messages can be sent to your provider as well.   To learn more about what you can do with MyChart, go to [email or web address].    Your next appointment:   2 week(s)  The format for your next appointment:   In Person  Provider:   Quay Burow, MD     [address] [place] [address] Dept: [place]: [place]  02/06/2021  You are scheduled for a Cardiac Catheterization on Thursday, June 30 with Dr. Quay Burow.  1. Please arrive at the [facility] (Main Entrance A) at [facility]: [address] [place], [place] [address] at 9:00 AM (This time is two hours before your procedure to ensure your preparation). Free valet parking service is available.   Special note: Every effort is made to have your procedure done on time. Please understand that emergencies sometimes delay scheduled procedures.  2. Diet: Do not eat solid foods after midnight.  The patient may have clear liquids until 5am upon the day of the procedure.  3. Labs: You will need to have blood drawn today  4. Medication instructions in preparation for your procedure:   Contrast Allergy: No  Hold Lisinopril- Hydrochlorothiazide morning of procedure     Take only 6 units of insulin the night before your procedure. Do not take any insulin on the day of the procedure.  Do not take Diabetes Med Glucophage (Metformin) on the day of the procedure and  HOLD 48 HOURS AFTER THE PROCEDURE.  On the morning of your procedure, take your Aspirin and any morning medicines NOT listed above.  You may use sips of water.  5. Plan for one night stay--bring personal belongings. 6. Bring a current list of your medications and current insurance cards. 7. You MUST have a responsible  person to drive you home. 8. Someone MUST be with you the first 24 hours after you arrive home or your discharge will be delayed. 9. Please wear clothes that are easy to get on and off and wear slip-on shoes.  Thank you for allowing [place] to care for you!   -- Long Invasive Cardiovascular services

## 2021-02-06 NOTE — Progress Notes (Signed)
02/06/2021 Matthew Chen   April 11, 1956  [id number]  Primary Physician Pickard, Cammie Mcgee, MD Primary Cardiologist: Lorretta Harp MD Garret Reddish, [place], [place]  HPI:  Matthew Chen is a 65 y.o.   mild to moderately overweight married Caucasian male father of 4 children, grandfather of a grandchildren was referred by Dr. Skeet Latch for evaluation and treatment of claudication. I last saw him in the office      01/17/2021 .He has a history of treated hypertension, diabetes and hyperlipidemia. 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 He's never had a heart attack or stroke and denies chest pain or shortness of breath. He does complain of lifestyle limiting claudication and had arterial Doppler studies performed in our office 05/29/15 revealing high-grade right popliteal and distal left SFA disease. Based on this, we decided to proceed with angiography and potential percutaneous revascularization. I initially angiogrammed him on 06/29/15 and performed diamondback orbital rotational atherectomy, drug eluting balloon angioplasty of the distal left SFA for a high-grade, subocclusive calcific/exophytic plaque. He had a high-grade calcific/exophytic subocclusive right popliteal artery plaque for which he underwent diamondback orbital rotation arthrectomy, drug eluding balloon angioplasty on 07/13/15 with an excellent angiographic result. Since that time his claudication has resolved. His Dopplers performed  03/17/2018 revealed normal ABIs bilaterally with the development of a high-frequency signal in his right popliteal artery.  Since I saw him 8 months ago he has developed lifestyle limiting right calf claudication.   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  He had 30 to 40% mid left SFA stenosis.  I performed diamondback orbital rotational atherectomy, PTA and stenting using a Tigris 7  mm x 40 mm long self-expanding stent.  His Dopplers normalized and his claudication has resolved.    His most recent Dopplers performed 11/13/2020 revealed normal ABIs bilaterally with a patent right SFA stent.  When I saw him 3 weeks ago he was complaining of exertional chest pain and dyspnea over the last several months.  I did obtain a 2D echo that was unremarkable with an EF of 60 to 65% with mild MR and mild AAS.  He did have a 46 mm ascending thoracic aortic aneurysm.  Myoview stress test was nonischemic.  A chest CT did show a 7.1 cm mass in his liver and a MRI was suggested.  He is wearing a event monitor currently.  Based on his symptoms we decided to proceed with outpatient diagnostic coronary angiography.   Current Meds  Medication Sig   amLODipine (NORVASC) 10 MG tablet Take 1 tablet (10 mg total) by mouth daily.   aspirin EC 81 MG tablet Take 1 tablet (81 mg total) by mouth daily.   atorvastatin (LIPITOR) 40 MG tablet TAKE 1 TABLET BY MOUTH EVERY DAY   blood glucose meter kit and supplies Dispense based on patient and insurance preference. Check BS daily. 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 Needs strips and lancets #100 /5 refills   canagliflozin (INVOKANA) 300 MG TABS tablet 1 tablet   carvedilol (COREG) 25 MG tablet TAKE 1 TABLET BY MOUTH TWICE A DAY   fenofibrate 160 MG tablet TAKE 1 TABLET BY MOUTH EVERY DAY   Insulin Pen Needle (B-D UF III MINI PEN NEEDLES) 31G X 5 MM MISC USE DAILY WITH VICTOZA   lisinopril-hydrochlorothiazide (ZESTORETIC) 20-25 MG tablet TAKE 1 TABLET BY MOUTH EVERY DAY  metFORMIN (GLUCOPHAGE) 1000 MG tablet Take 1 tablet (1,000 mg total) by mouth 2 (two) times daily with a meal.   ONETOUCH ULTRA test strip CHECK BLOOD SUGAR TWICE A DAY   pantoprazole (PROTONIX) 40 MG tablet Take 1 tablet (40 mg total) by mouth 2 (two) times daily.   pioglitazone (ACTOS) 30 MG tablet TAKE 1 TABLET BY MOUTH EVERY DAY STOP TRADJENTA   sildenafil (VIAGRA) 100 MG tablet Take 0.5-1 tablets  (50-100 mg total) by mouth daily as needed for erectile dysfunction.   vedolizumab (ENTYVIO) 300 MG injection See admin instructions.   VICTOZA 18 MG/3ML SOPN INJECT 1.2 MG UNDER THE SKIN ONCE DAILY     Allergies  Allergen Reactions   Penicillins Other (See Comments)    Unsure, childhood reaction Has patient had a PCN reaction causing immediate rash, facial/tongue/throat swelling, SOB or lightheadedness with hypotension: Unknown Has patient had a PCN reaction causing severe rash involving mucus membranes or skin necrosis: Unknown Has patient had a PCN reaction that required hospitalization: No Has patient had a PCN reaction occurring within the last 10 years: No If all of the above answers are "NO", then may proceed with Cephalosporin use.     Social History   Socioeconomic History   Marital status: Married    Spouse name: Not on file   Number of children: 2   Years of education: Not on file   Highest education level: Not on file  Occupational History   Occupation: Truck driver    Employer: [employer]  Tobacco Use   Smoking status: Former    Packs/day: 1.50    Years: 25.00    Pack years: 37.50    Types: Cigarettes    Quit date: 08/12/1996    Years since quitting: 24.5   Smokeless tobacco: Never  Vaping Use   Vaping Use: Never used  Substance and Sexual Activity   Alcohol use: Yes    Alcohol/week: 1.0 standard drink    Types: 1 Cans of beer per week    Comment: social use   Drug use: No   Sexual activity: Yes  Other Topics Concern   Not on file  Social History Narrative   ** Merged History Encounter **       Social Determinants of Health   Financial Resource Strain: Not on file  Food Insecurity: Not on file  Transportation Needs: Not on file  Physical Activity: Not on file  Stress: Not on file  Social Connections: Not on file  Intimate Partner Violence: Not on file     Review of Systems: General: negative for chills, fever, night sweats or  weight changes.  Cardiovascular: negative for chest pain, dyspnea on exertion, edema, orthopnea, palpitations, paroxysmal nocturnal dyspnea or shortness of breath Dermatological: negative for rash Respiratory: negative for cough or wheezing Urologic: negative for hematuria Abdominal: negative for nausea, vomiting, diarrhea, bright red blood per rectum, melena, or hematemesis Neurologic: negative for visual changes, syncope, or dizziness All other systems reviewed and are otherwise negative except as noted above.    Blood pressure 134/68, pulse 60, height 5' 10" (1.778 m), weight 240 lb (108.9 kg).  General appearance: alert and no distress Neck: no adenopathy, no carotid bruit, no JVD, supple, symmetrical, trachea midline, and thyroid not enlarged, symmetric, no tenderness/mass/nodules Lungs: clear to auscultation bilaterally Heart: regular rate and rhythm, S1, S2 normal, no murmur, click, rub or gallop Extremities: extremities normal, atraumatic, no cyanosis or edema Pulses: 2+ and symmetric Skin: Skin color,   texture, turgor normal. No rashes or lesions Neurologic: Grossly normal  EKG not performed today  ASSESSMENT AND PLAN:   Chest pain of uncertain etiology Mr. Searls returns today for follow-up of chest pain which is fairly predictable and exertional as well as dyspnea on exertion.  His 2D echo revealed normal LV systolic function with mild aortic stenosis.  He also had a 46 mm ascending thoracic aortic aneurysm.  A Myoview stress test was low risk and nonischemic.  The patient continues to have exertional symptoms which are fairly reproducible.  Given his risk factors and his known PAD I feel compelled to perform coronary angiography to define his anatomy. The patient understands that risks included but are not limited to stroke (1 in 1000), death (1 in 30), kidney failure [usually temporary] (1 in 500), bleeding (1 in 200), allergic reaction [possibly serious] (1 in 200).  The  patient understands and agrees to proceed     Lorretta Harp MD [facility], [facility] 02/06/2021 10:49 AM

## 2021-02-07 ENCOUNTER — Telehealth: Payer: Self-pay | Admitting: Cardiovascular Disease

## 2021-02-07 ENCOUNTER — Telehealth: Payer: Self-pay | Admitting: *Deleted

## 2021-02-07 NOTE — Telephone Encounter (Signed)
Returned call to patient who states he received a call from a research nurse named Janett Billow who asked him if he would be willing to participate in a study that would help doctors better discern if patients need a heart catheterization in the future. I advised him that our CV research department is very active at [facility] and we do not always know which studies they are conducting. He states he received a consent form to his email. I advised him to review it carefully and request to speak with someone when he arrives at [facility] tomorrow if he still has questions or is uncertain. I apologized for not having any additional information to provide to him and he thanked me for the call.

## 2021-02-07 NOTE — Telephone Encounter (Signed)
Pt contacted pre-catheterization scheduled at [facility] for: Thursday February 08, 2021 11 AM Verified arrival time and place: [facility]) at: 6 AM -pre-procedure hydration   No solid food after midnight prior to cath, clear liquids until 5 AM day of procedure.  Hold: Lisinopril/HCT-day before and day of procedure-GFR 40-already taken this morning Metformin-day of procedure and 48 hours post procedure Actos-AM of procedure Victoza-1/2 usual dose HS prior to procedure   Except hold medications AM meds can be  taken pre-cath with sips of water including: aspirin 81 mg   Confirmed patient has responsible adult to drive home post procedure and be with patient first 24 hours after arriving home: yes  You are allowed ONE visitor in the waiting room during the time you are at the hospital for your procedure. Both you and your visitor must wear a mask once you enter the hospital.   Patient reports does not currently have any symptoms concerning for COVID-19 and no household members with COVID-19 like illness.     Reviewed procedure/mask/visitor instructions, pre-procedure hydration with patient.

## 2021-02-07 NOTE — Telephone Encounter (Signed)
New messge:     Patient calling to see if he could do a study before his apt tomorrow. Patient states some one called today concering this. Please advise.

## 2021-02-08 ENCOUNTER — Ambulatory Visit (HOSPITAL_COMMUNITY)
Admission: RE | Admit: 2021-02-08 | Discharge: 2021-02-08 | Disposition: A | Discharge status: Home / Self Care | Payer: 59 | Attending: Cardiovascular Disease | Admitting: Cardiovascular Disease

## 2021-02-08 ENCOUNTER — Other Ambulatory Visit: Payer: Self-pay

## 2021-02-08 ENCOUNTER — Encounter (HOSPITAL_COMMUNITY)
Admission: RE | Disposition: A | Discharge status: Home / Self Care | Payer: Self-pay | Source: Home / Self Care | Attending: Cardiovascular Disease

## 2021-02-08 DIAGNOSIS — Z79899 Other long term (current) drug therapy: Secondary | ICD-10-CM | POA: Insufficient documentation

## 2021-02-08 DIAGNOSIS — Z87891 Personal history of nicotine dependence: Secondary | ICD-10-CM | POA: Insufficient documentation

## 2021-02-08 DIAGNOSIS — I2510 Atherosclerotic heart disease of native coronary artery without angina pectoris: Secondary | ICD-10-CM | POA: Insufficient documentation

## 2021-02-08 DIAGNOSIS — Z7984 Long term (current) use of oral hypoglycemic drugs: Secondary | ICD-10-CM | POA: Insufficient documentation

## 2021-02-08 DIAGNOSIS — E1151 Type 2 diabetes mellitus with diabetic peripheral angiopathy without gangrene: Secondary | ICD-10-CM | POA: Diagnosis not present

## 2021-02-08 DIAGNOSIS — I712 Thoracic aortic aneurysm, without rupture: Secondary | ICD-10-CM | POA: Insufficient documentation

## 2021-02-08 DIAGNOSIS — Z794 Long term (current) use of insulin: Secondary | ICD-10-CM | POA: Insufficient documentation

## 2021-02-08 DIAGNOSIS — Z7982 Long term (current) use of aspirin: Secondary | ICD-10-CM | POA: Diagnosis not present

## 2021-02-08 DIAGNOSIS — I10 Essential (primary) hypertension: Secondary | ICD-10-CM | POA: Insufficient documentation

## 2021-02-08 DIAGNOSIS — Z880 Allergy status to penicillin: Secondary | ICD-10-CM | POA: Diagnosis not present

## 2021-02-08 DIAGNOSIS — E785 Hyperlipidemia, unspecified: Secondary | ICD-10-CM | POA: Diagnosis not present

## 2021-02-08 DIAGNOSIS — R079 Chest pain, unspecified: Secondary | ICD-10-CM | POA: Diagnosis present

## 2021-02-08 HISTORY — PX: LEFT HEART CATH AND CORONARY ANGIOGRAPHY: CATH118249

## 2021-02-08 LAB — GLUCOSE, CAPILLARY: Glucose-Capillary: 166 mg/dL — ABNORMAL HIGH (ref 70–99)

## 2021-02-08 SURGERY — LEFT HEART CATH AND CORONARY ANGIOGRAPHY
Anesthesia: LOCAL

## 2021-02-08 MED ORDER — SODIUM CHLORIDE 0.9% FLUSH: 3.0000 mL | Freq: Two times a day (BID) | INTRAVENOUS | Status: DC

## 2021-02-08 MED ORDER — CLOPIDOGREL BISULFATE 75 MG PO TABS: 75.0000 mg | ORAL_TABLET | Freq: Every day | ORAL | 0 refills | Status: DC

## 2021-02-08 MED ORDER — AMLODIPINE BESYLATE 10 MG PO TABS: 10.0000 mg | ORAL_TABLET | Freq: Every day | ORAL | Status: DC

## 2021-02-08 MED ORDER — HYDRALAZINE HCL 20 MG/ML IJ SOLN: 10.0000 mg | INTRAMUSCULAR | Status: DC | PRN

## 2021-02-08 MED ORDER — ISOSORBIDE MONONITRATE ER 30 MG PO TB24: 30.0000 mg | ORAL_TABLET | Freq: Every day | ORAL | 0 refills | Status: DC

## 2021-02-08 MED ORDER — FENOFIBRATE 160 MG PO TABS: 160.0000 mg | ORAL_TABLET | Freq: Every day | ORAL | Status: DC

## 2021-02-08 MED ORDER — METFORMIN HCL 500 MG PO TABS: 1000.0000 mg | ORAL_TABLET | Freq: Two times a day (BID) | ORAL | Status: DC

## 2021-02-08 MED ORDER — PIOGLITAZONE HCL 30 MG PO TABS: 30.0000 mg | ORAL_TABLET | Freq: Every day | ORAL | Status: DC

## 2021-02-08 MED ORDER — ACETAMINOPHEN 325 MG PO TABS: 650.0000 mg | ORAL_TABLET | ORAL | Status: DC | PRN

## 2021-02-08 MED ORDER — VERAPAMIL HCL 2.5 MG/ML IV SOLN
INTRAVENOUS | Status: AC
  Filled 2021-02-08: qty 2

## 2021-02-08 MED ORDER — SILDENAFIL CITRATE 100 MG PO TABS: 50.0000 mg | ORAL_TABLET | Freq: Every day | ORAL | Status: DC | PRN

## 2021-02-08 MED ORDER — ASPIRIN 81 MG PO CHEW: 81.0000 mg | CHEWABLE_TABLET | ORAL | Status: DC

## 2021-02-08 MED ORDER — LABETALOL HCL 5 MG/ML IV SOLN: 10.0000 mg | INTRAVENOUS | Status: DC | PRN

## 2021-02-08 MED ORDER — SODIUM CHLORIDE 0.9 % IV SOLN: INTRAVENOUS | Status: AC

## 2021-02-08 MED ORDER — HEPARIN (PORCINE) IN NACL 1000-0.9 UT/500ML-% IV SOLN
INTRAVENOUS | Status: DC | PRN
  Administered 2021-02-08 (×3): 500 mL

## 2021-02-08 MED ORDER — LISINOPRIL-HYDROCHLOROTHIAZIDE 20-25 MG PO TABS: 1.0000 | ORAL_TABLET | Freq: Every day | ORAL | Status: DC

## 2021-02-08 MED ORDER — PANTOPRAZOLE SODIUM 40 MG PO TBEC: 40.0000 mg | DELAYED_RELEASE_TABLET | Freq: Two times a day (BID) | ORAL | Status: DC

## 2021-02-08 MED ORDER — HEPARIN (PORCINE) IN NACL 1000-0.9 UT/500ML-% IV SOLN
INTRAVENOUS | Status: AC
  Filled 2021-02-08: qty 500

## 2021-02-08 MED ORDER — MIDAZOLAM HCL 2 MG/2ML IJ SOLN
INTRAMUSCULAR | Status: AC
  Filled 2021-02-08: qty 2

## 2021-02-08 MED ORDER — FENTANYL CITRATE (PF) 100 MCG/2ML IJ SOLN
INTRAMUSCULAR | Status: DC | PRN
  Administered 2021-02-08: 25 ug via INTRAVENOUS

## 2021-02-08 MED ORDER — SODIUM CHLORIDE 0.9 % WEIGHT BASED INFUSION
1.0000 mL/kg/h | INTRAVENOUS | Status: DC
  Administered 2021-02-08: 1 mL/kg/h via INTRAVENOUS

## 2021-02-08 MED ORDER — ONDANSETRON HCL 4 MG/2ML IJ SOLN: 4.0000 mg | Freq: Four times a day (QID) | INTRAMUSCULAR | Status: DC | PRN

## 2021-02-08 MED ORDER — VERAPAMIL HCL 2.5 MG/ML IV SOLN
INTRA_ARTERIAL | Status: DC | PRN
  Administered 2021-02-08: 5 mL via INTRA_ARTERIAL

## 2021-02-08 MED ORDER — SODIUM CHLORIDE 0.9 % WEIGHT BASED INFUSION
3.0000 mL/kg/h | INTRAVENOUS | Status: AC
  Administered 2021-02-08: 3 mL/kg/h via INTRAVENOUS

## 2021-02-08 MED ORDER — MORPHINE SULFATE (PF) 2 MG/ML IV SOLN: 2.0000 mg | INTRAVENOUS | Status: DC | PRN

## 2021-02-08 MED ORDER — BLOOD GLUCOSE METER KIT: PACK | Freq: Every morning | Status: DC

## 2021-02-08 MED ORDER — IOHEXOL 350 MG/ML SOLN
INTRAVENOUS | Status: DC | PRN
  Administered 2021-02-08: 90 mL via INTRA_ARTERIAL

## 2021-02-08 MED ORDER — SODIUM CHLORIDE 0.9 % IV SOLN: 250.0000 mL | INTRAVENOUS | Status: DC | PRN

## 2021-02-08 MED ORDER — ISOSORBIDE MONONITRATE ER 30 MG PO TB24
30.0000 mg | ORAL_TABLET | Freq: Every day | ORAL | Status: DC
  Administered 2021-02-08: 30 mg via ORAL
  Filled 2021-02-08: qty 1

## 2021-02-08 MED ORDER — LIDOCAINE HCL (PF) 1 % IJ SOLN
INTRAMUSCULAR | Status: DC | PRN
  Administered 2021-02-08: 2 mL

## 2021-02-08 MED ORDER — VEDOLIZUMAB 300 MG IV SOLR: 300.0000 mg | INTRAVENOUS | Status: DC

## 2021-02-08 MED ORDER — HEPARIN SODIUM (PORCINE) 1000 UNIT/ML IJ SOLN
INTRAMUSCULAR | Status: AC
  Filled 2021-02-08: qty 1

## 2021-02-08 MED ORDER — NITROGLYCERIN 1 MG/10 ML FOR IR/CATH LAB
INTRA_ARTERIAL | Status: AC
  Filled 2021-02-08: qty 10

## 2021-02-08 MED ORDER — FENTANYL CITRATE (PF) 100 MCG/2ML IJ SOLN
INTRAMUSCULAR | Status: AC
  Filled 2021-02-08: qty 2

## 2021-02-08 MED ORDER — INSULIN PEN NEEDLE 31G X 5 MM MISC: Freq: Every morning | Status: DC

## 2021-02-08 MED ORDER — HEPARIN SODIUM (PORCINE) 1000 UNIT/ML IJ SOLN
INTRAMUSCULAR | Status: DC | PRN
  Administered 2021-02-08: 5000 [IU] via INTRAVENOUS

## 2021-02-08 MED ORDER — ATORVASTATIN CALCIUM 40 MG PO TABS: 40.0000 mg | ORAL_TABLET | Freq: Every day | ORAL | Status: DC

## 2021-02-08 MED ORDER — SODIUM CHLORIDE 0.9% FLUSH: 3.0000 mL | INTRAVENOUS | Status: DC | PRN

## 2021-02-08 MED ORDER — CARVEDILOL 25 MG PO TABS: 25.0000 mg | ORAL_TABLET | Freq: Two times a day (BID) | ORAL | Status: DC

## 2021-02-08 MED ORDER — ASPIRIN EC 81 MG PO TBEC: 81.0000 mg | DELAYED_RELEASE_TABLET | Freq: Every day | ORAL | Status: DC

## 2021-02-08 MED ORDER — CLOPIDOGREL BISULFATE 75 MG PO TABS: 75.0000 mg | ORAL_TABLET | Freq: Every day | ORAL | Status: DC

## 2021-02-08 MED ORDER — MIDAZOLAM HCL 2 MG/2ML IJ SOLN
INTRAMUSCULAR | Status: DC | PRN
  Administered 2021-02-08: 1 mg via INTRAVENOUS

## 2021-02-08 MED ORDER — ASPIRIN 81 MG PO CHEW: 81.0000 mg | CHEWABLE_TABLET | Freq: Every day | ORAL | Status: DC

## 2021-02-08 MED ORDER — GLUCOSE BLOOD VI STRP: 1.0000 | ORAL_STRIP | Status: DC | PRN

## 2021-02-08 MED ORDER — LIDOCAINE HCL (PF) 1 % IJ SOLN
INTRAMUSCULAR | Status: AC
  Filled 2021-02-08: qty 30

## 2021-02-08 MED ORDER — LIRAGLUTIDE 18 MG/3ML ~~LOC~~ SOPN: 1.2000 mg | PEN_INJECTOR | Freq: Every day | SUBCUTANEOUS | Status: DC

## 2021-02-08 SURGICAL SUPPLY — 8 items
CATH OPTITORQUE TIG 4.0 5F (CATHETERS) ×2 IMPLANT
DEVICE RAD COMP TR BAND LRG (VASCULAR PRODUCTS) ×2 IMPLANT
GLIDESHEATH SLEND A-KIT 6F 22G (SHEATH) ×2 IMPLANT
KIT HEART LEFT (KITS) ×2 IMPLANT
PACK CARDIAC CATHETERIZATION (CUSTOM PROCEDURE TRAY) ×2 IMPLANT
TRANSDUCER W/STOPCOCK (MISCELLANEOUS) ×2 IMPLANT
TUBING CIL FLEX 10 FLL-RA (TUBING) ×2 IMPLANT
WIRE HI TORQ VERSACORE J 260CM (WIRE) ×2 IMPLANT

## 2021-02-08 NOTE — Interval H&P Note (Signed)
Cath Lab Visit (complete for each Cath Lab visit)  Clinical Evaluation Leading to the Procedure:   ACS: No.  Non-ACS:    Anginal Classification: CCS II  Anti-ischemic medical therapy: Minimal Therapy (1 class of medications)  Non-Invasive Test Results: Low-risk stress test findings: cardiac mortality <1%/year  Prior CABG: No previous CABG      History and Physical Interval Note:  02/08/2021 11:11 AM  Matthew Chen  has presented today for surgery, with the diagnosis of chest pain.  The various methods of treatment have been discussed with the patient and family. After consideration of risks, benefits and other options for treatment, the patient has consented to  Procedure(s): LEFT HEART CATH AND CORONARY ANGIOGRAPHY (N/A) as a surgical intervention.  The patient's history has been reviewed, patient examined, no change in status, stable for surgery.  I have reviewed the patient's chart and labs.  Questions were answered to the patient's satisfaction.     Quay Burow

## 2021-02-08 NOTE — Discharge Instructions (Signed)
Radial Site Care  This sheet gives you information about how to care for yourself after your procedure. Your health care provider may also give you more specific instructions. If you have problems or questions, contact your health care provider. What can I expect after the procedure? After the procedure, it is common to have: Bruising and tenderness at the catheter insertion area. Follow these instructions at home: Medicines Take over-the-counter and prescription medicines only as told by your health care provider. Insertion site care Follow instructions from your health care provider about how to take care of your insertion site. Make sure you: Wash your hands with soap and water before you remove your bandage (dressing). If soap and water are not available, use hand sanitizer. May remove dressing in 24 hours. Check your insertion site every day for signs of infection. Check for: Redness, swelling, or pain. Fluid or blood. Pus or a bad smell. Warmth. Do no take baths, swim, or use a hot tub for 5 days. You may shower 24-48 hours after the procedure. Remove the dressing and gently wash the site with plain soap and water. Pat the area dry with a clean towel. Do not rub the site. That could cause bleeding. Do not apply powder or lotion to the site. Activity  For 24 hours after the procedure, or as directed by your health care provider: Do not flex or bend the affected arm. Do not push or pull heavy objects with the affected arm. Do not drive yourself home from the hospital or clinic. You may drive 24 hours after the procedure. Do not operate machinery or power tools. KEEP ARM ELEVATED THE REMAINDER OF THE DAY. Do not push, pull or lift anything that is heavier than 10 lb for 5 days. Ask your health care provider when it is okay to: Return to work or school. Resume usual physical activities or sports. Resume sexual activity. General instructions If the catheter site starts to  bleed, raise your arm and put firm pressure on the site. If the bleeding does not stop, get help right away. This is a medical emergency. DRINK PLENTY OF FLUIDS FOR THE NEXT 2-3 DAYS. No alcohol consumption for 24 hours after receiving sedation. If you went home on the same day as your procedure, a responsible adult should be with you for the first 24 hours after you arrive home. Keep all follow-up visits as told by your health care provider. This is important. Contact a health care provider if: You have a fever. You have redness, swelling, or yellow drainage around your insertion site. Get help right away if: You have unusual pain at the radial site. The catheter insertion area swells very fast. The insertion area is bleeding, and the bleeding does not stop when you hold steady pressure on the area. Your arm or hand becomes pale, cool, tingly, or numb. These symptoms may represent a serious problem that is an emergency. Do not wait to see if the symptoms will go away. Get medical help right away. Call your local emergency services (911 in the [place] Do not drive yourself to the hospital. Summary After the procedure, it is common to have bruising and tenderness at the site. Follow instructions from your health care provider about how to take care of your radial site wound. Check the wound every day for signs of infection.  This information is not intended to replace advice given to you by your health care provider. Make sure you discuss any questions you have with   your health care provider. Document Revised: 09/03/2017 Document Reviewed: 09/03/2017 Elsevier Patient Education  2020 Elsevier Inc.  

## 2021-02-08 NOTE — Research (Signed)
IDENTIFY Informed Consent                  Subject Name: Matthew Chen   Subject met inclusion and exclusion criteria.  The informed consent form, study requirements and expectations were reviewed with the subject and questions and concerns were addressed prior to the signing of the consent form.  The subject verbalized understanding of the trial requirements.  The subject agreed to participate in the IDENTIFY trial and signed the informed consent at 0812 on 08-Feb-2021.  The informed consent was obtained prior to performance of any protocol-specific procedures for the subject.  A copy of the signed informed consent was given to the subject and a copy was placed in the subject's medical record.    Ledon Snare , Research Assistant

## 2021-02-09 ENCOUNTER — Telehealth: Payer: Self-pay | Admitting: Cardiovascular Disease

## 2021-02-09 ENCOUNTER — Encounter (HOSPITAL_COMMUNITY): Payer: Self-pay | Admitting: Cardiovascular Disease

## 2021-02-09 NOTE — Telephone Encounter (Signed)
Denny Peon is following up regarding the patient's FMLA paperwork. She states she is in the process of reviewing his paperwork to make a decision on the claim, but she saw that he was also seen on 0628/22. She is hoping someone can call her to discuss that visit prior to her decision.    Phone#: [phone] (option 3 / claim #: [id number]

## 2021-02-13 ENCOUNTER — Other Ambulatory Visit: Payer: Self-pay | Admitting: *Deleted

## 2021-02-13 ENCOUNTER — Telehealth: Payer: Self-pay | Admitting: *Deleted

## 2021-02-13 DIAGNOSIS — Z01812 Encounter for preprocedural laboratory examination: Secondary | ICD-10-CM

## 2021-02-13 DIAGNOSIS — I2510 Atherosclerotic heart disease of native coronary artery without angina pectoris: Secondary | ICD-10-CM

## 2021-02-13 LAB — BASIC METABOLIC PANEL
BUN/Creatinine Ratio: 13 (ref 10–24)
BUN: 22 mg/dL (ref 8–27)
CO2: 25 mmol/L (ref 20–29)
Calcium: 9.4 mg/dL (ref 8.6–10.2)
Chloride: 101 mmol/L (ref 96–106)
Creatinine, Ser: 1.74 mg/dL — ABNORMAL HIGH (ref 0.76–1.27)
Glucose: 254 mg/dL — ABNORMAL HIGH (ref 65–99)
Potassium: 4.6 mmol/L (ref 3.5–5.2)
Sodium: 140 mmol/L (ref 134–144)
eGFR: 43 mL/min/{1.73_m2} — ABNORMAL LOW (ref >59)

## 2021-02-13 NOTE — Telephone Encounter (Signed)
Pt contacted pre-catheterization scheduled at [facility] for: Thursday February 15, 2021 12 Noon Verified arrival time and place: [facility]) at: 6 AM-pre-procedure hydration   No solid food after midnight prior to cath, clear liquids until 5 AM day of procedure.  Hold: Lisinopril/HCT-day before and day of procedure Metformin-day of procedure and 48 hours post procedure Actos-AM of procedure Insulin/Victoza-1/2 usual dose HS prior to procedure  Except hold medications AM meds can be  taken pre-cath with sips of water including: aspirin 81 mg Plavix 75 mg  Confirmed patient has responsible adult to drive home post procedure and be with patient first 24 hours after arriving home:  You are allowed ONE visitor in the waiting room during the time you are at the hospital for your procedure. Both you and your visitor must wear a mask once you enter the hospital.   Patient reports does not currently have any symptoms concerning for COVID-19 and no household members with COVID-19 like illness.

## 2021-02-13 NOTE — Telephone Encounter (Signed)
Patient will go to [facility] lab this morning for BMP/CBC.

## 2021-02-14 ENCOUNTER — Telehealth: Payer: Self-pay | Admitting: *Deleted

## 2021-02-14 NOTE — Telephone Encounter (Signed)
Pt contacted pre-coronary atherectomy scheduled at [facility] for: Thursday February 15, 2021 12 Noon Verified arrival time and place: [facility]) at: 6 AM-pre-procedure hydration   No solid food after midnight prior to cath, clear liquids until 5 AM day of procedure.  Hold: Lisinopril-HCT-day before and day of procedure-GFR 43 Metformin-day of procedure and 48 hours post procedure Actos-AM of procedure Victoza/Insulin-1/2 usual dose HS prior to procedure  Except hold medications AM meds can be  taken pre-cath with sips of water including: aspirin 81 mg Plavix 75 mg  Confirmed patient has responsible adult to drive home post procedure and be with patient first 24 hours after arriving home: yes  You are allowed ONE visitor in the waiting room during the time you are at the hospital for your procedure. Both you and your visitor must wear a mask once you enter the hospital.   Patient reports does not currently have any symptoms concerning for COVID-19 and no household members with COVID-19 like illness.      Reviewed procedure/mask/visitor instructions with patient.

## 2021-02-14 NOTE — Telephone Encounter (Signed)
CBC not done 02/13/21 , pt aware will get on arrival to hospital AM of procedure.

## 2021-02-15 ENCOUNTER — Encounter (HOSPITAL_COMMUNITY)
Admission: RE | Disposition: A | Discharge status: Home / Self Care | Payer: Self-pay | Source: Home / Self Care | Attending: Cardiovascular Disease

## 2021-02-15 ENCOUNTER — Ambulatory Visit (HOSPITAL_BASED_OUTPATIENT_CLINIC_OR_DEPARTMENT_OTHER): Payer: 59

## 2021-02-15 ENCOUNTER — Telehealth: Payer: Self-pay | Admitting: *Deleted

## 2021-02-15 ENCOUNTER — Inpatient Hospital Stay (HOSPITAL_COMMUNITY)
Admission: RE | Admit: 2021-02-15 | Discharge: 2021-02-17 | DRG: 981 | Disposition: A | Discharge status: Home / Self Care | Payer: 59 | Attending: Cardiovascular Disease | Admitting: Cardiovascular Disease

## 2021-02-15 ENCOUNTER — Encounter (HOSPITAL_COMMUNITY): Payer: Self-pay | Admitting: Cardiovascular Disease

## 2021-02-15 ENCOUNTER — Other Ambulatory Visit: Payer: Self-pay

## 2021-02-15 DIAGNOSIS — I4891 Unspecified atrial fibrillation: Secondary | ICD-10-CM | POA: Diagnosis present

## 2021-02-15 DIAGNOSIS — I9751 Accidental puncture and laceration of a circulatory system organ or structure during a circulatory system procedure: Principal | ICD-10-CM | POA: Diagnosis present

## 2021-02-15 DIAGNOSIS — I10 Essential (primary) hypertension: Secondary | ICD-10-CM | POA: Diagnosis present

## 2021-02-15 DIAGNOSIS — I2510 Atherosclerotic heart disease of native coronary artery without angina pectoris: Secondary | ICD-10-CM

## 2021-02-15 DIAGNOSIS — Z880 Allergy status to penicillin: Secondary | ICD-10-CM

## 2021-02-15 DIAGNOSIS — Z87891 Personal history of nicotine dependence: Secondary | ICD-10-CM

## 2021-02-15 DIAGNOSIS — I25110 Atherosclerotic heart disease of native coronary artery with unstable angina pectoris: Secondary | ICD-10-CM | POA: Diagnosis present

## 2021-02-15 DIAGNOSIS — N183 Chronic kidney disease, stage 3 unspecified: Secondary | ICD-10-CM | POA: Diagnosis present

## 2021-02-15 DIAGNOSIS — E1151 Type 2 diabetes mellitus with diabetic peripheral angiopathy without gangrene: Secondary | ICD-10-CM | POA: Diagnosis present

## 2021-02-15 DIAGNOSIS — Z794 Long term (current) use of insulin: Secondary | ICD-10-CM

## 2021-02-15 DIAGNOSIS — I700 Atherosclerosis of aorta: Secondary | ICD-10-CM | POA: Diagnosis present

## 2021-02-15 DIAGNOSIS — I25118 Atherosclerotic heart disease of native coronary artery with other forms of angina pectoris: Secondary | ICD-10-CM

## 2021-02-15 DIAGNOSIS — Z7984 Long term (current) use of oral hypoglycemic drugs: Secondary | ICD-10-CM

## 2021-02-15 DIAGNOSIS — Z20822 Contact with and (suspected) exposure to covid-19: Secondary | ICD-10-CM | POA: Diagnosis present

## 2021-02-15 DIAGNOSIS — I712 Thoracic aortic aneurysm, without rupture: Secondary | ICD-10-CM | POA: Diagnosis present

## 2021-02-15 DIAGNOSIS — E118 Type 2 diabetes mellitus with unspecified complications: Secondary | ICD-10-CM

## 2021-02-15 DIAGNOSIS — I739 Peripheral vascular disease, unspecified: Secondary | ICD-10-CM | POA: Diagnosis present

## 2021-02-15 DIAGNOSIS — I2542 Coronary artery dissection: Secondary | ICD-10-CM

## 2021-02-15 DIAGNOSIS — Z79899 Other long term (current) drug therapy: Secondary | ICD-10-CM

## 2021-02-15 DIAGNOSIS — I70203 Unspecified atherosclerosis of native arteries of extremities, bilateral legs: Secondary | ICD-10-CM | POA: Diagnosis present

## 2021-02-15 DIAGNOSIS — Z9862 Peripheral vascular angioplasty status: Secondary | ICD-10-CM

## 2021-02-15 DIAGNOSIS — E785 Hyperlipidemia, unspecified: Secondary | ICD-10-CM | POA: Diagnosis present

## 2021-02-15 DIAGNOSIS — N529 Male erectile dysfunction, unspecified: Secondary | ICD-10-CM

## 2021-02-15 DIAGNOSIS — I129 Hypertensive chronic kidney disease with stage 1 through stage 4 chronic kidney disease, or unspecified chronic kidney disease: Secondary | ICD-10-CM | POA: Diagnosis present

## 2021-02-15 DIAGNOSIS — Z7982 Long term (current) use of aspirin: Secondary | ICD-10-CM

## 2021-02-15 HISTORY — DX: Atherosclerotic heart disease of native coronary artery without angina pectoris: I25.10

## 2021-02-15 HISTORY — PX: CORONARY BALLOON ANGIOPLASTY: CATH118233

## 2021-02-15 HISTORY — DX: Angina pectoris, unspecified: I20.9

## 2021-02-15 HISTORY — PX: CORONARY ATHERECTOMY: CATH118238

## 2021-02-15 LAB — GLUCOSE, CAPILLARY
Glucose-Capillary: 193 mg/dL — ABNORMAL HIGH (ref 70–99)
Glucose-Capillary: 215 mg/dL — ABNORMAL HIGH (ref 70–99)

## 2021-02-15 LAB — CBC
HCT: 34.1 % — ABNORMAL LOW (ref 39.0–52.0)
Hemoglobin: 10.8 g/dL — ABNORMAL LOW (ref 13.0–17.0)
MCH: 29.8 pg (ref 26.0–34.0)
MCHC: 31.7 g/dL (ref 30.0–36.0)
MCV: 93.9 fL (ref 80.0–100.0)
Platelets: 224 10*3/uL (ref 150–400)
RBC: 3.63 MIL/uL — ABNORMAL LOW (ref 4.22–5.81)
RDW: 14.6 % (ref 11.5–15.5)
WBC: 3.4 10*3/uL — ABNORMAL LOW (ref 4.0–10.5)
nRBC: 0 % (ref 0.0–0.2)

## 2021-02-15 LAB — POCT ACTIVATED CLOTTING TIME
Activated Clotting Time: 219 seconds
Activated Clotting Time: 294 seconds
Activated Clotting Time: 138 seconds
Activated Clotting Time: 214 seconds
Activated Clotting Time: 126 seconds

## 2021-02-15 LAB — ECHOCARDIOGRAM COMPLETE
AR max vel: 2.19 cm2
AV Area VTI: 2.5 cm2
AV Area mean vel: 2.25 cm2
AV Mean grad: 8 mmHg
AV Peak grad: 16.8 mmHg
Ao pk vel: 2.05 m/s
Area-P 1/2: 3.85 cm2
Height: 70 in
S' Lateral: 3.2 cm
Weight: 3680 oz

## 2021-02-15 LAB — BASIC METABOLIC PANEL
Anion gap: 7 (ref 5–15)
BUN: 26 mg/dL — ABNORMAL HIGH (ref 8–23)
CO2: 27 mmol/L (ref 22–32)
Calcium: 9.1 mg/dL (ref 8.9–10.3)
Chloride: 106 mmol/L (ref 98–111)
Creatinine, Ser: 1.88 mg/dL — ABNORMAL HIGH (ref 0.61–1.24)
GFR, Estimated: 39 mL/min — ABNORMAL LOW (ref >60)
Glucose, Bld: 194 mg/dL — ABNORMAL HIGH (ref 70–99)
Potassium: 4.2 mmol/L (ref 3.5–5.1)
Sodium: 140 mmol/L (ref 135–145)

## 2021-02-15 LAB — ECHOCARDIOGRAM LIMITED
Height: 70 in
Weight: 3680 oz

## 2021-02-15 LAB — RESP PANEL BY RT-PCR (FLU A&B, COVID) ARPGX2
Influenza A by PCR: NEGATIVE
Influenza B by PCR: NEGATIVE
SARS Coronavirus 2 by RT PCR: NEGATIVE

## 2021-02-15 LAB — MRSA NEXT GEN BY PCR, NASAL: MRSA by PCR Next Gen: NOT DETECTED

## 2021-02-15 SURGERY — CORONARY ATHERECTOMY
Anesthesia: LOCAL

## 2021-02-15 MED ORDER — CARVEDILOL 25 MG PO TABS
25.0000 mg | ORAL_TABLET | Freq: Two times a day (BID) | ORAL | Status: DC
  Administered 2021-02-15 – 2021-02-17 (×4): 25 mg via ORAL
  Filled 2021-02-15 (×5): qty 1

## 2021-02-15 MED ORDER — ASPIRIN 81 MG PO CHEW: 81.0000 mg | CHEWABLE_TABLET | Freq: Every day | ORAL | Status: DC

## 2021-02-15 MED ORDER — CHLORHEXIDINE GLUCONATE CLOTH 2 % EX PADS
6.0000 | MEDICATED_PAD | Freq: Every day | CUTANEOUS | Status: DC
  Administered 2021-02-15 – 2021-02-16 (×2): 6 via TOPICAL

## 2021-02-15 MED ORDER — PROTAMINE SULFATE 10 MG/ML IV SOLN
INTRAVENOUS | Status: DC | PRN
  Administered 2021-02-15: 50 mg via INTRAVENOUS

## 2021-02-15 MED ORDER — LISINOPRIL-HYDROCHLOROTHIAZIDE 20-25 MG PO TABS: 1.0000 | ORAL_TABLET | Freq: Every day | ORAL | Status: DC

## 2021-02-15 MED ORDER — CLOPIDOGREL BISULFATE 75 MG PO TABS
75.0000 mg | ORAL_TABLET | Freq: Every day | ORAL | Status: DC
  Administered 2021-02-16 – 2021-02-17 (×2): 75 mg via ORAL
  Filled 2021-02-15 (×2): qty 1

## 2021-02-15 MED ORDER — HYDROCHLOROTHIAZIDE 25 MG PO TABS
25.0000 mg | ORAL_TABLET | Freq: Every day | ORAL | Status: DC
  Administered 2021-02-16 – 2021-02-17 (×2): 25 mg via ORAL
  Filled 2021-02-15 (×2): qty 1

## 2021-02-15 MED ORDER — INSULIN ASPART 100 UNIT/ML IJ SOLN
4.0000 [IU] | Freq: Once | INTRAMUSCULAR | Status: DC
  Administered 2021-02-15: 4 [IU] via SUBCUTANEOUS

## 2021-02-15 MED ORDER — ISOSORBIDE MONONITRATE ER 30 MG PO TB24
30.0000 mg | ORAL_TABLET | Freq: Every day | ORAL | Status: DC
  Administered 2021-02-16 – 2021-02-17 (×2): 30 mg via ORAL
  Filled 2021-02-15 (×2): qty 1

## 2021-02-15 MED ORDER — PANTOPRAZOLE SODIUM 40 MG PO TBEC
40.0000 mg | DELAYED_RELEASE_TABLET | Freq: Two times a day (BID) | ORAL | Status: DC
  Administered 2021-02-15 – 2021-02-17 (×4): 40 mg via ORAL
  Filled 2021-02-15 (×4): qty 1

## 2021-02-15 MED ORDER — AMLODIPINE BESYLATE 10 MG PO TABS
10.0000 mg | ORAL_TABLET | Freq: Every day | ORAL | Status: DC
  Administered 2021-02-15 – 2021-02-17 (×3): 10 mg via ORAL
  Filled 2021-02-15 (×3): qty 1

## 2021-02-15 MED ORDER — SODIUM CHLORIDE 0.9% FLUSH: 3.0000 mL | INTRAVENOUS | Status: DC | PRN

## 2021-02-15 MED ORDER — MIDAZOLAM HCL 2 MG/2ML IJ SOLN
INTRAMUSCULAR | Status: DC | PRN
  Administered 2021-02-15: 1 mg via INTRAVENOUS

## 2021-02-15 MED ORDER — VERAPAMIL HCL 2.5 MG/ML IV SOLN: INTRAVENOUS | Status: DC | PRN

## 2021-02-15 MED ORDER — IOHEXOL 350 MG/ML SOLN
INTRAVENOUS | Status: DC | PRN
  Administered 2021-02-15: 130 mL via INTRA_ARTERIAL

## 2021-02-15 MED ORDER — INSULIN ASPART 100 UNIT/ML IJ SOLN: 4.0000 [IU] | Freq: Once | INTRAMUSCULAR | Status: AC

## 2021-02-15 MED ORDER — SODIUM CHLORIDE 0.9 % IV SOLN: 250.0000 mL | INTRAVENOUS | Status: DC | PRN

## 2021-02-15 MED ORDER — INSULIN PEN NEEDLE 31G X 5 MM MISC: Freq: Every morning | Status: DC

## 2021-02-15 MED ORDER — HEPARIN (PORCINE) IN NACL 1000-0.9 UT/500ML-% IV SOLN
INTRAVENOUS | Status: DC | PRN
  Administered 2021-02-15 (×3): 500 mL

## 2021-02-15 MED ORDER — VERAPAMIL HCL 2.5 MG/ML IV SOLN
INTRAVENOUS | Status: AC
  Filled 2021-02-15: qty 2

## 2021-02-15 MED ORDER — HYDRALAZINE HCL 20 MG/ML IJ SOLN: 10.0000 mg | INTRAMUSCULAR | Status: AC | PRN

## 2021-02-15 MED ORDER — SODIUM CHLORIDE 0.9% FLUSH
3.0000 mL | Freq: Two times a day (BID) | INTRAVENOUS | Status: DC
  Administered 2021-02-15 – 2021-02-16 (×2): 3 mL via INTRAVENOUS

## 2021-02-15 MED ORDER — SODIUM CHLORIDE 0.9% FLUSH
3.0000 mL | Freq: Two times a day (BID) | INTRAVENOUS | Status: DC
  Administered 2021-02-16: 3 mL via INTRAVENOUS

## 2021-02-15 MED ORDER — CLOPIDOGREL BISULFATE 75 MG PO TABS: 75.0000 mg | ORAL_TABLET | Freq: Every day | ORAL | Status: DC

## 2021-02-15 MED ORDER — SODIUM CHLORIDE 0.9 % WEIGHT BASED INFUSION
3.0000 mL/kg/h | INTRAVENOUS | Status: DC
  Administered 2021-02-15: 3 mL/kg/h via INTRAVENOUS

## 2021-02-15 MED ORDER — ATORVASTATIN CALCIUM 40 MG PO TABS: 40.0000 mg | ORAL_TABLET | Freq: Every day | ORAL | Status: DC

## 2021-02-15 MED ORDER — HEPARIN SODIUM (PORCINE) 1000 UNIT/ML IJ SOLN
INTRAMUSCULAR | Status: DC | PRN
  Administered 2021-02-15: 4000 [IU] via INTRAVENOUS
  Administered 2021-02-15: 10000 [IU] via INTRAVENOUS

## 2021-02-15 MED ORDER — LISINOPRIL 20 MG PO TABS
20.0000 mg | ORAL_TABLET | Freq: Every day | ORAL | Status: DC
  Administered 2021-02-16 – 2021-02-17 (×2): 20 mg via ORAL
  Filled 2021-02-15 (×2): qty 1

## 2021-02-15 MED ORDER — VERAPAMIL HCL 2.5 MG/ML IV SOLN
INTRA_ARTERIAL | Status: DC | PRN
  Administered 2021-02-15: 5 mL via INTRA_ARTERIAL

## 2021-02-15 MED ORDER — PROTAMINE SULFATE 10 MG/ML IV SOLN
INTRAVENOUS | Status: AC
  Filled 2021-02-15: qty 5

## 2021-02-15 MED ORDER — ASPIRIN EC 81 MG PO TBEC
81.0000 mg | DELAYED_RELEASE_TABLET | Freq: Every day | ORAL | Status: DC
  Administered 2021-02-16 – 2021-02-17 (×2): 81 mg via ORAL
  Filled 2021-02-15 (×2): qty 1

## 2021-02-15 MED ORDER — FENOFIBRATE 160 MG PO TABS
160.0000 mg | ORAL_TABLET | Freq: Every day | ORAL | Status: DC
  Administered 2021-02-15 – 2021-02-17 (×3): 160 mg via ORAL
  Filled 2021-02-15 (×3): qty 1

## 2021-02-15 MED ORDER — MORPHINE SULFATE (PF) 2 MG/ML IV SOLN
2.0000 mg | INTRAVENOUS | Status: DC | PRN
  Administered 2021-02-15: 2 mg via INTRAVENOUS
  Filled 2021-02-15: qty 1

## 2021-02-15 MED ORDER — ONDANSETRON HCL 4 MG/2ML IJ SOLN: 4.0000 mg | Freq: Four times a day (QID) | INTRAMUSCULAR | Status: DC | PRN

## 2021-02-15 MED ORDER — HEPARIN SODIUM (PORCINE) 1000 UNIT/ML IJ SOLN
INTRAMUSCULAR | Status: AC
  Filled 2021-02-15: qty 1

## 2021-02-15 MED ORDER — FENTANYL CITRATE (PF) 100 MCG/2ML IJ SOLN
INTRAMUSCULAR | Status: DC | PRN
  Administered 2021-02-15: 25 ug
  Administered 2021-02-15 (×2): 25 ug via INTRAVENOUS

## 2021-02-15 MED ORDER — SODIUM CHLORIDE 0.9 % WEIGHT BASED INFUSION
1.0000 mL/kg/h | INTRAVENOUS | Status: DC
  Administered 2021-02-15: 1 mL/kg/h via INTRAVENOUS

## 2021-02-15 MED ORDER — NITROGLYCERIN 1 MG/10 ML FOR IR/CATH LAB
INTRA_ARTERIAL | Status: AC
  Filled 2021-02-15: qty 10

## 2021-02-15 MED ORDER — LABETALOL HCL 5 MG/ML IV SOLN: 10.0000 mg | INTRAVENOUS | Status: AC | PRN

## 2021-02-15 MED ORDER — LIDOCAINE HCL (PF) 1 % IJ SOLN
INTRAMUSCULAR | Status: AC
  Filled 2021-02-15: qty 30

## 2021-02-15 MED ORDER — ASPIRIN 81 MG PO CHEW: 81.0000 mg | CHEWABLE_TABLET | ORAL | Status: DC

## 2021-02-15 MED ORDER — INSULIN ASPART 100 UNIT/ML IJ SOLN
0.0000 [IU] | Freq: Three times a day (TID) | INTRAMUSCULAR | Status: DC
  Administered 2021-02-16: 3 [IU] via SUBCUTANEOUS
  Administered 2021-02-16: 5 [IU] via SUBCUTANEOUS
  Administered 2021-02-16: 2 [IU] via SUBCUTANEOUS
  Administered 2021-02-17 (×2): 3 [IU] via SUBCUTANEOUS

## 2021-02-15 MED ORDER — ATORVASTATIN CALCIUM 80 MG PO TABS
80.0000 mg | ORAL_TABLET | Freq: Every day | ORAL | Status: DC
  Administered 2021-02-15 – 2021-02-17 (×3): 80 mg via ORAL
  Filled 2021-02-15 (×3): qty 1

## 2021-02-15 MED ORDER — LIDOCAINE HCL (PF) 1 % IJ SOLN
INTRAMUSCULAR | Status: DC | PRN
  Administered 2021-02-15: 2 mL via SUBCUTANEOUS

## 2021-02-15 MED ORDER — HEPARIN (PORCINE) IN NACL 1000-0.9 UT/500ML-% IV SOLN
INTRAVENOUS | Status: AC
  Filled 2021-02-15: qty 1500

## 2021-02-15 MED ORDER — FENTANYL CITRATE (PF) 100 MCG/2ML IJ SOLN
INTRAMUSCULAR | Status: AC
  Filled 2021-02-15: qty 2

## 2021-02-15 MED ORDER — ACETAMINOPHEN 325 MG PO TABS: 650.0000 mg | ORAL_TABLET | ORAL | Status: DC | PRN

## 2021-02-15 MED ORDER — CLOPIDOGREL BISULFATE 75 MG PO TABS: 75.0000 mg | ORAL_TABLET | ORAL | Status: DC

## 2021-02-15 MED ORDER — SODIUM CHLORIDE 0.9 % IV SOLN
INTRAVENOUS | Status: DC | PRN
  Administered 2021-02-15: 10 mL/h via INTRAVENOUS

## 2021-02-15 MED ORDER — MIDAZOLAM HCL 2 MG/2ML IJ SOLN
INTRAMUSCULAR | Status: AC
  Filled 2021-02-15: qty 2

## 2021-02-15 MED ORDER — PERFLUTREN LIPID MICROSPHERE
1.0000 mL | INTRAVENOUS | Status: AC | PRN
  Administered 2021-02-15: 5 mL via INTRAVENOUS
  Filled 2021-02-15: qty 10

## 2021-02-15 MED ORDER — SODIUM CHLORIDE 0.9 % IV SOLN: INTRAVENOUS | Status: AC

## 2021-02-15 SURGICAL SUPPLY — 17 items
BALLN MINITREK OTW 1.5X20 (BALLOONS) ×2
BALLOON MINITREK OTW 1.5X20 (BALLOONS) ×1 IMPLANT
CATH TELEPORT (CATHETERS) ×2 IMPLANT
CATH VISTA GUIDE 6FR XBLAD3.5 (CATHETERS) ×2 IMPLANT
DEVICE RAD COMP TR BAND LRG (VASCULAR PRODUCTS) ×2 IMPLANT
ELECT DEFIB PAD ADLT CADENCE (PAD) ×2 IMPLANT
GLIDESHEATH SLEND A-KIT 6F 22G (SHEATH) ×2 IMPLANT
KIT ENCORE 26 ADVANTAGE (KITS) ×2 IMPLANT
KIT HEART LEFT (KITS) ×2 IMPLANT
LUBRICANT VIPERSLIDE CORONARY (MISCELLANEOUS) ×2 IMPLANT
PACK CARDIAC CATHETERIZATION (CUSTOM PROCEDURE TRAY) ×2 IMPLANT
TRANSDUCER W/STOPCOCK (MISCELLANEOUS) ×2 IMPLANT
TUBING CIL FLEX 10 FLL-RA (TUBING) ×2 IMPLANT
WIRE ASAHI FIELDER XT 300CM (WIRE) ×2 IMPLANT
WIRE ASAHI PROWATER 300CM (WIRE) ×2 IMPLANT
WIRE HI TORQ VERSACORE-J 145CM (WIRE) ×2 IMPLANT
WIRE VIPERWIRE COR FLEX .012 (WIRE) ×4 IMPLANT

## 2021-02-15 NOTE — Progress Notes (Signed)
  Echocardiogram 2D Echocardiogram has been performed.  Nneka Blanda  Lynnette Caffey 02/15/2021, 3:01 PM

## 2021-02-15 NOTE — Progress Notes (Signed)
  Echocardiogram 2D Echocardiogram limited for effusion check has been performed.  Darlina Sicilian M 02/15/2021, 12:20 PM

## 2021-02-15 NOTE — Interval H&P Note (Signed)
Cath Lab Visit (complete for each Cath Lab visit)  Clinical Evaluation Leading to the Procedure:   ACS: No.  Non-ACS:    Anginal Classification: CCS I  Anti-ischemic medical therapy: Maximal Therapy (2 or more classes of medications)  Non-Invasive Test Results: No non-invasive testing performed  Prior CABG: No previous CABG      History and Physical Interval Note:  02/15/2021 10:38 AM  Matthew Chen  has presented today for surgery, with the diagnosis of chest pain.  The various methods of treatment have been discussed with the patient and family. After consideration of risks, benefits and other options for treatment, the patient has consented to  Procedure(s): CORONARY ATHERECTOMY (N/A) as a surgical intervention.  The patient's history has been reviewed, patient examined, no change in status, stable for surgery.  I have reviewed the patient's chart and labs.  Questions were answered to the patient's satisfaction.     Quay Burow

## 2021-02-15 NOTE — Telephone Encounter (Signed)
Call placed to patient. LMTRC.  

## 2021-02-15 NOTE — Plan of Care (Signed)

## 2021-02-15 NOTE — Telephone Encounter (Signed)
-----   Message from Susy Frizzle, MD sent at 02/14/2021  8:33 PM EDT ----- Renal function has worsened and he is having a catheterization.  I think we need to stop metformin and replace with farxiga 10 mg a day.  This will help protect his kidneys and his heart  Metformin is not recommended where his creatinine is.

## 2021-02-16 ENCOUNTER — Ambulatory Visit (HOSPITAL_COMMUNITY): Payer: 59

## 2021-02-16 ENCOUNTER — Other Ambulatory Visit: Payer: Self-pay | Admitting: Family Medicine

## 2021-02-16 DIAGNOSIS — Z794 Long term (current) use of insulin: Secondary | ICD-10-CM | POA: Diagnosis not present

## 2021-02-16 DIAGNOSIS — N522 Drug-induced erectile dysfunction: Secondary | ICD-10-CM | POA: Diagnosis not present

## 2021-02-16 DIAGNOSIS — I129 Hypertensive chronic kidney disease with stage 1 through stage 4 chronic kidney disease, or unspecified chronic kidney disease: Secondary | ICD-10-CM | POA: Diagnosis not present

## 2021-02-16 DIAGNOSIS — Z79899 Other long term (current) drug therapy: Secondary | ICD-10-CM | POA: Diagnosis not present

## 2021-02-16 DIAGNOSIS — I25110 Atherosclerotic heart disease of native coronary artery with unstable angina pectoris: Secondary | ICD-10-CM

## 2021-02-16 DIAGNOSIS — E1151 Type 2 diabetes mellitus with diabetic peripheral angiopathy without gangrene: Secondary | ICD-10-CM | POA: Diagnosis not present

## 2021-02-16 DIAGNOSIS — Z880 Allergy status to penicillin: Secondary | ICD-10-CM | POA: Diagnosis not present

## 2021-02-16 DIAGNOSIS — I4891 Unspecified atrial fibrillation: Secondary | ICD-10-CM | POA: Diagnosis present

## 2021-02-16 DIAGNOSIS — Z9862 Peripheral vascular angioplasty status: Secondary | ICD-10-CM | POA: Diagnosis not present

## 2021-02-16 DIAGNOSIS — Z7984 Long term (current) use of oral hypoglycemic drugs: Secondary | ICD-10-CM | POA: Diagnosis not present

## 2021-02-16 DIAGNOSIS — I712 Thoracic aortic aneurysm, without rupture: Secondary | ICD-10-CM | POA: Diagnosis not present

## 2021-02-16 DIAGNOSIS — I700 Atherosclerosis of aorta: Secondary | ICD-10-CM | POA: Diagnosis not present

## 2021-02-16 DIAGNOSIS — Z87891 Personal history of nicotine dependence: Secondary | ICD-10-CM | POA: Diagnosis not present

## 2021-02-16 DIAGNOSIS — I9751 Accidental puncture and laceration of a circulatory system organ or structure during a circulatory system procedure: Secondary | ICD-10-CM | POA: Diagnosis present

## 2021-02-16 DIAGNOSIS — I25118 Atherosclerotic heart disease of native coronary artery with other forms of angina pectoris: Secondary | ICD-10-CM | POA: Diagnosis not present

## 2021-02-16 DIAGNOSIS — E785 Hyperlipidemia, unspecified: Secondary | ICD-10-CM | POA: Diagnosis not present

## 2021-02-16 DIAGNOSIS — I739 Peripheral vascular disease, unspecified: Secondary | ICD-10-CM | POA: Diagnosis not present

## 2021-02-16 DIAGNOSIS — Z7982 Long term (current) use of aspirin: Secondary | ICD-10-CM | POA: Diagnosis not present

## 2021-02-16 DIAGNOSIS — I480 Paroxysmal atrial fibrillation: Secondary | ICD-10-CM | POA: Diagnosis not present

## 2021-02-16 DIAGNOSIS — N183 Chronic kidney disease, stage 3 unspecified: Secondary | ICD-10-CM | POA: Diagnosis not present

## 2021-02-16 DIAGNOSIS — I350 Nonrheumatic aortic (valve) stenosis: Secondary | ICD-10-CM | POA: Diagnosis not present

## 2021-02-16 DIAGNOSIS — Z20822 Contact with and (suspected) exposure to covid-19: Secondary | ICD-10-CM | POA: Diagnosis not present

## 2021-02-16 DIAGNOSIS — I70203 Unspecified atherosclerosis of native arteries of extremities, bilateral legs: Secondary | ICD-10-CM | POA: Diagnosis not present

## 2021-02-16 DIAGNOSIS — I2542 Coronary artery dissection: Secondary | ICD-10-CM | POA: Diagnosis present

## 2021-02-16 LAB — ECHOCARDIOGRAM LIMITED
AV Mean grad: 8 mmHg
AV Peak grad: 16 mmHg
Ao pk vel: 2 m/s
Area-P 1/2: 2.83 cm2
Height: 70 in
S' Lateral: 3.1 cm
Weight: 3820.13 oz

## 2021-02-16 LAB — CBC
HCT: 31.2 % — ABNORMAL LOW (ref 39.0–52.0)
Hemoglobin: 10 g/dL — ABNORMAL LOW (ref 13.0–17.0)
MCH: 29.6 pg (ref 26.0–34.0)
MCHC: 32.1 g/dL (ref 30.0–36.0)
MCV: 92.3 fL (ref 80.0–100.0)
Platelets: 207 10*3/uL (ref 150–400)
RBC: 3.38 MIL/uL — ABNORMAL LOW (ref 4.22–5.81)
RDW: 14.6 % (ref 11.5–15.5)
WBC: 5.3 10*3/uL (ref 4.0–10.5)
nRBC: 0 % (ref 0.0–0.2)

## 2021-02-16 LAB — BASIC METABOLIC PANEL
Anion gap: 7 (ref 5–15)
BUN: 23 mg/dL (ref 8–23)
CO2: 26 mmol/L (ref 22–32)
Calcium: 8.7 mg/dL — ABNORMAL LOW (ref 8.9–10.3)
Chloride: 103 mmol/L (ref 98–111)
Creatinine, Ser: 1.79 mg/dL — ABNORMAL HIGH (ref 0.61–1.24)
GFR, Estimated: 42 mL/min — ABNORMAL LOW (ref >60)
Glucose, Bld: 173 mg/dL — ABNORMAL HIGH (ref 70–99)
Potassium: 4.4 mmol/L (ref 3.5–5.1)
Sodium: 136 mmol/L (ref 135–145)

## 2021-02-16 LAB — GLUCOSE, CAPILLARY
Glucose-Capillary: 137 mg/dL — ABNORMAL HIGH (ref 70–99)
Glucose-Capillary: 171 mg/dL — ABNORMAL HIGH (ref 70–99)
Glucose-Capillary: 211 mg/dL — ABNORMAL HIGH (ref 70–99)

## 2021-02-16 MED ORDER — OXYCODONE-ACETAMINOPHEN 5-325 MG PO TABS
1.0000 | ORAL_TABLET | Freq: Four times a day (QID) | ORAL | Status: DC | PRN
  Filled 2021-02-16: qty 2

## 2021-02-16 MED ORDER — DAPAGLIFLOZIN PROPANEDIOL 10 MG PO TABS: 10.0000 mg | ORAL_TABLET | Freq: Every day | ORAL | 1 refills | Status: DC

## 2021-02-16 NOTE — Progress Notes (Signed)
Progress Note  Patient Name: Matthew Chen Date of Encounter: 02/16/2021  [facility] Cardiologist: Gwenlyn Found  Subjective   Still with chest pain through the night. No dyspnea  Inpatient Medications    Scheduled Meds:  amLODipine  10 mg Oral Daily   aspirin EC  81 mg Oral Daily   atorvastatin  80 mg Oral Daily   carvedilol  25 mg Oral BID   Chlorhexidine Gluconate Cloth  6 each Topical Daily   clopidogrel  75 mg Oral Daily   fenofibrate  160 mg Oral Daily   lisinopril  20 mg Oral Daily   And   hydrochlorothiazide  25 mg Oral Daily   insulin aspart  0-15 Units Subcutaneous TID WC   isosorbide mononitrate  30 mg Oral Daily   pantoprazole  40 mg Oral BID   sodium chloride flush  3 mL Intravenous Q12H   sodium chloride flush  3 mL Intravenous Q12H   Continuous Infusions:  sodium chloride     PRN Meds: sodium chloride, acetaminophen, morphine injection, ondansetron (ZOFRAN) IV, sodium chloride flush   Vital Signs    Vitals:   02/16/21 0400 02/16/21 0500 02/16/21 0600 02/16/21 0700  BP: 114/63 114/64 130/71   Pulse: (!) 58 (!) 55 60 66  Resp: 15 14 19 19   Temp:      TempSrc:      SpO2: 91% 91% 91% 90%  Weight:   108.3 kg   Height:        Intake/Output Summary (Last 24 hours) at 02/16/2021 0723 Last data filed at 02/16/2021 0600 Gross per 24 hour  Intake 1305.34 ml  Output 1800 ml  Net -494.66 ml   Last 3 Weights 02/16/2021 02/15/2021 02/08/2021  Weight (lbs) 238 lb 12.1 oz 230 lb 230 lb  Weight (kg) 108.3 kg 104.327 kg 104.327 kg      Telemetry    Sinus, PVCs- Personally Reviewed  ECG    NSR, no ischemic changes - Personally Reviewed  Physical Exam   GEN: No acute distress.   Neck: No JVD Cardiac: RRR, no murmurs, rubs, or gallops.  Respiratory: Clear to auscultation bilaterally. GI: Soft, nontender, non-distended  MS: No edema; No deformity. Neuro:  Nonfocal  Psych: Normal affect   Labs    High Sensitivity Troponin:  No results for input(s):  TROPONINIHS in the last 720 hours.    Chemistry Recent Labs  Lab 02/13/21 0932 02/15/21 0606 02/16/21 0055  NA 140 140 136  K 4.6 4.2 4.4  CL 101 106 103  CO2 25 27 26   GLUCOSE 254* 194* 173*  BUN 22 26* 23  CREATININE 1.74* 1.88* 1.79*  CALCIUM 9.4 9.1 8.7*  GFRNONAA  --  39* 42*  ANIONGAP  --  7 7     Hematology Recent Labs  Lab 02/15/21 0606 02/16/21 0055  WBC 3.4* 5.3  RBC 3.63* 3.38*  HGB 10.8* 10.0*  HCT 34.1* 31.2*  MCV 93.9 92.3  MCH 29.8 29.6  MCHC 31.7 32.1  RDW 14.6 14.6  PLT 224 207    BNPNo results for input(s): BNP, PROBNP in the last 168 hours.   DDimer No results for input(s): DDIMER in the last 168 hours.   Radiology    CARDIAC CATHETERIZATION  Addendum Date: 02/15/2021    1st Diag lesion is 99% stenosed.  Matthew Chen is a 65 y.o. male  [id number] LOCATION:  FACILITY: [facility] PHYSICIAN: Quay Burow, M.D. [birth year] DATE OF PROCEDURE:  02/15/2021 DATE OF DISCHARGE:  CARDIAC CATHETERIZATION / PCI D1 History obtained from chart review.Matthew Chen is a 65 y.o.   mild to moderately overweight married Caucasian male father of 4 children, grandfather of a grandchildren was referred by Dr. Skeet Latch for evaluation and treatment of claudication. I last saw him in the office      01/17/2021 .He has a history of treated hypertension, diabetes and hyperlipidemia. 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 He's never had a heart attack or stroke and denies chest pain or shortness of breath. He does complain of lifestyle limiting claudication and had arterial Doppler studies performed in our office 05/29/15 revealing high-grade right popliteal and distal left SFA disease. Based on this, we decided to proceed with angiography and potential percutaneous revascularization. I initially angiogrammed him on 06/29/15 and performed diamondback orbital rotational atherectomy, drug eluting balloon angioplasty of the distal left SFA for a  high-grade, subocclusive calcific/exophytic plaque. He had a high-grade calcific/exophytic subocclusive right popliteal artery plaque for which he underwent diamondback orbital rotation arthrectomy, drug eluding balloon angioplasty on 07/13/15 with an excellent angiographic result. Since that time his claudication has resolved. His Dopplers performed  03/17/2018 revealed normal ABIs bilaterally with the development of a high-frequency signal in his right popliteal artery.  Since I saw him 8 months ago he has developed lifestyle limiting right calf claudication.   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  He had 30 to 40% mid left SFA stenosis.  I performed diamondback orbital rotational atherectomy, PTA and stenting using a Tigris 7 mm x 40 mm long self-expanding stent.  His Dopplers normalized and his claudication has resolved.    His most recent Dopplers performed 11/13/2020 revealed normal ABIs bilaterally with a patent right SFA stent.  When I saw him 3 weeks ago he was complaining of exertional chest pain and dyspnea over the last several months.  I did obtain a 2D echo that was unremarkable with an EF of 60 to 65% with mild MR and mild AAS.  He did have a 46 mm ascending thoracic aortic aneurysm.  Myoview stress test was nonischemic.  A chest CT did show a 7.1 cm mass in his liver and a MRI was suggested.  He is wearing a event monitor currently.  Based on his symptoms we decided to proceed with outpatient diagnostic coronary angiography. He had a diagnostic cath performed last week that showed a high-grade calcified ostial first diagonal branch stenosis.  Because of his renal insufficiency he was brought back today for planned diagonal branch CSI, PCI and stenting. PROCEDURE DESCRIPTION: The patient was brought to the second floor [facility] cath lab in the postabsorptive state. He was premedicated with IV Versed and  fentanyl. His right wrist was prepped and shaved in usual sterile fashion. Xylocaine 1% was used for local anesthesia. A 6 French sheath was inserted into the right radial  artery using standard Seldinger technique. The patient received 14,000 units  of heparin intravenously.  The peak ACT was 294 and after 50 mg of protamine it was 126.  A total of 130 cc of contrast was administered to the patient.  Using a 6 [place] XB LAD 3.5 cm guide catheter along with a FlexTip Viper wire followed by a Fielder XT 300 cm length wire I was able to cross the lesion after exchanging the Porter for [employer] through a [employer]  endhole catheter.  Patient was already on aspirin Plavix.  Prior to performing orbital atherectomy I did notice a small perforation in the proximal vessel and ultimately it was shown that the wire was "extraluminal".  The patient remained hemodynamically stable throughout the case.  I gave 50 mg of protamine reversing his ACT down to 126.  I then balloon tamponade the ostium of the diagonal branch and pulled back to the wire.  At the end of the case the perforation had sealed.  He remained hemodynamically stable.  Stat echo revealed no pericardial effusion. IMPRESSION: Unsuccessful planned CSI of high-grade calcified first diagonal branch because of vessel perforation with extravasation requiring emergent administration of protamine with ACT reversal and balloon tamponade of the ostium of the diagonal branch resulting in resolution of the perforation.  The patient did have a stat 2D echo which revealed no pericardial effusion.  He remained hemodynamically stable.  We will put him in Unit 2 H for observation overnight with anticipation for discharge tomorrow. I would be surprised if he had positive troponins and possibly evolves a diagonal branch infarct.  He left the lab in stable condition. Quay Burow. MD, [facility] 02/15/2021 12:57 PM   Result Date: 02/15/2021 Formatting of this result is different from the  original. Images from the original result were not included.  1st Diag lesion is 99% stenosed.  Matthew Chen is a 65 y.o. male  [id number] LOCATION:  FACILITY: [facility] PHYSICIAN: Quay Burow, M.D. [birth year] DATE OF PROCEDURE:  02/15/2021 DATE OF DISCHARGE: CARDIAC CATHETERIZATION / PCI D1 History obtained from chart review.Matthew Chen is a 65 y.o.   mild to moderately overweight married Caucasian male father of 4 children, grandfather of a grandchildren was referred by Dr. Skeet Latch for evaluation and treatment of claudication. I last saw him in the office      01/17/2021 .He has a history of treated hypertension, diabetes and hyperlipidemia. 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 He's never had a heart attack or stroke and denies chest pain or shortness of breath. He does complain of lifestyle limiting claudication and had arterial Doppler studies performed in our office 05/29/15 revealing high-grade right popliteal and distal left SFA disease. Based on this, we decided to proceed with angiography and potential percutaneous revascularization. I initially angiogrammed him on 06/29/15 and performed diamondback orbital rotational atherectomy, drug eluting balloon angioplasty of the distal left SFA for a high-grade, subocclusive calcific/exophytic plaque. He had a high-grade calcific/exophytic subocclusive right popliteal artery plaque for which he underwent diamondback orbital rotation arthrectomy, drug eluding balloon angioplasty on 07/13/15 with an excellent angiographic result. Since that time his claudication has resolved. His Dopplers performed  03/17/2018 revealed normal ABIs bilaterally with the development of a high-frequency signal in his right popliteal artery.  Since I saw him 8 months ago he has developed lifestyle limiting right calf claudication.   I performed angiography on him 04/27/2018 revealing a 95% calcified right popliteal artery stenosis with  three-vessel runoff.  He had 30 to 40% mid left SFA stenosis.  I performed diamondback orbital rotational atherectomy, PTA and stenting using a Tigris 7 mm x 40 mm long self-expanding stent.  His Dopplers normalized and his claudication has resolved.    His most recent Dopplers performed 11/13/2020 revealed normal ABIs bilaterally with a patent right SFA stent.  When I saw him 3 weeks ago he was complaining of exertional chest pain and dyspnea over the  last several months.  I did obtain a 2D echo that was unremarkable with an EF of 60 to 65% with mild MR and mild AAS.  He did have a 46 mm ascending thoracic aortic aneurysm.  Myoview stress test was nonischemic.  A chest CT did show a 7.1 cm mass in his liver and a MRI was suggested.  He is wearing a event monitor currently.  Based on his symptoms we decided to proceed with outpatient diagnostic coronary angiography. He had a diagnostic cath performed last week that showed a high-grade calcified ostial first diagonal branch stenosis.  Because of his renal insufficiency he was brought back today for planned diagonal branch CSI, PCI and stenting. PROCEDURE DESCRIPTION: The patient was brought to the second floor  Cardiac cath lab in the postabsorptive state. He was premedicated with IV Versed and fentanyl. His right wrist was prepped and shaved in usual sterile fashion. Xylocaine 1% was used for local anesthesia. A 6 French sheath was inserted into the right radial  artery using standard Seldinger technique. The patient received 14,000 units  of heparin intravenously.  The peak ACT was 294 and after 50 mg of protamine it was 126.  A total of 130 cc of contrast was administered to the patient.  Using a 6 [place] XB LAD 3.5 cm guide catheter along with a FlexTip Viper wire followed by a Fielder XT 300 cm length wire I was able to cross the lesion after exchanging the Grant for the Viper through a TelePort endhole catheter.  Patient was already on aspirin Plavix.   Prior to performing orbital atherectomy I did notice a small perforation in the proximal vessel and ultimately it was shown that the wire was "extraluminal".  The patient remained hemodynamically stable throughout the case.  I gave 50 mg of protamine reversing his ACT down to 126.  I then balloon tamponade the ostium of the diagonal branch and pulled back to the wire.  At the end of the case the perforation had sealed.  He remained hemodynamically stable.  Stat echo revealed no pericardial effusion.   Unsuccessful planned CSI of high-grade calcified first diagonal branch because of vessel perforation with extravasation requiring emergent administration of protamine with ACT reversal and balloon tamponade of the ostium of the diagonal branch resulting in resolution of the perforation.  The patient did have a stat 2D echo which revealed no pericardial effusion.  He remained hemodynamically stable.  We will put him in [place] for observation overnight with anticipation for discharge tomorrow.  He left the lab in stable condition. Quay Burow. MD, [facility] 02/15/2021 12:57 PM   ECHOCARDIOGRAM COMPLETE  Result Date: 02/15/2021    ECHOCARDIOGRAM REPORT   Patient Name:   Matthew Chen Date of Exam: 02/15/2021 Medical Rec #:  [id number]    Height:       70.0 in Accession #:    [id number]   Weight:       230.0 lb Date of Birth:  08-Mar-1956     BSA:          2.215 m Patient Age:    19 years     BP:           122/69 mmHg Patient Gender: M            HR:           69 bpm. Exam Location:  Inpatient Procedure: 2D Echo, Cardiac Doppler and Color Doppler Indications:    CAD  History:        Patient has prior history of Echocardiogram examinations, most                 recent 02/15/2021. Aortic Valve Disease; Risk Factors:Diabetes and                 Hypertension.  Sonographer:    Cammy Brochure Referring Phys: [place]  1. Left ventricular ejection fraction, by estimation, is 50 to 55%. The left ventricle  has low normal function. The left ventricle has no regional wall motion abnormalities. There is mild left ventricular hypertrophy. Left ventricular diastolic parameters are consistent with Grade II diastolic dysfunction (pseudonormalization).  2. Peak RV-RA gradient 21 mmHg. Right ventricular systolic function is normal. The right ventricular size is normal.  3. Left atrial size was moderately dilated.  4. Right atrial size was mildly dilated.  5. The mitral valve is normal in structure. Mild mitral valve regurgitation. No evidence of mitral stenosis.  6. The aortic valve is tricuspid. Aortic valve regurgitation is trivial. Mild aortic valve stenosis.  7. Aortic dilatation noted. There is mild dilatation of the aortic root, measuring 43 mm. FINDINGS  Left Ventricle: Left ventricular ejection fraction, by estimation, is 50 to 55%. The left ventricle has low normal function. The left ventricle has no regional wall motion abnormalities. The left ventricular internal cavity size was normal in size. There is mild left ventricular hypertrophy. Left ventricular diastolic parameters are consistent with Grade II diastolic dysfunction (pseudonormalization). Right Ventricle: Peak RV-RA gradient 21 mmHg. The right ventricular size is normal. No increase in right ventricular wall thickness. Right ventricular systolic function is normal. Left Atrium: Left atrial size was moderately dilated. Right Atrium: Right atrial size was mildly dilated. Pericardium: There is no evidence of pericardial effusion. Mitral Valve: The mitral valve is normal in structure. There is mild thickening of the mitral valve leaflet(s). Mild mitral annular calcification. Mild mitral valve regurgitation. No evidence of mitral valve stenosis. Tricuspid Valve: The tricuspid valve is normal in structure. Tricuspid valve regurgitation is trivial. Aortic Valve: The aortic valve is tricuspid. Aortic valve regurgitation is trivial. Mild aortic stenosis is present.  Aortic valve mean gradient measures 8.0 mmHg. Aortic valve peak gradient measures 16.8 mmHg. Aortic valve area, by VTI measures 2.50 cm. Pulmonic Valve: The pulmonic valve was normal in structure. Pulmonic valve regurgitation is trivial. Aorta: Aortic dilatation noted. There is mild dilatation of the aortic root, measuring 43 mm. Venous: The inferior vena cava was not well visualized. IAS/Shunts: No atrial level shunt detected by color flow Doppler.  LEFT VENTRICLE PLAX 2D LVIDd:         5.10 cm  Diastology LVIDs:         3.20 cm  LV e' medial:    8.27 cm/s LV PW:         1.10 cm  LV E/e' medial:  16.7 LV IVS:        1.40 cm  LV e' lateral:   6.64 cm/s LVOT diam:     2.30 cm  LV E/e' lateral: 20.8 LV SV:         114 LV SV Index:   51 LVOT Area:     4.15 cm  RIGHT VENTRICLE RV Basal diam:  4.00 cm RV S prime:     14.00 cm/s TAPSE (M-mode): 2.5 cm LEFT ATRIUM              Index  RIGHT ATRIUM           Index LA diam:        4.00 cm  1.81 cm/m  RA Area:     20.60 cm LA Vol (A2C):   111.0 ml 50.11 ml/m RA Volume:   59.00 ml  26.64 ml/m LA Vol (A4C):   101.0 ml 45.60 ml/m LA Biplane Vol: 110.0 ml 49.66 ml/m  AORTIC VALVE AV Area (Vmax):    2.19 cm AV Area (Vmean):   2.25 cm AV Area (VTI):     2.50 cm AV Vmax:           205.00 cm/s AV Vmean:          129.000 cm/s AV VTI:            0.455 m AV Peak Grad:      16.8 mmHg AV Mean Grad:      8.0 mmHg LVOT Vmax:         108.00 cm/s LVOT Vmean:        69.800 cm/s LVOT VTI:          0.274 m LVOT/AV VTI ratio: 0.60  AORTA Ao Root diam: 4.30 cm Ao Asc diam:  3.70 cm MITRAL VALVE                TRICUSPID VALVE MV Area (PHT): 3.85 cm     TR Peak grad:   21.0 mmHg MV Decel Time: 197 msec     TR Vmax:        229.00 cm/s MV E velocity: 138.00 cm/s MV A velocity: 59.80 cm/s   SHUNTS MV E/A ratio:  2.31         Systemic VTI:  0.27 m                             Systemic Diam: 2.30 cm Loralie Champagne MD Electronically signed by Loralie Champagne MD Signature Date/Time:  02/15/2021/5:21:59 PM    Final    ECHOCARDIOGRAM LIMITED  Result Date: 02/15/2021    ECHOCARDIOGRAM LIMITED REPORT   Patient Name:   Matthew Chen Date of Exam: 02/15/2021 Medical Rec #:  [id number]    Height:       70.0 in Accession #:    [id number]   Weight:       229.9 lb Date of Birth:  [birth year]     BSA:          2.215 m Patient Age:    56 years     BP:           114/60 mmHg Patient Gender: M            HR:           52 bpm. Exam Location:  Inpatient Procedure: Limited Echo STAT ECHO Indications:    CAD Native Vessel I25.10                 Pericardial effusion I31.3  History:        Patient has prior history of Echocardiogram examinations, most                 recent 01/19/2021. PAD, Aortic Valve Disease; Risk                 Factors:Hypertension, Diabetes, Dyslipidemia and Former Smoker.                 Thoracic aortic aneurysm. Chronic  kidney disease.  Sonographer:    Darlina Sicilian RDCS Sonographer#2:  Merrie Roof RDCS Referring Phys: [place]  1. Left ventricular ejection fraction, by estimation, is 55 to 60%. The left ventricle has normal function. The left ventricle has no regional wall motion abnormalities.  2. Right ventricular systolic function is normal. The right ventricular size is normal.  3. Limited echo. No doppler. No pericardial effusion. FINDINGS  Left Ventricle: Left ventricular ejection fraction, by estimation, is 55 to 60%. The left ventricle has normal function. The left ventricle has no regional wall motion abnormalities. Right Ventricle: The right ventricular size is normal. No increase in right ventricular wall thickness. Right ventricular systolic function is normal. Left Atrium: Left atrial size was normal in size. Right Atrium: Right atrial size was normal in size. Pericardium: There is no evidence of pericardial effusion. IAS/Shunts: No atrial level shunt detected by color flow Doppler. Loralie Champagne MD Electronically signed by Loralie Champagne MD Signature  Date/Time: 02/15/2021/3:47:54 PM    Final     Cardiac Studies   Cath as above, personally reviewed by me.   Patient Profile     65 y.o. male with history of HTN, HLD, prior tobacco abuse, PAD and CAD who was admitted post attempted PCI on 02/15/21. Procedure complicated by wire perforation.   Assessment & Plan    CAD with unstable angina: Pt admitted following attempted PCI of the Diagonal branch. The Diagonal branch was perforated with the wire. Atherectomy was not performed. The perforation was tamponaded with a balloon. Echo with no evidence of pericardial effusion. Flow was lost down the Diagonal branch following the attempted PCI. He has no chest pain this am. Echo is pending to exclude pericardial effusion although low suspicion for this. Will plan to continue his home medical therapy for CAD including ASA/Plavix/statin/beta blocker/Imdur.  Will plan for transfer to telemetry today with pain medications. Limited echo today. Monitor on telemetry 24 more hours.  Likely d/c home tomorrow.   For questions or updates, please contact [facility] Please consult [email or web address] for contact info under        Signed, Lauree Chandler, MD  02/16/2021, 7:23 AM

## 2021-02-16 NOTE — Progress Notes (Signed)
CARDIAC REHAB PHASE I   PRE:  Rate/Rhythm: 61 SR  BP:  Sitting: 141/67      SaO2: 92 2L   MODE:  Ambulation: 560 ft   POST:  Rate/Rhythm: 90 PVCs  BP:  Sitting: 128/65      SaO2: 91 RA in hall, 92 RA in room  Pt tolerated exercise fair and amb 560 ft. Pt felt chest tightness  before walking 4/10 and did not increase w/ amb. To recliner after walk w/ call bell in reach and discussed risk factors, nutrition, encouraged walking as tolerated, chest pain, NTG in case pt discharged on it. Pt demonstrated verbal understanding. Did not refer pt for CRPII failed PCI. If Cardiologist would like pt to attend and can refer for stable angina. 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   [place], [place], ACM-CEP 02/16/2021 8:54 AM

## 2021-02-16 NOTE — Progress Notes (Signed)
  Echocardiogram 2D Echocardiogram has been performed.  Matthew Chen 02/16/2021, 11:18 AM

## 2021-02-16 NOTE — Telephone Encounter (Signed)
Call placed to patient and patient made aware.  

## 2021-02-17 DIAGNOSIS — I10 Essential (primary) hypertension: Secondary | ICD-10-CM

## 2021-02-17 DIAGNOSIS — N529 Male erectile dysfunction, unspecified: Secondary | ICD-10-CM

## 2021-02-17 DIAGNOSIS — I480 Paroxysmal atrial fibrillation: Secondary | ICD-10-CM

## 2021-02-17 DIAGNOSIS — I2542 Coronary artery dissection: Secondary | ICD-10-CM

## 2021-02-17 DIAGNOSIS — N522 Drug-induced erectile dysfunction: Secondary | ICD-10-CM

## 2021-02-17 DIAGNOSIS — I739 Peripheral vascular disease, unspecified: Secondary | ICD-10-CM

## 2021-02-17 DIAGNOSIS — E785 Hyperlipidemia, unspecified: Secondary | ICD-10-CM

## 2021-02-17 LAB — GLUCOSE, CAPILLARY
Glucose-Capillary: 182 mg/dL — ABNORMAL HIGH (ref 70–99)
Glucose-Capillary: 183 mg/dL — ABNORMAL HIGH (ref 70–99)

## 2021-02-17 MED ORDER — AMLODIPINE BESYLATE 5 MG PO TABS: 5.0000 mg | ORAL_TABLET | Freq: Every day | ORAL | Status: DC

## 2021-02-17 MED ORDER — ATORVASTATIN CALCIUM 80 MG PO TABS: 80.0000 mg | ORAL_TABLET | Freq: Every day | ORAL | 2 refills | Status: DC

## 2021-02-17 MED ORDER — CARVEDILOL 12.5 MG PO TABS: 37.5000 mg | ORAL_TABLET | Freq: Two times a day (BID) | ORAL | 2 refills | Status: DC

## 2021-02-17 MED ORDER — CARVEDILOL 25 MG PO TABS: 37.5000 mg | ORAL_TABLET | Freq: Two times a day (BID) | ORAL | Status: DC

## 2021-02-17 MED ORDER — AMLODIPINE BESYLATE 5 MG PO TABS: 5.0000 mg | ORAL_TABLET | Freq: Every day | ORAL | 2 refills | Status: DC

## 2021-02-17 NOTE — Progress Notes (Signed)
   02/17/21 0003  Vitals  Temp 98.9 F (37.2 C)  Temp Source Oral  BP 100/72  MAP (mmHg) 83  BP Location Right Arm  BP Method Automatic  Patient Position (if appropriate) Sitting  Pulse Rate 83  Pulse Rate Source Monitor  Resp 19  Level of Consciousness  Level of Consciousness Alert  MEWS COLOR  MEWS Score Color Green  Oxygen Therapy  SpO2 95 %  O2 Device Room Air  Pain Assessment  Pain Scale 0-10  Pain Score 0  MEWS Score  MEWS Temp 0  MEWS Systolic 1  MEWS Pulse 0  MEWS RR 0  MEWS LOC 0  MEWS Score 1  Received pt from 2H, alert and oriented x 4,  denies any pain.

## 2021-02-17 NOTE — Discharge Summary (Signed)
Discharge Summary    Patient ID: Matthew Chen MRN: [id number]; DOB: Mar 05, 1956  Admit date: 02/15/2021 Discharge date: 02/17/2021  PCP:  Susy Frizzle, MD   [facility] Providers Cardiologist:  Dr. Quay Burow, MD     Discharge Diagnoses    Principal Problem:   Coronary artery dissection Active Problems:   Essential hypertension   Peripheral arterial disease (HCC)   Dyslipidemia, goal LDL below 70   Type 2 diabetes mellitus with complication, with long-term current use of insulin ([place])   CKD (chronic kidney disease), stage III ([place])   Coronary artery disease   CAD (coronary artery disease)   Erectile dysfunction  Diagnostic Studies/Procedures    LHC 02/15/21:  History obtained from chart review.Matthew Chen is a 65 y.o.   mild to moderately overweight married Caucasian male father of 4 children, grandfather of a grandchildren was referred by Dr. Skeet Latch for evaluation and treatment of claudication. I last saw him in the office      01/17/2021 .He has a history of treated hypertension, diabetes and hyperlipidemia. 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 He's never had a heart attack or stroke and denies chest pain or shortness of breath. He does complain of lifestyle limiting claudication and had arterial Doppler studies performed in our office 05/29/15 revealing high-grade right popliteal and distal left SFA disease. Based on this, we decided to proceed with angiography and potential percutaneous revascularization. I initially angiogrammed him on 06/29/15 and performed diamondback orbital rotational atherectomy, drug eluting balloon angioplasty of the distal left SFA for a high-grade, subocclusive calcific/exophytic plaque. He had a high-grade calcific/exophytic subocclusive right popliteal artery plaque for which he underwent diamondback orbital rotation arthrectomy, drug eluding balloon angioplasty on 07/13/15 with an excellent  angiographic result. Since that time his claudication has resolved. His Dopplers performed  03/17/2018 revealed normal ABIs bilaterally with the development of a high-frequency signal in his right popliteal artery.  Since I saw him 8 months ago he has developed lifestyle limiting right calf claudication.   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  He had 30 to 40% mid left SFA stenosis.  I performed diamondback orbital rotational atherectomy, PTA and stenting using a Tigris 7 mm x 40 mm long self-expanding stent.  His Dopplers normalized and his claudication has resolved.    His most recent Dopplers performed 11/13/2020 revealed normal ABIs bilaterally with a patent right SFA stent.  When I saw him 3 weeks ago he was complaining of exertional chest pain and dyspnea over the last several months.  I did obtain a 2D echo that was unremarkable with an EF of 60 to 65% with mild MR and mild AAS.  He did have a 46 mm ascending thoracic aortic aneurysm.  Myoview stress test was nonischemic.  A chest CT did show a 7.1 cm mass in his liver and a MRI was suggested.  He is wearing a event monitor currently.  Based on his symptoms we decided to proceed with outpatient diagnostic coronary angiography.   He had a diagnostic cath performed last week that showed a high-grade calcified ostial first diagonal branch stenosis.  Because of his renal insufficiency he was brought back today for planned diagonal branch CSI, PCI and stenting.     PROCEDURE DESCRIPTION:    The patient was brought to the second floor [place] [facility] cath lab in  the postabsorptive state. He was premedicated with IV Versed and fentanyl. His right wrist was prepped and shaved in usual sterile fashion. Xylocaine 1% was used for local anesthesia. A 6 French sheath was inserted into the right radial  artery using standard Seldinger technique. The patient received 14,000  units  of heparin intravenously.  The peak ACT was 294 and after 50 mg of protamine it was 126.  A total of 130 cc of contrast was administered to the patient.  Using a 6 [place] XB LAD 3.5 cm guide catheter along with a FlexTip Viper wire followed by a Fielder XT 300 cm length wire I was able to cross the lesion after exchanging the [place] for the Viper through a TelePort endhole catheter.  Patient was already on aspirin Plavix.  Prior to performing orbital atherectomy I did notice a small perforation in the proximal vessel and ultimately it was shown that the wire was "extraluminal".  The patient remained hemodynamically stable throughout the case.  I gave 50 mg of protamine reversing his ACT down to 126.  I then balloon tamponade the ostium of the diagonal branch and pulled back to the wire.  At the end of the case the perforation had sealed.  He remained hemodynamically stable.  Stat echo revealed no pericardial effusion.     IMPRESSION: Unsuccessful planned CSI of high-grade calcified first diagonal branch because of vessel perforation with extravasation requiring emergent administration of protamine with ACT reversal and balloon tamponade of the ostium of the diagonal branch resulting in resolution of the perforation.  The patient did have a stat 2D echo which revealed no pericardial effusion.  He remained hemodynamically stable.  We will put him in Unit 2 H for observation overnight with anticipation for discharge tomorrow. I would be surprised if he had positive troponins and possibly evolves a diagonal branch infarct.  He left the lab in stable condition. _____________   Echocardiogram 02/15/21:   1. Left ventricular ejection fraction, by estimation, is 50 to 55%. The  left ventricle has low normal function. The left ventricle has no regional  wall motion abnormalities. There is mild left ventricular hypertrophy.  Left ventricular diastolic  parameters are consistent with Grade II diastolic  dysfunction  (pseudonormalization).   2. Peak RV-RA gradient 21 mmHg. Right ventricular systolic function is  normal. The right ventricular size is normal.   3. Left atrial size was moderately dilated.   4. Right atrial size was mildly dilated.   5. The mitral valve is normal in structure. Mild mitral valve  regurgitation. No evidence of mitral stenosis.   6. The aortic valve is tricuspid. Aortic valve regurgitation is trivial.  Mild aortic valve stenosis.   7. Aortic dilatation noted. There is mild dilatation of the aortic root,  measuring 43 mm.    Limited echocardiogram 02/16/21:   1. No pericardial effusion   2. Left ventricular ejection fraction, by estimation, is 60 to 65%. The  left ventricle has normal function. The left ventricle has no regional  wall motion abnormalities. There is mild left ventricular hypertrophy.   3. Right ventricular systolic function is normal. The right ventricular  size is normal.   4. The mitral valve is normal in structure. Trivial mitral valve  regurgitation.   5. The aortic valve was not well visualized. There is moderate  calcification of the aortic valve. Aortic valve regurgitation is not  visualized. Mild aortic valve stenosis.   6. There is mild dilatation of the ascending aorta,  measuring 39 mm.   History of Present Illness     Matthew Chen is a 65 y.o. male with a hx of PVD s/p LSFA PTA 2016 followed by staged popliteal PTA>>now s/p DES to Rt popliteal artery 04/2018, bicuspid AOV with mild AS, DM2, CKD stage III, HTN, HLD and UC who was recently seen by [name] for follow up with complaints of chest pain at which time he underwent an echocardiogram that showed normal LVEF however also with a 46 mm ascending thoracic aortic aneurysm. Myoview stress test was nonischemic. A chest CT did show a 7.1 cm mass in his liver and a MRI was suggested. He is wearing a event monitor currently. Based on his symptoms plan was to proceed with outpatient  diagnostic coronary angiography.  Hospital Course     He presented for OP LHC/arthrectomy/intervention (diagnostic cath performed 02/08/21) performed 02/15/21. He had an unsuccessful CSI of high-grade calcified first diagonal branch because of vessel perforation with extravasation requiring emergent administration of protamine with ACT reversal and balloon tamponade of the ostium of the diagonal branch resulting in resolution of the perforation.  The patient did have a stat 2D echo which revealed no pericardial effusion.  He remained hemodynamically stable.   He has no chest pain or dyspnea at rest or walking in the room. He was noted to be in atrial fibrillation with rapid ventricular response but was completely unaware of the arrhythmia. He reported that he had atrial fibrillation at least one time before, when he was unable to get a gated CT due to arrhythmia.  He was unaware of the arrhythmia at that time as well. Repeat echocardiogram and limited echo with no pericardial effusion.   Hospital problems include:  CAD: Occluded diagonal s/p unsuccessful PCI with coronary perforation. Echocardiogram from 7/7 and 7/8 with no evidence of bleeding or pericardial effusion.  New onset AFib: Newly acknowledged abnormality, but per the patient's report this is occurred at least once before. Possibly due to epicardial inflammation after coronary perforation?  It appears much more likely that this is incidentally discovered chronic recurrent asymptomatic atrial fibrillation.  In the long run he will need direct oral anticoagulant. With a recent coronary perforation we will hold off from initiating this right now. Plan to discharge on aspirin plus clopidogrel and transition to Eliquis plus clopidogrel at his follow-up visit in less than 2 weeks.  increase Carvedilol increased for better rate control.   PAD: s/p PTA distal L SFA and R popliteal in 2016, repeat stent R popliteal 2019. Asymptomatic and normal ABI  April 2022. Has aortic atherosclerosis, but reported ascending aortic aneurysm not confirmed on CTA 01/30/2021.  HLD: LDL 75, close to goal. HDL low at 34. On max dose atorvastatin. Encourage weight loss.  HTN: Plan to decrease amlodipine to 5 mg daily to avoid hypotension with a higher dose of carvedilol.  ED: Sildenafil and isosorbide mononitrate are both on his list of medications.  He has not used the sildenafil in a couple of months. Discussed the risk of serious hemodynamic collapse with this combination per Dr. Sallyanne Kuster.  He would like to use sildenafil in the future therefore his isosorbide mononitrate was discontinued.   CKD Stage III: Baseline creatinine appears to be in the 1.4-1.6 range. Creatinine on day of discharge is 1.79. Plan to recheck at follow up appointment.  DM2: Continue home Dm medications    Consultants: None    The patient was seen and examined by Dr. Sallyanne Kuster who feels  that he is stable and ready for discharge today, 02/17/21.   Did the patient have an acute coronary syndrome (MI, NSTEMI, STEMI, etc) this admission?:  yes                              Did the patient have a percutaneous coronary intervention (stent / angioplasty)?:  No.    _____________  Discharge Vitals Blood pressure 104/81, pulse 73, temperature 98.4 F (36.9 C), temperature source Oral, resp. rate 19, height _0  (1.778 m), weight 106.5 kg, SpO2 97 %.  Filed Weights   02/15/21 0609 02/16/21 0600 02/17/21 0003  Weight: 104.3 kg 108.3 kg 106.5 kg    Labs & Radiologic Studies    CBC Recent Labs    02/15/21 0606 02/16/21 0055  WBC 3.4* 5.3  HGB 10.8* 10.0*  HCT 34.1* 31.2*  MCV 93.9 92.3  PLT 224 601   Basic Metabolic Panel Recent Labs    02/15/21 0606 02/16/21 0055  NA 140 136  K 4.2 4.4  CL 106 103  CO2 27 26  GLUCOSE 194* 173*  BUN 26* 23  CREATININE 1.88* 1.79*  CALCIUM 9.1 8.7*   Liver Function Tests No results for input(s): AST, ALT, ALKPHOS, BILITOT, PROT,  ALBUMIN in the last 72 hours. No results for input(s): LIPASE, AMYLASE in the last 72 hours. High Sensitivity Troponin:   No results for input(s): TROPONINIHS in the last 720 hours.  BNP Invalid input(s): POCBNP D-Dimer No results for input(s): DDIMER in the last 72 hours. Hemoglobin A1C No results for input(s): HGBA1C in the last 72 hours. Fasting Lipid Panel No results for input(s): CHOL, HDL, LDLCALC, TRIG, CHOLHDL, LDLDIRECT in the last 72 hours. Thyroid Function Tests No results for input(s): TSH, T4TOTAL, T3FREE, THYROIDAB in the last 72 hours.  Invalid input(s): FREET3 _____________  CARDIAC CATHETERIZATION  Addendum Date: 02/15/2021    1st Diag lesion is 99% stenosed.  Matthew Chen is a 65 y.o. male  [id number] LOCATION:  FACILITY: [facility] PHYSICIAN: Quay Burow, M.D. [birth year] DATE OF PROCEDURE:  02/15/2021 DATE OF DISCHARGE: CARDIAC CATHETERIZATION / PCI D1 History obtained from chart review.Matthew Chen is a 65 y.o.   mild to moderately overweight married Caucasian male father of 4 children, grandfather of a grandchildren was referred by Dr. Skeet Latch for evaluation and treatment of claudication. I last saw him in the office      01/17/2021 .He has a history of treated hypertension, diabetes and hyperlipidemia. 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 He's never had a heart attack or stroke and denies chest pain or shortness of breath. He does complain of lifestyle limiting claudication and had arterial Doppler studies performed in our office 05/29/15 revealing high-grade right popliteal and distal left SFA disease. Based on this, we decided to proceed with angiography and potential percutaneous revascularization. I initially angiogrammed him on 06/29/15 and performed diamondback orbital rotational atherectomy, drug eluting balloon angioplasty of the distal left SFA for a high-grade, subocclusive calcific/exophytic plaque. He had a high-grade  calcific/exophytic subocclusive right popliteal artery plaque for which he underwent diamondback orbital rotation arthrectomy, drug eluding balloon angioplasty on 07/13/15 with an excellent angiographic result. Since that time his claudication has resolved. His Dopplers performed  03/17/2018 revealed normal ABIs bilaterally with the development of a high-frequency signal in his right popliteal artery.  Since I saw him 8 months ago  he has developed lifestyle limiting right calf claudication.   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  He had 30 to 40% mid left SFA stenosis.  I performed diamondback orbital rotational atherectomy, PTA and stenting using a Tigris 7 mm x 40 mm long self-expanding stent.  His Dopplers normalized and his claudication has resolved.    His most recent Dopplers performed 11/13/2020 revealed normal ABIs bilaterally with a patent right SFA stent.  When I saw him 3 weeks ago he was complaining of exertional chest pain and dyspnea over the last several months.  I did obtain a 2D echo that was unremarkable with an EF of 60 to 65% with mild MR and mild AAS.  He did have a 46 mm ascending thoracic aortic aneurysm.  Myoview stress test was nonischemic.  A chest CT did show a 7.1 cm mass in his liver and a MRI was suggested.  He is wearing a event monitor currently.  Based on his symptoms we decided to proceed with outpatient diagnostic coronary angiography. He had a diagnostic cath performed last week that showed a high-grade calcified ostial first diagonal branch stenosis.  Because of his renal insufficiency he was brought back today for planned diagonal branch CSI, PCI and stenting. PROCEDURE DESCRIPTION: The patient was brought to the second floor [place] [facility] cath lab in the postabsorptive state. He was premedicated with IV Versed and fentanyl. His right wrist was prepped and shaved in usual sterile fashion.  Xylocaine 1% was used for local anesthesia. A 6 French sheath was inserted into the right radial  artery using standard Seldinger technique. The patient received 14,000 units  of heparin intravenously.  The peak ACT was 294 and after 50 mg of protamine it was 126.  A total of 130 cc of contrast was administered to the patient.  Using a 6 [place] XB LAD 3.5 cm guide catheter along with a FlexTip Viper wire followed by a Fielder XT 300 cm length wire I was able to cross the lesion after exchanging the [place] for the Viper through a TelePort endhole catheter.  Patient was already on aspirin Plavix.  Prior to performing orbital atherectomy I did notice a small perforation in the proximal vessel and ultimately it was shown that the wire was "extraluminal".  The patient remained hemodynamically stable throughout the case.  I gave 50 mg of protamine reversing his ACT down to 126.  I then balloon tamponade the ostium of the diagonal branch and pulled back to the wire.  At the end of the case the perforation had sealed.  He remained hemodynamically stable.  Stat echo revealed no pericardial effusion. IMPRESSION: Unsuccessful planned CSI of high-grade calcified first diagonal branch because of vessel perforation with extravasation requiring emergent administration of protamine with ACT reversal and balloon tamponade of the ostium of the diagonal branch resulting in resolution of the perforation.  The patient did have a stat 2D echo which revealed no pericardial effusion.  He remained hemodynamically stable.  We will put him in Unit 2 H for observation overnight with anticipation for discharge tomorrow. I would be surprised if he had positive troponins and possibly evolves a diagonal branch infarct.  He left the lab in stable condition. Quay Burow. MD, [facility] 02/15/2021 12:57 PM   Result Date: 02/15/2021 Formatting of this result is different from the original. Images from the original result were not included.  1st Diag  lesion is 99%  stenosed.  Matthew Chen is a 65 y.o. male  [id number] LOCATION:  FACILITY: [facility] PHYSICIAN: Quay Burow, M.D. 05-Aug-1956 DATE OF PROCEDURE:  02/15/2021 DATE OF DISCHARGE: CARDIAC CATHETERIZATION / PCI D1 History obtained from chart review.Matthew Chen is a 65 y.o.   mild to moderately overweight married Caucasian male father of 4 children, grandfather of a grandchildren was referred by Dr. Skeet Latch for evaluation and treatment of claudication. I last saw him in the office      01/17/2021 .He has a history of treated hypertension, diabetes and hyperlipidemia. 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 He's never had a heart attack or stroke and denies chest pain or shortness of breath. He does complain of lifestyle limiting claudication and had arterial Doppler studies performed in our office 05/29/15 revealing high-grade right popliteal and distal left SFA disease. Based on this, we decided to proceed with angiography and potential percutaneous revascularization. I initially angiogrammed him on 06/29/15 and performed diamondback orbital rotational atherectomy, drug eluting balloon angioplasty of the distal left SFA for a high-grade, subocclusive calcific/exophytic plaque. He had a high-grade calcific/exophytic subocclusive right popliteal artery plaque for which he underwent diamondback orbital rotation arthrectomy, drug eluding balloon angioplasty on 07/13/15 with an excellent angiographic result. Since that time his claudication has resolved. His Dopplers performed  03/17/2018 revealed normal ABIs bilaterally with the development of a high-frequency signal in his right popliteal artery.  Since I saw him 8 months ago he has developed lifestyle limiting right calf claudication.   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  He had 30 to 40% mid left SFA stenosis.  I performed  diamondback orbital rotational atherectomy, PTA and stenting using a Tigris 7 mm x 40 mm long self-expanding stent.  His Dopplers normalized and his claudication has resolved.    His most recent Dopplers performed 11/13/2020 revealed normal ABIs bilaterally with a patent right SFA stent.  When I saw him 3 weeks ago he was complaining of exertional chest pain and dyspnea over the last several months.  I did obtain a 2D echo that was unremarkable with an EF of 60 to 65% with mild MR and mild AAS.  He did have a 46 mm ascending thoracic aortic aneurysm.  Myoview stress test was nonischemic.  A chest CT did show a 7.1 cm mass in his liver and a MRI was suggested.  He is wearing a event monitor currently.  Based on his symptoms we decided to proceed with outpatient diagnostic coronary angiography. He had a diagnostic cath performed last week that showed a high-grade calcified ostial first diagonal branch stenosis.  Because of his renal insufficiency he was brought back today for planned diagonal branch CSI, PCI and stenting. PROCEDURE DESCRIPTION: The patient was brought to the second floor [place] [facility] lab in the postabsorptive state. He was premedicated with IV Versed and fentanyl. His right wrist was prepped and shaved in usual sterile fashion. Xylocaine 1% was used for local anesthesia. A 6 French sheath was inserted into the right radial  artery using standard Seldinger technique. The patient received 14,000 units  of heparin intravenously.  The peak ACT was 294 and after 50 mg of protamine it was 126.  A total of 130 cc of contrast was administered to the patient.  Using a 6 [place] XB LAD 3.5 cm guide catheter along with a FlexTip  Viper wire followed by a Fielder XT 300 cm length wire I was able to cross the lesion after exchanging the [place] for [employer] through a TelePort endhole catheter.  Patient was already on aspirin Plavix.  Prior to performing orbital atherectomy I did notice a small perforation  in the proximal vessel and ultimately it was shown that the wire was "extraluminal".  The patient remained hemodynamically stable throughout the case.  I gave 50 mg of protamine reversing his ACT down to 126.  I then balloon tamponade the ostium of the diagonal branch and pulled back to the wire.  At the end of the case the perforation had sealed.  He remained hemodynamically stable.  Stat echo revealed no pericardial effusion.   Unsuccessful planned CSI of high-grade calcified first diagonal branch because of vessel perforation with extravasation requiring emergent administration of protamine with ACT reversal and balloon tamponade of the ostium of the diagonal branch resulting in resolution of the perforation.  The patient did have a stat 2D echo which revealed no pericardial effusion.  He remained hemodynamically stable.  We will put him in [place] for observation overnight with anticipation for discharge tomorrow.  He left the lab in stable condition. Quay Burow. MD, [facility] 02/15/2021 12:57 PM   CARDIAC CATHETERIZATION  Result Date: 02/08/2021 Formatting of this result is different from the original. Images from the original result were not included.  1st Diag lesion is 99% stenosed.  Matthew Chen is a 65 y.o. male  [id number] LOCATION:  FACILITY: [facility] PHYSICIAN: Quay Burow, M.D. 1955-09-25 DATE OF PROCEDURE:  02/08/2021 DATE OF DISCHARGE: CARDIAC CATHETERIZATION History obtained from chart review.Matthew Chen is a 65 y.o.   mild to moderately overweight married Caucasian male father of 4 children, grandfather of a grandchildren was referred by Dr. Skeet Latch for evaluation and treatment of claudication. I last saw him in the office      01/17/2021 .He has a history of treated hypertension, diabetes and hyperlipidemia. 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 He's never had a heart attack or stroke and denies chest pain or shortness of breath. He does  complain of lifestyle limiting claudication and had arterial Doppler studies performed in our office 05/29/15 revealing high-grade right popliteal and distal left SFA disease. Based on this, we decided to proceed with angiography and potential percutaneous revascularization. I initially angiogrammed him on 06/29/15 and performed diamondback orbital rotational atherectomy, drug eluting balloon angioplasty of the distal left SFA for a high-grade, subocclusive calcific/exophytic plaque. He had a high-grade calcific/exophytic subocclusive right popliteal artery plaque for which he underwent diamondback orbital rotation arthrectomy, drug eluding balloon angioplasty on 07/13/15 with an excellent angiographic result. Since that time his claudication has resolved. His Dopplers performed  03/17/2018 revealed normal ABIs bilaterally with the development of a high-frequency signal in his right popliteal artery.  Since I saw him 8 months ago he has developed lifestyle limiting right calf claudication.   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  He had 30 to 40% mid left SFA stenosis.  I performed diamondback orbital rotational atherectomy, PTA and stenting using a Tigris 7 mm x 40 mm long self-expanding stent.  His Dopplers normalized and his claudication has resolved.    His most recent Dopplers performed 11/13/2020 revealed normal ABIs bilaterally with a patent right SFA stent.  When I saw him 3 weeks  ago he was complaining of exertional chest pain and dyspnea over the last several months.  I did obtain a 2D echo that was unremarkable with an EF of 60 to 65% with mild MR and mild AAS.  He did have a 46 mm ascending thoracic aortic aneurysm.  Myoview stress test was nonischemic.  A chest CT did show a 7.1 cm mass in his liver and a MRI was suggested.  He is wearing a event monitor currently.  Based on his symptoms we decided to proceed with  outpatient diagnostic coronary angiography.   Mr. 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  This is his "culprit lesion".  He has no other significant CAD although his vessels are diffusely fluoroscopically calcified.  He will need orbital atherectomy, PCI and drug-eluting stenting.  Given his moderate renal insufficiency with a serum creatinine of 1.87, max GFR of 120 cc and the fact that I have used 90 cc of contrast for his diagnostic case I have elected to defer his intervention.  I am going to start him on oral long-acting nitroglycerin preparation (Imdur 30 mg a day).  We will arrange for him to have intervention a week from today.  The sheath was removed and a TR band was placed on the right wrist to achieve patent hemostasis.  The patient left in stable condition.  I am also going to start p.o. Plavix. Quay Burow. MD, [facility] 02/08/2021 12:01 PM   CT ANGIO CHEST AORTA W/CM & OR WO/CM  Result Date: 01/31/2021 CLINICAL DATA:  Thoracic aortic aneurysm. EXAM: CT ANGIOGRAPHY CHEST WITH CONTRAST TECHNIQUE: Multidetector CT imaging of the chest was performed using the standard protocol during bolus administration of intravenous contrast. Multiplanar CT image reconstructions and MIPs were obtained to evaluate the vascular anatomy. CONTRAST:  58m OMNIPAQUE IOHEXOL 350 MG/ML SOLN COMPARISON:  None. FINDINGS: Cardiovascular: Atherosclerosis of the aortic arch without aneurysmal dilation of the thoracic aorta. Calcifications of the aortic valve. Three-vessel coronary artery calcifications. Normal size heart. No significant pericardial effusion/thickening. Mediastinum/Nodes: No discrete thyroid nodules. No pathologically enlarged mediastinal, hilar or axillary lymph nodes. Lungs/Pleura: No suspicious pulmonary nodules or masses. No focal consolidation. No pleural effusion. No pneumothorax. Upper Abdomen: Diffuse hepatic steatosis. Focal fatty sparing along the gallbladder  fossa. Enhancing 7.1 cm mass like area in segment V/Matthew on image 110/4 there is some widening of the fissures with enlargement of the caudate lobe, suggestive of early cirrhosis. Prominent gastrohepatic and periportal lymph nodes measuring up to 7 mm. Musculoskeletal: Multilevel degenerative changes spine. No acute osseous abnormality. Review of the MIP images confirms the above findings. IMPRESSION: 1. No evidence of thoracic aortic aneurysm. 2. Diffuse hepatic steatosis with findings suggestive of early cirrhosis. Enhancing 7.1 cm mass like area in segment V/Matthew of the liver, possibly representing focal fatty sparing but incompletely characterized on this study. Recommend further evaluation with liver protocol MRI with and without contrast. 3. Prominent gastrohepatic and periportal lymph nodes measuring up to 7 mm, nonspecific. 4. Aortic Atherosclerosis (ICD10-I70.0). These results will be called to the ordering clinician or representative by the Radiologist Assistant, and communication documented in the PACS or [employer] Electronically Signed   By: JDahlia BailiffMD   On: 01/31/2021 09:37   MYOCARDIAL PERFUSION IMAGING  Result Date: 01/30/2021  There was no ST segment deviation noted during stress.  The left ventricular ejection fraction is normal (55-65%).  Nuclear stress EF: 61%.  The study is normal.  This is a low risk  study.  1. Fixed basal to mid anterior perfusion defect with normal wall motion in this area, suggests artifact 2. Low risk study   ECHOCARDIOGRAM COMPLETE  Result Date: 02/15/2021    ECHOCARDIOGRAM REPORT   Patient Name:   Matthew Chen Date of Exam: 02/15/2021 Medical Rec #:  [id number]    Height:       70.0 in Accession #:    [id number]   Weight:       230.0 lb Date of Birth:  August 05, 1956     BSA:          2.215 m Patient Age:    81 years     BP:           122/69 mmHg Patient Gender: M            HR:           69 bpm. Exam Location:  Inpatient Procedure: 2D Echo, Cardiac Doppler  and Color Doppler Indications:    CAD  History:        Patient has prior history of Echocardiogram examinations, most                 recent 02/15/2021. Aortic Valve Disease; Risk Factors:Diabetes and                 Hypertension.  Sonographer:    Cammy Brochure Referring Phys: [place]  1. Left ventricular ejection fraction, by estimation, is 50 to 55%. The left ventricle has low normal function. The left ventricle has no regional wall motion abnormalities. There is mild left ventricular hypertrophy. Left ventricular diastolic parameters are consistent with Grade II diastolic dysfunction (pseudonormalization).  2. Peak RV-RA gradient 21 mmHg. Right ventricular systolic function is normal. The right ventricular size is normal.  3. Left atrial size was moderately dilated.  4. Right atrial size was mildly dilated.  5. The mitral valve is normal in structure. Mild mitral valve regurgitation. No evidence of mitral stenosis.  6. The aortic valve is tricuspid. Aortic valve regurgitation is trivial. Mild aortic valve stenosis.  7. Aortic dilatation noted. There is mild dilatation of the aortic root, measuring 43 mm. FINDINGS  Left Ventricle: Left ventricular ejection fraction, by estimation, is 50 to 55%. The left ventricle has low normal function. The left ventricle has no regional wall motion abnormalities. The left ventricular internal cavity size was normal in size. There is mild left ventricular hypertrophy. Left ventricular diastolic parameters are consistent with Grade II diastolic dysfunction (pseudonormalization). Right Ventricle: Peak RV-RA gradient 21 mmHg. The right ventricular size is normal. No increase in right ventricular wall thickness. Right ventricular systolic function is normal. Left Atrium: Left atrial size was moderately dilated. Right Atrium: Right atrial size was mildly dilated. Pericardium: There is no evidence of pericardial effusion. Mitral Valve: The mitral valve is  normal in structure. There is mild thickening of the mitral valve leaflet(s). Mild mitral annular calcification. Mild mitral valve regurgitation. No evidence of mitral valve stenosis. Tricuspid Valve: The tricuspid valve is normal in structure. Tricuspid valve regurgitation is trivial. Aortic Valve: The aortic valve is tricuspid. Aortic valve regurgitation is trivial. Mild aortic stenosis is present. Aortic valve mean gradient measures 8.0 mmHg. Aortic valve peak gradient measures 16.8 mmHg. Aortic valve area, by VTI measures 2.50 cm. Pulmonic Valve: The pulmonic valve was normal in structure. Pulmonic valve regurgitation is trivial. Aorta: Aortic dilatation noted. There is mild dilatation of the aortic root, measuring 43 mm.  Venous: The inferior vena cava was not well visualized. IAS/Shunts: No atrial level shunt detected by color flow Doppler.  LEFT VENTRICLE PLAX 2D LVIDd:         5.10 cm  Diastology LVIDs:         3.20 cm  LV e' medial:    8.27 cm/s LV PW:         1.10 cm  LV E/e' medial:  16.7 LV IVS:        1.40 cm  LV e' lateral:   6.64 cm/s LVOT diam:     2.30 cm  LV E/e' lateral: 20.8 LV SV:         114 LV SV Index:   51 LVOT Area:     4.15 cm  RIGHT VENTRICLE RV Basal diam:  4.00 cm RV S prime:     14.00 cm/s TAPSE (M-mode): 2.5 cm LEFT ATRIUM              Index       RIGHT ATRIUM           Index LA diam:        4.00 cm  1.81 cm/m  RA Area:     20.60 cm LA Vol (A2C):   111.0 ml 50.11 ml/m RA Volume:   59.00 ml  26.64 ml/m LA Vol (A4C):   101.0 ml 45.60 ml/m LA Biplane Vol: 110.0 ml 49.66 ml/m  AORTIC VALVE AV Area (Vmax):    2.19 cm AV Area (Vmean):   2.25 cm AV Area (VTI):     2.50 cm AV Vmax:           205.00 cm/s AV Vmean:          129.000 cm/s AV VTI:            0.455 m AV Peak Grad:      16.8 mmHg AV Mean Grad:      8.0 mmHg LVOT Vmax:         108.00 cm/s LVOT Vmean:        69.800 cm/s LVOT VTI:          0.274 m LVOT/AV VTI ratio: 0.60  AORTA Ao Root diam: 4.30 cm Ao Asc diam:  3.70 cm  MITRAL VALVE                TRICUSPID VALVE MV Area (PHT): 3.85 cm     TR Peak grad:   21.0 mmHg MV Decel Time: 197 msec     TR Vmax:        229.00 cm/s MV E velocity: 138.00 cm/s MV A velocity: 59.80 cm/s   SHUNTS MV E/A ratio:  2.31         Systemic VTI:  0.27 m                             Systemic Diam: 2.30 cm Loralie Champagne MD Electronically signed by Loralie Champagne MD Signature Date/Time: 02/15/2021/5:21:59 PM    Final    ECHOCARDIOGRAM COMPLETE  Result Date: 01/19/2021    ECHOCARDIOGRAM REPORT   Patient Name:   Matthew Chen  Date of Exam: 01/19/2021 Medical Rec #:  [id number]     Height:       70.0 in Accession #:    [id number]    Weight:       240.0 lb Date of Birth:  1955/10/31      BSA:  2.255 m Patient Age:    65 years      BP:           120/60 mmHg Patient Gender: M             HR:           66 bpm. Exam Location:  [place] Procedure: 2D Echo, 3D Echo, Cardiac Doppler and Color Doppler Indications:    Q23.1 Bicuspid aortic valve  History:        Patient has prior history of Echocardiogram examinations, most                 recent 05/15/2020. PAD, Aortic Valve Disease; Risk                 Factors:Hypertension, Diabetes, Dyslipidemia and Former Smoker.                 Thoracic aortic aneurysm. CKD stage 3.  Sonographer:    Jessee Avers, RDCS Referring Phys: [phone] Pearletha Forge BERRY  Sonographer Comments: Global longitudinal strain was attempted. IMPRESSIONS  1. Left ventricular ejection fraction, by estimation, is 60 to 65%. The left ventricle has normal function. The left ventricle has no regional wall motion abnormalities. There is mild asymmetric left ventricular hypertrophy of the basal-septal segment. Left ventricular diastolic parameters were normal.  2. Right ventricular systolic function is normal. The right ventricular size is normal. Tricuspid regurgitation signal is inadequate for assessing PA pressure.  3. The mitral valve is normal in structure. Mild mitral valve regurgitation. No  evidence of mitral stenosis.  4. The aortic valve is calcified. There is moderate calcification of the aortic valve. There is moderate thickening of the aortic valve. Aortic valve regurgitation is trivial. Mild aortic valve stenosis. Mean gradient 64mHg consistent with prior TTE in 2021.  5. Aortic dilatation noted. There is moderate dilatation of the aortic root, measuring 45 mm. There is moderate dilatation of the ascending aorta, measuring 46 mm.  6. Compared to prior TTE 05/2020, the ascending aorta and aortic root has increased in size (previously 467mfor both). Recommend CTA vs MRA for further evaluation. Comparison(s): 05/15/20 EF 60-65%. Mild AS 1088m mean PG, 35m67mpeak PG. Ascending aorta previously measured 41mm53m now measures 46mm;25mtic root previously measured 41mm a42mow measures 45mm. [id number]  Left Ventricle: Left ventricular ejection fraction, by estimation, is 60 to 65%. The left ventricle has normal function. The left ventricle has no regional wall motion abnormalities. 3D left ventricular ejection fraction analysis performed but not reported based on interpreter judgement due to suboptimal quality. The left ventricular internal cavity size was normal in size. There is mild asymmetric left ventricular hypertrophy of the basal-septal segment. Left ventricular diastolic parameters were  normal. Right Ventricle: The right ventricular size is normal. No increase in right ventricular wall thickness. Right ventricular systolic function is normal. Tricuspid regurgitation signal is inadequate for assessing PA pressure. Left Atrium: Left atrial size was normal in size. Right Atrium: Right atrial size was normal in size. Pericardium: There is no evidence of pericardial effusion. Mitral Valve: The mitral valve is normal in structure. There is mild thickening of the mitral valve leaflet(s). There is mild calcification of the mitral valve leaflet(s). Mild to moderate mitral annular calcification. Mild  mitral valve regurgitation. No  evidence of mitral valve stenosis. Tricuspid Valve: The tricuspid valve is normal in structure. Tricuspid valve regurgitation is trivial. Aortic Valve: The aortic valve is calcified. There is moderate calcification of  the aortic valve. There is moderate thickening of the aortic valve. Aortic valve regurgitation is trivial. Mild aortic stenosis is present. Aortic valve mean gradient measures 8.5 mmHg. Aortic valve peak gradient measures 18.1 mmHg. Aortic valve area, by VTI measures 2.32 cm. Pulmonic Valve: The pulmonic valve was normal in structure. Pulmonic valve regurgitation is trivial. Aorta: Aortic dilatation noted. There is moderate dilatation of the aortic root, measuring 45 mm. There is moderate dilatation of the ascending aorta, measuring 46 mm. IAS/Shunts: No atrial level shunt detected by color flow Doppler.  LEFT VENTRICLE PLAX 2D LVIDd:         4.60 cm  Diastology LVIDs:         3.00 cm  LV e' medial:    8.59 cm/s LV PW:         1.10 cm  LV E/e' medial:  15.7 LV IVS:        1.20 cm  LV e' lateral:   7.62 cm/s LVOT diam:     2.40 cm  LV E/e' lateral: 17.7 LV SV:         114 LV SV Index:   50 LVOT Area:     4.52 cm                          3D Volume EF:                         3D EF:        59 %                         LV EDV:       201 ml                         LV ESV:       82 ml                         LV SV:        119 ml RIGHT VENTRICLE RV Basal diam:  3.20 cm RV S prime:     11.90 cm/s TAPSE (M-mode): 2.5 cm LEFT ATRIUM             Index       RIGHT ATRIUM           Index LA diam:        4.20 cm 1.86 cm/m  RA Pressure: 3.00 mmHg LA Vol (A2C):   40.2 ml 17.82 ml/m RA Area:     15.10 cm LA Vol (A4C):   41.8 ml 18.53 ml/m RA Volume:   31.50 ml  13.97 ml/m LA Biplane Vol: 43.6 ml 19.33 ml/m  AORTIC VALVE AV Area (Vmax):    2.17 cm AV Area (Vmean):   2.12 cm AV Area (VTI):     2.32 cm AV Vmax:           213.00 cm/s AV Vmean:          136.000 cm/s AV VTI:             0.490 m AV Peak Grad:      18.1 mmHg AV Mean Grad:      8.5 mmHg LVOT Vmax:         102.00 cm/s LVOT Vmean:        63.800 cm/s  LVOT VTI:          0.251 m LVOT/AV VTI ratio: 0.51  AORTA Ao Root diam: 4.50 cm Ao Asc diam:  4.60 cm MITRAL VALVE                TRICUSPID VALVE                             Estimated RAP:  3.00 mmHg  MV E velocity: 135.00 cm/s  SHUNTS MV A velocity: 61.20 cm/s   Systemic VTI:  0.25 m MV E/A ratio:  2.21         Systemic Diam: 2.40 cm Gwyndolyn Kaufman MD Electronically signed by Gwyndolyn Kaufman MD Signature Date/Time: 01/19/2021/10:40:50 AM    Final    ECHOCARDIOGRAM LIMITED  Result Date: 02/16/2021    ECHOCARDIOGRAM LIMITED REPORT   Patient Name:   Matthew Chen Date of Exam: 02/16/2021 Medical Rec #:  [id number]    Height:       70.0 in Accession #:    [id number]   Weight:       238.8 lb Date of Birth:  01/01/1956     BSA:          2.251 m Patient Age:    57 years     BP:           138/80 mmHg Patient Gender: M            HR:           60 bpm. Exam Location:  Inpatient Procedure: Limited Echo, Color Doppler and Cardiac Doppler Indications:    pericardial effusion  History:        Patient has prior history of Echocardiogram examinations, most                 recent 02/15/2021. CAD, bicuspid aortic valve and Aortic Valve                 Disease, Signs/Symptoms:Chest Pain; Risk Factors:Diabetes,                 Hypertension and Dyslipidemia.  Sonographer:    Johny Chess Referring Phys: [phone] JONATHAN J BERRY  Sonographer Comments: Suboptimal subcostal window. IMPRESSIONS  1. No pericardial effusion  2. Left ventricular ejection fraction, by estimation, is 60 to 65%. The left ventricle has normal function. The left ventricle has no regional wall motion abnormalities. There is mild left ventricular hypertrophy.  3. Right ventricular systolic function is normal. The right ventricular size is normal.  4. The mitral valve is normal in structure. Trivial mitral valve regurgitation.  5. The  aortic valve was not well visualized. There is moderate calcification of the aortic valve. Aortic valve regurgitation is not visualized. Mild aortic valve stenosis.  6. There is mild dilatation of the ascending aorta, measuring 39 mm. FINDINGS  Left Ventricle: Left ventricular ejection fraction, by estimation, is 60 to 65%. The left ventricle has normal function. The left ventricle has no regional wall motion abnormalities. The left ventricular internal cavity size was normal in size. There is  mild left ventricular hypertrophy. Right Ventricle: The right ventricular size is normal. No increase in right ventricular wall thickness. Right ventricular systolic function is normal. Pericardium: There is no evidence of pericardial effusion. Mitral Valve: The mitral valve is normal in structure. Trivial mitral valve regurgitation. Tricuspid Valve: The tricuspid valve is normal in structure. Tricuspid valve regurgitation is trivial. Aortic Valve:  The aortic valve was not well visualized. There is moderate calcification of the aortic valve. Aortic valve regurgitation is not visualized. Mild aortic stenosis is present. Aortic valve mean gradient measures 8.0 mmHg. Aortic valve peak gradient measures 16.0 mmHg. Pulmonic Valve: The pulmonic valve was not well visualized. Pulmonic valve regurgitation is not visualized. Aorta: The aortic root, ascending aorta, aortic arch and descending aorta are all structurally normal, with no evidence of dilitation or obstruction. There is mild dilatation of the ascending aorta, measuring 39 mm. IAS/Shunts: The interatrial septum was not well visualized. LEFT VENTRICLE PLAX 2D LVIDd:         4.80 cm Diastology LVIDs:         3.10 cm LV e' medial:    8.27 cm/s LV PW:         1.20 cm LV E/e' medial:  13.5 LV IVS:        1.10 cm LV e' lateral:   8.92 cm/s                        LV E/e' lateral: 12.6  AORTIC VALVE AV Vmax:           200.00 cm/s AV Vmean:          130.000 cm/s AV VTI:             0.396 m AV Peak Grad:      16.0 mmHg AV Mean Grad:      8.0 mmHg LVOT Vmax:         126.00 cm/s LVOT Vmean:        83.300 cm/s LVOT VTI:          0.281 m LVOT/AV VTI ratio: 0.71  AORTA Ao Asc diam: 3.90 cm MITRAL VALVE MV Area (PHT): 2.83 cm     SHUNTS MV Decel Time: 268 msec     Systemic VTI: 0.28 m MV E velocity: 112.00 cm/s MV A velocity: 52.60 cm/s MV E/A ratio:  2.13 Oswaldo Milian MD Electronically signed by Oswaldo Milian MD Signature Date/Time: 02/16/2021/3:35:44 PM    Final    ECHOCARDIOGRAM LIMITED  Result Date: 02/15/2021    ECHOCARDIOGRAM LIMITED REPORT   Patient Name:   Matthew Chen Date of Exam: 02/15/2021 Medical Rec #:  [id number]    Height:       70.0 in Accession #:    [id number]   Weight:       229.9 lb Date of Birth:  [birth year]     BSA:          2.215 m Patient Age:    29 years     BP:           114/60 mmHg Patient Gender: M            HR:           52 bpm. Exam Location:  Inpatient Procedure: Limited Echo STAT ECHO Indications:    CAD Native Vessel I25.10                 Pericardial effusion I31.3  History:        Patient has prior history of Echocardiogram examinations, most                 recent 01/19/2021. PAD, Aortic Valve Disease; Risk                 Factors:Hypertension, Diabetes, Dyslipidemia and Former Smoker.  Thoracic aortic aneurysm. Chronic kidney disease.  Sonographer:    Darlina Sicilian RDCS Sonographer#2:  Merrie Roof RDCS Referring Phys: [place]  1. Left ventricular ejection fraction, by estimation, is 55 to 60%. The left ventricle has normal function. The left ventricle has no regional wall motion abnormalities.  2. Right ventricular systolic function is normal. The right ventricular size is normal.  3. Limited echo. No doppler. No pericardial effusion. FINDINGS  Left Ventricle: Left ventricular ejection fraction, by estimation, is 55 to 60%. The left ventricle has normal function. The left ventricle has no regional wall motion  abnormalities. Right Ventricle: The right ventricular size is normal. No increase in right ventricular wall thickness. Right ventricular systolic function is normal. Left Atrium: Left atrial size was normal in size. Right Atrium: Right atrial size was normal in size. Pericardium: There is no evidence of pericardial effusion. IAS/Shunts: No atrial level shunt detected by color flow Doppler. Loralie Champagne MD Electronically signed by Loralie Champagne MD Signature Date/Time: 02/15/2021/3:47:54 PM    Final    Disposition   Pt is being discharged home today in good condition.  Follow-up Plans & Appointments     Follow-up Information     Lorretta Harp, MD Follow up on 02/28/2021.   Specialties: Cardiology, Radiology Why: at [place] information: [address] [place] [address] [phone]                Discharge Instructions     Call MD for:  difficulty breathing, headache or visual disturbances   Complete by: As directed    Call MD for:  extreme fatigue   Complete by: As directed    Call MD for:  hives   Complete by: As directed    Call MD for:  persistant dizziness or light-headedness   Complete by: As directed    Call MD for:  persistant nausea and vomiting   Complete by: As directed    Call MD for:  redness, tenderness, or signs of infection (pain, swelling, redness, odor or green/yellow discharge around incision site)   Complete by: As directed    Call MD for:  severe uncontrolled pain   Complete by: As directed    Call MD for:  temperature >100.4   Complete by: As directed    Diet - low sodium heart healthy   Complete by: As directed    Discharge instructions   Complete by: As directed    No driving for 3-5 days. No lifting over 5 lbs for 1 week. No sexual activity for 1 week. Keep procedure site clean & dry. If you notice increased pain, swelling, bleeding or pus, call/return!  You may shower, but no soaking baths/hot tubs/pools for 1 week.    PLEASE DO NOT MISS ANY DOSES OF YOUR PLAVIX!!!!! Also keep a log of you blood pressures and bring back to your follow up appt. Please call the office with any questions.   Patients taking blood thinners should generally stay away from medicines like ibuprofen, Advil, Motrin, naproxen, and Aleve due to risk of stomach bleeding. You may take Tylenol as directed or talk to your primary doctor about alternatives.  PLEASE ENSURE THAT YOU DO NOT RUN OUT OF YOUR PLAVIX. IF you have issues obtaining this medication due to cost please CALL the office 3-5 business days prior to running out in order to prevent missing doses of this medication.   Increase activity slowly   Complete by: As directed  Remove dressing in 24 hours   Complete by: As directed        Discharge Medications   Allergies as of 02/17/2021       Reactions   Penicillins Other (See Comments)   Unsure, childhood reaction Has patient had a PCN reaction causing immediate rash, facial/tongue/throat swelling, SOB or lightheadedness with hypotension: Unknown Has patient had a PCN reaction causing severe rash involving mucus membranes or skin necrosis: Unknown Has patient had a PCN reaction that required hospitalization: No Has patient had a PCN reaction occurring within the last 10 years: No If all of the above answers are "NO", then may proceed with Cephalosporin use.        Medication List     STOP taking these medications    isosorbide mononitrate 30 MG 24 hr tablet Commonly known as: IMDUR       TAKE these medications    amLODipine 5 MG tablet Commonly known as: NORVASC Take 1 tablet (5 mg total) by mouth daily. Start taking on: February 18, 2021 What changed:  medication strength how much to take   aspirin EC 81 MG tablet Take 1 tablet (81 mg total) by mouth daily.   atorvastatin 80 MG tablet Commonly known as: LIPITOR Take 1 tablet (80 mg total) by mouth daily. Start taking on: February 18, 2021 What  changed:  medication strength how much to take   blood glucose meter kit and supplies Dispense based on patient and insurance preference. Check BS daily. 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 Needs strips and lancets #100 /5 refills   carvedilol 12.5 MG tablet Commonly known as: COREG Take 3 tablets (37.5 mg total) by mouth 2 (two) times daily. What changed:  medication strength how much to take   clopidogrel 75 MG tablet Commonly known as: Plavix Take 1 tablet (75 mg total) by mouth daily.   dapagliflozin propanediol 10 MG Tabs tablet Commonly known as: Farxiga Take 1 tablet (10 mg total) by mouth daily before breakfast.   Entyvio 300 MG injection Generic drug: vedolizumab Inject 300 mg as directed See admin instructions. Every 2 months   fenofibrate 160 MG tablet TAKE 1 TABLET BY MOUTH EVERY DAY   Fish Oil 1000 MG Caps Take 1,000 mg by mouth 2 (two) times daily.   Insulin Pen Needle 31G X 5 MM Misc Commonly known as: B-D UF III MINI PEN NEEDLES USE DAILY WITH VICTOZA   lisinopril-hydrochlorothiazide 20-25 MG tablet Commonly known as: ZESTORETIC TAKE 1 TABLET BY MOUTH EVERY DAY   OneTouch Ultra test strip Generic drug: glucose blood CHECK BLOOD SUGAR TWICE A DAY   pantoprazole 40 MG tablet Commonly known as: PROTONIX Take 1 tablet (40 mg total) by mouth 2 (two) times daily.   pioglitazone 30 MG tablet Commonly known as: ACTOS TAKE 1 TABLET BY MOUTH EVERY DAY STOP TRADJENTA   sildenafil 100 MG tablet Commonly known as: Viagra Take 0.5-1 tablets (50-100 mg total) by mouth daily as needed for erectile dysfunction.   Victoza 18 MG/3ML Sopn Generic drug: liraglutide INJECT 1.2 MG UNDER THE SKIN ONCE DAILY        Outstanding Labs/Studies   BMET   Duration of Discharge Encounter   Greater than 30 minutes including physician time.  Signed, Kathyrn Drown, NP 02/17/2021, 12:20 PM

## 2021-02-17 NOTE — Progress Notes (Signed)
Progress Note  Patient Name: Matthew Chen Date of Encounter: 02/17/2021  [facility] Cardiologist:  Gwenlyn Found  Subjective   He denies any chest discomfort or dyspnea at rest or walking in the room. He is in atrial fibrillation with rapid ventricular response but is completely unaware of the arrhythmia. He reports that he had atrial fibrillation at least one time before, when he was unable to get a gated CT due to arrhythmia.  He was unaware of the arrhythmia at that time as well.  Inpatient Medications    Scheduled Meds:  amLODipine  10 mg Oral Daily   aspirin EC  81 mg Oral Daily   atorvastatin  80 mg Oral Daily   carvedilol  25 mg Oral BID   Chlorhexidine Gluconate Cloth  6 each Topical Daily   clopidogrel  75 mg Oral Daily   fenofibrate  160 mg Oral Daily   lisinopril  20 mg Oral Daily   And   hydrochlorothiazide  25 mg Oral Daily   insulin aspart  0-15 Units Subcutaneous TID WC   isosorbide mononitrate  30 mg Oral Daily   pantoprazole  40 mg Oral BID   sodium chloride flush  3 mL Intravenous Q12H   sodium chloride flush  3 mL Intravenous Q12H   Continuous Infusions:  sodium chloride     PRN Meds: sodium chloride, acetaminophen, morphine injection, ondansetron (ZOFRAN) IV, oxyCODONE-acetaminophen, sodium chloride flush   Vital Signs    Vitals:   02/16/21 2300 02/17/21 0003 02/17/21 0517 02/17/21 0809  BP: 112/71 100/72 102/74 111/73  Pulse: 86 83 90 62  Resp: 16 19 20 20   Temp:  98.9 F (37.2 C) 98.8 F (37.1 C) 97.9 F (36.6 C)  TempSrc:  Oral Oral Oral  SpO2: 95% 95% 95% 96%  Weight:  106.5 kg    Height:  5' 10"  (1.778 m)      Intake/Output Summary (Last 24 hours) at 02/17/2021 1042 Last data filed at 02/17/2021 0819 Gross per 24 hour  Intake 598 ml  Output 1200 ml  Net -602 ml   Last 3 Weights 02/17/2021 02/16/2021 02/15/2021  Weight (lbs) 234 lb 14.4 oz 238 lb 12.1 oz 230 lb  Weight (kg) 106.55 kg 108.3 kg 104.327 kg      Telemetry    atrial  fibrillation with borderline rate control (average 100, range 90-120) - Personally Reviewed  ECG    atrial fibrillation with controlled rate - Personally Reviewed  Physical Exam  Appears well.  Well-healed right radial catheterization access GEN: No acute distress.   Neck: No JVD Cardiac: Irregular, no murmurs, rubs, or gallops.  Respiratory: Clear to auscultation bilaterally. GI: Soft, nontender, non-distended  MS: No edema; No deformity. Neuro:  Nonfocal  Psych: Normal affect   Labs    High Sensitivity Troponin:  No results for input(s): TROPONINIHS in the last 720 hours.    Chemistry Recent Labs  Lab 02/13/21 0932 02/15/21 0606 02/16/21 0055  NA 140 140 136  K 4.6 4.2 4.4  CL 101 106 103  CO2 25 27 26   GLUCOSE 254* 194* 173*  BUN 22 26* 23  CREATININE 1.74* 1.88* 1.79*  CALCIUM 9.4 9.1 8.7*  GFRNONAA  --  39* 42*  ANIONGAP  --  7 7     Hematology Recent Labs  Lab 02/15/21 0606 02/16/21 0055  WBC 3.4* 5.3  RBC 3.63* 3.38*  HGB 10.8* 10.0*  HCT 34.1* 31.2*  MCV 93.9 92.3  MCH 29.8 29.6  MCHC 31.7 32.1  RDW 14.6 14.6  PLT 224 207    BNPNo results for input(s): BNP, PROBNP in the last 168 hours.   DDimer No results for input(s): DDIMER in the last 168 hours.   Radiology    CARDIAC CATHETERIZATION  Addendum Date: 02/15/2021    1st Diag lesion is 99% stenosed.  CLAUDY ABDALLAH is a 65 y.o. male  [id number] LOCATION:  FACILITY: [place] PHYSICIAN: Quay Burow, M.D. 03/03/1956 DATE OF PROCEDURE:  02/15/2021 DATE OF DISCHARGE: CARDIAC CATHETERIZATION / PCI D1 History obtained from chart review.MAHMOUD BLAZEJEWSKI is a 65 y.o.   mild to moderately overweight married Caucasian male father of 4 children, grandfather of a grandchildren was referred by Dr. Skeet Latch for evaluation and treatment of claudication. I last saw him in the office      01/17/2021 .He has a history of treated hypertension, diabetes and hyperlipidemia. He is a short distance Administrator. He smoked 25  pack years and quit 17 years ago. He's never had a heart attack or stroke and denies chest pain or shortness of breath. He does complain of lifestyle limiting claudication and had arterial Doppler studies performed in our office 05/29/15 revealing high-grade right popliteal and distal left SFA disease. Based on this, we decided to proceed with angiography and potential percutaneous revascularization. I initially angiogrammed him on 06/29/15 and performed diamondback orbital rotational atherectomy, drug eluting balloon angioplasty of the distal left SFA for a high-grade, subocclusive calcific/exophytic plaque. He had a high-grade calcific/exophytic subocclusive right popliteal artery plaque for which he underwent diamondback orbital rotation arthrectomy, drug eluding balloon angioplasty on 07/13/15 with an excellent angiographic result. Since that time his claudication has resolved. His Dopplers performed  03/17/2018 revealed normal ABIs bilaterally with the development of a high-frequency signal in his right popliteal artery.  Since I saw him 8 months ago he has developed lifestyle limiting right calf claudication.   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  He had 30 to 40% mid left SFA stenosis.  I performed diamondback orbital rotational atherectomy, PTA and stenting using a Tigris 7 mm x 40 mm long self-expanding stent.  His Dopplers normalized and his claudication has resolved.    His most recent Dopplers performed 11/13/2020 revealed normal ABIs bilaterally with a patent right SFA stent.  When I saw him 3 weeks ago he was complaining of exertional chest pain and dyspnea over the last several months.  I did obtain a 2D echo that was unremarkable with an EF of 60 to 65% with mild MR and mild AAS.  He did have a 46 mm ascending thoracic aortic aneurysm.  Myoview stress test was nonischemic.  A chest CT did show a 7.1 cm mass in his  liver and a MRI was suggested.  He is wearing a event monitor currently.  Based on his symptoms we decided to proceed with outpatient diagnostic coronary angiography. He had a diagnostic cath performed last week that showed a high-grade calcified ostial first diagonal branch stenosis.  Because of his renal insufficiency he was brought back today for planned diagonal branch CSI, PCI and stenting. PROCEDURE DESCRIPTION: The patient was brought to the second floor [facility] cath lab in the postabsorptive state. He was premedicated with IV Versed and fentanyl. His right wrist was prepped and shaved in usual sterile fashion. Xylocaine 1% was used for local anesthesia. A 6 French sheath was inserted into the right  radial  artery using standard Seldinger technique. The patient received 14,000 units  of heparin intravenously.  The peak ACT was 294 and after 50 mg of protamine it was 126.  A total of 130 cc of contrast was administered to the patient.  Using a 6 [place] XB LAD 3.5 cm guide catheter along with a FlexTip Viper wire followed by a Fielder XT 300 cm length wire I was able to cross the lesion after exchanging the [place] for the Viper through a TelePort endhole catheter.  Patient was already on aspirin Plavix.  Prior to performing orbital atherectomy I did notice a small perforation in the proximal vessel and ultimately it was shown that the wire was "extraluminal".  The patient remained hemodynamically stable throughout the case.  I gave 50 mg of protamine reversing his ACT down to 126.  I then balloon tamponade the ostium of the diagonal branch and pulled back to the wire.  At the end of the case the perforation had sealed.  He remained hemodynamically stable.  Stat echo revealed no pericardial effusion. IMPRESSION: Unsuccessful planned CSI of high-grade calcified first diagonal branch because of vessel perforation with extravasation requiring emergent administration of protamine with ACT reversal and  balloon tamponade of the ostium of the diagonal branch resulting in resolution of the perforation.  The patient did have a stat 2D echo which revealed no pericardial effusion.  He remained hemodynamically stable.  We will put him in Unit 2 H for observation overnight with anticipation for discharge tomorrow. I would be surprised if he had positive troponins and possibly evolves a diagonal branch infarct.  He left the lab in stable condition. Quay Burow. MD, [facility] 02/15/2021 12:57 PM   Result Date: 02/15/2021 Formatting of this result is different from the original. Images from the original result were not included.  1st Diag lesion is 99% stenosed.  EMILLIO NGO is a 65 y.o. male  [id number] LOCATION:  FACILITY: [place] PHYSICIAN: Quay Burow, M.D. 1956-05-03 DATE OF PROCEDURE:  02/15/2021 DATE OF DISCHARGE: CARDIAC CATHETERIZATION / PCI D1 History obtained from chart review.SEYDINA HOLLIMAN is a 65 y.o.   mild to moderately overweight married Caucasian male father of 4 children, grandfather of a grandchildren was referred by Dr. Skeet Latch for evaluation and treatment of claudication. I last saw him in the office      01/17/2021 .He has a history of treated hypertension, diabetes and hyperlipidemia. 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 He's never had a heart attack or stroke and denies chest pain or shortness of breath. He does complain of lifestyle limiting claudication and had arterial Doppler studies performed in our office 05/29/15 revealing high-grade right popliteal and distal left SFA disease. Based on this, we decided to proceed with angiography and potential percutaneous revascularization. I initially angiogrammed him on 06/29/15 and performed diamondback orbital rotational atherectomy, drug eluting balloon angioplasty of the distal left SFA for a high-grade, subocclusive calcific/exophytic plaque. He had a high-grade calcific/exophytic subocclusive right  popliteal artery plaque for which he underwent diamondback orbital rotation arthrectomy, drug eluding balloon angioplasty on 07/13/15 with an excellent angiographic result. Since that time his claudication has resolved. His Dopplers performed  03/17/2018 revealed normal ABIs bilaterally with the development of a high-frequency signal in his right popliteal artery.  Since I saw him 8 months ago he has developed lifestyle limiting right calf claudication.   I performed angiography on him 04/27/2018 revealing a  95% calcified right popliteal artery stenosis with three-vessel runoff.  He had 30 to 40% mid left SFA stenosis.  I performed diamondback orbital rotational atherectomy, PTA and stenting using a Tigris 7 mm x 40 mm long self-expanding stent.  His Dopplers normalized and his claudication has resolved.    His most recent Dopplers performed 11/13/2020 revealed normal ABIs bilaterally with a patent right SFA stent.  When I saw him 3 weeks ago he was complaining of exertional chest pain and dyspnea over the last several months.  I did obtain a 2D echo that was unremarkable with an EF of 60 to 65% with mild MR and mild AAS.  He did have a 46 mm ascending thoracic aortic aneurysm.  Myoview stress test was nonischemic.  A chest CT did show a 7.1 cm mass in his liver and a MRI was suggested.  He is wearing a event monitor currently.  Based on his symptoms we decided to proceed with outpatient diagnostic coronary angiography. He had a diagnostic cath performed last week that showed a high-grade calcified ostial first diagonal branch stenosis.  Because of his renal insufficiency he was brought back today for planned diagonal branch CSI, PCI and stenting. PROCEDURE DESCRIPTION: The patient was brought to the second floor [place] [facility] cath lab in the postabsorptive state. He was premedicated with IV Versed and fentanyl. His right wrist was prepped and shaved in usual sterile fashion. Xylocaine 1% was used for local  anesthesia. A 6 French sheath was inserted into the right radial  artery using standard Seldinger technique. The patient received 14,000 units  of heparin intravenously.  The peak ACT was 294 and after 50 mg of protamine it was 126.  A total of 130 cc of contrast was administered to the patient.  Using a 6 [place] XB LAD 3.5 cm guide catheter along with a FlexTip Viper wire followed by a Fielder XT 300 cm length wire I was able to cross the lesion after exchanging the [place] for the Viper through a TelePort endhole catheter.  Patient was already on aspirin Plavix.  Prior to performing orbital atherectomy I did notice a small perforation in the proximal vessel and ultimately it was shown that the wire was "extraluminal".  The patient remained hemodynamically stable throughout the case.  I gave 50 mg of protamine reversing his ACT down to 126.  I then balloon tamponade the ostium of the diagonal branch and pulled back to the wire.  At the end of the case the perforation had sealed.  He remained hemodynamically stable.  Stat echo revealed no pericardial effusion.   Unsuccessful planned CSI of high-grade calcified first diagonal branch because of vessel perforation with extravasation requiring emergent administration of protamine with ACT reversal and balloon tamponade of the ostium of the diagonal branch resulting in resolution of the perforation.  The patient did have a stat 2D echo which revealed no pericardial effusion.  He remained hemodynamically stable.  We will put him in [place] for observation overnight with anticipation for discharge tomorrow.  He left the lab in stable condition. Quay Burow. MD, [facility] 02/15/2021 12:57 PM   ECHOCARDIOGRAM COMPLETE  Result Date: 02/15/2021    ECHOCARDIOGRAM REPORT   Patient Name:   LASHAUN KRAPF Date of Exam: 02/15/2021 Medical Rec #:  [id number]    Height:       70.0 in Accession #:    [id number]   Weight:       230.0 lb Date of Birth:  06-Feb-1956     BSA:          2.215  m Patient Age:    65 years     BP:           122/69 mmHg Patient Gender: M            HR:           69 bpm. Exam Location:  Inpatient Procedure: 2D Echo, Cardiac Doppler and Color Doppler Indications:    CAD  History:        Patient has prior history of Echocardiogram examinations, most                 recent 02/15/2021. Aortic Valve Disease; Risk Factors:Diabetes and                 Hypertension.  Sonographer:    Cammy Brochure Referring Phys: [place]  1. Left ventricular ejection fraction, by estimation, is 50 to 55%. The left ventricle has low normal function. The left ventricle has no regional wall motion abnormalities. There is mild left ventricular hypertrophy. Left ventricular diastolic parameters are consistent with Grade II diastolic dysfunction (pseudonormalization).  2. Peak RV-RA gradient 21 mmHg. Right ventricular systolic function is normal. The right ventricular size is normal.  3. Left atrial size was moderately dilated.  4. Right atrial size was mildly dilated.  5. The mitral valve is normal in structure. Mild mitral valve regurgitation. No evidence of mitral stenosis.  6. The aortic valve is tricuspid. Aortic valve regurgitation is trivial. Mild aortic valve stenosis.  7. Aortic dilatation noted. There is mild dilatation of the aortic root, measuring 43 mm. FINDINGS  Left Ventricle: Left ventricular ejection fraction, by estimation, is 50 to 55%. The left ventricle has low normal function. The left ventricle has no regional wall motion abnormalities. The left ventricular internal cavity size was normal in size. There is mild left ventricular hypertrophy. Left ventricular diastolic parameters are consistent with Grade II diastolic dysfunction (pseudonormalization). Right Ventricle: Peak RV-RA gradient 21 mmHg. The right ventricular size is normal. No increase in right ventricular wall thickness. Right ventricular systolic function is normal. Left Atrium: Left atrial size  was moderately dilated. Right Atrium: Right atrial size was mildly dilated. Pericardium: There is no evidence of pericardial effusion. Mitral Valve: The mitral valve is normal in structure. There is mild thickening of the mitral valve leaflet(s). Mild mitral annular calcification. Mild mitral valve regurgitation. No evidence of mitral valve stenosis. Tricuspid Valve: The tricuspid valve is normal in structure. Tricuspid valve regurgitation is trivial. Aortic Valve: The aortic valve is tricuspid. Aortic valve regurgitation is trivial. Mild aortic stenosis is present. Aortic valve mean gradient measures 8.0 mmHg. Aortic valve peak gradient measures 16.8 mmHg. Aortic valve area, by VTI measures 2.50 cm. Pulmonic Valve: The pulmonic valve was normal in structure. Pulmonic valve regurgitation is trivial. Aorta: Aortic dilatation noted. There is mild dilatation of the aortic root, measuring 43 mm. Venous: The inferior vena cava was not well visualized. IAS/Shunts: No atrial level shunt detected by color flow Doppler.  LEFT VENTRICLE PLAX 2D LVIDd:         5.10 cm  Diastology LVIDs:         3.20 cm  LV e' medial:    8.27 cm/s LV PW:         1.10 cm  LV E/e' medial:  16.7 LV IVS:        1.40 cm  LV e' lateral:   6.64 cm/s LVOT diam:     2.30 cm  LV E/e' lateral: 20.8 LV SV:         114 LV SV Index:   51 LVOT Area:     4.15 cm  RIGHT VENTRICLE RV Basal diam:  4.00 cm RV S prime:     14.00 cm/s TAPSE (M-mode): 2.5 cm LEFT ATRIUM              Index       RIGHT ATRIUM           Index LA diam:        4.00 cm  1.81 cm/m  RA Area:     20.60 cm LA Vol (A2C):   111.0 ml 50.11 ml/m RA Volume:   59.00 ml  26.64 ml/m LA Vol (A4C):   101.0 ml 45.60 ml/m LA Biplane Vol: 110.0 ml 49.66 ml/m  AORTIC VALVE AV Area (Vmax):    2.19 cm AV Area (Vmean):   2.25 cm AV Area (VTI):     2.50 cm AV Vmax:           205.00 cm/s AV Vmean:          129.000 cm/s AV VTI:            0.455 m AV Peak Grad:      16.8 mmHg AV Mean Grad:      8.0 mmHg  LVOT Vmax:         108.00 cm/s LVOT Vmean:        69.800 cm/s LVOT VTI:          0.274 m LVOT/AV VTI ratio: 0.60  AORTA Ao Root diam: 4.30 cm Ao Asc diam:  3.70 cm MITRAL VALVE                TRICUSPID VALVE MV Area (PHT): 3.85 cm     TR Peak grad:   21.0 mmHg MV Decel Time: 197 msec     TR Vmax:        229.00 cm/s MV E velocity: 138.00 cm/s MV A velocity: 59.80 cm/s   SHUNTS MV E/A ratio:  2.31         Systemic VTI:  0.27 m                             Systemic Diam: 2.30 cm Loralie Champagne MD Electronically signed by Loralie Champagne MD Signature Date/Time: 02/15/2021/5:21:59 PM    Final    ECHOCARDIOGRAM LIMITED  Result Date: 02/16/2021    ECHOCARDIOGRAM LIMITED REPORT   Patient Name:   LEV CERVONE Date of Exam: 02/16/2021 Medical Rec #:  [id number]    Height:       70.0 in Accession #:    [id number]   Weight:       238.8 lb Date of Birth:  06-23-1956     BSA:          2.251 m Patient Age:    72 years     BP:           138/80 mmHg Patient Gender: M            HR:           60 bpm. Exam Location:  Inpatient Procedure: Limited Echo, Color Doppler and Cardiac Doppler Indications:    pericardial effusion  History:        Patient has  prior history of Echocardiogram examinations, most                 recent 02/15/2021. CAD, bicuspid aortic valve and Aortic Valve                 Disease, Signs/Symptoms:Chest Pain; Risk Factors:Diabetes,                 Hypertension and Dyslipidemia.  Sonographer:    Johny Chess Referring Phys: [phone] JONATHAN J BERRY  Sonographer Comments: Suboptimal subcostal window. IMPRESSIONS  1. No pericardial effusion  2. Left ventricular ejection fraction, by estimation, is 60 to 65%. The left ventricle has normal function. The left ventricle has no regional wall motion abnormalities. There is mild left ventricular hypertrophy.  3. Right ventricular systolic function is normal. The right ventricular size is normal.  4. The mitral valve is normal in structure. Trivial mitral valve regurgitation.  5.  The aortic valve was not well visualized. There is moderate calcification of the aortic valve. Aortic valve regurgitation is not visualized. Mild aortic valve stenosis.  6. There is mild dilatation of the ascending aorta, measuring 39 mm. FINDINGS  Left Ventricle: Left ventricular ejection fraction, by estimation, is 60 to 65%. The left ventricle has normal function. The left ventricle has no regional wall motion abnormalities. The left ventricular internal cavity size was normal in size. There is  mild left ventricular hypertrophy. Right Ventricle: The right ventricular size is normal. No increase in right ventricular wall thickness. Right ventricular systolic function is normal. Pericardium: There is no evidence of pericardial effusion. Mitral Valve: The mitral valve is normal in structure. Trivial mitral valve regurgitation. Tricuspid Valve: The tricuspid valve is normal in structure. Tricuspid valve regurgitation is trivial. Aortic Valve: The aortic valve was not well visualized. There is moderate calcification of the aortic valve. Aortic valve regurgitation is not visualized. Mild aortic stenosis is present. Aortic valve mean gradient measures 8.0 mmHg. Aortic valve peak gradient measures 16.0 mmHg. Pulmonic Valve: The pulmonic valve was not well visualized. Pulmonic valve regurgitation is not visualized. Aorta: The aortic root, ascending aorta, aortic arch and descending aorta are all structurally normal, with no evidence of dilitation or obstruction. There is mild dilatation of the ascending aorta, measuring 39 mm. IAS/Shunts: The interatrial septum was not well visualized. LEFT VENTRICLE PLAX 2D LVIDd:         4.80 cm Diastology LVIDs:         3.10 cm LV e' medial:    8.27 cm/s LV PW:         1.20 cm LV E/e' medial:  13.5 LV IVS:        1.10 cm LV e' lateral:   8.92 cm/s                        LV E/e' lateral: 12.6  AORTIC VALVE AV Vmax:           200.00 cm/s AV Vmean:          130.000 cm/s AV VTI:             0.396 m AV Peak Grad:      16.0 mmHg AV Mean Grad:      8.0 mmHg LVOT Vmax:         126.00 cm/s LVOT Vmean:        83.300 cm/s LVOT VTI:          0.281 m LVOT/AV VTI ratio: 0.71  AORTA Ao Asc diam: 3.90 cm MITRAL VALVE MV Area (PHT): 2.83 cm     SHUNTS MV Decel Time: 268 msec     Systemic VTI: 0.28 m MV E velocity: 112.00 cm/s MV A velocity: 52.60 cm/s MV E/A ratio:  2.13 Oswaldo Milian MD Electronically signed by Oswaldo Milian MD Signature Date/Time: 02/16/2021/3:35:44 PM    Final    ECHOCARDIOGRAM LIMITED  Result Date: 02/15/2021    ECHOCARDIOGRAM LIMITED REPORT   Patient Name:   TAVIO BIEGEL Date of Exam: 02/15/2021 Medical Rec #:  [id number]    Height:       70.0 in Accession #:    [id number]   Weight:       229.9 lb Date of Birth:  01-16-1956     BSA:          2.215 m Patient Age:    38 years     BP:           114/60 mmHg Patient Gender: M            HR:           52 bpm. Exam Location:  Inpatient Procedure: Limited Echo STAT ECHO Indications:    CAD Native Vessel I25.10                 Pericardial effusion I31.3  History:        Patient has prior history of Echocardiogram examinations, most                 recent 01/19/2021. PAD, Aortic Valve Disease; Risk                 Factors:Hypertension, Diabetes, Dyslipidemia and Former Smoker.                 Thoracic aortic aneurysm. Chronic kidney disease.  Sonographer:    Darlina Sicilian RDCS Sonographer#2:  Merrie Roof RDCS Referring Phys: [place]  1. Left ventricular ejection fraction, by estimation, is 55 to 60%. The left ventricle has normal function. The left ventricle has no regional wall motion abnormalities.  2. Right ventricular systolic function is normal. The right ventricular size is normal.  3. Limited echo. No doppler. No pericardial effusion. FINDINGS  Left Ventricle: Left ventricular ejection fraction, by estimation, is 55 to 60%. The left ventricle has normal function. The left ventricle has no regional wall motion  abnormalities. Right Ventricle: The right ventricular size is normal. No increase in right ventricular wall thickness. Right ventricular systolic function is normal. Left Atrium: Left atrial size was normal in size. Right Atrium: Right atrial size was normal in size. Pericardium: There is no evidence of pericardial effusion. IAS/Shunts: No atrial level shunt detected by color flow Doppler. Loralie Champagne MD Electronically signed by Loralie Champagne MD Signature Date/Time: 02/15/2021/3:47:54 PM    Final     Cardiac Studies   02/15/2021  Unsuccessful planned CSI of high-grade calcified first diagonal branch because of vessel perforation with extravasation requiring emergent administration of protamine with ACT reversal and balloon tamponade of the ostium of the diagonal branch resulting in resolution of the perforation.  The patient did have a stat 2D echo which revealed no pericardial effusion.  Patient Profile     65 y.o. male with history of HTN, HLD, DM, prior tobacco abuse, aortic atherosclerosis, PAD and CAD who was admitted post attempted PCI of diagonal artery on 02/15/21. Procedure complicated by wire perforation.   Assessment & Plan  CAD: occluded diagonal post attempted PCI. Coronary perforation: no evidence of bleeding or pericardial effusion. AFib: newly acknowledged abnormality, but per the patient's report this is occurred at least once before. Possibly due to epicardial inflammation after coronary perforation?  It appears much more likely that this is incidentally discovered chronic recurrent asymptomatic atrial fibrillation.  In the long run he will need direct oral anticoagulant.  With a recent coronary perforation we will hold off from initiating this.  Discharged on aspirin plus clopidogrel.  Switch to Eliquis plus clopidogrel at his follow-up visit in less than 2 weeks.  We will increase carvedilol for better control PAD: s/p PTA distal L SFA and R popliteal in 2016, repeat stent R  popliteal 2019. Asymptomatic and normal ABI April 2022. Has aortic atherosclerosis, but reported ascending aortic aneurysm not confirmed on CTA 01/30/2021. HLP: LDL 75, close to goal. HDL low at 34. On max dose atorvastatin. Encourage weight loss. HTN: Decrease amlodipine to 5 mg daily to avoid hypotension with a higher dose of carvedilol. ED: Sildenafil and isosorbide mononitrate are both on his list of medications.  He has not used the sildenafil in a couple of months.  We talked about the risk of serious hemodynamic collapse with this combination.  He would like to use sildenafil in the future.  We will stop the isosorbide mononitrate.  It is possible that his exertional symptoms will improve now that he has occluded the diagonal artery and we have increased his dose of carvedilol.   for questions or updates, please contact [place] Please consult [email or web address] for contact info under        Signed, Sanda Klein, MD  02/17/2021, 10:42 AM

## 2021-02-17 NOTE — Progress Notes (Signed)
CARDIAC REHAB PHASE I   PRE:  Rate/Rhythm: 37 SR  BP:  Supine:   Sitting: 138/70  Standing:    SaO2:   MODE:  Ambulation: 560 ft   POST:  Rate/Rhythm: 104 ST  BP:  Supine:   Sitting: 144/74  Standing:    SaO2:  Ambulated independently without difficulty, pt. Did report shortness of breath and fatigue, but NO chest pain.  Reviewed angina symptoms, NTG usage, when to call 911, and when to call the doctor.  Given instructions for home exercise. [id number] Liliane Channel RN, BSN 02/17/2021 9:03 AM

## 2021-02-20 NOTE — Telephone Encounter (Signed)
Insurance is calling back

## 2021-02-20 NOTE — Telephone Encounter (Signed)
Spoke to patient advised I tried calling Leah back about FMLA paperwork but unable to reach her.Advised FMLA paprework was faxed 01/23/21.He stated he just spoke to [facility] and she has been trying to fax more paper work to complete but has been unable to get paperwork to go through.Advised I just heard our fax machine is down at present.Rudene Christians at [phone] ext [phone] left message on personal voice mail our fax machine is down at present.Advised to refax paperwork in a couple of hours.

## 2021-02-22 ENCOUNTER — Encounter: Payer: Self-pay | Admitting: *Deleted

## 2021-02-22 NOTE — Telephone Encounter (Signed)
Left message for pt to call, received FMLA paperwork from [employer] and I have some questions.

## 2021-02-23 ENCOUNTER — Telehealth: Payer: Self-pay | Admitting: Cardiovascular Disease

## 2021-02-23 NOTE — Telephone Encounter (Signed)
Matthew Chen brought forms into the office to be completed. Patient had  spoken to Hilda Blades about these forms. Forms were given to debra, once completed we will contact patient.

## 2021-02-27 ENCOUNTER — Telehealth: Payer: Self-pay

## 2021-02-28 ENCOUNTER — Other Ambulatory Visit: Payer: Self-pay

## 2021-02-28 ENCOUNTER — Encounter: Payer: Self-pay | Admitting: Cardiovascular Disease

## 2021-02-28 ENCOUNTER — Ambulatory Visit (INDEPENDENT_AMBULATORY_CARE_PROVIDER_SITE_OTHER): Payer: 59 | Admitting: Cardiovascular Disease

## 2021-02-28 VITALS — BP 124/72 | HR 56 | Ht 70.0 in | Wt 228.0 lb

## 2021-02-28 DIAGNOSIS — I25118 Atherosclerotic heart disease of native coronary artery with other forms of angina pectoris: Secondary | ICD-10-CM

## 2021-02-28 DIAGNOSIS — I480 Paroxysmal atrial fibrillation: Secondary | ICD-10-CM | POA: Diagnosis not present

## 2021-02-28 DIAGNOSIS — R160 Hepatomegaly, not elsewhere classified: Secondary | ICD-10-CM

## 2021-02-28 MED ORDER — APIXABAN 5 MG PO TABS: 5.0000 mg | ORAL_TABLET | Freq: Two times a day (BID) | ORAL | 1 refills | Status: DC

## 2021-02-28 NOTE — Assessment & Plan Note (Signed)
Matthew Chen was found to have PAF during his hospitalization as well as on subsequent event monitoring.  This certainly could be contributing.  Is unclear whether he had this prior to the procedure.  In any event, his This patients CHA2DS2-VASc Score and unadjusted Ischemic Stroke Rate (% per year) is equal to 4.8 % stroke rate/year from a score of 4.  I am going to discontinue his Plavix today and begin Eliquis oral anticoagulation.  I am referring him to EP for further evaluation and treatment.  Above score calculated as 1 point each if present [CHF, HTN, DM, Vascular=MI/PAD/Aortic Plaque, Age if 65-74, or Male] Above score calculated as 2 points each if present [Age > 75, or Stroke/TIA/TE]

## 2021-02-28 NOTE — Assessment & Plan Note (Signed)
Mr. Hottel returns today for follow-up.  I performed diagnostic coronary angiography on him on 02/08/2021 revealing a high-grade subtotally occluded calcified first diagonal branch ostial stenosis probably responsible for his symptoms.  Because of chronic renal insufficiency and concerns over radiocontrast nephropathy I stage his intervention 1 week later but did load him with Plavix.  On 02/15/2021 I attempted to intervene on his diagonal branch but unfortunately caused a coronary perforation with a Fielder XT wire.  Luckily there was no pericardial effusion or tamponade and I was able to balloon tamponade the origin of the vessel and reverse his anticoagulation with protamine.  Several consecutive echoes continue to show no evidence of pericardial effusion and he was discharged home 2 days later.  He has been on antianginal medications and he says that his exertional angina has markedly improved.

## 2021-02-28 NOTE — Patient Instructions (Signed)
Medication Instructions:  STOP Plavix  Start Eliquis   *If you need a refill on your cardiac medications before your next appointment, please call your pharmacy*   Testing/Procedures: Echocardiogram (2 weeks) - Your physician has requested that you have an echocardiogram. Echocardiography is a painless test that uses sound waves to create images of your heart. It provides your doctor with information about the size and shape of your heart and how well your heart's chambers and valves are working. This procedure takes approximately one hour. There are no restrictions for this procedure. This will be performed at our [facility] location - [address], [address].  LIVER MRI (hospital will call you to get this scheduled)  Follow-Up: At [facility], you and your health needs are our priority.  As part of our continuing mission to provide you with exceptional heart care, we have created designated Provider Care Teams.  These Care Teams include your primary Cardiologist (physician) and Advanced Practice Providers (APPs -  Physician Assistants and Nurse Practitioners) who all work together to provide you with the care you need, when you need it.  We recommend signing up for the patient portal called "MyChart".  Sign up information is provided on this After Visit Summary.  MyChart is used to connect with patients for Virtual Visits (Telemedicine).  Patients are able to view lab/test results, encounter notes, upcoming appointments, etc.  Non-urgent messages can be sent to your provider as well.   To learn more about what you can do with MyChart, go to [email or web address].    Your next appointment:   3 month(s)  The format for your next appointment:   In Person  Provider:   Quay Burow, MD   Other Instructions Referral to EP (they will call you to set up an appointment)

## 2021-02-28 NOTE — Progress Notes (Signed)
02/28/2021 Matthew Chen   11/30/1955  [id number]  Primary Physician Pickard, Cammie Mcgee, MD Primary Cardiologist: Lorretta Harp MD Garret Reddish, [place], [place]  HPI:  Matthew Chen is a 65 y.o.   mild to moderately overweight married Caucasian male father of 4 children, grandfather of a grandchildren was referred by Dr. Skeet Latch for evaluation and treatment of claudication. I last saw him in the office      02/06/2021.  He  has a history of treated hypertension, diabetes and hyperlipidemia. 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 He's never had a heart attack or stroke and denies chest pain or shortness of breath. He does complain of lifestyle limiting claudication and had arterial Doppler studies performed in our office 05/29/15 revealing high-grade right popliteal and distal left SFA disease. Based on this, we decided to proceed with angiography and potential percutaneous revascularization. I initially angiogrammed him on 06/29/15 and performed diamondback orbital rotational atherectomy, drug eluting balloon angioplasty of the distal left SFA for a high-grade, subocclusive calcific/exophytic plaque. He had a high-grade calcific/exophytic subocclusive right popliteal artery plaque for which he underwent diamondback orbital rotation arthrectomy, drug eluding balloon angioplasty on 07/13/15 with an excellent angiographic result. Since that time his claudication has resolved. His Dopplers performed  03/17/2018 revealed normal ABIs bilaterally with the development of a high-frequency signal in his right popliteal artery.  Since I saw him 8 months ago he has developed lifestyle limiting right calf claudication.   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  He had 30 to 40% mid left SFA stenosis.  I performed diamondback orbital rotational atherectomy, PTA and stenting using a Tigris  7 mm x 40 mm long self-expanding stent.  His Dopplers normalized and his claudication has resolved.    His most recent Dopplers performed 11/13/2020 revealed normal ABIs bilaterally with a patent right SFA stent.  When I saw him 3 weeks ago he was complaining of exertional chest pain and dyspnea over the last several months.  I did obtain a 2D echo that was unremarkable with an EF of 60 to 65% with mild MR and mild AAS.  He did have a 46 mm ascending thoracic aortic aneurysm.  Myoview stress test was nonischemic.  A chest CT did show a 7.1 cm mass in his liver and a MRI was suggested.  He is wearing a event monitor currently.  Based on his symptoms we decided to proceed with outpatient diagnostic coronary angiography.  I performed coronary angiography on him 02/08/2021 revealing a high-grade subtotally occluded ostial calcified first diagonal branch stenosis.  Because of chronic renal insufficiency I elected to stage his intervention for 1 week later but did begin him on Plavix.  On 02/15/2021 I attempted to intervene on his diagonal branch but unfortunately this was complicated by coronary perforation using a "Fielder XT wire".  Luckily, he did not develop a pericardial effusion and this was quickly addressed during the procedure.  He was discharged home 2 days later.  He was found to have PAF during the hospitalization which was confirmed by event monitoring subsequent to that.  Since beginning him on antianginal medications his effort angina has significantly improved.   Current Meds  Medication Sig   amLODipine (NORVASC) 5 MG tablet Take 1 tablet (5 mg total) by mouth daily.   aspirin EC 81 MG tablet Take 1  tablet (81 mg total) by mouth daily.   atorvastatin (LIPITOR) 80 MG tablet Take 1 tablet (80 mg total) by mouth daily.   blood glucose meter kit and supplies Dispense based on patient and insurance preference. Check BS daily. 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 Needs strips and lancets #100 /5 refills   carvedilol  (COREG) 12.5 MG tablet Take 3 tablets (37.5 mg total) by mouth 2 (two) times daily.   clopidogrel (PLAVIX) 75 MG tablet Take 1 tablet (75 mg total) by mouth daily.   fenofibrate 160 MG tablet TAKE 1 TABLET BY MOUTH EVERY DAY   Insulin Pen Needle (B-D UF III MINI PEN NEEDLES) 31G X 5 MM MISC USE DAILY WITH VICTOZA   lisinopril-hydrochlorothiazide (ZESTORETIC) 20-25 MG tablet TAKE 1 TABLET BY MOUTH EVERY DAY   Omega-3 Fatty Acids (FISH OIL) 1000 MG CAPS Take 1,000 mg by mouth 2 (two) times daily.   ONETOUCH ULTRA test strip CHECK BLOOD SUGAR TWICE A DAY   pantoprazole (PROTONIX) 40 MG tablet Take 1 tablet (40 mg total) by mouth 2 (two) times daily.   pioglitazone (ACTOS) 30 MG tablet TAKE 1 TABLET BY MOUTH EVERY DAY STOP TRADJENTA   vedolizumab (ENTYVIO) 300 MG injection Inject 300 mg as directed See admin instructions. Every 2 months   VICTOZA 18 MG/3ML SOPN INJECT 1.2 MG UNDER THE SKIN ONCE DAILY     Allergies  Allergen Reactions   Penicillins Other (See Comments)    Unsure, childhood reaction Has patient had a PCN reaction causing immediate rash, facial/tongue/throat swelling, SOB or lightheadedness with hypotension: Unknown Has patient had a PCN reaction causing severe rash involving mucus membranes or skin necrosis: Unknown Has patient had a PCN reaction that required hospitalization: No Has patient had a PCN reaction occurring within the last 10 years: No If all of the above answers are "NO", then may proceed with Cephalosporin use.     Social History   Socioeconomic History   Marital status: Married    Spouse name: Matthew Chen   Number of children: 2   Years of education: Not on file   Highest education level: Not on file  Occupational History   Occupation: Truck Education administrator: Counsellor of Librarian, academic  Tobacco Use   Smoking status: Former    Packs/day: 1.50    Years: 25.00    Pack years: 37.50    Types: Cigarettes    Quit date: 08/12/1996    Years since quitting:  24.5   Smokeless tobacco: Never  Vaping Use   Vaping Use: Never used  Substance and Sexual Activity   Alcohol use: Yes    Alcohol/week: 1.0 standard drink    Types: 1 Cans of beer per week    Comment: social use   Drug use: No   Sexual activity: Yes  Other Topics Concern   Not on file  Social History Narrative   ** Merged History Encounter **       Social Determinants of Health   Financial Resource Strain: Not on file  Food Insecurity: Not on file  Transportation Needs: Not on file  Physical Activity: Not on file  Stress: Not on file  Social Connections: Not on file  Intimate Partner Violence: Not on file     Review of Systems: General: negative for chills, fever, night sweats or weight changes.  Cardiovascular: negative for chest pain, dyspnea on exertion, edema, orthopnea, palpitations, paroxysmal nocturnal dyspnea or shortness of breath Dermatological: negative for rash Respiratory: negative for  cough or wheezing Urologic: negative for hematuria Abdominal: negative for nausea, vomiting, diarrhea, bright red blood per rectum, melena, or hematemesis Neurologic: negative for visual changes, syncope, or dizziness All other systems reviewed and are otherwise negative except as noted above.    There were no vitals taken for this visit.  General appearance: alert and no distress Neck: no adenopathy, no carotid bruit, no JVD, supple, symmetrical, trachea midline, and thyroid not enlarged, symmetric, no tenderness/mass/nodules Lungs: clear to auscultation bilaterally Heart: regular rate and rhythm, S1, S2 normal, no murmur, click, rub or gallop Extremities: extremities normal, atraumatic, no cyanosis or edema Pulses: 2+ and symmetric Skin: Skin color, texture, turgor normal. No rashes or lesions Neurologic: Grossly normal  EKG sinus bradycardia at 56 without ST or T wave changes.  I personally reviewed this EKG.  ASSESSMENT AND PLAN:   CAD (coronary artery  disease) Mr. Ankney returns today for follow-up.  I performed diagnostic coronary angiography on him on 02/08/2021 revealing a high-grade subtotally occluded calcified first diagonal branch ostial stenosis probably responsible for his symptoms.  Because of chronic renal insufficiency and concerns over radiocontrast nephropathy I stage his intervention 1 week later but did load him with Plavix.  On 02/15/2021 I attempted to intervene on his diagonal branch but unfortunately caused a coronary perforation with a Fielder XT wire.  Luckily there was no pericardial effusion or tamponade and I was able to balloon tamponade the origin of the vessel and reverse his anticoagulation with protamine.  Several consecutive echoes continue to show no evidence of pericardial effusion and he was discharged home 2 days later.  He has been on antianginal medications and he says that his exertional angina has markedly improved.  PAF (paroxysmal atrial fibrillation) [facility]) Mr. Dickerman was found to have PAF during his hospitalization as well as on subsequent event monitoring.  This certainly could be contributing.  Is unclear whether he had this prior to the procedure.  In any event, his This patients CHA2DS2-VASc Score and unadjusted Ischemic Stroke Rate (% per year) is equal to 4.8 % stroke rate/year from a score of 4.  I am going to discontinue his Plavix today and begin Eliquis oral anticoagulation.  I am referring him to EP for further evaluation and treatment.  Above score calculated as 1 point each if present [CHF, HTN, DM, Vascular=MI/PAD/Aortic Plaque, Age if 65-74, or Male] Above score calculated as 2 points each if present [Age > 75, or Stroke/TIA/TE]      Lorretta Harp MD [facility], [facility] 02/28/2021 10:22 AM

## 2021-03-01 ENCOUNTER — Telehealth: Payer: Self-pay | Admitting: Cardiovascular Disease

## 2021-03-01 MED ORDER — EMPAGLIFLOZIN 25 MG PO TABS: 25.0000 mg | ORAL_TABLET | Freq: Every day | ORAL | 3 refills | Status: DC

## 2021-03-01 NOTE — Telephone Encounter (Signed)
He needs to continue with the aspirin.

## 2021-03-01 NOTE — Telephone Encounter (Signed)
This RN returned pt's call, pt saw [name] yesterday, [name] discontinued pt's Plavix and started him on Eliquis 22m. Pt wanted to know if he should still take his 81maspirin daily or if this needed to be discontinued. Pt denies any symptoms at this time. This RN advised pt his question would be forwarded to Dr. BeGwenlyn Foundor review. Pt verbalized understanding.

## 2021-03-01 NOTE — Telephone Encounter (Signed)
Called pt back, relayed message from [place], [place]:  Rockne Menghini, RPH-CPP   Pharmacist  Specialty:  Pharmacist  Telephone Encounter  Signed  Creation Time:  03/01/2021 10:29 AM           Signed      He needs to continue with the aspirin          Pt verbalized understanding. All questions/concerns addressed at this time.

## 2021-03-01 NOTE — Telephone Encounter (Signed)
Pt c/o medication issue:  1. Name of Medication: aspirin EC 81 MG tablet  2. How are you currently taking this medication (dosage and times per day)? As directed  3. Are you having a reaction (difficulty breathing--STAT)?   4. What is your medication issue? Patient was just put on Eliquis yesterday. He wanted to know if he should still take the aspirin or not. Please advise

## 2021-03-01 NOTE — Telephone Encounter (Signed)
Insurance will cover jardiance.

## 2021-03-01 NOTE — Telephone Encounter (Signed)
Medication sent.

## 2021-03-02 ENCOUNTER — Other Ambulatory Visit: Payer: Self-pay

## 2021-03-02 ENCOUNTER — Other Ambulatory Visit: Payer: 59

## 2021-03-02 DIAGNOSIS — E785 Hyperlipidemia, unspecified: Secondary | ICD-10-CM

## 2021-03-02 DIAGNOSIS — Z13220 Encounter for screening for lipoid disorders: Secondary | ICD-10-CM

## 2021-03-02 DIAGNOSIS — Z136 Encounter for screening for cardiovascular disorders: Secondary | ICD-10-CM

## 2021-03-02 DIAGNOSIS — E1169 Type 2 diabetes mellitus with other specified complication: Secondary | ICD-10-CM

## 2021-03-02 DIAGNOSIS — I10 Essential (primary) hypertension: Secondary | ICD-10-CM

## 2021-03-02 DIAGNOSIS — N183 Chronic kidney disease, stage 3 unspecified: Secondary | ICD-10-CM

## 2021-03-02 NOTE — Telephone Encounter (Signed)
This encounter was created in error - please disregard.

## 2021-03-03 LAB — COMPLETE METABOLIC PANEL WITH GFR
AG Ratio: 1.6 (calc) (ref 1.0–2.5)
ALT: 28 U/L (ref 9–46)
AST: 24 U/L (ref 10–35)
Albumin: 4.2 g/dL (ref 3.6–5.1)
Alkaline phosphatase (APISO): 71 U/L (ref 35–144)
BUN/Creatinine Ratio: 14 (calc) (ref 6–22)
BUN: 29 mg/dL — ABNORMAL HIGH (ref 7–25)
CO2: 27 mmol/L (ref 20–32)
Calcium: 9.5 mg/dL (ref 8.6–10.3)
Chloride: 104 mmol/L (ref 98–110)
Creat: 2.03 mg/dL — ABNORMAL HIGH (ref 0.70–1.35)
Globulin: 2.6 g/dL (calc) (ref 1.9–3.7)
Glucose, Bld: 174 mg/dL — ABNORMAL HIGH (ref 65–99)
Potassium: 4.3 mmol/L (ref 3.5–5.3)
Sodium: 139 mmol/L (ref 135–146)
Total Bilirubin: 0.5 mg/dL (ref 0.2–1.2)
Total Protein: 6.8 g/dL (ref 6.1–8.1)
eGFR: 36 mL/min/{1.73_m2} — ABNORMAL LOW (ref >=60)

## 2021-03-03 LAB — CBC WITH DIFFERENTIAL/PLATELET
Absolute Monocytes: 626 cells/uL (ref 200–950)
Basophils Absolute: 41 cells/uL (ref 0–200)
Basophils Relative: 0.9 %
Eosinophils Absolute: 162 cells/uL (ref 15–500)
Eosinophils Relative: 3.6 %
HCT: 34.4 % — ABNORMAL LOW (ref 38.5–50.0)
Hemoglobin: 11.1 g/dL — ABNORMAL LOW (ref 13.2–17.1)
Lymphs Abs: 968 cells/uL (ref 850–3900)
MCH: 29.4 pg (ref 27.0–33.0)
MCHC: 32.3 g/dL (ref 32.0–36.0)
MCV: 91 fL (ref 80.0–100.0)
MPV: 11 fL (ref 7.5–12.5)
Monocytes Relative: 13.9 %
Neutro Abs: 2705 cells/uL (ref 1500–7800)
Neutrophils Relative %: 60.1 %
Platelets: 251 10*3/uL (ref 140–400)
RBC: 3.78 10*6/uL — ABNORMAL LOW (ref 4.20–5.80)
RDW: 13.7 % (ref 11.0–15.0)
Total Lymphocyte: 21.5 %
WBC: 4.5 10*3/uL (ref 3.8–10.8)

## 2021-03-03 LAB — HEMOGLOBIN A1C
Hgb A1c MFr Bld: 7.8 % of total Hgb — ABNORMAL HIGH (ref <5.7)
Mean Plasma Glucose: 177 mg/dL
eAG (mmol/L): 9.8 mmol/L

## 2021-03-03 LAB — LIPID PANEL
Cholesterol: 133 mg/dL (ref <200)
HDL: 30 mg/dL — ABNORMAL LOW (ref >=40)
LDL Cholesterol (Calc): 68 mg/dL (calc)
Non-HDL Cholesterol (Calc): 103 mg/dL (calc) (ref <130)
Total CHOL/HDL Ratio: 4.4 (calc) (ref <5.0)
Triglycerides: 266 mg/dL — ABNORMAL HIGH (ref <150)

## 2021-03-05 NOTE — Telephone Encounter (Signed)
FMLA coordinator will check status and let me know

## 2021-03-05 NOTE — Telephone Encounter (Signed)
Pt is following up on the status of his paperwork, patient states the paperwork should have been faxed to [employer] [phone]. Please advise pt further

## 2021-03-05 NOTE — Telephone Encounter (Signed)
We did not receive the forms that [employer]le[employer]. Danielle at [facility] will re-fax form.  Fax received.  Pt notified. Completed form given to our [facility] coordinator

## 2021-03-05 NOTE — Telephone Encounter (Signed)
Per FMLA coordinator,all paperwork is filled out on our end. Number on form [phone] [employer]. Called and LMVM for the status. Called [employer] and they will CB."The hold time is more thatn 52mn"

## 2021-03-05 NOTE — Telephone Encounter (Signed)
FMLA Coordinator is returning call.

## 2021-03-07 NOTE — Telephone Encounter (Signed)
Updated forms were faxed to [employer] on 7/27.

## 2021-03-07 NOTE — Telephone Encounter (Signed)
Forms were faxed on 7/21 to [employer].

## 2021-03-08 ENCOUNTER — Other Ambulatory Visit: Payer: Self-pay | Admitting: Cardiology

## 2021-03-13 ENCOUNTER — Telehealth: Payer: Self-pay | Admitting: Cardiovascular Disease

## 2021-03-13 ENCOUNTER — Other Ambulatory Visit: Payer: Self-pay

## 2021-03-13 ENCOUNTER — Ambulatory Visit (HOSPITAL_COMMUNITY): Payer: 59 | Attending: Cardiology

## 2021-03-13 DIAGNOSIS — I25118 Atherosclerotic heart disease of native coronary artery with other forms of angina pectoris: Secondary | ICD-10-CM | POA: Diagnosis not present

## 2021-03-13 LAB — ECHOCARDIOGRAM COMPLETE
AR max vel: 2.62 cm2
AV Area VTI: 3.25 cm2
AV Area mean vel: 2.76 cm2
AV Mean grad: 9.7 mmHg
AV Peak grad: 19.5 mmHg
Ao pk vel: 2.21 m/s
Area-P 1/2: 1.98 cm2
Calc EF: 59.9 %
S' Lateral: 2.7 cm
Single Plane A2C EF: 56.5 %
Single Plane A4C EF: 59.9 %

## 2021-03-13 NOTE — Telephone Encounter (Signed)
Spoke with the patient to get details about what he needs. He stated that the paperwork was completed for both [employer] and [employer] but that there were some sections that needed updated. The patient stated that he was ready to go back to work but is unable to until the forms are filled out for the [facility] and disability.

## 2021-03-13 NOTE — Telephone Encounter (Signed)
Patient called to say he need something fax to [facility] to say that he is still out of work. [phone] please have this number on the form  [id number]. Also refax to [employer] because still have received fax [phone]. Please advise

## 2021-03-14 NOTE — Addendum Note (Signed)
Addended by: Jacqulynn Cadet on: 03/14/2021 10:06 AM   Modules accepted: Orders

## 2021-03-14 NOTE — Telephone Encounter (Signed)
Pt is calling back in to check the status on his paperwork

## 2021-03-14 NOTE — Telephone Encounter (Signed)
Spoke with pt, aware will check with Matthew Chen in the morning and let him know.

## 2021-03-14 NOTE — Telephone Encounter (Signed)
I have a copy of the forms, I will give them to [place] today.

## 2021-03-15 NOTE — Telephone Encounter (Signed)
Spoke with patient to give update. I informed him that I called [employer], and [employer] 2 times yesterday. Voicemail was left for both companies. I have not heard anything back as of this morning.  I informed patient I would reach out to  [employer], and [employer] this morning when they open, to get clarification of what needs to be updated / completed on forms.

## 2021-03-16 ENCOUNTER — Encounter: Payer: Self-pay | Admitting: Cardiovascular Disease

## 2021-03-16 NOTE — Telephone Encounter (Signed)
Spoke with Danielle from [employer]. She faxed a letter to our office with additional information that was requested. Information was faxed to employer on 8/5.

## 2021-03-19 ENCOUNTER — Telehealth: Payer: Self-pay | Admitting: *Deleted

## 2021-03-19 ENCOUNTER — Other Ambulatory Visit: Payer: Self-pay

## 2021-03-19 DIAGNOSIS — I10 Essential (primary) hypertension: Secondary | ICD-10-CM

## 2021-03-19 DIAGNOSIS — E785 Hyperlipidemia, unspecified: Secondary | ICD-10-CM

## 2021-03-19 DIAGNOSIS — E1169 Type 2 diabetes mellitus with other specified complication: Secondary | ICD-10-CM

## 2021-03-19 MED ORDER — PIOGLITAZONE HCL 30 MG PO TABS: ORAL_TABLET | ORAL | 2 refills | Status: DC

## 2021-03-19 MED ORDER — EMPAGLIFLOZIN 25 MG PO TABS: 25.0000 mg | ORAL_TABLET | Freq: Every day | ORAL | 3 refills | Status: DC

## 2021-03-19 MED ORDER — FREESTYLE LIBRE 2 SENSOR MISC: 0 refills | Status: DC

## 2021-03-19 MED ORDER — CARVEDILOL 12.5 MG PO TABS
37.5000 mg | ORAL_TABLET | Freq: Two times a day (BID) | ORAL | 2 refills | Status: DC
Start: 2021-03-19 — End: 2021-07-31

## 2021-03-19 MED ORDER — AMLODIPINE BESYLATE 5 MG PO TABS
5.0000 mg | ORAL_TABLET | Freq: Every day | ORAL | 2 refills | Status: DC
Start: 2021-03-19 — End: 2022-02-20

## 2021-03-19 MED ORDER — BD PEN NEEDLE MINI U/F 31G X 5 MM MISC: 0 refills | Status: DC

## 2021-03-19 MED ORDER — ONETOUCH ULTRA VI STRP: ORAL_STRIP | 15 refills | Status: DC

## 2021-03-19 MED ORDER — FREESTYLE LIBRE 2 READER DEVI: 0 refills | Status: DC

## 2021-03-19 MED ORDER — PANTOPRAZOLE SODIUM 40 MG PO TBEC: 40.0000 mg | DELAYED_RELEASE_TABLET | Freq: Two times a day (BID) | ORAL | 11 refills | Status: DC

## 2021-03-19 MED ORDER — FENOFIBRATE 160 MG PO TABS: 160.0000 mg | ORAL_TABLET | Freq: Every day | ORAL | 1 refills | Status: DC

## 2021-03-19 MED ORDER — VICTOZA 18 MG/3ML ~~LOC~~ SOPN: PEN_INJECTOR | SUBCUTANEOUS | 9 refills | Status: DC

## 2021-03-19 MED ORDER — LISINOPRIL-HYDROCHLOROTHIAZIDE 20-25 MG PO TABS
1.0000 | ORAL_TABLET | Freq: Every day | ORAL | 2 refills | Status: DC
Start: 2021-03-19 — End: 2022-01-29

## 2021-03-19 MED ORDER — APIXABAN 5 MG PO TABS: 5.0000 mg | ORAL_TABLET | Freq: Two times a day (BID) | ORAL | 1 refills | Status: DC

## 2021-03-19 NOTE — Telephone Encounter (Signed)
Received call from patient.   Reports that blood sugars readings are as follows:  AM p Breakfast p Lunch p Dinner  28-Jul 163 [phone]  29-Jul 186 249 270 218  30-Jul 172 297 ---------- ----------  31-Jul 200 255 222 269  1-Aug 177 270  279  2-Aug 184 333 218 161  3-Aug 142 236 214 258  4-Aug 141 224 227 212  5-Aug 154 219 204 ----------  6-Aug 145 246 214 254  7-Aug 189 240 ---------- -----------  8-Aug 261 302 206    States that he is currently taking Acots 93m, Jardiance 237mand Victoza 1.3m41m  Please advise.

## 2021-03-20 MED ORDER — GLIPIZIDE ER 5 MG PO TB24: 5.0000 mg | ORAL_TABLET | Freq: Every day | ORAL | 0 refills | Status: DC

## 2021-03-20 NOTE — Telephone Encounter (Signed)
Patient is calling back, requesting an update on the paperwork.

## 2021-03-20 NOTE — Telephone Encounter (Signed)
Call placed to patient and patient made aware.   Agreeable to Glipizide.   Patient will continue to monitor FSBS and will call back in (1) month.

## 2021-03-20 NOTE — Telephone Encounter (Signed)
Spoke to the patient. He was calling for an update. Patient has been made aware that we are waiting for a call back from the two companies.  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   He provided numbers below:  [phone] [facility] [phone] [facility]

## 2021-03-21 ENCOUNTER — Telehealth: Payer: Self-pay | Admitting: Cardiovascular Disease

## 2021-03-21 NOTE — Telephone Encounter (Signed)
Leah from [facility] calling stating they received a fax stating the patient was admitted for 7/7-7/9 and that is not enough information for his disability claim. She states the patient said he was seen 7/20 and was given a return to work date of 7/25. She would like a call back with more information. Phone: [phone] BOF:[id number]

## 2021-03-21 NOTE — Telephone Encounter (Signed)
Mr.Sconyers came into office on 8/8, stated that forms were not received at [employer]. Patient wanted to know if Dr.Berry would be able to put patient on disability due to patient not being able to do job, per employer requirements. Patient was informed that we would have to discuss with Dr.Berry. Contacted patient to inform him that employer will need to send over Disability forms so Dr.Berry can complete.

## 2021-03-21 NOTE — Telephone Encounter (Signed)
Called [employer] back, spoke with Phyl B., who was requesting information from patient's office note from 02/28/2021. Advised that [employer] would need to fax a form requesting this information to the office for review. Provided fax number [phone]. Phyl verbalized understanding, reports a fax will be sent.

## 2021-03-22 ENCOUNTER — Other Ambulatory Visit: Payer: Self-pay

## 2021-03-22 ENCOUNTER — Ambulatory Visit (HOSPITAL_COMMUNITY)
Admission: RE | Admit: 2021-03-22 | Discharge: 2021-03-22 | Disposition: A | Discharge status: Home / Self Care | Payer: 59 | Source: Ambulatory Visit | Attending: Cardiovascular Disease | Admitting: Cardiovascular Disease

## 2021-03-22 DIAGNOSIS — R160 Hepatomegaly, not elsewhere classified: Secondary | ICD-10-CM | POA: Diagnosis present

## 2021-03-22 MED ORDER — GADOBUTROL 1 MMOL/ML IV SOLN
10.0000 mL | Freq: Once | INTRAVENOUS | Status: AC | PRN
  Administered 2021-03-22: 10 mL via INTRAVENOUS

## 2021-03-22 NOTE — Telephone Encounter (Signed)
New Disability forms faxed to our office and put in Dr.Berry box on 8/11.

## 2021-03-23 ENCOUNTER — Telehealth: Payer: Self-pay | Admitting: Cardiovascular Disease

## 2021-03-23 NOTE — Telephone Encounter (Signed)
Spoke representative from radiology - Rn informed  the report is visible in Epic .  Information will be  forward to Dr Gwenlyn Found to review.

## 2021-03-23 NOTE — Telephone Encounter (Signed)
Dianne from [facility] would like to know if we can see this pt's MRI Abdomen from 03/22/21

## 2021-03-26 ENCOUNTER — Telehealth: Payer: Self-pay | Admitting: Family Medicine

## 2021-03-26 ENCOUNTER — Encounter: Payer: Self-pay | Admitting: Family Medicine

## 2021-03-26 ENCOUNTER — Other Ambulatory Visit: Payer: Self-pay | Admitting: Family Medicine

## 2021-03-26 DIAGNOSIS — R160 Hepatomegaly, not elsewhere classified: Secondary | ICD-10-CM

## 2021-03-26 NOTE — Telephone Encounter (Signed)
Spoke with pt, aware dr berry will not continue to give him disability. He is not disabled from his thoracic aortic aneurysm, his current employer will not let him work due to the size of the aorta. Patient voiced understanding he will need to get in touch with his medical doctor about this and his recent CT results.

## 2021-03-26 NOTE — Telephone Encounter (Signed)
Patient aware.

## 2021-03-26 NOTE — Telephone Encounter (Signed)
Patient called to follow up on CT scan done 8/11. Patient told he needs to see a specialist; wants to know how to proceed. Please advise at [phone].

## 2021-03-26 NOTE — Telephone Encounter (Signed)
Spoke with pt, aware dr berry will not complete the recent disability papers. The patient is not disabled from a cardiac standpoint. His employer will not let him work at his current job. Also results of recent CT scan discussed with the patient and dr pickard has sent the patient a my chart message.

## 2021-03-26 NOTE — Telephone Encounter (Signed)
Nurse spoke with patient, provider suggested PCP fill out forms for Matthew Chen. Contact Matthew Chen to ask if he wanted to pick up forms from the office or for me to mail them. Matthew Chen said he will come by [facility] office to pick up his forms. I informed Matthew Chen that the forms will be at the front desk in the pick up bin when he is ready to come get them.

## 2021-03-26 NOTE — Telephone Encounter (Signed)
MRI of Abdomen was ordered by cardiology.   Impression of imaging as follows:  1. There is an arterially hyperenhancing subcapsular mass of the anterior inferior right lobe of the liver, hepatic segment VI, measuring approximately 7.0 x 5.1 cm. This does not clearly demonstrate capsular enhancement but does evidence some internal washout on delayed phase imaging. Findings are most consistent with hepatocellular carcinoma. Although LI-RADS does not strictly apply in the absence of an established diagnosis of cirrhosis or chronic hepatitis B, this lesion meets criteria for LI-RADS category 5 (definitely hepatocellular carcinoma).   2. No evidence of lymphadenopathy or metastatic disease in the abdomen.   3.  Hepatic steatosis.  Please advise.

## 2021-03-28 ENCOUNTER — Other Ambulatory Visit: Payer: Self-pay | Admitting: Cardiology

## 2021-03-28 ENCOUNTER — Other Ambulatory Visit: Payer: Self-pay | Admitting: Family Medicine

## 2021-03-28 ENCOUNTER — Other Ambulatory Visit: Payer: Self-pay | Admitting: *Deleted

## 2021-03-28 ENCOUNTER — Other Ambulatory Visit: Payer: Self-pay | Admitting: Cardiovascular Disease

## 2021-03-28 DIAGNOSIS — E1169 Type 2 diabetes mellitus with other specified complication: Secondary | ICD-10-CM

## 2021-03-28 NOTE — Telephone Encounter (Signed)
Patient brought [employer] form into office, says [employer] says this form needs to be completed from the first initial date out of work. Informed nurse I would give them to her or put in provider mailbox to look over the forms and see what we were able to do.

## 2021-04-02 ENCOUNTER — Ambulatory Visit (INDEPENDENT_AMBULATORY_CARE_PROVIDER_SITE_OTHER): Payer: 59 | Admitting: Internal Medicine

## 2021-04-02 ENCOUNTER — Other Ambulatory Visit: Payer: Self-pay

## 2021-04-02 VITALS — BP 128/70 | HR 49 | Ht 70.0 in | Wt 223.0 lb

## 2021-04-02 DIAGNOSIS — I480 Paroxysmal atrial fibrillation: Secondary | ICD-10-CM | POA: Diagnosis not present

## 2021-04-02 NOTE — Progress Notes (Signed)
Electrophysiology Office Note   Date:  04/02/2021   ID:  Carroll, Lingelbach 1956-05-16, MRN [id number]  PCP:  Susy Frizzle, MD  Cardiologist:  Dr Gwenlyn Found Primary Electrophysiologist: Thompson Grayer, MD    CC : afib   History of Present Illness: Matthew Chen is a 65 y.o. male who presents today for electrophysiology evaluation.   He is referred by Dr Gwenlyn Found for EP consultation regarding afib. He has HTN, DM, CAD, CRI, and PVD.   He has an ascending thoracic aortic aneurysm of 38m.   He has a 7.1cm liver mass.  Recent MRI is concerning for [place] He reports being told when having "a test" in July that he had afib.  He was unaware.  He had an event monitor placed which revealed afib with burden of 25%.  He has rare palpitations.  His coreg was increased to 37.555mBID.  In July, he had an attempt to intervene on a diagonal branch stenosis.  This was complicated by cardiac perforation.  He was observed to have afib in this setting.  He was started on eliquis.  He is awaiting evaluate for his abnormal liver MRI.  Today, he denies symptoms of palpitations, chest pain, shortness of breath, orthopnea, PND, lower extremity edema, claudication, dizziness, presyncope, syncope, bleeding, or neurologic sequela. The patient is tolerating medications without difficulties and is otherwise without complaint today.    Past Medical History:  Diagnosis Date   Adenomatous colon polyp 03/2008   Anginal pain ([facility]   Aortic aneurysm, thoracic ([facility]01/29/2018   4.4 cm by echo 05/2015   Arthritis    "joints in my fingers ache" (07/13/2015)   Bicuspid aortic valve 09/09/2016   Cataract 2019   bilateral eyes   CKD (chronic kidney disease), stage III (HCC)    Clotting disorder (HCC)    on plavix for legs   Coronary artery disease    Diabetes mellitus without complication ([facility]   Hemorrhoids    Hyperlipidemia    Hypertension    Peripheral arterial disease ([facility]   a. dopppler 05/29/2015 revealed  high-grade right popliteal and distal left SFA disease b. LE angio 06/28/2015 revealing high grade calcific dx with distal L SFA and R popliteal artery s/p diamondback orbital rotational atherectomy, PTA of L SFA  c. 07/13/2015 diamondback orbital rotational atherectomy and drug eluting balloon angioplasty of R popliteal artery (P2 segment) using distal protection    Type II diabetes mellitus ([facility]   Ulcerative colitis    Past Surgical History:  Procedure Laterality Date   ABDOMINAL AORTOGRAM W/LOWER EXTREMITY N/A 04/27/2018   Procedure: ABDOMINAL AORTOGRAM W/LOWER EXTREMITY;  Surgeon: BeLorretta HarpMD;  [facility];  Service: Cardiovascular;  Laterality: N/A;   COLONOSCOPY W/ POLYPECTOMY  X 4-5   CORONARY ATHERECTOMY N/A 02/15/2021   Procedure: CORONARY ATHERECTOMY;  Surgeon: BeLorretta HarpMD;  [facility];  Service: Cardiovascular;  Laterality: N/A;  aborted    CORONARY BALLOON ANGIOPLASTY N/A 02/15/2021   Procedure: CORONARY BALLOON ANGIOPLASTY;  Surgeon: BeLorretta HarpMD;  [facility];  Service: Cardiovascular;  Laterality: N/A;   COSMETIC SURGERY  < 2000   "removed birthmark from top of my head"   LEFT HEART CATH AND CORONARY ANGIOGRAPHY N/A 02/08/2021   Procedure: LEFT HEART CATH AND CORONARY ANGIOGRAPHY;  Surgeon: BeLorretta HarpMD;  [facility];  Service: Cardiovascular;  Laterality: N/A;  PERIPHERAL VASCULAR ATHERECTOMY  04/27/2018   Procedure: PERIPHERAL VASCULAR ATHERECTOMY;  Surgeon: Lorretta Harp, MD;  Location: [place] [facility];  Service: Cardiovascular;;  right popliteal artery   PERIPHERAL VASCULAR CATHETERIZATION N/A 06/29/2015   Procedure: Lower Extremity Angiography;  Surgeon: Lorretta Harp, MD;  Location: [place] [facility];  Service: Cardiovascular;  Laterality: N/A;   PERIPHERAL VASCULAR CATHETERIZATION N/A 07/13/2015   Procedure: Lower Extremity Angiography;  Surgeon: Lorretta Harp, MD;  Location: [place] [facility];  Service: Cardiovascular;  Laterality: N/A;   PERIPHERAL VASCULAR CATHETERIZATION  07/13/2015   Procedure: Peripheral Vascular Atherectomy;  Surgeon: Lorretta Harp, MD;  Location: [place] [facility];  Service: Cardiovascular;;   PERIPHERAL VASCULAR INTERVENTION  04/27/2018   Procedure: PERIPHERAL VASCULAR INTERVENTION;  Surgeon: Lorretta Harp, MD;  Location: [place] [facility];  Service: Cardiovascular;;  right popliteal artery   TONSILLECTOMY       Current Outpatient Medications  Medication Sig Dispense Refill   amLODipine (NORVASC) 5 MG tablet Take 1 tablet (5 mg total) by mouth daily. 60 tablet 2   apixaban (ELIQUIS) 5 MG TABS tablet Take 1 tablet (5 mg total) by mouth 2 (two) times daily. 90 tablet 1   aspirin EC 81 MG tablet Take 1 tablet (81 mg total) by mouth daily. 90 tablet 3   atorvastatin (LIPITOR) 80 MG tablet Take 1 tablet (80 mg total) by mouth daily. 60 tablet 2   carvedilol (COREG) 12.5 MG tablet Take 3 tablets (37.5 mg total) by mouth 2 (two) times daily. 120 tablet 2   Continuous Blood Gluc Sensor (FREESTYLE LIBRE 2 SENSOR) MISC CHECK BLOOD SUGAR FOUR TIMES DAILY 1 each 1   empagliflozin (JARDIANCE) 25 MG TABS tablet Take 1 tablet (25 mg total) by mouth daily before breakfast. 90 tablet 3   fenofibrate 160 MG tablet Take 1 tablet (160 mg total) by mouth daily. 90 tablet 1   glipiZIDE (GLUCOTROL XL) 5 MG 24 hr tablet TAKE 1 TABLET BY MOUTH EVERY DAY WITH BREAKFAST 90 tablet 0   glucose blood (ONETOUCH ULTRA) test strip CHECK BLOOD SUGAR TWICE A DAY 50 strip 15   Insulin Pen Needle (B-D UF III MINI PEN NEEDLES) 31G X 5 MM MISC USE DAILY WITH VICTOZA 100 each 0   liraglutide (VICTOZA) 18 MG/3ML SOPN INJECT 1.2 MG UNDER THE SKIN ONCE DAILY 6 mL 9   lisinopril-hydrochlorothiazide (ZESTORETIC) 20-25 MG tablet Take 1 tablet by mouth daily. 90 tablet 2   Omega-3 Fatty Acids (FISH OIL) 1000 MG CAPS Take 1,000 mg by mouth 2 (two) times  daily.     pantoprazole (PROTONIX) 40 MG tablet Take 1 tablet (40 mg total) by mouth 2 (two) times daily. 60 tablet 11   pioglitazone (ACTOS) 30 MG tablet TAKE 1 TABLET BY MOUTH EVERY DAY STOP TRADJENTA 90 tablet 2   sildenafil (VIAGRA) 100 MG tablet Take 0.5-1 tablets (50-100 mg total) by mouth daily as needed for erectile dysfunction. 5 tablet 11   vedolizumab (ENTYVIO) 300 MG injection Inject 300 mg as directed See admin instructions. Every 2 months     No current facility-administered medications for this visit.    Allergies:   Penicillins   Social History:  The patient  reports that he quit smoking about 24 years ago. His smoking use included cigarettes. He has a 37.50 pack-year smoking history. He has never used smokeless tobacco. He reports current alcohol use of about 1.0 standard drink per week. He reports  that he does not use drugs.   Family History:  The patient's  family history includes Colon cancer in his maternal grandmother; Colon polyps in his mother; Diabetes in his maternal aunt and mother; Heart disease in his father.    ROS:  Please see the history of present illness.   All other systems are personally reviewed and negative.    PHYSICAL EXAM: VS:  BP 128/70   Pulse (!) 49   Ht 5' 10"  (1.778 m)   Wt 223 lb (101.2 kg)   SpO2 97%   BMI 32.00 kg/m  , BMI Body mass index is 32 kg/m. GEN: Well nourished, well developed, in no acute distress HEENT: normal Neck: no JVD, carotid bruits, or masses Cardiac: RRR; no murmurs, rubs, or gallops,no edema  Respiratory:  clear to auscultation bilaterally, normal work of breathing GI: soft, nontender, nondistended, + BS MS: no deformity or atrophy Skin: warm and dry  Neuro:  Strength and sensation are intact Psych: euthymic mood, full affect  EKG:  EKG is ordered today. The ekg ordered today is personally reviewed and shows sinus bradycardia 49 bpm, PR 212 msec  Echo 03/13/21- EF 60%, moderate biatrial enlargement, mild  AS  Event monitor 02/21/21- AF burden 25%, burden is 56-153 bpm  Recent Labs: 03/02/2021: ALT 28; BUN 29; Creat 2.03; Hemoglobin 11.1; Platelets 251; Potassium 4.3; Sodium 139  personally reviewed   Lipid Panel     Component Value Date/Time   CHOL 133 03/02/2021 0807   CHOL 119 10/07/2016 0843   TRIG 266 (H) 03/02/2021 0807   HDL 30 (L) 03/02/2021 0807   HDL 26 (L) 10/07/2016 0843   CHOLHDL 4.4 03/02/2021 0807   VLDL 47 (H) 11/25/2016 0838   LDLCALC 68 03/02/2021 0807   personally reviewed   Wt Readings from Last 3 Encounters:  04/02/21 223 lb (101.2 kg)  02/28/21 228 lb (103.4 kg)  02/17/21 234 lb 14.4 oz (106.5 kg)     Other studies personally reviewed: Additional studies/ records that were reviewed today include: Dr Kennon Holter notes, prior cath, echo, event monitor, MRI  Review of the above records today demonstrates: as above   ASSESSMENT AND PLAN:  1.  Paroxysmal atrial fibrillation Minimally symtpomatic Therapy is limited by comorbidities, including renal failure. Could consider amiodarone.  I would like to have his liver mass workup complete before we proceed. Continue coreg for now, however we will need to substantially reduce the dose before we consider amiodarone. I would not advise ablation currently.  We could reconsider pending results of his liver mass workup. He is on both ASA and eliquis.  Stop ASA when ok with Dr Gwenlyn Found.  Risks, benefits and potential toxicities for medications prescribed and/or refilled reviewed with patient today.    Follow-up:  AF clinic in 2 months     Signed, Thompson Grayer, MD  04/02/2021 10:41 AM     [facility] [address] [place] [address] [phone] (office) [phone] (fax)

## 2021-04-02 NOTE — Patient Instructions (Addendum)
Medication Instructions:  Your physician recommends that you continue on your current medications as directed. Please refer to the Current Medication list given to you today.  Labwork: None ordered.  Testing/Procedures: None ordered.  Follow-Up: Your physician wants you to follow-up in: 2 month follow up in the Afib Clinic. They will contact you to schedule.    Any Other Special Instructions Will Be Listed Below (If Applicable).  If you need a refill on your cardiac medications before your next appointment, please call your pharmacy.

## 2021-04-03 NOTE — Telephone Encounter (Signed)
[employer] forms received back from provider, forms were faxed to [employer] and [employer] per patient request Patient is aware that forms are completed and were faxed to [employer] and [employer].

## 2021-04-04 ENCOUNTER — Telehealth: Payer: Self-pay | Admitting: *Deleted

## 2021-04-04 NOTE — Telephone Encounter (Signed)
The most recent paperwork has been given to [employer]. Will forward this message to her.

## 2021-04-04 NOTE — Telephone Encounter (Signed)
Nurse Suzi Roots, I saw your message from 8/15 stating that [name] will not complete paperwork because pt is not disabled from a cardiac standpoint, edu pt of this but he states it is for short term and not long term. Does this make a difference? please advise

## 2021-04-04 NOTE — Telephone Encounter (Signed)
Received following message from referral coordinator: IR said they don't do those appts. They referred me to someone else and they said they need an order for the biopsy before they can make an appt.  Please advise.

## 2021-04-04 NOTE — Telephone Encounter (Signed)
Forms were faxed again to [employer] with date needed on forms per [employer].

## 2021-04-05 ENCOUNTER — Encounter (HOSPITAL_COMMUNITY): Payer: Self-pay | Admitting: Radiology

## 2021-04-05 ENCOUNTER — Other Ambulatory Visit: Payer: Self-pay | Admitting: Family Medicine

## 2021-04-05 DIAGNOSIS — R160 Hepatomegaly, not elsewhere classified: Secondary | ICD-10-CM

## 2021-04-05 NOTE — Progress Notes (Signed)
Patient Name  Matthew Chen, Matthew Chen Legal Sex  Male DOB  10/01/1955 SSN  [id number] Address  [address] [place] [address] Phone  [phone] [facility]) *Preferred*  [phone] (Work)  [phone] (Mobile)    RE: US BIOPSY (LIVER) Received: Today Susy Frizzle, MD  Garth Bigness D Yes, he can hold eliquis two days prior to biopsy.        Previous Messages   ----- Message -----  From: Garth Bigness D  Sent: 04/05/2021  11:41 AM EDT  To: Susy Frizzle, MD  Subject: FW: US BIOPSY (LIVER)                           Hi, this patient is on Eliquis, please advise if okay to hold for 2 days prior to biopsy. Thanks  ----- Message -----  From: Garth Bigness D  Sent: 04/05/2021  11:39 AM EDT  To: Ir Procedure Requests  Subject: US BIOPSY (LIVER)                               Procedure:  US BIOPSY (LIVER)   Reason:  liver mass   History:  MR in computer   Provider:  Jenna Luo T   Provider Contact:  [phone]

## 2021-04-06 NOTE — Telephone Encounter (Signed)
US ordered

## 2021-04-17 ENCOUNTER — Telehealth: Payer: Self-pay | Admitting: Gastroenterology

## 2021-04-17 ENCOUNTER — Other Ambulatory Visit (INDEPENDENT_AMBULATORY_CARE_PROVIDER_SITE_OTHER): Payer: 59

## 2021-04-17 ENCOUNTER — Encounter (HOSPITAL_COMMUNITY): Payer: Self-pay | Admitting: Radiology

## 2021-04-17 DIAGNOSIS — K51919 Ulcerative colitis, unspecified with unspecified complications: Secondary | ICD-10-CM | POA: Diagnosis not present

## 2021-04-17 DIAGNOSIS — R197 Diarrhea, unspecified: Secondary | ICD-10-CM | POA: Diagnosis not present

## 2021-04-17 LAB — CBC WITH DIFFERENTIAL/PLATELET
Basophils Absolute: 0.1 10*3/uL (ref 0.0–0.1)
Basophils Relative: 2.2 % (ref 0.0–3.0)
Eosinophils Absolute: 0.2 10*3/uL (ref 0.0–0.7)
Eosinophils Relative: 4.1 % (ref 0.0–5.0)
HCT: 35.8 % — ABNORMAL LOW (ref 39.0–52.0)
Hemoglobin: 11.5 g/dL — ABNORMAL LOW (ref 13.0–17.0)
Lymphocytes Relative: 23.5 % (ref 12.0–46.0)
Lymphs Abs: 1.2 10*3/uL (ref 0.7–4.0)
MCHC: 32.3 g/dL (ref 30.0–36.0)
MCV: 88.8 fl (ref 78.0–100.0)
Monocytes Absolute: 0.7 10*3/uL (ref 0.1–1.0)
Monocytes Relative: 13.6 % — ABNORMAL HIGH (ref 3.0–12.0)
Neutro Abs: 2.9 10*3/uL (ref 1.4–7.7)
Neutrophils Relative %: 56.6 % (ref 43.0–77.0)
Platelets: 290 10*3/uL (ref 150.0–400.0)
RBC: 4.03 Mil/uL — ABNORMAL LOW (ref 4.22–5.81)
RDW: 16 % — ABNORMAL HIGH (ref 11.5–15.5)
WBC: 5 10*3/uL (ref 4.0–10.5)

## 2021-04-17 LAB — COMPREHENSIVE METABOLIC PANEL
ALT: 24 U/L (ref 0–53)
AST: 22 U/L (ref 0–37)
Albumin: 4 g/dL (ref 3.5–5.2)
Alkaline Phosphatase: 57 U/L (ref 39–117)
BUN: 26 mg/dL — ABNORMAL HIGH (ref 6–23)
CO2: 23 mEq/L (ref 19–32)
Calcium: 9.2 mg/dL (ref 8.4–10.5)
Chloride: 107 mEq/L (ref 96–112)
Creatinine, Ser: 1.98 mg/dL — ABNORMAL HIGH (ref 0.40–1.50)
GFR: 34.75 mL/min — ABNORMAL LOW (ref >60.00)
Glucose, Bld: 118 mg/dL — ABNORMAL HIGH (ref 70–99)
Potassium: 4.1 mEq/L (ref 3.5–5.1)
Sodium: 139 mEq/L (ref 135–145)
Total Bilirubin: 0.4 mg/dL (ref 0.2–1.2)
Total Protein: 7.2 g/dL (ref 6.0–8.3)

## 2021-04-17 LAB — SEDIMENTATION RATE: Sed Rate: 46 mm/hr — ABNORMAL HIGH (ref 0–20)

## 2021-04-17 LAB — TSH: TSH: 1.42 u[IU]/mL (ref 0.35–5.50)

## 2021-04-17 LAB — C-REACTIVE PROTEIN: CRP: <1 mg/dL (ref 0.5–20.0)

## 2021-04-17 NOTE — Telephone Encounter (Signed)
Patient notified.  He will come for labs today or tomorrow.

## 2021-04-17 NOTE — Telephone Encounter (Signed)
Inbound call from patient. States he started have extreme diarrhea 8/25. Says it feels like it is running through him once eat, go to bathroom all throughout the night, and cramps in lower stomach. Have tried imodium but it didn't really help. Would like to know if something can be called in to help?

## 2021-04-17 NOTE — Progress Notes (Signed)
Patient Name  Matthew Chen, Matthew Chen Legal Sex  Male DOB  [birth year] SSN  [id number] Address  [address] [address] Phone  [phone] [facility]) *Preferred*  [phone] (Work)  [phone] (Mobile)    RE: US Biopsy Liver Received: Today Arne Cleveland, MD  [place], [name]   [place] core R liver lesion  Prob hcca try to get some nl liver in the cores too   DDH        Previous Messages   ----- Message -----  From: Garth Bigness D  Sent: 04/17/2021   9:51 AM EDT  To: Ir Procedure Requests  Subject: US Biopsy Liver                                 Not sure if [name] submitted this or not but Office is calling. Calvert Cantor is out of office today.   Procedure:  US BIOPSY (LIVER)   Reason:  liver mass   History:  MR in computer   Provider:  Jenna Luo T   Provider Contact:  [phone]

## 2021-04-17 NOTE — Telephone Encounter (Signed)
Left message for patient to call back  

## 2021-04-17 NOTE — Telephone Encounter (Signed)
Patient with a hx of UC maintained on Entyvio. His last infusion was on 8/22.  He reports diarrhea that started about 1 week prior to last infusion with 4-5 episodes a day.  Improved to 2-3 times a day after the infusion, but for the last week and a half is having 6-8 watery Bms daily.  He is c/o lower abdominal pain, excess gas, and pressure.  He denies fever, rectal bleeding.  He recently traveled to [place], but symptoms were present prior to travel.  He stayed at his sisters home in [place].  He has had no recent antibiotic use. Please advise

## 2021-04-17 NOTE — Telephone Encounter (Signed)
CMP, CBC, TSH, CRP, ESR, fecal calprotectin, GI stool profile

## 2021-04-18 ENCOUNTER — Other Ambulatory Visit: Payer: 59

## 2021-04-18 ENCOUNTER — Telehealth: Payer: Self-pay

## 2021-04-18 DIAGNOSIS — K51919 Ulcerative colitis, unspecified with unspecified complications: Secondary | ICD-10-CM

## 2021-04-18 DIAGNOSIS — R197 Diarrhea, unspecified: Secondary | ICD-10-CM

## 2021-04-18 NOTE — Telephone Encounter (Signed)
Pt called in asking if there was any other less expensive med that he could get in place of these two meds empagliflozin (JARDIANCE) 25 MG, liraglutide (VICTOZA) 18 MG/3ML. Pt would like a cb about this please.   Cb#: [phone]

## 2021-04-18 NOTE — Telephone Encounter (Signed)
Please advise 

## 2021-04-19 NOTE — Telephone Encounter (Signed)
  Call placed to patient and patient made aware.   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   States that he is also going on Medicare.   Jardiance, Victoza and Eliquis are very expensive. States that he will keep taking Eliquis, but will need to change his other medications.

## 2021-04-20 NOTE — Telephone Encounter (Signed)
Call placed to patient and patient made aware.   Patient states that he would like to wait until Medicare is active to change medications.   Samples given.

## 2021-04-21 LAB — GI PROFILE, STOOL, PCR
Adenovirus F 40/41: NOT DETECTED
Astrovirus: NOT DETECTED
C difficile toxin A/B: DETECTED — AB
Campylobacter: NOT DETECTED
Cryptosporidium: NOT DETECTED
Cyclospora cayetanensis: NOT DETECTED
Entamoeba histolytica: NOT DETECTED
Enteroaggregative E coli: NOT DETECTED
Enteropathogenic E coli: DETECTED — AB
Enterotoxigenic E coli: NOT DETECTED
Giardia lamblia: NOT DETECTED
Norovirus GI/GII: NOT DETECTED
Plesiomonas shigelloides: NOT DETECTED
Rotavirus A: NOT DETECTED
Salmonella: NOT DETECTED
Sapovirus: NOT DETECTED
Shiga-toxin-producing E coli: NOT DETECTED
Shigella/Enteroinvasive E coli: NOT DETECTED
Vibrio cholerae: NOT DETECTED
Vibrio: NOT DETECTED
Yersinia enterocolitica: NOT DETECTED

## 2021-04-21 LAB — CALPROTECTIN, FECAL: Calprotectin, Fecal: 94 ug/g (ref 0–120)

## 2021-04-23 ENCOUNTER — Other Ambulatory Visit (HOSPITAL_COMMUNITY): Payer: Self-pay | Admitting: Physician Assistant

## 2021-04-23 ENCOUNTER — Other Ambulatory Visit: Payer: Self-pay | Admitting: Internal Medicine

## 2021-04-23 ENCOUNTER — Other Ambulatory Visit: Payer: Self-pay

## 2021-04-23 ENCOUNTER — Other Ambulatory Visit: Payer: Self-pay | Admitting: Radiology

## 2021-04-23 ENCOUNTER — Other Ambulatory Visit: Payer: Self-pay | Admitting: Student

## 2021-04-23 MED ORDER — DIFICID 200 MG PO TABS: 200.0000 mg | ORAL_TABLET | Freq: Two times a day (BID) | ORAL | 0 refills | Status: DC

## 2021-04-23 MED ORDER — CLARITHROMYCIN 500 MG PO TABS: 500.0000 mg | ORAL_TABLET | Freq: Two times a day (BID) | ORAL | 0 refills | Status: DC

## 2021-04-23 MED ORDER — SACCHAROMYCES BOULARDII 250 MG PO CAPS: 250.0000 mg | ORAL_CAPSULE | Freq: Two times a day (BID) | ORAL | 1 refills | Status: DC

## 2021-04-23 NOTE — Addendum Note (Signed)
Addended by: Marlon Pel on: 04/23/2021 10:33 AM   Modules accepted: Orders

## 2021-04-24 ENCOUNTER — Ambulatory Visit (HOSPITAL_COMMUNITY)
Admission: RE | Admit: 2021-04-24 | Discharge: 2021-04-24 | Disposition: A | Discharge status: Home / Self Care | Payer: 59 | Source: Ambulatory Visit | Attending: Family Medicine | Admitting: Family Medicine

## 2021-04-24 ENCOUNTER — Other Ambulatory Visit: Payer: Self-pay

## 2021-04-24 ENCOUNTER — Encounter (HOSPITAL_COMMUNITY): Payer: Self-pay

## 2021-04-24 DIAGNOSIS — Z7982 Long term (current) use of aspirin: Secondary | ICD-10-CM | POA: Insufficient documentation

## 2021-04-24 DIAGNOSIS — Z79899 Other long term (current) drug therapy: Secondary | ICD-10-CM | POA: Insufficient documentation

## 2021-04-24 DIAGNOSIS — R160 Hepatomegaly, not elsewhere classified: Secondary | ICD-10-CM

## 2021-04-24 DIAGNOSIS — N183 Chronic kidney disease, stage 3 unspecified: Secondary | ICD-10-CM | POA: Diagnosis not present

## 2021-04-24 DIAGNOSIS — E1122 Type 2 diabetes mellitus with diabetic chronic kidney disease: Secondary | ICD-10-CM | POA: Insufficient documentation

## 2021-04-24 DIAGNOSIS — Z7901 Long term (current) use of anticoagulants: Secondary | ICD-10-CM | POA: Insufficient documentation

## 2021-04-24 DIAGNOSIS — I129 Hypertensive chronic kidney disease with stage 1 through stage 4 chronic kidney disease, or unspecified chronic kidney disease: Secondary | ICD-10-CM | POA: Diagnosis not present

## 2021-04-24 DIAGNOSIS — K769 Liver disease, unspecified: Secondary | ICD-10-CM | POA: Diagnosis present

## 2021-04-24 DIAGNOSIS — C220 Liver cell carcinoma: Secondary | ICD-10-CM | POA: Diagnosis not present

## 2021-04-24 DIAGNOSIS — E785 Hyperlipidemia, unspecified: Secondary | ICD-10-CM | POA: Diagnosis not present

## 2021-04-24 DIAGNOSIS — Z794 Long term (current) use of insulin: Secondary | ICD-10-CM | POA: Insufficient documentation

## 2021-04-24 DIAGNOSIS — Z7984 Long term (current) use of oral hypoglycemic drugs: Secondary | ICD-10-CM | POA: Insufficient documentation

## 2021-04-24 DIAGNOSIS — C787 Secondary malignant neoplasm of liver and intrahepatic bile duct: Secondary | ICD-10-CM | POA: Diagnosis not present

## 2021-04-24 LAB — CBC
HCT: 36.7 % — ABNORMAL LOW (ref 39.0–52.0)
Hemoglobin: 11.6 g/dL — ABNORMAL LOW (ref 13.0–17.0)
MCH: 28.9 pg (ref 26.0–34.0)
MCHC: 31.6 g/dL (ref 30.0–36.0)
MCV: 91.3 fL (ref 80.0–100.0)
Platelets: 278 10*3/uL (ref 150–400)
RBC: 4.02 MIL/uL — ABNORMAL LOW (ref 4.22–5.81)
RDW: 15.5 % (ref 11.5–15.5)
WBC: 5.2 10*3/uL (ref 4.0–10.5)
nRBC: 0 % (ref 0.0–0.2)

## 2021-04-24 LAB — GLUCOSE, CAPILLARY
Glucose-Capillary: 139 mg/dL — ABNORMAL HIGH (ref 70–99)
Glucose-Capillary: 117 mg/dL — ABNORMAL HIGH (ref 70–99)

## 2021-04-24 LAB — PROTIME-INR
INR: 1 (ref 0.8–1.2)
Prothrombin Time: 13.4 seconds (ref 11.4–15.2)

## 2021-04-24 MED ORDER — SODIUM CHLORIDE 0.9 % IV SOLN: INTRAVENOUS | Status: DC

## 2021-04-24 MED ORDER — MIDAZOLAM HCL 2 MG/2ML IJ SOLN
INTRAMUSCULAR | Status: AC
  Filled 2021-04-24: qty 2

## 2021-04-24 MED ORDER — LIDOCAINE-EPINEPHRINE 1 %-1:100000 IJ SOLN
INTRAMUSCULAR | Status: AC
  Filled 2021-04-24: qty 1

## 2021-04-24 MED ORDER — GELATIN ABSORBABLE 12-7 MM EX MISC
CUTANEOUS | Status: AC
  Filled 2021-04-24: qty 1

## 2021-04-24 MED ORDER — SODIUM CHLORIDE 0.9 % IV SOLN
INTRAVENOUS | Status: DC | PRN
  Administered 2021-04-24: 10 mL/h via INTRAVENOUS

## 2021-04-24 MED ORDER — FENTANYL CITRATE (PF) 100 MCG/2ML IJ SOLN
INTRAMUSCULAR | Status: AC
  Filled 2021-04-24: qty 2

## 2021-04-24 MED ORDER — MIDAZOLAM HCL 2 MG/2ML IJ SOLN
INTRAMUSCULAR | Status: DC | PRN
  Administered 2021-04-24: 1 mg via INTRAVENOUS

## 2021-04-24 MED ORDER — FENTANYL CITRATE (PF) 100 MCG/2ML IJ SOLN
INTRAMUSCULAR | Status: DC | PRN
  Administered 2021-04-24: 50 ug via INTRAVENOUS

## 2021-04-24 NOTE — Procedures (Signed)
Pre Procedure Dx: Liver lesion Post Procedural Dx: Same  Technically successful US guided biopsy of indeterminate lesion within the right lobe of the liver.   EBL: None  No immediate complications.   Ronny Bacon, MD Pager #: [phone]

## 2021-04-24 NOTE — H&P (Signed)
Chief Complaint: Patient was seen in consultation today for liver lesion biopsy at the request of Pickard,Warren T  Referring Physician(s): Pickard,Warren T  Supervising Physician: Sandi Mariscal  Patient Status: [facility] - Out-pt  History of Present Illness: Matthew Chen is a 65 y.o. male   CT performed 01/30/21 to evaluate thoracic aneurysm-- discovered incidental finding of liver lesion MR 03/22/21: IMPRESSION: 1. There is an arterially hyperenhancing subcapsular mass of the anterior inferior right lobe of the liver, hepatic segment VI, measuring approximately 7.0 x 5.1 cm. This does not clearly demonstrate capsular enhancement but does evidence some internal washout on delayed phase imaging. Findings are most consistent with hepatocellular carcinoma. Although LI-RADS does not strictly apply in the absence of an established diagnosis of cirrhosis or chronic hepatitis B, this lesion meets criteria for LI-RADS category 5 (definitely hepatocellular carcinoma).  Now scheduled for biopsy of same He denies wt loss; pain or N/V   Past Medical History:  Diagnosis Date   Adenomatous colon polyp 03/2008   Anginal pain ([place])    Aortic aneurysm, thoracic ([place]) 09/09/2016   4.4 cm by echo 05/2015   Arthritis    "joints in my fingers ache" (07/13/2015)   Bicuspid aortic valve 09/09/2016   Cataract 2019   bilateral eyes   CKD (chronic kidney disease), stage III (HCC)    Clotting disorder ([place])    on plavix for legs   Coronary artery disease    Diabetes mellitus without complication ([place])    Hemorrhoids    Hyperlipidemia    Hypertension    Peripheral arterial disease ([place])    a. dopppler 05/29/2015 revealed high-grade right popliteal and distal left SFA disease b. LE angio 06/28/2015 revealing high grade calcific dx with distal L SFA and R popliteal artery s/p diamondback orbital rotational atherectomy, PTA of L SFA  c. 07/13/2015 diamondback orbital rotational atherectomy and drug  eluting balloon angioplasty of R popliteal artery (P2 segment) using distal protection    Type II diabetes mellitus ([place])    Ulcerative colitis     Past Surgical History:  Procedure Laterality Date   ABDOMINAL AORTOGRAM W/LOWER EXTREMITY N/A 04/27/2018   Procedure: ABDOMINAL AORTOGRAM W/LOWER EXTREMITY;  Surgeon: Lorretta Harp, MD;  Location: [place] [facility];  Service: Cardiovascular;  Laterality: N/A;   COLONOSCOPY W/ POLYPECTOMY  X 4-5   CORONARY ATHERECTOMY N/A 02/15/2021   Procedure: CORONARY ATHERECTOMY;  Surgeon: Lorretta Harp, MD;  Location: [place] [facility];  Service: Cardiovascular;  Laterality: N/A;  aborted    CORONARY BALLOON ANGIOPLASTY N/A 02/15/2021   Procedure: CORONARY BALLOON ANGIOPLASTY;  Surgeon: Lorretta Harp, MD;  Location: [place] [facility];  Service: Cardiovascular;  Laterality: N/A;   COSMETIC SURGERY  < 2000   "removed birthmark from top of my head"   LEFT HEART CATH AND CORONARY ANGIOGRAPHY N/A 02/08/2021   Procedure: LEFT HEART CATH AND CORONARY ANGIOGRAPHY;  Surgeon: Lorretta Harp, MD;  Location: [place] [facility];  Service: Cardiovascular;  Laterality: N/A;   PERIPHERAL VASCULAR ATHERECTOMY  04/27/2018   Procedure: PERIPHERAL VASCULAR ATHERECTOMY;  Surgeon: Lorretta Harp, MD;  Location: [place] [facility];  Service: Cardiovascular;;  right popliteal artery   PERIPHERAL VASCULAR CATHETERIZATION N/A 06/29/2015   Procedure: Lower Extremity Angiography;  Surgeon: Lorretta Harp, MD;  Location: [place] [facility];  Service: Cardiovascular;  Laterality: N/A;   PERIPHERAL VASCULAR CATHETERIZATION N/A 07/13/2015   Procedure: Lower Extremity Angiography;  Surgeon: Lorretta Harp, MD;  Location: [place] [facility];  Service: Cardiovascular;  Laterality: N/A;   PERIPHERAL VASCULAR CATHETERIZATION  07/13/2015   Procedure: Peripheral Vascular Atherectomy;  Surgeon: Lorretta Harp, MD;  Location: [place] [facility];  Service: Cardiovascular;;    PERIPHERAL VASCULAR INTERVENTION  04/27/2018   Procedure: PERIPHERAL VASCULAR INTERVENTION;  Surgeon: Lorretta Harp, MD;  Location: [place] [facility];  Service: Cardiovascular;;  right popliteal artery   TONSILLECTOMY      Allergies: Penicillins  Medications: Prior to Admission medications   Medication Sig Start Date End Date Taking? Authorizing Provider  amLODipine (NORVASC) 5 MG tablet Take 1 tablet (5 mg total) by mouth daily. 03/19/21  Yes Susy Frizzle, MD  apixaban (ELIQUIS) 5 MG TABS tablet Take 1 tablet (5 mg total) by mouth 2 (two) times daily. 03/19/21  Yes Susy Frizzle, MD  aspirin EC 81 MG tablet Take 1 tablet (81 mg total) by mouth daily. 06/01/15  Yes Skeet Latch, MD  atorvastatin (LIPITOR) 80 MG tablet Take 1 tablet (80 mg total) by mouth daily. 02/18/21  Yes Kathyrn Drown D, NP  carvedilol (COREG) 12.5 MG tablet Take 3 tablets (37.5 mg total) by mouth 2 (two) times daily. 03/19/21  Yes Susy Frizzle, MD  empagliflozin (JARDIANCE) 25 MG TABS tablet Take 1 tablet (25 mg total) by mouth daily before breakfast. 03/19/21  Yes Susy Frizzle, MD  fenofibrate 160 MG tablet Take 1 tablet (160 mg total) by mouth daily. 03/19/21  Yes Susy Frizzle, MD  glipiZIDE (GLUCOTROL XL) 5 MG 24 hr tablet TAKE 1 TABLET BY MOUTH EVERY DAY WITH BREAKFAST 03/28/21  Yes Susy Frizzle, MD  liraglutide (VICTOZA) 18 MG/3ML SOPN INJECT 1.2 MG UNDER THE SKIN ONCE DAILY 03/19/21  Yes Susy Frizzle, MD  lisinopril-hydrochlorothiazide (ZESTORETIC) 20-25 MG tablet Take 1 tablet by mouth daily. 03/19/21  Yes Susy Frizzle, MD  loperamide (IMODIUM A-D) 2 MG tablet Take 4 mg by mouth 4 (four) times daily as needed for diarrhea or loose stools.   Yes [provider]  Omega-3 Fatty Acids (FISH OIL) 1000 MG CAPS Take 1,000 mg by mouth 2 (two) times daily.   Yes [provider]  pantoprazole (PROTONIX) 40 MG tablet Take 1 tablet (40 mg total) by mouth 2 (two) times daily.  03/19/21  Yes Susy Frizzle, MD  pioglitazone (ACTOS) 30 MG tablet TAKE 1 TABLET BY MOUTH EVERY DAY STOP TRADJENTA 03/19/21  Yes Susy Frizzle, MD  vedolizumab (ENTYVIO) 300 MG injection Inject 300 mg as directed See admin instructions. Every 2 months   Yes [provider]  acetaminophen (TYLENOL) 500 MG tablet Take 1,000 mg by mouth every 6 (six) hours as needed for moderate pain or headache.    [provider]  clarithromycin (BIAXIN) 500 MG tablet Take 1 tablet (500 mg total) by mouth 2 (two) times daily. 04/23/21   [name], MD  Continuous Blood Gluc Sensor (FREESTYLE LIBRE 2 SENSOR) MISC CHECK BLOOD SUGAR FOUR TIMES DAILY 03/28/21   Susy Frizzle, MD  fidaxomicin (DIFICID) 200 MG TABS tablet Take 1 tablet (200 mg total) by mouth 2 (two) times daily. 04/23/21   [name], MD  glucose blood (ONETOUCH ULTRA) test strip CHECK BLOOD SUGAR TWICE A DAY 03/19/21   Susy Frizzle, MD  Insulin Pen Needle (B-D UF III MINI PEN NEEDLES) 31G X 5 MM MISC USE DAILY WITH VICTOZA 03/19/21   Susy Frizzle, MD  saccharomyces  boulardii (FLORASTOR) 250 MG capsule Take 1 capsule (250 mg total) by mouth 2 (two) times daily. 04/23/21 06/04/21  [name], MD     Family History  Problem Relation Age of Onset   Colon cancer Maternal Grandmother        in her 13's   Colon polyps Mother        in her 19's   Diabetes Mother    Heart disease Father    Diabetes Maternal Aunt        Aunts and Uncles   Esophageal cancer Neg Hx    Stomach cancer Neg Hx    Rectal cancer Neg Hx     Social History   Socioeconomic History   Marital status: Married    Spouse name: Grove Defina   Number of children: 2   Years of education: Not on file   Highest education level: Not on file  Occupational History   Occupation: Truck Education administrator: Counsellor of Librarian, academic  Tobacco Use   Smoking status: Former    Packs/day: 1.50    Years: 25.00    Pack years: 37.50    Types:  Cigarettes    Quit date: 08/12/1996    Years since quitting: 24.7   Smokeless tobacco: Never  Vaping Use   Vaping Use: Never used  Substance and Sexual Activity   Alcohol use: Yes    Alcohol/week: 1.0 standard drink    Types: 1 Cans of beer per week    Comment: social use   Drug use: No   Sexual activity: Yes  Other Topics Concern   Not on file  Social History Narrative   ** Merged History Encounter **       Social Determinants of Health   Financial Resource Strain: Not on file  Food Insecurity: Not on file  Transportation Needs: Not on file  Physical Activity: Not on file  Stress: Not on file  Social Connections: Not on file    Review of Systems: A 12 point ROS discussed and pertinent positives are indicated in the HPI above.  All other systems are negative.  Review of Systems  Constitutional:  Negative for activity change, appetite change, fatigue and unexpected weight change.  Respiratory:  Negative for cough and shortness of breath.   Cardiovascular:  Negative for leg swelling.  Gastrointestinal:  Negative for abdominal pain, nausea and vomiting.  Musculoskeletal:  Negative for back pain.  Psychiatric/Behavioral:  Negative for behavioral problems and confusion.    Vital Signs: BP (!) 124/57   Pulse (!) 56   Temp 97.6 F (36.4 C) (Oral)   Resp 18   Ht 5' 10"  (1.778 m)   Wt 230 lb (104.3 kg)   SpO2 96%   BMI 33.00 kg/m   Physical Exam Vitals reviewed.  HENT:     Mouth/Throat:     Mouth: Mucous membranes are moist.  Cardiovascular:     Rate and Rhythm: Normal rate and regular rhythm.  Pulmonary:     Effort: Pulmonary effort is normal.     Breath sounds: Normal breath sounds.  Abdominal:     Palpations: Abdomen is soft.     Tenderness: There is no abdominal tenderness.  Musculoskeletal:        General: Normal range of motion.  Skin:    General: Skin is warm.  Neurological:     Mental Status: He is alert and oriented to person, place, and time.   Psychiatric:  Behavior: Behavior normal.    Imaging: No results found.  Labs:  CBC: Recent Labs    02/15/21 0606 02/16/21 0055 03/02/21 0807 04/17/21 1401  WBC 3.4* 5.3 4.5 5.0  HGB 10.8* 10.0* 11.1* 11.5*  HCT 34.1* 31.2* 34.4* 35.8*  PLT 224 207 251 290.0    COAGS: No results for input(s): INR, APTT in the last 8760 hours.  BMP: Recent Labs    04/25/20 1205 11/20/20 0830 01/17/21 1454 02/15/21 0606 02/16/21 0055 03/02/21 0807 04/17/21 1401  NA 136 140   < > 140 136 139 139  K 4.7 4.7   < > 4.2 4.4 4.3 4.1  CL 102 104   < > 106 103 104 107  CO2 24 25   < > 27 26 27 23   GLUCOSE 102* 148*   < > 194* 173* 174* 118*  BUN 21 25   < > 26* 23 29* 26*  CALCIUM 9.7 9.4   < > 9.1 8.7* 9.5 9.2  CREATININE 1.64* 1.67*   < > 1.88* 1.79* 2.03* 1.98*  GFRNONAA 44* 42*  --  39* 42*  --   --   GFRAA 50* 49*  --   --   --   --   --    < > = values in this interval not displayed.    LIVER FUNCTION TESTS: Recent Labs    04/25/20 1205 11/20/20 0830 03/02/21 0807 04/17/21 1401  BILITOT 0.6 0.5 0.5 0.4  AST 24 24 24 22   ALT 28 29 28 24   ALKPHOS  --   --   --  57  PROT 6.9 6.7 6.8 7.2  ALBUMIN  --   --   --  4.0    TUMOR MARKERS: No results for input(s): AFPTM, CEA, CA199, CHROMGRNA in the last 8760 hours.  Assessment and Plan:  Incidentally found liver lesion Scheduled now for biopsy Risks and benefits of liver lesion biopsy was discussed with the patient and/or patient's family including, but not limited to bleeding, infection, damage to adjacent structures or low yield requiring additional tests.  All of the questions were answered and there is agreement to proceed. Consent signed and in chart.   Thank you for this interesting consult.  I greatly enjoyed meeting Matthew Chen and look forward to participating in their care.  A copy of this report was sent to the requesting provider on this date.  Electronically Signed: Lavonia Drafts, [name] 04/24/2021,  7:07 AM   I spent a total of  30 Minutes   in face to face in clinical consultation, greater than 50% of which was counseling/coordinating care for liver lesion bx

## 2021-04-24 NOTE — Progress Notes (Signed)
Pt ambulated without difficulty or bleeding.   Discharged home with his wife who will drive and stay with pt x 24 hrs.

## 2021-04-25 ENCOUNTER — Telehealth: Payer: Self-pay | Admitting: Gastroenterology

## 2021-04-25 LAB — SURGICAL PATHOLOGY

## 2021-04-25 NOTE — Telephone Encounter (Signed)
Patient called states the Florastor medication is costing him $76.00 asking if there is an alternative.

## 2021-04-25 NOTE — Telephone Encounter (Signed)
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 Patient states he understands and will go pick up the medication from the pharmacy and start.

## 2021-04-27 ENCOUNTER — Telehealth: Payer: Self-pay | Admitting: Family Medicine

## 2021-04-27 ENCOUNTER — Telehealth: Payer: Self-pay | Admitting: *Deleted

## 2021-04-27 ENCOUNTER — Other Ambulatory Visit: Payer: Self-pay | Admitting: Family Medicine

## 2021-04-27 DIAGNOSIS — C220 Liver cell carcinoma: Secondary | ICD-10-CM

## 2021-04-27 NOTE — Telephone Encounter (Signed)
Received call from patient.   He has questions about liver biopsy.   Please advise.

## 2021-04-27 NOTE — Telephone Encounter (Signed)
Duplicate

## 2021-04-27 NOTE — Telephone Encounter (Signed)
Patient requested call back from nurse or provider regarding liver biopsy; has questions and would like a call back today before closing. Please advise at [phone].

## 2021-05-01 ENCOUNTER — Other Ambulatory Visit: Payer: Self-pay | Admitting: Cardiology

## 2021-05-02 ENCOUNTER — Encounter: Payer: Self-pay | Admitting: *Deleted

## 2021-05-02 ENCOUNTER — Other Ambulatory Visit: Payer: Self-pay | Admitting: *Deleted

## 2021-05-02 NOTE — Progress Notes (Unsigned)
Surgical

## 2021-05-03 DIAGNOSIS — C220 Liver cell carcinoma: Secondary | ICD-10-CM | POA: Insufficient documentation

## 2021-05-03 DIAGNOSIS — N183 Chronic kidney disease, stage 3 unspecified: Secondary | ICD-10-CM | POA: Diagnosis present

## 2021-05-04 ENCOUNTER — Telehealth: Payer: Self-pay

## 2021-05-04 ENCOUNTER — Other Ambulatory Visit: Payer: Self-pay | Admitting: General Surgery

## 2021-05-04 DIAGNOSIS — C220 Liver cell carcinoma: Secondary | ICD-10-CM

## 2021-05-04 NOTE — Telephone Encounter (Signed)
   [facility] Pre-operative Risk Assessment    Patient Name: Matthew Chen  DOB: 03-19-1956 MRN: [id number]    Request for surgical clearance:  What type of surgery is being performed  Right Hepatectomy  When is this surgery scheduled TBD  What type of clearance is required Both  Are there any medications that need to be held prior to surgery and how long  Eliquis  Practice name and name of physician performing surgery [facility]  Dr.Byerly  What is the office phone number [phone]   7.   What is the office fax number        [phone]  8.   Anesthesia type  General   Kathyrn Lass 05/04/2021, 5:03 PM  _________________________________________________________________   (provider comments below)

## 2021-05-04 NOTE — Telephone Encounter (Signed)
Clinical pharmacist to review Eliquis

## 2021-05-07 ENCOUNTER — Other Ambulatory Visit: Payer: Self-pay | Admitting: Cardiovascular Disease

## 2021-05-08 ENCOUNTER — Other Ambulatory Visit: Payer: Self-pay

## 2021-05-08 ENCOUNTER — Ambulatory Visit
Admission: RE | Admit: 2021-05-08 | Discharge: 2021-05-08 | Disposition: A | Discharge status: Home / Self Care | Payer: 59 | Source: Ambulatory Visit | Attending: General Surgery | Admitting: General Surgery

## 2021-05-08 ENCOUNTER — Encounter: Payer: Self-pay | Admitting: *Deleted

## 2021-05-08 DIAGNOSIS — C220 Liver cell carcinoma: Secondary | ICD-10-CM

## 2021-05-08 HISTORY — PX: IR RADIOLOGIST EVAL & MGMT: IMG5224

## 2021-05-08 NOTE — Telephone Encounter (Signed)
   Name: Matthew Chen  DOB: 23-Feb-1956  MRN: [id number]  Primary Cardiologist: [name]  Chart reviewed as part of pre-operative protocol coverage. Because of [name]'s past medical history and time since last visit, he will require a follow-up visit in order to better assess preoperative cardiovascular risk.  Pre-op covering staff: - Please schedule appointment and call patient to inform them. If patient already had an upcoming appointment within acceptable timeframe, please add "pre-op clearance" to the appointment notes so provider is aware. - Please contact requesting surgeon's office via preferred method (i.e, phone, fax) to inform them of need for appointment prior to surgery.  If applicable, this message will also be routed to pharmacy pool and/or primary cardiologist for input on holding anticoagulant/antiplatelet agent as requested below so that this information is available to the clearing provider at time of patient's appointment.   Kathyrn Drown, NP  05/08/2021, 9:12 AM

## 2021-05-08 NOTE — Telephone Encounter (Signed)
Patient with diagnosis of atrial fibrillation on Eliquis for anticoagulation.    Procedure: right hepatectomy Date of procedure: TBD   CHA2DS2-VASc Score = 4   This indicates a 4.8% annual risk of stroke. The patient's score is based upon: CHF History: 0 HTN History: 1 Diabetes History: 1 Stroke History: 0 Vascular Disease History: 1 Age Score: 1 Gender Score: 0   CrCl 44 (using adjusted body weight) Platelet count 278  Per office protocol, patient can hold Eliquis for 2 days prior to procedure.   Patient will not need bridging with Lovenox (enoxaparin) around procedure.  Patient should restart Eliquis on the day after, at discretion of procedure MD

## 2021-05-08 NOTE — Telephone Encounter (Signed)
Spoke with patient who is agreeable to be seen in the office by Sande Rives on 10/6 at 8:45 am. Pt thanked me for the call.  Appointment notes have been updated and will route clearance to requesting provider's office via fax. Will remove from the Pre-op callback pool.

## 2021-05-08 NOTE — Consult Note (Signed)
Chief Complaint: Patient was seen in consultation today for hepatocellular carcinoma  Referring Physician(s): Byerly,Faera  History of Present Illness: Matthew Chen is a 65 y.o. 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  The mass was incidentally noted on CTA chest on 01/30/21 performed for thoracic aortic aneurysm workup, further characterized as a LIRADS 5 on abdominal MRI on 03/22/21, and pathologically confirmed as hepatocellular carcinoma via ultrasound guided biopsy on 04/24/21.    Pertinent past medical history is significant for atherosclerotic disease including coronary and peripheral artery involvement (status post multiple coronary and peripheral endovascular interventions) with associated risk factors including hypertension, hyperlipidemia, and type 2 diabetes.  Additional significant  history includes ulcerative colitis for which he is taking several biologic medications, stage 3 chronic kidney disease, and bicuspid aortic valve.  He denies any history of significant alcohol use, or ever being told he has hepatitis.  He has had great energy level and appetite without any weight loss.  No reported history of jaundice.   He is a former Administrator, recently stopped working due to heart condition and inability to pass DOT physical examination.   Past Medical History:  Diagnosis Date   Adenomatous colon polyp 03/2008   Anginal pain ([place])    Aortic aneurysm, thoracic ([place]) 09/09/2016   4.4 cm by echo 05/2015   Arthritis    "joints in my fingers ache" (07/13/2015)   Bicuspid aortic valve 09/09/2016   Cataract 2019   bilateral eyes   CKD (chronic kidney disease), stage III (HCC)    Clotting disorder (HCC)    on plavix for legs   Coronary artery disease    Diabetes mellitus without complication ([place])    Hemorrhoids     Hyperlipidemia    Hypertension    Peripheral arterial disease ([place])    a. dopppler 05/29/2015 revealed high-grade right popliteal and distal left SFA disease b. LE angio 06/28/2015 revealing high grade calcific dx with distal L SFA and R popliteal artery s/p diamondback orbital rotational atherectomy, PTA of L SFA  c. 07/13/2015 diamondback orbital rotational atherectomy and drug eluting balloon angioplasty of R popliteal artery (P2 segment) using distal protection    Type II diabetes mellitus ([place])    Ulcerative colitis     Past Surgical History:  Procedure Laterality Date   ABDOMINAL AORTOGRAM W/LOWER EXTREMITY N/A 04/27/2018   Procedure: ABDOMINAL AORTOGRAM W/LOWER EXTREMITY;  Surgeon: Lorretta Harp, MD;  Location: [facility];  Service: Cardiovascular;  Laterality: N/A;   COLONOSCOPY W/ POLYPECTOMY  X 4-5   CORONARY ATHERECTOMY N/A 02/15/2021   Procedure: CORONARY ATHERECTOMY;  Surgeon: Lorretta Harp, MD;  Location: [facility];  Service: Cardiovascular;  Laterality: N/A;  aborted    CORONARY BALLOON ANGIOPLASTY N/A 02/15/2021   Procedure: CORONARY BALLOON ANGIOPLASTY;  Surgeon: Lorretta Harp, MD;  Location: [facility];  Service: Cardiovascular;  Laterality: N/A;   COSMETIC SURGERY  < 2000   "removed birthmark from top of my head"   LEFT HEART CATH AND CORONARY ANGIOGRAPHY N/A 02/08/2021   Procedure: LEFT HEART CATH AND CORONARY ANGIOGRAPHY;  Surgeon: Lorretta Harp, MD;  Location: [facility];  Service: Cardiovascular;  Laterality: N/A;   PERIPHERAL VASCULAR ATHERECTOMY  04/27/2018   Procedure: PERIPHERAL VASCULAR ATHERECTOMY;  Surgeon: Lorretta Harp, MD;  Location: [facility];  Service: Cardiovascular;;  right popliteal artery   PERIPHERAL VASCULAR CATHETERIZATION N/A 06/29/2015   Procedure: Lower Extremity Angiography;  Surgeon: Lorretta Harp, MD;  Location: [facility];  Service: Cardiovascular;  Laterality: N/A;   PERIPHERAL  VASCULAR CATHETERIZATION N/A 07/13/2015   Procedure: Lower Extremity Angiography;  Surgeon: Lorretta Harp, MD;  Location: [facility];  Service: Cardiovascular;  Laterality: N/A;   PERIPHERAL VASCULAR CATHETERIZATION  07/13/2015   Procedure: Peripheral Vascular Atherectomy;  Surgeon: Lorretta Harp, MD;  Location: [facility];  Service: Cardiovascular;;   PERIPHERAL VASCULAR INTERVENTION  04/27/2018   Procedure: PERIPHERAL VASCULAR INTERVENTION;  Surgeon: Lorretta Harp, MD;  Location: [facility];  Service: Cardiovascular;;  right popliteal artery   TONSILLECTOMY      Allergies: Penicillins  Medications: Prior to Admission medications   Medication Sig Start Date End Date Taking? Authorizing Provider  acetaminophen (TYLENOL) 500 MG tablet Take 1,000 mg by mouth every 6 (six) hours as needed for moderate pain or headache.    [provider]  amLODipine (NORVASC) 5 MG tablet Take 1 tablet (5 mg total) by mouth daily. 03/19/21   Susy Frizzle, MD  apixaban (ELIQUIS) 5 MG TABS tablet Take 1 tablet (5 mg total) by mouth 2 (two) times daily. 03/19/21   Susy Frizzle, MD  aspirin EC 81 MG tablet Take 1 tablet (81 mg total) by mouth daily. 06/01/15   Skeet Latch, MD  atorvastatin (LIPITOR) 80 MG tablet Take 1 tablet (80 mg total) by mouth daily. 02/18/21   Kathyrn Drown D, NP  carvedilol (COREG) 12.5 MG tablet Take 3 tablets (37.5 mg total) by mouth 2 (two) times daily. 03/19/21   Susy Frizzle, MD  carvedilol (COREG) 25 MG tablet TAKE 1 TABLET BY MOUTH TWICE A DAY 05/07/21   Lorretta Harp, MD  clarithromycin (BIAXIN) 500 MG tablet Take 1 tablet (500 mg total) by mouth 2 (two) times daily. 04/23/21   [name], MD  Continuous Blood Gluc Sensor (FREESTYLE LIBRE 2 SENSOR) MISC CHECK BLOOD SUGAR FOUR TIMES DAILY 03/28/21   Susy Frizzle, MD  empagliflozin (JARDIANCE) 25 MG TABS tablet Take 1 tablet (25 mg total) by mouth daily before breakfast.  03/19/21   Susy Frizzle, MD  fenofibrate 160 MG tablet Take 1 tablet (160 mg total) by mouth daily. 03/19/21   Susy Frizzle, MD  fidaxomicin (DIFICID) 200 MG TABS tablet Take 1 tablet (200 mg total) by mouth 2 (two) times daily. 04/23/21   [name], MD  glipiZIDE (GLUCOTROL XL) 5 MG 24 hr tablet TAKE 1 TABLET BY MOUTH EVERY DAY WITH BREAKFAST 03/28/21   Susy Frizzle, MD  glucose blood (ONETOUCH ULTRA) test strip CHECK BLOOD SUGAR TWICE A DAY 03/19/21   Susy Frizzle, MD  Insulin Pen Needle (B-D UF III MINI PEN NEEDLES) 31G X 5 MM MISC USE DAILY WITH VICTOZA 03/19/21   Susy Frizzle, MD  liraglutide (VICTOZA) 18 MG/3ML SOPN INJECT 1.2 MG UNDER THE SKIN ONCE DAILY 03/19/21   Susy Frizzle, MD  lisinopril-hydrochlorothiazide (ZESTORETIC) 20-25 MG tablet Take 1 tablet by mouth daily. 03/19/21   Susy Frizzle, MD  loperamide (IMODIUM A-D) 2 MG tablet Take 4 mg by mouth 4 (four) times daily as needed for diarrhea or loose stools.    [provider]  Omega-3 Fatty Acids (FISH OIL) 1000 MG CAPS Take 1,000 mg by mouth 2 (two) times daily.  [provider]  pantoprazole (PROTONIX) 40 MG tablet Take 1 tablet (40 mg total) by mouth 2 (two) times daily. 03/19/21   Susy Frizzle, MD  pioglitazone (ACTOS) 30 MG tablet TAKE 1 TABLET BY MOUTH EVERY DAY STOP TRADJENTA 03/19/21   Susy Frizzle, MD  saccharomyces boulardii (FLORASTOR) 250 MG capsule Take 1 capsule (250 mg total) by mouth 2 (two) times daily. 04/23/21 06/04/21  [name], MD  vedolizumab (ENTYVIO) 300 MG injection Inject 300 mg as directed See admin instructions. 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[employer], academic  Tobacco Use   Smoking status: Former    Packs/day: 1.50    Years: 25.00    Pack years: 37.50    Types: Cigarettes    Quit date: 08/12/1996    Years since quitting: 24.7   Smokeless tobacco: Never  Vaping Use   Vaping Use: Never used  Substance and Sexual Activity   Alcohol use: Yes    Alcohol/week: 1.0 standard drink    Types: 1 Cans of beer per week    Comment: social use   Drug use: No   Sexual activity: Yes  Other Topics Concern   Not on file  Social History Narrative   ** Merged History Encounter **       Social Determinants of Health   Financial Resource Strain: Not on file  Food Insecurity: Not on file  Transportation Needs: Not on file  Physical Activity: Not on file  Stress: Not on file  Social Connections: Not on file    ECOG Status: 0 - Asymptomatic  Review of Systems: A 12 point ROS discussed and pertinent positives are indicated in the HPI above.  All other systems are negative.   Vital Signs: There were no vitals taken for this visit.  No physical examination performed in lieu of virtual visit.  Imaging: MRI abdomen 03/22/21      Labs:  CBC: Recent Labs    02/16/21 0055 03/02/21 0807 04/17/21 1401 04/24/21 0610  WBC 5.3 4.5 5.0 5.2  HGB 10.0* 11.1* 11.5* 11.6*  HCT 31.2* 34.4* 35.8* 36.7*  PLT 207 251 290.0 278    COAGS: Recent Labs    04/24/21 0610  INR 1.0    BMP: Recent Labs    11/20/20 0830 01/17/21 1454 02/15/21 0606 02/16/21 0055 03/02/21 0807 04/17/21 1401  NA 140   < > 140 136 139 139  K 4.7   < > 4.2 4.4 4.3 4.1  CL 104   < > 106 103 104 107  CO2 25   < > 27 26 27 23   GLUCOSE  148*   < > 194* 173*  174* 118*  BUN 25   < > 26* 23 29* 26*  CALCIUM 9.4   < > 9.1 8.7* 9.5 9.2  CREATININE 1.67*   < > 1.88* 1.79* 2.03* 1.98*  GFRNONAA 42*  --  39* 42*  --   --   GFRAA 49*  --   --   --   --   --    < > = values in this interval not displayed.    LIVER FUNCTION TESTS: Recent Labs    11/20/20 0830 03/02/21 0807 04/17/21 1401  BILITOT 0.5 0.5 0.4  AST 24 24 22   ALT 29 28 24   ALKPHOS  --   --  57  PROT 6.7 6.8 7.2  ALBUMIN  --   --  4.0    TUMOR MARKERS: No results for input(s): AFPTM, CEA, CA199, CHROMGRNA in the last 8760 hours.  Marcello Moores: A (5 pts) ALBI: Grade 1 (-2.85 pts)   Assessment and Plan: 65 year old male with 7 cm unifocal right hepatocellular carcinoma in the absence of significant liver disease (Childs Pugh A, ALBI Grade 1).  Given size, hepatectomy is currently not indicated.  Given location and otherwise healthy liver, he would be an excellent candidate for locoregional therapy with radiation segmentectomy.  This could possible allow for safer hepatectomy in the future.    Risks and benefits discussed with the patient including, but not limited to bleeding, infection, vascular injury, post procedural pain, nausea, vomiting and fatigue, contrast induced renal failure, liver failure, radiation injury to the bowel, radiation induced cholecystitis, neutropenia and possible need for additional procedures.  All of the patient's questions were answered, patient is agreeable to proceed.  Plan for hepatic angiogram with Tc-MAA administration at [facility], followed by Y-90 administration approximately 2 weeks later.     Electronically Signed: Suzette Battiest, MD 05/08/2021, 7:51 AM   I spent a total of  40 Minutes  in face to face in clinical consultation, greater than 50% of which was counseling/coordinating care for hepatocellular carcinoma.

## 2021-05-09 ENCOUNTER — Other Ambulatory Visit: Payer: Self-pay

## 2021-05-09 ENCOUNTER — Encounter: Payer: Self-pay | Admitting: Gastroenterology

## 2021-05-09 ENCOUNTER — Ambulatory Visit (INDEPENDENT_AMBULATORY_CARE_PROVIDER_SITE_OTHER): Payer: 59 | Admitting: Gastroenterology

## 2021-05-09 VITALS — BP 100/60 | HR 60 | Ht 70.0 in | Wt 235.0 lb

## 2021-05-09 DIAGNOSIS — C220 Liver cell carcinoma: Secondary | ICD-10-CM | POA: Diagnosis not present

## 2021-05-09 DIAGNOSIS — K2100 Gastro-esophageal reflux disease with esophagitis, without bleeding: Secondary | ICD-10-CM

## 2021-05-09 DIAGNOSIS — K5150 Left sided colitis without complications: Secondary | ICD-10-CM

## 2021-05-09 NOTE — Progress Notes (Addendum)
    History of Present Illness: This is a 65 year old male returning for follow-up of ulcerative colitis.  He noted worsening diarrhea 1 month ago. C diff and EPEC were diagnosed and he was treated with Biaxin and Dificid. His diarrhea has resolved.  Unfortunately a liver mass was discovered incidentally on CT while undergoing evaluation for a thoracic aneurysm.  CT and MR features were consistent with [place].  Biopsy was positive for [place].  He was evaluated by Dr. Barry Dienes on September 23 and I have reviewed her note.  Surgical resection was considered.  He is being referred to IR for consideration of ablation. He has no active GI complaints.  He relates his insurance coverage is changing as he transitions off [place] to a [employer] plan.  His initial look into insurance company shows a high expense related to [place] use.  Current Medications, Allergies, Past Medical History, Past Surgical History, Family History and Social History were reviewed in [employer] record.   Physical Exam: General: Well developed, well nourished, no acute distress Head: Normocephalic and atraumatic Eyes: Sclerae anicteric, EOMI Ears: Normal auditory acuity Mouth: Not examined, mask on during Covid-19 pandemic Lungs: Clear throughout to auscultation Heart: Regular rate and rhythm; no murmurs, rubs or bruits Abdomen: Soft, non tender and non distended. No masses, hepatosplenomegaly or hernias noted. Normal Bowel sounds Rectal: Not done Musculoskeletal: Symmetrical with no gross deformities  Pulses:  Normal pulses noted Extremities: No clubbing, cyanosis, edema or deformities noted Neurological: Alert oriented x 4, grossly nonfocal Psychological:  Alert and cooperative. Normal mood and affect   Assessment and Recommendations:  Left sided UC which is well controlled on Entyvio.  We will work with him and his new health insurance provider to hopefully maintain Entyvio and if not to change to  a reasonable alternative.  He tentatively has a colonoscopy scheduled on November 15 for surveillance of UC.  Depending on his treatment course this may need to be postponed.  REV in 6 weeks.  C diff and EPEC diarrhea resolved following a course of Dificid and Biaxin. GERD with LA Grade B esophagitis and an esophageal stricture. Follow antireflux measures and continue pantoprazole 40 mg po bid.  Hepatocellular carcinoma without typical risk factors for [place].  The tumor measures 7.0 x 5.1 cm on MR and is located in the right lobe, segment VI. LFTs are normal. IR considering Y90 TACE ablation.  Hepatic steatosis.  Long-term carb modified, fat modified weight loss program supervised by his PCP.

## 2021-05-09 NOTE — Progress Notes (Signed)
The proposed treatment discussed in conference is for discussion purpose only and is not a binding recommendation.  The patients have not been physically examined, or presented with their treatment options.  Therefore, final treatment plans cannot be decided.  

## 2021-05-09 NOTE — Patient Instructions (Signed)
Please follow up with Dr. Fuller Plan in 6 weeks.   The [facility] providers would like to encourage you to use [facility] to communicate with providers for non-urgent requests or questions.  Due to long hold times on the telephone, sending your provider a message by [facility] may be a faster and more efficient way to get a response.  Please allow 48 business hours for a response.  Please remember that this is for non-urgent requests.   Thank you for choosing me and [facility].  Pricilla Riffle. Dagoberto Ligas., MD., Mar[facility] Regal

## 2021-05-13 NOTE — Progress Notes (Signed)
Cardiology Office Note:    Date:  05/17/2021   ID:  Matthew Chen, DOB [birth year], MRN [id number]  PCP:  Susy Frizzle, MD  Cardiologist:  Quay Burow, MD  Electrophysiologist:  None   Referring MD: Susy Frizzle, MD   Chief Complaint: pre-op evaluation for hepatectomy   History of Present Illness:    Matthew Chen is a 65 y.o. male with a history of single vessel CAD with high grade stenosis of 1st Diag (PCI attempted but was aborted due to perforation), paroxysmal atrial fibrillation on Eliquis, mild aortic stenosis, PAD s/p multiple interventions, hypertension, hyperlipidemia, type 2 diabetes mellitus, CKD stage III, former tobacco abuse, and recent diagnosis of hepatocellular carcinoma who is followed by [name] and presents today for pre-op evaluation for upcoming right hepatectomy.  Patient has a long history of PAD and has been followed by [name] for this for several years. He underwent diamondback orbital rotation atherectomy with drug eluting balloon angioplasty of the distal left SFA in 06/2015 followed by staged diamondback orbital rotation atherectomy with drug eluding balloon angioplasty of right popliteal artery in 07/2015. He did well after this until 03/2018 when lower extremity dopplers showed high-frequency signal in his right popliteal artery and he was having claudication. Peripheral angiogram in 04/2018 showed 95% stenosis of right popliteal artery with three vessel runoff and 20-40% stenosis of mid left SFA. He underwent diamondback orbital rotation atherectomy, PTA, and stenting of right popliteal lesion and claudication resolved. Most recent dopplers in 11/2020 showed normal ABIs bilaterally with patent right below the knee popliteal artery with no evidence of restenosis and patent distal SFA with evidence of 50-74% restenosis.   At visit in 01/2021, he reported exertional chest pain and dyspnea. Echo and Myoview were ordered for further evaluation. Echo showed  LVEF 60-65% with normal wall motion, mild asymmetric LVH of the basal-septal segment, mild AS, mild MR, and moderate dilatation of the aortic root and ascending aorta measuring 45 mm and 46 mm respectively. Myoview was low risk with a fixed perfusion defect suggestive of artifact. Decision was made to proceed with cardiac catheterization. Cath on 02/08/2021 showed a 99% stenosis of the 1st Diag with no other disease. He was loaded with Plavix and started on antianginals and then brought back to the cath lab on 02/15/2021 for coronary atherectomy and stenting of this lesion. However, procedure was complicated by small perforation in the proximal vessel and wire was show to be "extraluminal." He was treated with balloon tamponade and anticoagulation was reversed with Protamine. He was hospitalized for 2 days and remained hemodynamically stable and serial Echos showed no pericardial effusion. He was found to go into new onset atrial fibrillation during the admission. Outpatient Event Monitor was ordered and confirmed paroxysmal atrial fibrillation. Therefore, Plavix was stopped and he was started on Eliquis and referred to EP.   Of note, chest CTA was also ordered in 01/2021 for further evaluation of ascending aorta and hosed no evidence of aneurysmal dilation of the thoracic aorta but did show diffuse hepatic steatosis with findings suggestive of early cirrhosis and a 7.1cm mass of the liver.  MRI of the liver was then ordered which showed a hyperenhancing subcapsular mass of the anterior inferior right lobe of the liver most consistent with hepatocellular carcinoma. PCP was notified and patient was ultimately referred to GI and Oncology. Liver biopsy confirmed hepatocellular carcinoma.  Last Echo In 8/20220 showed LVEF of 60-65% with normal wall motion, grade 2 diastolic dysfunction,  mild AS, trivial MR, and mild dilatation of the ascending aorta measuring 40 mm. No evidence of pericardial effusion at that  time.  He was seen by Dr. Rayann Heman on 04/02/2021 for further evaluation of paroxysmal atrial fibrillation and was doing well from a cardiac standpoint and was minimally symptomatic from his atrial fibrillation. Options were discussed. Amiodarone and ablation considered after work-up of liver mass.  Patient has upcoming right hepatectomy planned and presents today for pre-op evaluation.  Here alone.  Patient has done well from a cardiac standpoint since last visit.  He has had no recurrent chest pain.  He has been able to walk without any chest pain.  He has chronic shortness of breath with exertion but this is stable.  No orthopnea, PND, lower extremity edema.  No palpitations, lightheadedness, dizziness, syncope.   He is able to complete >4.0 METS without any anginal symptoms. He is able to walk 20 to 30 minutes at a time and walk up an incline and flight of steps.  Right now his surgery has been canceled and the plan is to do radiation instead given that he may be at higher risk for surgery with his cardiac history.  Past Medical History:  Diagnosis Date   Adenomatous colon polyp 03/2008   Anginal pain ([place])    Aortic aneurysm, thoracic 09/09/2016   4.4 cm by echo 05/2015   Arthritis    "joints in my fingers ache" (07/13/2015)   Bicuspid aortic valve 09/09/2016   Cataract 2019   bilateral eyes   CKD (chronic kidney disease), stage III ([place])    Coronary artery disease    Diabetes mellitus without complication ([place])    Hemorrhoids    Hepatocellular carcinoma (HCC)    Hyperlipidemia    Hypertension    Paroxysmal atrial fibrillation ([place])    Peripheral arterial disease ([place])    a. dopppler 05/29/2015 revealed high-grade right popliteal and distal left SFA disease b. LE angio 06/28/2015 revealing high grade calcific dx with distal L SFA and R popliteal artery s/p diamondback orbital rotational atherectomy, PTA of L SFA  c. 07/13/2015 diamondback orbital rotational atherectomy and drug eluting  balloon angioplasty of R popliteal artery (P2 segment) using distal protection    Type II diabetes mellitus ([place])    Ulcerative colitis     Past Surgical History:  Procedure Laterality Date   ABDOMINAL AORTOGRAM W/LOWER EXTREMITY N/A 04/27/2018   Procedure: ABDOMINAL AORTOGRAM W/LOWER EXTREMITY;  Surgeon: Lorretta Harp, MD;  Location: [facility];  Service: Cardiovascular;  Laterality: N/A;   COLONOSCOPY W/ POLYPECTOMY  X 4-5   CORONARY ATHERECTOMY N/A 02/15/2021   Procedure: CORONARY ATHERECTOMY;  Surgeon: Lorretta Harp, MD;  Location: [facility];  Service: Cardiovascular;  Laterality: N/A;  aborted    CORONARY BALLOON ANGIOPLASTY N/A 02/15/2021   Procedure: CORONARY BALLOON ANGIOPLASTY;  Surgeon: Lorretta Harp, MD;  Location: [facility];  Service: Cardiovascular;  Laterality: N/A;   COSMETIC SURGERY  < 2000   "removed birthmark from top of my head"   IR RADIOLOGIST EVAL & MGMT  05/08/2021   LEFT HEART CATH AND CORONARY ANGIOGRAPHY N/A 02/08/2021   Procedure: LEFT HEART CATH AND CORONARY ANGIOGRAPHY;  Surgeon: Lorretta Harp, MD;  Location: [place];  Service: Cardiovascular;  Laterality: N/A;   PERIPHERAL VASCULAR ATHERECTOMY  04/27/2018   Procedure: PERIPHERAL VASCULAR ATHERECTOMY;  Surgeon: Lorretta Harp, MD;  Location: [facility];  Service: Cardiovascular;;  right  popliteal artery   PERIPHERAL VASCULAR CATHETERIZATION N/A 06/29/2015   Procedure: Lower Extremity Angiography;  Surgeon: Lorretta Harp, MD;  Location: [place] [facility];  Service: Cardiovascular;  Laterality: N/A;   PERIPHERAL VASCULAR CATHETERIZATION N/A 07/13/2015   Procedure: Lower Extremity Angiography;  Surgeon: Lorretta Harp, MD;  Location: [facility];  Service: Cardiovascular;  Laterality: N/A;   PERIPHERAL VASCULAR CATHETERIZATION  07/13/2015   Procedure: Peripheral Vascular Atherectomy;  Surgeon: Lorretta Harp, MD;  Location: [facility];   Service: Cardiovascular;;   PERIPHERAL VASCULAR INTERVENTION  04/27/2018   Procedure: PERIPHERAL VASCULAR INTERVENTION;  Surgeon: Lorretta Harp, MD;  Location: [place];  Service: Cardiovascular;;  right popliteal artery   TONSILLECTOMY      Current Medications: Current Meds  Medication Sig   acetaminophen (TYLENOL) 500 MG tablet Take 1,000 mg by mouth every 6 (six) hours as needed for moderate pain or headache.   amLODipine (NORVASC) 5 MG tablet Take 1 tablet (5 mg total) by mouth daily.   apixaban (ELIQUIS) 5 MG TABS tablet Take 1 tablet (5 mg total) by mouth 2 (two) times daily.   aspirin EC 81 MG tablet Take 1 tablet (81 mg total) by mouth daily.   atorvastatin (LIPITOR) 80 MG tablet Take 1 tablet (80 mg total) by mouth daily.   carvedilol (COREG) 12.5 MG tablet Take 3 tablets (37.5 mg total) by mouth 2 (two) times daily.   clarithromycin (BIAXIN) 500 MG tablet Take 1 tablet (500 mg total) by mouth 2 (two) times daily.   Continuous Blood Gluc Sensor (FREESTYLE LIBRE 2 SENSOR) MISC CHECK BLOOD SUGAR FOUR TIMES DAILY   empagliflozin (JARDIANCE) 25 MG TABS tablet Take 1 tablet (25 mg total) by mouth daily before breakfast.   fenofibrate 160 MG tablet Take 1 tablet (160 mg total) by mouth daily.   fidaxomicin (DIFICID) 200 MG TABS tablet Take 1 tablet (200 mg total) by mouth 2 (two) times daily.   glipiZIDE (GLUCOTROL XL) 5 MG 24 hr tablet TAKE 1 TABLET BY MOUTH EVERY DAY WITH BREAKFAST   glucose blood (ONETOUCH ULTRA) test strip CHECK BLOOD SUGAR TWICE A DAY   Insulin Pen Needle (B-D UF III MINI PEN NEEDLES) 31G X 5 MM MISC USE DAILY WITH VICTOZA   liraglutide (VICTOZA) 18 MG/3ML SOPN INJECT 1.2 MG UNDER THE SKIN ONCE DAILY   lisinopril-hydrochlorothiazide (ZESTORETIC) 20-25 MG tablet Take 1 tablet by mouth daily.   loperamide (IMODIUM A-D) 2 MG tablet Take 4 mg by mouth 4 (four) times daily as needed for diarrhea or loose stools.   Omega-3 Fatty Acids (FISH OIL) 1000 MG CAPS  Take 1,000 mg by mouth 2 (two) times daily.   pantoprazole (PROTONIX) 40 MG tablet Take 1 tablet (40 mg total) by mouth 2 (two) times daily.   pioglitazone (ACTOS) 30 MG tablet TAKE 1 TABLET BY MOUTH EVERY DAY STOP TRADJENTA   saccharomyces boulardii (FLORASTOR) 250 MG capsule Take 1 capsule (250 mg total) by mouth 2 (two) times daily.   vedolizumab (ENTYVIO) 300 MG injection Inject 300 mg as directed See admin instructions. 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[employer] [place]  Tobacco Use   Smoking status: Former    Packs/day: 1.50    Years: 25.00    Pack years: 37.50    Types: Cigarettes    Quit date: 08/12/1996    Years since quitting: 24.7   Smokeless tobacco: Never  Vaping Use   Vaping Use: Never used  Substance and Sexual Activity   Alcohol use: Yes    Alcohol/week: 1.0 standard drink    Types: 1 Cans of beer per week    Comment: social use   Drug use: No   Sexual activity: Yes  Other Topics Concern   Not on file  Social History Narrative   ** Merged History Encounter **       Social Determinants of Health   Financial Resource Strain: Not on file  Food Insecurity: Not on file  Transportation Needs: Not on file  Physical Activity: Not on file  Stress: Not on file  Social Connections: Not on file     Family History: The patient's family history includes Colon cancer in his maternal grandmother; Colon polyps in his mother; Diabetes in his maternal aunt and mother; Heart disease in his father. There is no history of Esophageal cancer, Stomach cancer, or Rectal cancer.  ROS:   Please see the history of present illness.     EKGs/Labs/Other Studies Reviewed:    The following studies were  reviewed today:  Lower Extremity Dopplers/ABI 11/13/2020: Summary: - Right: No significant change compared to previous study. Heterogenous plaque throughout. The proximal CFA is now 50-74% stenosed. Patent right below-the-knee popliteal to proximal TPT without evidence of restenosis.  - Left: Minimal improvement is noted compared to previous study.  Heterogenous plaque throughout. Patent distal SFA with evidence of 50-74% restenosis, s/p atherectomy and PTA.   ABI Summary: - Resting right ankle-brachial index is within normal range. No evidence of significant right lower extremity arterial disease. The  right toe-brachial index is normal.  - Left: Resting left ankle-brachial index is within normal range.  No evidence of significant left lower extremity arterial disease. The left  toe-brachial index is normal. _______________  Left Cardiac Catheterization 02/08/2021: 1st Diag lesion is 99% stenosed.  Diagnostic Dominance: Right   _______________  [facility]wyn Reach Monitor 01/30/2021 to 02/13/2021: Patient had a min HR of 45 bpm, max HR of 187 bpm, and avg HR of 72 bpm. Predominant underlying rhythm was Sinus Rhythm. First Degree AV Block was present. 1 run of Ventricular Tachycardia occurred lasting 8 beats with a max rate of 187 bpm (avg 159  bpm). 7 Supraventricular Tachycardia runs occurred, the run with the fastest interval lasting 6 beats with a max rate of 111 bpm, the longest lasting 20 beats with an avg rate of 91 bpm. Atrial Fibrillation occurred (25% burden), ranging from 56-153 bpm  (avg of 98 bpm), the longest lasting 1 day 19 hours with an avg rate of 96 bpm. Atrial Fibrillation was detected within +/- 45 seconds of symptomatic patient event(s). Isolated SVEs were rare (<1.0%), SVE Couplets were rare (<1.0%), and SVE Triplets were  rare (<1.0%). Isolated VEs were rare (<1.0%), VE Couplets were rare (<1.0%), and no VE Triplets were present. Ventricular Trigeminy was present.   1: SR/SB/ST 2:  Short runs of SVT 3. Long runs of AF (up to 43 t\hours) assoc with Sx 4. Needs ROV to discuss _______________  Echocardiogram 03/13/2021: Impressions: 1. Left ventricular ejection fraction, by estimation, is 60 to 65%. The  left ventricle has normal function.  The left ventricle has no regional  wall motion abnormalities. There is mild left ventricular hypertrophy.  Left ventricular diastolic parameters  are consistent with Grade II diastolic dysfunction (pseudonormalization).   2. Right ventricular systolic function is normal. The right ventricular  size is normal.   3. Left atrial size was moderately dilated.   4. Right atrial size was moderately dilated.   5. The mitral valve is normal in structure. Trivial mitral valve  regurgitation. No evidence of mitral stenosis.   6. The aortic valve is tricuspid. There is moderate calcification of the  aortic valve. Aortic valve regurgitation is trivial. Mild aortic valve  stenosis.   7. Aortic dilatation noted. There is mild dilatation of the ascending  aorta, measuring 40 mm.   8. The inferior vena cava is normal in size with greater than 50%  respiratory variability, suggesting right atrial pressure of 3 mmHg.   EKG:  EKG ordered today. EKG personally reviewed and demonstrates sinus bradycardia, rate 53 bpm, with 1st degree AV block (PR interval 202 ms) but no acute ST/T changes. Normal axis. QTc 414 ms.  Recent Labs: 04/17/2021: ALT 24; BUN 26; Creatinine, Ser 1.98; Potassium 4.1; Sodium 139; TSH 1.42 04/24/2021: Hemoglobin 11.6; Platelets 278  Recent Lipid Panel    Component Value Date/Time   CHOL 133 03/02/2021 0807   CHOL 119 10/07/2016 0843   TRIG 266 (H) 03/02/2021 0807   HDL 30 (L) 03/02/2021 0807   HDL 26 (L) 10/07/2016 0843   CHOLHDL 4.4 03/02/2021 0807   VLDL 47 (H) 11/25/2016 0838   LDLCALC 68 03/02/2021 0807    Physical Exam:    Vital Signs: BP 102/60 (BP Location: Right Arm, Patient Position: Sitting, Cuff Size: Large)    Pulse (!) 53   Ht 5' 10"  (1.778 m)   Wt 233 lb (105.7 kg)   BMI 33.43 kg/m     Wt Readings from Last 3 Encounters:  05/17/21 233 lb (105.7 kg)  05/09/21 235 lb (106.6 kg)  04/24/21 230 lb (104.3 kg)     General: 65 y.o. Caucasian male in no acute distress. HEENT: Normocephalic and atraumatic. Sclera clear. . Neck: Supple. No carotid bruits. No JVD. Heart: Bradycardic with regular rhythm. Distinct S1 and S2. No murmurs, gallops, or rubs. Radial pulses 2+ and equal bilaterally. Lungs: No increased work of breathing. Clear to ausculation bilaterally. No wheezes, rhonchi, or rales.  Abdomen: Soft, non-distended, and non-tender to palpation.  MSK: Normal strength and tone for age.  Extremities: No lower extremity edema.    Skin: Warm and dry. Neuro: Alert and oriented x3. No focal deficits. Psych: Normal affect. Responds appropriately.  Assessment:    1. Pre-op evaluation   2. Hepatocellular carcinoma ([place])   3. Coronary artery disease involving native coronary artery of native heart without angina pectoris   4. Paroxysmal atrial fibrillation (HCC)   5. PAD (peripheral artery disease) ([place])   6. Primary hypertension   7. Hyperlipidemia, unspecified hyperlipidemia type   8. Type 2 diabetes mellitus with complication, with long-term current use of insulin (HCC)   9. Stage 3b chronic kidney disease ([place])     Plan:    Pre-Op Evaluation - Patient recently diagnosed with hepatocellular carcinoma and presents for pre-op evaluation for right hepatectomy. - Stable from a cardiac standpoint. No angina, acute CHF, palpitations, syncope.  - Able to complete 4.0 METS  any angina. Per Revised Cardiac Risk Index, considered high risk with >11% chance for adverse cardiac event given high risk  nature of procedure, CAD, diabetes on insulin, and borderline creatinine around 2.0. Discussed this with patient and he understands that he is high risk. He states that the plan is no longer for surgery  right now. They are going to try radiation first. If he does end up needing surgery, would recommend the surgeons office refax [place] a clearance form at that time. He will likely need a follow-up call just to ensure he has had no new cardiac symptoms. As long as he is stable, he would be OK to proceed with surgery with the understanding that he is high risk. Per Pharmacy and office protocol, Eliquis can be held for 2 days prior to procedure. He will not need bridging with Lovenox (enoxaparin) around procedure. He should restart Eliquis on the day after, at discretion of procedure MD. Will route note to requesting surgeon's office via the Epic fax function.  CAD - Recent cardiac catheterization showed single vessel CAD with high grade stenosis of 1st Diag. No other CAD noted. Atherectomy/PCI was attempted but procedure was aborted due to perforation. - No angina.  - Continue Aspirin 43m daily, Coreg 37,562mtwice daily, Amlodipine 90m42maily, and Lipitor 41m73mily.  Paroxysmal Atrial Fibrillation - Maintaining sinus rhythm. Rate in the 50s. Patient asymptomatic with this. - Continue Coreg 37.90mg 29mce daily. - Continue chronic anticoagulation with Eliquis 90mg t32me daily.  - Advised patient to monitor heart rate at home and notify us if Koreansistently <50 bpm or he becomes symptomatic.  - He has been seen by Dr. AllredRayann Hemanhere was discussion about starting Amiodarone vs ablation depending on work-up of liver mass. Follow-up with EP as directed.  PAD - S/p multiple interventions to left SFA and right popliteal artery. Most recent dopplers in 11/2020 showed normal ABIs with patent right below the knee popliteal artery with no evidence of restenosis and patent distal SFA with evidence of 50-74% restenosis.  - No claudication.  - On aspirin and high-intensity statin.  Hypertension - BP soft. 102/60 in the right arm and 98/54 in the left arm. Patient asymptomatic with this. He states BP is normally higher  at home but has been softer lately. - Current medications: Amlodipine 90mg da54m, Coreg 37.90mg twi31mdaily, Lisinopril-HCTZ 20-290mg dai23mDiscussed stopping the HCTZ portion of combo medications. However, patient preferred to not make any changes. Given he is asymptomatic this is OK.  - Advised patient to keep close eye of BP at home and notify us if sysKorealic BP <95 or if<37 becomes symptomatic.  Hyperlipidemia - LDL 68 in 02/2021.  - Continue Lipitor 41mg dail63md Fenofibrate.  Type 2 Diabetes Mellitus - Management per PCP.  CKD Stage III - Creatinine has ranged from 1.6 to 2.0 over the last couple of months.  - Stable at 1.98 on last check on 04/17/2021.   Disposition: Follow up in 3 month.    Medication Adjustments/Labs and Tests Ordered: Current medicines are reviewed at length with the patient today.  Concerns regarding medicines are outlined above.  Orders Placed This Encounter  Procedures   EKG 12-Lead    No orders of the defined types were placed in this encounter.   Patient Instructions  Medication Instructions:  Your physician recommends that you continue on your current medications as directed. Please refer to the Current Medication list given to you today.  *If you need a refill on your cardiac medications before your next appointment, please call your pharmacy*  Lab Work: NONE ordered at this time  of appointment   If you have labs (blood work) drawn today and your tests are completely normal, you will receive your results only by: [place] (if you have MyChart) OR A paper copy in the mail If you have any lab test that is abnormal or we need to change your treatment, we will call you to review the results.  Testing/Procedures: NONE ordered at this time of appointment   Follow-Up: At [facility], you and your health needs are our priority.  As part of our continuing mission to provide you with exceptional heart care, we have created designated Provider  Care Teams.  These Care Teams include your primary Cardiologist (physician) and Advanced Practice Providers (APPs -  Physician Assistants and Nurse Practitioners) who all work together to provide you with the care you need, when you need it.  Your next appointment:   3 month(s)  The format for your next appointment:   In Person  Provider:   Quay Burow, MD  Other Instructions Monitor blood pressure (BP) and heart rate (HR) at home 3 times a week if both BP and HR continue to be low-if HR is consistently in the 99'Y and Systolic (top number) BP is below 95 and/or if you are feeling systematic please give our office a call.     Signed, Darreld Mclean, [name]  05/17/2021 11:46 AM    [facility]

## 2021-05-14 ENCOUNTER — Other Ambulatory Visit (HOSPITAL_COMMUNITY): Payer: 59

## 2021-05-16 ENCOUNTER — Encounter: Payer: Self-pay | Admitting: Student

## 2021-05-16 DIAGNOSIS — C220 Liver cell carcinoma: Secondary | ICD-10-CM | POA: Insufficient documentation

## 2021-05-17 ENCOUNTER — Other Ambulatory Visit: Payer: Self-pay

## 2021-05-17 ENCOUNTER — Encounter: Payer: Self-pay | Admitting: Student

## 2021-05-17 ENCOUNTER — Ambulatory Visit: Payer: HMO | Admitting: Student

## 2021-05-17 DIAGNOSIS — I10 Essential (primary) hypertension: Secondary | ICD-10-CM

## 2021-05-17 DIAGNOSIS — C220 Liver cell carcinoma: Secondary | ICD-10-CM | POA: Diagnosis not present

## 2021-05-17 DIAGNOSIS — I739 Peripheral vascular disease, unspecified: Secondary | ICD-10-CM | POA: Diagnosis not present

## 2021-05-17 DIAGNOSIS — Z794 Long term (current) use of insulin: Secondary | ICD-10-CM

## 2021-05-17 DIAGNOSIS — Z01818 Encounter for other preprocedural examination: Secondary | ICD-10-CM

## 2021-05-17 DIAGNOSIS — E785 Hyperlipidemia, unspecified: Secondary | ICD-10-CM | POA: Diagnosis not present

## 2021-05-17 DIAGNOSIS — N1832 Chronic kidney disease, stage 3b: Secondary | ICD-10-CM

## 2021-05-17 DIAGNOSIS — I480 Paroxysmal atrial fibrillation: Secondary | ICD-10-CM

## 2021-05-17 DIAGNOSIS — I2510 Atherosclerotic heart disease of native coronary artery without angina pectoris: Secondary | ICD-10-CM

## 2021-05-17 DIAGNOSIS — E118 Type 2 diabetes mellitus with unspecified complications: Secondary | ICD-10-CM

## 2021-05-17 NOTE — Patient Instructions (Signed)
Medication Instructions:  Your physician recommends that you continue on your current medications as directed. Please refer to the Current Medication list given to you today.  *If you need a refill on your cardiac medications before your next appointment, please call your pharmacy*  Lab Work: NONE ordered at this time of appointment   If you have labs (blood work) drawn today and your tests are completely normal, you will receive your results only by: [place] (if you have MyChart) OR A paper copy in the mail If you have any lab test that is abnormal or we need to change your treatment, we will call you to review the results.  Testing/Procedures: NONE ordered at this time of appointment   Follow-Up: At [facility], you and your health needs are our priority.  As part of our continuing mission to provide you with exceptional heart care, we have created designated Provider Care Teams.  These Care Teams include your primary Cardiologist (physician) and Advanced Practice Providers (APPs -  Physician Assistants and Nurse Practitioners) who all work together to provide you with the care you need, when you need it.  Your next appointment:   3 month(s)  The format for your next appointment:   In Person  Provider:   Quay Burow, MD  Other Instructions Monitor blood pressure (BP) and heart rate (HR) at home 3 times a week if both BP and HR continue to be low-if HR is consistently in the 81'W and Systolic (top number) BP is below 95 and/or if you are feeling systematic please give our office a call.

## 2021-05-18 ENCOUNTER — Telehealth: Payer: Self-pay | Admitting: *Deleted

## 2021-05-21 ENCOUNTER — Inpatient Hospital Stay: Payer: HMO | Attending: Oncology | Admitting: Oncology

## 2021-05-21 ENCOUNTER — Other Ambulatory Visit: Payer: Self-pay

## 2021-05-21 ENCOUNTER — Other Ambulatory Visit (HOSPITAL_COMMUNITY): Payer: Self-pay | Admitting: Interventional Radiology

## 2021-05-21 VITALS — BP 114/67 | HR 63 | Temp 98.7°F | Resp 18 | Ht 70.0 in | Wt 231.0 lb

## 2021-05-21 DIAGNOSIS — C220 Liver cell carcinoma: Secondary | ICD-10-CM

## 2021-05-21 DIAGNOSIS — I129 Hypertensive chronic kidney disease with stage 1 through stage 4 chronic kidney disease, or unspecified chronic kidney disease: Secondary | ICD-10-CM | POA: Diagnosis not present

## 2021-05-21 DIAGNOSIS — K5190 Ulcerative colitis, unspecified, without complications: Secondary | ICD-10-CM | POA: Diagnosis not present

## 2021-05-21 DIAGNOSIS — I739 Peripheral vascular disease, unspecified: Secondary | ICD-10-CM

## 2021-05-21 DIAGNOSIS — N1832 Chronic kidney disease, stage 3b: Secondary | ICD-10-CM

## 2021-05-21 DIAGNOSIS — E1122 Type 2 diabetes mellitus with diabetic chronic kidney disease: Secondary | ICD-10-CM | POA: Diagnosis not present

## 2021-05-21 DIAGNOSIS — I2510 Atherosclerotic heart disease of native coronary artery without angina pectoris: Secondary | ICD-10-CM | POA: Diagnosis not present

## 2021-05-21 DIAGNOSIS — N189 Chronic kidney disease, unspecified: Secondary | ICD-10-CM

## 2021-05-21 DIAGNOSIS — I10 Essential (primary) hypertension: Secondary | ICD-10-CM

## 2021-05-21 NOTE — Progress Notes (Signed)
[facility] New Patient Consult   Requesting MD: Susy Frizzle, [place] [address] [place],  [place] [address]   Matthew Chen 65 y.o.  1956/06/09    Reason for Consult: Hepatocellular carcinoma   HPI: Matthew Chen is followed by Dr. Alvester Chou for a thoracic aortic aneurysm.  A CT of the chest on 01/30/2021 revealed diffuse hepatic steatosis.  An enhancing 7.1 cm masslike area was noted in segment 5/6.  There was evidence of early cirrhosis.  Gastropathic and periportal nodes measured up to 7 mm. An MRI of the abdomen on 03/22/2021 Revealed a hyperenhancing subcapsular mass in segment 6 measuring 7 x 5.1 cm.  No pathologically enlarged lymph nodes.  The lesion was felt to be a LI-RADS 5 tumor.  He was referred for an ultrasound-guided biopsy of the liver mass on 04/24/2021.  The pathology revealed hepatocellular carcinoma.  Matthew Chen was referred to Dr. Barry Dienes.  She feels he may be a surgical candidate in the future, but she referred him to interventional radiology to consider embolization therapy.  He is scheduled to see interventional radiology in a few weeks.  Matthew Chen feels well.   Past Medical History:  Diagnosis Date   Adenomatous colon polyp 03/2008   Anginal pain ([place])    Aortic aneurysm, thoracic 09/09/2016   4.4 cm by echo 05/2015   Arthritis    "joints in my fingers ache" (07/13/2015)   Bicuspid aortic valve 09/09/2016   Cataract 2019   bilateral eyes   CKD (chronic kidney disease), stage III ([place])    Coronary artery disease    Diabetes mellitus without complication ([place])    Hemorrhoids    Hepatocellular carcinoma ([facility]) 04/24/2021   Hyperlipidemia    Hypertension    Paroxysmal atrial fibrillation ([facility])    Peripheral arterial disease ([place])    a. dopppler 05/29/2015 revealed high-grade right popliteal and distal left SFA disease b. LE angio 06/28/2015 revealing high grade calcific dx with distal L SFA and R popliteal artery s/p diamondback orbital  rotational atherectomy, PTA of L SFA  c. 07/13/2015 diamondback orbital rotational atherectomy and drug eluting balloon angioplasty of R popliteal artery (P2 segment) using distal protection    Type II diabetes mellitus ([facility])    Ulcerative colitis     Past Surgical History:  Procedure Laterality Date   ABDOMINAL AORTOGRAM W/LOWER EXTREMITY N/A 04/27/2018   Procedure: ABDOMINAL AORTOGRAM W/LOWER EXTREMITY;  Surgeon: Lorretta Harp, MD;  Location: [facility];  Service: Cardiovascular;  Laterality: N/A;   COLONOSCOPY W/ POLYPECTOMY  X 4-5   CORONARY ATHERECTOMY N/A 02/15/2021   Procedure: CORONARY ATHERECTOMY;  Surgeon: Lorretta Harp, MD;  Location: [facility];  Service: Cardiovascular;  Laterality: N/A;  aborted    CORONARY BALLOON ANGIOPLASTY N/A 02/15/2021   Procedure: CORONARY BALLOON ANGIOPLASTY;  Surgeon: Lorretta Harp, MD;  Location: [facility];  Service: Cardiovascular;  Laterality: N/A;   COSMETIC SURGERY  < 2000   "removed birthmark from top of my head"   IR RADIOLOGIST EVAL & MGMT  05/08/2021   LEFT HEART CATH AND CORONARY ANGIOGRAPHY N/A 02/08/2021   Procedure: LEFT HEART CATH AND CORONARY ANGIOGRAPHY;  Surgeon: Lorretta Harp, MD;  Location: [facility];  Service: Cardiovascular;  Laterality: N/A;   PERIPHERAL VASCULAR ATHERECTOMY  04/27/2018   Procedure: PERIPHERAL VASCULAR ATHERECTOMY;  Surgeon: Lorretta Harp, MD;  Location: [facility];  Service: Cardiovascular;;  right  popliteal artery   PERIPHERAL VASCULAR CATHETERIZATION N/A 06/29/2015   Procedure: Lower Extremity Angiography;  Surgeon: Lorretta Harp, MD;  Location: [facility];  Service: Cardiovascular;  Laterality: N/A;   PERIPHERAL VASCULAR CATHETERIZATION N/A 07/13/2015   Procedure: Lower Extremity Angiography;  Surgeon: Lorretta Harp, MD;  Location: [facility];  Service: Cardiovascular;  Laterality: N/A;   PERIPHERAL VASCULAR CATHETERIZATION  07/13/2015    Procedure: Peripheral Vascular Atherectomy;  Surgeon: Lorretta Harp, MD;  Location: [facility];  Service: Cardiovascular;;   PERIPHERAL VASCULAR INTERVENTION  04/27/2018   Procedure: PERIPHERAL VASCULAR INTERVENTION;  Surgeon: Lorretta Harp, MD;  Location: [facility];  Service: Cardiovascular;;  right popliteal artery   TONSILLECTOMY      Medications: Reviewed  Allergies:  Allergies  Allergen Reactions   Penicillins Other (See Comments)    Unsure, childhood reaction Has patient had a PCN reaction causing immediate rash, facial/tongue/throat swelling, SOB or lightheadedness with hypotension: Unknown Has patient had a PCN reaction causing severe rash involving mucus membranes or skin necrosis: Unknown Has patient had a PCN reaction that required hospitalization: No Has patient had a PCN reaction occurring within the last 10 years: No If all of the above answers are "NO", then may proceed with Cephalosporin use.     Family history: His maternal grandmother had colon cancer  Social History:   He lives with his wife in [place].  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  He quit smoking cigarettes 23 years ago.  Rare alcohol use.  No transfusion history.  No risk factor for HIV or hepatitis.  He has received COVID-19 vaccines.  ROS:   Positives include: Bleeds easily with trauma, diarrhea  A complete ROS was otherwise negative.  Physical Exam:  Blood pressure 114/67, pulse 63, temperature 98.7 F (37.1 C), temperature source Oral, resp. rate 18, height 5' 10"  (1.778 m), weight 231 lb (104.8 kg), SpO2 98 %.  HEENT: Neck without mass Lungs: Clear bilaterally Cardiac: Regular rate and rhythm Abdomen: No mass, nontender, no hepatosplenomegaly, no apparent ascites  Vascular: No leg edema Lymph nodes: No cervical, supraclavicular, axillary, or inguinal nodes Neurologic: Alert and oriented, the motor exam appears intact in the  upper and lower extremities bilaterally Skin: No telangiectasias Musculoskeletal: No spine tenderness   LAB:  CBC  Lab Results  Component Value Date   WBC 5.2 04/24/2021   HGB 11.6 (L) 04/24/2021   HCT 36.7 (L) 04/24/2021   MCV 91.3 04/24/2021   PLT 278 04/24/2021   NEUTROABS 2.9 04/17/2021        CMP  Lab Results  Component Value Date   NA 139 04/17/2021   K 4.1 04/17/2021   CL 107 04/17/2021   CO2 23 04/17/2021   GLUCOSE 118 (H) 04/17/2021   BUN 26 (H) 04/17/2021   CREATININE 1.98 (H) 04/17/2021   CALCIUM 9.2 04/17/2021   PROT 7.2 04/17/2021   ALBUMIN 4.0 04/17/2021   AST 22 04/17/2021   ALT 24 04/17/2021   ALKPHOS 57 04/17/2021   BILITOT 0.4 04/17/2021   GFRNONAA 42 (L) 02/16/2021   GFRAA 49 (L) 11/20/2020    Imaging: As per HPI, CT and MRI images reviewed with Matthew Chen and his wife    Assessment/Plan:   Hepatocellular carcinoma CT angio chest aorta 01/30/2021-no evidence of thoracic aortic aneurysm, diffuse hepatic steatosis, suggestion of early cirrhosis, 7.1 cm enhancing masslike area in segment 5/6, prominent gastrohepatic  and periportal nodes measuring up to 7 mm MRI liver 03/22/2021-arterial hyperenhancing subcapsular mass, segment 6, 7 x 5.1 cm, LI-RADS 5, no pathologically large lymph nodes Ultrasound-guided biopsy 04/24/2021-hepatocellular carcinoma Ulcerative colitis-maintained on vedolizumab Peripheral arterial disease History of a thoracic aortic aneurysm CAD Diabetes Hypertension Chronic renal failure   Disposition:   Matthew Chen has been diagnosed with hepatocellular carcinoma.  He has an isolated right liver mass.  He does not have known underlying cirrhosis, though there were CT changes suggestive of early cirrhosis.  He saw Dr. Barry Dienes.  He is not a transplant candidate.  She referred him to interventional radiology to consider radioembolization.  He saw Dr. Serafina Chen and is being scheduled for a radioembolization segmentectomy.  He may be  a candidate for hepatic surgery or ablation in the future.  Matthew Chen understands we will consider systemic treatment options if he develops progressive disease in the liver or elsewhere.  He will undergo the radioembolization procedure and have follow-up imaging as directed by interventional radiology.  He will return for an office visit in 8 months.  We will request an AFP level when he is seen by interventional radiology  Betsy Coder, MD  05/21/2021, 4:38 PM

## 2021-05-22 ENCOUNTER — Other Ambulatory Visit: Payer: Self-pay | Admitting: *Deleted

## 2021-05-22 ENCOUNTER — Telehealth: Payer: Self-pay | Admitting: Gastroenterology

## 2021-05-22 DIAGNOSIS — R197 Diarrhea, unspecified: Secondary | ICD-10-CM

## 2021-05-22 DIAGNOSIS — C220 Liver cell carcinoma: Secondary | ICD-10-CM

## 2021-05-22 MED ORDER — SACCHAROMYCES BOULARDII 250 MG PO CAPS: 250.0000 mg | ORAL_CAPSULE | Freq: Two times a day (BID) | ORAL | 0 refills | Status: AC

## 2021-05-22 NOTE — Progress Notes (Signed)
AFP level orders entered and patient has appt on 10/12 at 0915 here at [facility]

## 2021-05-22 NOTE — Telephone Encounter (Signed)
Continue Florastor for 1 month Repeat GI stool profile

## 2021-05-22 NOTE — Telephone Encounter (Signed)
Patient report 5-6 watery BM for the last 4 days.  He completed Dificid, biaxin, and has a few days left of Florastor for Enteropathogenic E coli and c-diff treated in September.  Stools had returned to normal oafter a week on antibiotics, but started becoming more frequent and watery this weekend.  Please advise

## 2021-05-22 NOTE — Telephone Encounter (Signed)
Pt called this morning stating he believes the infections he had about a month ago have returned.  He said Dr. Fuller Plan said to call back if he was experiencing the symptoms and he'd call something in to the pharmacy for him.  Any questions, please call patient.  Thank you.

## 2021-05-22 NOTE — Telephone Encounter (Signed)
Patient aware and new orders sent.  He will come this pm to do stool study

## 2021-05-23 ENCOUNTER — Other Ambulatory Visit: Payer: Self-pay

## 2021-05-23 ENCOUNTER — Other Ambulatory Visit: Payer: HMO

## 2021-05-23 ENCOUNTER — Inpatient Hospital Stay: Payer: HMO

## 2021-05-23 DIAGNOSIS — C220 Liver cell carcinoma: Secondary | ICD-10-CM | POA: Diagnosis not present

## 2021-05-23 DIAGNOSIS — R197 Diarrhea, unspecified: Secondary | ICD-10-CM | POA: Diagnosis not present

## 2021-05-24 LAB — AFP TUMOR MARKER: AFP, Serum, Tumor Marker: 2.2 ng/mL (ref 0.0–8.4)

## 2021-05-25 LAB — GASTROINTESTINAL PATHOGEN PANEL PCR
C. difficile Tox A/B, PCR: NOT DETECTED
Campylobacter, PCR: NOT DETECTED
Cryptosporidium, PCR: NOT DETECTED
E coli (ETEC) LT/ST PCR: NOT DETECTED
E coli (STEC) stx1/stx2, PCR: NOT DETECTED
E coli 0157, PCR: NOT DETECTED
Giardia lamblia, PCR: NOT DETECTED
Norovirus, PCR: NOT DETECTED
Rotavirus A, PCR: NOT DETECTED
Salmonella, PCR: NOT DETECTED
Shigella, PCR: NOT DETECTED

## 2021-05-28 ENCOUNTER — Telehealth: Payer: Self-pay

## 2021-05-28 ENCOUNTER — Other Ambulatory Visit (HOSPITAL_COMMUNITY): Payer: HMO

## 2021-05-28 NOTE — Telephone Encounter (Signed)
   Matthew Chen 03-Jun-1956 [id number]  Dear Dr. Dennard Schaumann:  We have scheduled the above named patient for a(n) Colonoscopy procedure. Our records show that (s)he is on anticoagulation therapy.  Please advise as to whether the patient may come off their therapy of Eliquis 2 days prior to their procedure which is scheduled for 06/26/21.  Please route your response to [employer], CMA.  Sincerely,    [facility]

## 2021-05-28 NOTE — Telephone Encounter (Signed)
Estill Bamberg,  Please obtain an Eliquis hold for this patient as he has been scheduled for a PV on 06/05/2021 with a colon scheduled on 06/26/2021. Thank you

## 2021-05-28 NOTE — Telephone Encounter (Signed)
Noted on PV chart for Eliquis hold

## 2021-05-28 NOTE — Telephone Encounter (Signed)
Clearance letter sent to PCP. See other telephone call.

## 2021-06-04 ENCOUNTER — Other Ambulatory Visit: Payer: Self-pay

## 2021-06-04 ENCOUNTER — Ambulatory Visit (HOSPITAL_COMMUNITY)
Admission: RE | Admit: 2021-06-04 | Discharge: 2021-06-04 | Disposition: A | Discharge status: Home / Self Care | Payer: HMO | Source: Ambulatory Visit | Attending: Physician Assistant | Admitting: Physician Assistant

## 2021-06-04 VITALS — BP 138/68 | HR 54 | Ht 70.0 in | Wt 232.6 lb

## 2021-06-04 DIAGNOSIS — Z7984 Long term (current) use of oral hypoglycemic drugs: Secondary | ICD-10-CM | POA: Insufficient documentation

## 2021-06-04 DIAGNOSIS — Z6833 Body mass index (BMI) 33.0-33.9, adult: Secondary | ICD-10-CM | POA: Diagnosis not present

## 2021-06-04 DIAGNOSIS — Z7982 Long term (current) use of aspirin: Secondary | ICD-10-CM | POA: Diagnosis not present

## 2021-06-04 DIAGNOSIS — E669 Obesity, unspecified: Secondary | ICD-10-CM | POA: Insufficient documentation

## 2021-06-04 DIAGNOSIS — I2510 Atherosclerotic heart disease of native coronary artery without angina pectoris: Secondary | ICD-10-CM | POA: Insufficient documentation

## 2021-06-04 DIAGNOSIS — I480 Paroxysmal atrial fibrillation: Secondary | ICD-10-CM | POA: Insufficient documentation

## 2021-06-04 DIAGNOSIS — Z880 Allergy status to penicillin: Secondary | ICD-10-CM | POA: Diagnosis not present

## 2021-06-04 DIAGNOSIS — Z87891 Personal history of nicotine dependence: Secondary | ICD-10-CM | POA: Insufficient documentation

## 2021-06-04 DIAGNOSIS — D6869 Other thrombophilia: Secondary | ICD-10-CM | POA: Diagnosis not present

## 2021-06-04 DIAGNOSIS — Z79899 Other long term (current) drug therapy: Secondary | ICD-10-CM | POA: Diagnosis not present

## 2021-06-04 DIAGNOSIS — Z7901 Long term (current) use of anticoagulants: Secondary | ICD-10-CM | POA: Diagnosis not present

## 2021-06-04 DIAGNOSIS — I10 Essential (primary) hypertension: Secondary | ICD-10-CM | POA: Insufficient documentation

## 2021-06-04 DIAGNOSIS — Z794 Long term (current) use of insulin: Secondary | ICD-10-CM | POA: Diagnosis not present

## 2021-06-04 DIAGNOSIS — I4819 Other persistent atrial fibrillation: Secondary | ICD-10-CM | POA: Insufficient documentation

## 2021-06-04 NOTE — Progress Notes (Signed)
Primary Care Physician: Susy Frizzle, MD Primary Cardiologist: Dr Gwenlyn Found Primary Electrophysiologist: Dr Rayann Heman Referring Physician: Dr Nada Boozer is a 65 y.o. male with a history of HTN, DM, CAD, CKD, PVD, hepatocellular carcinoma, atrial fibrillation who presents for consultation in the [facility].  He reports being told when having "a test" in July 2022 that he had afib.  He was unaware.  He had an event monitor placed which revealed afib with burden of 25%. His coreg was increased to 37.6m BID. He denies any awareness of his afib. Patient is on Eliquis for a CHADS2VASC score of 4. He has been diagnosed with hepatocellular carcinoma and will start radiation therapy soon.   Today, he denies symptoms of palpitations, chest pain, shortness of breath, orthopnea, PND, lower extremity edema, dizziness, presyncope, syncope, snoring, daytime somnolence, bleeding, or neurologic sequela. The patient is tolerating medications without difficulties and is otherwise without complaint today.    Atrial Fibrillation Risk Factors:  he does not have symptoms or diagnosis of sleep apnea. he does not have a history of rheumatic fever.   he has a BMI of Body mass index is 33.37 kg/m..Marland KitchenFiled Weights   06/04/21 0904  Weight: 105.5 kg    Family History  Problem Relation Age of Onset   Colon cancer Maternal Grandmother        in her 678's  Colon polyps Mother        in her 5100's  Diabetes Mother    Heart disease Father    Diabetes Maternal Aunt        Aunts and Uncles   Esophageal cancer Neg Hx    Stomach cancer Neg Hx    Rectal cancer Neg Hx      Atrial Fibrillation Management history:  Previous antiarrhythmic drugs: none Previous cardioversions: none Previous ablations: none CHADS2VASC score: 4 Anticoagulation history: Eliquis   Past Medical History:  Diagnosis Date   Adenomatous colon polyp 03/2008   Anginal pain ([facility]    Aortic  aneurysm, thoracic 09/09/2016   4.4 cm by echo 05/2015   Arthritis    "joints in my fingers ache" (07/13/2015)   Bicuspid aortic valve 09/09/2016   Cataract 2019   bilateral eyes   CKD (chronic kidney disease), stage III ([facility]    Coronary artery disease    Diabetes mellitus without complication ([facility]    Hemorrhoids    Hepatocellular carcinoma ([facility])    Hyperlipidemia    Hypertension    Paroxysmal atrial fibrillation ([facility]    Peripheral arterial disease ([facility]    a. dopppler 05/29/2015 revealed high-grade right popliteal and distal left SFA disease b. LE angio 06/28/2015 revealing high grade calcific dx with distal L SFA and R popliteal artery s/p diamondback orbital rotational atherectomy, PTA of L SFA  c. 07/13/2015 diamondback orbital rotational atherectomy and drug eluting balloon angioplasty of R popliteal artery (P2 segment) using distal protection    Type II diabetes mellitus ([place]    Ulcerative colitis    Past Surgical History:  Procedure Laterality Date   ABDOMINAL AORTOGRAM W/LOWER EXTREMITY N/A 04/27/2018   Procedure: ABDOMINAL AORTOGRAM W/LOWER EXTREMITY;  Surgeon: BLorretta Harp MD;  [facility];  Service: Cardiovascular;  Laterality: N/A;   COLONOSCOPY W/ POLYPECTOMY  X 4-5   CORONARY ATHERECTOMY N/A 02/15/2021   Procedure: CORONARY ATHERECTOMY;  Surgeon: BLorretta Harp MD;  [facility];  Service: Cardiovascular;  Laterality:  N/A;  aborted    CORONARY BALLOON ANGIOPLASTY N/A 02/15/2021   Procedure: CORONARY BALLOON ANGIOPLASTY;  Surgeon: Lorretta Harp, MD;  Location: [facility];  Service: Cardiovascular;  Laterality: N/A;   COSMETIC SURGERY  < 2000   "removed birthmark from top of my head"   IR RADIOLOGIST EVAL & MGMT  05/08/2021   LEFT HEART CATH AND CORONARY ANGIOGRAPHY N/A 02/08/2021   Procedure: LEFT HEART CATH AND CORONARY ANGIOGRAPHY;  Surgeon: Lorretta Harp, MD;  Location: [facility];  Service: Cardiovascular;   Laterality: N/A;   PERIPHERAL VASCULAR ATHERECTOMY  04/27/2018   Procedure: PERIPHERAL VASCULAR ATHERECTOMY;  Surgeon: Lorretta Harp, MD;  Location: [facility];  Service: Cardiovascular;;  right popliteal artery   PERIPHERAL VASCULAR CATHETERIZATION N/A 06/29/2015   Procedure: Lower Extremity Angiography;  Surgeon: Lorretta Harp, MD;  Location: [facility];  Service: Cardiovascular;  Laterality: N/A;   PERIPHERAL VASCULAR CATHETERIZATION N/A 07/13/2015   Procedure: Lower Extremity Angiography;  Surgeon: Lorretta Harp, MD;  Location: [facility];  Service: Cardiovascular;  Laterality: N/A;   PERIPHERAL VASCULAR CATHETERIZATION  07/13/2015   Procedure: Peripheral Vascular Atherectomy;  Surgeon: Lorretta Harp, MD;  Location: [facility];  Service: Cardiovascular;;   PERIPHERAL VASCULAR INTERVENTION  04/27/2018   Procedure: PERIPHERAL VASCULAR INTERVENTION;  Surgeon: Lorretta Harp, MD;  Location: [facility];  Service: Cardiovascular;;  right popliteal artery   TONSILLECTOMY      Current Outpatient Medications  Medication Sig Dispense Refill   acetaminophen (TYLENOL) 500 MG tablet Take 1,000 mg by mouth every 6 (six) hours as needed for moderate pain or headache.     amLODipine (NORVASC) 5 MG tablet Take 1 tablet (5 mg total) by mouth daily. 60 tablet 2   apixaban (ELIQUIS) 5 MG TABS tablet Take 1 tablet (5 mg total) by mouth 2 (two) times daily. 90 tablet 1   aspirin EC 81 MG tablet Take 1 tablet (81 mg total) by mouth daily. 90 tablet 3   atorvastatin (LIPITOR) 80 MG tablet Take 1 tablet (80 mg total) by mouth daily. 60 tablet 2   canagliflozin (INVOKANA) 300 MG TABS tablet Take 300 mg by mouth daily before breakfast.     carvedilol (COREG) 12.5 MG tablet Take 3 tablets (37.5 mg total) by mouth 2 (two) times daily. 120 tablet 2   Continuous Blood Gluc Sensor (FREESTYLE LIBRE 2 SENSOR) MISC CHECK BLOOD SUGAR FOUR TIMES DAILY 1 each 1    empagliflozin (JARDIANCE) 25 MG TABS tablet Take 1 tablet (25 mg total) by mouth daily before breakfast. 90 tablet 3   fenofibrate 160 MG tablet Take 1 tablet (160 mg total) by mouth daily. 90 tablet 1   glipiZIDE (GLUCOTROL XL) 5 MG 24 hr tablet TAKE 1 TABLET BY MOUTH EVERY DAY WITH BREAKFAST 90 tablet 0   glucose blood (ONETOUCH ULTRA) test strip CHECK BLOOD SUGAR TWICE A DAY 50 strip 15   Insulin Pen Needle (B-D UF III MINI PEN NEEDLES) 31G X 5 MM MISC USE DAILY WITH VICTOZA 100 each 0   liraglutide (VICTOZA) 18 MG/3ML SOPN INJECT 1.2 MG UNDER THE SKIN ONCE DAILY 6 mL 9   lisinopril-hydrochlorothiazide (ZESTORETIC) 20-25 MG tablet Take 1 tablet by mouth daily. 90 tablet 2   loperamide (IMODIUM A-D) 2 MG tablet Take 4 mg by mouth 4 (four) times daily as needed for diarrhea or loose stools.     Omega-3 Fatty  Acids (FISH OIL) 1000 MG CAPS Take 1,000 mg by mouth 2 (two) times daily.     pantoprazole (PROTONIX) 40 MG tablet Take 1 tablet (40 mg total) by mouth 2 (two) times daily. 60 tablet 11   pioglitazone (ACTOS) 30 MG tablet TAKE 1 TABLET BY MOUTH EVERY DAY STOP TRADJENTA 90 tablet 2   saccharomyces boulardii (FLORASTOR) 250 MG capsule Take 1 capsule (250 mg total) by mouth 2 (two) times daily. 60 capsule 0   vedolizumab (ENTYVIO) 300 MG injection Inject 300 mg as directed See admin instructions. Every 2 months     No current facility-administered medications for this encounter.    Allergies  Allergen Reactions   Penicillins Other (See Comments)    Unsure, childhood reaction Has patient had a PCN reaction causing immediate rash, facial/tongue/throat swelling, SOB or lightheadedness with hypotension: Unknown Has patient had a PCN reaction causing severe rash involving mucus membranes or skin necrosis: Unknown Has patient had a PCN reaction that required hospitalization: No Has patient had a PCN reaction occurring within the last 10 years: No If all of the above answers are "NO", then may  proceed with Cephalosporin use.     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[employer], academic  Tobacco Use   Smoking status: Former    Packs/day: 1.50    Years: 25.00    Pack years: 37.50    Types: Cigarettes    Quit date: 08/12/1996    Years since quitting: 24.8   Smokeless tobacco: Never  Vaping Use   Vaping Use: Never used  Substance and Sexual Activity   Alcohol use: Yes    Alcohol/week: 1.0 standard drink    Types: 1 Cans of beer per week    Comment: social use   Drug use: No   Sexual activity: Yes  Other Topics Concern   Not on file  Social History Narrative   ** Merged History Encounter **       Social Determinants of Health   Financial Resource Strain: Not on file  Food Insecurity: Not on file  Transportation Needs: Not on file  Physical Activity: Not on file  Stress: Not on file  Social Connections: Not on file  Intimate Partner Violence: Not on file     ROS- All systems are reviewed and negative except as per the HPI above.  Physical Exam: Vitals:   06/04/21 0904  BP: 138/68  Pulse: (!) 54  Weight: 105.5 kg  Height: 5' 10"  (1.778 m)    GEN- The patient is a well appearing obese male, alert and oriented x 3 today.   Head- normocephalic, atraumatic Eyes-  Sclera clear, conjunctiva pink Ears- hearing intact Oropharynx- clear Neck- supple  Lungs- Clear to ausculation bilaterally, normal work of breathing Heart- Regular rate and rhythm, no murmurs, rubs or gallops  GI- soft, NT, ND, + BS Extremities- no clubbing, cyanosis, or edema MS- no significant deformity or atrophy Skin- no rash or lesion Psych- euthymic mood, full affect Neuro- strength and sensation are intact  Wt Readings from Last 3 Encounters:  06/04/21 105.5  kg  05/21/21 104.8 kg  05/17/21 105.7 kg    EKG today demonstrates  SB, 1st degree AV block Vent. rate  54 BPM PR interval 206 ms QRS duration 86 ms QT/QTcB 440/417 ms  Echo 03/13/21 demonstrated   1. Left ventricular ejection fraction, by estimation, is 60 to 65%. The left ventricle has normal function. The left ventricle has no regional wall motion abnormalities. There is mild left ventricular hypertrophy.  Left ventricular diastolic parameters are consistent with Grade II diastolic dysfunction (pseudonormalization).   2. Right ventricular systolic function is normal. The right ventricular  size is normal.   3. Left atrial size was moderately dilated.   4. Right atrial size was moderately dilated.   5. The mitral valve is normal in structure. Trivial mitral valve  regurgitation. No evidence of mitral stenosis.   6. The aortic valve is tricuspid. There is moderate calcification of the aortic valve. Aortic valve regurgitation is trivial. Mild aortic valve  stenosis.   7. Aortic dilatation noted. There is mild dilatation of the ascending  aorta, measuring 40 mm.   8. The inferior vena cava is normal in size with greater than 50%  respiratory variability, suggesting right atrial pressure of 3 mmHg.   Epic records are reviewed at length today  CHA2DS2-VASc Score = 4  The patient's score is based upon: CHF History: 0 HTN History: 1 Diabetes History: 1 Stroke History: 0 Vascular Disease History: 1 Age Score: 1 Gender Score: 0       ASSESSMENT AND PLAN: 1. Paroxysmal Atrial Fibrillation (ICD10:  I48.0) The patient's CHA2DS2-VASc score is 4, indicating a 4.8% annual risk of stroke.   We discussed rhythm control options. His options are limited by his other comorbidities. Would avoid amiodarone and ablation for how given his ongoing workup/treatment for liver cancer. Continue with rate control for now.  Continue Eliquis 5 mg BID Continue carvedilol 37.5 mg BID  2. Secondary  Hypercoagulable State (ICD10:  D68.69) The patient is at significant risk for stroke/thromboembolism based upon his CHA2DS2-VASc Score of 4.  Continue Apixaban (Eliquis).   3. Obesity Body mass index is 33.37 kg/m. Lifestyle modification was discussed at length including regular exercise and weight reduction.  4. CAD No anginal symptoms.  5. HTN Stable, no changes today.  6. [place] Plans per oncology   Follow up with Dr Gwenlyn Found as scheduled. AF clinic in 6 months.    [facility] [address] [place], [place] [address] [phone] 06/04/2021 9:41 AM

## 2021-06-05 DIAGNOSIS — K5190 Ulcerative colitis, unspecified, without complications: Secondary | ICD-10-CM | POA: Diagnosis not present

## 2021-06-05 NOTE — Telephone Encounter (Signed)
Called pt for PV.  He notes that he has liver cancer and will be starting radiation therapy 2 days before the colonoscopy.  In discussing with pt, this was a recall and not scheduled at all by or recommended by his oncologist.  He does not who how long his treatment will last.  [facility] nurse discussed with patient the need to cancel procedure and to reschedule when he has finished treatments.  Pt voiced his understanding. Procedure cancelled at this time.

## 2021-06-06 ENCOUNTER — Other Ambulatory Visit: Payer: Self-pay | Admitting: Radiology

## 2021-06-06 ENCOUNTER — Ambulatory Visit: Payer: 59 | Admitting: Cardiovascular Disease

## 2021-06-08 ENCOUNTER — Other Ambulatory Visit (HOSPITAL_COMMUNITY): Payer: Self-pay | Admitting: Interventional Radiology

## 2021-06-08 ENCOUNTER — Encounter (HOSPITAL_COMMUNITY)
Admission: RE | Admit: 2021-06-08 | Discharge: 2021-06-08 | Disposition: A | Discharge status: Home / Self Care | Payer: HMO | Source: Ambulatory Visit | Attending: Interventional Radiology | Admitting: Interventional Radiology

## 2021-06-08 ENCOUNTER — Other Ambulatory Visit: Payer: Self-pay

## 2021-06-08 ENCOUNTER — Ambulatory Visit (HOSPITAL_COMMUNITY)
Admission: RE | Admit: 2021-06-08 | Discharge: 2021-06-08 | Disposition: A | Discharge status: Home / Self Care | Payer: HMO | Source: Ambulatory Visit | Attending: Interventional Radiology | Admitting: Interventional Radiology

## 2021-06-08 ENCOUNTER — Encounter (HOSPITAL_COMMUNITY): Payer: Self-pay

## 2021-06-08 DIAGNOSIS — Z7982 Long term (current) use of aspirin: Secondary | ICD-10-CM | POA: Diagnosis not present

## 2021-06-08 DIAGNOSIS — C220 Liver cell carcinoma: Secondary | ICD-10-CM

## 2021-06-08 DIAGNOSIS — Z7984 Long term (current) use of oral hypoglycemic drugs: Secondary | ICD-10-CM | POA: Diagnosis not present

## 2021-06-08 DIAGNOSIS — Z880 Allergy status to penicillin: Secondary | ICD-10-CM | POA: Diagnosis not present

## 2021-06-08 DIAGNOSIS — Z79899 Other long term (current) drug therapy: Secondary | ICD-10-CM | POA: Diagnosis not present

## 2021-06-08 DIAGNOSIS — R160 Hepatomegaly, not elsewhere classified: Secondary | ICD-10-CM | POA: Diagnosis not present

## 2021-06-08 DIAGNOSIS — Z7985 Long-term (current) use of injectable non-insulin antidiabetic drugs: Secondary | ICD-10-CM | POA: Insufficient documentation

## 2021-06-08 HISTORY — PX: IR EMBO ARTERIAL NOT HEMORR HEMANG INC GUIDE ROADMAPPING: IMG5448

## 2021-06-08 HISTORY — PX: IR ANGIOGRAM VISCERAL SELECTIVE: IMG657

## 2021-06-08 HISTORY — PX: IR ANGIOGRAM SELECTIVE EACH ADDITIONAL VESSEL: IMG667

## 2021-06-08 HISTORY — PX: IR US GUIDE VASC ACCESS RIGHT: IMG2390

## 2021-06-08 LAB — PROTIME-INR
INR: 1.1 (ref 0.8–1.2)
Prothrombin Time: 13.7 seconds (ref 11.4–15.2)

## 2021-06-08 LAB — COMPREHENSIVE METABOLIC PANEL
ALT: 28 U/L (ref 0–44)
AST: 32 U/L (ref 15–41)
Albumin: 3.7 g/dL (ref 3.5–5.0)
Alkaline Phosphatase: 78 U/L (ref 38–126)
Anion gap: 7 (ref 5–15)
BUN: 29 mg/dL — ABNORMAL HIGH (ref 8–23)
CO2: 23 mmol/L (ref 22–32)
Calcium: 8.9 mg/dL (ref 8.9–10.3)
Chloride: 109 mmol/L (ref 98–111)
Creatinine, Ser: 2.02 mg/dL — ABNORMAL HIGH (ref 0.61–1.24)
GFR, Estimated: 36 mL/min — ABNORMAL LOW (ref >60)
Glucose, Bld: 143 mg/dL — ABNORMAL HIGH (ref 70–99)
Potassium: 4.2 mmol/L (ref 3.5–5.1)
Sodium: 139 mmol/L (ref 135–145)
Total Bilirubin: 0.6 mg/dL (ref 0.3–1.2)
Total Protein: 7.3 g/dL (ref 6.5–8.1)

## 2021-06-08 LAB — CBC WITH DIFFERENTIAL/PLATELET
Abs Immature Granulocytes: 0.02 10*3/uL (ref 0.00–0.07)
Basophils Absolute: 0.1 10*3/uL (ref 0.0–0.1)
Basophils Relative: 1 %
Eosinophils Absolute: 0.2 10*3/uL (ref 0.0–0.5)
Eosinophils Relative: 4 %
HCT: 36.9 % — ABNORMAL LOW (ref 39.0–52.0)
Hemoglobin: 11.6 g/dL — ABNORMAL LOW (ref 13.0–17.0)
Immature Granulocytes: 0 %
Lymphocytes Relative: 23 %
Lymphs Abs: 1.2 10*3/uL (ref 0.7–4.0)
MCH: 28.2 pg (ref 26.0–34.0)
MCHC: 31.4 g/dL (ref 30.0–36.0)
MCV: 89.6 fL (ref 80.0–100.0)
Monocytes Absolute: 0.6 10*3/uL (ref 0.1–1.0)
Monocytes Relative: 10 %
Neutro Abs: 3.2 10*3/uL (ref 1.7–7.7)
Neutrophils Relative %: 62 %
Platelets: 279 10*3/uL (ref 150–400)
RBC: 4.12 MIL/uL — ABNORMAL LOW (ref 4.22–5.81)
RDW: 15.8 % — ABNORMAL HIGH (ref 11.5–15.5)
WBC: 5.3 10*3/uL (ref 4.0–10.5)
nRBC: 0 % (ref 0.0–0.2)

## 2021-06-08 LAB — GLUCOSE, CAPILLARY
Glucose-Capillary: 144 mg/dL — ABNORMAL HIGH (ref 70–99)
Glucose-Capillary: 70 mg/dL (ref 70–99)

## 2021-06-08 MED ORDER — FENTANYL CITRATE (PF) 100 MCG/2ML IJ SOLN
INTRAMUSCULAR | Status: AC | PRN
  Administered 2021-06-08: 50 ug via INTRAVENOUS

## 2021-06-08 MED ORDER — IODIXANOL 320 MG/ML IV SOLN
50.0000 mL | Freq: Once | INTRAVENOUS | Status: AC | PRN
  Administered 2021-06-08: 25 mL via INTRA_ARTERIAL

## 2021-06-08 MED ORDER — LIDOCAINE HCL (PF) 1 % IJ SOLN
INTRAMUSCULAR | Status: AC | PRN
  Administered 2021-06-08: 10 mL via INTRADERMAL

## 2021-06-08 MED ORDER — FENTANYL CITRATE (PF) 100 MCG/2ML IJ SOLN
INTRAMUSCULAR | Status: AC
  Filled 2021-06-08: qty 2

## 2021-06-08 MED ORDER — MIDAZOLAM HCL 2 MG/2ML IJ SOLN
INTRAMUSCULAR | Status: AC
  Filled 2021-06-08: qty 4

## 2021-06-08 MED ORDER — MIDAZOLAM HCL 2 MG/2ML IJ SOLN
INTRAMUSCULAR | Status: AC | PRN
  Administered 2021-06-08: 1 mg via INTRAVENOUS

## 2021-06-08 MED ORDER — LIDOCAINE HCL 1 % IJ SOLN
INTRAMUSCULAR | Status: AC
  Filled 2021-06-08: qty 20

## 2021-06-08 MED ORDER — TECHNETIUM TO 99M ALBUMIN AGGREGATED
4.3000 | Freq: Once | INTRAVENOUS | Status: AC
  Administered 2021-06-08: 4.3 via INTRAVENOUS

## 2021-06-08 MED ORDER — SODIUM CHLORIDE 0.9 % IV SOLN: INTRAVENOUS | Status: DC

## 2021-06-08 MED ORDER — SODIUM CHLORIDE 0.9 % IV BOLUS
500.0000 mL | Freq: Once | INTRAVENOUS | Status: AC
  Administered 2021-06-08: 500 mL via INTRAVENOUS

## 2021-06-08 NOTE — Procedures (Signed)
Interventional Radiology Procedure Note  Procedure:  1) Hepatic angiogram 2) Tc-MAA injection into right lobe of liver  Findings: Please refer to procedural dictation for full description. Right hepatic mass with supply from segment VI and VII branches.  Tc-MAA injection from common origin of right lateral hepatic artery.  Prominent right CFA atherosclerotic plaque.  Manual commpression.  Complications: None immediate  Estimated Blood Loss: < 5 mL  Recommendations: Strict 6 hour bedrest.  Head of bed flat for 4 hours, up to 30 degrees for final 2 hours. Plan for discharge home once bedrest complete.   Ruthann Cancer, MD Pager: [phone]

## 2021-06-08 NOTE — Discharge Instructions (Signed)
Interventional radiology phone numbers [phone] After hours [phone] Interventional radiology phone numbers [phone] After hours [phone]     Moderate Conscious Sedation, Adult, Care After This sheet gives you information about how to care for yourself after your procedure. Your health care provider may also give you more specific instructions. If you have problems or questions, contact your health care provider. What can I expect after the procedure? After the procedure, it is common to have: Sleepiness for several hours. Impaired judgment for several hours. Difficulty with balance. Vomiting if you eat too soon. Follow these instructions at home: For the time period you were told by your health care provider: Rest. Do not participate in activities where you could fall or become injured. Do not drive or use machinery. Do not drink alcohol. Do not take sleeping pills or medicines that cause drowsiness. Do not make important decisions or sign legal documents. Do not take care of children on your own.      Eating and drinking Follow the diet recommended by your health care provider. Drink enough fluid to keep your urine pale yellow. If you vomit: Drink water, juice, or soup when you can drink without vomiting. Make sure you have little or no nausea before eating solid foods.   General instructions Take over-the-counter and prescription medicines only as told by your health care provider. Have a responsible adult stay with you for the time you are told. It is important to have someone help care for you until you are awake and alert. Do not smoke. Keep all follow-up visits as told by your health care provider. This is important. Contact a health care provider if: You are still sleepy or having trouble with balance after 24 hours. You feel light-headed. You keep feeling nauseous or you keep vomiting. You develop a rash. You have a fever. You have redness or  swelling around the IV site. Get help right away if: You have trouble breathing. You have new-onset confusion at home. Summary After the procedure, it is common to feel sleepy, have impaired judgment, or feel nauseous if you eat too soon. Rest after you get home. Know the things you should not do after the procedure. Follow the diet recommended by your health care provider and drink enough fluid to keep your urine pale yellow. Get help right away if you have trouble breathing or new-onset confusion at home. This information is not intended to replace advice given to you by your health care provider. Make sure you discuss any questions you have with your health care provider. Document Revised: 11/26/2019 Document Reviewed: 06/24/2019 Elsevier Patient Education  2021 [employer].

## 2021-06-08 NOTE — Sedation Documentation (Signed)
Transferred patient from procedure table to stretcher via slide board with assistance of IR Techs. Right groin site soft, with no evidence of bleeding post transfer. Patient then transported to MI via stretcher by this RN and then transferred to MI table in same manner with assistance of MI Techs. VSS. Pateit alert, with no complaints and denies pain. Right groin site soft and with no evidence of bleeding post transfer.

## 2021-06-08 NOTE — H&P (Signed)
Referring Physician(s): Byerly,F  Supervising Physician: Ruthann Cancer  Patient Status:  [facility] OP  Chief Complaint: Hepatocellular carcinoma   Subjective: Patient known to IR service from right liver lesion biopsy on 04/24/2021 and consultation with Dr. Serafina Royals 05/08/2021 to discuss treatment options for hepatocellular carcinoma.  He has a 7 cm unifocal right HCC in the absence of significant liver disease.  He was deemed an appropriate candidate for hepatic Y 90 radioembolization and presents today for arterial roadmapping study, embolization and test Y 90 dosing.  He denies fever, headache, chest pain, dyspnea, cough, abdominal/back pain, nausea, vomiting or bleeding.  Additional  medical history as below.  Past Medical History:  Diagnosis Date   Adenomatous colon polyp 03/2008   Anginal pain ([place])    Aortic aneurysm, thoracic 09/09/2016   4.4 cm by echo 05/2015   Arthritis    "joints in my fingers ache" (07/13/2015)   Bicuspid aortic valve 09/09/2016   Cataract 2019   bilateral eyes   CKD (chronic kidney disease), stage III ([place])    Coronary artery disease    Diabetes mellitus without complication ([place])    Hemorrhoids    Hepatocellular carcinoma (HCC)    Hyperlipidemia    Hypertension    Paroxysmal atrial fibrillation ([place])    Peripheral arterial disease ([place])    a. dopppler 05/29/2015 revealed high-grade right popliteal and distal left SFA disease b. LE angio 06/28/2015 revealing high grade calcific dx with distal L SFA and R popliteal artery s/p diamondback orbital rotational atherectomy, PTA of L SFA  c. 07/13/2015 diamondback orbital rotational atherectomy and drug eluting balloon angioplasty of R popliteal artery (P2 segment) using distal protection    Type II diabetes mellitus ([place])    Ulcerative colitis    Past Surgical History:  Procedure Laterality Date   ABDOMINAL AORTOGRAM W/LOWER EXTREMITY N/A 04/27/2018   Procedure: ABDOMINAL AORTOGRAM W/LOWER EXTREMITY;   Surgeon: Lorretta Harp, MD;  Location: [facility];  Service: Cardiovascular;  Laterality: N/A;   COLONOSCOPY W/ POLYPECTOMY  X 4-5   CORONARY ATHERECTOMY N/A 02/15/2021   Procedure: CORONARY ATHERECTOMY;  Surgeon: Lorretta Harp, MD;  Location: [facility];  Service: Cardiovascular;  Laterality: N/A;  aborted    CORONARY BALLOON ANGIOPLASTY N/A 02/15/2021   Procedure: CORONARY BALLOON ANGIOPLASTY;  Surgeon: Lorretta Harp, MD;  Location: [facility];  Service: Cardiovascular;  Laterality: N/A;   COSMETIC SURGERY  < 2000   "removed birthmark from top of my head"   IR RADIOLOGIST EVAL & MGMT  05/08/2021   LEFT HEART CATH AND CORONARY ANGIOGRAPHY N/A 02/08/2021   Procedure: LEFT HEART CATH AND CORONARY ANGIOGRAPHY;  Surgeon: Lorretta Harp, MD;  Location: [facility];  Service: Cardiovascular;  Laterality: N/A;   PERIPHERAL VASCULAR ATHERECTOMY  04/27/2018   Procedure: PERIPHERAL VASCULAR ATHERECTOMY;  Surgeon: Lorretta Harp, MD;  Location: [facility];  Service: Cardiovascular;;  right popliteal artery   PERIPHERAL VASCULAR CATHETERIZATION N/A 06/29/2015   Procedure: Lower Extremity Angiography;  Surgeon: Lorretta Harp, MD;  Location: [facility];  Service: Cardiovascular;  Laterality: N/A;   PERIPHERAL VASCULAR CATHETERIZATION N/A 07/13/2015   Procedure: Lower Extremity Angiography;  Surgeon: Lorretta Harp, MD;  Location: [facility];  Service: Cardiovascular;  Laterality: N/A;   PERIPHERAL VASCULAR CATHETERIZATION  07/13/2015   Procedure: Peripheral Vascular Atherectomy;  Surgeon: Lorretta Harp, MD;  Location: [facility];  Service: Cardiovascular;;  PERIPHERAL VASCULAR INTERVENTION  04/27/2018   Procedure: PERIPHERAL VASCULAR INTERVENTION;  Surgeon: Lorretta Harp, MD;  Location: [facility];  Service: Cardiovascular;;  right popliteal artery   TONSILLECTOMY         Allergies: Penicillins  Medications: Prior to Admission medications   Medication Sig Start Date End Date Taking? Authorizing Provider  amLODipine (NORVASC) 5 MG tablet Take 1 tablet (5 mg total) by mouth daily. 03/19/21  Yes Susy Frizzle, MD  atorvastatin (LIPITOR) 80 MG tablet Take 1 tablet (80 mg total) by mouth daily. 02/18/21  Yes Kathyrn Drown D, NP  canagliflozin (INVOKANA) 300 MG TABS tablet Take 300 mg by mouth daily before breakfast.   Yes [provider]  carvedilol (COREG) 12.5 MG tablet Take 3 tablets (37.5 mg total) by mouth 2 (two) times daily. 03/19/21  Yes Susy Frizzle, MD  empagliflozin (JARDIANCE) 25 MG TABS tablet Take 1 tablet (25 mg total) by mouth daily before breakfast. 03/19/21  Yes Susy Frizzle, MD  fenofibrate 160 MG tablet Take 1 tablet (160 mg total) by mouth daily. 03/19/21  Yes Susy Frizzle, MD  glipiZIDE (GLUCOTROL XL) 5 MG 24 hr tablet TAKE 1 TABLET BY MOUTH EVERY DAY WITH BREAKFAST 03/28/21  Yes Susy Frizzle, MD  liraglutide (VICTOZA) 18 MG/3ML SOPN INJECT 1.2 MG UNDER THE SKIN ONCE DAILY 03/19/21  Yes Susy Frizzle, MD  lisinopril-hydrochlorothiazide (ZESTORETIC) 20-25 MG tablet Take 1 tablet by mouth daily. 03/19/21  Yes Susy Frizzle, MD  Omega-3 Fatty Acids (FISH OIL) 1000 MG CAPS Take 1,000 mg by mouth 2 (two) times daily.   Yes [provider]  pantoprazole (PROTONIX) 40 MG tablet Take 1 tablet (40 mg total) by mouth 2 (two) times daily. 03/19/21  Yes Susy Frizzle, MD  pioglitazone (ACTOS) 30 MG tablet TAKE 1 TABLET BY MOUTH EVERY DAY STOP TRADJENTA 03/19/21  Yes Susy Frizzle, MD  saccharomyces boulardii (FLORASTOR) 250 MG capsule Take 1 capsule (250 mg total) by mouth 2 (two) times daily. 05/22/21 07/03/21 Yes [name], MD  acetaminophen (TYLENOL) 500 MG tablet Take 1,000 mg by mouth every 6 (six) hours as needed for moderate pain or headache.    [provider]  apixaban (ELIQUIS) 5 MG  TABS tablet Take 1 tablet (5 mg total) by mouth 2 (two) times daily. 03/19/21   Susy Frizzle, MD  aspirin EC 81 MG tablet Take 1 tablet (81 mg total) by mouth daily. 06/01/15   Skeet Latch, MD  Continuous Blood Gluc Sensor (FREESTYLE LIBRE 2 SENSOR) MISC CHECK BLOOD SUGAR FOUR TIMES DAILY 03/28/21   Susy Frizzle, MD  glucose blood (ONETOUCH ULTRA) test strip CHECK BLOOD SUGAR TWICE A DAY 03/19/21   Susy Frizzle, MD  Insulin Pen Needle (B-D UF III MINI PEN NEEDLES) 31G X 5 MM MISC USE DAILY WITH VICTOZA 03/19/21   Susy Frizzle, MD  loperamide (IMODIUM A-D) 2 MG tablet Take 4 mg by mouth 4 (four) times daily as needed for diarrhea or loose stools.    [provider]  vedolizumab (ENTYVIO) 300 MG injection Inject 300 mg as directed See admin instructions. Every 2 months    [provider]     Vital Signs: BP 116/73   Pulse (!) 54   Temp 97.9 F (36.6 C) (Oral)   Resp 16   SpO2 98%   Physical Exam awake, alert.  Chest clear to auscultation bilaterally.  Heart with slightly  bradycardic but regular rhythm.  Abdomen soft, positive bowel sounds, nontender.  No lower extremity edema.  Imaging: No results found.  Labs:  CBC: Recent Labs    03/02/21 0807 04/17/21 1401 04/24/21 0610 06/08/21 0800  WBC 4.5 5.0 5.2 5.3  HGB 11.1* 11.5* 11.6* 11.6*  HCT 34.4* 35.8* 36.7* 36.9*  PLT 251 290.0 278 279    COAGS: Recent Labs    04/24/21 0610 06/08/21 0800  INR 1.0 1.1    BMP: Recent Labs    11/20/20 0830 01/17/21 1454 02/15/21 0606 02/16/21 0055 03/02/21 0807 04/17/21 1401 06/08/21 0800  NA 140   < > 140 136 139 139 139  K 4.7   < > 4.2 4.4 4.3 4.1 4.2  CL 104   < > 106 103 104 107 109  CO2 25   < > 27 26 27 23 23   GLUCOSE 148*   < > 194* 173* 174* 118* 143*  BUN 25   < > 26* 23 29* 26* 29*  CALCIUM 9.4   < > 9.1 8.7* 9.5 9.2 8.9  CREATININE 1.67*   < > 1.88* 1.79* 2.03* 1.98* 2.02*  GFRNONAA 42*  --  39* 42*  --   --  36*  GFRAA 49*   --   --   --   --   --   --    < > = values in this interval not displayed.    LIVER FUNCTION TESTS: Recent Labs    11/20/20 0830 03/02/21 0807 04/17/21 1401 06/08/21 0800  BILITOT 0.5 0.5 0.4 0.6  AST 24 24 22  32  ALT 29 28 24 28   ALKPHOS  --   --  57 78  PROT 6.7 6.8 7.2 7.3  ALBUMIN  --   --  4.0 3.7    Assessment and Plan: Patient known to IR service from right liver lesion biopsy on 04/24/2021 and consultation with Dr. Serafina Royals 05/08/2021 to discuss treatment options for hepatocellular carcinoma.  He has a 7 cm unifocal right HCC in the absence of significant liver disease.  He was deemed an appropriate candidate for hepatic Y 90 radioembolization and presents today for arterial roadmapping study, embolization and test Y 90 dosing.  Past medical history also significant for thoracic aortic aneurysm, arthritis, bicuspid aortic valve, chronic kidney disease, coronary artery disease, diabetes, hypertension, hyperlipidemia, paroxysmal atrial fibrillation, peripheral arterial disease and ulcerative colitis .Risks and benefits of procedure were discussed with the patient including, but not limited to bleeding, infection, vascular injury or contrast induced renal failure.  This interventional procedure involves the use of X-rays and because of the nature of the planned procedure, it is possible that we will have prolonged use of X-ray fluoroscopy.  Potential radiation risks to you include (but are not limited to) the following: - A slightly elevated risk for cancer  several years later in life. This risk is typically less than 0.5% percent. This risk is low in comparison to the normal incidence of human cancer, which is 33% for women and 50% for men according to the [place]. - Radiation induced injury can include skin redness, resembling a rash, tissue breakdown / ulcers and hair loss (which can be temporary or permanent).   The likelihood of either of these occurring depends  on the difficulty of the procedure and whether you are sensitive to radiation due to previous procedures, disease, or genetic conditions.   IF your procedure requires a prolonged use of radiation, you will be  notified and given written instructions for further action.  It is your responsibility to monitor the irradiated area for the 2 weeks following the procedure and to notify your physician if you are concerned that you have suffered a radiation induced injury.    All of the patient's questions were answered, patient is agreeable to proceed.  Consent signed and in chart.      Electronically Signed: D. Rowe Robert, [name] 06/08/2021, 8:37 AM   I spent a total of  25 minutes at the the patient's bedside AND on the patient's hospital floor or unit, greater than 50% of which was counseling/coordinating care for visceral/hepatic arteriogram with embolization, test Y 90 dosing

## 2021-06-08 NOTE — Progress Notes (Signed)
Reviewed discharge instructions with patient and Mrs Dubie over the telephone. Mrs azzam mehra understanding. Included teaching of radioablation which pt is scheduled for on 06/21/21 which will allow family to make arrangements for separate sleeping accommodations and  for care of their great Grandchild's care.  HOB elevated to 30 degrees.

## 2021-06-20 ENCOUNTER — Encounter: Payer: Self-pay | Admitting: Gastroenterology

## 2021-06-20 ENCOUNTER — Other Ambulatory Visit: Payer: HMO

## 2021-06-20 ENCOUNTER — Other Ambulatory Visit: Payer: Self-pay | Admitting: Family Medicine

## 2021-06-20 ENCOUNTER — Ambulatory Visit: Payer: HMO | Admitting: Gastroenterology

## 2021-06-20 ENCOUNTER — Other Ambulatory Visit (INDEPENDENT_AMBULATORY_CARE_PROVIDER_SITE_OTHER): Payer: HMO

## 2021-06-20 ENCOUNTER — Telehealth: Payer: Self-pay

## 2021-06-20 ENCOUNTER — Other Ambulatory Visit: Payer: Self-pay | Admitting: Student

## 2021-06-20 VITALS — BP 118/68 | HR 55 | Ht 70.0 in | Wt 230.0 lb

## 2021-06-20 DIAGNOSIS — Z7901 Long term (current) use of anticoagulants: Secondary | ICD-10-CM | POA: Diagnosis not present

## 2021-06-20 DIAGNOSIS — K51919 Ulcerative colitis, unspecified with unspecified complications: Secondary | ICD-10-CM | POA: Diagnosis not present

## 2021-06-20 DIAGNOSIS — E1169 Type 2 diabetes mellitus with other specified complication: Secondary | ICD-10-CM

## 2021-06-20 DIAGNOSIS — C220 Liver cell carcinoma: Secondary | ICD-10-CM | POA: Diagnosis not present

## 2021-06-20 LAB — SEDIMENTATION RATE: Sed Rate: 54 mm/hr — ABNORMAL HIGH (ref 0–20)

## 2021-06-20 LAB — C-REACTIVE PROTEIN: CRP: 1.2 mg/dL (ref 0.5–20.0)

## 2021-06-20 MED ORDER — PREDNISONE 10 MG PO TABS: ORAL_TABLET | ORAL | 0 refills | Status: DC

## 2021-06-20 MED ORDER — GLYCOPYRROLATE 1 MG PO TABS: 1.0000 mg | ORAL_TABLET | Freq: Two times a day (BID) | ORAL | 11 refills | Status: DC | PRN

## 2021-06-20 MED ORDER — NA SULFATE-K SULFATE-MG SULF 17.5-3.13-1.6 GM/177ML PO SOLN: 1.0000 | Freq: Once | ORAL | 0 refills | Status: AC

## 2021-06-20 NOTE — Telephone Encounter (Signed)
Pharmacy, can you please comment on how long Eliquis can be held for upcoming colonoscopy?  Thank you!

## 2021-06-20 NOTE — Telephone Encounter (Signed)
[facility] Pre-operative Risk Assessment     Request for surgical clearance:     Endoscopy Procedure  What type of surgery is being performed?     Colonoscopy  When is this surgery scheduled?     07/11/21  What type of clearance is required ?   Pharmacy  Are there any medications that need to be held prior to surgery and how long? Eliquis x 2 days  Practice name and name of physician performing surgery?      [facility]  What is your office phone and fax number?      Phone- [phone]  [phone]  Anesthesia type (None, local, MAC, general) ?       MAC

## 2021-06-20 NOTE — Patient Instructions (Signed)
Your provider has requested that you go to the basement level for lab work before leaving today. Press "B" on the elevator. The lab is located at the first door on the left as you exit the elevator.   Dr Fuller Plan has advised that you be on a prednisone taper. The taper instructions are as follows: Prednisone 40 mg daily x 7 days Prednisone 30 mg daily x 7 days Prednisone 20 mg daily until colonoscopy.   We will send #65 tablets to your pharmacy to make sure you have enough until your colonoscopy.  We have sent the following medications to your pharmacy for you to pick up at your convenience: Prednisone 10 mg tablets and robinul 1 mg.   You have been scheduled for a colonoscopy. Please follow written instructions given to you at your visit today.  Please pick up your prep supplies at the pharmacy within the next 1-3 days. If you use inhalers (even only as needed), please bring them with you on the day of your procedure.  You will be contaced by our office prior to your procedure for directions on holding your Eliquis..  If you do not hear from our office 1 week prior to your scheduled procedure, please call [phone] to discuss.  Due to recent changes in healthcare laws, you may see the results of your imaging and laboratory studies on MyChart before your provider has had a chance to review them.  We understand that in some cases there may be results that are confusing or concerning to you. Not all laboratory results come back in the same time frame and the provider may be waiting for multiple results in order to interpret others.  Please give [place] 48 hours in order for your provider to thoroughly review all the results before contacting the office for clarification of your results.    Thank you for choosing me and [facility].  Pricilla Riffle. Dagoberto Ligas., MD., Mar[facility] Regal

## 2021-06-20 NOTE — Progress Notes (Signed)
    History of Present Illness: This is a 65 year old male returning for follow-up of left-sided ulcerative colitis.  Unfortunately his diarrhea continues to worsen.  He relates having 7-10 loose watery urgent bowel movements per day. He has 1-2 bowel movements overnight.  Some bowel movements are small volume some are larger volume.  He notes frequent crampy lower abdominal pain.  He has been maintained on Entyvio for about 3 years and up until recently had very good control of his disease. Gastrointestinal pathogen panel was negative 1 month ago.   Current Medications, Allergies, Past Medical History, Past Surgical History, Family History and Social History were reviewed in [employer] record.   Physical Exam: General: Well developed, well nourished, no acute distress Head: Normocephalic and atraumatic Eyes: Sclerae anicteric, EOMI Ears: Normal auditory acuity Mouth: Not examined, mask on during Covid-19 pandemic Lungs: Clear throughout to auscultation Heart: Regular rate and rhythm; no murmurs, rubs or bruits Abdomen: Soft, non tender and non distended. No masses, hepatosplenomegaly or hernias noted. Normal Bowel sounds Rectal: Deferred to colonoscopy  Musculoskeletal: Symmetrical with no gross deformities  Pulses:  Normal pulses noted Extremities: No clubbing, cyanosis, edema or deformities noted Neurological: Alert oriented x 4, grossly nonfocal Psychological:  Alert and cooperative. Normal mood and affect   Assessment and Recommendations:  Left sided ulcerative colitis with worsening diarrhea on Entyvio.  Begin prednisone 40 mg qd with a 10 mg/week taper to 20 mg qd and then maintain.  Begin glycopyrrolate 1 mg p.o. twice daily as needed abdominal pain, abdominal cramping.  Obtain fecal calprotectin, ESR, CRP today.  Schedule colonoscopy for further evaluation.  We discussed that he will likely require a change in ulcerative colitis treatment and we discussed a  few potential options.  Further plans pending findings of colonoscopy. The risks (including bleeding, perforation, infection, missed lesions, medication reactions and possible hospitalization or surgery if complications occur), benefits, and alternatives to colonoscopy with possible biopsy and possible polypectomy were discussed with the patient and they consent to proceed.   Hepatocellular carcinoma without evidence of metastatic disease.  He is not felt to be a partial hepatectomy candidate or a liver transplant candidate following evaluation by Dr. Barry Dienes.  He is followed by Dr. Benay Spice.  He is scheduled for radioembolization segmentectomy. AFP= 2.2 in October.

## 2021-06-21 ENCOUNTER — Other Ambulatory Visit (HOSPITAL_COMMUNITY): Payer: Self-pay | Admitting: Interventional Radiology

## 2021-06-21 ENCOUNTER — Ambulatory Visit (HOSPITAL_COMMUNITY)
Admission: RE | Admit: 2021-06-21 | Discharge: 2021-06-21 | Disposition: A | Discharge status: Home / Self Care | Payer: HMO | Source: Ambulatory Visit | Attending: Interventional Radiology | Admitting: Interventional Radiology

## 2021-06-21 ENCOUNTER — Encounter (HOSPITAL_COMMUNITY): Payer: Self-pay

## 2021-06-21 ENCOUNTER — Encounter (HOSPITAL_COMMUNITY)
Admission: RE | Admit: 2021-06-21 | Discharge: 2021-06-21 | Disposition: A | Discharge status: Home / Self Care | Payer: HMO | Source: Ambulatory Visit | Attending: Interventional Radiology | Admitting: Interventional Radiology

## 2021-06-21 ENCOUNTER — Other Ambulatory Visit: Payer: Self-pay

## 2021-06-21 DIAGNOSIS — N183 Chronic kidney disease, stage 3 unspecified: Secondary | ICD-10-CM | POA: Insufficient documentation

## 2021-06-21 DIAGNOSIS — C220 Liver cell carcinoma: Secondary | ICD-10-CM

## 2021-06-21 DIAGNOSIS — E1122 Type 2 diabetes mellitus with diabetic chronic kidney disease: Secondary | ICD-10-CM | POA: Insufficient documentation

## 2021-06-21 DIAGNOSIS — I480 Paroxysmal atrial fibrillation: Secondary | ICD-10-CM | POA: Insufficient documentation

## 2021-06-21 DIAGNOSIS — I714 Abdominal aortic aneurysm, without rupture, unspecified: Secondary | ICD-10-CM | POA: Insufficient documentation

## 2021-06-21 DIAGNOSIS — E785 Hyperlipidemia, unspecified: Secondary | ICD-10-CM | POA: Insufficient documentation

## 2021-06-21 DIAGNOSIS — I129 Hypertensive chronic kidney disease with stage 1 through stage 4 chronic kidney disease, or unspecified chronic kidney disease: Secondary | ICD-10-CM | POA: Insufficient documentation

## 2021-06-21 DIAGNOSIS — E1151 Type 2 diabetes mellitus with diabetic peripheral angiopathy without gangrene: Secondary | ICD-10-CM | POA: Diagnosis not present

## 2021-06-21 DIAGNOSIS — I2510 Atherosclerotic heart disease of native coronary artery without angina pectoris: Secondary | ICD-10-CM | POA: Diagnosis not present

## 2021-06-21 HISTORY — PX: IR ANGIOGRAM SELECTIVE EACH ADDITIONAL VESSEL: IMG667

## 2021-06-21 HISTORY — PX: IR EMBO TUMOR ORGAN ISCHEMIA INFARCT INC GUIDE ROADMAPPING: IMG5449

## 2021-06-21 HISTORY — PX: IR US GUIDE VASC ACCESS LEFT: IMG2389

## 2021-06-21 HISTORY — PX: IR ANGIOGRAM VISCERAL SELECTIVE: IMG657

## 2021-06-21 LAB — CBC WITH DIFFERENTIAL/PLATELET
Abs Immature Granulocytes: 0.03 10*3/uL (ref 0.00–0.07)
Basophils Absolute: 0 10*3/uL (ref 0.0–0.1)
Basophils Relative: 0 %
Eosinophils Absolute: 0 10*3/uL (ref 0.0–0.5)
Eosinophils Relative: 0 %
HCT: 36.8 % — ABNORMAL LOW (ref 39.0–52.0)
Hemoglobin: 11.6 g/dL — ABNORMAL LOW (ref 13.0–17.0)
Immature Granulocytes: 0 %
Lymphocytes Relative: 11 %
Lymphs Abs: 0.8 10*3/uL (ref 0.7–4.0)
MCH: 28.2 pg (ref 26.0–34.0)
MCHC: 31.5 g/dL (ref 30.0–36.0)
MCV: 89.3 fL (ref 80.0–100.0)
Monocytes Absolute: 0.4 10*3/uL (ref 0.1–1.0)
Monocytes Relative: 6 %
Neutro Abs: 6 10*3/uL (ref 1.7–7.7)
Neutrophils Relative %: 83 %
Platelets: 299 10*3/uL (ref 150–400)
RBC: 4.12 MIL/uL — ABNORMAL LOW (ref 4.22–5.81)
RDW: 16.2 % — ABNORMAL HIGH (ref 11.5–15.5)
WBC: 7.3 10*3/uL (ref 4.0–10.5)
nRBC: 0 % (ref 0.0–0.2)

## 2021-06-21 LAB — COMPREHENSIVE METABOLIC PANEL
ALT: 26 U/L (ref 0–44)
AST: 24 U/L (ref 15–41)
Albumin: 3.8 g/dL (ref 3.5–5.0)
Alkaline Phosphatase: 55 U/L (ref 38–126)
Anion gap: 10 (ref 5–15)
BUN: 31 mg/dL — ABNORMAL HIGH (ref 8–23)
CO2: 18 mmol/L — ABNORMAL LOW (ref 22–32)
Calcium: 9.4 mg/dL (ref 8.9–10.3)
Chloride: 111 mmol/L (ref 98–111)
Creatinine, Ser: 1.79 mg/dL — ABNORMAL HIGH (ref 0.61–1.24)
GFR, Estimated: 42 mL/min — ABNORMAL LOW (ref >60)
Glucose, Bld: 118 mg/dL — ABNORMAL HIGH (ref 70–99)
Potassium: 4.3 mmol/L (ref 3.5–5.1)
Sodium: 139 mmol/L (ref 135–145)
Total Bilirubin: 0.5 mg/dL (ref 0.3–1.2)
Total Protein: 7.6 g/dL (ref 6.5–8.1)

## 2021-06-21 LAB — PROTIME-INR
INR: 1 (ref 0.8–1.2)
Prothrombin Time: 13.1 seconds (ref 11.4–15.2)

## 2021-06-21 LAB — GLUCOSE, CAPILLARY
Glucose-Capillary: 122 mg/dL — ABNORMAL HIGH (ref 70–99)
Glucose-Capillary: 99 mg/dL (ref 70–99)
Glucose-Capillary: 87 mg/dL (ref 70–99)
Glucose-Capillary: 150 mg/dL — ABNORMAL HIGH (ref 70–99)

## 2021-06-21 MED ORDER — LEVOFLOXACIN IN D5W 500 MG/100ML IV SOLN
500.0000 mg | INTRAVENOUS | Status: AC
  Administered 2021-06-21: 500 mg via INTRAVENOUS
  Filled 2021-06-21: qty 100

## 2021-06-21 MED ORDER — ONDANSETRON 4 MG PO TBDP: 4.0000 mg | ORAL_TABLET | Freq: Three times a day (TID) | ORAL | 0 refills | Status: DC | PRN

## 2021-06-21 MED ORDER — ONDANSETRON HCL 4 MG/2ML IJ SOLN
8.0000 mg | Freq: Once | INTRAMUSCULAR | Status: AC
  Administered 2021-06-21: 8 mg via INTRAVENOUS
  Filled 2021-06-21: qty 4

## 2021-06-21 MED ORDER — METRONIDAZOLE 500 MG/100ML IV SOLN
500.0000 mg | INTRAVENOUS | Status: AC
  Administered 2021-06-21: 500 mg via INTRAVENOUS
  Filled 2021-06-21: qty 100

## 2021-06-21 MED ORDER — METRONIDAZOLE 500 MG PO TABS: 500.0000 mg | ORAL_TABLET | Freq: Two times a day (BID) | ORAL | 0 refills | Status: AC

## 2021-06-21 MED ORDER — YTTRIUM 90 INJECTION
35.5000 | INJECTION | Freq: Once | INTRAVENOUS | Status: AC
  Administered 2021-06-21: 35.5 via INTRAVENOUS

## 2021-06-21 MED ORDER — MIDAZOLAM HCL 2 MG/2ML IJ SOLN
INTRAMUSCULAR | Status: AC
  Filled 2021-06-21: qty 2

## 2021-06-21 MED ORDER — LIDOCAINE HCL (PF) 1 % IJ SOLN
INTRAMUSCULAR | Status: AC | PRN
  Administered 2021-06-21: 5 mL via INTRADERMAL

## 2021-06-21 MED ORDER — FENTANYL CITRATE (PF) 100 MCG/2ML IJ SOLN
INTRAMUSCULAR | Status: AC | PRN
  Administered 2021-06-21: 50 ug via INTRAVENOUS

## 2021-06-21 MED ORDER — SODIUM CHLORIDE 0.9 % IV SOLN: INTRAVENOUS | Status: DC

## 2021-06-21 MED ORDER — IOHEXOL 300 MG/ML  SOLN
100.0000 mL | Freq: Once | INTRAMUSCULAR | Status: AC | PRN
  Administered 2021-06-21: 10 mL via INTRA_ARTERIAL

## 2021-06-21 MED ORDER — PANTOPRAZOLE SODIUM 40 MG IV SOLR
40.0000 mg | Freq: Once | INTRAVENOUS | Status: AC
  Administered 2021-06-21: 40 mg via INTRAVENOUS
  Filled 2021-06-21: qty 40

## 2021-06-21 MED ORDER — FENTANYL CITRATE (PF) 100 MCG/2ML IJ SOLN
INTRAMUSCULAR | Status: AC
  Filled 2021-06-21: qty 2

## 2021-06-21 MED ORDER — MIDAZOLAM HCL 2 MG/2ML IJ SOLN
INTRAMUSCULAR | Status: AC | PRN
  Administered 2021-06-21: 1 mg via INTRAVENOUS

## 2021-06-21 MED ORDER — CIPROFLOXACIN HCL 500 MG PO TABS: 500.0000 mg | ORAL_TABLET | Freq: Two times a day (BID) | ORAL | 0 refills | Status: AC

## 2021-06-21 MED ORDER — LIDOCAINE HCL (PF) 1 % IJ SOLN
INTRAMUSCULAR | Status: AC | PRN
  Administered 2021-06-21: 10 mL via INTRADERMAL

## 2021-06-21 MED ORDER — LIDOCAINE HCL 1 % IJ SOLN
INTRAMUSCULAR | Status: AC
  Filled 2021-06-21: qty 20

## 2021-06-21 MED ORDER — IODIXANOL 320 MG/ML IV SOLN
50.0000 mL | Freq: Once | INTRAVENOUS | Status: AC | PRN
  Administered 2021-06-21: 20 mL via INTRA_ARTERIAL

## 2021-06-21 MED ORDER — DEXAMETHASONE SODIUM PHOSPHATE 10 MG/ML IJ SOLN
8.0000 mg | INTRAMUSCULAR | Status: AC
  Administered 2021-06-21: 8 mg via INTRAVENOUS
  Filled 2021-06-21: qty 1

## 2021-06-21 NOTE — Telephone Encounter (Addendum)
Patient with diagnosis of afib on Eliquis for anticoagulation.    Procedure: colonoscopy Date of procedure: 07/11/21  CHA2DS2-VASc Score = 4  This indicates a 4.8% annual risk of stroke. The patient's score is based upon: CHF History: 0 HTN History: 1 Diabetes History: 1 Stroke History: 0 Vascular Disease History: 1 Age Score: 1 Gender Score: 0   CrCl 2m/min using adjusted body weight Platelet count 299K  Per office protocol, patient can hold Eliquis for 2 days prior to procedure as requested.

## 2021-06-21 NOTE — Progress Notes (Signed)
Pt took 30m Protonix, 584mGlipizide and 2592mardiance PO this am @ [id number] to arrival. Provider notified.

## 2021-06-21 NOTE — Discharge Instructions (Signed)
Urgent needs - Interventional Radiology on call MD [phone]  Wound - May remove dressing and shower in 24 to 48 hours.  Keep site clean and dry.  Replace with bandaid as needed.  Do not submerge in tub or water until site healing well. If closed with glue, glue will flake off on its own.   Post Y-90 Radioembolization Discharge Instructions  You have been given a radioactive material during your procedure.  While it is safe for you to be discharged home from the hospital, you need to proceed directly home.    Do not use public transportation, including air travel, lasting more than 2 hours for 1 week.  Avoid crowded public places for 1 week.  Adult visitors should try to avoid close contact with you for 1 week.    Children and pregnant females should not visit or have close contact with you for 1 week.  Items that you touch are not radioactive.  Do not sleep in the same bed as your partner for 1 week, and a condom should be used for sexual activity during the first 24 hours.  Your blood may be radioactive and caution should be used if any bleeding occurs during the recovery period.  Body fluids may be radioactive for 24 hours.  Wash your hands after voiding.  Men should sit to urinate.  Dispose of any soiled materials (flush down toilet or place in trash at home) during the first day.  Drink 6 to 8 glasses of fluids per day for 5 days to hydrate yourself.  If you need to see a doctor during the first week, you must let them know that you were treated with yttrium-90 microspheres, and will be slightly radioactive.  They can call Interventional Radiology [phone] with any questions.

## 2021-06-21 NOTE — H&P (Signed)
Chief Complaint: Patient was seen in consultation today for image guided transarterial embolization of hepatocellular carcinoma using Y-90 at the request of Suttle,Dylan J  Referring Physician(s): Suzette Battiest  Supervising Physician: Ruthann Cancer  Patient Status: [facility]- outpatient  History of Present Illness: Matthew Chen is a 65 y.o. male with PMH of AAA, arthritis, CKD, CAD, DM type II, HLD, HTN, paroxysmal A. fib, PAD and hepatocellular carcinoma. Patient had right liver lesion biopsy on 04/24/2021 and consultation with Dr. Serafina Royals 05/08/2021 to discuss treatment options for hepatocellular carcinoma.  Patient had arterial red mapping study, embolization and Test Y 90 dosing 06/08/2021.  Patient is here today for radioembolization therapy of hepatic lesions.  Past Medical History:  Diagnosis Date  . Adenomatous colon polyp 03/2008  . Anginal pain ([place])   . Aortic aneurysm, thoracic 09/09/2016   4.4 cm by echo 05/2015  . Arthritis    "joints in my fingers ache" (07/13/2015)  . Bicuspid aortic valve 09/09/2016  . Cataract 2019   bilateral eyes  . CKD (chronic kidney disease), stage III ([place])   . Coronary artery disease   . Diabetes mellitus without complication ([place])   . Hemorrhoids   . Hepatocellular carcinoma ([place])   . Hyperlipidemia   . Hypertension   . Paroxysmal atrial fibrillation (H[facility])   . Peripheral arterial disease ([place])    a. dopppler 05/29/2015 revealed high-grade right popliteal and distal left SFA disease b. LE angio 06/28/2015 revealing high grade calcific dx with distal L SFA and R popliteal artery s/p diamondback orbital rotational atherectomy, PTA of L SFA  c. 07/13/2015 diamondback orbital rotational atherectomy and drug eluting balloon angioplasty of R popliteal artery (P2 segment) using distal protection   . Type II diabetes mellitus ([place])   . Ulcerative colitis     Past Surgical History:  Procedure Laterality Date  . ABDOMINAL AORTOGRAM W/LOWER  EXTREMITY N/A 04/27/2018   Procedure: ABDOMINAL AORTOGRAM W/LOWER EXTREMITY;  Surgeon: Lorretta Harp, MD;  Location: [place] [facility];  Service: Cardiovascular;  Laterality: N/A;  . COLONOSCOPY W/ POLYPECTOMY  X 4-5  . CORONARY ATHERECTOMY N/A 02/15/2021   Procedure: CORONARY ATHERECTOMY;  Surgeon: Lorretta Harp, MD;  Location: [place] [facility];  Service: Cardiovascular;  Laterality: N/A;  aborted   . CORONARY BALLOON ANGIOPLASTY N/A 02/15/2021   Procedure: CORONARY BALLOON ANGIOPLASTY;  Surgeon: Lorretta Harp, MD;  Location: [place] [facility];  Service: Cardiovascular;  Laterality: N/A;  . COSMETIC SURGERY  < 2000   "removed birthmark from top of my head"  . IR ANGIOGRAM SELECTIVE EACH ADDITIONAL VESSEL  06/08/2021  . IR ANGIOGRAM SELECTIVE EACH ADDITIONAL VESSEL  06/08/2021  . IR ANGIOGRAM SELECTIVE EACH ADDITIONAL VESSEL  06/08/2021  . IR ANGIOGRAM SELECTIVE EACH ADDITIONAL VESSEL  06/08/2021  . IR ANGIOGRAM VISCERAL SELECTIVE  06/08/2021  . IR EMBO ARTERIAL NOT HEMORR HEMANG INC GUIDE ROADMAPPING  06/08/2021  . IR RADIOLOGIST EVAL & MGMT  05/08/2021  . IR US GUIDE VASC ACCESS RIGHT  06/08/2021  . LEFT HEART CATH AND CORONARY ANGIOGRAPHY N/A 02/08/2021   Procedure: LEFT HEART CATH AND CORONARY ANGIOGRAPHY;  Surgeon: Lorretta Harp, MD;  Location: [place] [facility];  Service: Cardiovascular;  Laterality: N/A;  . PERIPHERAL VASCULAR ATHERECTOMY  04/27/2018   Procedure: PERIPHERAL VASCULAR ATHERECTOMY;  Surgeon: Lorretta Harp, MD;  Location: [place] [facility];  Service: Cardiovascular;;  right popliteal artery  . PERIPHERAL VASCULAR CATHETERIZATION N/A 06/29/2015   Procedure: Lower Extremity Angiography;  Surgeon: Lorretta Harp, MD;  Location: [place] [facility];  Service: Cardiovascular;  Laterality: N/A;  . PERIPHERAL VASCULAR CATHETERIZATION N/A 07/13/2015   Procedure: Lower Extremity Angiography;  Surgeon: Lorretta Harp, MD;  Location: [place] [facility];  Service:  Cardiovascular;  Laterality: N/A;  . PERIPHERAL VASCULAR CATHETERIZATION  07/13/2015   Procedure: Peripheral Vascular Atherectomy;  Surgeon: Lorretta Harp, MD;  Location: [place] [facility];  Service: Cardiovascular;;  . PERIPHERAL VASCULAR INTERVENTION  04/27/2018   Procedure: PERIPHERAL VASCULAR INTERVENTION;  Surgeon: Lorretta Harp, MD;  Location: [place] [facility];  Service: Cardiovascular;;  right popliteal artery  . TONSILLECTOMY      Allergies: Penicillins  Medications: Prior to Admission medications   Medication Sig Start Date End Date Taking? Authorizing Provider  acetaminophen (TYLENOL) 500 MG tablet Take 1,000 mg by mouth every 6 (six) hours as needed for moderate pain or headache.   Yes [provider]  amLODipine (NORVASC) 5 MG tablet Take 1 tablet (5 mg total) by mouth daily. 03/19/21  Yes Susy Frizzle, MD  atorvastatin (LIPITOR) 80 MG tablet Take 1 tablet (80 mg total) by mouth daily. 02/18/21  Yes Kathyrn Drown D, NP  B-D UF III MINI PEN NEEDLES 31G X 5 MM MISC USE DAILY WITH VICTOZA 06/20/21  Yes Susy Frizzle, MD  carvedilol (COREG) 12.5 MG tablet Take 3 tablets (37.5 mg total) by mouth 2 (two) times daily. 03/19/21  Yes Susy Frizzle, MD  empagliflozin (JARDIANCE) 25 MG TABS tablet Take 1 tablet (25 mg total) by mouth daily before breakfast. 03/19/21  Yes Susy Frizzle, MD  fenofibrate 160 MG tablet Take 1 tablet (160 mg total) by mouth daily. 03/19/21  Yes Susy Frizzle, MD  glipiZIDE (GLUCOTROL XL) 5 MG 24 hr tablet TAKE 1 TABLET BY MOUTH EVERY DAY WITH BREAKFAST 03/28/21  Yes Susy Frizzle, MD  glucose blood (ONETOUCH ULTRA) test strip CHECK BLOOD SUGAR TWICE A DAY 03/19/21  Yes Susy Frizzle, MD  liraglutide (VICTOZA) 18 MG/3ML SOPN INJECT 1.2 MG UNDER THE SKIN ONCE DAILY 03/19/21  Yes Susy Frizzle, MD  lisinopril-hydrochlorothiazide (ZESTORETIC) 20-25 MG tablet Take 1 tablet by mouth daily. 03/19/21  Yes Susy Frizzle, MD  Omega-3  Fatty Acids (FISH OIL) 1000 MG CAPS Take 1,000 mg by mouth 2 (two) times daily.   Yes [provider]  pantoprazole (PROTONIX) 40 MG tablet Take 1 tablet (40 mg total) by mouth 2 (two) times daily. 03/19/21  Yes Susy Frizzle, MD  pioglitazone (ACTOS) 30 MG tablet TAKE 1 TABLET BY MOUTH EVERY DAY STOP TRADJENTA 03/19/21  Yes Susy Frizzle, MD  predniSONE (DELTASONE) 10 MG tablet Take 4 (40 mg) tablets by mouth daily x 1 week, then reduce to 3 (30 mg) tablets by mouth daily x 1 week, then reduce to 2 (20 mg) tablets by mouth daily and stay on this dose until colonoscopy. 06/20/21  Yes [name], MD  apixaban (ELIQUIS) 5 MG TABS tablet Take 1 tablet (5 mg total) by mouth 2 (two) times daily. 03/19/21   Susy Frizzle, MD  aspirin EC 81 MG tablet Take 1 tablet (81 mg total) by mouth daily. 06/01/15   Skeet Latch, MD  Continuous Blood Gluc Sensor (FREESTYLE LIBRE 2 SENSOR) MISC CHECK BLOOD SUGAR FOUR TIMES DAILY 03/28/21   Susy Frizzle, MD  glycopyrrolate (ROBINUL) 1 MG tablet Take 1 tablet (1 mg total) by mouth 2 (two) times  daily as needed. 06/20/21   [name], MD  loperamide (IMODIUM A-D) 2 MG tablet Take 4 mg by mouth 4 (four) times daily as needed for diarrhea or loose stools.    [provider]  saccharomyces boulardii (FLORASTOR) 250 MG capsule Take 1 capsule (250 mg total) by mouth 2 (two) times daily. 05/22/21 07/03/21  [name], MD  vedolizumab (ENTYVIO) 300 MG injection Inject 300 mg as directed See admin instructions. Every 2 months    [provider]     Family History  Problem Relation Age of Onset  . Colon cancer Maternal Grandmother        in her 33's  . Colon polyps Mother        in her 22's  . Diabetes Mother   . Heart disease Father   . Diabetes Maternal Aunt        Aunts and Uncles  . Esophageal cancer Neg Hx   . Stomach cancer Neg Hx   . Rectal cancer Neg Hx     Social History   Socioeconomic History  .  Marital status: Married    Spouse name: Rain Friedt  . Number of children: 2  . Years of education: Not on file  . Highest education level: Not on file  Occupational History  . Occupation: Truck Education administrator: [employer]  Tobacco Use  . Smoking status: Former    Packs/day: 1.50    Years: 25.00    Pack years: 37.50    Types: Cigarettes    Quit date: 08/12/1996    Years since quitting: 24.8  . Smokeless tobacco: Never  Vaping Use  . Vaping Use: Never used  Substance and Sexual Activity  . Alcohol use: Yes    Alcohol/week: 1.0 standard drink    Types: 1 Cans of beer per week    Comment: social use  . Drug use: No  . Sexual activity: Yes  Other Topics Concern  . Not on file  Social History Narrative   ** Merged History Encounter **       Social Determinants of Health   Financial Resource Strain: Not on file  Food Insecurity: Not on file  Transportation Needs: Not on file  Physical Activity: Not on file  Stress: Not on file  Social Connections: Not on file     Review of Systems: A 12 point ROS discussed and pertinent positives are indicated in the HPI above.  All other systems are negative.  Review of Systems  Constitutional:  Negative for chills, fatigue and fever.  HENT:  Negative for nosebleeds.   Eyes:  Negative for visual disturbance.  Respiratory:  Negative for cough and shortness of breath.   Cardiovascular:  Negative for chest pain and leg swelling.  Gastrointestinal:  Positive for abdominal pain and blood in stool. Negative for nausea and vomiting.       Pt reports he is having an UC flare  Genitourinary:  Negative for hematuria.  Neurological:  Negative for dizziness, light-headedness and headaches.   Vital Signs: BP 114/75   Pulse 66   Temp 98.1 F (36.7 C) (Oral)   Resp 18   Ht 5' 10"  (1.778 m)   Wt 229 lb 15 oz (104.3 kg)   SpO2 97%   BMI 32.99 kg/m   Physical Exam Constitutional:      Appearance: Normal appearance. He is  not ill-appearing.  HENT:     Head: Normocephalic and atraumatic.  Mouth/Throat:     Mouth: Mucous membranes are moist.     Pharynx: Oropharynx is clear.  Eyes:     Pupils: Pupils are equal, round, and reactive to light.  Cardiovascular:     Rate and Rhythm: Normal rate and regular rhythm.     Pulses: Normal pulses.     Heart sounds: Normal heart sounds. No murmur heard.   No gallop.  Pulmonary:     Effort: Pulmonary effort is normal. No respiratory distress.     Breath sounds: Normal breath sounds. No stridor. No wheezing, rhonchi or rales.  Abdominal:     General: Bowel sounds are normal. There is no distension.     Palpations: Abdomen is soft.     Tenderness: There is no abdominal tenderness. There is no guarding.  Musculoskeletal:     Right lower leg: No edema.     Left lower leg: No edema.  Skin:    General: Skin is warm and dry.  Neurological:     Mental Status: He is alert and oriented to person, place, and time.  Psychiatric:        Mood and Affect: Mood normal.        Behavior: Behavior normal.        Thought Content: Thought content normal.        Judgment: Judgment normal.    Imaging: NM LIVER TUMOR LOC IMFLAM SPECT 1 DAY  Result Date: 06/08/2021 CLINICAL DATA:  Hepatocellular carcinoma EXAM: NUCLEAR MEDICINE LIVER SCAN; ULTRASOUND MISCELLANEOUS SOFT TISSUE TECHNIQUE: Abdominal images were obtained in multiple projections after intrahepatic arterial injection of radiopharmaceutical. SPECT imaging was performed. Lung shunt calculation was performed. RADIOPHARMACEUTICALS:  4.22mllicurie MAA TECHNETIUM TO 80M ALBUMIN AGGREGATED COMPARISON:  None. FINDINGS: The injected microaggregated albumin localizes within the liver. Distribution of the injected radiopharmaceutical is within segments 5-8 with preferential deposition of radiotracer within the peripheral right hepatic mass better seen on MRI examination of 03/22/2021. No evidence of activity within the stomach,  duodenum, or bowel. Calculated shunt fraction to the lungs equals 5.1%. IMPRESSION: 1. No significant extrahepatic radiotracer activity following intrahepatic arterial injection of MAA. 2. Lung shunt fraction equals 5.1% Electronically Signed   By: AFidela SalisburyM.D.   On: 06/08/2021 20:06   IR Angiogram Visceral Selective  Result Date: 06/08/2021 INDICATION: 65year old male with 7 cm unifocal right hepatocellular carcinoma in the absence of significant liver disease (Ardine EngPugh A, ALBI Grade 1). He presents for Tc-MAA mapping study. EXAM: 1. Ultrasound-guided access of the right common femoral artery 2. Catheterization and angiography of the common, right hepatic, and right segmental hepatic arteries 3. Cone beam CT 4. MAA injection into the right/left hepatic artery MEDICATIONS: None. ANESTHESIA/SEDATION: Moderate (conscious) sedation was employed during this procedure. A total of Versed 1 mg and Fentanyl 100 mcg was administered intravenously. Moderate Sedation Time: 57 minutes. The patient's level of consciousness and vital signs were monitored continuously by radiology nursing throughout the procedure under my direct supervision. CONTRAST:  253mVISIPAQUE IODIXANOL 320 MG/ML IV SOLN, 2566mISIPAQUE IODIXANOL 320 MG/ML IV SOLN, 80m48mSIPAQUE IODIXANOL 320 MG/ML IV SOLN, 80mL62mIPAQUE IODIXANOL 320 MG/ML IV SOLN FLUOROSCOPY TIME:  Fluoroscopy Time:  minutes  seconds ( mGy). COMPLICATIONS: None immediate. PROCEDURE: Informed consent was obtained from the patient following explanation of the procedure, risks, benefits and alternatives. The patient understands, agrees and consents for the procedure. All questions were addressed. A time out was performed prior to the initiation of the procedure. Maximal barrier  sterile technique utilized including caps, mask, sterile gowns, sterile gloves, large sterile drape, hand hygiene, and Betadine prep. A preliminary ultrasound of the right groin was performed and  demonstrates a patent right common femoral artery. A permanent ultrasound image was recorded. Using a combination of fluoroscopy and ultrasound, an access site was determined. The overlying skin was anesthetized with 1% Lidocaine. Using ultrasound guidance, access into the right common femoral artery was obtained with visualization of needle entry into the vessel using standard micropuncture technique. Limited angiogram of the common femoral artery demonstrates in appropriate site for a closure device given prominent atherosclerotic plaque about the mid common femoral arteries [place] at least 50% stenosis. A 5 French sheath was placed. A C2 catheter was advanced into the celiac artery. Limited celiac angiography demonstrates conventional anatomy. The catheter was then used to select the right hepatic artery and angiography was performed in multiple obliquities, demonstrating a hypervascular mass in the right lobe, consistent with hepatocellular carcinoma. A Progreat Omega microcatheter and Fathom 16 microwire were used to select the segment 7 hepatic artery. Angiography demonstrates supply to the targeted mass. There is no evidence of non-target extra-hepatic branches. Cone beam CT was performed, demonstrating arterial supply to the superior aspect of the mass. The microcatheter was then retracted and redirected to the segment 6 hepatic artery. Cone beam CT was performed, demonstrating arterial supply to the superior aspect of the mass. The catheter was then retracted to the shared origin of the segment 7 and segment 6 branches. Per protocol, MAA was then infused into the right lateral hepatic artery. All wires and catheters were removed. Hemostasis was achieved using manual pressure. There were no immediate complications. Peripheral pulses were unchanged. The patient was transferred to the recovery area in stable condition. IMPRESSION: 1. Visceral angiography demonstrating a hypervascular mass in the right hepatic  lobe, consistent with hepatocellular carcinoma. 2. Tc-MAA infusion into the right lateral hepatic artery. 3. Moderate focal stenosis about the common femoral artery secondary to atherosclerotic plaque. PLAN: IR will follow up shunt fraction calculations and calculate liver volumes in preparation for radioembolization therapy. Ruthann Cancer, MD [facility] Radiology Electronically Signed   By: Ruthann Cancer M.D.   On: 06/08/2021 12:28   IR Angiogram Selective Each Additional Vessel  Result Date: 06/08/2021 INDICATION: 65 year old male with 7 cm unifocal right hepatocellular carcinoma in the absence of significant liver disease Ardine Eng Pugh A, ALBI Grade 1). He presents for Tc-MAA mapping study. EXAM: 1. Ultrasound-guided access of the right common femoral artery 2. Catheterization and angiography of the common, right hepatic, and right segmental hepatic arteries 3. Cone beam CT 4. MAA injection into the right/left hepatic artery MEDICATIONS: None. ANESTHESIA/SEDATION: Moderate (conscious) sedation was employed during this procedure. A total of Versed 1 mg and Fentanyl 100 mcg was administered intravenously. Moderate Sedation Time: 57 minutes. The patient's level of consciousness and vital signs were monitored continuously by radiology nursing throughout the procedure under my direct supervision. CONTRAST:  33m VISIPAQUE IODIXANOL 320 MG/ML IV SOLN, 250mVISIPAQUE IODIXANOL 320 MG/ML IV SOLN, 2547mISIPAQUE IODIXANOL 320 MG/ML IV SOLN, 59m66mSIPAQUE IODIXANOL 320 MG/ML IV SOLN FLUOROSCOPY TIME:  Fluoroscopy Time:  minutes  seconds ( mGy). COMPLICATIONS: None immediate. PROCEDURE: Informed consent was obtained from the patient following explanation of the procedure, risks, benefits and alternatives. The patient understands, agrees and consents for the procedure. All questions were addressed. A time out was performed prior to the initiation of the procedure. Maximal  barrier sterile technique utilized including caps, mask, sterile gowns, sterile gloves, large sterile drape, hand hygiene, and Betadine prep. A preliminary ultrasound of the right groin was performed and demonstrates a patent right common femoral artery. A permanent ultrasound image was recorded. Using a combination of fluoroscopy and ultrasound, an access site was determined. The overlying skin was anesthetized with 1% Lidocaine. Using ultrasound guidance, access into the right common femoral artery was obtained with visualization of needle entry into the vessel using standard micropuncture technique. Limited angiogram of the common femoral artery demonstrates in appropriate site for a closure device given prominent atherosclerotic plaque about the mid common femoral arteries [place] at least 50% stenosis. A 5 French sheath was placed. A C2 catheter was advanced into the celiac artery. Limited celiac angiography demonstrates conventional anatomy. The catheter was then used to select the right hepatic artery and angiography was performed in multiple obliquities, demonstrating a hypervascular mass in the right lobe, consistent with hepatocellular carcinoma. A Progreat Omega microcatheter and Fathom 16 microwire were used to select the segment 7 hepatic artery. Angiography demonstrates supply to the targeted mass. There is no evidence of non-target extra-hepatic branches. Cone beam CT was performed, demonstrating arterial supply to the superior aspect of the mass. The microcatheter was then retracted and redirected to the segment 6 hepatic artery. Cone beam CT was performed, demonstrating arterial supply to the superior aspect of the mass. The catheter was then retracted to the shared origin of the segment 7 and segment 6 branches. Per protocol, MAA was then infused into the right lateral hepatic artery. All wires and catheters were removed. Hemostasis was achieved using manual pressure. There were no immediate  complications. Peripheral pulses were unchanged. The patient was transferred to the recovery area in stable condition. IMPRESSION: 1. Visceral angiography demonstrating a hypervascular mass in the right hepatic lobe, consistent with hepatocellular carcinoma. 2. Tc-MAA infusion into the right lateral hepatic artery. 3. Moderate focal stenosis about the common femoral artery secondary to atherosclerotic plaque. PLAN: IR will follow up shunt fraction calculations and calculate liver volumes in preparation for radioembolization therapy. Ruthann Cancer, MD [facility] Radiology Electronically Signed   By: Ruthann Cancer M.D.   On: 06/08/2021 12:28   IR Angiogram Selective Each Additional Vessel  Result Date: 06/08/2021 INDICATION: 65 year old male with 7 cm unifocal right hepatocellular carcinoma in the absence of significant liver disease Ardine Eng Pugh A, ALBI Grade 1). He presents for Tc-MAA mapping study. EXAM: 1. Ultrasound-guided access of the right common femoral artery 2. Catheterization and angiography of the common, right hepatic, and right segmental hepatic arteries 3. Cone beam CT 4. MAA injection into the right/left hepatic artery MEDICATIONS: None. ANESTHESIA/SEDATION: Moderate (conscious) sedation was employed during this procedure. A total of Versed 1 mg and Fentanyl 100 mcg was administered intravenously. Moderate Sedation Time: 57 minutes. The patient's level of consciousness and vital signs were monitored continuously by radiology nursing throughout the procedure under my direct supervision. CONTRAST:  50m VISIPAQUE IODIXANOL 320 MG/ML IV SOLN, 296mVISIPAQUE IODIXANOL 320 MG/ML IV SOLN, 2551mISIPAQUE IODIXANOL 320 MG/ML IV SOLN, 63m54mSIPAQUE IODIXANOL 320 MG/ML IV SOLN FLUOROSCOPY TIME:  Fluoroscopy Time:  minutes  seconds ( mGy). COMPLICATIONS: None immediate. PROCEDURE: Informed consent was obtained from the patient following explanation of the  procedure, risks, benefits and alternatives. The patient understands, agrees and consents for the procedure. All questions were addressed. A time out was performed prior to the initiation of the procedure.  Maximal barrier sterile technique utilized including caps, mask, sterile gowns, sterile gloves, large sterile drape, hand hygiene, and Betadine prep. A preliminary ultrasound of the right groin was performed and demonstrates a patent right common femoral artery. A permanent ultrasound image was recorded. Using a combination of fluoroscopy and ultrasound, an access site was determined. The overlying skin was anesthetized with 1% Lidocaine. Using ultrasound guidance, access into the right common femoral artery was obtained with visualization of needle entry into the vessel using standard micropuncture technique. Limited angiogram of the common femoral artery demonstrates in appropriate site for a closure device given prominent atherosclerotic plaque about the mid common femoral arteries [place] at least 50% stenosis. A 5 French sheath was placed. A C2 catheter was advanced into the celiac artery. Limited celiac angiography demonstrates conventional anatomy. The catheter was then used to select the right hepatic artery and angiography was performed in multiple obliquities, demonstrating a hypervascular mass in the right lobe, consistent with hepatocellular carcinoma. A Progreat Omega microcatheter and Fathom 16 microwire were used to select the segment 7 hepatic artery. Angiography demonstrates supply to the targeted mass. There is no evidence of non-target extra-hepatic branches. Cone beam CT was performed, demonstrating arterial supply to the superior aspect of the mass. The microcatheter was then retracted and redirected to the segment 6 hepatic artery. Cone beam CT was performed, demonstrating arterial supply to the superior aspect of the mass. The catheter was then retracted to the shared origin of the segment 7  and segment 6 branches. Per protocol, MAA was then infused into the right lateral hepatic artery. All wires and catheters were removed. Hemostasis was achieved using manual pressure. There were no immediate complications. Peripheral pulses were unchanged. The patient was transferred to the recovery area in stable condition. IMPRESSION: 1. Visceral angiography demonstrating a hypervascular mass in the right hepatic lobe, consistent with hepatocellular carcinoma. 2. Tc-MAA infusion into the right lateral hepatic artery. 3. Moderate focal stenosis about the common femoral artery secondary to atherosclerotic plaque. PLAN: IR will follow up shunt fraction calculations and calculate liver volumes in preparation for radioembolization therapy. Ruthann Cancer, MD [facility] Radiology Electronically Signed   By: Ruthann Cancer M.D.   On: 06/08/2021 12:28   IR Angiogram Selective Each Additional Vessel  Result Date: 06/08/2021 INDICATION: 65 year old male with 7 cm unifocal right hepatocellular carcinoma in the absence of significant liver disease Ardine Eng Pugh A, ALBI Grade 1). He presents for Tc-MAA mapping study. EXAM: 1. Ultrasound-guided access of the right common femoral artery 2. Catheterization and angiography of the common, right hepatic, and right segmental hepatic arteries 3. Cone beam CT 4. MAA injection into the right/left hepatic artery MEDICATIONS: None. ANESTHESIA/SEDATION: Moderate (conscious) sedation was employed during this procedure. A total of Versed 1 mg and Fentanyl 100 mcg was administered intravenously. Moderate Sedation Time: 57 minutes. The patient's level of consciousness and vital signs were monitored continuously by radiology nursing throughout the procedure under my direct supervision. CONTRAST:  69m VISIPAQUE IODIXANOL 320 MG/ML IV SOLN, 264mVISIPAQUE IODIXANOL 320 MG/ML IV SOLN, 2560mISIPAQUE IODIXANOL 320 MG/ML IV SOLN, 9m62mSIPAQUE  IODIXANOL 320 MG/ML IV SOLN FLUOROSCOPY TIME:  Fluoroscopy Time:  minutes  seconds ( mGy). COMPLICATIONS: None immediate. PROCEDURE: Informed consent was obtained from the patient following explanation of the procedure, risks, benefits and alternatives. The patient understands, agrees and consents for the procedure. All questions were addressed. A time out was performed prior to the initiation of the  procedure. Maximal barrier sterile technique utilized including caps, mask, sterile gowns, sterile gloves, large sterile drape, hand hygiene, and Betadine prep. A preliminary ultrasound of the right groin was performed and demonstrates a patent right common femoral artery. A permanent ultrasound image was recorded. Using a combination of fluoroscopy and ultrasound, an access site was determined. The overlying skin was anesthetized with 1% Lidocaine. Using ultrasound guidance, access into the right common femoral artery was obtained with visualization of needle entry into the vessel using standard micropuncture technique. Limited angiogram of the common femoral artery demonstrates in appropriate site for a closure device given prominent atherosclerotic plaque about the mid common femoral arteries [place] at least 50% stenosis. A 5 French sheath was placed. A C2 catheter was advanced into the celiac artery. Limited celiac angiography demonstrates conventional anatomy. The catheter was then used to select the right hepatic artery and angiography was performed in multiple obliquities, demonstrating a hypervascular mass in the right lobe, consistent with hepatocellular carcinoma. A Progreat Omega microcatheter and Fathom 16 microwire were used to select the segment 7 hepatic artery. Angiography demonstrates supply to the targeted mass. There is no evidence of non-target extra-hepatic branches. Cone beam CT was performed, demonstrating arterial supply to the superior aspect of the mass. The microcatheter was then retracted and  redirected to the segment 6 hepatic artery. Cone beam CT was performed, demonstrating arterial supply to the superior aspect of the mass. The catheter was then retracted to the shared origin of the segment 7 and segment 6 branches. Per protocol, MAA was then infused into the right lateral hepatic artery. All wires and catheters were removed. Hemostasis was achieved using manual pressure. There were no immediate complications. Peripheral pulses were unchanged. The patient was transferred to the recovery area in stable condition. IMPRESSION: 1. Visceral angiography demonstrating a hypervascular mass in the right hepatic lobe, consistent with hepatocellular carcinoma. 2. Tc-MAA infusion into the right lateral hepatic artery. 3. Moderate focal stenosis about the common femoral artery secondary to atherosclerotic plaque. PLAN: IR will follow up shunt fraction calculations and calculate liver volumes in preparation for radioembolization therapy. Ruthann Cancer, MD [facility] Radiology Electronically Signed   By: Ruthann Cancer M.D.   On: 06/08/2021 12:28   IR Angiogram Selective Each Additional Vessel  Result Date: 06/08/2021 INDICATION: 65 year old male with 7 cm unifocal right hepatocellular carcinoma in the absence of significant liver disease Ardine Eng Pugh A, ALBI Grade 1). He presents for Tc-MAA mapping study. EXAM: 1. Ultrasound-guided access of the right common femoral artery 2. Catheterization and angiography of the common, right hepatic, and right segmental hepatic arteries 3. Cone beam CT 4. MAA injection into the right/left hepatic artery MEDICATIONS: None. ANESTHESIA/SEDATION: Moderate (conscious) sedation was employed during this procedure. A total of Versed 1 mg and Fentanyl 100 mcg was administered intravenously. Moderate Sedation Time: 57 minutes. The patient's level of consciousness and vital signs were monitored continuously by radiology nursing  throughout the procedure under my direct supervision. CONTRAST:  26m VISIPAQUE IODIXANOL 320 MG/ML IV SOLN, 272mVISIPAQUE IODIXANOL 320 MG/ML IV SOLN, 2541mISIPAQUE IODIXANOL 320 MG/ML IV SOLN, 45m62mSIPAQUE IODIXANOL 320 MG/ML IV SOLN FLUOROSCOPY TIME:  Fluoroscopy Time:  minutes  seconds ( mGy). COMPLICATIONS: None immediate. PROCEDURE: Informed consent was obtained from the patient following explanation of the procedure, risks, benefits and alternatives. The patient understands, agrees and consents for the procedure. All questions were addressed. A time out was performed prior to the initiation of  the procedure. Maximal barrier sterile technique utilized including caps, mask, sterile gowns, sterile gloves, large sterile drape, hand hygiene, and Betadine prep. A preliminary ultrasound of the right groin was performed and demonstrates a patent right common femoral artery. A permanent ultrasound image was recorded. Using a combination of fluoroscopy and ultrasound, an access site was determined. The overlying skin was anesthetized with 1% Lidocaine. Using ultrasound guidance, access into the right common femoral artery was obtained with visualization of needle entry into the vessel using standard micropuncture technique. Limited angiogram of the common femoral artery demonstrates in appropriate site for a closure device given prominent atherosclerotic plaque about the mid common femoral arteries [place] at least 50% stenosis. A 5 French sheath was placed. A C2 catheter was advanced into the celiac artery. Limited celiac angiography demonstrates conventional anatomy. The catheter was then used to select the right hepatic artery and angiography was performed in multiple obliquities, demonstrating a hypervascular mass in the right lobe, consistent with hepatocellular carcinoma. A Progreat Omega microcatheter and Fathom 16 microwire were used to select the segment 7 hepatic artery. Angiography demonstrates supply to  the targeted mass. There is no evidence of non-target extra-hepatic branches. Cone beam CT was performed, demonstrating arterial supply to the superior aspect of the mass. The microcatheter was then retracted and redirected to the segment 6 hepatic artery. Cone beam CT was performed, demonstrating arterial supply to the superior aspect of the mass. The catheter was then retracted to the shared origin of the segment 7 and segment 6 branches. Per protocol, MAA was then infused into the right lateral hepatic artery. All wires and catheters were removed. Hemostasis was achieved using manual pressure. There were no immediate complications. Peripheral pulses were unchanged. The patient was transferred to the recovery area in stable condition. IMPRESSION: 1. Visceral angiography demonstrating a hypervascular mass in the right hepatic lobe, consistent with hepatocellular carcinoma. 2. Tc-MAA infusion into the right lateral hepatic artery. 3. Moderate focal stenosis about the common femoral artery secondary to atherosclerotic plaque. PLAN: IR will follow up shunt fraction calculations and calculate liver volumes in preparation for radioembolization therapy. Ruthann Cancer, MD [facility] Radiology Electronically Signed   By: Ruthann Cancer M.D.   On: 06/08/2021 12:28   IR US Guide Vasc Access Right  Result Date: 06/08/2021 INDICATION: 64 year old male with 7 cm unifocal right hepatocellular carcinoma in the absence of significant liver disease Ardine Eng Pugh A, ALBI Grade 1). He presents for Tc-MAA mapping study. EXAM: 1. Ultrasound-guided access of the right common femoral artery 2. Catheterization and angiography of the common, right hepatic, and right segmental hepatic arteries 3. Cone beam CT 4. MAA injection into the right/left hepatic artery MEDICATIONS: None. ANESTHESIA/SEDATION: Moderate (conscious) sedation was employed during this procedure. A total of Versed 1 mg  and Fentanyl 100 mcg was administered intravenously. Moderate Sedation Time: 57 minutes. The patient's level of consciousness and vital signs were monitored continuously by radiology nursing throughout the procedure under my direct supervision. CONTRAST:  69m VISIPAQUE IODIXANOL 320 MG/ML IV SOLN, 241mVISIPAQUE IODIXANOL 320 MG/ML IV SOLN, 2572mISIPAQUE IODIXANOL 320 MG/ML IV SOLN, 28m47mSIPAQUE IODIXANOL 320 MG/ML IV SOLN FLUOROSCOPY TIME:  Fluoroscopy Time:  minutes  seconds ( mGy). COMPLICATIONS: None immediate. PROCEDURE: Informed consent was obtained from the patient following explanation of the procedure, risks, benefits and alternatives. The patient understands, agrees and consents for the procedure. All questions were addressed. A time out was performed prior to the initiation  of the procedure. Maximal barrier sterile technique utilized including caps, mask, sterile gowns, sterile gloves, large sterile drape, hand hygiene, and Betadine prep. A preliminary ultrasound of the right groin was performed and demonstrates a patent right common femoral artery. A permanent ultrasound image was recorded. Using a combination of fluoroscopy and ultrasound, an access site was determined. The overlying skin was anesthetized with 1% Lidocaine. Using ultrasound guidance, access into the right common femoral artery was obtained with visualization of needle entry into the vessel using standard micropuncture technique. Limited angiogram of the common femoral artery demonstrates in appropriate site for a closure device given prominent atherosclerotic plaque about the mid common femoral arteries [place] at least 50% stenosis. A 5 French sheath was placed. A C2 catheter was advanced into the celiac artery. Limited celiac angiography demonstrates conventional anatomy. The catheter was then used to select the right hepatic artery and angiography was performed in multiple obliquities, demonstrating a hypervascular mass in the  right lobe, consistent with hepatocellular carcinoma. A Progreat Omega microcatheter and Fathom 16 microwire were used to select the segment 7 hepatic artery. Angiography demonstrates supply to the targeted mass. There is no evidence of non-target extra-hepatic branches. Cone beam CT was performed, demonstrating arterial supply to the superior aspect of the mass. The microcatheter was then retracted and redirected to the segment 6 hepatic artery. Cone beam CT was performed, demonstrating arterial supply to the superior aspect of the mass. The catheter was then retracted to the shared origin of the segment 7 and segment 6 branches. Per protocol, MAA was then infused into the right lateral hepatic artery. All wires and catheters were removed. Hemostasis was achieved using manual pressure. There were no immediate complications. Peripheral pulses were unchanged. The patient was transferred to the recovery area in stable condition. IMPRESSION: 1. Visceral angiography demonstrating a hypervascular mass in the right hepatic lobe, consistent with hepatocellular carcinoma. 2. Tc-MAA infusion into the right lateral hepatic artery. 3. Moderate focal stenosis about the common femoral artery secondary to atherosclerotic plaque. PLAN: IR will follow up shunt fraction calculations and calculate liver volumes in preparation for radioembolization therapy. Ruthann Cancer, MD [facility] Radiology Electronically Signed   By: Ruthann Cancer M.D.   On: 06/08/2021 12:28   IR EMBO ARTERIAL NOT HEMORR HEMANG INC GUIDE ROADMAPPING  Result Date: 06/08/2021 INDICATION: 65 year old male with 7 cm unifocal right hepatocellular carcinoma in the absence of significant liver disease Ardine Eng Pugh A, ALBI Grade 1). He presents for Tc-MAA mapping study. EXAM: 1. Ultrasound-guided access of the right common femoral artery 2. Catheterization and angiography of the common, right hepatic, and right  segmental hepatic arteries 3. Cone beam CT 4. MAA injection into the right/left hepatic artery MEDICATIONS: None. ANESTHESIA/SEDATION: Moderate (conscious) sedation was employed during this procedure. A total of Versed 1 mg and Fentanyl 100 mcg was administered intravenously. Moderate Sedation Time: 57 minutes. The patient's level of consciousness and vital signs were monitored continuously by radiology nursing throughout the procedure under my direct supervision. CONTRAST:  33m VISIPAQUE IODIXANOL 320 MG/ML IV SOLN, 246mVISIPAQUE IODIXANOL 320 MG/ML IV SOLN, 2561mISIPAQUE IODIXANOL 320 MG/ML IV SOLN, 68m12mSIPAQUE IODIXANOL 320 MG/ML IV SOLN FLUOROSCOPY TIME:  Fluoroscopy Time:  minutes  seconds ( mGy). COMPLICATIONS: None immediate. PROCEDURE: Informed consent was obtained from the patient following explanation of the procedure, risks, benefits and alternatives. The patient understands, agrees and consents for the procedure. All questions were addressed. A time out was performed  prior to the initiation of the procedure. Maximal barrier sterile technique utilized including caps, mask, sterile gowns, sterile gloves, large sterile drape, hand hygiene, and Betadine prep. A preliminary ultrasound of the right groin was performed and demonstrates a patent right common femoral artery. A permanent ultrasound image was recorded. Using a combination of fluoroscopy and ultrasound, an access site was determined. The overlying skin was anesthetized with 1% Lidocaine. Using ultrasound guidance, access into the right common femoral artery was obtained with visualization of needle entry into the vessel using standard micropuncture technique. Limited angiogram of the common femoral artery demonstrates in appropriate site for a closure device given prominent atherosclerotic plaque about the mid common femoral arteries [place] at least 50% stenosis. A 5 French sheath was placed. A C2 catheter was advanced into the celiac artery.  Limited celiac angiography demonstrates conventional anatomy. The catheter was then used to select the right hepatic artery and angiography was performed in multiple obliquities, demonstrating a hypervascular mass in the right lobe, consistent with hepatocellular carcinoma. A Progreat Omega microcatheter and Fathom 16 microwire were used to select the segment 7 hepatic artery. Angiography demonstrates supply to the targeted mass. There is no evidence of non-target extra-hepatic branches. Cone beam CT was performed, demonstrating arterial supply to the superior aspect of the mass. The microcatheter was then retracted and redirected to the segment 6 hepatic artery. Cone beam CT was performed, demonstrating arterial supply to the superior aspect of the mass. The catheter was then retracted to the shared origin of the segment 7 and segment 6 branches. Per protocol, MAA was then infused into the right lateral hepatic artery. All wires and catheters were removed. Hemostasis was achieved using manual pressure. There were no immediate complications. Peripheral pulses were unchanged. The patient was transferred to the recovery area in stable condition. IMPRESSION: 1. Visceral angiography demonstrating a hypervascular mass in the right hepatic lobe, consistent with hepatocellular carcinoma. 2. Tc-MAA infusion into the right lateral hepatic artery. 3. Moderate focal stenosis about the common femoral artery secondary to atherosclerotic plaque. PLAN: IR will follow up shunt fraction calculations and calculate liver volumes in preparation for radioembolization therapy. Ruthann Cancer, MD [facility] Radiology Electronically Signed   By: Ruthann Cancer M.D.   On: 06/08/2021 12:28   NM FUSION  Result Date: 06/08/2021 CLINICAL DATA:  Hepatocellular carcinoma EXAM: NUCLEAR MEDICINE LIVER SCAN; ULTRASOUND MISCELLANEOUS SOFT TISSUE TECHNIQUE: Abdominal images were obtained in multiple  projections after intrahepatic arterial injection of radiopharmaceutical. SPECT imaging was performed. Lung shunt calculation was performed. RADIOPHARMACEUTICALS:  4.27mllicurie MAA TECHNETIUM TO 3M ALBUMIN AGGREGATED COMPARISON:  None. FINDINGS: The injected microaggregated albumin localizes within the liver. Distribution of the injected radiopharmaceutical is within segments 5-8 with preferential deposition of radiotracer within the peripheral right hepatic mass better seen on MRI examination of 03/22/2021. No evidence of activity within the stomach, duodenum, or bowel. Calculated shunt fraction to the lungs equals 5.1%. IMPRESSION: 1. No significant extrahepatic radiotracer activity following intrahepatic arterial injection of MAA. 2. Lung shunt fraction equals 5.1% Electronically Signed   By: AFidela SalisburyM.D.   On: 06/08/2021 20:06    Labs:  CBC: Recent Labs    04/17/21 1401 04/24/21 0610 06/08/21 0800 06/21/21 0736  WBC 5.0 5.2 5.3 7.3  HGB 11.5* 11.6* 11.6* 11.6*  HCT 35.8* 36.7* 36.9* 36.8*  PLT 290.0 278 279 299    COAGS: Recent Labs    04/24/21 0610 06/08/21 0800 06/21/21 0736  INR 1.0 1.1 1.0  BMP: Recent Labs    11/20/20 0830 01/17/21 1454 02/15/21 0606 02/16/21 0055 03/02/21 0807 04/17/21 1401 06/08/21 0800 06/21/21 0736  NA 140   < > 140 136 139 139 139 139  K 4.7   < > 4.2 4.4 4.3 4.1 4.2 4.3  CL 104   < > 106 103 104 107 109 111  CO2 25   < > 27 26 27 23 23  18*  GLUCOSE 148*   < > 194* 173* 174* 118* 143* 118*  BUN 25   < > 26* 23 29* 26* 29* 31*  CALCIUM 9.4   < > 9.1 8.7* 9.5 9.2 8.9 9.4  CREATININE 1.67*   < > 1.88* 1.79* 2.03* 1.98* 2.02* 1.79*  GFRNONAA 42*  --  39* 42*  --   --  36* 42*  GFRAA 49*  --   --   --   --   --   --   --    < > = values in this interval not displayed.    LIVER FUNCTION TESTS: Recent Labs    03/02/21 0807 04/17/21 1401 06/08/21 0800 06/21/21 0736  BILITOT 0.5 0.4 0.6 0.5  AST 24 22 32 24  ALT 28 24 28 26    ALKPHOS  --  57 78 55  PROT 6.8 7.2 7.3 7.6  ALBUMIN  --  4.0 3.7 3.8    TUMOR MARKERS: No results for input(s): AFPTM, CEA, CA199, CHROMGRNA in the last 8760 hours.  Assessment and Plan:  History of AAA, arthritis, CKD, CAD, DM type II, HLD, HTN, paroxysmal A. fib, PAD and hepatocellular carcinoma.  Patient had right liver lesion biopsy on 04/24/2021 and consultation with Dr. Serafina Royals 05/08/2021 to discuss treatment options for hepatocellular carcinoma.  Patient had arterial red mapping study, embolization and Test Y 90 dosing 06/08/2021.  Patient is here today for radioembolization therapy of hepatic lesions.  Pt resting on stretcher. He is A&O, calm and pleasant. He is in NAD. VSS Today's labs: WBC WNL Creat 1.79 LFTs WNL INR WNL Hgb 11.6  Risks and benefits discussed with the patient including, but not limited to bleeding, infection, vascular injury, post procedural pain, nausea, vomiting and fatigue, contrast induced renal failure, liver failure, radiation injury to the bowel, radiation induced cholecystitis, neutropenia and possible need for additional procedures.  All of the patient's questions were answered, patient is agreeable to proceed. Consent signed and in chart.   Thank you for this interesting consult.  I greatly enjoyed meeting Matthew Chen and look forward to participating in their care.  A copy of this report was sent to the requesting provider on this date.  Electronically Signed: Tyson Alias, NP 06/21/2021, 8:56 AM   I spent a total of 30 minutes in face to face in clinical consultation, greater than 50% of which was counseling/coordinating care for image guided transarterial embolization of hepatocellular carcinoma using Y 90.

## 2021-06-21 NOTE — Procedures (Signed)
Interventional Radiology Procedure Note  Procedure:  1) Hepatic angiogram 2) Transarterial radioembolization of right lobe mass  Findings: Please refer to procedural dictation for full description. Split dose segmentectomy of right lobe mass.  6 Fr Angioseal of left CFA.  Complications: None immediate  Estimated Blood Loss: < 5 mL  Recommendations: Strict 4 hour bedrest (flat for 2 hours, head of bed 30 degrees for 2 hours). Advance diet as tolerated. Plan to follow up in IR in 2-4 weeks (no imaging at that time).   Ruthann Cancer, MD Pager: [phone]

## 2021-06-22 NOTE — Telephone Encounter (Signed)
Informed patient to hold Eliquis 2 days prior to his procedure. Patient verbalized understanding.

## 2021-06-23 LAB — AFP TUMOR MARKER: AFP, Serum, Tumor Marker: 2.2 ng/mL (ref 0.0–8.4)

## 2021-06-23 LAB — CALPROTECTIN, FECAL: Calprotectin, Fecal: 167 ug/g — ABNORMAL HIGH (ref 0–120)

## 2021-06-23 LAB — CEA: CEA: 1.9 ng/mL (ref 0.0–4.7)

## 2021-06-26 ENCOUNTER — Encounter: Payer: 59 | Admitting: Gastroenterology

## 2021-06-29 ENCOUNTER — Other Ambulatory Visit: Payer: Self-pay | Admitting: Interventional Radiology

## 2021-06-29 DIAGNOSIS — C220 Liver cell carcinoma: Secondary | ICD-10-CM

## 2021-07-11 ENCOUNTER — Ambulatory Visit (AMBULATORY_SURGERY_CENTER): Payer: HMO | Admitting: Gastroenterology

## 2021-07-11 ENCOUNTER — Other Ambulatory Visit: Payer: Self-pay

## 2021-07-11 ENCOUNTER — Encounter: Payer: Self-pay | Admitting: Gastroenterology

## 2021-07-11 VITALS — BP 105/77 | HR 97 | Temp 95.5°F | Resp 21 | Ht 70.0 in | Wt 230.0 lb

## 2021-07-11 DIAGNOSIS — K5140 Inflammatory polyps of colon without complications: Secondary | ICD-10-CM

## 2021-07-11 DIAGNOSIS — R197 Diarrhea, unspecified: Secondary | ICD-10-CM | POA: Diagnosis not present

## 2021-07-11 DIAGNOSIS — E119 Type 2 diabetes mellitus without complications: Secondary | ICD-10-CM | POA: Diagnosis not present

## 2021-07-11 DIAGNOSIS — Z8679 Personal history of other diseases of the circulatory system: Secondary | ICD-10-CM | POA: Diagnosis not present

## 2021-07-11 DIAGNOSIS — K51919 Ulcerative colitis, unspecified with unspecified complications: Secondary | ICD-10-CM | POA: Diagnosis not present

## 2021-07-11 DIAGNOSIS — D123 Benign neoplasm of transverse colon: Secondary | ICD-10-CM | POA: Diagnosis not present

## 2021-07-11 DIAGNOSIS — I10 Essential (primary) hypertension: Secondary | ICD-10-CM | POA: Diagnosis not present

## 2021-07-11 DIAGNOSIS — K219 Gastro-esophageal reflux disease without esophagitis: Secondary | ICD-10-CM | POA: Diagnosis not present

## 2021-07-11 DIAGNOSIS — K51518 Left sided colitis with other complication: Secondary | ICD-10-CM | POA: Diagnosis not present

## 2021-07-11 MED ORDER — SODIUM CHLORIDE 0.9 % IV SOLN: 500.0000 mL | Freq: Once | INTRAVENOUS | Status: DC

## 2021-07-11 NOTE — Progress Notes (Signed)
Called to room to assist during endoscopic procedure.  Patient ID and intended procedure confirmed with present staff. Received instructions for my participation in the procedure from the performing physician.  

## 2021-07-11 NOTE — Op Note (Signed)
[facility] Patient Name: Matthew Chen Procedure Date: 07/11/2021 10:05 AM MRN: [id number] Endoscopist: [name] , MD Age: 65 Referring MD:  Date of Birth: 11/19/1955 Gender: Male Account #: [id number] Procedure:                Colonoscopy Indications:              Diarrhea Medicines:                Monitored Anesthesia Care Procedure:                Pre-Anesthesia Assessment:                           - Prior to the procedure, a History and Physical                            was performed, and patient medications and                            allergies were reviewed. The patient's tolerance of                            previous anesthesia was also reviewed. The risks                            and benefits of the procedure and the sedation                            options and risks were discussed with the patient.                            All questions were answered, and informed consent                            was obtained. Prior Anticoagulants: The patient has                            taken Eliquis (apixaban), last dose was 2 days                            prior to procedure. ASA Grade Assessment: III - A                            patient with severe systemic disease. After                            reviewing the risks and benefits, the patient was                            deemed in satisfactory condition to undergo the                            procedure.  After obtaining informed consent, the colonoscope                            was passed under direct vision. Throughout the                            procedure, the patient's blood pressure, pulse, and                            oxygen saturations were monitored continuously. The                            Olympus CF-HQ190L (Serial# [id number]) Colonoscope was                            introduced through the anus and advanced to the the                            cecum,  identified by appendiceal orifice and                            ileocecal valve. The ileocecal valve, appendiceal                            orifice, and rectum were photographed. The quality                            of the bowel preparation was good. The colonoscopy                            was performed without difficulty. The patient                            tolerated the procedure well. Scope In: 10:15:25 AM Scope Out: 10:32:15 AM Scope Withdrawal Time: 0 hours 14 minutes 37 seconds  Total Procedure Duration: 0 hours 16 minutes 50 seconds  Findings:                 The perianal and digital rectal examinations were                            normal. Several attempts to enter the TI were                            unsuccessful, query deformity or stenosis.                           A 5 mm polyp was found in the transverse colon. The                            polyp was sessile. The polyp was removed with a                            cold snare. Resection and retrieval  were complete.                           A patchy area of mildly erythematous mucosa, loss                            of vascular pattern and a few pseudopolyps was                            found in the rectum, in the sigmoid colon and in                            the descending colon. Biopsies were taken with a                            cold forceps for histology.                           The exam was otherwise without abnormality on                            direct and retroflexion views. Biopsies were taken                            with a cold forceps for histology Complications:            No immediate complications. Estimated blood loss:                            None. Estimated Blood Loss:     Estimated blood loss: none. Impression:               - One 5 mm polyp in the transverse colon, removed                            with a cold snare. Resected and retrieved.                           -  Erythematous mucosa in the rectum, in the sigmoid                            colon and in the descending colon. Biopsied.                           - The examination was otherwise normal on direct                            and retroflexion views. Biopsied. Recommendation:           - Repeat colonoscopy after studies are complete for                            surveillance based on pathology results.                           -  Resume Eliquis (apixaban) in 2 days at prior                            dose. Refer to managing physician for further                            adjustment of therapy.                           - Patient has a contact number available for                            emergencies. The signs and symptoms of potential                            delayed complications were discussed with the                            patient. Return to normal activities tomorrow.                            Written discharge instructions were provided to the                            patient.                           - Resume previous diet.                           - Continue present medications.                           - Imodium 1-2 po qid prn diarrhea.                           - Await pathology results.                           - Return to GI office in 1 month. [name], MD 07/11/2021 10:49:15 AM This report has been signed electronically.

## 2021-07-11 NOTE — Patient Instructions (Addendum)
Resume Eliquis in 2 days Immodium 1-2 PO 4 x days as needed   YOU HAD AN ENDOSCOPIC PROCEDURE TODAY: Refer to the procedure report and other information in the discharge instructions given to you for any specific questions about what was found during the examination. If this information does not answer your questions, please call [facility] office at [phone] to clarify.   YOU SHOULD EXPECT: Some feelings of bloating in the abdomen. Passage of more gas than usual. Walking can help get rid of the air that was put into your GI tract during the procedure and reduce the bloating. If you had a lower endoscopy (such as a colonoscopy or flexible sigmoidoscopy) you may notice spotting of blood in your stool or on the toilet paper. Some abdominal soreness may be present for a day or two, also.  DIET: Your first meal following the procedure should be a light meal and then it is ok to progress to your normal diet. A half-sandwich or bowl of soup is an example of a good first meal. Heavy or fried foods are harder to digest and may make you feel nauseous or bloated. Drink plenty of fluids but you should avoid alcoholic beverages for 24 hours. If you had a esophageal dilation, please see attached instructions for diet.    ACTIVITY: Your care partner should take you home directly after the procedure. You should plan to take it easy, moving slowly for the rest of the day. You can resume normal activity the day after the procedure however YOU SHOULD NOT DRIVE, use power tools, machinery or perform tasks that involve climbing or major physical exertion for 24 hours (because of the sedation medicines used during the test).   SYMPTOMS TO REPORT IMMEDIATELY: A gastroenterologist can be reached at any hour. Please call [phone]  for any of the following symptoms:  Following lower endoscopy (colonoscopy, flexible sigmoidoscopy) Excessive amounts of blood in the stool  Significant tenderness, worsening of abdominal  pains  Swelling of the abdomen that is new, acute  Fever of 100 or higher  FOLLOW UP:  If any biopsies were taken you will be contacted by phone or by letter within the next 1-3 weeks. Call [phone]  if you have not heard about the biopsies in 3 weeks.  Please also call with any specific questions about appointments or follow up tests.

## 2021-07-11 NOTE — Progress Notes (Signed)
VS completed by DT.  Pt's states no medical or surgical changes since previsit or office visit.  

## 2021-07-11 NOTE — Progress Notes (Signed)
Report to PACU, RN, vss, BBS= Clear.  

## 2021-07-11 NOTE — Progress Notes (Signed)
See 06/20/2021 H&P, no changes.

## 2021-07-12 ENCOUNTER — Telehealth: Payer: Self-pay | Admitting: *Deleted

## 2021-07-12 NOTE — Telephone Encounter (Signed)
Message left

## 2021-07-12 NOTE — Telephone Encounter (Signed)
  Follow up Call-  Call back number 07/11/2021 12/01/2020 06/28/2019  Post procedure Call Back phone  # [phone]  Permission to leave phone message Yes Yes Yes  Some recent data might be hidden     Patient questions:  Do you have a fever, pain , or abdominal swelling? No. Pain Score  0 *  Have you tolerated food without any problems? Yes.    Have you been able to return to your normal activities? Yes.    Do you have any questions about your discharge instructions: Diet   No. Medications  No. Follow up visit  No.  Do you have questions or concerns about your Care? No.  Actions: * If pain score is 4 or above: No action needed, pain <4.  Have you developed a fever since your procedure? no  2.   Have you had an respiratory symptoms (SOB or cough) since your procedure? no  3.   Have you tested positive for COVID 19 since your procedure no  4.   Have you had any family members/close contacts diagnosed with the COVID 19 since your procedure?  no   If yes to any of these questions please route to Joylene John, RN and Joella Prince, RN

## 2021-07-17 DIAGNOSIS — E119 Type 2 diabetes mellitus without complications: Secondary | ICD-10-CM | POA: Diagnosis not present

## 2021-07-17 DIAGNOSIS — H04123 Dry eye syndrome of bilateral lacrimal glands: Secondary | ICD-10-CM | POA: Diagnosis not present

## 2021-07-17 DIAGNOSIS — Z961 Presence of intraocular lens: Secondary | ICD-10-CM | POA: Diagnosis not present

## 2021-07-17 LAB — HM DIABETES EYE EXAM

## 2021-07-23 ENCOUNTER — Other Ambulatory Visit: Payer: Self-pay

## 2021-07-23 DIAGNOSIS — I10 Essential (primary) hypertension: Secondary | ICD-10-CM

## 2021-07-23 DIAGNOSIS — I480 Paroxysmal atrial fibrillation: Secondary | ICD-10-CM

## 2021-07-23 DIAGNOSIS — Z79899 Other long term (current) drug therapy: Secondary | ICD-10-CM

## 2021-07-23 NOTE — Progress Notes (Unsigned)
Cmet, lf end of [place] entered and message sent to patient

## 2021-07-26 ENCOUNTER — Other Ambulatory Visit: Payer: Self-pay

## 2021-07-26 ENCOUNTER — Encounter: Payer: Self-pay | Admitting: *Deleted

## 2021-07-26 ENCOUNTER — Ambulatory Visit
Admission: RE | Admit: 2021-07-26 | Discharge: 2021-07-26 | Disposition: A | Discharge status: Home / Self Care | Payer: HMO | Source: Ambulatory Visit | Attending: Interventional Radiology | Admitting: Interventional Radiology

## 2021-07-26 DIAGNOSIS — Z9889 Other specified postprocedural states: Secondary | ICD-10-CM | POA: Diagnosis not present

## 2021-07-26 DIAGNOSIS — C220 Liver cell carcinoma: Secondary | ICD-10-CM | POA: Diagnosis not present

## 2021-07-26 HISTORY — PX: IR RADIOLOGIST EVAL & MGMT: IMG5224

## 2021-07-26 NOTE — Progress Notes (Signed)
Referring Physician(s): Stark Klein, MD  Reason for follow up: Initial follow up after TARE, telephone virtual visit  History of present illness: 65 year old male with 7 cm unifocal right hepatocellular carcinoma in the absence of significant liver disease Ardine Eng Pugh A, ALBI Grade 1).  Given size, hepatectomy is currently not indicated.  Given location and otherwise healthy liver, he was deemed a candidate for locoregional therapy with radiation segmentectomy.  Split dose TARE delivered via segment 6 and 7 branches to the right hepatic mass without complications.  He reports doing well since the procedure.  He endorses some mild fatigue, but denies nausea, vomiting, change in bowel habits, fevers/chills, and pain.  He had some mild tenderness at the left groin access site for a day after the procedure that has resolved.     Past Medical History:  Diagnosis Date   Adenomatous colon polyp 03/2008   Anginal pain ([place])    Aortic aneurysm, thoracic 09/09/2016   4.4 cm by echo 05/2015   Arthritis    "joints in my fingers ache" (07/13/2015)   Bicuspid aortic valve 09/09/2016   Cataract 2019   bilateral eyes   CKD (chronic kidney disease), stage III ([place])    Coronary artery disease    Diabetes mellitus without complication ([place])    Hemorrhoids    Hepatocellular carcinoma (H[facility])    Hyperlipidemia    Hypertension    Paroxysmal atrial fibrillation ([place])    Peripheral arterial disease ([place])    a. dopppler 05/29/2015 revealed high-grade right popliteal and distal left SFA disease b. LE angio 06/28/2015 revealing high grade calcific dx with distal L SFA and R popliteal artery s/p diamondback orbital rotational atherectomy, PTA of L SFA  c. 07/13/2015 diamondback orbital rotational atherectomy and drug eluting balloon angioplasty of R popliteal artery (P2 segment) using distal protection    Type II diabetes mellitus ([place])    Ulcerative colitis     Past Surgical History:  Procedure  Laterality Date   ABDOMINAL AORTOGRAM W/LOWER EXTREMITY N/A 04/27/2018   Procedure: ABDOMINAL AORTOGRAM W/LOWER EXTREMITY;  Surgeon: Lorretta Harp, MD;  Location: [facility];  Service: Cardiovascular;  Laterality: N/A;   COLONOSCOPY     COLONOSCOPY W/ POLYPECTOMY  X 4-5   CORONARY ATHERECTOMY N/A 02/15/2021   Procedure: CORONARY ATHERECTOMY;  Surgeon: Lorretta Harp, MD;  Location: [facility];  Service: Cardiovascular;  Laterality: N/A;  aborted    CORONARY BALLOON ANGIOPLASTY N/A 02/15/2021   Procedure: CORONARY BALLOON ANGIOPLASTY;  Surgeon: Lorretta Harp, MD;  Location: [facility];  Service: Cardiovascular;  Laterality: N/A;   COSMETIC SURGERY  < 2000   "removed birthmark from top of my head"   IR ANGIOGRAM SELECTIVE EACH ADDITIONAL VESSEL  06/08/2021   IR ANGIOGRAM SELECTIVE EACH ADDITIONAL VESSEL  06/08/2021   IR ANGIOGRAM SELECTIVE EACH ADDITIONAL VESSEL  06/08/2021   IR ANGIOGRAM SELECTIVE EACH ADDITIONAL VESSEL  06/08/2021   IR ANGIOGRAM SELECTIVE EACH ADDITIONAL VESSEL  06/21/2021   IR ANGIOGRAM SELECTIVE EACH ADDITIONAL VESSEL  06/21/2021   IR ANGIOGRAM SELECTIVE EACH ADDITIONAL VESSEL  06/21/2021   IR ANGIOGRAM SELECTIVE EACH ADDITIONAL VESSEL  06/21/2021   IR ANGIOGRAM VISCERAL SELECTIVE  06/08/2021   IR ANGIOGRAM VISCERAL SELECTIVE  06/21/2021   IR EMBO ARTERIAL NOT HEMORR HEMANG INC GUIDE ROADMAPPING  06/08/2021   IR EMBO TUMOR ORGAN ISCHEMIA INFARCT INC GUIDE ROADMAPPING  06/21/2021   IR EMBO TUMOR ORGAN ISCHEMIA INFARCT INC GUIDE ROADMAPPING  06/21/2021   IR RADIOLOGIST EVAL & MGMT  05/08/2021   IR US GUIDE VASC ACCESS LEFT  06/21/2021   IR US GUIDE VASC ACCESS RIGHT  06/08/2021   LEFT HEART CATH AND CORONARY ANGIOGRAPHY N/A 02/08/2021   Procedure: LEFT HEART CATH AND CORONARY ANGIOGRAPHY;  Surgeon: Lorretta Harp, MD;  Location: [facility];  Service: Cardiovascular;  Laterality: N/A;   PERIPHERAL VASCULAR ATHERECTOMY   04/27/2018   Procedure: PERIPHERAL VASCULAR ATHERECTOMY;  Surgeon: Lorretta Harp, MD;  Location: [facility];  Service: Cardiovascular;;  right popliteal artery   PERIPHERAL VASCULAR CATHETERIZATION N/A 06/29/2015   Procedure: Lower Extremity Angiography;  Surgeon: Lorretta Harp, MD;  Location: [facility];  Service: Cardiovascular;  Laterality: N/A;   PERIPHERAL VASCULAR CATHETERIZATION N/A 07/13/2015   Procedure: Lower Extremity Angiography;  Surgeon: Lorretta Harp, MD;  Location: [facility];  Service: Cardiovascular;  Laterality: N/A;   PERIPHERAL VASCULAR CATHETERIZATION  07/13/2015   Procedure: Peripheral Vascular Atherectomy;  Surgeon: Lorretta Harp, MD;  Location: [facility];  Service: Cardiovascular;;   PERIPHERAL VASCULAR INTERVENTION  04/27/2018   Procedure: PERIPHERAL VASCULAR INTERVENTION;  Surgeon: Lorretta Harp, MD;  Location: [facility];  Service: Cardiovascular;;  right popliteal artery   POLYPECTOMY     TONSILLECTOMY     UPPER GASTROINTESTINAL ENDOSCOPY      Allergies: Penicillins  Medications: Prior to Admission medications   Medication Sig Start Date End Date Taking? Authorizing Provider  acetaminophen (TYLENOL) 500 MG tablet Take 1,000 mg by mouth every 6 (six) hours as needed for moderate pain or headache.    [provider]  amLODipine (NORVASC) 5 MG tablet Take 1 tablet (5 mg total) by mouth daily. 03/19/21   Susy Frizzle, MD  apixaban (ELIQUIS) 5 MG TABS tablet Take 1 tablet (5 mg total) by mouth 2 (two) times daily. 03/19/21   Susy Frizzle, MD  aspirin EC 81 MG tablet Take 1 tablet (81 mg total) by mouth daily. 06/01/15   Skeet Latch, MD  atorvastatin (LIPITOR) 80 MG tablet Take 1 tablet (80 mg total) by mouth daily. 02/18/21   Kathyrn Drown D, NP  B-D UF III MINI PEN NEEDLES 31G X 5 MM MISC USE DAILY WITH VICTOZA 06/20/21   Susy Frizzle, MD  carvedilol (COREG) 12.5 MG tablet Take 3  tablets (37.5 mg total) by mouth 2 (two) times daily. 03/19/21   Susy Frizzle, MD  Continuous Blood Gluc Sensor (FREESTYLE LIBRE 2 SENSOR) MISC CHECK BLOOD SUGAR FOUR TIMES DAILY 03/28/21   Susy Frizzle, MD  empagliflozin (JARDIANCE) 25 MG TABS tablet Take 1 tablet (25 mg total) by mouth daily before breakfast. 03/19/21   Susy Frizzle, MD  fenofibrate 160 MG tablet Take 1 tablet (160 mg total) by mouth daily. 03/19/21   Susy Frizzle, MD  glipiZIDE (GLUCOTROL XL) 5 MG 24 hr tablet TAKE 1 TABLET BY MOUTH EVERY DAY WITH BREAKFAST 03/28/21   Susy Frizzle, MD  glucose blood (ONETOUCH ULTRA) test strip CHECK BLOOD SUGAR TWICE A DAY 03/19/21   Susy Frizzle, MD  glycopyrrolate (ROBINUL) 1 MG tablet Take 1 tablet (1 mg total) by mouth 2 (two) times daily as needed. 06/20/21   [name], MD  liraglutide (VICTOZA) 18 MG/3ML SOPN INJECT 1.2 MG UNDER THE SKIN ONCE DAILY 03/19/21   Susy Frizzle, MD  lisinopril-hydrochlorothiazide (ZESTORETIC) 20-25 MG tablet Take 1 tablet  by mouth daily. 03/19/21   Susy Frizzle, MD  loperamide (IMODIUM A-D) 2 MG tablet Take 4 mg by mouth 4 (four) times daily as needed for diarrhea or loose stools.    [provider]  Omega-3 Fatty Acids (FISH OIL) 1000 MG CAPS Take 1,000 mg by mouth 2 (two) times daily.    [provider]  ondansetron (ZOFRAN ODT) 4 MG disintegrating tablet Take 1 tablet (4 mg total) by mouth every 8 (eight) hours as needed for nausea or vomiting. 06/21/21   Tyson Alias, NP  pantoprazole (PROTONIX) 40 MG tablet Take 1 tablet (40 mg total) by mouth 2 (two) times daily. 03/19/21   Susy Frizzle, MD  pioglitazone (ACTOS) 30 MG tablet TAKE 1 TABLET BY MOUTH EVERY DAY STOP TRADJENTA 03/19/21   Susy Frizzle, MD  predniSONE (DELTASONE) 10 MG tablet Take 4 (40 mg) tablets by mouth daily x 1 week, then reduce to 3 (30 mg) tablets by mouth daily x 1 week, then reduce to 2 (20 mg) tablets by mouth daily and stay on this  dose until colonoscopy. Patient not taking: Reported on 07/11/2021 06/20/21   [name], MD  vedolizumab (ENTYVIO) 300 MG injection Inject 300 mg as directed See admin instructions. 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[employer], academic  Tobacco Use   Smoking status: Former    Packs/day: 1.50    Years: 25.00    Pack years: 37.50    Types: Cigarettes    Quit date: 08/12/1996    Years since quitting: 24.9   Smokeless tobacco: Never  Vaping Use   Vaping Use: Never used  Substance and Sexual Activity   Alcohol use: Yes    Alcohol/week: 1.0 standard drink    Types: 1 Cans of beer per week    Comment: social use   Drug use: No   Sexual activity: Yes  Other Topics Concern   Not on file  Social History Narrative   ** Merged History Encounter **       Social Determinants of Health   Financial Resource Strain: Not on file  Food Insecurity: Not on file  Transportation Needs: Not on file  Physical Activity: Not on file  Stress: Not on file  Social Connections: Not on file     Vital Signs: There were no vitals taken for this visit.  No physical examination performed in lieu of telephone clinic visit.  Imaging: No new pertinent imaging.  Labs: No new pertinent labs.   Assessment and Plan: 65 year old male without significant underlying liver disease (Childs A, ALBI Grade 1)  with  biopsy proven 7 cm right hepatocellular carcinoma status post transarterial radioembolization with split dose segmentectomy on 06/21/21.  He is recovering very well after the procedure with only minimal fatigue.    Follow up in 2 months with MRI abdomen.  Electronically Signed: Suzette Battiest 07/26/2021, 7:46 AM   I spent a total of 25 Minutes in telephone clinical consultation, greater than 50% of which was counseling/coordinating care for hepatocellular carcinoma.

## 2021-07-31 ENCOUNTER — Other Ambulatory Visit: Payer: Self-pay

## 2021-07-31 ENCOUNTER — Telehealth: Payer: Self-pay | Admitting: Family Medicine

## 2021-07-31 DIAGNOSIS — K5190 Ulcerative colitis, unspecified, without complications: Secondary | ICD-10-CM | POA: Diagnosis not present

## 2021-07-31 DIAGNOSIS — E785 Hyperlipidemia, unspecified: Secondary | ICD-10-CM

## 2021-07-31 DIAGNOSIS — I10 Essential (primary) hypertension: Secondary | ICD-10-CM

## 2021-07-31 MED ORDER — CARVEDILOL 12.5 MG PO TABS: 37.5000 mg | ORAL_TABLET | Freq: Two times a day (BID) | ORAL | 5 refills | Status: DC

## 2021-07-31 NOTE — Telephone Encounter (Signed)
Patient came to office to drop off application for permanent disability parking placard. Forms placed in hanging folder beside nurse's desk.   Please advise patient when form completed and ready for pick up at [phone].

## 2021-08-01 ENCOUNTER — Encounter: Payer: Self-pay | Admitting: Gastroenterology

## 2021-08-02 ENCOUNTER — Other Ambulatory Visit: Payer: Self-pay

## 2021-08-02 ENCOUNTER — Telehealth: Payer: Self-pay | Admitting: Family Medicine

## 2021-08-02 DIAGNOSIS — I10 Essential (primary) hypertension: Secondary | ICD-10-CM

## 2021-08-02 DIAGNOSIS — E118 Type 2 diabetes mellitus with unspecified complications: Secondary | ICD-10-CM

## 2021-08-02 DIAGNOSIS — I480 Paroxysmal atrial fibrillation: Secondary | ICD-10-CM

## 2021-08-02 DIAGNOSIS — E7800 Pure hypercholesterolemia, unspecified: Secondary | ICD-10-CM

## 2021-08-02 NOTE — Progress Notes (Signed)
°  Chronic Care Management   Note  08/02/2021 Name: Matthew Chen MRN: [id number] DOB: 04-23-1956  Matthew Chen is a 65 y.o. year old male who is a primary care patient of Susy Frizzle, MD. I reached out to [name] by phone today in response to a referral sent by Matthew Chen's PCP, Susy Frizzle, MD.   [name] was given information about Chronic Care Management services today including:  CCM service includes personalized support from designated clinical staff supervised by his physician, including individualized plan of care and coordination with other care providers 24/7 contact phone numbers for assistance for urgent and routine care needs. Service will only be billed when office clinical staff spend 20 minutes or more in a month to coordinate care. Only one practitioner may furnish and bill the service in a calendar month. The patient may stop CCM services at any time (effective at the end of the month) by phone call to the office staff.   Patient agreed to services and verbal consent obtained.   Follow up plan:   Tatjana Secretary/administrator

## 2021-08-02 NOTE — Progress Notes (Deleted)
Chronic Care Management Pharmacy Note  08/02/2021 Name:  Matthew Chen MRN:  [id number] DOB:  11/27/1955  Summary: ***  Recommendations/Changes made from today's visit: ***  Plan: ***   Subjective: Matthew Chen is an 65 y.o. year old male who is a primary patient of Pickard, Cammie Mcgee, MD.  The CCM team was consulted for assistance with disease management and care coordination needs.    Engaged with patient by telephone for initial visit in response to provider referral for pharmacy case management and/or care coordination services.   Consent to Services:  The patient was given the following information about Chronic Care Management services today, agreed to services, and gave verbal consent: 1. CCM service includes personalized support from designated clinical staff supervised by the primary care provider, including individualized plan of care and coordination with other care providers 2. 24/7 contact phone numbers for assistance for urgent and routine care needs. 3. Service will only be billed when office clinical staff spend 20 minutes or more in a month to coordinate care. 4. Only one practitioner may furnish and bill the service in a calendar month. 5.The patient may stop CCM services at any time (effective at the end of the month) by phone call to the office staff. 6. The patient will be responsible for cost sharing (co-pay) of up to 20% of the service fee (after annual deductible is met). Patient agreed to services and consent obtained.  Patient Care Team: Susy Frizzle, MD as PCP - General (Family Medicine) Lorretta Harp, MD as PCP - Cardiology (Cardiology) Susy Frizzle, MD (Family Medicine) Skeet Latch, MD as Attending Physician (Cardiology) Lorretta Harp, MD as Consulting Physician (Cardiology) Edythe Clarity, [facility] as Pharmacist (Pharmacist)  Recent office visits:  None   Recent consult visits:  06/20/2021 OV [name]) [name], MD;  Begin  prednisone 40 mg qd with a 10 mg/week taper to 20 mg qd and then maintain.  Begin glycopyrrolate 1 mg p.o. twice daily as needed abdominal pain, abdominal cramping.   06/04/2021 OV (Cardiology) [name], [name], PA; Atrial Fib office visit, no medication changes indicated.   05/21/2021 OV (Oncology) [name], MD; no medication changes indicated.   05/17/2021 OV (Cardiology) [name], [name]; BP soft. 102/60 in the right arm and 98/54 in the left arm. Patient asymptomatic with this. He states BP is normally higher at home but has been softer lately. - Current medications: Amlodipine 68m daily, Coreg 37.575mtwice daily, Lisinopril-HCTZ 20-2561maily. Discussed stopping the HCTZ portion of combo medications. However, patient preferred to not make any changes. Given he is asymptomatic this is OK.    05/09/2021 OV (GaGertie FeytaLadene ArtistD; no medication changes indicated.   05/04/2021 OV (General Surgery) [name]; no medication changes indicated.   04/02/2021 OV (Cardiology) AllThompson GrayerD; He is on both ASA and eliquis.  Stop ASA when ok with Dr BerGwenlyn Found 02/28/2021 OV (Cardiology) [name]; Stop Plavix, [facility]:  02/08/2021 Admission (CATHLAB) [name];  Clinical Evaluation Leading to the Procedure:   Anginal Classification: CCS II   Anti-ischemic medical therapy: Minimal Therapy (1 class of medications)   Non-Invasive Test Results: Low-risk stress test findings: cardiac mortality <1%/year   Prior CABG: No previous CABG   LEFT HEART CATH AND CORONARY ANGIOGRAPHY (N/A) as a surgical intervention  Objective:  Lab Results  Component Value Date   CREATININE  1.79 (H) 06/21/2021   BUN 31 (H) 06/21/2021   GFR 34.75 (L) 04/17/2021   GFRNONAA 42 (L) 06/21/2021   GFRAA 49 (L) 11/20/2020   NA 139 06/21/2021   K 4.3 06/21/2021   CALCIUM 9.4 06/21/2021   CO2 18 (L) 06/21/2021   GLUCOSE 118 (H)  06/21/2021    Lab Results  Component Value Date/Time   HGBA1C 7.8 (H) 03/02/2021 08:07 AM   HGBA1C 6.8 (H) 11/20/2020 08:30 AM   GFR 34.75 (L) 04/17/2021 02:01 PM   GFR 57.28 (L) 04/30/2018 03:24 PM   MICROALBUR 0.2 11/20/2020 08:30 AM   MICROALBUR 0.2 04/25/2020 12:05 PM    Last diabetic Eye exam:  Lab Results  Component Value Date/Time   HMDIABEYEEXA No Retinopathy 06/26/2020 10:54 AM    Last diabetic Foot exam: No results found for: HMDIABFOOTEX   Lab Results  Component Value Date   CHOL 133 03/02/2021   HDL 30 (L) 03/02/2021   LDLCALC 68 03/02/2021   TRIG 266 (H) 03/02/2021   CHOLHDL 4.4 03/02/2021    Hepatic Function Latest Ref Rng & Units 06/21/2021 06/08/2021 04/17/2021  Total Protein 6.5 - 8.1 g/dL 7.6 7.3 7.2  Albumin 3.5 - 5.0 g/dL 3.8 3.7 4.0  AST 15 - 41 U/L 24 32 22  ALT 0 - 44 U/L _0 Alk Phosphatase 38 - 126 U/L 55 78 57  Total Bilirubin 0.3 - 1.2 mg/dL 0.5 0.6 0.4  Bilirubin, Direct 0.0 - 0.3 mg/dL - - -    Lab Results  Component Value Date/Time   TSH 1.42 04/17/2021 02:01 PM   TSH 1.25 02/02/2018 09:53 AM    CBC Latest Ref Rng & Units 06/21/2021 06/08/2021 04/24/2021  WBC 4.0 - 10.5 K/uL 7.3 5.3 5.2  Hemoglobin 13.0 - 17.0 g/dL 11.6(L) 11.6(L) 11.6(L)  Hematocrit 39.0 - 52.0 % 36.8(L) 36.9(L) 36.7(L)  Platelets 150 - 400 K/uL 299 279 278    No results found for: VD25OH  Clinical ASCVD: {YES/NO:21197} The 10-year ASCVD risk score (Arnett DK, et al., 2019) is: 19.6%   Values used to calculate the score:     Age: 69 years     Sex: Male     Is Non-Hispanic African American: No     Diabetic: Yes     Tobacco smoker: No     Systolic Blood Pressure: 270 mmHg     Is BP treated: Yes     HDL Cholesterol: 30 mg/dL     Total Cholesterol: 133 mg/dL    Depression screen [facility] 2/9 11/20/2020 04/25/2020 05/03/2019  Decreased Interest 0 0 0  Down, Depressed, Hopeless 0 0 0  PHQ - 2 Score 0 0 0  Altered sleeping - - -  Tired, decreased energy - - -   Change in appetite - - -  Feeling bad or failure about yourself  - - -  Trouble concentrating - - -  Moving slowly or fidgety/restless - - -  Suicidal thoughts - - -  PHQ-9 Score - - -  Difficult doing work/chores - - -  Some recent data might be hidden     ***Other: (CHADS2VASc if Afib, MMRC or CAT for COPD, ACT, DEXA)  Social History   Tobacco Use  Smoking Status Former   Packs/day: 1.50   Years: 25.00   Pack years: 37.50   Types: Cigarettes   Quit date: 08/12/1996   Years since quitting: 24.9  Smokeless Tobacco Never   BP Readings from Last 3 Encounters:  07/11/21 105/77  06/21/21 114/66  06/20/21 118/68   Pulse Readings from Last 3 Encounters:  07/11/21 97  06/21/21 81  06/20/21 (!) 55   Wt Readings from Last 3 Encounters:  07/11/21 230 lb (104.3 kg)  06/21/21 229 lb 15 oz (104.3 kg)  06/20/21 230 lb (104.3 kg)   BMI Readings from Last 3 Encounters:  07/11/21 33.00 kg/m  06/21/21 32.99 kg/m  06/20/21 33.00 kg/m    Assessment/Interventions: Review of patient past medical history, allergies, medications, health status, including review of consultants reports, laboratory and other test data, was performed as part of comprehensive evaluation and provision of chronic care management services.   SDOH:  (Social Determinants of Health) assessments and interventions performed: {yes/no:20286}  SDOH Screenings   Alcohol Screen: Low Risk    Last Alcohol Screening Score (AUDIT): 1  Depression (PHQ2-9): Low Risk    PHQ-2 Score: 0  Financial Resource Strain: Not on file  Food Insecurity: Not on file  Housing: Not on file  Physical Activity: Not on file  Social Connections: Not on file  Stress: Not on file  Tobacco Use: Medium Risk   Smoking Tobacco Use: Former   Smokeless Tobacco Use: Never   Passive Exposure: Not on file  Transportation Needs: Not on file    CCM Care Plan  Allergies  Allergen Reactions   Penicillins Other (See Comments)    Unsure,  childhood reaction Has patient had a PCN reaction causing immediate rash, facial/tongue/throat swelling, SOB or lightheadedness with hypotension: Unknown Has patient had a PCN reaction causing severe rash involving mucus membranes or skin necrosis: Unknown Has patient had a PCN reaction that required hospitalization: No Has patient had a PCN reaction occurring within the last 10 years: No If all of the above answers are "NO", then may proceed with Cephalosporin use.     Medications Reviewed Today     Reviewed by [name], MD (Physician) on 07/11/21 at 1004  Med List Status: <None>   Medication Order Taking? Sig Documenting Provider Last Dose Status Informant  0.9 %  sodium chloride infusion [id number]   [name], MD  Active   acetaminophen (TYLENOL) 500 MG tablet [id number] No Take 1,000 mg by mouth every 6 (six) hours as needed for moderate pain or headache. [provider] Unknown Active Self  amLODipine (NORVASC) 5 MG tablet [id number] Yes Take 1 tablet (5 mg total) by mouth daily. Susy Frizzle, MD 07/10/2021 Active Self  apixaban (ELIQUIS) 5 MG TABS tablet [id number] No Take 1 tablet (5 mg total) by mouth 2 (two) times daily. Susy Frizzle, MD 07/08/2021 Active Self  aspirin EC 81 MG tablet [id number] No Take 1 tablet (81 mg total) by mouth daily. Skeet Latch, MD 07/08/2021 Active Self  atorvastatin (LIPITOR) 80 MG tablet [id number] Yes Take 1 tablet (80 mg total) by mouth daily. Kathyrn Drown D, NP 07/10/2021 Active Self  B-D UF III MINI PEN NEEDLES 31G X 5 MM MISC [id number] Yes USE DAILY WITH Mel Almond, MD 07/10/2021 Active   carvedilol (COREG) 12.5 MG tablet [id number] Yes Take 3 tablets (37.5 mg total) by mouth 2 (two) times daily. Susy Frizzle, MD 07/10/2021 Active Self  Continuous Blood Gluc Sensor (FREESTYLE LIBRE 2 SENSOR) [place] [id number] Yes CHECK BLOOD SUGAR FOUR TIMES DAILY Susy Frizzle, MD 07/10/2021 Active Self   empagliflozin (JARDIANCE) 25 MG TABS tablet [id number] Yes Take 1 tablet (25 mg total) by mouth daily before breakfast. Susy Frizzle, MD  07/10/2021 Active Self  fenofibrate 160 MG tablet [id number] Yes Take 1 tablet (160 mg total) by mouth daily. Susy Frizzle, MD 07/10/2021 Active Self  glipiZIDE (GLUCOTROL XL) 5 MG 24 hr tablet [id number] Yes TAKE 1 TABLET BY MOUTH EVERY DAY WITH BREAKFAST Susy Frizzle, MD 07/10/2021 Active Self  glucose blood (ONETOUCH ULTRA) test strip [id number] Yes CHECK BLOOD SUGAR TWICE A DAY Susy Frizzle, MD 07/10/2021 Active Self  glycopyrrolate (ROBINUL) 1 MG tablet [id number] Yes Take 1 tablet (1 mg total) by mouth 2 (two) times daily as needed. [name], MD 07/10/2021 Active   liraglutide (VICTOZA) 18 MG/3ML SOPN [id number] Yes INJECT 1.2 MG UNDER THE SKIN ONCE DAILY Susy Frizzle, MD 07/10/2021 Active Self  lisinopril-hydrochlorothiazide (ZESTORETIC) 20-25 MG tablet [id number] Yes Take 1 tablet by mouth daily. Susy Frizzle, MD 07/10/2021 Active Self  loperamide (IMODIUM A-D) 2 MG tablet [id number] No Take 4 mg by mouth 4 (four) times daily as needed for diarrhea or loose stools. [provider] Unknown Active Self  Omega-3 Fatty Acids (FISH OIL) 1000 MG CAPS [id number] Yes Take 1,000 mg by mouth 2 (two) times daily. [provider] 07/10/2021 Active Self  ondansetron (ZOFRAN ODT) 4 MG disintegrating tablet [id number] Yes Take 1 tablet (4 mg total) by mouth every 8 (eight) hours as needed for nausea or vomiting. Tyson Alias, NP Past Week Active   pantoprazole (PROTONIX) 40 MG tablet [id number] Yes Take 1 tablet (40 mg total) by mouth 2 (two) times daily. Susy Frizzle, MD 07/10/2021 Active Self  pioglitazone (ACTOS) 30 MG tablet [id number] Yes TAKE 1 TABLET BY MOUTH EVERY DAY STOP TRADJENTA Susy Frizzle, MD 07/10/2021 Active Self  predniSONE (DELTASONE) 10 MG tablet [id number] No Take 4 (40 mg) tablets by mouth daily x 1  week, then reduce to 3 (30 mg) tablets by mouth daily x 1 week, then reduce to 2 (20 mg) tablets by mouth daily and stay on this dose until colonoscopy.  Patient not taking: Reported on 07/11/2021   [name], MD Not Taking Active   vedolizumab (ENTYVIO) 300 MG injection [id number] No Inject 300 mg as directed See admin instructions. Every 2 months [provider] 06/08/2021 Active Self            Patient Active Problem List   Diagnosis Date Noted   Secondary hypercoagulable state ([place]) 06/04/2021   CKD (chronic kidney disease), stage III ([place]) 05/03/2021   Hepatocellular carcinoma ([place]) 05/03/2021   Paroxysmal atrial fibrillation ([place]) 02/28/2021   Erectile dysfunction 02/17/2021   Coronary artery disease 02/15/2021   CAD (coronary artery disease) 02/15/2021   Atherosclerotic heart disease of native coronary artery without angina pectoris 02/15/2021   Claudication in peripheral vascular disease ([place]) 04/27/2018   Clotting disorder ([place])    Arthritis    Bicuspid aortic valve 09/09/2016   Aortic aneurysm, thoracic 09/09/2016   Hyperlipidemia    Type 2 diabetes mellitus with complication, with long-term current use of insulin ([place])    Claudication ([place]) 06/29/2015   Peripheral arterial disease ([place]) 06/28/2015   Hypertension 10/05/2014   Ulcerative colitis ([place]) 07/04/2008   PERSONAL HX COLONIC POLYPS 07/04/2008   Adenomatous colon polyp 03/12/2008   ULCERATIVE PROCTITIS 03/08/2008   ULCERATIVE PROCTOSIGMOIDITIS 03/08/2008    Immunization History  Administered Date(s) Administered   Influenza Inj Mdck Quad Pf 06/14/2017   Influenza, High Dose Seasonal PF 05/22/2021   Influenza,inj,Quad PF,6+ Mos 07/22/2013, 06/12/2015, 06/10/2016, 04/21/2018, 05/03/2019   Influenza-Unspecified 06/14/2017,  04/21/2018   Pneumococcal Polysaccharide-23 06/23/2017    Conditions to be addressed/monitored:  HTN, CAD, Afib, Type II DM, HLD  There are no care plans that you  recently modified to display for this patient.    Medication Assistance: {MEDASSISTANCEINFO:25044}  Compliance/Adherence/Medication fill history: Care Gaps: ***  Star-Rating Drugs: ***  Patient's preferred pharmacy is:  [id number] [place], [place] - [id number]: [phone]: [phone] [id number] [address] [place]: [phone]: [phone] pill box? {Yes or If no, why not?:20788} Pt endorses ***% compliance  We discussed: {Pharmacy options:24294} Patient decided to: {US Pharmacy Plan:23885}  Care Plan and Follow Up Patient Decision:  {FOLLOWUP:24991}  Plan: {CM FOLLOW UP PLZYYQ:82500}***  Current Barriers:  {pharmacybarriers:24917}  Pharmacist Clinical Goal(s):  Patient will {PHARMACYGOALCHOICES:24921} through collaboration with PharmD and provider.   Interventions: 1:1 collaboration with PiSusy FrizzleMD regarding development and update of comprehensive plan of care as evidenced by provider attestation and co-signature Inter-disciplinary care team collaboration (see longitudinal plan of care) Comprehensive medication review performed; medication list updated in electronic medical record  Hypertension (BP goal {CHL HP UPSTREAM Pharmacist BP ranges:[phone]}) -{US controlled/uncontrolled:25276} -Current treatment: *** -Medications previously tried: ***  -Current home readings: *** -Current dietary habits: *** -Current exercise habits: *** -{ACTIONS;DENIES/REPORTS:21021675::"Denies"} hypotensive/hypertensive symptoms -Educated on {CCM BP Counseling:25124} -Counseled to monitor BP at home ***, document, and provide log at future appointments -{CCMPHARMDINTERVENTION:25122}  Hyperlipidemia: (LDL goal < ***) -{US  controlled/uncontrolled:25276} -Current treatment: *** -Medications previously tried: ***  -Current dietary patterns: *** -Current exercise habits: *** -Educated on {CCM HLD Counseling:25126} -{CCMPHARMDINTERVENTION:25122}  Diabetes (A1c goal {A1c goals:23924}) -{US controlled/uncontrolled:25276} -Current medications: *** -Medications previously tried: ***  -Current home glucose readings fasting glucose: *** post prandial glucose: *** -{ACTIONS;DENIES/REPORTS:21021675::"Denies"} hypoglycemic/hyperglycemic symptoms -Current meal patterns:  breakfast: ***  lunch: ***  dinner: *** snacks: *** drinks: *** -Current exercise: *** -Educated on {CCM DM COUNSELING:25123} -Counseled to check feet daily and get yearly eye exams -{CCMPHARMDINTERVENTION:25122}  Atrial Fibrillation (Goal: prevent stroke and major bleeding) -{US controlled/uncontrolled:25276} -CHADSVASC: *** -Current treatment: Rate control: *** Anticoagulation: *** -Medications previously tried: *** -Home BP and HR readings: ***  -Counseled on {CCMAFIBCOUNSELING:25120} -{CCMPHARMDINTERVENTION:25122}  Patient Goals/Self-Care Activities Patient will:  - {pharmacypatientgoals:24919}  Follow Up Plan: {CM FOLLOW UP PLBBCW:88891}

## 2021-08-06 ENCOUNTER — Encounter: Payer: Self-pay | Admitting: Gastroenterology

## 2021-08-08 ENCOUNTER — Telehealth: Payer: Self-pay | Admitting: Pharmacist

## 2021-08-08 NOTE — Chronic Care Management (AMB) (Signed)
Chronic Care Management Pharmacy Assistant   Name: Matthew Chen  MRN: [id number] DOB: 1956/03/29   Reason for Encounter: Chart Review For Initial Visit With Clinical Pharmacist   Conditions to be addressed/monitored: HTN, PAD, CAD, Atherosclerotic Heart Disease, Atrial Fibrillation, Ulcerative Colitis, Hepatocellular Carcinoma, DM II, CKD, HLD  Primary concerns for visit include: HTN, Heart Disease, Atrial Fibrillation, DM II, CKD, HLD   Recent office visits:  None  Recent consult visits:  06/20/2021 OV [name]) [name], MD;  Begin prednisone 40 mg qd with a 10 mg/week taper to 20 mg qd and then maintain.  Begin glycopyrrolate 1 mg p.o. twice daily as needed abdominal pain, abdominal cramping.  06/04/2021 OV (Cardiology) [name], [name], PA; Atrial Fib office visit, no medication changes indicated.  05/21/2021 OV (Oncology) [name], MD; no medication changes indicated.  05/17/2021 OV (Cardiology) [name], [name]; BP soft. 102/60 in the right arm and 98/54 in the left arm. Patient asymptomatic with this. He states BP is normally higher at home but has been softer lately. - Current medications: Amlodipine 13m daily, Coreg 37.51mtwice daily, Lisinopril-HCTZ 20-2533maily. Discussed stopping the HCTZ portion of combo medications. However, patient preferred to not make any changes. Given he is asymptomatic this is OK.   05/09/2021 OV (GaGertie FeytaLadene ArtistD; no medication changes indicated.  05/04/2021 OV (General Surgery) [name]; no medication changes indicated.  04/02/2021 OV (Cardiology) AllThompson GrayerD; He is on both ASA and eliquis.  Stop ASA when ok with Dr BerGwenlyn Found07/20/2022 OV (Cardiology) [name]; Stop Plavix, [facility]:  02/08/2021 Admission (CATHLAB) [name];  Clinical Evaluation Leading to the Procedure:  Anginal Classification: CCS II   Anti-ischemic medical  therapy: Minimal Therapy (1 class of medications)   Non-Invasive Test Results: Low-risk stress test findings: cardiac mortality <1%/year   Prior CABG: No previous CABG  LEFT HEART CATH AND CORONARY ANGIOGRAPHY (N/A) as a surgical intervention  Medications: Outpatient Encounter Medications as of 08/08/2021  Medication Sig   acetaminophen (TYLENOL) 500 MG tablet Take 1,000 mg by mouth every 6 (six) hours as needed for moderate pain or headache.   amLODipine (NORVASC) 5 MG tablet Take 1 tablet (5 mg total) by mouth daily.   apixaban (ELIQUIS) 5 MG TABS tablet Take 1 tablet (5 mg total) by mouth 2 (two) times daily.   aspirin EC 81 MG tablet Take 1 tablet (81 mg total) by mouth daily.   atorvastatin (LIPITOR) 80 MG tablet Take 1 tablet (80 mg total) by mouth daily.   B-D UF III MINI PEN NEEDLES 31G X 5 MM MISC USE DAILY WITH VICTOZA   carvedilol (COREG) 12.5 MG tablet Take 3 tablets (37.5 mg total) by mouth 2 (two) times daily.   Continuous Blood Gluc Sensor (FREESTYLE LIBRE 2 SENSOR) MISC CHECK BLOOD SUGAR FOUR TIMES DAILY   empagliflozin (JARDIANCE) 25 MG TABS tablet Take 1 tablet (25 mg total) by mouth daily before breakfast.   fenofibrate 160 MG tablet Take 1 tablet (160 mg total) by mouth daily.   glipiZIDE (GLUCOTROL XL) 5 MG 24 hr tablet TAKE 1 TABLET BY MOUTH EVERY DAY WITH BREAKFAST   glucose blood (ONETOUCH ULTRA) test strip CHECK BLOOD SUGAR TWICE A DAY   glycopyrrolate (ROBINUL) 1 MG tablet Take 1 tablet (1 mg total) by mouth 2 (two) times daily as needed.   liraglutide (VICTOZA) 18 MG/3ML SOPN INJECT 1.2 MG  UNDER THE SKIN ONCE DAILY   lisinopril-hydrochlorothiazide (ZESTORETIC) 20-25 MG tablet Take 1 tablet by mouth daily.   loperamide (IMODIUM A-D) 2 MG tablet Take 4 mg by mouth 4 (four) times daily as needed for diarrhea or loose stools.   Omega-3 Fatty Acids (FISH OIL) 1000 MG CAPS Take 1,000 mg by mouth 2 (two) times daily.   ondansetron (ZOFRAN ODT) 4 MG disintegrating  tablet Take 1 tablet (4 mg total) by mouth every 8 (eight) hours as needed for nausea or vomiting.   pantoprazole (PROTONIX) 40 MG tablet Take 1 tablet (40 mg total) by mouth 2 (two) times daily.   pioglitazone (ACTOS) 30 MG tablet TAKE 1 TABLET BY MOUTH EVERY DAY STOP TRADJENTA   predniSONE (DELTASONE) 10 MG tablet Take 4 (40 mg) tablets by mouth daily x 1 week, then reduce to 3 (30 mg) tablets by mouth daily x 1 week, then reduce to 2 (20 mg) tablets by mouth daily and stay on this dose until colonoscopy. (Patient not taking: Reported on 07/11/2021)   vedolizumab (ENTYVIO) 300 MG injection Inject 300 mg as directed See admin instructions. Every 2 months   No facility-administered encounter medications on file as of 08/08/2021.   Current Medications: Carvedilol 12.5 mg last filled 07/31/2021 30 DS Ondansetron 4 mg last filled 06/21/2021 6 DS Glycopyrrolate 1 mg last filled 07/16/2021 30 DS Prednisone 10 mg Imodium A-D 2 mg Acetaminophen 500 mg Freestyle Libre sensor  last filled 03/28/2021 14 DS Glipizide 5 mg last filled 06/09/2021 90 DS Actos 30 mg last filled 06/18/2021 90 DS Jardiance 25 mg last filled 06/17/2021 90 DS Amlodipine 5 mg last filled 06/13/2021 60 DS Eliquis 5 mg last filled 07/03/2021 45 DS Fenofibrate 160 mg last filled 07/12/2021 90 DS Lisinopril-HCTZ 20-25 mg last filled 08/04/2021 90 DS Pantoprazole 40 mg last filled 07/04/2021 30 DS Victoza 18 mg/3 mL last filled 07/19/2021 30 DS Atorvastatin 80 mg last filled 06/13/2021 60 DS Entyvio 300 mg injection Aspirin 81 mg  Patient Questions: Any changes in your medications or health? Patient denies any changes in his medications or health.  Any side effects from any medications?  Patient denies any side effects from any of his medications.  Do you have any symptoms or problems not managed by your medications? Patient denies having any symptoms or problems that are not currently managed by his medications.  Any  concerns about your health right now? Patient denies having any major concerns with his health at this time.  Has your provider asked that you check blood pressure, blood sugar, or follow special diet at home? Patient states he checks his sugars, he does not check his blood pressure. He does not follow any special diet.  Do you get any type of exercise on a regular basis? "I was walking but haven't lately."  Can you think of a goal you would like to reach for your health? Patient states he would like to be cancer free.  Do you have any problems getting your medications? Victoza, Eliquis, Jardiance are all expensive.  Is there anything that you would like to discuss during the appointment?   Please bring medications and supplements to appointment  Care Gaps: Medicare Annual Wellness: Due now - states he had done two weeks ago with an in home nurse Ophthalmology Exam: Next due on 07/17/2022 Foot Exam: Next due on 11/20/2021 Hemoglobin A1C: 7.8% on 03/02/2021 Colonoscopy: Completed 07/11/2021  Future Appointments  Date Time Provider [place]  08/10/2021  2:00 PM  [facility] PHARMACIST [facility] None  08/24/2021  8:00 AM Lorretta Harp, MD [facility]  12/03/2021  9:00 AM [facility] VASC 3 [facility]  01/18/2022 11:20 AM Ladell Pier, MD [facility] None    Star Rating Drugs: Victoza 18 mg/3 mL last filled 07/19/2021 30 DS Atorvastatin 80 mg last filled 06/13/2021 60 DS Lisinopril-HCTZ 20-25 mg last filled 08/04/2021 90 DS Jardiance 25 mg last filled 06/17/2021 90 DS Actos 30 mg last filled 06/18/2021 90 DS  April D [place], [place] Pharmacist Assistant [phone]

## 2021-08-09 ENCOUNTER — Other Ambulatory Visit: Payer: Self-pay | Admitting: Cardiology

## 2021-08-10 ENCOUNTER — Telehealth: Payer: 59

## 2021-08-10 NOTE — Progress Notes (Signed)
Chronic Care Management Pharmacy Note  08/02/2021 Name:  Matthew Chen MRN:  [id number] DOB:  05-24-1956  Summary: Initial visit with PharmD.  Main concern was copay on meds.  Recommended he schedule visit for A1c check.  Discussed dietary mods to improve glucose.  Recommendations/Changes made from today's visit: PAP started for Jardiance and Victoza  Plan: FU 4 months   Subjective: Matthew Chen is an 65 y.o. year old male who is a primary patient of Pickard, Matthew Mcgee, MD.  The CCM team was consulted for assistance with disease management and care coordination needs.    Engaged with patient by telephone for initial visit in response to provider referral for pharmacy case management and/or care coordination services.   Consent to Services:  The patient was given the following information about Chronic Care Management services today, agreed to services, and gave verbal consent: 1. CCM service includes personalized support from designated clinical staff supervised by the primary care provider, including individualized plan of care and coordination with other care providers 2. 24/7 contact phone numbers for assistance for urgent and routine care needs. 3. Service will only be billed when office clinical staff spend 20 minutes or more in a month to coordinate care. 4. Only one practitioner may furnish and bill the service in a calendar month. 5.The patient may stop CCM services at any time (effective at the end of the month) by phone call to the office staff. 6. The patient will be responsible for cost sharing (co-pay) of up to 20% of the service fee (after annual deductible is met). Patient agreed to services and consent obtained.  Patient Care Team: Matthew Frizzle, MD as PCP - General (Family Medicine) Matthew Harp, MD as PCP - Cardiology (Cardiology) Matthew Frizzle, MD (Family Medicine) Matthew Latch, MD as Attending Physician (Cardiology) Matthew Harp, MD as Consulting  Physician (Cardiology) Matthew Chen, [facility] as Pharmacist (Pharmacist)  Recent office visits:  None   Recent consult visits:  06/20/2021 OV Matthew Chen) Matthew Artist, MD;  Begin prednisone 40 mg qd with a 10 mg/week taper to 20 mg qd and then maintain.  Begin glycopyrrolate 1 mg p.o. twice daily as needed abdominal pain, abdominal cramping.   06/04/2021 OV (Cardiology) Matthew Chen, Matthew R, PA; Atrial Fib office visit, no medication changes indicated.   05/21/2021 OV (Oncology) Matthew Graham, MD; no medication changes indicated.   05/17/2021 OV (Cardiology) Matthew Chen E, [name]; BP soft. 102/60 in the right arm and 98/54 in the left arm. Patient asymptomatic with this. He states BP is normally higher at home but has been softer lately. - Current medications: Amlodipine 72m daily, Coreg 37.536mtwice daily, Lisinopril-HCTZ 20-2525maily. Discussed stopping the HCTZ portion of combo medications. However, patient preferred to not make any changes. Given he is asymptomatic this is OK.    05/09/2021 OV (GaGertie FeytaLadene ArtistD; no medication changes indicated.   05/04/2021 OV (General Surgery) Matthew FickD; no medication changes indicated.   04/02/2021 OV (Cardiology) AllThompson GrayerD; He is on both ASA and eliquis.  Stop ASA when ok with Dr Matthew Chen 02/28/2021 OV (Cardiology) Matthew LewandowskyD; [facility]  02/08/2021 Admission (CATHLAB) Matthew HarpD;  Clinical Evaluation Leading to the Procedure:   Anginal Classification: CCS II   Anti-ischemic medical therapy: Minimal Therapy (1 class of medications)   Non-Invasive Test Results: Low-risk stress test findings: cardiac mortality <1%/year  Prior CABG: No previous CABG   LEFT HEART CATH AND CORONARY ANGIOGRAPHY (N/A) as a surgical intervention  Objective:  Lab Results  Component Value Date   CREATININE 1.79 (H) 06/21/2021   BUN 31 (H) 06/21/2021   GFR 34.75  (L) 04/17/2021   GFRNONAA 42 (L) 06/21/2021   GFRAA 49 (L) 11/20/2020   NA 139 06/21/2021   K 4.3 06/21/2021   CALCIUM 9.4 06/21/2021   CO2 18 (L) 06/21/2021   GLUCOSE 118 (H) 06/21/2021    Lab Results  Component Value Date/Time   HGBA1C 7.8 (H) 03/02/2021 08:07 AM   HGBA1C 6.8 (H) 11/20/2020 08:30 AM   GFR 34.75 (L) 04/17/2021 02:01 PM   GFR 57.28 (L) 04/30/2018 03:24 PM   MICROALBUR 0.2 11/20/2020 08:30 AM   MICROALBUR 0.2 04/25/2020 12:05 PM    Last diabetic Eye exam:  Lab Results  Component Value Date/Time   HMDIABEYEEXA No Retinopathy 06/26/2020 10:54 AM    Last diabetic Foot exam: No results Chen for: HMDIABFOOTEX   Lab Results  Component Value Date   CHOL 133 03/02/2021   HDL 30 (L) 03/02/2021   LDLCALC 68 03/02/2021   TRIG 266 (H) 03/02/2021   CHOLHDL 4.4 03/02/2021    Hepatic Function Latest Ref Rng & Units 06/21/2021 06/08/2021 04/17/2021  Total Protein 6.5 - 8.1 g/dL 7.6 7.3 7.2  Albumin 3.5 - 5.0 g/dL 3.8 3.7 4.0  AST 15 - 41 U/L 24 32 22  ALT 0 - 44 U/L _0 Alk Phosphatase 38 - 126 U/L 55 78 57  Total Bilirubin 0.3 - 1.2 mg/dL 0.5 0.6 0.4  Bilirubin, Direct 0.0 - 0.3 mg/dL - - -    Lab Results  Component Value Date/Time   TSH 1.42 04/17/2021 02:01 PM   TSH 1.25 02/02/2018 09:53 AM    CBC Latest Ref Rng & Units 06/21/2021 06/08/2021 04/24/2021  WBC 4.0 - 10.5 K/uL 7.3 5.3 5.2  Hemoglobin 13.0 - 17.0 g/dL 11.6(L) 11.6(L) 11.6(L)  Hematocrit 39.0 - 52.0 % 36.8(L) 36.9(L) 36.7(L)  Platelets 150 - 400 K/uL 299 279 278    No results Chen for: VD25OH  Clinical ASCVD: Yes  The 10-year ASCVD risk score (Arnett DK, et al., 2019) is: 19.6%   Values used to calculate the score:     Age: 61 years     Sex: Male     Is Non-Hispanic African American: No     Diabetic: Yes     Tobacco smoker: No     Systolic Blood Pressure: 829 mmHg     Is BP treated: Yes     HDL Cholesterol: 30 mg/dL     Total Cholesterol: 133 mg/dL    Depression screen [facility]  2/9 11/20/2020 04/25/2020 05/03/2019  Decreased Interest 0 0 0  Down, Depressed, Hopeless 0 0 0  PHQ - 2 Score 0 0 0  Altered sleeping - - -  Tired, decreased energy - - -  Change in appetite - - -  Feeling bad or failure about yourself  - - -  Trouble concentrating - - -  Moving slowly or fidgety/restless - - -  Suicidal thoughts - - -  PHQ-9 Score - - -  Difficult doing work/chores - - -  Some recent data might be hidden     Social History   Tobacco Use  Smoking Status Former   Packs/day: 1.50   Years: 25.00   Pack years: 37.50   Types: Cigarettes   Quit date: 08/12/1996  Years since quitting: 24.9  Smokeless Tobacco Never   BP Readings from Last 3 Encounters:  07/11/21 105/77  06/21/21 114/66  06/20/21 118/68   Pulse Readings from Last 3 Encounters:  07/11/21 97  06/21/21 81  06/20/21 (!) 55   Wt Readings from Last 3 Encounters:  07/11/21 230 lb (104.3 kg)  06/21/21 229 lb 15 oz (104.3 kg)  06/20/21 230 lb (104.3 kg)   BMI Readings from Last 3 Encounters:  07/11/21 33.00 kg/m  06/21/21 32.99 kg/m  06/20/21 33.00 kg/m    Assessment/Interventions: Review of patient past medical history, allergies, medications, health status, including review of consultants reports, laboratory and other test data, was performed as part of comprehensive evaluation and provision of chronic care management services.   SDOH:  (Social Determinants of Health) assessments and interventions performed: Yes  Financial Resource Strain: Low Risk    Difficulty of Paying Living Expenses: Not very hard     SDOH Screenings   Alcohol Screen: Low Risk    Last Alcohol Screening Score (AUDIT): 1  Depression (PHQ2-9): Low Risk    PHQ-2 Score: 0  Financial Resource Strain: Not on file  Food Insecurity: Not on file  Housing: Not on file  Physical Activity: Not on file  Social Connections: Not on file  Stress: Not on file  Tobacco Use: Medium Risk   Smoking Tobacco Use: Former    Smokeless Tobacco Use: Never   Passive Exposure: Not on file  Transportation Needs: Not on file    CCM Care Plan  Allergies  Allergen Reactions   Penicillins Other (See Comments)    Unsure, childhood reaction Has patient had a PCN reaction causing immediate rash, facial/tongue/throat swelling, SOB or lightheadedness with hypotension: Unknown Has patient had a PCN reaction causing severe rash involving mucus membranes or skin necrosis: Unknown Has patient had a PCN reaction that required hospitalization: No Has patient had a PCN reaction occurring within the last 10 years: No If all of the above answers are "NO", then may proceed with Cephalosporin use.     Medications Reviewed Today     Reviewed by Matthew Artist, MD (Physician) on 07/11/21 at 1004  Med List Status: <None>   Medication Order Taking? Sig Documenting Provider Last Dose Status Informant  0.9 %  sodium chloride infusion [id number]   Matthew Artist, MD  Active   acetaminophen (TYLENOL) 500 MG tablet [id number] No Take 1,000 mg by mouth every 6 (six) hours as needed for moderate pain or headache. [provider] Unknown Active Self  amLODipine (NORVASC) 5 MG tablet [id number] Yes Take 1 tablet (5 mg total) by mouth daily. Matthew Frizzle, MD 07/10/2021 Active Self  apixaban (ELIQUIS) 5 MG TABS tablet [id number] No Take 1 tablet (5 mg total) by mouth 2 (two) times daily. Matthew Frizzle, MD 07/08/2021 Active Self  aspirin EC 81 MG tablet [id number] No Take 1 tablet (81 mg total) by mouth daily. Matthew Latch, MD 07/08/2021 Active Self  atorvastatin (LIPITOR) 80 MG tablet [id number] Yes Take 1 tablet (80 mg total) by mouth daily. Kathyrn Drown D, NP 07/10/2021 Active Self  B-D UF III MINI PEN NEEDLES 31G X 5 MM MISC [id number] Yes USE DAILY WITH Mel Almond, MD 07/10/2021 Active   carvedilol (COREG) 12.5 MG tablet [id number] Yes Take 3 tablets (37.5 mg total) by mouth 2 (two) times daily. Matthew Frizzle, MD 07/10/2021 Active Self  Continuous Blood Gluc Sensor (FREESTYLE LIBRE 2 SENSOR) [place] [id number] Yes  CHECK BLOOD SUGAR FOUR TIMES DAILY Matthew Frizzle, MD 07/10/2021 Active Self  empagliflozin (JARDIANCE) 25 MG TABS tablet [id number] Yes Take 1 tablet (25 mg total) by mouth daily before breakfast. Matthew Frizzle, MD 07/10/2021 Active Self  fenofibrate 160 MG tablet [id number] Yes Take 1 tablet (160 mg total) by mouth daily. Matthew Frizzle, MD 07/10/2021 Active Self  glipiZIDE (GLUCOTROL XL) 5 MG 24 hr tablet [id number] Yes TAKE 1 TABLET BY MOUTH EVERY DAY WITH BREAKFAST Matthew Frizzle, MD 07/10/2021 Active Self  glucose blood (ONETOUCH ULTRA) test strip [id number] Yes CHECK BLOOD SUGAR TWICE A DAY Matthew Frizzle, MD 07/10/2021 Active Self  glycopyrrolate (ROBINUL) 1 MG tablet [id number] Yes Take 1 tablet (1 mg total) by mouth 2 (two) times daily as needed. Matthew Artist, MD 07/10/2021 Active   liraglutide (VICTOZA) 18 MG/3ML SOPN [id number] Yes INJECT 1.2 MG UNDER THE SKIN ONCE DAILY Matthew Frizzle, MD 07/10/2021 Active Self  lisinopril-hydrochlorothiazide (ZESTORETIC) 20-25 MG tablet [id number] Yes Take 1 tablet by mouth daily. Matthew Frizzle, MD 07/10/2021 Active Self  loperamide (IMODIUM A-D) 2 MG tablet [id number] No Take 4 mg by mouth 4 (four) times daily as needed for diarrhea or loose stools. [provider] Unknown Active Self  Omega-3 Fatty Acids (FISH OIL) 1000 MG CAPS [id number] Yes Take 1,000 mg by mouth 2 (two) times daily. [provider] 07/10/2021 Active Self  ondansetron (ZOFRAN ODT) 4 MG disintegrating tablet [id number] Yes Take 1 tablet (4 mg total) by mouth every 8 (eight) hours as needed for nausea or vomiting. Tyson Alias, NP Past Week Active   pantoprazole (PROTONIX) 40 MG tablet [id number] Yes Take 1 tablet (40 mg total) by mouth 2 (two) times daily. Matthew Frizzle, MD 07/10/2021 Active Self  pioglitazone (ACTOS) 30 MG tablet  [id number] Yes TAKE 1 TABLET BY MOUTH EVERY DAY STOP TRADJENTA Matthew Frizzle, MD 07/10/2021 Active Self  predniSONE (DELTASONE) 10 MG tablet [id number] No Take 4 (40 mg) tablets by mouth daily x 1 week, then reduce to 3 (30 mg) tablets by mouth daily x 1 week, then reduce to 2 (20 mg) tablets by mouth daily and stay on this dose until colonoscopy.  Patient not taking: Reported on 07/11/2021   Matthew Artist, MD Not Taking Active   vedolizumab (ENTYVIO) 300 MG injection [id number] No Inject 300 mg as directed See admin instructions. Every 2 months [provider] 06/08/2021 Active Self            Patient Active Problem List   Diagnosis Date Noted   Secondary hypercoagulable state ([place]) 06/04/2021   CKD (chronic kidney disease), stage III ([place]) 05/03/2021   Hepatocellular carcinoma ([place]) 05/03/2021   Paroxysmal atrial fibrillation ([place]) 02/28/2021   Erectile dysfunction 02/17/2021   Coronary artery disease 02/15/2021   CAD (coronary artery disease) 02/15/2021   Atherosclerotic heart disease of native coronary artery without angina pectoris 02/15/2021   Claudication in peripheral vascular disease ([place]) 04/27/2018   Clotting disorder ([place])    Arthritis    Bicuspid aortic valve 09/09/2016   Aortic aneurysm, thoracic 09/09/2016   Hyperlipidemia    Type 2 diabetes mellitus with complication, with long-term current use of insulin ([place])    Claudication ([place]) 06/29/2015   Peripheral arterial disease ([place]) 06/28/2015   Hypertension 10/05/2014   Ulcerative colitis ([place]) 07/04/2008   PERSONAL HX COLONIC POLYPS 07/04/2008   Adenomatous colon polyp 03/12/2008   ULCERATIVE PROCTITIS 03/08/2008   ULCERATIVE PROCTOSIGMOIDITIS 03/08/2008  Immunization History  Administered Date(s) Administered   Influenza Inj Mdck Quad Pf 06/14/2017   Influenza, High Dose Seasonal PF 05/22/2021   Influenza,inj,Quad PF,6+ Mos 07/22/2013, 06/12/2015, 06/10/2016, 04/21/2018, 05/03/2019    Influenza-Unspecified 06/14/2017, 04/21/2018   Pneumococcal Polysaccharide-23 06/23/2017    Conditions to be addressed/monitored:  HTN, CAD, Afib, Type II DM, HLD  Patient Care Plan: General Pharmacy (Adult)     Problem Identified: HTN, CAD, Afib, Type II DM, HLD   Priority: High  Onset Date: 08/17/2021     Long-Range Goal: Patient-Specific Goal   Start Date: 08/17/2021  Expected End Date: 02/14/2022  This Visit's Progress: On track  Priority: High  Note:   Current Barriers:  Unable to independently afford treatment regimen Unable to achieve control of A1c    Pharmacist Clinical Goal(s):  Patient will achieve improvement in a1c  as evidenced by labs through collaboration with PharmD and provider.   Interventions: 1:1 collaboration with Matthew Frizzle, MD regarding development and update of comprehensive plan of care as evidenced by provider attestation and co-signature Inter-disciplinary care team collaboration (see longitudinal plan of care) Comprehensive medication review performed; medication list updated in electronic medical record  Hypertension (BP goal <130/80) -Controlled -Current treatment: Amlodipine 70m daily Lisinopril-HCTZ 20/235mdaily -Medications previously tried: valsartan  -Current home readings: has not been checking at home really -Current dietary habits: does not really follow a strict diet, see DM for more specifics -Current exercise habits: minimal, he gets SOB easily while walking -Denies hypotensive/hypertensive symptoms -Educated on BP goals and benefits of medications for prevention of heart attack, stroke and kidney damage; Importance of home blood pressure monitoring; -Counseled to monitor BP at home periodically, document, and provide log at future appointments -Counseled on diet and exercise extensively Recommended to continue current medication  Hyperlipidemia: (LDL goal < 70) -Controlled -Current treatment: Atorvastatin 8068mdaily Fenofibrate 160m67mily -Medications previously tried: none noted  -Current dietary patterns: see DM -Current exercise habits: minimal -Educated on Cholesterol goals;  Benefits of statin for ASCVD risk reduction; Importance of limiting foods high in cholesterol; -Recommended to continue current medication Most recent LDL is controlled, TG's elevated  Counseled on dietary mods to decrease these  Diabetes (A1c goal <7%) -Uncontrolled -Current medications: Victoza 1.2 mg under the skin once daily Jardiance 25mg38mly Pioglitazone 30mg 76my Glipizide XL 5mg da43m -Medications previously tried: Invokana  -Current home glucose readings fasting glucose: 97,  post prandial glucose: 90s -Denies hypoglycemic/hyperglycemic symptoms -Current meal patterns:  breakfast: ham sandwich lunch: bacon eggs on toast dinner: steak and cheese sub, fries, salad snacks:  drinks: water mainly -Current exercise: was walking but not as much within the last 6 months -Educated on A1c and blood sugar goals; Exercise goal of 150 minutes per week; Benefits of routine self-monitoring of blood sugar; -Counseled to check feet daily and get yearly eye exams -Recommended to continue current medication Assessed patient finances. He is having trouble with copays for Victoza and Jardiance.  Initiated PAP for these. Counseled on diet changes, recommended he make appt for updated A1c check.  He has not had checked since starting Jardiance. No changes to meds - schedule PCP visit.  Atrial Fibrillation (Goal: prevent stroke and major bleeding) -Controlled -Current treatment: Rate control: Carvedilol 12.5 3 tablets po BID Anticoagulation: Eliquis 5mg BID34medications previously tried: none noted -Home BP and HR readings: not checking  -Counseled on increased risk of stroke due to Afib and benefits of anticoagulation for stroke prevention; importance of adherence Eliquis  to anticoagulant exactly as  prescribed; bleeding risk associated with Eliquis and importance of self-monitoring for signs/symptoms of bleeding; -Counseled on diet and exercise extensively Recommended to continue current medication  Patient Goals/Self-Care Activities Patient will:  - take medications as prescribed as evidenced by patient report and record review focus on medication adherence by pill box check blood pressure daily, document, and provide at future appointments  Follow Up Plan: The care management team will reach out to the patient again over the next 180 days.         Medication Assistance: Application for Victoza and Jardiance medication assistance program. in process.  Anticipated assistance start date unknows.  See plan of care for additional detail.  Compliance/Adherence/Medication fill history: Care Gaps: Pneumonia vaccine  Star-Rating Drugs: Victoza 18 mg/3 mL last filled 07/19/2021 30 DS Atorvastatin 80 mg last filled 06/13/2021 60 DS Lisinopril-HCTZ 20-25 mg last filled 08/04/2021 90 DS Jardiance 25 mg last filled 06/17/2021 90 DS Actos 30 mg last filled 06/18/2021 90 DS  Patient's preferred pharmacy is:  [id number] [place], [place] - [id number] [id number]: [phone]: [phone] [id number] [address] [id number]: [phone]: [phone] pill box? Yes Pt endorses 100% compliance  We discussed: Benefits of medication synchronization, packaging and delivery as well as enhanced pharmacist oversight with [employer]. Patient decided to: Continue current medication management strategy  Care Plan and Follow Up Patient Decision:  Patient agrees to Care Plan and Follow-up.  Plan: The care management team will reach out to the patient again over the next 180  days.  ChBeverly MilchPharmD, CPP Clinical Pharmacist Practitioner [phone] Follow Up Plan: The care management team will reach out to the patient again over the next 180 days.

## 2021-08-17 ENCOUNTER — Ambulatory Visit (INDEPENDENT_AMBULATORY_CARE_PROVIDER_SITE_OTHER): Payer: HMO | Admitting: Pharmacist

## 2021-08-17 DIAGNOSIS — Z794 Long term (current) use of insulin: Secondary | ICD-10-CM

## 2021-08-17 DIAGNOSIS — I480 Paroxysmal atrial fibrillation: Secondary | ICD-10-CM

## 2021-08-17 DIAGNOSIS — E7800 Pure hypercholesterolemia, unspecified: Secondary | ICD-10-CM

## 2021-08-17 DIAGNOSIS — I10 Essential (primary) hypertension: Secondary | ICD-10-CM

## 2021-08-17 DIAGNOSIS — E118 Type 2 diabetes mellitus with unspecified complications: Secondary | ICD-10-CM

## 2021-08-17 DIAGNOSIS — I25118 Atherosclerotic heart disease of native coronary artery with other forms of angina pectoris: Secondary | ICD-10-CM

## 2021-08-17 NOTE — Patient Instructions (Addendum)
Visit Information   Goals Addressed             This Visit's Progress    Set My Target A1C-Diabetes Type 2       Timeframe:  Long-Range Goal Priority:  High Start Date:     08/17/21                        Expected End Date:   02/14/22                    Follow Up Date 11/15/21    - set target A1C    Why is this important?   Your target A1C is decided together by you and your doctor.  It is based on several things like your age and other health issues.    Notes:  A1c < 7       Patient Care Plan: General Pharmacy (Adult)     Problem Identified: HTN, CAD, Afib, Type II DM, HLD   Priority: High  Onset Date: 08/17/2021     Long-Range Goal: Patient-Specific Goal   Start Date: 08/17/2021  Expected End Date: 02/14/2022  This Visit's Progress: On track  Priority: High  Note:   Current Barriers:  Unable to independently afford treatment regimen Unable to achieve control of A1c    Pharmacist Clinical Goal(s):  Patient will achieve improvement in a1c  as evidenced by labs through collaboration with PharmD and provider.   Interventions: 1:1 collaboration with Susy Frizzle, MD regarding development and update of comprehensive plan of care as evidenced by provider attestation and co-signature Inter-disciplinary care team collaboration (see longitudinal plan of care) Comprehensive medication review performed; medication list updated in electronic medical record  Hypertension (BP goal <130/80) -Controlled -Current treatment: Amlodipine 82m daily Lisinopril-HCTZ 20/263mdaily -Medications previously tried: valsartan  -Current home readings: has not been checking at home really -Current dietary habits: does not really follow a strict diet, see DM for more specifics -Current exercise habits: minimal, he gets SOB easily while walking -Denies hypotensive/hypertensive symptoms -Educated on BP goals and benefits of medications for prevention of heart attack, stroke and kidney  damage; Importance of home blood pressure monitoring; -Counseled to monitor BP at home periodically, document, and provide log at future appointments -Counseled on diet and exercise extensively Recommended to continue current medication  Hyperlipidemia: (LDL goal < 70) -Controlled -Current treatment: Atorvastatin 8088maily Fenofibrate 160m42mily -Medications previously tried: none noted  -Current dietary patterns: see DM -Current exercise habits: minimal -Educated on Cholesterol goals;  Benefits of statin for ASCVD risk reduction; Importance of limiting foods high in cholesterol; -Recommended to continue current medication Most recent LDL is controlled, TG's elevated  Counseled on dietary mods to decrease these  Diabetes (A1c goal <7%) -Uncontrolled -Current medications: Victoza 1.2 mg under the skin once daily Jardiance 25mg47mly Pioglitazone 30mg 25my Glipizide XL 5mg da70m -Medications previously tried: Invokana  -Current home glucose readings fasting glucose: 97,  post prandial glucose: 90s -Denies hypoglycemic/hyperglycemic symptoms -Current meal patterns:  breakfast: ham sandwich lunch: bacon eggs on toast dinner: steak and cheese sub, fries, salad snacks:  drinks: water mainly -Current exercise: was walking but not as much within the last 6 months -Educated on A1c and blood sugar goals; Exercise goal of 150 minutes per week; Benefits of routine self-monitoring of blood sugar; -Counseled to check feet daily and get yearly eye exams -Recommended to continue current medication Assessed patient finances. He is having  trouble with copays for Victoza and Jardiance.  Initiated PAP for these. Counseled on diet changes, recommended he make appt for updated A1c check.  He has not had checked since starting Jardiance. No changes to meds - schedule PCP visit.  Atrial Fibrillation (Goal: prevent stroke and major bleeding) -Controlled -Current treatment: Rate control:  Carvedilol 12.5 3 tablets po BID Anticoagulation: Eliquis 56m BID -Medications previously tried: none noted -Home BP and HR readings: not checking  -Counseled on increased risk of stroke due to Afib and benefits of anticoagulation for stroke prevention; importance of adherence Eliquis to anticoagulant exactly as prescribed; bleeding risk associated with Eliquis and importance of self-monitoring for signs/symptoms of bleeding; -Counseled on diet and exercise extensively Recommended to continue current medication  Patient Goals/Self-Care Activities Patient will:  - take medications as prescribed as evidenced by patient report and record review focus on medication adherence by pill box check blood pressure daily, document, and provide at future appointments  Follow Up Plan: The care management team will reach out to the patient again over the next 180 days.      Mr. HLevingstonwas given information about Chronic Care Management services today including:  CCM service includes personalized support from designated clinical staff supervised by his physician, including individualized plan of care and coordination with other care providers 24/7 contact phone numbers for assistance for urgent and routine care needs. Standard insurance, coinsurance, copays and deductibles apply for chronic care management only during months in which we provide at least 20 minutes of these services. Most insurances cover these services at 100%, however patients may be responsible for any copay, coinsurance and/or deductible if applicable. This service may help you avoid the need for more expensive face-to-face services. Only one practitioner may furnish and bill the service in a calendar month. The patient may stop CCM services at any time (effective at the end of the month) by phone call to the office staff.  Patient agreed to services and verbal consent obtained.   The patient verbalized understanding of instructions,  educational materials, and care plan provided today and agreed to receive a mailed copy of patient instructions, educational materials, and care plan.  Telephone follow up appointment with pharmacy team member scheduled for: 6 months  CEdythe Clarity RBeachwood PharmD, CMatherClinical Pharmacist Practitioner [place]([phone]

## 2021-08-21 ENCOUNTER — Telehealth: Payer: Self-pay | Admitting: Pharmacist

## 2021-08-21 NOTE — Progress Notes (Signed)
° ° °  Chronic Care Management Pharmacy Assistant   Name: Matthew Chen  MRN: [id number] DOB: 10-May-1956  Reason for Encounter: PAP for Jardiance and Victoza    PAP form initiated for Jardiance and Victoza. Will be mailed to patient to complete patient portion and return to prescribing MD office for final portion to be completed by MD and faxed in for patient for processing. Patient will update on the outcome of her application once received.    Jobe Gibbon, [facility] Clinical Pharmacist Assistant  [phone]

## 2021-08-24 ENCOUNTER — Other Ambulatory Visit: Payer: Self-pay

## 2021-08-24 ENCOUNTER — Ambulatory Visit: Payer: HMO | Admitting: Cardiovascular Disease

## 2021-08-24 ENCOUNTER — Encounter: Payer: Self-pay | Admitting: Cardiovascular Disease

## 2021-08-24 VITALS — BP 112/66 | HR 57 | Ht 70.0 in | Wt 235.0 lb

## 2021-08-24 DIAGNOSIS — I2510 Atherosclerotic heart disease of native coronary artery without angina pectoris: Secondary | ICD-10-CM

## 2021-08-24 DIAGNOSIS — I7121 Aneurysm of the ascending aorta, without rupture: Secondary | ICD-10-CM

## 2021-08-24 DIAGNOSIS — I25118 Atherosclerotic heart disease of native coronary artery with other forms of angina pectoris: Secondary | ICD-10-CM

## 2021-08-24 DIAGNOSIS — Q231 Congenital insufficiency of aortic valve: Secondary | ICD-10-CM | POA: Diagnosis not present

## 2021-08-24 DIAGNOSIS — I10 Essential (primary) hypertension: Secondary | ICD-10-CM

## 2021-08-24 DIAGNOSIS — I739 Peripheral vascular disease, unspecified: Secondary | ICD-10-CM

## 2021-08-24 DIAGNOSIS — I480 Paroxysmal atrial fibrillation: Secondary | ICD-10-CM

## 2021-08-24 DIAGNOSIS — E782 Mixed hyperlipidemia: Secondary | ICD-10-CM

## 2021-08-24 NOTE — Assessment & Plan Note (Signed)
History of peripheral arterial disease status post multiple interventions on his SFA and popliteal arteries beginning 05/29/2015 when I demonstrated high-grade right popliteal artery stenosis and distal left SFA disease.  I angiogram to him 06/29/2015 and performed diamondback orbital rotational atherectomy, drug-eluting balloon angioplasty of the distal left SFA for subocclusive calcific/exophytic plaque.  He had a high-grade calcific exophytic subocclusive right popliteal artery plaque and underwent staged intervention using similar techniques 07/13/2015.  Artery angiogram 10 04/27/2018 revealing a 95% calcified right popliteal artery stenosis with three-vessel runoff.  I performed orbital atherectomy, PTA and stenting using a Tiger S 7 mm x 40 mm long self-expanding stent.  He has done well since.  He currently denies claudication.  His most recent Doppler studies performed 11/13/2020 revealed normal ABIs bilaterally with patent SFA and popliteal arteries.  These will be repeated on an annual basis.

## 2021-08-24 NOTE — Assessment & Plan Note (Signed)
History of essential hypertension a blood pressure measured today 112/66.  He is on amlodipine, carvedilol, lisinopril and hydrochlorothiazide.

## 2021-08-24 NOTE — Assessment & Plan Note (Signed)
History of CAD status postcardiac catheterization which I performed 6 flex 30/22 revealing a high-grade subtotally occluded ostial calcified first diagonal branch stenosis.  Because of chronic renal insufficiency elected to stage him and 1 week later I attempted to perform orbital atherectomy.  Unfortunately I perforated his coronary artery with a "Fielder XT wire".  Luckily, he did not develop pericardial tamponade and he was discharged home 2 weeks later.  After adjusting his antianginal medication he currently denies chest pain or shortness of breath.

## 2021-08-24 NOTE — Assessment & Plan Note (Signed)
History of hyperlipidemia on high-dose statin therapy with lipid profile performed 03/02/2021 revealing total cholesterol 133, LDL of 68 and HDL of 30.

## 2021-08-24 NOTE — Patient Instructions (Signed)
Medication Instructions:  Your physician recommends that you continue on your current medications as directed. Please refer to the Current Medication list given to you today.  *If you need a refill on your cardiac medications before your next appointment, please call your pharmacy*   Testing/Procedures: Your physician has requested that you have a lower extremity arterial duplex. This test is an ultrasound of the arteries in the legs. It looks at arterial blood flow in the legs. Allow one hour for Lower Arterial scans. There are no restrictions or special instructions  Your physician has requested that you have an ankle brachial index (ABI). During this test an ultrasound and blood pressure cuff are used to evaluate the arteries that supply the arms and legs with blood. Allow thirty minutes for this exam. There are no restrictions or special instructions. To be done in April 2023. These procedures are done at [facility].   Your physician has requested that you have an echocardiogram. Echocardiography is a painless test that uses sound waves to create images of your heart. It provides your doctor with information about the size and shape of your heart and how well your hearts chambers and valves are working. This procedure takes approximately one hour. There are no restrictions for this procedure. To be done in August 2023. This procedure is done at [address].    Follow-Up: At [facility], you and your health needs are our priority.  As part of our continuing mission to provide you with exceptional heart care, we have created designated Provider Care Teams.  These Care Teams include your primary Cardiologist (physician) and Advanced Practice Providers (APPs -  Physician Assistants and Nurse Practitioners) who all work together to provide you with the care you need, when you need it.  We recommend signing up for the patient portal called "MyChart".  Sign up information is  provided on this After Visit Summary.  MyChart is used to connect with patients for Virtual Visits (Telemedicine).  Patients are able to view lab/test results, encounter notes, upcoming appointments, etc.  Non-urgent messages can be sent to your provider as well.   To learn more about what you can do with MyChart, go to [email or web address].    Your next appointment:   12 month(s)  The format for your next appointment:   In Person  Provider:   Quay Burow, MD

## 2021-08-24 NOTE — Assessment & Plan Note (Signed)
History of bicuspid aortic valve with mild aortic stenosis by 2D echo recently checked 03/13/2021.  This will be repeated on an annual basis.

## 2021-08-24 NOTE — Assessment & Plan Note (Signed)
History of mild ascending thoracic aortic dilatation with a measurement of 40 mm .  This will be repeated on an annual basis.

## 2021-08-24 NOTE — Assessment & Plan Note (Signed)
She of PAF maintaining sinus rhythm on Eliquis oral anticoagulation.

## 2021-08-24 NOTE — Progress Notes (Signed)
08/24/2021 Matthew Chen   1956-02-13  [id number]  Primary Physician Pickard, Cammie Mcgee, MD Primary Cardiologist: Lorretta Harp MD Garret Reddish, [place], [place]  HPI:  Matthew Chen is a 66 y.o.  mild to moderately overweight married Caucasian male father of 4 children, grandfather of a grandchildren was referred by Dr. Skeet Latch for evaluation and treatment of claudication. I last saw him in the office   02/28/2021.  He  has a history of treated hypertension, diabetes and hyperlipidemia. 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 He's never had a heart attack or stroke and denies chest pain or shortness of breath. He does complain of lifestyle limiting claudication and had arterial Doppler studies performed in our office 05/29/15 revealing high-grade right popliteal and distal left SFA disease. Based on this, we decided to proceed with angiography and potential percutaneous revascularization. I initially angiogrammed him on 06/29/15 and performed diamondback orbital rotational atherectomy, drug eluting balloon angioplasty of the distal left SFA for a high-grade, subocclusive calcific/exophytic plaque. He had a high-grade calcific/exophytic subocclusive right popliteal artery plaque for which he underwent diamondback orbital rotation arthrectomy, drug eluding balloon angioplasty on 07/13/15 with an excellent angiographic result. Since that time his claudication has resolved. His Dopplers performed  03/17/2018 revealed normal ABIs bilaterally with the development of a high-frequency signal in his right popliteal artery.  Since I saw him 8 months ago he has developed lifestyle limiting right calf claudication.   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  He had 30 to 40% mid left SFA stenosis.  I performed diamondback orbital rotational atherectomy, PTA and stenting using a Tigris 7 mm  x 40 mm long self-expanding stent.  His Dopplers normalized and his claudication has resolved.    His most recent Dopplers performed 11/13/2020 revealed normal ABIs bilaterally with a patent right SFA stent.  When I saw him 3 weeks ago he was complaining of exertional chest pain and dyspnea over the last several months.  I did obtain a 2D echo that was unremarkable with an EF of 60 to 65% with mild MR and mild AAS.  He did have a 46 mm ascending thoracic aortic aneurysm.  Myoview stress test was nonischemic.  A chest CT did show a 7.1 cm mass in his liver and a MRI was suggested.  He is wearing a event monitor currently.  Based on his symptoms we decided to proceed with outpatient diagnostic coronary angiography.  I performed coronary angiography on him 02/08/2021 revealing a high-grade subtotally occluded ostial calcified first diagonal branch stenosis.  Because of chronic renal insufficiency I elected to stage his intervention for 1 week later but did begin him on Plavix.  On 02/15/2021 I attempted to intervene on his diagonal branch but unfortunately this was complicated by coronary perforation using a "Fielder XT wire".  Luckily, he did not develop a pericardial effusion and this was quickly addressed during the procedure.  He was discharged home 2 days later.  He was found to have PAF during the hospitalization which was confirmed by event monitoring subsequent to that.  Since beginning him on antianginal medications his effort angina has significantly improved.  Since I saw him 6 months ago he continues to do well.  He remains in sinus rhythm.  He denies chest pain, shortness of breath or claudication.  He did have a 2D echo  performed 03/13/2021 revealing normal LV systolic function, grade 2 diastolic dysfunction with mild aortic stenosis and mildly dilated ascending thoracic aorta.  Lower extremity arterial Doppler studies performed 11/13/2020 revealed normal ABIs bilaterally with patent SFA and popliteal  arteries.   Current Meds  Medication Sig   acetaminophen (TYLENOL) 500 MG tablet Take 1,000 mg by mouth every 6 (six) hours as needed for moderate pain or headache.   amLODipine (NORVASC) 5 MG tablet Take 1 tablet (5 mg total) by mouth daily.   apixaban (ELIQUIS) 5 MG TABS tablet Take 1 tablet (5 mg total) by mouth 2 (two) times daily.   aspirin EC 81 MG tablet Take 1 tablet (81 mg total) by mouth daily.   atorvastatin (LIPITOR) 80 MG tablet TAKE 1 TABLET BY MOUTH EVERY DAY   B-D UF III MINI PEN NEEDLES 31G X 5 MM MISC USE DAILY WITH VICTOZA   carvedilol (COREG) 12.5 MG tablet Take 3 tablets (37.5 mg total) by mouth 2 (two) times daily.   Continuous Blood Gluc Sensor (FREESTYLE LIBRE 2 SENSOR) MISC CHECK BLOOD SUGAR FOUR TIMES DAILY   empagliflozin (JARDIANCE) 25 MG TABS tablet Take 1 tablet (25 mg total) by mouth daily before breakfast.   fenofibrate 160 MG tablet Take 1 tablet (160 mg total) by mouth daily.   glipiZIDE (GLUCOTROL XL) 5 MG 24 hr tablet TAKE 1 TABLET BY MOUTH EVERY DAY WITH BREAKFAST   glucose blood (ONETOUCH ULTRA) test strip CHECK BLOOD SUGAR TWICE A DAY   liraglutide (VICTOZA) 18 MG/3ML SOPN INJECT 1.2 MG UNDER THE SKIN ONCE DAILY   lisinopril-hydrochlorothiazide (ZESTORETIC) 20-25 MG tablet Take 1 tablet by mouth daily.   Omega-3 Fatty Acids (FISH OIL) 1000 MG CAPS Take 1,000 mg by mouth 2 (two) times daily.   pantoprazole (PROTONIX) 40 MG tablet Take 1 tablet (40 mg total) by mouth 2 (two) times daily.   pioglitazone (ACTOS) 30 MG tablet TAKE 1 TABLET BY MOUTH EVERY DAY STOP TRADJENTA   vedolizumab (ENTYVIO) 300 MG injection Inject 300 mg as directed See admin instructions. Every 2 months     Allergies  Allergen Reactions   Penicillins Other (See Comments)    Unsure, childhood reaction Has patient had a PCN reaction causing immediate rash, facial/tongue/throat swelling, SOB or lightheadedness with hypotension: Unknown Has patient had a PCN reaction causing severe  rash involving mucus membranes or skin necrosis: Unknown Has patient had a PCN reaction that required hospitalization: No Has patient had a PCN reaction occurring within the last 10 years: No If all of the above answers are "NO", then may proceed with Cephalosporin use.     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[employer], academic  Tobacco Use   Smoking status: Former    Packs/day: 1.50    Years: 25.00    Pack years: 37.50    Types: Cigarettes    Quit date: 08/12/1996    Years since quitting: 25.0   Smokeless tobacco: Never  Vaping Use   Vaping Use: Never used  Substance and Sexual Activity   Alcohol use: Yes    Alcohol/week: 1.0 standard drink    Types: 1 Cans of beer per week    Comment: social use   Drug use:  No   Sexual activity: Yes  Other Topics Concern   Not on file  Social History Narrative   ** Merged History Encounter **       Social Determinants of Health   Financial Resource Strain: Low Risk    Difficulty of Paying Living Expenses: Not very hard  Food Insecurity: Not on file  Transportation Needs: Not on file  Physical Activity: Not on file  Stress: Not on file  Social Connections: Not on file  Intimate Partner Violence: Not on file     Review of Systems: General: negative for chills, fever, night sweats or weight changes.  Cardiovascular: negative for chest pain, dyspnea on exertion, edema, orthopnea, palpitations, paroxysmal nocturnal dyspnea or shortness of breath Dermatological: negative for rash Respiratory: negative for cough or wheezing Urologic: negative for hematuria Abdominal: negative for nausea, vomiting, diarrhea, bright red blood per rectum, melena, or hematemesis Neurologic: negative for visual changes, syncope, or  dizziness All other systems reviewed and are otherwise negative except as noted above.    Blood pressure 112/66, pulse (!) 57, height 5' 10"  (1.778 m), weight 235 lb (106.6 kg), SpO2 96 %.  General appearance: alert and no distress Neck: no adenopathy, no carotid bruit, no JVD, supple, symmetrical, trachea midline, and thyroid not enlarged, symmetric, no tenderness/mass/nodules Lungs: clear to auscultation bilaterally Heart: regular rate and rhythm, S1, S2 normal, no murmur, click, rub or gallop Extremities: extremities normal, atraumatic, no cyanosis or edema Pulses: 2+ and symmetric Skin: Skin color, texture, turgor normal. No rashes or lesions Neurologic: Grossly normal  EKG sinus bradycardia 57 without ST or T wave changes.  I personally reviewed this EKG.  ASSESSMENT AND PLAN:   Hypertension History of essential hypertension a blood pressure measured today 112/66.  He is on amlodipine, carvedilol, lisinopril and hydrochlorothiazide.  Peripheral arterial disease (HCC) History of peripheral arterial disease status post multiple interventions on his SFA and popliteal arteries beginning 05/29/2015 when I demonstrated high-grade right popliteal artery stenosis and distal left SFA disease.  I angiogram to him 06/29/2015 and performed diamondback orbital rotational atherectomy, drug-eluting balloon angioplasty of the distal left SFA for subocclusive calcific/exophytic plaque.  He had a high-grade calcific exophytic subocclusive right popliteal artery plaque and underwent staged intervention using similar techniques 07/13/2015.  Artery angiogram 10 04/27/2018 revealing a 95% calcified right popliteal artery stenosis with three-vessel runoff.  I performed orbital atherectomy, PTA and stenting using a Tiger S 7 mm x 40 mm long self-expanding stent.  He has done well since.  He currently denies claudication.  His most recent Doppler studies performed 11/13/2020 revealed normal ABIs bilaterally with  patent SFA and popliteal arteries.  These will be repeated on an annual basis.  Hyperlipidemia History of hyperlipidemia on high-dose statin therapy with lipid profile performed 03/02/2021 revealing total cholesterol 133, LDL of 68 and HDL of 30.  Bicuspid aortic valve History of bicuspid aortic valve with mild aortic stenosis by 2D echo recently checked 03/13/2021.  This will be repeated on an annual basis.  Aortic aneurysm, thoracic History of mild ascending thoracic aortic dilatation with a measurement of 40 mm .  This will be repeated on an annual basis.  CAD (coronary artery disease) History of CAD status postcardiac catheterization which I performed 6 flex 30/22 revealing a high-grade subtotally occluded ostial calcified first diagonal branch stenosis.  Because of chronic renal insufficiency elected to stage him and 1 week later I attempted to perform orbital atherectomy.  Unfortunately I perforated his coronary artery with  a "Fielder XT wire".  Luckily, he did not develop pericardial tamponade and he was discharged home 2 weeks later.  After adjusting his antianginal medication he currently denies chest pain or shortness of breath.  Paroxysmal atrial fibrillation (HCC) She of PAF maintaining sinus rhythm on Eliquis oral anticoagulation.     Lorretta Harp MD FACP,FACC,FAHA, [facility] 08/24/2021 8:53 AM

## 2021-08-28 ENCOUNTER — Encounter: Payer: Self-pay | Admitting: Gastroenterology

## 2021-08-29 ENCOUNTER — Other Ambulatory Visit: Payer: Self-pay | Admitting: Interventional Radiology

## 2021-08-29 DIAGNOSIS — C220 Liver cell carcinoma: Secondary | ICD-10-CM

## 2021-09-03 ENCOUNTER — Other Ambulatory Visit: Payer: Self-pay

## 2021-09-03 ENCOUNTER — Ambulatory Visit (HOSPITAL_COMMUNITY)
Admission: RE | Admit: 2021-09-03 | Discharge: 2021-09-03 | Disposition: A | Discharge status: Home / Self Care | Payer: HMO | Source: Ambulatory Visit | Attending: Interventional Radiology | Admitting: Interventional Radiology

## 2021-09-03 DIAGNOSIS — C220 Liver cell carcinoma: Secondary | ICD-10-CM | POA: Insufficient documentation

## 2021-09-03 DIAGNOSIS — R160 Hepatomegaly, not elsewhere classified: Secondary | ICD-10-CM | POA: Diagnosis not present

## 2021-09-03 DIAGNOSIS — K7689 Other specified diseases of liver: Secondary | ICD-10-CM | POA: Diagnosis not present

## 2021-09-03 MED ORDER — GADOBUTROL 1 MMOL/ML IV SOLN
10.0000 mL | Freq: Once | INTRAVENOUS | Status: AC | PRN
  Administered 2021-09-03: 10 mL via INTRAVENOUS

## 2021-09-05 ENCOUNTER — Other Ambulatory Visit: Payer: Self-pay | Admitting: Family Medicine

## 2021-09-05 ENCOUNTER — Encounter: Payer: Self-pay | Admitting: *Deleted

## 2021-09-05 ENCOUNTER — Other Ambulatory Visit: Payer: Self-pay | Admitting: Gastroenterology

## 2021-09-05 ENCOUNTER — Other Ambulatory Visit: Payer: Self-pay

## 2021-09-05 ENCOUNTER — Ambulatory Visit
Admission: RE | Admit: 2021-09-05 | Discharge: 2021-09-05 | Disposition: A | Discharge status: Home / Self Care | Payer: HMO | Source: Ambulatory Visit | Attending: Interventional Radiology | Admitting: Interventional Radiology

## 2021-09-05 DIAGNOSIS — L570 Actinic keratosis: Secondary | ICD-10-CM | POA: Diagnosis not present

## 2021-09-05 DIAGNOSIS — C220 Liver cell carcinoma: Secondary | ICD-10-CM

## 2021-09-05 DIAGNOSIS — Z9889 Other specified postprocedural states: Secondary | ICD-10-CM | POA: Diagnosis not present

## 2021-09-05 DIAGNOSIS — E785 Hyperlipidemia, unspecified: Secondary | ICD-10-CM

## 2021-09-05 HISTORY — PX: IR RADIOLOGIST EVAL & MGMT: IMG5224

## 2021-09-05 NOTE — Progress Notes (Signed)
Referring Physician(s): Stark Klein, MD   Reason for follow up: Follow up after TARE, telephone virtual visit   History of present illness: 66 year old male with 7 cm unifocal right hepatocellular carcinoma in the absence of significant liver disease (Childs Pugh A, ALBI Grade 1).  Given size, hepatectomy is currently not indicated.  Given location and otherwise healthy liver, he was deemed a candidate for locoregional therapy with radiation segmentectomy.   Split dose TARE delivered via segment 6 and 7 branches to the right hepatic mass without complications on 82/95/62.   He continues to do well since the procedure.  Denies nausea, vomiting, change in bowel habits, fevers/chills, and pain. He underwent MRI abdomen on 09/03/21.   Past Medical History:  Diagnosis Date   Adenomatous colon polyp 03/2008   Anginal pain ([place])    Aortic aneurysm, thoracic 09/09/2016   4.4 cm by echo 05/2015   Arthritis    "joints in my fingers ache" (07/13/2015)   Bicuspid aortic valve 09/09/2016   Cataract 2019   bilateral eyes   CKD (chronic kidney disease), stage III ([place])    Coronary artery disease    Diabetes mellitus without complication ([place])    Hemorrhoids    Hepatocellular carcinoma (HCC)    Hyperlipidemia    Hypertension    Paroxysmal atrial fibrillation ([place])    Peripheral arterial disease ([place])    a. dopppler 05/29/2015 revealed high-grade right popliteal and distal left SFA disease b. LE angio 06/28/2015 revealing high grade calcific dx with distal L SFA and R popliteal artery s/p diamondback orbital rotational atherectomy, PTA of L SFA  c. 07/13/2015 diamondback orbital rotational atherectomy and drug eluting balloon angioplasty of R popliteal artery (P2 segment) using distal protection    Type II diabetes mellitus ([place])    Ulcerative colitis     Past Surgical History:  Procedure Laterality Date   ABDOMINAL AORTOGRAM W/LOWER EXTREMITY N/A 04/27/2018   Procedure: ABDOMINAL AORTOGRAM  W/LOWER EXTREMITY;  Surgeon: Lorretta Harp, MD;  Location: [facility];  Service: Cardiovascular;  Laterality: N/A;   COLONOSCOPY     COLONOSCOPY W/ POLYPECTOMY  X 4-5   CORONARY ATHERECTOMY N/A 02/15/2021   Procedure: CORONARY ATHERECTOMY;  Surgeon: Lorretta Harp, MD;  Location: [facility];  Service: Cardiovascular;  Laterality: N/A;  aborted    CORONARY BALLOON ANGIOPLASTY N/A 02/15/2021   Procedure: CORONARY BALLOON ANGIOPLASTY;  Surgeon: Lorretta Harp, MD;  Location: [facility];  Service: Cardiovascular;  Laterality: N/A;   COSMETIC SURGERY  < 2000   "removed birthmark from top of my head"   IR ANGIOGRAM SELECTIVE EACH ADDITIONAL VESSEL  06/08/2021   IR ANGIOGRAM SELECTIVE EACH ADDITIONAL VESSEL  06/08/2021   IR ANGIOGRAM SELECTIVE EACH ADDITIONAL VESSEL  06/08/2021   IR ANGIOGRAM SELECTIVE EACH ADDITIONAL VESSEL  06/08/2021   IR ANGIOGRAM SELECTIVE EACH ADDITIONAL VESSEL  06/21/2021   IR ANGIOGRAM SELECTIVE EACH ADDITIONAL VESSEL  06/21/2021   IR ANGIOGRAM SELECTIVE EACH ADDITIONAL VESSEL  06/21/2021   IR ANGIOGRAM SELECTIVE EACH ADDITIONAL VESSEL  06/21/2021   IR ANGIOGRAM VISCERAL SELECTIVE  06/08/2021   IR ANGIOGRAM VISCERAL SELECTIVE  06/21/2021   IR EMBO ARTERIAL NOT HEMORR HEMANG INC GUIDE ROADMAPPING  06/08/2021   IR EMBO TUMOR ORGAN ISCHEMIA INFARCT INC GUIDE ROADMAPPING  06/21/2021   IR EMBO TUMOR ORGAN ISCHEMIA INFARCT INC GUIDE ROADMAPPING  06/21/2021   IR RADIOLOGIST EVAL & MGMT  05/08/2021   IR RADIOLOGIST EVAL & MGMT  07/26/2021   IR US GUIDE VASC ACCESS LEFT  06/21/2021   IR US GUIDE VASC ACCESS RIGHT  06/08/2021   LEFT HEART CATH AND CORONARY ANGIOGRAPHY N/A 02/08/2021   Procedure: LEFT HEART CATH AND CORONARY ANGIOGRAPHY;  Surgeon: Lorretta Harp, MD;  Location: [facility];  Service: Cardiovascular;  Laterality: N/A;   PERIPHERAL VASCULAR ATHERECTOMY  04/27/2018   Procedure: PERIPHERAL VASCULAR ATHERECTOMY;  Surgeon:  Lorretta Harp, MD;  Location: [facility];  Service: Cardiovascular;;  right popliteal artery   PERIPHERAL VASCULAR CATHETERIZATION N/A 06/29/2015   Procedure: Lower Extremity Angiography;  Surgeon: Lorretta Harp, MD;  Location: [facility];  Service: Cardiovascular;  Laterality: N/A;   PERIPHERAL VASCULAR CATHETERIZATION N/A 07/13/2015   Procedure: Lower Extremity Angiography;  Surgeon: Lorretta Harp, MD;  Location: [facility];  Service: Cardiovascular;  Laterality: N/A;   PERIPHERAL VASCULAR CATHETERIZATION  07/13/2015   Procedure: Peripheral Vascular Atherectomy;  Surgeon: Lorretta Harp, MD;  Location: [facility];  Service: Cardiovascular;;   PERIPHERAL VASCULAR INTERVENTION  04/27/2018   Procedure: PERIPHERAL VASCULAR INTERVENTION;  Surgeon: Lorretta Harp, MD;  Location: [facility];  Service: Cardiovascular;;  right popliteal artery   POLYPECTOMY     TONSILLECTOMY     UPPER GASTROINTESTINAL ENDOSCOPY      Allergies: Penicillins  Medications: Prior to Admission medications   Medication Sig Start Date End Date Taking? Authorizing Provider  acetaminophen (TYLENOL) 500 MG tablet Take 1,000 mg by mouth every 6 (six) hours as needed for moderate pain or headache.    [provider]  amLODipine (NORVASC) 5 MG tablet Take 1 tablet (5 mg total) by mouth daily. 03/19/21   Susy Frizzle, MD  apixaban (ELIQUIS) 5 MG TABS tablet Take 1 tablet (5 mg total) by mouth 2 (two) times daily. 03/19/21   Susy Frizzle, MD  aspirin EC 81 MG tablet Take 1 tablet (81 mg total) by mouth daily. 06/01/15   Skeet Latch, MD  atorvastatin (LIPITOR) 80 MG tablet TAKE 1 TABLET BY MOUTH EVERY DAY 08/09/21   Lorretta Harp, MD  B-D UF III MINI PEN NEEDLES 31G X 5 MM MISC USE DAILY WITH VICTOZA 06/20/21   Susy Frizzle, MD  carvedilol (COREG) 12.5 MG tablet Take 3 tablets (37.5 mg total) by mouth 2 (two) times daily. 07/31/21   Susy Frizzle, MD  Continuous Blood Gluc Sensor (FREESTYLE LIBRE 2 SENSOR) MISC CHECK BLOOD SUGAR FOUR TIMES DAILY 03/28/21   Susy Frizzle, MD  empagliflozin (JARDIANCE) 25 MG TABS tablet Take 1 tablet (25 mg total) by mouth daily before breakfast. 03/19/21   Susy Frizzle, MD  fenofibrate 160 MG tablet Take 1 tablet (160 mg total) by mouth daily. 03/19/21   Susy Frizzle, MD  glipiZIDE (GLUCOTROL XL) 5 MG 24 hr tablet TAKE 1 TABLET BY MOUTH EVERY DAY WITH BREAKFAST 03/28/21   Susy Frizzle, MD  glucose blood (ONETOUCH ULTRA) test strip CHECK BLOOD SUGAR TWICE A DAY 03/19/21   Susy Frizzle, MD  glycopyrrolate (ROBINUL) 1 MG tablet Take 1 tablet (1 mg total) by mouth 2 (two) times daily as needed. Patient not taking: Reported on 08/24/2021 06/20/21   [name], MD  liraglutide (VICTOZA) 18 MG/3ML SOPN INJECT 1.2 MG UNDER THE SKIN ONCE DAILY 03/19/21   Susy Frizzle, MD  lisinopril-hydrochlorothiazide (ZESTORETIC) 20-25 MG tablet Take 1 tablet by mouth daily. 03/19/21  Susy Frizzle, MD  loperamide (IMODIUM A-D) 2 MG tablet Take 4 mg by mouth 4 (four) times daily as needed for diarrhea or loose stools. Patient not taking: Reported on 08/24/2021    [provider]  Omega-3 Fatty Acids (FISH OIL) 1000 MG CAPS Take 1,000 mg by mouth 2 (two) times daily.    [provider]  ondansetron (ZOFRAN ODT) 4 MG disintegrating tablet Take 1 tablet (4 mg total) by mouth every 8 (eight) hours as needed for nausea or vomiting. Patient not taking: Reported on 08/24/2021 06/21/21   Tyson Alias, NP  pantoprazole (PROTONIX) 40 MG tablet Take 1 tablet (40 mg total) by mouth 2 (two) times daily. 03/19/21   Susy Frizzle, MD  pioglitazone (ACTOS) 30 MG tablet TAKE 1 TABLET BY MOUTH EVERY DAY STOP TRADJENTA 03/19/21   Susy Frizzle, MD  predniSONE (DELTASONE) 10 MG tablet Take 4 (40 mg) tablets by mouth daily x 1 week, then reduce to 3 (30 mg) tablets by mouth daily x 1 week, then reduce to  2 (20 mg) tablets by mouth daily and stay on this dose until colonoscopy. Patient not taking: Reported on 07/11/2021 06/20/21   [name], MD  vedolizumab (ENTYVIO) 300 MG injection Inject 300 mg as directed See admin instructions. Every 2 months    [provider]     Family History  Problem Relation Age of Onset   Colon cancer Maternal Grandmother        in her 62's   Colon polyps Mother        in her 49's   Diabetes Mother    Heart disease Father    Diabetes Maternal Aunt        Aunts and Uncles   Esophageal cancer Neg Hx    Stomach cancer Neg Hx    Rectal cancer Neg Hx     Social History   Socioeconomic History   Marital status: Married    Spouse name: Eisa Necaise   Number of children: 2   Years of education: Not on file   Highest education level: Not on file  Occupational History   Occupation: Truck Education administrator: Counsellor of Librarian, academic  Tobacco Use   Smoking status: Former    Packs/day: 1.50    Years: 25.00    Pack years: 37.50    Types: Cigarettes    Quit date: 08/12/1996    Years since quitting: 25.0   Smokeless tobacco: Never  Vaping Use   Vaping Use: Never used  Substance and Sexual Activity   Alcohol use: Yes    Alcohol/week: 1.0 standard drink    Types: 1 Cans of beer per week    Comment: social use   Drug use: No   Sexual activity: Yes  Other Topics Concern   Not on file  Social History Narrative   ** Merged History Encounter **       Social Determinants of Health   Financial Resource Strain: Low Risk    Difficulty of Paying Living Expenses: Not very hard  Food Insecurity: Not on file  Transportation Needs: Not on file  Physical Activity: Not on file  Stress: Not on file  Social Connections: Not on file     Vital Signs: There were no vitals taken for this visit.  No physical examination was performed in lieu of virtual telephone clinic visit.   Imaging: MR ABDOMEN WWO CONTRAST  Result Date:  09/03/2021 CLINICAL DATA:  Patient status post radioembolization single large hepatocellular mass in the RIGHT hepatic lobe. Radio segmentectomy (Yttrium 90 microspheres) 06/21/2021. EXAM: MRI ABDOMEN WITHOUT AND WITH CONTRAST TECHNIQUE: Multiplanar multisequence MR imaging of the abdomen was performed both before and after the administration of intravenous contrast. CONTRAST:  32m GADAVIST GADOBUTROL 1 MMOL/ML IV SOLN COMPARISON:  MRI 03/22/2021 FINDINGS: Lower chest:  Lung bases are clear. Hepatobiliary: Enhancing mass again demonstrated in the RIGHT hepatic lobe. Measured at the same level lesion measures 7.2 x 4.4 cm compared to 7.0 x 5.1 cm on the early arterial phase postcontrast imaging (image 59/series 19). There is a focus of hypoenhancement along the medial margin of the lesion. More inferiorly there is a poorly defined region of new increased enhancement (image 63/19, for example). Enhancement superior to the dominant lesion (image 41/19) is similar pattern. Within LEFT hepatic lobe there is a new 11 mm enhancing lesion (image 36/19). Pancreas: Normal pancreatic parenchymal intensity. No ductal dilatation or inflammation. Spleen: Normal spleen. Adrenals/urinary tract: Adrenal glands and kidneys are normal. Stomach/Bowel: Stomach and limited of the small bowel is unremarkable Vascular/Lymphatic: Abdominal aortic normal caliber. No retroperitoneal periportal lymphadenopathy. Musculoskeletal: No aggressive osseous lesion IMPRESSION: 1. Similar size with change in enhancement pattern of early enhancing lesion in the RIGHT hepatic lobe following radiosegmentectomy. Differential includes post radiation effect versus residual active carcinoma. 2. Increase in ill-defined enhancement in the inferior RIGHT hepatic lobe is concerning for new hepatocellular carcinoma versus treatment effect. 3. Clearly new single small enhancing lesion in the LEFT hepatic lobe concerning for new multifocal hepatocellular carcinoma.  Electronically Signed   By: SSuzy BouchardM.D.   On: 09/03/2021 09:09    Labs:  CBC: Recent Labs    04/17/21 1401 04/24/21 0610 06/08/21 0800 06/21/21 0736  WBC 5.0 5.2 5.3 7.3  HGB 11.5* 11.6* 11.6* 11.6*  HCT 35.8* 36.7* 36.9* 36.8*  PLT 290.0 278 279 299    COAGS: Recent Labs    04/24/21 0610 06/08/21 0800 06/21/21 0736  INR 1.0 1.1 1.0    BMP: Recent Labs    11/20/20 0830 01/17/21 1454 02/15/21 0606 02/16/21 0055 03/02/21 0807 04/17/21 1401 06/08/21 0800 06/21/21 0736  NA 140   < > 140 136 139 139 139 139  K 4.7   < > 4.2 4.4 4.3 4.1 4.2 4.3  CL 104   < > 106 103 104 107 109 111  CO2 25   < > 27 26 27 23 23  18*  GLUCOSE 148*   < > 194* 173* 174* 118* 143* 118*  BUN 25   < > 26* 23 29* 26* 29* 31*  CALCIUM 9.4   < > 9.1 8.7* 9.5 9.2 8.9 9.4  CREATININE 1.67*   < > 1.88* 1.79* 2.03* 1.98* 2.02* 1.79*  GFRNONAA 42*  --  39* 42*  --   --  36* 42*  GFRAA 49*  --   --   --   --   --   --   --    < > = values in this interval not displayed.    LIVER FUNCTION TESTS: Recent Labs    03/02/21 0807 04/17/21 1401 06/08/21 0800 06/21/21 0736  BILITOT 0.5 0.4 0.6 0.5  AST 24 22 32 24  ALT 28 24 28 26   ALKPHOS  --  57 78 55  PROT 6.8 7.2 7.3 7.6  ALBUMIN  --  4.0 3.7 3.8    Assessment and Plan: 66year old male without significant underlying liver  disease Ardine Eng A, ALBI Grade 1) with biopsy proven 7 cm right hepatocellular carcinoma status post transarterial radioembolization with split dose segmentectomy on 06/21/21.  He is recovering very well after the procedure.    Initial follow up MRI confounded by post-treatment changes.  New 1.1 cm LIRADS 3 lesion in left lobe.   Follow up in 3 months with repeat MRI abdomen, CBC, CMP, INR, and a-FP.   Electronically Signed: Suzette Battiest 09/05/2021, 2:42 PM   I spent a total of 25 Minutes in telephone clinical consultation, greater than 50% of which was counseling/coordinating care for hepatocellular  carcinoma.

## 2021-09-06 ENCOUNTER — Other Ambulatory Visit: Payer: Self-pay

## 2021-09-06 MED ORDER — GLIPIZIDE ER 5 MG PO TB24: ORAL_TABLET | ORAL | 3 refills | Status: DC

## 2021-09-06 NOTE — Telephone Encounter (Signed)
Received electronic refill request from pharmacy for prednisone taper that was prescribed at last office appt. Patient was to remain on prednisone 20 mg until colonoscopy. Dr. Fuller Plan, what dose is patient suppose to be on now? I could not see anything documented on colon report. Thanks

## 2021-09-06 NOTE — Telephone Encounter (Signed)
Patient states he has not been on prednisone since his colonoscopy when Dr. Fuller Plan told him to discontinue. Informed patient the refill request was probably a automatic request from the pharmacy. Patient states he has been doing very well. Patient states his diarrhea has resolved and he no longer needs the appt.

## 2021-09-07 ENCOUNTER — Telehealth: Payer: Self-pay | Admitting: Gastroenterology

## 2021-09-07 NOTE — Telephone Encounter (Signed)
Patient called states he has spoken to Dr. Fuller Plan about his infusion medication costing him to much with his current insurance. He is calling to follow up on that because is states not being able to pay over $1000.00 every month.

## 2021-09-10 NOTE — Telephone Encounter (Signed)
Patient is not able to afford the $1300 out of pocket cost for Entyvio infusion every 2 months.  I have mailed him an application for [facility] assistance.  He will fill out and bring back to me and I will fax.  He understands, he may not qualify, but we will see before making change to drug.

## 2021-09-11 DIAGNOSIS — I25118 Atherosclerotic heart disease of native coronary artery with other forms of angina pectoris: Secondary | ICD-10-CM | POA: Diagnosis not present

## 2021-09-11 DIAGNOSIS — I480 Paroxysmal atrial fibrillation: Secondary | ICD-10-CM | POA: Diagnosis not present

## 2021-09-11 DIAGNOSIS — E7800 Pure hypercholesterolemia, unspecified: Secondary | ICD-10-CM | POA: Diagnosis not present

## 2021-09-11 DIAGNOSIS — I10 Essential (primary) hypertension: Secondary | ICD-10-CM

## 2021-09-11 DIAGNOSIS — Z794 Long term (current) use of insulin: Secondary | ICD-10-CM | POA: Diagnosis not present

## 2021-09-11 DIAGNOSIS — E118 Type 2 diabetes mellitus with unspecified complications: Secondary | ICD-10-CM

## 2021-09-25 DIAGNOSIS — K5190 Ulcerative colitis, unspecified, without complications: Secondary | ICD-10-CM | POA: Diagnosis not present

## 2021-10-03 ENCOUNTER — Telehealth: Payer: Self-pay

## 2021-10-03 DIAGNOSIS — I10 Essential (primary) hypertension: Secondary | ICD-10-CM

## 2021-10-03 MED ORDER — APIXABAN 5 MG PO TABS: 5.0000 mg | ORAL_TABLET | Freq: Two times a day (BID) | ORAL | 1 refills | Status: DC

## 2021-10-03 NOTE — Telephone Encounter (Signed)
Rx sent to pharmacy   

## 2021-10-03 NOTE — Telephone Encounter (Signed)
Refill request for apixaban (ELIQUIS) 5 MG TABS tablet.

## 2021-10-15 ENCOUNTER — Other Ambulatory Visit: Payer: Self-pay | Admitting: Family Medicine

## 2021-10-15 ENCOUNTER — Other Ambulatory Visit: Payer: Self-pay | Admitting: Gastroenterology

## 2021-10-18 ENCOUNTER — Telehealth: Payer: Self-pay

## 2021-10-18 NOTE — Telephone Encounter (Signed)
[employer] called & stated paperwork is missing a date on the signature line. Paperwork is being faxed back to [place] in order to complete the process. ?

## 2021-10-18 NOTE — Telephone Encounter (Signed)
Faxed paperwork to [employer].  ?

## 2021-10-19 NOTE — Telephone Encounter (Signed)
Spoke with patient regarding Entiv[employer] paperwork. He is aware that I faxed over paperwork yesterday, and they are processing it. ?

## 2021-10-19 NOTE — Telephone Encounter (Signed)
Patient called and stated he would like a call to discuss Entivio. Please advise.  ?

## 2021-10-26 DIAGNOSIS — U071 COVID-19: Secondary | ICD-10-CM | POA: Diagnosis not present

## 2021-10-26 DIAGNOSIS — E1165 Type 2 diabetes mellitus with hyperglycemia: Secondary | ICD-10-CM | POA: Diagnosis not present

## 2021-10-26 DIAGNOSIS — C220 Liver cell carcinoma: Secondary | ICD-10-CM | POA: Diagnosis not present

## 2021-10-29 NOTE — Telephone Encounter (Signed)
Inbound call from pharmacy requesting a call back to get clarification on Entivio order please.  Can be reached at [phone]. ?

## 2021-10-29 NOTE — Telephone Encounter (Signed)
Spoke with pharmacist with [facility] and confirmed patient identification & maintenance dosing. Medication is scheduled to arrive 3/22 at [facility]. ?

## 2021-11-07 ENCOUNTER — Telehealth: Payer: Self-pay

## 2021-11-07 NOTE — Telephone Encounter (Signed)
Pt called and would like to know if there is another medications he can take instead of the Eliquis ? due to the high cost.  ? ?Also ask about other types of pen needles as well due to high cost?  ? ?Please advice ?

## 2021-11-08 NOTE — Telephone Encounter (Signed)
Pt will stay with Eliquis for now. Pt also ask about other types of pen needles instead of Victoza? due to high cost?  ?

## 2021-11-09 NOTE — Telephone Encounter (Signed)
Call left msg for pt to call back ?

## 2021-11-14 NOTE — Telephone Encounter (Signed)
Spoke with pt, voiced understanding, nothing at this time ?

## 2021-11-21 ENCOUNTER — Other Ambulatory Visit (HOSPITAL_COMMUNITY): Payer: Self-pay | Admitting: Cardiovascular Disease

## 2021-11-21 DIAGNOSIS — I739 Peripheral vascular disease, unspecified: Secondary | ICD-10-CM

## 2021-11-21 DIAGNOSIS — Z9862 Peripheral vascular angioplasty status: Secondary | ICD-10-CM

## 2021-11-21 DIAGNOSIS — Z959 Presence of cardiac and vascular implant and graft, unspecified: Secondary | ICD-10-CM

## 2021-11-22 ENCOUNTER — Other Ambulatory Visit: Payer: Self-pay | Admitting: General Surgery

## 2021-11-22 ENCOUNTER — Other Ambulatory Visit: Payer: Self-pay

## 2021-11-22 ENCOUNTER — Other Ambulatory Visit: Payer: Self-pay | Admitting: Interventional Radiology

## 2021-11-22 DIAGNOSIS — C220 Liver cell carcinoma: Secondary | ICD-10-CM

## 2021-11-26 DIAGNOSIS — K5190 Ulcerative colitis, unspecified, without complications: Secondary | ICD-10-CM | POA: Diagnosis not present

## 2021-11-26 DIAGNOSIS — Z5111 Encounter for antineoplastic chemotherapy: Secondary | ICD-10-CM | POA: Diagnosis not present

## 2021-11-29 ENCOUNTER — Other Ambulatory Visit: Payer: Self-pay | Admitting: General Surgery

## 2021-11-29 DIAGNOSIS — C220 Liver cell carcinoma: Secondary | ICD-10-CM

## 2021-12-03 ENCOUNTER — Ambulatory Visit (HOSPITAL_COMMUNITY): Admission: RE | Admit: 2021-12-03 | Payer: HMO | Source: Ambulatory Visit

## 2021-12-04 ENCOUNTER — Telehealth: Payer: Self-pay | Admitting: Cardiovascular Disease

## 2021-12-04 ENCOUNTER — Ambulatory Visit (HOSPITAL_COMMUNITY): Payer: HMO | Admitting: Physician Assistant

## 2021-12-04 NOTE — Telephone Encounter (Signed)
Pt c/o Shortness Of Breath: STAT if SOB developed within the last 24 hours or pt is noticeably SOB on the phone ? ?1. Are you currently SOB (can you hear that pt is SOB on the phone)? no, not at this time ? ?2. How long have you been experiencing SOB? A little- but seems to be getting worse ? ?3. Are you SOB when sitting or when up moving around? Moving around ? ?4. Are you currently experiencing any other symptoms? Weakness and no energy-wanted an appointed- made appointment for 12-10-21 with Urban Gibson- please call to evaluate ? ?

## 2021-12-04 NOTE — Telephone Encounter (Signed)
Spoke with the patient who reports that he has noticed some worsening shortness of breath for the past several weeks. He states that yesterday he was out working in the yard with his wife when he really gave out. He has been more fatigued and weak recently as well. He denies any shortness of breath at rest. States that he is just noticing it more with minimal exertion. He denies any chest pain. Denies lightheadedness or dizziness. He states that he has put on some weight but feels that he is not retaining fluid, just typical weight gain. Denies any swelling. Reports blood pressures have been stable, he does not take it often but last week reading was 110/60s. Patient states that he would prefer to see [name]. Advised to keep appointment with Laurann Montana, NP on 5/1 and I would make [name] and his nurse aware. ?

## 2021-12-05 ENCOUNTER — Ambulatory Visit (INDEPENDENT_AMBULATORY_CARE_PROVIDER_SITE_OTHER): Payer: HMO | Admitting: Physician Assistant

## 2021-12-05 ENCOUNTER — Encounter: Payer: Self-pay | Admitting: Physician Assistant

## 2021-12-05 VITALS — BP 99/65 | HR 97 | Ht 70.0 in | Wt 237.0 lb

## 2021-12-05 DIAGNOSIS — E785 Hyperlipidemia, unspecified: Secondary | ICD-10-CM

## 2021-12-05 DIAGNOSIS — I4819 Other persistent atrial fibrillation: Secondary | ICD-10-CM

## 2021-12-05 DIAGNOSIS — E119 Type 2 diabetes mellitus without complications: Secondary | ICD-10-CM

## 2021-12-05 DIAGNOSIS — I2510 Atherosclerotic heart disease of native coronary artery without angina pectoris: Secondary | ICD-10-CM | POA: Diagnosis not present

## 2021-12-05 DIAGNOSIS — I10 Essential (primary) hypertension: Secondary | ICD-10-CM

## 2021-12-05 DIAGNOSIS — C220 Liver cell carcinoma: Secondary | ICD-10-CM

## 2021-12-05 NOTE — Patient Instructions (Addendum)
Medication Instructions:  ?HOLD Amlodipine until further notice  ? ?*If you need a refill on your cardiac medications before your next appointment, please call your pharmacy* ? ?Lab Work: ?NONE ordered at this time of appointment  ? ?If you have labs (blood work) drawn today and your tests are completely normal, you will receive your results only by: ?MyChart Message (if you have MyChart) OR ?A paper copy in the mail ?If you have any lab test that is abnormal or we need to change your treatment, we will call you to review the results. ? ?Testing/Procedures: ?Your physician has recommended that you have a Cardioversion (DCCV). Electrical Cardioversion uses a jolt of electricity to your heart either through paddles or wired patches attached to your chest. This is a controlled, usually prescheduled, procedure. Defibrillation is done under light anesthesia in the hospital, and you usually go home the day of the procedure. This is done to get your heart back into a normal rhythm. You are not awake for the procedure. Please see the instruction sheet given to you today. ? ?Scheduled for 12/27/21 at 7:30 AM  ? ?Follow-Up: ?At [facility], you and your health needs are our priority.  As part of our continuing mission to provide you with exceptional heart care, we have created designated Provider Care Teams.  These Care Teams include your primary Cardiologist (physician) and Advanced Practice Providers (APPs -  Physician Assistants and Nurse Practitioners) who all work together to provide you with the care you need, when you need it.  ? ?Your next appointment:   ?With [facility] 2 weeks post Cardioversion ?4 weeks post Cardioversion   ? ?The format for your next appointment:   ?In Person ? ?Provider:   ?Almyra Deforest, [name]     [name]  ? ?Other Instructions ? ? ?Dear Matthew Chen,  ? ?You are scheduled for a Cardioversion on Thursday 12/27/21 with Dr. Stanford Breed.  Please arrive at the [facility] (Main Entrance A) at [facility]: [address] [place]  [address] at 6:30 am/pm. (1 hour prior to procedure unless lab work is needed; if lab work is needed arrive 1.5 hours ahead) ? ?DIET: Nothing to eat or drink after midnight except a sip of water with medications (see medication instructions below) ? ?FYI: For your safety, and to allow [place] to monitor your vital signs accurately during the surgery/procedure we request that   ?if you have artificial nails, gel coating, SNS etc. Please have those removed prior to your surgery/procedure. Not having the nail coverings /polish removed may result in cancellation or delay of your surgery/procedure. ? ?Medication Instructions: ?Hold Jardiance, Glizipizide, Zestoretic,  ? ?Continue your anticoagulant: Eliquis  ?You will need to continue your anticoagulant after your procedure until you  are told by your  ?Provider that it is safe to stop ? ?Labs: If patient is on Coumadin, patient needs pt/INR, CBC, BMET within 3 days (No pt/INR needed for patients taking Xarelto, Eliquis, Pradaxa) ?For patients receiving anesthesia for TEE and all Cardioversion patients: BMET, CBC within 1 week ? ?You must have a responsible person to drive you home and stay in the waiting area during your procedure. Failure to do so could result in cancellation. ? ?Interior and spatial designer cards. ? ?*Special Note: Every effort is made to have your procedure done on time. Occasionally there are emergencies that occur at the hospital that may cause delays. Please be patient if a delay does occur.  ? ? ?Important  Information About Sugar ? ? ? ? ? ? ?

## 2021-12-05 NOTE — Progress Notes (Signed)
?Cardiology Office Note:   ? ?Date:  12/07/2021  ? ?ID:  EBEN CHOINSKI, DOB 21-Nov-1955, MRN [id number] ? ?PCP:  Susy Frizzle, MD ?  ?[facility] Providers ?Cardiologist:  Quay Burow, MD    ? ?Referring MD: Susy Frizzle, MD  ? ?Chief Complaint  ?Patient presents with  ? Follow-up  ?  Seen for [name]  ? ? ?History of Present Illness:   ? ?Matthew Chen is a 66 y.o. male with a hx of thoracic aortic aneurysm, CKD stage III, CAD, hypertension, hyperlipidemia, DM2, PAD and paroxysmal atrial fibrillation.  He smoked for 25 years and quit more than 17 years ago.  He has never had a heart attack or stroke.  He underwent orbital rotational arthrectomy and the drug eluting balloon angioplasty of distal left SFA in November 2016.  Patient underwent another orbital rotational arthrectomy and balloon angioplasty for high-grade subocclusive right popliteal plaque in December 2016.  Doppler in August 2019 showed a normal ABI bilaterally with high-frequency signal in the right popliteal artery.  He underwent another angioplasty in September 2019 revealing 95% right popliteal stenosis with three-vessel runoff, this was treated with rotational atherectomy and self-expanding stent.  Most recent Doppler in April 2020 revealed a normal ABI bilaterally with patent SFA stent.  Due to complaint of exertional chest pain and dyspnea, a 2D echocardiogram was performed last year that showed EF of 60 to 65%, mild MR, mild aortic stenosis, 46 mm ascending thoracic aortic aneurysm.  Myoview was nonischemic.  Chest CT did show a 7.1 cm mass in the liver.  MRI of the liver showed hyperenhancing subcapsular mass of the anterior inferior right lobe of the liver, most consistent with hepatocellular carcinoma.  Patient has been referred to GI and oncology.  Subsequent liver biopsy confirmed hepatocellular carcinoma.  Cardiac catheterization in June 2022 revealed a high-grade subtotally occluded ostial calcified D1.  Patient returned a  week later on 02/15/2021 with plan to intervene on the significant diagonal lesion, however procedure was complicated by coronary perforation.  Fortunately he did not develop pericardial effusion.  During the hospitalization, he had paroxysmal atrial fibrillation which was also recorded on event monitor subsequent to that admission.  He has been placed on Eliquis.  2D echo performed in August 2022 revealed normal EF, grade 2 DD, mild aortic stenosis and a mildly dilated ascending aorta.  ? ?Patient presents today for evaluation of shortness of breath and fatigue that has been going on for the past several weeks.  He denies any palpitation.  EKG today shows he is back in atrial fibrillation with a heart rate of 97 bpm.  His heart rate is fairly controlled.  Unfortunately he has missed a dose of Eliquis in the past few weeks, he does not know when was the last missed dose of Eliquis.  I suspect his symptom is more related to A-fib, his blood pressure is borderline low, I decided to stop his amlodipine.  I discussed his case with [name], I would plan for the patient to undergo outpatient cardioversion.  He has upcoming CMP and CBC next week in preparation for liver MRI.  He is aware of uninterrupted Eliquis for 3 weeks prior to the cardioversion and at least 4 weeks afterward.  He currently has a follow-up with A-fib clinic next Monday, we will push this appointment back to roughly about a week after the cardioversion.  We will see the patient back about 4 weeks after the cardioversion.  If  his fatigue and shortness of breath does not improve after the cardioversion despite holding sinus rhythm, I likely will repeat echocardiogram at that time.  If he has any recurrence of A-fib in the future, I suspect the patient is going to be a good candidate for Multaq as long as he does not have stage IV CHF. ? ? ?Past Medical History:  ?Diagnosis Date  ? Adenomatous colon polyp 03/2008  ? Anginal pain ([place])   ? Aortic aneurysm,  thoracic ([place]) 09/09/2016  ? 4.4 cm by echo 05/2015  ? Arthritis   ? "joints in my fingers ache" (07/13/2015)  ? Bicuspid aortic valve 09/09/2016  ? Cataract 2019  ? bilateral eyes  ? CKD (chronic kidney disease), stage III ([place])   ? Coronary artery disease   ? Diabetes mellitus without complication ([place])   ? Hemorrhoids   ? Hepatocellular carcinoma ([place])   ? Hyperlipidemia   ? Hypertension   ? Paroxysmal atrial fibrillation (H[facility])   ? Peripheral arterial disease ([place])   ? a. dopppler 05/29/2015 revealed high-grade right popliteal and distal left SFA disease b. LE angio 06/28/2015 revealing high grade calcific dx with distal L SFA and R popliteal artery s/p diamondback orbital rotational atherectomy, PTA of L SFA  c. 07/13/2015 diamondback orbital rotational atherectomy and drug eluting balloon angioplasty of R popliteal artery (P2 segment) using distal protection   ? Type II diabetes mellitus ([place])   ? Ulcerative colitis   ? ? ?Past Surgical History:  ?Procedure Laterality Date  ? ABDOMINAL AORTOGRAM W/LOWER EXTREMITY N/A 04/27/2018  ? Procedure: ABDOMINAL AORTOGRAM W/LOWER EXTREMITY;  Surgeon: Lorretta Harp, MD;  Location: [facility];  Service: Cardiovascular;  Laterality: N/A;  ? COLONOSCOPY    ? COLONOSCOPY W/ POLYPECTOMY  X 4-5  ? CORONARY ATHERECTOMY N/A 02/15/2021  ? Procedure: CORONARY ATHERECTOMY;  Surgeon: Lorretta Harp, MD;  Location: [facility];  Service: Cardiovascular;  Laterality: N/A;  aborted   ? CORONARY BALLOON ANGIOPLASTY N/A 02/15/2021  ? Procedure: CORONARY BALLOON ANGIOPLASTY;  Surgeon: Lorretta Harp, MD;  Location: [facility];  Service: Cardiovascular;  Laterality: N/A;  ? COSMETIC SURGERY  < 2000  ? "removed birthmark from top of my head"  ? IR ANGIOGRAM SELECTIVE EACH ADDITIONAL VESSEL  06/08/2021  ? IR ANGIOGRAM SELECTIVE EACH ADDITIONAL VESSEL  06/08/2021  ? IR ANGIOGRAM SELECTIVE EACH ADDITIONAL VESSEL  06/08/2021  ? IR ANGIOGRAM SELECTIVE EACH ADDITIONAL  VESSEL  06/08/2021  ? IR ANGIOGRAM SELECTIVE EACH ADDITIONAL VESSEL  06/21/2021  ? IR ANGIOGRAM SELECTIVE EACH ADDITIONAL VESSEL  06/21/2021  ? IR ANGIOGRAM SELECTIVE EACH ADDITIONAL VESSEL  06/21/2021  ? IR ANGIOGRAM SELECTIVE EACH ADDITIONAL VESSEL  06/21/2021  ? IR ANGIOGRAM VISCERAL SELECTIVE  06/08/2021  ? IR ANGIOGRAM VISCERAL SELECTIVE  06/21/2021  ? IR EMBO ARTERIAL NOT HEMORR HEMANG INC GUIDE ROADMAPPING  06/08/2021  ? IR EMBO TUMOR ORGAN ISCHEMIA INFARCT INC GUIDE ROADMAPPING  06/21/2021  ? IR EMBO TUMOR ORGAN ISCHEMIA INFARCT INC GUIDE ROADMAPPING  06/21/2021  ? IR RADIOLOGIST EVAL & MGMT  05/08/2021  ? IR RADIOLOGIST EVAL & MGMT  07/26/2021  ? IR RADIOLOGIST EVAL & MGMT  09/05/2021  ? IR US GUIDE VASC ACCESS LEFT  06/21/2021  ? IR US GUIDE VASC ACCESS RIGHT  06/08/2021  ? LEFT HEART CATH AND CORONARY ANGIOGRAPHY N/A 02/08/2021  ? Procedure: LEFT HEART CATH AND CORONARY ANGIOGRAPHY;  Surgeon: Lorretta Harp, MD;  Location: [facility];  Service: Cardiovascular;  Laterality: N/A;  ? PERIPHERAL VASCULAR ATHERECTOMY  04/27/2018  ? Procedure: PERIPHERAL VASCULAR ATHERECTOMY;  Surgeon: Lorretta Harp, MD;  Location: [facility];  Service: Cardiovascular;;  right popliteal artery  ? PERIPHERAL VASCULAR CATHETERIZATION N/A 06/29/2015  ? Procedure: Lower Extremity Angiography;  Surgeon: Lorretta Harp, MD;  Location: [facility];  Service: Cardiovascular;  Laterality: N/A;  ? PERIPHERAL VASCULAR CATHETERIZATION N/A 07/13/2015  ? Procedure: Lower Extremity Angiography;  Surgeon: Lorretta Harp, MD;  Location: [facility];  Service: Cardiovascular;  Laterality: N/A;  ? PERIPHERAL VASCULAR CATHETERIZATION  07/13/2015  ? Procedure: Peripheral Vascular Atherectomy;  Surgeon: Lorretta Harp, MD;  Location: [facility];  Service: Cardiovascular;;  ? PERIPHERAL VASCULAR INTERVENTION  04/27/2018  ? Procedure: PERIPHERAL VASCULAR INTERVENTION;  Surgeon: Lorretta Harp, MD;   Location: [facility];  Service: Cardiovascular;;  right popliteal artery  ? POLYPECTOMY    ? TONSILLECTOMY    ? UPPER GASTROINTESTINAL ENDOSCOPY    ? ? ?Current Medications: ?Current Meds  ?Me

## 2021-12-07 ENCOUNTER — Other Ambulatory Visit: Payer: Self-pay | Admitting: Physician Assistant

## 2021-12-07 ENCOUNTER — Encounter: Payer: Self-pay | Admitting: Physician Assistant

## 2021-12-07 DIAGNOSIS — I4819 Other persistent atrial fibrillation: Secondary | ICD-10-CM

## 2021-12-10 ENCOUNTER — Ambulatory Visit (HOSPITAL_COMMUNITY): Payer: HMO | Admitting: Physician Assistant

## 2021-12-10 ENCOUNTER — Ambulatory Visit (HOSPITAL_BASED_OUTPATIENT_CLINIC_OR_DEPARTMENT_OTHER): Payer: HMO | Admitting: Family

## 2021-12-11 DIAGNOSIS — L309 Dermatitis, unspecified: Secondary | ICD-10-CM | POA: Diagnosis not present

## 2021-12-13 ENCOUNTER — Other Ambulatory Visit: Payer: Self-pay

## 2021-12-13 DIAGNOSIS — C220 Liver cell carcinoma: Secondary | ICD-10-CM | POA: Diagnosis not present

## 2021-12-13 DIAGNOSIS — K5190 Ulcerative colitis, unspecified, without complications: Secondary | ICD-10-CM

## 2021-12-14 LAB — PROTIME-INR
INR: 1.2 — ABNORMAL HIGH
Prothrombin Time: 12.3 s — ABNORMAL HIGH (ref 9.0–11.5)

## 2021-12-14 LAB — AFP TUMOR MARKER: AFP-Tumor Marker: 4.1 ng/mL (ref <6.1)

## 2021-12-14 LAB — CBC
HCT: 38.9 % (ref 38.5–50.0)
Hemoglobin: 12.1 g/dL — ABNORMAL LOW (ref 13.2–17.1)
MCH: 27.9 pg (ref 27.0–33.0)
MCHC: 31.1 g/dL — ABNORMAL LOW (ref 32.0–36.0)
MCV: 89.8 fL (ref 80.0–100.0)
MPV: 11.9 fL (ref 7.5–12.5)
Platelets: 313 10*3/uL (ref 140–400)
RBC: 4.33 10*6/uL (ref 4.20–5.80)
RDW: 15.3 % — ABNORMAL HIGH (ref 11.0–15.0)
WBC: 4.3 10*3/uL (ref 3.8–10.8)

## 2021-12-14 LAB — COMPLETE METABOLIC PANEL WITH GFR
AG Ratio: 1.3 (calc) (ref 1.0–2.5)
ALT: 15 U/L (ref 9–46)
AST: 24 U/L (ref 10–35)
Albumin: 3.9 g/dL (ref 3.6–5.1)
Alkaline phosphatase (APISO): 61 U/L (ref 35–144)
BUN/Creatinine Ratio: 13 (calc) (ref 6–22)
BUN: 28 mg/dL — ABNORMAL HIGH (ref 7–25)
CO2: 25 mmol/L (ref 20–32)
Calcium: 9.1 mg/dL (ref 8.6–10.3)
Chloride: 104 mmol/L (ref 98–110)
Creat: 2.16 mg/dL — ABNORMAL HIGH (ref 0.70–1.35)
Globulin: 3 g/dL (calc) (ref 1.9–3.7)
Glucose, Bld: 87 mg/dL (ref 65–99)
Potassium: 4.8 mmol/L (ref 3.5–5.3)
Sodium: 139 mmol/L (ref 135–146)
Total Bilirubin: 0.5 mg/dL (ref 0.2–1.2)
Total Protein: 6.9 g/dL (ref 6.1–8.1)
eGFR: 33 mL/min/{1.73_m2} — ABNORMAL LOW (ref >=60)

## 2021-12-19 ENCOUNTER — Ambulatory Visit
Admission: RE | Admit: 2021-12-19 | Discharge: 2021-12-19 | Disposition: A | Discharge status: Home / Self Care | Payer: HMO | Source: Ambulatory Visit | Attending: General Surgery | Admitting: General Surgery

## 2021-12-19 DIAGNOSIS — C220 Liver cell carcinoma: Secondary | ICD-10-CM | POA: Diagnosis not present

## 2021-12-19 DIAGNOSIS — R160 Hepatomegaly, not elsewhere classified: Secondary | ICD-10-CM | POA: Diagnosis not present

## 2021-12-19 DIAGNOSIS — K7689 Other specified diseases of liver: Secondary | ICD-10-CM | POA: Diagnosis not present

## 2021-12-19 MED ORDER — GADOBENATE DIMEGLUMINE 529 MG/ML IV SOLN
20.0000 mL | Freq: Once | INTRAVENOUS | Status: AC | PRN
  Administered 2021-12-19: 20 mL via INTRAVENOUS

## 2021-12-20 ENCOUNTER — Encounter (HOSPITAL_COMMUNITY): Payer: Self-pay | Admitting: Cardiology

## 2021-12-20 NOTE — Progress Notes (Signed)
Attempted to obtain medical history via telephone, unable to reach at this time. I left a voicemail to return pre surgical testing department's phone call.  

## 2021-12-25 ENCOUNTER — Ambulatory Visit (HOSPITAL_COMMUNITY)
Admission: RE | Admit: 2021-12-25 | Discharge: 2021-12-25 | Disposition: A | Discharge status: Home / Self Care | Payer: HMO | Source: Ambulatory Visit | Attending: Cardiology | Admitting: Cardiology

## 2021-12-25 DIAGNOSIS — Z9862 Peripheral vascular angioplasty status: Secondary | ICD-10-CM | POA: Insufficient documentation

## 2021-12-25 DIAGNOSIS — I739 Peripheral vascular disease, unspecified: Secondary | ICD-10-CM | POA: Diagnosis not present

## 2021-12-25 DIAGNOSIS — Z959 Presence of cardiac and vascular implant and graft, unspecified: Secondary | ICD-10-CM | POA: Diagnosis not present

## 2021-12-26 LAB — HM DIABETES EYE EXAM

## 2021-12-27 ENCOUNTER — Ambulatory Visit
Admission: RE | Admit: 2021-12-27 | Discharge: 2021-12-27 | Disposition: A | Discharge status: Home / Self Care | Payer: HMO | Source: Ambulatory Visit | Attending: Interventional Radiology | Admitting: Interventional Radiology

## 2021-12-27 ENCOUNTER — Ambulatory Visit (HOSPITAL_COMMUNITY): Payer: HMO | Admitting: Anesthesiology

## 2021-12-27 ENCOUNTER — Other Ambulatory Visit: Payer: Self-pay

## 2021-12-27 ENCOUNTER — Ambulatory Visit (HOSPITAL_COMMUNITY)
Admission: RE | Admit: 2021-12-27 | Discharge: 2021-12-27 | Disposition: A | Discharge status: Home / Self Care | Payer: HMO | Attending: Cardiology | Admitting: Cardiology

## 2021-12-27 ENCOUNTER — Encounter (HOSPITAL_COMMUNITY)
Admission: RE | Disposition: A | Discharge status: Home / Self Care | Payer: HMO | Source: Home / Self Care | Attending: Cardiology

## 2021-12-27 ENCOUNTER — Encounter (HOSPITAL_COMMUNITY): Payer: Self-pay | Admitting: Cardiology

## 2021-12-27 DIAGNOSIS — I4819 Other persistent atrial fibrillation: Secondary | ICD-10-CM | POA: Diagnosis not present

## 2021-12-27 DIAGNOSIS — C220 Liver cell carcinoma: Secondary | ICD-10-CM

## 2021-12-27 DIAGNOSIS — Z539 Procedure and treatment not carried out, unspecified reason: Secondary | ICD-10-CM | POA: Insufficient documentation

## 2021-12-27 DIAGNOSIS — E1122 Type 2 diabetes mellitus with diabetic chronic kidney disease: Secondary | ICD-10-CM | POA: Diagnosis not present

## 2021-12-27 DIAGNOSIS — Z87891 Personal history of nicotine dependence: Secondary | ICD-10-CM | POA: Insufficient documentation

## 2021-12-27 DIAGNOSIS — E1151 Type 2 diabetes mellitus with diabetic peripheral angiopathy without gangrene: Secondary | ICD-10-CM | POA: Diagnosis not present

## 2021-12-27 DIAGNOSIS — N183 Chronic kidney disease, stage 3 unspecified: Secondary | ICD-10-CM | POA: Insufficient documentation

## 2021-12-27 DIAGNOSIS — I712 Thoracic aortic aneurysm, without rupture, unspecified: Secondary | ICD-10-CM | POA: Insufficient documentation

## 2021-12-27 DIAGNOSIS — E785 Hyperlipidemia, unspecified: Secondary | ICD-10-CM | POA: Diagnosis not present

## 2021-12-27 DIAGNOSIS — I2510 Atherosclerotic heart disease of native coronary artery without angina pectoris: Secondary | ICD-10-CM | POA: Diagnosis not present

## 2021-12-27 DIAGNOSIS — I129 Hypertensive chronic kidney disease with stage 1 through stage 4 chronic kidney disease, or unspecified chronic kidney disease: Secondary | ICD-10-CM | POA: Insufficient documentation

## 2021-12-27 HISTORY — PX: IR RADIOLOGIST EVAL & MGMT: IMG5224

## 2021-12-27 SURGERY — CANCELLED PROCEDURE

## 2021-12-27 MED ORDER — SODIUM CHLORIDE 0.9 % IV SOLN: INTRAVENOUS | Status: DC

## 2021-12-27 NOTE — Progress Notes (Signed)
Referring Physician(s): Drs. Clinchco  Reason for follow up: 6 month follow up after TARE, telephone virtual visit   History of present illness: 66 year old male with 7 cm unifocal right hepatocellular carcinoma in the absence of significant liver disease Ardine Eng Pugh A, ALBI Grade 1).  Given size, hepatectomy was not indicated.  Given location and otherwise healthy liver, he was deemed a candidate for locoregional therapy with radiation segmentectomy.  Split dose TARE delivered via segment 6 and 7 branches to the right hepatic mass without complications on 70/96/28.  Initial MRI was obtained 2 months after the TARE, and enhancement around the mass was inconclusive as to representing post-treatment changes versus persistent viability.     He continues to do well since the procedure.  Denies nausea, vomiting, change in bowel habits, fevers/chills, and pain.   Past Medical History:  Diagnosis Date   Adenomatous colon polyp 03/2008   Anginal pain ([place])    Aortic aneurysm, thoracic ([facility]) 09/09/2016   4.4 cm by echo 05/2015   Arthritis    "joints in my fingers ache" (07/13/2015)   Bicuspid aortic valve 09/09/2016   Cataract 2019   bilateral eyes   CKD (chronic kidney disease), stage III ([place])    Coronary artery disease    Diabetes mellitus without complication ([place])    Hemorrhoids    Hepatocellular carcinoma ([place])    Hyperlipidemia    Hypertension    Paroxysmal atrial fibrillation ([place])    Peripheral arterial disease ([place])    a. dopppler 05/29/2015 revealed high-grade right popliteal and distal left SFA disease b. LE angio 06/28/2015 revealing high grade calcific dx with distal L SFA and R popliteal artery s/p diamondback orbital rotational atherectomy, PTA of L SFA  c. 07/13/2015 diamondback orbital rotational atherectomy and drug eluting balloon angioplasty of R popliteal artery (P2 segment) using distal protection    Type II diabetes mellitus ([place])    Ulcerative  colitis     Past Surgical History:  Procedure Laterality Date   ABDOMINAL AORTOGRAM W/LOWER EXTREMITY N/A 04/27/2018   Procedure: ABDOMINAL AORTOGRAM W/LOWER EXTREMITY;  Surgeon: Lorretta Harp, MD;  Location: [facility];  Service: Cardiovascular;  Laterality: N/A;   COLONOSCOPY     COLONOSCOPY W/ POLYPECTOMY  X 4-5   CORONARY ATHERECTOMY N/A 02/15/2021   Procedure: CORONARY ATHERECTOMY;  Surgeon: Lorretta Harp, MD;  Location: [facility];  Service: Cardiovascular;  Laterality: N/A;  aborted    CORONARY BALLOON ANGIOPLASTY N/A 02/15/2021   Procedure: CORONARY BALLOON ANGIOPLASTY;  Surgeon: Lorretta Harp, MD;  Location: [facility];  Service: Cardiovascular;  Laterality: N/A;   COSMETIC SURGERY  < 2000   "removed birthmark from top of my head"   IR ANGIOGRAM SELECTIVE EACH ADDITIONAL VESSEL  06/08/2021   IR ANGIOGRAM SELECTIVE EACH ADDITIONAL VESSEL  06/08/2021   IR ANGIOGRAM SELECTIVE EACH ADDITIONAL VESSEL  06/08/2021   IR ANGIOGRAM SELECTIVE EACH ADDITIONAL VESSEL  06/08/2021   IR ANGIOGRAM SELECTIVE EACH ADDITIONAL VESSEL  06/21/2021   IR ANGIOGRAM SELECTIVE EACH ADDITIONAL VESSEL  06/21/2021   IR ANGIOGRAM SELECTIVE EACH ADDITIONAL VESSEL  06/21/2021   IR ANGIOGRAM SELECTIVE EACH ADDITIONAL VESSEL  06/21/2021   IR ANGIOGRAM VISCERAL SELECTIVE  06/08/2021   IR ANGIOGRAM VISCERAL SELECTIVE  06/21/2021   IR EMBO ARTERIAL NOT HEMORR HEMANG INC GUIDE ROADMAPPING  06/08/2021   IR EMBO TUMOR ORGAN ISCHEMIA INFARCT INC GUIDE ROADMAPPING  06/21/2021   IR EMBO TUMOR  ORGAN ISCHEMIA INFARCT INC GUIDE ROADMAPPING  06/21/2021   IR RADIOLOGIST EVAL & MGMT  05/08/2021   IR RADIOLOGIST EVAL & MGMT  07/26/2021   IR RADIOLOGIST EVAL & MGMT  09/05/2021   IR US GUIDE VASC ACCESS LEFT  06/21/2021   IR US GUIDE VASC ACCESS RIGHT  06/08/2021   LEFT HEART CATH AND CORONARY ANGIOGRAPHY N/A 02/08/2021   Procedure: LEFT HEART CATH AND CORONARY ANGIOGRAPHY;  Surgeon: Lorretta Harp, MD;  Location: [facility];  Service: Cardiovascular;  Laterality: N/A;   PERIPHERAL VASCULAR ATHERECTOMY  04/27/2018   Procedure: PERIPHERAL VASCULAR ATHERECTOMY;  Surgeon: Lorretta Harp, MD;  Location: [facility];  Service: Cardiovascular;;  right popliteal artery   PERIPHERAL VASCULAR CATHETERIZATION N/A 06/29/2015   Procedure: Lower Extremity Angiography;  Surgeon: Lorretta Harp, MD;  Location: [facility];  Service: Cardiovascular;  Laterality: N/A;   PERIPHERAL VASCULAR CATHETERIZATION N/A 07/13/2015   Procedure: Lower Extremity Angiography;  Surgeon: Lorretta Harp, MD;  Location: [facility];  Service: Cardiovascular;  Laterality: N/A;   PERIPHERAL VASCULAR CATHETERIZATION  07/13/2015   Procedure: Peripheral Vascular Atherectomy;  Surgeon: Lorretta Harp, MD;  Location: [facility];  Service: Cardiovascular;;   PERIPHERAL VASCULAR INTERVENTION  04/27/2018   Procedure: PERIPHERAL VASCULAR INTERVENTION;  Surgeon: Lorretta Harp, MD;  Location: [facility];  Service: Cardiovascular;;  right popliteal artery   POLYPECTOMY     TONSILLECTOMY     UPPER GASTROINTESTINAL ENDOSCOPY      Allergies: Penicillins  Medications: Prior to Admission medications   Medication Sig Start Date End Date Taking? Authorizing Provider  acetaminophen (TYLENOL) 500 MG tablet Take 1,000 mg by mouth every 6 (six) hours as needed for moderate pain or headache.    [provider]  amLODipine (NORVASC) 5 MG tablet Take 1 tablet (5 mg total) by mouth daily. 03/19/21   Susy Frizzle, MD  apixaban (ELIQUIS) 5 MG TABS tablet Take 1 tablet (5 mg total) by mouth 2 (two) times daily. 10/03/21   Susy Frizzle, MD  aspirin EC 81 MG tablet Take 1 tablet (81 mg total) by mouth daily. 06/01/15   Skeet Latch, MD  atorvastatin (LIPITOR) 80 MG tablet TAKE 1 TABLET BY MOUTH EVERY DAY 08/09/21   Lorretta Harp, MD  B-D UF III MINI PEN  NEEDLES 31G X 5 MM MISC USE DAILY WITH VICTOZA 06/20/21   Susy Frizzle, MD  carvedilol (COREG) 12.5 MG tablet Take 3 tablets (37.5 mg total) by mouth 2 (two) times daily. 07/31/21   Susy Frizzle, MD  Continuous Blood Gluc Sensor (FREESTYLE LIBRE 2 SENSOR) MISC CHECK BLOOD SUGAR FOUR TIMES DAILY 03/28/21   Susy Frizzle, MD  empagliflozin (JARDIANCE) 25 MG TABS tablet Take 1 tablet (25 mg total) by mouth daily before breakfast. 03/19/21   Susy Frizzle, MD  fenofibrate 160 MG tablet TAKE 1 TABLET BY MOUTH EVERY DAY 09/06/21   Susy Frizzle, MD  glipiZIDE (GLUCOTROL XL) 5 MG 24 hr tablet TAKE 1 TABLET BY MOUTH EVERY DAY WITH BREAKFAST 09/06/21   Susy Frizzle, MD  glucose blood (ONETOUCH ULTRA) test strip CHECK BLOOD SUGAR TWICE A DAY 03/19/21   Susy Frizzle, MD  liraglutide (VICTOZA) 18 MG/3ML SOPN INJECT 1.2 MG UNDER THE SKIN ONCE DAILY 03/19/21   Susy Frizzle, MD  lisinopril-hydrochlorothiazide (ZESTORETIC) 20-25 MG tablet Take 1 tablet by mouth daily. 03/19/21  Susy Frizzle, MD  loperamide (IMODIUM A-D) 2 MG tablet Take 2 mg by mouth 4 (four) times daily as needed for diarrhea or loose stools.    [provider]  Omega-3 Fatty Acids (FISH OIL) 1000 MG CAPS Take 1,000 mg by mouth 2 (two) times daily.    [provider]  pantoprazole (PROTONIX) 40 MG tablet Take 1 tablet (40 mg total) by mouth 2 (two) times daily. 03/19/21   Susy Frizzle, MD  pioglitazone (ACTOS) 30 MG tablet TAKE 1 TABLET BY MOUTH EVERY DAY STOP TRADJENTA 10/15/21   Susy Frizzle, MD  triamcinolone cream (KENALOG) 0.1 % Apply 1 application. topically 2 (two) times daily. For 4 weeks 12/11/21   [provider]  vedolizumab (ENTYVIO) 300 MG injection Inject 300 mg as directed See admin instructions. 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[employer], academic  Tobacco Use   Smoking status: Former    Packs/day: 1.50    Years: 25.00    Pack years: 37.50    Types: Cigarettes    Quit date: 08/12/1996    Years since quitting: 25.3   Smokeless tobacco: Never  Vaping Use   Vaping Use: Never used  Substance and Sexual Activity   Alcohol use: Yes    Alcohol/week: 1.0 standard drink    Types: 1 Cans of beer per week    Comment: social use   Drug use: No   Sexual activity: Yes  Other Topics Concern   Not on file  Social History Narrative   ** Merged History Encounter **       Social Determinants of Health   Financial Resource Strain: Low Risk    Difficulty of Paying Living Expenses: Not very hard  Food Insecurity: Not on file  Transportation Needs: Not on file  Physical Activity: Not on file  Stress: Not on file  Social Connections: Not on file     Vital Signs: There were no vitals taken for this visit.  No physical examination was performed in lieu of virtual telephone clinic visit.  Imaging: MR 03/22/21   06/08/21  Dose mapping  MR 12/19/21    Seg IV, 1.2 cm (TR5)   Seg IV, 0.7 cm (TR 4)   Seg II, 1.7 cm (TR5)  Labs:  CBC: Recent Labs    04/24/21 0610 06/08/21 0800 06/21/21 0736 12/13/21 0808  WBC 5.2 5.3 7.3 4.3  HGB 11.6* 11.6* 11.6* 12.1*  HCT 36.7* 36.9* 36.8* 38.9  PLT 278 279 299 313    COAGS: Recent Labs    04/24/21 0610 06/08/21 0800 06/21/21 0736 12/13/21 0808  INR 1.0  1.1 1.0 1.2*    BMP: Recent Labs     02/15/21 0606 02/16/21 0055 03/02/21 0807 04/17/21 1401 06/08/21 0800 06/21/21 0736 12/13/21 0808  NA 140 136   < > 139 139 139 139  K 4.2 4.4   < > 4.1 4.2 4.3 4.8  CL 106 103   < > 107 109 111 104  CO2 27 26   < > 23 23 18* 25  GLUCOSE 194* 173*   < > 118* 143* 118* 87  BUN 26* 23   < > 26* 29* 31* 28*  CALCIUM 9.1 8.7*   < > 9.2 8.9 9.4 9.1  CREATININE 1.88* 1.79*   < > 1.98* 2.02* 1.79* 2.16*  GFRNONAA 39* 42*  --   --  36* 42*  --    < > = values in this interval not displayed.    LIVER FUNCTION TESTS: Recent Labs    04/17/21 1401 06/08/21 0800 06/21/21 0736 12/13/21 0808  BILITOT 0.4 0.6 0.5 0.5  AST 22 32 24 24  ALT 24 28 26 15   ALKPHOS 57 78 55  --   PROT 7.2 7.3 7.6 6.9  ALBUMIN 4.0 3.7 3.8  --     Assessment and Plan: 66 year old male without significant underlying liver disease (Childs A, ALBI Grade 1) with biopsy proven 7 cm right hepatocellular carcinoma status post transarterial radioembolization with split dose segmentectomy on 06/21/21.  He is doing very well from a clinical standpoint, however 6 month MRI demonstrates viability of the index mass in the right lobe of the liver without significant change in size, in addition to now multifocal left lobe masses, the largest in segment II measuring up to 1.7 cm.    I reviewed his images with him in detail.  We discussed necessity of visiting with Dr. Benay Spice again soon.  From an IR perspective, we discussed the possibility of left lobar transarterial radioembolization and conventional transarterial chemoembolization to the previously treated right lobe mass.    I will reach out to Dr. Benay Spice to discuss his case.    Electronically Signed: Suzette Battiest 12/27/2021, 3:38 PM   I spent a total of 40 Minutes in virtual telephone clinical consultation, greater than 50% of which was counseling/coordinating care for hepatocellular carcinoma.

## 2021-12-27 NOTE — Progress Notes (Signed)
Patient came in to have cardioversion. Patient in NSR, sent home per Dr. Stanford Breed.

## 2021-12-27 NOTE — Progress Notes (Signed)
Patient in sinus rhythm , confirmed by ekg, case canceled by Dr Stanford Breed

## 2021-12-27 NOTE — Anesthesia Preprocedure Evaluation (Signed)
Anesthesia Evaluation  Patient identified by MRN, date of birth, ID band Patient awake    Reviewed: Allergy & Precautions, NPO status , Patient's Chart, lab work & pertinent test results  Airway        Dental   Pulmonary neg pulmonary ROS, former smoker,           Cardiovascular hypertension, Pt. on medications and Pt. on home beta blockers      Neuro/Psych negative neurological ROS  negative psych ROS   GI/Hepatic PUD, GERD  Medicated,  Endo/Other  diabetes, Type 2, Oral Hypoglycemic Agents  Renal/GU Renal diseaseCKD III  negative genitourinary   Musculoskeletal   Abdominal   Peds  Hematology negative hematology ROS (+)   Anesthesia Other Findings   Reproductive/Obstetrics                             Anesthesia Physical Anesthesia Plan Anesthesia Quick Evaluation

## 2021-12-27 NOTE — H&P (Signed)
Office Visit 12/05/2021 [place] [place] [facility] Persistent atrial fibrillation (Matthew Chen) +5 more Dx Follow-up ; Referred by Matthew Frizzle, MD Reason for Visit   Additional Documentation  Vitals:  BP 99/65 (BP Location: Left Arm, Patient Position: Sitting, Cuff Size: Normal) Pulse 97 Ht 5' 10"  (1.778 m) Wt 107.5 kg SpO2 98% BMI 34.01 kg/m BSA 2.3 m  More Vitals  Flowsheets:  Anthropometrics, NEWS, MEWS Score   Encounter Info:  Billing Info, History, Allergies, Detailed Report    All Notes   Progress Notes by Matthew Chen, [place] at 12/05/2021 8:25 AM  Author: Almyra Deforest, PA Author Type: Physician Assistant Filed: 12/07/2021 11:13 PM  Note Status: Signed Cosign: Cosign Not Required Encounter Date: 12/05/2021  Editor: Matthew Deforest, PA (Physician Assistant)             Expand All Collapse All  Cardiology Office Note:     Date:  12/07/2021    ID:  Matthew Chen, DOB [birth year], MRN [id number]   PCP:  Matthew Frizzle, MD              [facility] Providers Cardiologist:  Matthew Burow, MD      Referring MD: Matthew Frizzle, MD        Chief Complaint  Patient presents with   Follow-up      Seen for Dr. Gwenlyn Chen      History of Present Illness:     Matthew Chen is a 66 y.o. male with a hx of thoracic aortic aneurysm, CKD stage III, CAD, hypertension, hyperlipidemia, DM2, PAD and paroxysmal atrial fibrillation.  He smoked for 25 years and quit more than 17 years ago.  He has never had a heart attack or stroke.  He underwent orbital rotational arthrectomy and the drug eluting balloon angioplasty of distal left SFA in November 2016.  Patient underwent another orbital rotational arthrectomy and balloon angioplasty for high-grade subocclusive right popliteal plaque in December 2016.  Doppler in August 2019 showed a normal ABI bilaterally with high-frequency signal in the right popliteal artery.  He underwent another angioplasty in September 2019 revealing  95% right popliteal stenosis with three-vessel runoff, this was treated with rotational atherectomy and self-expanding stent.  Most recent Doppler in April 2020 revealed a normal ABI bilaterally with patent SFA stent.  Due to complaint of exertional chest pain and dyspnea, a 2D echocardiogram was performed last year that showed EF of 60 to 65%, mild MR, mild aortic stenosis, 46 mm ascending thoracic aortic aneurysm.  Myoview was nonischemic.  Chest CT did show a 7.1 cm mass in the liver.  MRI of the liver showed hyperenhancing subcapsular mass of the anterior inferior right lobe of the liver, most consistent with hepatocellular carcinoma.  Patient has been referred to GI and oncology.  Subsequent liver biopsy confirmed hepatocellular carcinoma.  Cardiac catheterization in June 2022 revealed a high-grade subtotally occluded ostial calcified D1.  Patient returned a week later on 02/15/2021 with plan to intervene on the significant diagonal lesion, however procedure was complicated by coronary perforation.  Fortunately he did not develop pericardial effusion.  During the hospitalization, he had paroxysmal atrial fibrillation which was also recorded on event monitor subsequent to that admission.  He has been placed on Eliquis.  2D echo performed in August 2022 revealed normal EF, grade 2 DD, mild aortic stenosis and a mildly dilated ascending aorta.    Patient presents today for evaluation of shortness of breath and fatigue that has  been going on for the past several weeks.  He denies any palpitation.  EKG today shows he is back in atrial fibrillation with a heart rate of 97 bpm.  His heart rate is fairly controlled.  Unfortunately he has missed a dose of Eliquis in the past few weeks, he does not know when was the last missed dose of Eliquis.  I suspect his symptom is more related to A-fib, his blood pressure is borderline low, I decided to stop his amlodipine.  I discussed his case with Dr. Gwenlyn Chen, I would plan for the  patient to undergo outpatient cardioversion.  He has upcoming CMP and CBC next week in preparation for liver MRI.  He is aware of uninterrupted Eliquis for 3 weeks prior to the cardioversion and at least 4 weeks afterward.  He currently has a follow-up with A-fib clinic next Monday, we will push this appointment back to roughly about a week after the cardioversion.  We will see the patient back about 4 weeks after the cardioversion.  If his fatigue and shortness of breath does not improve after the cardioversion despite holding sinus rhythm, I likely will repeat echocardiogram at that time.  If he has any recurrence of A-fib in the future, I suspect the patient is going to be a good candidate for Multaq as long as he does not have stage IV CHF.         Past Medical History:  Diagnosis Date   Adenomatous colon polyp 03/2008   Anginal pain ([place])     Aortic aneurysm, thoracic ([place]) 09/09/2016    4.4 cm by echo 05/2015   Arthritis      "joints in my fingers ache" (07/13/2015)   Bicuspid aortic valve 09/09/2016   Cataract 2019    bilateral eyes   CKD (chronic kidney disease), stage III ([place])     Coronary artery disease     Diabetes mellitus without complication ([place])     Hemorrhoids     Hepatocellular carcinoma ([place])     Hyperlipidemia     Hypertension     Paroxysmal atrial fibrillation ([place])     Peripheral arterial disease ([place])      a. dopppler 05/29/2015 revealed high-grade right popliteal and distal left SFA disease b. LE angio 06/28/2015 revealing high grade calcific dx with distal L SFA and R popliteal artery s/p diamondback orbital rotational atherectomy, PTA of L SFA  c. 07/13/2015 diamondback orbital rotational atherectomy and drug eluting balloon angioplasty of R popliteal artery (P2 segment) using distal protection    Type II diabetes mellitus ([place])     Ulcerative colitis             Past Surgical History:  Procedure Laterality Date   ABDOMINAL AORTOGRAM W/LOWER EXTREMITY N/A  04/27/2018    Procedure: ABDOMINAL AORTOGRAM W/LOWER EXTREMITY;  Surgeon: Lorretta Harp, MD;  Location: [place] [facility];  Service: Cardiovascular;  Laterality: N/A;   COLONOSCOPY       COLONOSCOPY W/ POLYPECTOMY   X 4-5   CORONARY ATHERECTOMY N/A 02/15/2021    Procedure: CORONARY ATHERECTOMY;  Surgeon: Lorretta Harp, MD;  Location: [place] [facility];  Service: Cardiovascular;  Laterality: N/A;  aborted    CORONARY BALLOON ANGIOPLASTY N/A 02/15/2021    Procedure: CORONARY BALLOON ANGIOPLASTY;  Surgeon: Lorretta Harp, MD;  Location: [place] [facility];  Service: Cardiovascular;  Laterality: N/A;   COSMETIC SURGERY   < 2000    "removed birthmark from top of my  head"   IR ANGIOGRAM SELECTIVE EACH ADDITIONAL VESSEL   06/08/2021   IR ANGIOGRAM SELECTIVE EACH ADDITIONAL VESSEL   06/08/2021   IR ANGIOGRAM SELECTIVE EACH ADDITIONAL VESSEL   06/08/2021   IR ANGIOGRAM SELECTIVE EACH ADDITIONAL VESSEL   06/08/2021   IR ANGIOGRAM SELECTIVE EACH ADDITIONAL VESSEL   06/21/2021   IR ANGIOGRAM SELECTIVE EACH ADDITIONAL VESSEL   06/21/2021   IR ANGIOGRAM SELECTIVE EACH ADDITIONAL VESSEL   06/21/2021   IR ANGIOGRAM SELECTIVE EACH ADDITIONAL VESSEL   06/21/2021   IR ANGIOGRAM VISCERAL SELECTIVE   06/08/2021   IR ANGIOGRAM VISCERAL SELECTIVE   06/21/2021   IR EMBO ARTERIAL NOT HEMORR HEMANG INC GUIDE ROADMAPPING   06/08/2021   IR EMBO TUMOR ORGAN ISCHEMIA INFARCT INC GUIDE ROADMAPPING   06/21/2021   IR EMBO TUMOR ORGAN ISCHEMIA INFARCT INC GUIDE ROADMAPPING   06/21/2021   IR RADIOLOGIST EVAL & MGMT   05/08/2021   IR RADIOLOGIST EVAL & MGMT   07/26/2021   IR RADIOLOGIST EVAL & MGMT   09/05/2021   IR US GUIDE VASC ACCESS LEFT   06/21/2021   IR US GUIDE VASC ACCESS RIGHT   06/08/2021   LEFT HEART CATH AND CORONARY ANGIOGRAPHY N/A 02/08/2021    Procedure: LEFT HEART CATH AND CORONARY ANGIOGRAPHY;  Surgeon: Lorretta Harp, MD;  Location: [place] [facility];  Service: Cardiovascular;  Laterality:  N/A;   PERIPHERAL VASCULAR ATHERECTOMY   04/27/2018    Procedure: PERIPHERAL VASCULAR ATHERECTOMY;  Surgeon: Lorretta Harp, MD;  Location: [place] [facility];  Service: Cardiovascular;;  right popliteal artery   PERIPHERAL VASCULAR CATHETERIZATION N/A 06/29/2015    Procedure: Lower Extremity Angiography;  Surgeon: Lorretta Harp, MD;  Location: [place] [facility];  Service: Cardiovascular;  Laterality: N/A;   PERIPHERAL VASCULAR CATHETERIZATION N/A 07/13/2015    Procedure: Lower Extremity Angiography;  Surgeon: Lorretta Harp, MD;  Location: [place] [facility];  Service: Cardiovascular;  Laterality: N/A;   PERIPHERAL VASCULAR CATHETERIZATION   07/13/2015    Procedure: Peripheral Vascular Atherectomy;  Surgeon: Lorretta Harp, MD;  Location: [place] [facility];  Service: Cardiovascular;;   PERIPHERAL VASCULAR INTERVENTION   04/27/2018    Procedure: PERIPHERAL VASCULAR INTERVENTION;  Surgeon: Lorretta Harp, MD;  Location: [place] [facility];  Service: Cardiovascular;;  right popliteal artery   POLYPECTOMY       TONSILLECTOMY       UPPER GASTROINTESTINAL ENDOSCOPY          Current Medications: Active Medications      Current Meds  Medication Sig   acetaminophen (TYLENOL) 500 MG tablet Take 1,000 mg by mouth every 6 (six) hours as needed for moderate pain or headache.   amLODipine (NORVASC) 5 MG tablet Take 1 tablet (5 mg total) by mouth daily.   apixaban (ELIQUIS) 5 MG TABS tablet Take 1 tablet (5 mg total) by mouth 2 (two) times daily.   aspirin EC 81 MG tablet Take 1 tablet (81 mg total) by mouth daily.   atorvastatin (LIPITOR) 80 MG tablet TAKE 1 TABLET BY MOUTH EVERY DAY   B-D UF III MINI PEN NEEDLES 31G X 5 MM MISC USE DAILY WITH VICTOZA   carvedilol (COREG) 12.5 MG tablet Take 3 tablets (37.5 mg total) by mouth 2 (two) times daily.   Continuous Blood Gluc Sensor (FREESTYLE LIBRE 2 SENSOR) MISC CHECK BLOOD SUGAR FOUR TIMES DAILY   empagliflozin (JARDIANCE) 25 MG TABS  tablet Take 1 tablet (25 mg  total) by mouth daily before breakfast.   fenofibrate 160 MG tablet TAKE 1 TABLET BY MOUTH EVERY DAY   glipiZIDE (GLUCOTROL XL) 5 MG 24 hr tablet TAKE 1 TABLET BY MOUTH EVERY DAY WITH BREAKFAST   glucose blood (ONETOUCH ULTRA) test strip CHECK BLOOD SUGAR TWICE A DAY   glycopyrrolate (ROBINUL) 1 MG tablet Take 1 tablet (1 mg total) by mouth 2 (two) times daily as needed.   liraglutide (VICTOZA) 18 MG/3ML SOPN INJECT 1.2 MG UNDER THE SKIN ONCE DAILY   lisinopril-hydrochlorothiazide (ZESTORETIC) 20-25 MG tablet Take 1 tablet by mouth daily.   loperamide (IMODIUM A-D) 2 MG tablet Take 4 mg by mouth 4 (four) times daily as needed for diarrhea or loose stools.   Omega-3 Fatty Acids (FISH OIL) 1000 MG CAPS Take 1,000 mg by mouth 2 (two) times daily.   ondansetron (ZOFRAN ODT) 4 MG disintegrating tablet Take 1 tablet (4 mg total) by mouth every 8 (eight) hours as needed for nausea or vomiting.   pantoprazole (PROTONIX) 40 MG tablet Take 1 tablet (40 mg total) by mouth 2 (two) times daily.   pioglitazone (ACTOS) 30 MG tablet TAKE 1 TABLET BY MOUTH EVERY DAY STOP TRADJENTA   predniSONE (DELTASONE) 10 MG tablet Take 4 (40 mg) tablets by mouth daily x 1 week, then reduce to 3 (30 mg) tablets by mouth daily x 1 week, then reduce to 2 (20 mg) tablets by mouth daily and stay on this dose until colonoscopy.   vedolizumab (ENTYVIO) 300 MG injection Inject 300 mg as directed See admin instructions. Every 2 months        Allergies:   Penicillins    Social History         Socioeconomic History   Marital status: Married      Spouse name: Koi Zangara   Number of children: 2   Years of education: Not on file   Highest education level: Not on file  Occupational History   Occupation: Truck Archivist: Counsellor of Librarian, academic  Tobacco Use   Smoking status: Former      Packs/day: 1.50      Years: 25.00      Pack years: 37.50      Types: Cigarettes      Quit date:  08/12/1996      Years since quitting: 25.3   Smokeless tobacco: Never  Vaping Use   Vaping Use: Never used  Substance and Sexual Activity   Alcohol use: Yes      Alcohol/week: 1.0 standard drink      Types: 1 Cans of beer per week      Comment: social use   Drug use: No   Sexual activity: Yes  Other Topics Concern   Not on file  Social History Narrative    ** Merged History Encounter **         Social Determinants of Health       Financial Resource Strain: Low Risk    Difficulty of Paying Living Expenses: Not very hard  Food Insecurity: Not on file  Transportation Needs: Not on file  Physical Activity: Not on file  Stress: Not on file  Social Connections: Not on file      Family History: The patient's family history includes Colon cancer in his maternal grandmother; Colon polyps in his mother; Diabetes in his maternal aunt and mother; Heart disease in his father. There is no history of Esophageal cancer, Stomach cancer, or Rectal  cancer.   ROS:   Please see the history of present illness.     All other systems reviewed and are negative.   EKGs/Labs/Other Studies Reviewed:     The following studies were reviewed today:   Echo 03/13/2021  1. Left ventricular ejection fraction, by estimation, is 60 to 65%. The  left ventricle has normal function. The left ventricle has no regional  wall motion abnormalities. There is mild left ventricular hypertrophy.  Left ventricular diastolic parameters  are consistent with Grade II diastolic dysfunction (pseudonormalization).   2. Right ventricular systolic function is normal. The right ventricular  size is normal.   3. Left atrial size was moderately dilated.   4. Right atrial size was moderately dilated.   5. The mitral valve is normal in structure. Trivial mitral valve  regurgitation. No evidence of mitral stenosis.   6. The aortic valve is tricuspid. There is moderate calcification of the  aortic valve. Aortic valve  regurgitation is trivial. Mild aortic valve  stenosis.   7. Aortic dilatation noted. There is mild dilatation of the ascending  aorta, measuring 40 mm.   8. The inferior vena cava is normal in size with greater than 50%  respiratory variability, suggesting right atrial pressure of 3 mmHg.    EKG:  EKG is ordered today.  The ekg ordered today demonstrates atrial fibrillation, heart rate 97 bpm.   Recent Labs: 04/17/2021: TSH 1.42 06/21/2021: ALT 26; BUN 31; Creatinine, Ser 1.79; Hemoglobin 11.6; Platelets 299; Potassium 4.3; Sodium 139  Recent Lipid Panel Labs (Brief)          Component Value Date/Time    CHOL 133 03/02/2021 0807    CHOL 119 10/07/2016 0843    TRIG 266 (H) 03/02/2021 0807    HDL 30 (L) 03/02/2021 0807    HDL 26 (L) 10/07/2016 0843    CHOLHDL 4.4 03/02/2021 0807    VLDL 47 (H) 11/25/2016 0838    LDLCALC 68 03/02/2021 0807          Risk Assessment/Calculations:     CHA2DS2-VASc Score = 4   This indicates a 4.8% annual risk of stroke. The patient's score is based upon: CHF History: 0 HTN History: 1 Diabetes History: 1 Stroke History: 0 Vascular Disease History: 1 Age Score: 1 Gender Score: 0           Physical Exam:     VS:  BP 99/65 (BP Location: Left Arm, Patient Position: Sitting, Cuff Size: Normal)   Pulse 97   Ht 5' 10"  (1.778 m)   Wt 237 lb (107.5 kg)   SpO2 98%   BMI 34.01 kg/m         Wt Readings from Last 3 Encounters:  12/05/21 237 lb (107.5 kg)  08/24/21 235 lb (106.6 kg)  07/11/21 230 lb (104.3 kg)      GEN:  Well nourished, well developed in no acute distress HEENT: Normal NECK: No JVD; No carotid bruits LYMPHATICS: No lymphadenopathy CARDIAC: Irregularly irregular, no murmurs, rubs, gallops RESPIRATORY:  Clear to auscultation without rales, wheezing or rhonchi  ABDOMEN: Soft, non-tender, non-distended MUSCULOSKELETAL:  No edema; No deformity  SKIN: Warm and dry NEUROLOGIC:  Alert and oriented x 3 PSYCHIATRIC:  Normal  affect    ASSESSMENT:     1. Persistent atrial fibrillation ([place])   2. Coronary artery disease involving native coronary artery of native heart without angina pectoris   3. Essential hypertension   4. Hyperlipidemia LDL goal <70   5.  Controlled type 2 diabetes mellitus without complication, without long-term current use of insulin ([place])   6. Hepatocellular carcinoma ([place])     PLAN:     In order of problems listed above:   Persistent atrial fibrillation: Presenting symptom is shortness of breath and fatigue.  I suspect that this is likely related to recurrent atrial fibrillation.  Symptom has been going on for the past several weeks.  Unfortunately he has missed a dose of Eliquis.  We will plan for at least 3 weeks of anticoagulation therapy before proceeding with cardioversion.  The case was discussed with Dr. Gwenlyn Chen.   CAD: Cardiac catheterization in July 2122 was complicated by perforation.  Denies any recent chest pain   Hypertension: Blood pressure borderline low, will discontinue amlodipine   Hyperlipidemia: On Lipitor   DM2: Managed by primary care provider   Liver cancer: Upcoming MRI.  Followed by oncology service.         Pt presented for DCCV; noted to be in sinus rhythm; procedure cancelled. Continue present meds. Kirk Ruths

## 2021-12-28 ENCOUNTER — Encounter: Payer: Self-pay | Admitting: *Deleted

## 2021-12-31 ENCOUNTER — Telehealth: Payer: Self-pay | Admitting: Family Medicine

## 2021-12-31 NOTE — Telephone Encounter (Signed)
Patient dropped off application for assistance with JARDIANCE from [place] patient Assistance Program; requires provider's signature and additional information.  Patient requesting for forms to be faxed to [phone] upon completion. Patient also wants copy for personal records. Forms placed in folder at [facility] desk.  Please advise at [phone] after forms completed and faxed.

## 2022-01-01 ENCOUNTER — Other Ambulatory Visit: Payer: Self-pay | Admitting: Family Medicine

## 2022-01-01 DIAGNOSIS — I10 Essential (primary) hypertension: Secondary | ICD-10-CM

## 2022-01-02 NOTE — Telephone Encounter (Signed)
Requested medication (s) are due for refill today: yes  Requested medication (s) are on the active medication list: yes    Last refill: 10/03/21  #90  1 refill  Future visit scheduled no  Notes to clinic:Pt needs appt. Attempted to reach pt, not able to leave VM. Please review, Thank you.  Requested Prescriptions  Pending Prescriptions Disp Refills   ELIQUIS 5 MG TABS tablet [Pharmacy Med Name: ELIQUIS 5 MG TABLET] 60 tablet 2    Sig: TAKE 1 TABLET BY MOUTH TWICE A DAY     Hematology:  Anticoagulants - apixaban Failed - 01/02/2022  3:30 PM      Failed - HGB in normal range and within 360 days    Hemoglobin  Date Value Ref Range Status  12/13/2021 12.1 (L) 13.2 - 17.1 g/dL Final  02/06/2021 12.6 (L) 13.0 - 17.7 g/dL Final         Failed - Cr in normal range and within 360 days    Creat  Date Value Ref Range Status  12/13/2021 2.16 (H) 0.70 - 1.35 mg/dL Final         Failed - Valid encounter within last 12 months    Recent Outpatient Visits           1 year ago Type 2 diabetes mellitus with other specified complication, unspecified whether long term insulin use ([place])   [place], Cammie Mcgee, MD   1 year ago Type 2 diabetes mellitus with other specified complication, unspecified whether long term insulin use ([place])   [place] Pickard, Cammie Mcgee, MD   2 years ago Benign essential HTN   [place] Susy Frizzle, MD   2 years ago Benign essential HTN   [place] Susy Frizzle, MD   2 years ago Benign essential HTN   [place], Cammie Mcgee, MD       Future Appointments             Tomorrow Lorretta Harp, MD [facility]   In 2 weeks [place], [place], [place] [facility]             Passed - PLT in normal range and within 360 days    Platelets  Date Value Ref Range Status  12/13/2021 313 140 - 400 Thousand/uL Final   02/06/2021 259 150 - 450 x10E3/uL Final         Passed - HCT in normal range and within 360 days    HCT  Date Value Ref Range Status  12/13/2021 38.9 38.5 - 50.0 % Final   Hematocrit  Date Value Ref Range Status  02/06/2021 37.9 37.5 - 51.0 % Final         Passed - AST in normal range and within 360 days    AST  Date Value Ref Range Status  12/13/2021 24 10 - 35 U/L Final         Passed - ALT in normal range and within 360 days    ALT  Date Value Ref Range Status  12/13/2021 15 9 - 46 U/L Final

## 2022-01-03 ENCOUNTER — Encounter: Payer: Self-pay | Admitting: Cardiovascular Disease

## 2022-01-03 ENCOUNTER — Ambulatory Visit: Payer: HMO | Admitting: Cardiovascular Disease

## 2022-01-03 DIAGNOSIS — Q231 Congenital insufficiency of aortic valve: Secondary | ICD-10-CM | POA: Diagnosis not present

## 2022-01-03 DIAGNOSIS — I7121 Aneurysm of the ascending aorta, without rupture: Secondary | ICD-10-CM

## 2022-01-03 DIAGNOSIS — I2510 Atherosclerotic heart disease of native coronary artery without angina pectoris: Secondary | ICD-10-CM

## 2022-01-03 DIAGNOSIS — I480 Paroxysmal atrial fibrillation: Secondary | ICD-10-CM | POA: Diagnosis not present

## 2022-01-03 DIAGNOSIS — I739 Peripheral vascular disease, unspecified: Secondary | ICD-10-CM | POA: Diagnosis not present

## 2022-01-03 DIAGNOSIS — Q2381 Bicuspid aortic valve: Secondary | ICD-10-CM

## 2022-01-03 DIAGNOSIS — I10 Essential (primary) hypertension: Secondary | ICD-10-CM | POA: Diagnosis not present

## 2022-01-03 DIAGNOSIS — E782 Mixed hyperlipidemia: Secondary | ICD-10-CM

## 2022-01-03 NOTE — Patient Instructions (Signed)
Medication Instructions:  Your physician recommends that you continue on your current medications as directed. Please refer to the Current Medication list given to you today.  *If you need a refill on your cardiac medications before your next appointment, please call your pharmacy*   Testing/Procedures: Your physician has requested that you have an echocardiogram. Echocardiography is a painless test that uses sound waves to create images of your heart. It provides your doctor with information about the size and shape of your heart and how well your heart's chambers and valves are working. This procedure takes approximately one hour. There are no restrictions for this procedure. To be done in Aug. This procedure will be done at [address] [place] has requested that you have a lower extremity arterial duplex. This test is an ultrasound of the arteries in the legs. It looks at arterial blood flow in the legs. Allow one hour for Lower Arterial scans. There are no restrictions or special instructions  Your physician has requested that you have an ankle brachial index (ABI). During this test an ultrasound and blood pressure cuff are used to evaluate the arteries that supply the arms and legs with blood. Allow thirty minutes for this exam. There are no restrictions or special instructions. To be done in 1 year. These procedures will be done at [place]   Follow-Up: At [facility], you and your health needs are our priority.  As part of our continuing mission to provide you with exceptional heart care, we have created designated Provider Care Teams.  These Care Teams include your primary Cardiologist (physician) and Advanced Practice Providers (APPs -  Physician Assistants and Nurse Practitioners) who all work together to provide you with the care you need, when you need it.  We recommend signing up for the patient portal called "MyChart".  Sign up  information is provided on this After Visit Summary.  MyChart is used to connect with patients for Virtual Visits (Telemedicine).  Patients are able to view lab/test results, encounter notes, upcoming appointments, etc.  Non-urgent messages can be sent to your provider as well.   To learn more about what you can do with MyChart, go to [email or web address].    Your next appointment:   6 month(s)  The format for your next appointment:   In Person  Provider:   Almyra Deforest, [name]       Then, Quay Burow, MD will plan to see you again in 12 month(s).

## 2022-01-03 NOTE — Assessment & Plan Note (Signed)
History of PAF maintaining sinus rhythm now on Eliquis oral anticoagulation.  He was scheduled for DC cardioversion with Dr. Stanford Breed but converted spontaneously.  He does feel somewhat clinically improved.

## 2022-01-03 NOTE — Assessment & Plan Note (Signed)
History of essential hypertension her blood pressure measured today at 118/70.  He is on carvedilol, lisinopril, hydrochlorothiazide and amlodipine.

## 2022-01-03 NOTE — Assessment & Plan Note (Signed)
History of peripheral arterial disease dating back to 2016 when I intervened on his left SFA.  He had staged right popliteal intervention 12/16 and again in September 2019 at which time I placed a Tigressstent in his popliteal artery.  His most recent Doppler studies performed 12/25/2021 revealed a right ABI of 0.84, left of 0.99 with what appears to be an occluded Tigress stent.  He has mild right Calf claudication which is not lifestyle limiting.  At this point, we will continue to monitor him noninvasively on annual basis.

## 2022-01-03 NOTE — Assessment & Plan Note (Signed)
History of hyperlipidemia on high-dose atorvastatin and fenofibrate with lipid profile performed 03/02/2021 revealing total cholesterol 133, LDL 68 and HDL 30.

## 2022-01-03 NOTE — Assessment & Plan Note (Signed)
History of CAD status post attempt at diagonal branch orbital atherectomy and stenting 02/08/2021.  This is complicated by a small coronary perforation with some mild contrast extravasation.  The procedure was abandoned.  He did not end up having pericardial tamponade.  He currently denies chest pain.

## 2022-01-03 NOTE — Assessment & Plan Note (Signed)
History of bicuspid aortic valve with mild aortic stenosis by 2D echocardiogram performed 03/13/2021.  This will be repeated this August.

## 2022-01-03 NOTE — Assessment & Plan Note (Signed)
History of mild dilatation of the ascending thoracic aorta measuring 40 mm at the time of echo in August of last year.  This will be repeated this coming August.

## 2022-01-03 NOTE — Progress Notes (Signed)
01/03/2022 [name]   1956/01/28  [id number]  Primary Physician Pickard, Cammie Mcgee, MD Primary Cardiologist: Lorretta Harp MD Garret Reddish, [place], [place]  HPI:  Matthew Chen is a 66 y.o.   mild to moderately overweight married Caucasian male father of 4 children, grandfather of a grandchildren was referred by Dr. Skeet Latch for evaluation and treatment of claudication. I last saw him in the office   08/24/2021.  He  has a history of treated hypertension, diabetes and hyperlipidemia. 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 He's never had a heart attack or stroke and denies chest pain or shortness of breath. He does complain of lifestyle limiting claudication and had arterial Doppler studies performed in our office 05/29/15 revealing high-grade right popliteal and distal left SFA disease. Based on this, we decided to proceed with angiography and potential percutaneous revascularization. I initially angiogrammed him on 06/29/15 and performed diamondback orbital rotational atherectomy, drug eluting balloon angioplasty of the distal left SFA for a high-grade, subocclusive calcific/exophytic plaque. He had a high-grade calcific/exophytic subocclusive right popliteal artery plaque for which he underwent diamondback orbital rotation arthrectomy, drug eluding balloon angioplasty on 07/13/15 with an excellent angiographic result. Since that time his claudication has resolved. His Dopplers performed  03/17/2018 revealed normal ABIs bilaterally with the development of a high-frequency signal in his right popliteal artery.  Since I saw him 8 months ago he has developed lifestyle limiting right calf claudication.   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  He had 30 to 40% mid left SFA stenosis.  I performed diamondback orbital rotational atherectomy, PTA and stenting using a Tigris 7  mm x 40 mm long self-expanding stent.  His Dopplers normalized and his claudication has resolved.    His most recent Dopplers performed 11/13/2020 revealed normal ABIs bilaterally with a patent right SFA stent.  When I saw him 3 weeks ago he was complaining of exertional chest pain and dyspnea over the last several months.  I did obtain a 2D echo that was unremarkable with an EF of 60 to 65% with mild MR and mild AAS.  He did have a 46 mm ascending thoracic aortic aneurysm.  Myoview stress test was nonischemic.  A chest CT did show a 7.1 cm mass in his liver and a MRI was suggested.  He is wearing a event monitor currently.  Based on his symptoms we decided to proceed with outpatient diagnostic coronary angiography.  I performed coronary angiography on him 02/08/2021 revealing a high-grade subtotally occluded ostial calcified first diagonal branch stenosis.  Because of chronic renal insufficiency I elected to stage his intervention for 1 week later but did begin him on Plavix.  On 02/15/2021 I attempted to intervene on his diagonal branch but unfortunately this was complicated by coronary perforation using a "Fielder XT wire".  Luckily, he did not develop a pericardial effusion and this was quickly addressed during the procedure.  He was discharged home 2 days later.  He was found to have PAF during the hospitalization which was confirmed by event monitoring subsequent to that.  Since beginning him on antianginal medications his effort angina has significantly improved.  Since I saw him 5 months ago he continues to do well.  He did see Almyra Deforest [name] n the office in April complaining of weakness and shortness of breath.  He was  found to be in atrial fibrillation.  He was started on Eliquis with the intent to perform outpatient DC cardioversion.  When he presented to the hospital for Dr. Stanford Breed he was found to be in sinus rhythm and was sent home.  He does feel somewhat clinically improved.  He had recent lower  extremity arterial Doppler studies performed 12/25/2021 revealed a right ABI of 0.84, left 0.99 with what appears to be an occluded distal right SFA/popliteal artery.  He is only minimally symptomatic however.   Current Meds  Medication Sig   acetaminophen (TYLENOL) 500 MG tablet Take 1,000 mg by mouth every 6 (six) hours as needed for moderate pain or headache.   apixaban (ELIQUIS) 5 MG TABS tablet Take 1 tablet (5 mg total) by mouth 2 (two) times daily. PT NEEDS OV BEFORE ANY FUTURE REFILLS   aspirin EC 81 MG tablet Take 1 tablet (81 mg total) by mouth daily.   atorvastatin (LIPITOR) 80 MG tablet TAKE 1 TABLET BY MOUTH EVERY DAY   B-D UF III MINI PEN NEEDLES 31G X 5 MM MISC USE DAILY WITH VICTOZA   carvedilol (COREG) 12.5 MG tablet Take 3 tablets (37.5 mg total) by mouth 2 (two) times daily.   empagliflozin (JARDIANCE) 25 MG TABS tablet Take 1 tablet (25 mg total) by mouth daily before breakfast.   fenofibrate 160 MG tablet TAKE 1 TABLET BY MOUTH EVERY DAY   glipiZIDE (GLUCOTROL XL) 5 MG 24 hr tablet TAKE 1 TABLET BY MOUTH EVERY DAY WITH BREAKFAST   glucose blood (ONETOUCH ULTRA) test strip CHECK BLOOD SUGAR TWICE A DAY   liraglutide (VICTOZA) 18 MG/3ML SOPN INJECT 1.2 MG UNDER THE SKIN ONCE DAILY   lisinopril-hydrochlorothiazide (ZESTORETIC) 20-25 MG tablet Take 1 tablet by mouth daily.   loperamide (IMODIUM A-D) 2 MG tablet Take 2 mg by mouth 4 (four) times daily as needed for diarrhea or loose stools.   Omega-3 Fatty Acids (FISH OIL) 1000 MG CAPS Take 1,000 mg by mouth 2 (two) times daily.   pantoprazole (PROTONIX) 40 MG tablet Take 1 tablet (40 mg total) by mouth 2 (two) times daily.   pioglitazone (ACTOS) 30 MG tablet TAKE 1 TABLET BY MOUTH EVERY DAY STOP TRADJENTA   triamcinolone cream (KENALOG) 0.1 % Apply 1 application. topically 2 (two) times daily. For 4 weeks   vedolizumab (ENTYVIO) 300 MG injection Inject 300 mg as directed See admin instructions. Every 2 months     Allergies   Allergen Reactions   Penicillins Other (See Comments)    Unsure, childhood reaction Has patient had a PCN reaction causing immediate rash, facial/tongue/throat swelling, SOB or lightheadedness with hypotension: Unknown Has patient had a PCN reaction causing severe rash involving mucus membranes or skin necrosis: Unknown Has patient had a PCN reaction that required hospitalization: No Has patient had a PCN reaction occurring within the last 10 years: No If all of the above answers are "NO", then may proceed with Cephalosporin use.     Social History   Socioeconomic History   Marital status: Married    Spouse name: Justn Quale   Number of children: 2   Years of education: Not on file   Highest education level: Not on file  Occupational History   Occupation: Truck Education administrator: Counsellor of Librarian, academic  Tobacco Use   Smoking status: Former    Packs/day: 1.50    Years: 25.00    Pack years: 37.50    Types: Cigarettes  Quit date: 08/12/1996    Years since quitting: 25.4   Smokeless tobacco: Never  Vaping Use   Vaping Use: Never used  Substance and Sexual Activity   Alcohol use: Yes    Alcohol/week: 1.0 standard drink    Types: 1 Cans of beer per week    Comment: social use   Drug use: No   Sexual activity: Yes  Other Topics Concern   Not on file  Social History Narrative   ** Merged History Encounter **       Social Determinants of Health   Financial Resource Strain: Low Risk    Difficulty of Paying Living Expenses: Not very hard  Food Insecurity: Not on file  Transportation Needs: Not on file  Physical Activity: Not on file  Stress: Not on file  Social Connections: Not on file  Intimate Partner Violence: Not on file     Review of Systems: General: negative for chills, fever, night sweats or weight changes.  Cardiovascular: negative for chest pain, dyspnea on exertion, edema, orthopnea, palpitations, paroxysmal nocturnal dyspnea or shortness of  breath Dermatological: negative for rash Respiratory: negative for cough or wheezing Urologic: negative for hematuria Abdominal: negative for nausea, vomiting, diarrhea, bright red blood per rectum, melena, or hematemesis Neurologic: negative for visual changes, syncope, or dizziness All other systems reviewed and are otherwise negative except as noted above.    Blood pressure 118/70, pulse 65, height 5' 10"  (1.778 m), weight 234 lb 12.8 oz (106.5 kg), SpO2 97 %.  General appearance: alert and no distress Neck: no adenopathy, no carotid bruit, no JVD, supple, symmetrical, trachea midline, and thyroid not enlarged, symmetric, no tenderness/mass/nodules Lungs: clear to auscultation bilaterally Heart: regular rate and rhythm, S1, S2 normal, no murmur, click, rub or gallop Extremities: extremities normal, atraumatic, no cyanosis or edema Pulses: Diminished pedal pulses Skin: Skin color, texture, turgor normal. No rashes or lesions Neurologic: Grossly normal  EKG not performed today  ASSESSMENT AND PLAN:   Hypertension History of essential hypertension her blood pressure measured today at 118/70.  He is on carvedilol, lisinopril, hydrochlorothiazide and amlodipine.  Peripheral arterial disease ([place]) History of peripheral arterial disease dating back to 2016 when I intervened on his left SFA.  He had staged right popliteal intervention 12/16 and again in September 2019 at which time I placed a Tigressstent in his popliteal artery.  His most recent Doppler studies performed 12/25/2021 revealed a right ABI of 0.84, left of 0.99 with what appears to be an occluded Tigress stent.  He has mild right Calf claudication which is not lifestyle limiting.  At this point, we will continue to monitor him noninvasively on annual basis.  Hyperlipidemia History of hyperlipidemia on high-dose atorvastatin and fenofibrate with lipid profile performed 03/02/2021 revealing total cholesterol 133, LDL 68 and HDL  30.  Bicuspid aortic valve History of bicuspid aortic valve with mild aortic stenosis by 2D echocardiogram performed 03/13/2021.  This will be repeated this August.  Aortic aneurysm, thoracic History of mild dilatation of the ascending thoracic aorta measuring 40 mm at the time of echo in August of last year.  This will be repeated this coming August.  Paroxysmal atrial fibrillation ([place]) History of PAF maintaining sinus rhythm now on Eliquis oral anticoagulation.  He was scheduled for DC cardioversion with Dr. Stanford Breed but converted spontaneously.  He does feel somewhat clinically improved.  CAD (coronary artery disease) History of CAD status post attempt at diagonal branch orbital atherectomy and stenting 02/08/2021.  This is  complicated by a small coronary perforation with some mild contrast extravasation.  The procedure was abandoned.  He did not end up having pericardial tamponade.  He currently denies chest pain.     Lorretta Harp MD FACP,FACC,FAHA, [facility] 01/03/2022 9:27 AM

## 2022-01-10 ENCOUNTER — Telehealth: Payer: HMO

## 2022-01-11 ENCOUNTER — Other Ambulatory Visit: Payer: Self-pay

## 2022-01-11 ENCOUNTER — Encounter: Payer: Self-pay | Admitting: *Deleted

## 2022-01-11 DIAGNOSIS — I10 Essential (primary) hypertension: Secondary | ICD-10-CM

## 2022-01-11 MED ORDER — APIXABAN 5 MG PO TABS: 5.0000 mg | ORAL_TABLET | Freq: Two times a day (BID) | ORAL | 0 refills | Status: DC

## 2022-01-11 NOTE — Progress Notes (Signed)
Per Dr. Benay Spice, needs 30 minute office visit in 3 weeks with him or NP. No lab. Scheduling message sent.

## 2022-01-15 ENCOUNTER — Ambulatory Visit (HOSPITAL_COMMUNITY): Payer: HMO | Admitting: Physician Assistant

## 2022-01-17 ENCOUNTER — Encounter: Payer: Self-pay | Admitting: Family Medicine

## 2022-01-18 ENCOUNTER — Ambulatory Visit: Payer: HMO | Admitting: Oncology

## 2022-01-21 ENCOUNTER — Telehealth: Payer: Self-pay

## 2022-01-21 ENCOUNTER — Ambulatory Visit: Payer: HMO | Admitting: Physician Assistant

## 2022-01-21 NOTE — Telephone Encounter (Signed)
Please can you write a Rx for Jardiance for pt, so that I can fax it to the pt assistance program.   [place].

## 2022-01-23 ENCOUNTER — Other Ambulatory Visit: Payer: Self-pay

## 2022-01-23 DIAGNOSIS — I10 Essential (primary) hypertension: Secondary | ICD-10-CM

## 2022-01-23 MED ORDER — APIXABAN 5 MG PO TABS: 5.0000 mg | ORAL_TABLET | Freq: Two times a day (BID) | ORAL | 3 refills | Status: DC

## 2022-01-25 ENCOUNTER — Encounter: Payer: Self-pay | Admitting: Oncology

## 2022-01-25 ENCOUNTER — Inpatient Hospital Stay: Payer: HMO | Attending: Oncology | Admitting: Oncology

## 2022-01-25 VITALS — BP 120/66 | HR 60 | Temp 98.2°F | Resp 20 | Ht 70.0 in | Wt 232.0 lb

## 2022-01-25 DIAGNOSIS — I129 Hypertensive chronic kidney disease with stage 1 through stage 4 chronic kidney disease, or unspecified chronic kidney disease: Secondary | ICD-10-CM | POA: Insufficient documentation

## 2022-01-25 DIAGNOSIS — Z79899 Other long term (current) drug therapy: Secondary | ICD-10-CM | POA: Insufficient documentation

## 2022-01-25 DIAGNOSIS — Z5112 Encounter for antineoplastic immunotherapy: Secondary | ICD-10-CM | POA: Diagnosis not present

## 2022-01-25 DIAGNOSIS — N189 Chronic kidney disease, unspecified: Secondary | ICD-10-CM | POA: Diagnosis not present

## 2022-01-25 DIAGNOSIS — E1122 Type 2 diabetes mellitus with diabetic chronic kidney disease: Secondary | ICD-10-CM | POA: Insufficient documentation

## 2022-01-25 DIAGNOSIS — C220 Liver cell carcinoma: Secondary | ICD-10-CM | POA: Diagnosis not present

## 2022-01-25 DIAGNOSIS — K5190 Ulcerative colitis, unspecified, without complications: Secondary | ICD-10-CM | POA: Insufficient documentation

## 2022-01-25 DIAGNOSIS — I2510 Atherosclerotic heart disease of native coronary artery without angina pectoris: Secondary | ICD-10-CM | POA: Insufficient documentation

## 2022-01-25 DIAGNOSIS — I739 Peripheral vascular disease, unspecified: Secondary | ICD-10-CM | POA: Diagnosis not present

## 2022-01-25 NOTE — Progress Notes (Deleted)
[facility] OFFICE PROGRESS NOTE   Diagnosis:   INTERVAL HISTORY:   ***  Objective:  Vital signs in last 24 hours:  Blood pressure 120/66, pulse 60, temperature 98.2 F (36.8 C), temperature source Oral, resp. rate 20, height 5' 10"  (1.778 m), weight 232 lb (105.2 kg), SpO2 99 %.    HEENT: *** Lymphatics: *** Resp: *** Cardio: *** GI: *** Vascular: *** Neuro:***  Skin:***   Portacath/PICC-without erythema  Lab Results:  Lab Results  Component Value Date   WBC 4.3 12/13/2021   HGB 12.1 (L) 12/13/2021   HCT 38.9 12/13/2021   MCV 89.8 12/13/2021   PLT 313 12/13/2021   NEUTROABS 6.0 06/21/2021    CMP  Lab Results  Component Value Date   NA 139 12/13/2021   K 4.8 12/13/2021   CL 104 12/13/2021   CO2 25 12/13/2021   GLUCOSE 87 12/13/2021   BUN 28 (H) 12/13/2021   CREATININE 2.16 (H) 12/13/2021   CALCIUM 9.1 12/13/2021   PROT 6.9 12/13/2021   ALBUMIN 3.8 06/21/2021   AST 24 12/13/2021   ALT 15 12/13/2021   ALKPHOS 55 06/21/2021   BILITOT 0.5 12/13/2021   GFRNONAA 42 (L) 06/21/2021   GFRAA 49 (L) 11/20/2020    Lab Results  Component Value Date   CEA1 1.9 06/21/2021    Medications: I have reviewed the patient's current medications.   Assessment/Plan: Hepatocellular carcinoma CT angio chest aorta 01/30/2021-no evidence of thoracic aortic aneurysm, diffuse hepatic steatosis, suggestion of early cirrhosis, 7.1 cm enhancing masslike area in segment 5/6, prominent gastrohepatic and periportal nodes measuring up to 7 mm MRI liver 03/22/2021-arterial hyperenhancing subcapsular mass, segment 6, 7 x 5.1 cm, LI-RADS 5, no pathologically large lymph nodes Ultrasound-guided biopsy 04/24/2021-hepatocellular carcinoma Y90 radioembolization of segment 6 and segment 7 hepatic arterial branches 06/21/2021 MRI abdomen 09/03/2021-new increased enhancement inferior and superior to the dominant right liver lesion, new 11 mm enhancing lesion in the left liver  LIRADS 3, dominant right hepatic lesion stable in size MRI abdomen 12/19/2021-unchanged hyperenhancing mass in the anterior inferior right liver measuring 7.8 x 5 cm, interval enlargement of a arterial enhancing lesion in segment 2- LIRADS 5, new small enhancing lesions hepatic segment 4A measuring 1.2 x 0.9 cm and 0.8 x 0.6 cm- LIRADS 5 Ulcerative colitis-maintained on vedolizumab Peripheral arterial disease History of a thoracic aortic aneurysm CAD Diabetes Hypertension Chronic renal failure    Disposition: Mr Bann has hepatocellular carcinoma.  He underwent Y90 embolization of a segment 6 lesion in November 2022.  A repeat MRI 12/19/2021 reveals evidence of persistent viable tumor in the dominant segment 6 lesion, and new multifocal LIRADS lesions.  He is asymptomatic.  He did not have varices on an upper endoscopy 12/01/2020.  I discussed treatment options with Mr. Rostad and his wife.  He does not appear to be a surgical candidate.  We discussed systemic treatment options and additional hepatic directed therapies.  I will present his case at the GI tumor conference on 01/30/2022.  We discussed systemic treatment with atezolizumab/bevacizumab.  I reviewed potential toxicities associated with bevacizumab including the chance of hypertension, bleeding, thromboembolic disease, bowel perforation, delayed wound healing, nephrotoxicity, and CNS toxicity.  We discussed the rash, diarrhea, and various autoimmune toxicities associated with atezolizumab.   We discussed combining systemic therapy with hepatic directed therapy.  He will return for an office visit on 02/01/2022 to make a treatment plan.     Betsy Coder, MD  01/25/2022  12:08 PM

## 2022-01-25 NOTE — Progress Notes (Signed)
Matthew Chen OFFICE PROGRESS NOTE   Diagnosis: Hepatocellular carcinoma  INTERVAL HISTORY:   Matthew Chen returns as scheduled.  He feels well.  No complaint.  He is here today with his wife.  He underwent Y90 embolization of the segment 6 liver lesion in November.  He recently had a restaging MRI of the liver.  This confirmed persistent disease in segment 6 and multiple new enhancing lesions.  He is referred to consider treatment options.  Objective:  Vital signs in last 24 hours:  Blood pressure 120/66, pulse 60, temperature 98.2 F (36.8 C), temperature source Oral, resp. rate 20, height 5' 10"  (1.778 m), weight 232 lb (105.2 kg), SpO2 99 %.   Resp: Lungs clear bilaterally Cardio: Regular rate and rhythm GI: No hepatosplenomegaly, no mass, nontender Vascular: No leg edema   Lab Results:  Lab Results  Component Value Date   WBC 4.3 12/13/2021   HGB 12.1 (L) 12/13/2021   HCT 38.9 12/13/2021   MCV 89.8 12/13/2021   PLT 313 12/13/2021   NEUTROABS 6.0 06/21/2021    CMP  Lab Results  Component Value Date   NA 139 12/13/2021   K 4.8 12/13/2021   CL 104 12/13/2021   CO2 25 12/13/2021   GLUCOSE 87 12/13/2021   BUN 28 (H) 12/13/2021   CREATININE 2.16 (H) 12/13/2021   CALCIUM 9.1 12/13/2021   PROT 6.9 12/13/2021   ALBUMIN 3.8 06/21/2021   AST 24 12/13/2021   ALT 15 12/13/2021   ALKPHOS 55 06/21/2021   BILITOT 0.5 12/13/2021   GFRNONAA 42 (L) 06/21/2021   GFRAA 49 (L) 11/20/2020    Lab Results  Component Value Date   CEA1 1.9 06/21/2021    Medications: I have reviewed the patient's current medications.   Assessment/Plan: Hepatocellular carcinoma CT angio chest aorta 01/30/2021-no evidence of thoracic aortic aneurysm, diffuse hepatic steatosis, suggestion of early cirrhosis, 7.1 cm enhancing masslike area in segment 5/6, prominent gastrohepatic and periportal nodes measuring up to 7 mm MRI liver 03/22/2021-arterial hyperenhancing subcapsular mass,  segment 6, 7 x 5.1 cm, LI-RADS 5, no pathologically large lymph nodes Ultrasound-guided biopsy 04/24/2021-hepatocellular carcinoma Y90 radioembolization of segment 6 and segment 7 hepatic arterial branches 06/21/2021 MRI abdomen 09/03/2021-new increased enhancement inferior and superior to the dominant right liver lesion, new 11 mm enhancing lesion in the left liver LIRADS 3, dominant right hepatic lesion stable in size MRI abdomen 12/19/2021-unchanged hyperenhancing mass in the anterior inferior right liver measuring 7.8 x 5 cm, interval enlargement of a arterial enhancing lesion in segment 2- LIRADS 5, new small enhancing lesions hepatic segment 4A measuring 1.2 x 0.9 cm and 0.8 x 0.6 cm- LIRADS 5 Ulcerative colitis-maintained on vedolizumab Peripheral arterial disease History of a thoracic aortic aneurysm CAD Diabetes Hypertension Chronic renal failure       Disposition: Matthew Chen has hepatocellular carcinoma.  He underwent Y90 embolization of a segment 6 lesion in November 2022.  A repeat MRI 12/19/2021 reveals evidence of persistent viable tumor in the dominant segment 6 lesion, and new multifocal LIRADS lesions.  He is asymptomatic.  He did not have varices on an upper endoscopy 12/01/2020.  I discussed treatment options with Matthew Chen and his wife.  He does not appear to be a surgical candidate.  We discussed systemic treatment options and additional hepatic directed therapies.  I will present his case at the GI tumor conference on 01/30/2022.  We discussed systemic treatment with atezolizumab/bevacizumab.  I reviewed potential toxicities associated with bevacizumab including  the chance of hypertension, bleeding, thromboembolic disease, bowel perforation, delayed wound healing, nephrotoxicity, and CNS toxicity.  We discussed the rash, diarrhea, and various autoimmune toxicities associated with atezolizumab.    We discussed combining systemic therapy with hepatic directed therapy.  He will  return for an office visit on 02/01/2022 to make a treatment plan.      Betsy Coder, MD  01/25/2022  3:33 PM

## 2022-01-26 ENCOUNTER — Other Ambulatory Visit: Payer: Self-pay | Admitting: Family Medicine

## 2022-01-26 DIAGNOSIS — E785 Hyperlipidemia, unspecified: Secondary | ICD-10-CM

## 2022-01-26 DIAGNOSIS — I10 Essential (primary) hypertension: Secondary | ICD-10-CM

## 2022-01-28 ENCOUNTER — Other Ambulatory Visit (HOSPITAL_COMMUNITY): Payer: Self-pay | Admitting: Cardiovascular Disease

## 2022-01-28 DIAGNOSIS — Z1159 Encounter for screening for other viral diseases: Secondary | ICD-10-CM | POA: Diagnosis not present

## 2022-01-28 DIAGNOSIS — Z959 Presence of cardiac and vascular implant and graft, unspecified: Secondary | ICD-10-CM

## 2022-01-28 DIAGNOSIS — K5190 Ulcerative colitis, unspecified, without complications: Secondary | ICD-10-CM | POA: Diagnosis not present

## 2022-01-28 DIAGNOSIS — I739 Peripheral vascular disease, unspecified: Secondary | ICD-10-CM

## 2022-01-28 NOTE — Telephone Encounter (Signed)
Requested medications are due for refill today.  yes  Requested medications are on the active medications list.  yes  Last refill. Lisinopril - HCTZ 90/2/ 03/19/2021, Carvedilol 12/202/2022 #180 5 refills  Future visit scheduled.   no  Notes to clinic.  Medication refill failed protocol d/t expired labs.    Requested Prescriptions  Pending Prescriptions Disp Refills   lisinopril-hydrochlorothiazide (ZESTORETIC) 20-25 MG tablet [Pharmacy Med Name: LISINOPRIL-HCTZ 20-25 MG TAB] 90 tablet 2    Sig: TAKE 1 TABLET BY MOUTH EVERY DAY     Cardiovascular:  ACEI + Diuretic Combos Failed - 01/28/2022 10:03 AM      Failed - Cr in normal range and within 180 days    Creat  Date Value Ref Range Status  12/13/2021 2.16 (H) 0.70 - 1.35 mg/dL Final         Failed - Valid encounter within last 6 months    Recent Outpatient Visits           1 year ago Type 2 diabetes mellitus with other specified complication, unspecified whether long term insulin use ([place])   [place] Pickard, Cammie Mcgee, MD   1 year ago Type 2 diabetes mellitus with other specified complication, unspecified whether long term insulin use ([place])   [place] Pickard, Cammie Mcgee, MD   2 years ago Benign essential HTN   [place] Susy Frizzle, MD   2 years ago Benign essential HTN   [place] Susy Frizzle, MD   2 years ago Benign essential HTN   [place] Pickard, Cammie Mcgee, MD       Future Appointments             In 4 months [place], [place], [place] [facility], [facility]            Passed - Na in normal range and within 180 days    Sodium  Date Value Ref Range Status  12/13/2021 139 135 - 146 mmol/L Final  02/13/2021 140 134 - 144 mmol/L Final         Passed - K in normal range and within 180 days    Potassium  Date Value Ref Range Status  12/13/2021 4.8 3.5 - 5.3 mmol/L Final         Passed - eGFR is 30 or  above and within 180 days    GFR, Est African American  Date Value Ref Range Status  11/20/2020 49 (L) > OR = 60 mL/min/1.16m Final   GFR, Est Non African American  Date Value Ref Range Status  11/20/2020 42 (L) > OR = 60 mL/min/1.746mFinal   GFR, Estimated  Date Value Ref Range Status  06/21/2021 42 (L) >60 mL/min Final    Comment:    (NOTE) Calculated using the CKD-EPI Creatinine Equation (2021)    GFR  Date Value Ref Range Status  04/17/2021 34.75 (L) >60.00 mL/min Final    Comment:    Calculated using the CKD-EPI Creatinine Equation (2021)   eGFR  Date Value Ref Range Status  12/13/2021 33 (L) > OR = 60 mL/min/1.7378minal    Comment:    The eGFR is based on the CKD-EPI 2021 equation. To calculate  the new eGFR from a previous Creatinine or Cystatin C result, go to https://www.kidney.org/professionals/ kdoqi/gfr%5Fcalculator   02/13/2021 43 (L) >59 mL/min/1.73 Final         Passed - Patient is not pregnant  Passed - Last BP in normal range    BP Readings from Last 1 Encounters:  01/25/22 120/66          carvedilol (COREG) 12.5 MG tablet [Pharmacy Med Name: CARVEDILOL 12.5 MG TABLET] 180 tablet 5    Sig: Take 3 tablets (37.5 mg total) by mouth 2 (two) times daily.     Cardiovascular: Beta Blockers 3 Failed - 01/28/2022 10:03 AM      Failed - Cr in normal range and within 360 days    Creat  Date Value Ref Range Status  12/13/2021 2.16 (H) 0.70 - 1.35 mg/dL Final         Failed - Valid encounter within last 6 months    Recent Outpatient Visits           1 year ago Type 2 diabetes mellitus with other specified complication, unspecified whether long term insulin use ([place])   [place] Pickard, Cammie Mcgee, MD   1 year ago Type 2 diabetes mellitus with other specified complication, unspecified whether long term insulin use ([place])   [place] Susy Frizzle, MD   2 years ago Benign essential HTN   [place] Susy Frizzle, MD   2 years ago Benign essential HTN   [place] Susy Frizzle, MD   2 years ago Benign essential HTN   [place] Pickard, Cammie Mcgee, MD       Future Appointments             In 4 months [place], [place], [place] [facility], [facility]            Passed - AST in normal range and within 360 days    AST  Date Value Ref Range Status  12/13/2021 24 10 - 35 U/L Final         Passed - ALT in normal range and within 360 days    ALT  Date Value Ref Range Status  12/13/2021 15 9 - 46 U/L Final         Passed - Last BP in normal range    BP Readings from Last 1 Encounters:  01/25/22 120/66         Passed - Last Heart Rate in normal range    Pulse Readings from Last 1 Encounters:  01/25/22 60

## 2022-01-30 ENCOUNTER — Other Ambulatory Visit: Payer: Self-pay

## 2022-01-30 NOTE — Progress Notes (Signed)
The proposed treatment discussed in conference is for discussion purpose only and is not a binding recommendation.  The patients have not been physically examined, or presented with their treatment options.  Therefore, final treatment plans cannot be decided.  

## 2022-02-01 ENCOUNTER — Inpatient Hospital Stay: Payer: HMO | Admitting: Nurse Practitioner

## 2022-02-01 ENCOUNTER — Encounter: Payer: Self-pay | Admitting: Nurse Practitioner

## 2022-02-01 VITALS — BP 110/65 | HR 60 | Temp 98.2°F | Resp 18 | Ht 70.0 in | Wt 236.4 lb

## 2022-02-01 DIAGNOSIS — C220 Liver cell carcinoma: Secondary | ICD-10-CM

## 2022-02-01 DIAGNOSIS — Z5112 Encounter for antineoplastic immunotherapy: Secondary | ICD-10-CM | POA: Diagnosis not present

## 2022-02-03 ENCOUNTER — Other Ambulatory Visit: Payer: Self-pay | Admitting: Oncology

## 2022-02-04 ENCOUNTER — Inpatient Hospital Stay: Payer: HMO

## 2022-02-04 ENCOUNTER — Encounter: Payer: Self-pay | Admitting: *Deleted

## 2022-02-04 ENCOUNTER — Other Ambulatory Visit: Payer: Self-pay | Admitting: Family Medicine

## 2022-02-04 DIAGNOSIS — E785 Hyperlipidemia, unspecified: Secondary | ICD-10-CM

## 2022-02-04 DIAGNOSIS — C220 Liver cell carcinoma: Secondary | ICD-10-CM

## 2022-02-04 DIAGNOSIS — Z5112 Encounter for antineoplastic immunotherapy: Secondary | ICD-10-CM | POA: Diagnosis not present

## 2022-02-04 DIAGNOSIS — I10 Essential (primary) hypertension: Secondary | ICD-10-CM

## 2022-02-04 LAB — CMP (CANCER CENTER ONLY)
ALT: 19 U/L (ref 0–44)
AST: 25 U/L (ref 15–41)
Albumin: 4.1 g/dL (ref 3.5–5.0)
Alkaline Phosphatase: 53 U/L (ref 38–126)
Anion gap: 7 (ref 5–15)
BUN: 31 mg/dL — ABNORMAL HIGH (ref 8–23)
CO2: 26 mmol/L (ref 22–32)
Calcium: 9.6 mg/dL (ref 8.9–10.3)
Chloride: 104 mmol/L (ref 98–111)
Creatinine: 2.14 mg/dL — ABNORMAL HIGH (ref 0.61–1.24)
GFR, Estimated: 33 mL/min — ABNORMAL LOW (ref >60)
Glucose, Bld: 121 mg/dL — ABNORMAL HIGH (ref 70–99)
Potassium: 4.1 mmol/L (ref 3.5–5.1)
Sodium: 137 mmol/L (ref 135–145)
Total Bilirubin: 0.6 mg/dL (ref 0.3–1.2)
Total Protein: 7 g/dL (ref 6.5–8.1)

## 2022-02-04 LAB — CBC WITH DIFFERENTIAL (CANCER CENTER ONLY)
Abs Immature Granulocytes: 0.02 10*3/uL (ref 0.00–0.07)
Basophils Absolute: 0 10*3/uL (ref 0.0–0.1)
Basophils Relative: 1 %
Eosinophils Absolute: 0.2 10*3/uL (ref 0.0–0.5)
Eosinophils Relative: 4 %
HCT: 34.6 % — ABNORMAL LOW (ref 39.0–52.0)
Hemoglobin: 10.7 g/dL — ABNORMAL LOW (ref 13.0–17.0)
Immature Granulocytes: 0 %
Lymphocytes Relative: 21 %
Lymphs Abs: 1 10*3/uL (ref 0.7–4.0)
MCH: 27.2 pg (ref 26.0–34.0)
MCHC: 30.9 g/dL (ref 30.0–36.0)
MCV: 87.8 fL (ref 80.0–100.0)
Monocytes Absolute: 0.7 10*3/uL (ref 0.1–1.0)
Monocytes Relative: 14 %
Neutro Abs: 2.9 10*3/uL (ref 1.7–7.7)
Neutrophils Relative %: 60 %
Platelet Count: 247 10*3/uL (ref 150–400)
RBC: 3.94 MIL/uL — ABNORMAL LOW (ref 4.22–5.81)
RDW: 17.9 % — ABNORMAL HIGH (ref 11.5–15.5)
WBC Count: 4.8 10*3/uL (ref 4.0–10.5)
nRBC: 0 % (ref 0.0–0.2)

## 2022-02-04 LAB — TSH: TSH: 1.085 u[IU]/mL (ref 0.350–4.500)

## 2022-02-04 LAB — TOTAL PROTEIN, URINE DIPSTICK: Protein, ur: NEGATIVE mg/dL

## 2022-02-04 MED ORDER — PROCHLORPERAZINE MALEATE 10 MG PO TABS: 10.0000 mg | ORAL_TABLET | Freq: Four times a day (QID) | ORAL | 1 refills | Status: DC | PRN

## 2022-02-05 ENCOUNTER — Other Ambulatory Visit: Payer: Self-pay | Admitting: *Deleted

## 2022-02-05 ENCOUNTER — Other Ambulatory Visit: Payer: Self-pay

## 2022-02-05 DIAGNOSIS — I10 Essential (primary) hypertension: Secondary | ICD-10-CM

## 2022-02-05 DIAGNOSIS — E785 Hyperlipidemia, unspecified: Secondary | ICD-10-CM

## 2022-02-05 DIAGNOSIS — C220 Liver cell carcinoma: Secondary | ICD-10-CM

## 2022-02-05 NOTE — Telephone Encounter (Signed)
I called [facility] [phone] to see why we were getting duplicate requests for Coreg 12.5 mg, Fenofibrate 160 mg, and Zestoretic 20-25 mg.  The Fenofibrate was received by them this morning for #90, 0 refills.  No more refills on it.  The Coreg and Zestoretic have no refills remaining so need new prescriptions sent in.   They got requests for these 2 medications on 01/29/2022 but because there were no refills remaining they were not filled.  I have sent the requests to [facility] for new prescriptions to be sent in for the Zestoretic and Coreg.

## 2022-02-06 ENCOUNTER — Telehealth: Payer: Self-pay

## 2022-02-06 NOTE — Telephone Encounter (Signed)
Fax received from [facility] Patient assistance Program stating they can not processes the application due an error w/Rx.   Call [facility] need a new script.  Will call them tom.

## 2022-02-07 ENCOUNTER — Other Ambulatory Visit: Payer: Self-pay | Admitting: Family Medicine

## 2022-02-07 ENCOUNTER — Encounter: Payer: Self-pay | Admitting: *Deleted

## 2022-02-07 ENCOUNTER — Inpatient Hospital Stay: Payer: HMO

## 2022-02-07 ENCOUNTER — Other Ambulatory Visit: Payer: Self-pay

## 2022-02-07 VITALS — BP 122/68 | HR 60 | Temp 97.5°F | Resp 16 | Wt 234.6 lb

## 2022-02-07 DIAGNOSIS — E785 Hyperlipidemia, unspecified: Secondary | ICD-10-CM

## 2022-02-07 DIAGNOSIS — I10 Essential (primary) hypertension: Secondary | ICD-10-CM

## 2022-02-07 DIAGNOSIS — C220 Liver cell carcinoma: Secondary | ICD-10-CM

## 2022-02-07 DIAGNOSIS — Z5112 Encounter for antineoplastic immunotherapy: Secondary | ICD-10-CM | POA: Diagnosis not present

## 2022-02-07 MED ORDER — SODIUM CHLORIDE 0.9 % IV SOLN: Freq: Once | INTRAVENOUS | Status: AC

## 2022-02-07 MED ORDER — SODIUM CHLORIDE 0.9 % IV SOLN
1200.0000 mg | Freq: Once | INTRAVENOUS | Status: AC
  Administered 2022-02-07: 1200 mg via INTRAVENOUS
  Filled 2022-02-07: qty 20

## 2022-02-07 MED ORDER — SODIUM CHLORIDE 0.9 % IV SOLN
15.0000 mg/kg | Freq: Once | INTRAVENOUS | Status: AC
  Administered 2022-02-07: 1600 mg via INTRAVENOUS
  Filled 2022-02-07: qty 64

## 2022-02-07 NOTE — Telephone Encounter (Signed)
Pt called in inquiring about his refill request for:  atorvastatin (LIPITOR) 80 MG  fenofibrate 160 MG tablet  carvedilol (COREG) 12.5 MG   Pt states that he needs these for his blood pressure and will be out soon. Please advise.  Cb#: [phone]

## 2022-02-07 NOTE — Telephone Encounter (Signed)
Pt has appt upcoming on 02/18/22. rx was sent to pharmacy on 01/29/22 #30/0 Requested Prescriptions  Pending Prescriptions Disp Refills  . carvedilol (COREG) 12.5 MG tablet [Pharmacy Med Name: CARVEDILOL 12.5 MG TABLET] 30 tablet 0    Sig: TAKE 3 TABLETS (37.5 MG TOTAL) BY MOUTH 2 (TWO) TIMES DAILY.**NEED OFFICE VISIT**     Cardiovascular: Beta Blockers 3 Failed - 02/07/2022  5:34 PM      Failed - Cr in normal range and within 360 days    Creatinine  Date Value Ref Range Status  02/04/2022 2.14 (H) 0.61 - 1.24 mg/dL Final   Creat  Date Value Ref Range Status  12/13/2021 2.16 (H) 0.70 - 1.35 mg/dL Final         Failed - Valid encounter within last 6 months    Recent Outpatient Visits          1 year ago Type 2 diabetes mellitus with other specified complication, unspecified whether long term insulin use ([place])   [place] Pickard, Cammie Mcgee, MD   1 year ago Type 2 diabetes mellitus with other specified complication, unspecified whether long term insulin use ([place])   [place] Pickard, Cammie Mcgee, MD   2 years ago Benign essential HTN   [place] Dennard Schaumann, Cammie Mcgee, MD   2 years ago Benign essential HTN   [place] Susy Frizzle, MD   2 years ago Benign essential HTN   [place], Cammie Mcgee, MD      Future Appointments            In 1 week Pickard, Cammie Mcgee, MD [place], [facility]   In 4 months [place], [place], [place] [facility], [facility]           Passed - AST in normal range and within 360 days    AST  Date Value Ref Range Status  02/04/2022 25 15 - 41 U/L Final         Passed - ALT in normal range and within 360 days    ALT  Date Value Ref Range Status  02/04/2022 19 0 - 44 U/L Final         Passed - Last BP in normal range    BP Readings from Last 1 Encounters:  02/07/22 122/68         Passed - Last Heart Rate in normal range    Pulse Readings  from Last 1 Encounters:  02/07/22 60

## 2022-02-07 NOTE — Progress Notes (Signed)
Pharmacist Chemotherapy Monitoring - Initial Assessment    Anticipated start date: 02/07/22   The following has been reviewed per standard work regarding the patient's treatment regimen: The patient's diagnosis, treatment plan and drug doses, and organ/hematologic function Lab orders and baseline tests specific to treatment regimen  The treatment plan start date, drug sequencing, and pre-medications Prior authorization status  Patient's documented medication list, including drug-drug interaction screen and prescriptions for anti-emetics and supportive care specific to the treatment regimen The drug concentrations, fluid compatibility, administration routes, and timing of the medications to be used The patient's access for treatment and lifetime cumulative dose history, if applicable  The patient's medication allergies and previous infusion related reactions, if applicable   Changes made to treatment plan:  N/A  Follow up needed:  N/A   Patrica Duel, [facility], 02/07/2022  8:36 AM

## 2022-02-07 NOTE — Patient Instructions (Signed)
Atezolizumab injection What is this medication? ATEZOLIZUMAB (a te zoe LIZ ue mab) is a monoclonal antibody. It is used to treat bladder cancer (urothelial cancer), liver cancer, lung cancer, and melanoma. This medicine may be used for other purposes; ask your health care provider or pharmacist if you have questions. COMMON BRAND NAME(S): Tecentriq What should I tell my care team before I take this medication? They need to know if you have any of these conditions: autoimmune diseases like Crohn's disease, ulcerative colitis, or lupus have had or planning to have an allogeneic stem cell transplant (uses someone else's stem cells) history of organ transplant history of radiation to the chest nervous system problems like myasthenia gravis or Guillain-Barre syndrome an unusual or allergic reaction to atezolizumab, other medicines, foods, dyes, or preservatives pregnant or trying to get pregnant breast-feeding How should I use this medication? This medicine is for infusion into a vein. It is given by a health care professional in a hospital or clinic setting. A special MedGuide will be given to you before each treatment. Be sure to read this information carefully each time. Talk to your pediatrician regarding the use of this medicine in children. Special care may be needed. Overdosage: If you think you have taken too much of this medicine contact a poison control center or emergency room at once. NOTE: This medicine is only for you. Do not share this medicine with others. What if I miss a dose? It is important not to miss your dose. Call your doctor or health care professional if you are unable to keep an appointment. What may interact with this medication? Interactions have not been studied. This list may not describe all possible interactions. Give your health care provider a list of all the medicines, herbs, non-prescription drugs, or dietary supplements you use. Also tell them if you smoke,  drink alcohol, or use illegal drugs. Some items may interact with your medicine. What should I watch for while using this medication? Your condition will be monitored carefully while you are receiving this medicine. You may need blood work done while you are taking this medicine. Do not become pregnant while taking this medicine or for at least 5 months after stopping it. Women should inform their doctor if they wish to become pregnant or think they might be pregnant. There is a potential for serious side effects to an unborn child. Talk to your health care professional or pharmacist for more information. Do not breast-feed an infant while taking this medicine or for at least 5 months after the last dose. What side effects may I notice from receiving this medication? Side effects that you should report to your doctor or health care professional as soon as possible: allergic reactions like skin rash, itching or hives, swelling of the face, lips, or tongue black, tarry stools bloody or watery diarrhea breathing problems changes in vision chest pain or chest tightness chills facial flushing fever headache signs and symptoms of high blood sugar such as dizziness; dry mouth; dry skin; fruity breath; nausea; stomach pain; increased hunger or thirst; increased urination signs and symptoms of liver injury like dark yellow or brown urine; general ill feeling or flu-like symptoms; light-colored stools; loss of appetite; nausea; right upper belly pain; unusually weak or tired; yellowing of the eyes or skin stomach pain trouble passing urine or change in the amount of urine Side effects that usually do not require medical attention (report to your doctor or health care professional if they continue or are  bothersome): bone pain cough diarrhea joint pain muscle pain muscle weakness swelling of arms or legs tiredness weight loss This list may not describe all possible side effects. Call your doctor  for medical advice about side effects. You may report side effects to FDA at 1-800-FDA-1088. Where should I keep my medication? This drug is given in a hospital or clinic and will not be stored at home. NOTE: This sheet is a summary. It may not cover all possible information. If you have questions about this medicine, talk to your doctor, pharmacist, or health care provider.   2023 Elsevier/Gold Standard (2021-06-29 00:00:00)

## 2022-02-07 NOTE — Progress Notes (Signed)
Dr. Benay Spice and Dr. 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 Patient has been notified.

## 2022-02-08 ENCOUNTER — Other Ambulatory Visit: Payer: HMO

## 2022-02-08 ENCOUNTER — Telehealth: Payer: Self-pay

## 2022-02-08 DIAGNOSIS — I2510 Atherosclerotic heart disease of native coronary artery without angina pectoris: Secondary | ICD-10-CM | POA: Diagnosis not present

## 2022-02-08 DIAGNOSIS — Z794 Long term (current) use of insulin: Secondary | ICD-10-CM | POA: Diagnosis not present

## 2022-02-08 DIAGNOSIS — E118 Type 2 diabetes mellitus with unspecified complications: Secondary | ICD-10-CM

## 2022-02-08 DIAGNOSIS — Z125 Encounter for screening for malignant neoplasm of prostate: Secondary | ICD-10-CM | POA: Diagnosis not present

## 2022-02-08 NOTE — Telephone Encounter (Signed)
rx for fenofibrate and carvedilol was sent on 01/29/22. Pt has refills at [facility] for at[facility]vastatin that was sent 08/09/21 #90/3  Requested Prescriptions  Pending Prescriptions Disp Refills  . atorvastatin (LIPITOR) 80 MG tablet 90 tablet 3    Sig: Take 1 tablet (80 mg total) by mouth daily.     Cardiovascular:  Antilipid - Statins Failed - 02/08/2022 11:13 AM      Failed - Valid encounter within last 12 months    Recent Outpatient Visits          1 year ago Type 2 diabetes mellitus with other specified complication, unspecified whether long term insulin use ([place])   [place] Pickard, Cammie Mcgee, MD   1 year ago Type 2 diabetes mellitus with other specified complication, unspecified whether long term insulin use ([place])   [place] Pickard, Cammie Mcgee, MD   2 years ago Benign essential HTN   [place] Dennard Schaumann, Cammie Mcgee, MD   2 years ago Benign essential HTN   [place] Susy Frizzle, MD   2 years ago Benign essential HTN   [place], Cammie Mcgee, MD      Future Appointments            In 1 week [place] Schaumann, Cammie Mcgee, MD [place], [facility]   In 4 months [place], [place], [place] [facility], [facility]           Failed - Lipid Panel in normal range within the last 12 months    Cholesterol, Total  Date Value Ref Range Status  10/07/2016 119 100 - 199 mg/dL Final   Cholesterol  Date Value Ref Range Status  03/02/2021 133 <200 mg/dL Final   LDL Cholesterol (Calc)  Date Value Ref Range Status  03/02/2021 68 mg/dL (calc) Final    Comment:    Reference range: <100 . Desirable range <100 mg/dL for primary prevention;   <70 mg/dL for patients with CHD or diabetic patients  with > or = 2 CHD risk factors. Marland Kitchen LDL-C is now calculated using the Martin-Hopkins  calculation, which is a validated novel method providing  better accuracy than the Friedewald equation in the  estimation  of LDL-C.  Cresenciano Genre et al. [name]. 3748;270(78): 2061-2068  (http://education.QuestDiagnostics.com/faq/FAQ164)    HDL  Date Value Ref Range Status  03/02/2021 30 (L) > OR = 40 mg/dL Final  10/07/2016 26 (L) >39 mg/dL Final   Triglycerides  Date Value Ref Range Status  03/02/2021 266 (H) <150 mg/dL Final    Comment:    . If a non-fasting specimen was collected, consider repeat triglyceride testing on a fasting specimen if clinically indicated.  Yates Decamp et al. J. of Clin. Lipidol. 6754;4:920-100. Marland Kitchen          Passed - Patient is not pregnant      . fenofibrate 160 MG tablet 90 tablet 0    Sig: Take 1 tablet (160 mg total) by mouth daily.     Cardiovascular:  Antilipid - Fibric Acid Derivatives Failed - 02/08/2022 11:13 AM      Failed - Cr in normal range and within 360 days    Creatinine  Date Value Ref Range Status  02/04/2022 2.14 (H) 0.61 - 1.24 mg/dL Final   Creat  Date Value Ref Range Status  12/13/2021 2.16 (H) 0.70 - 1.35 mg/dL Final         Failed - HGB in normal  range and within 360 days    Hemoglobin  Date Value Ref Range Status  02/04/2022 10.7 (L) 13.0 - 17.0 g/dL Final  02/06/2021 12.6 (L) 13.0 - 17.7 g/dL Final         Failed - HCT in normal range and within 360 days    HCT  Date Value Ref Range Status  02/04/2022 34.6 (L) 39.0 - 52.0 % Final   Hematocrit  Date Value Ref Range Status  02/06/2021 37.9 37.5 - 51.0 % Final         Failed - Valid encounter within last 12 months    Recent Outpatient Visits          1 year ago Type 2 diabetes mellitus with other specified complication, unspecified whether long term insulin use ([place])   [place] Pickard, Cammie Mcgee, MD   1 year ago Type 2 diabetes mellitus with other specified complication, unspecified whether long term insulin use ([place])   [place] Pickard, Cammie Mcgee, MD   2 years ago Benign essential HTN   [place] Dennard Schaumann, Cammie Mcgee, MD   2  years ago Benign essential HTN   [place] Susy Frizzle, MD   2 years ago Benign essential HTN   [place], Cammie Mcgee, MD      Future Appointments            In 1 week [place]enn[place] Schaumann, Cammie Mcgee, MD [place], [facility]   In 4 months [place], [place], [place] [facility], [facility]           Failed - Lipid Panel in normal range within the last 12 months    Cholesterol, Total  Date Value Ref Range Status  10/07/2016 119 100 - 199 mg/dL Final   Cholesterol  Date Value Ref Range Status  03/02/2021 133 <200 mg/dL Final   LDL Cholesterol (Calc)  Date Value Ref Range Status  03/02/2021 68 mg/dL (calc) Final    Comment:    Reference range: <100 . Desirable range <100 mg/dL for primary prevention;   <70 mg/dL for patients with CHD or diabetic patients  with > or = 2 CHD risk factors. Marland Kitchen LDL-C is now calculated using the Martin-Hopkins  calculation, which is a validated novel method providing  better accuracy than the Friedewald equation in the  estimation of LDL-C.  Cresenciano Genre et al. [name]. 4196;222(97): 2061-2068  (http://education.[employer].com/faq/FAQ164)    HDL  Date Value Ref Range Status  03/02/2021 30 (L) > OR = 40 mg/dL Final  10/07/2016 26 (L) >39 mg/dL Final   Triglycerides  Date Value Ref Range Status  03/02/2021 266 (H) <150 mg/dL Final    Comment:    . If a non-fasting specimen was collected, consider repeat triglyceride testing on a fasting specimen if clinically indicated.  Yates Decamp et al. J. of Clin. Lipidol. 9892;1:194-174. Marland Kitchen          Passed - ALT in normal range and within 360 days    ALT  Date Value Ref Range Status  02/04/2022 19 0 - 44 U/L Final         Passed - AST in normal range and within 360 days    AST  Date Value Ref Range Status  02/04/2022 25 15 - 41 U/L Final         Passed - PLT in normal range and within 360 days    Platelets  Date Value  Ref Range Status   02/06/2021 259 150 - 450 x10E3/uL Final   Platelet Count  Date Value Ref Range Status  02/04/2022 247 150 - 400 K/uL Final         Passed - WBC in normal range and within 360 days    WBC  Date Value Ref Range Status  12/13/2021 4.3 3.8 - 10.8 Thousand/uL Final   WBC Count  Date Value Ref Range Status  02/04/2022 4.8 4.0 - 10.5 K/uL Final         Passed - eGFR is 30 or above and within 360 days    GFR, Est African American  Date Value Ref Range Status  11/20/2020 49 (L) > OR = 60 mL/min/1.42m Final   GFR, Est Non African American  Date Value Ref Range Status  11/20/2020 42 (L) > OR = 60 mL/min/1.730mFinal   GFR, Estimated  Date Value Ref Range Status  02/04/2022 33 (L) >60 mL/min Final    Comment:    (NOTE) Calculated using the CKD-EPI Creatinine Equation (2021)    GFR  Date Value Ref Range Status  04/17/2021 34.75 (L) >60.00 mL/min Final    Comment:    Calculated using the CKD-EPI Creatinine Equation (2021)   eGFR  Date Value Ref Range Status  12/13/2021 33 (L) > OR = 60 mL/min/1.7318minal    Comment:    The eGFR is based on the CKD-EPI 2021 equation. To calculate  the new eGFR from a previous Creatinine or Cystatin C result, go to https://www.kidney.org/professionals/ kdoqi/gfr%5Fcalculator   02/13/2021 43 (L) >59 mL/min/1.73 Final         . carvedilol (COREG) 12.5 MG tablet 30 tablet 0    Sig: Take 3 tablets (37.5 mg total) by mouth 2 (two) times daily.     Cardiovascular: Beta Blockers 3 Failed - 02/08/2022 11:13 AM      Failed - Cr in normal range and within 360 days    Creatinine  Date Value Ref Range Status  02/04/2022 2.14 (H) 0.61 - 1.24 mg/dL Final   Creat  Date Value Ref Range Status  12/13/2021 2.16 (H) 0.70 - 1.35 mg/dL Final         Failed - Valid encounter within last 6 months    Recent Outpatient Visits          1 year ago Type 2 diabetes mellitus with other specified complication, unspecified whether long term insulin use  ([place] Mcgee[place]   1 year ago Type 2 diabetes mellitus with other specified complication, unspecified whether long term insulin use ([place] BroRed[place]ckard, [place]ammie McgeeD   2 years ago Benign essential HTN   BroBaldwincSusy FrizzleD   2 years ago Benign essential HTN   BroChestercSusy FrizzleD   2 years ago Benign essential HTN   BroRochellearCammie McgeeD      Future Appointments            In 1 week PicDennard SchaumannarCammie McgeeD [facility]   In 4 months [place] [facility]           Passed - AST in normal range and within 360 days    AST  Date Value Ref Range Status  02/04/2022 25 15 - 41 U/L Final  Passed - ALT in normal range and within 360 days    ALT  Date Value Ref Range Status  02/04/2022 19 0 - 44 U/L Final         Passed - Last BP in normal range    BP Readings from Last 1 Encounters:  02/07/22 122/68         Passed - Last Heart Rate in normal range    Pulse Readings from Last 1 Encounters:  02/07/22 60

## 2022-02-08 NOTE — Telephone Encounter (Signed)
Called patient for first time chemo follow-up call.  Patient stated he has felt fine with no issues.  Denies nausea, troubles eating or drinking.  Patient's next appointment was confirmed and patient instructed to contact office with any questions or concerns.  Patient verbalized understanding.

## 2022-02-09 ENCOUNTER — Encounter: Payer: Self-pay | Admitting: Oncology

## 2022-02-09 LAB — COMPREHENSIVE METABOLIC PANEL
AG Ratio: 1.5 (calc) (ref 1.0–2.5)
ALT: 18 U/L (ref 9–46)
AST: 27 U/L (ref 10–35)
Albumin: 4.1 g/dL (ref 3.6–5.1)
Alkaline phosphatase (APISO): 56 U/L (ref 35–144)
BUN/Creatinine Ratio: 15 (calc) (ref 6–22)
BUN: 28 mg/dL — ABNORMAL HIGH (ref 7–25)
CO2: 25 mmol/L (ref 20–32)
Calcium: 9 mg/dL (ref 8.6–10.3)
Chloride: 107 mmol/L (ref 98–110)
Creat: 1.93 mg/dL — ABNORMAL HIGH (ref 0.70–1.35)
Globulin: 2.8 g/dL (calc) (ref 1.9–3.7)
Glucose, Bld: 95 mg/dL (ref 65–99)
Potassium: 4.5 mmol/L (ref 3.5–5.3)
Sodium: 139 mmol/L (ref 135–146)
Total Bilirubin: 0.6 mg/dL (ref 0.2–1.2)
Total Protein: 6.9 g/dL (ref 6.1–8.1)

## 2022-02-09 LAB — CBC WITH DIFFERENTIAL/PLATELET
Absolute Monocytes: 818 cells/uL (ref 200–950)
Basophils Absolute: 42 cells/uL (ref 0–200)
Basophils Relative: 0.9 %
Eosinophils Absolute: 193 cells/uL (ref 15–500)
Eosinophils Relative: 4.1 %
HCT: 35.8 % — ABNORMAL LOW (ref 38.5–50.0)
Hemoglobin: 11.4 g/dL — ABNORMAL LOW (ref 13.2–17.1)
Lymphs Abs: 766 cells/uL — ABNORMAL LOW (ref 850–3900)
MCH: 27.6 pg (ref 27.0–33.0)
MCHC: 31.8 g/dL — ABNORMAL LOW (ref 32.0–36.0)
MCV: 86.7 fL (ref 80.0–100.0)
MPV: 12.4 fL (ref 7.5–12.5)
Monocytes Relative: 17.4 %
Neutro Abs: 2881 cells/uL (ref 1500–7800)
Neutrophils Relative %: 61.3 %
Platelets: 248 10*3/uL (ref 140–400)
RBC: 4.13 10*6/uL — ABNORMAL LOW (ref 4.20–5.80)
RDW: 16.1 % — ABNORMAL HIGH (ref 11.0–15.0)
Total Lymphocyte: 16.3 %
WBC: 4.7 10*3/uL (ref 3.8–10.8)

## 2022-02-09 LAB — LIPID PANEL
Cholesterol: 95 mg/dL (ref <200)
HDL: 37 mg/dL — ABNORMAL LOW (ref >=40)
LDL Cholesterol (Calc): 37 mg/dL (calc)
Non-HDL Cholesterol (Calc): 58 mg/dL (calc) (ref <130)
Total CHOL/HDL Ratio: 2.6 (calc) (ref <5.0)
Triglycerides: 125 mg/dL (ref <150)

## 2022-02-09 LAB — HEMOGLOBIN A1C
Hgb A1c MFr Bld: 5.7 % of total Hgb — ABNORMAL HIGH (ref <5.7)
Mean Plasma Glucose: 117 mg/dL
eAG (mmol/L): 6.5 mmol/L

## 2022-02-11 ENCOUNTER — Other Ambulatory Visit: Payer: Self-pay | Admitting: Family Medicine

## 2022-02-11 DIAGNOSIS — I10 Essential (primary) hypertension: Secondary | ICD-10-CM

## 2022-02-11 NOTE — Telephone Encounter (Signed)
Requested medication (s) are due for refill today: yes  Requested medication (s) are on the active medication list: yes  Last refill:  03/19/2021 #60 with 2 RF  Future visit scheduled: 02/18/22  Notes to clinic:  Pt stated on 12/2021 that he does not take this med, please assess.      Requested Prescriptions  Pending Prescriptions Disp Refills   amLODipine (NORVASC) 5 MG tablet [Pharmacy Med Name: AMLODIPINE BESYLATE 5 MG TAB] 60 tablet 2    Sig: Take 1 tablet (5 mg total) by mouth daily.     Cardiovascular: Calcium Channel Blockers 2 Failed - 02/11/2022  2:08 AM      Failed - Valid encounter within last 6 months    Recent Outpatient Visits           1 year ago Type 2 diabetes mellitus with other specified complication, unspecified whether long term insulin use ([place])   [place] Pickard, Cammie Mcgee, MD   1 year ago Type 2 diabetes mellitus with other specified complication, unspecified whether long term insulin use ([place])   [place] Susy Frizzle, MD   2 years ago Benign essential HTN   [place] Susy Frizzle, MD   2 years ago Benign essential HTN   [place] Susy Frizzle, MD   2 years ago Benign essential HTN   [place], Cammie Mcgee, MD       Future Appointments             In 1 week Dennard Schaumann, Cammie Mcgee, MD [place], [facility]   In 3 months [place], [place], [place] [facility] [place], [place] BP in normal range    BP Readings from Last 1 Encounters:  02/07/22 122/68         Passed - Last Heart Rate in normal range    Pulse Readings from Last 1 Encounters:  02/07/22 60

## 2022-02-18 ENCOUNTER — Ambulatory Visit (INDEPENDENT_AMBULATORY_CARE_PROVIDER_SITE_OTHER): Payer: HMO | Admitting: Family Medicine

## 2022-02-18 VITALS — BP 122/72 | HR 77 | Temp 98.5°F | Ht 70.0 in | Wt 232.0 lb

## 2022-02-18 DIAGNOSIS — I480 Paroxysmal atrial fibrillation: Secondary | ICD-10-CM

## 2022-02-18 DIAGNOSIS — R5383 Other fatigue: Secondary | ICD-10-CM | POA: Diagnosis not present

## 2022-02-18 DIAGNOSIS — I10 Essential (primary) hypertension: Secondary | ICD-10-CM | POA: Diagnosis not present

## 2022-02-18 DIAGNOSIS — C220 Liver cell carcinoma: Secondary | ICD-10-CM

## 2022-02-18 MED ORDER — CARVEDILOL 12.5 MG PO TABS: 37.5000 mg | ORAL_TABLET | Freq: Two times a day (BID) | ORAL | 3 refills | Status: DC

## 2022-02-18 NOTE — Progress Notes (Signed)
Subjective:    Patient ID: Matthew Chen, male    DOB: [birth year], 66 y.o.   MRN: [id number]  Patient is currently battling hepatocellular carcinoma.  Initial therapy failed and he is currently on biologic therapy that he is receiving every 3 weeks with a plan to repeat CT scan in 3 months.  Patient reports feeling extremely tired.  He also reports diarrhea.  He states that the fatigue and diarrhea began long before his current cancer therapy began.  There is no evidence of liver failure or jaundice.  On his recent lab work, his TSH was normal, his CBC was normal except for mild anemia at 11, his renal function showed chronic kidney disease that is stable.  His A1c was outstanding at 5.7.  However he is on glipizide which raises concern about hypoglycemic episodes.  He reports that he does not feel sleepy but that he just feels extremely fatigued and weak.  He questions if this could be due to atrial fibrillation.  Today he is in atrial fibrillation on examination.  He denies any shortness of breath or pleurisy or orthopnea.  He also questions if it could be due to diarrhea.  He has ulcerative colitis but he states that the diarrhea began long before he started his current cancer therapy.  He questions if it could be due to the Victoza. Past Medical History:  Diagnosis Date   Adenomatous colon polyp 03/2008   Anginal pain ([place])    Aortic aneurysm, thoracic ([place]) 09/09/2016   4.4 cm by echo 05/2015   Arthritis    "joints in my fingers ache" (07/13/2015)   Bicuspid aortic valve 09/09/2016   Cataract 2019   bilateral eyes   CKD (chronic kidney disease), stage III ([place])    Coronary artery disease    Diabetes mellitus without complication ([place])    Hemorrhoids    Hepatocellular carcinoma ([place])    Hyperlipidemia    Hypertension    Paroxysmal atrial fibrillation ([place])    Peripheral arterial disease ([place])    a. dopppler 05/29/2015 revealed high-grade right popliteal and distal left SFA disease b. LE  angio 06/28/2015 revealing high grade calcific dx with distal L SFA and R popliteal artery s/p diamondback orbital rotational atherectomy, PTA of L SFA  c. 07/13/2015 diamondback orbital rotational atherectomy and drug eluting balloon angioplasty of R popliteal artery (P2 segment) using distal protection    Type II diabetes mellitus ([place])    Ulcerative colitis    Past Surgical History:  Procedure Laterality Date   ABDOMINAL AORTOGRAM W/LOWER EXTREMITY N/A 04/27/2018   Procedure: ABDOMINAL AORTOGRAM W/LOWER EXTREMITY;  Surgeon: Lorretta Harp, MD;  Location: [facility];  Service: Cardiovascular;  Laterality: N/A;   COLONOSCOPY     COLONOSCOPY W/ POLYPECTOMY  X 4-5   CORONARY ATHERECTOMY N/A 02/15/2021   Procedure: CORONARY ATHERECTOMY;  Surgeon: Lorretta Harp, MD;  Location: [facility];  Service: Cardiovascular;  Laterality: N/A;  aborted    CORONARY BALLOON ANGIOPLASTY N/A 02/15/2021   Procedure: CORONARY BALLOON ANGIOPLASTY;  Surgeon: Lorretta Harp, MD;  Location: [facility];  Service: Cardiovascular;  Laterality: N/A;   COSMETIC SURGERY  < 2000   "removed birthmark from top of my head"   IR ANGIOGRAM SELECTIVE EACH ADDITIONAL VESSEL  06/08/2021   IR ANGIOGRAM SELECTIVE EACH ADDITIONAL VESSEL  06/08/2021   IR ANGIOGRAM SELECTIVE EACH ADDITIONAL VESSEL  06/08/2021   IR ANGIOGRAM SELECTIVE EACH ADDITIONAL VESSEL  06/08/2021  IR ANGIOGRAM SELECTIVE EACH ADDITIONAL VESSEL  06/21/2021   IR ANGIOGRAM SELECTIVE EACH ADDITIONAL VESSEL  06/21/2021   IR ANGIOGRAM SELECTIVE EACH ADDITIONAL VESSEL  06/21/2021   IR ANGIOGRAM SELECTIVE EACH ADDITIONAL VESSEL  06/21/2021   IR ANGIOGRAM VISCERAL SELECTIVE  06/08/2021   IR ANGIOGRAM VISCERAL SELECTIVE  06/21/2021   IR EMBO ARTERIAL NOT HEMORR HEMANG INC GUIDE ROADMAPPING  06/08/2021   IR EMBO TUMOR ORGAN ISCHEMIA INFARCT INC GUIDE ROADMAPPING  06/21/2021   IR EMBO TUMOR ORGAN ISCHEMIA INFARCT INC GUIDE ROADMAPPING   06/21/2021   IR RADIOLOGIST EVAL & MGMT  05/08/2021   IR RADIOLOGIST EVAL & MGMT  07/26/2021   IR RADIOLOGIST EVAL & MGMT  09/05/2021   IR RADIOLOGIST EVAL & MGMT  12/27/2021   IR US GUIDE VASC ACCESS LEFT  06/21/2021   IR US GUIDE VASC ACCESS RIGHT  06/08/2021   LEFT HEART CATH AND CORONARY ANGIOGRAPHY N/A 02/08/2021   Procedure: LEFT HEART CATH AND CORONARY ANGIOGRAPHY;  Surgeon: Lorretta Harp, MD;  Location: [facility];  Service: Cardiovascular;  Laterality: N/A;   PERIPHERAL VASCULAR ATHERECTOMY  04/27/2018   Procedure: PERIPHERAL VASCULAR ATHERECTOMY;  Surgeon: Lorretta Harp, MD;  Location: [facility];  Service: Cardiovascular;;  right popliteal artery   PERIPHERAL VASCULAR CATHETERIZATION N/A 06/29/2015   Procedure: Lower Extremity Angiography;  Surgeon: Lorretta Harp, MD;  Location: [facility];  Service: Cardiovascular;  Laterality: N/A;   PERIPHERAL VASCULAR CATHETERIZATION N/A 07/13/2015   Procedure: Lower Extremity Angiography;  Surgeon: Lorretta Harp, MD;  Location: [facility];  Service: Cardiovascular;  Laterality: N/A;   PERIPHERAL VASCULAR CATHETERIZATION  07/13/2015   Procedure: Peripheral Vascular Atherectomy;  Surgeon: Lorretta Harp, MD;  Location: [facility];  Service: Cardiovascular;;   PERIPHERAL VASCULAR INTERVENTION  04/27/2018   Procedure: PERIPHERAL VASCULAR INTERVENTION;  Surgeon: Lorretta Harp, MD;  Location: [facility];  Service: Cardiovascular;;  right popliteal artery   POLYPECTOMY     TONSILLECTOMY     UPPER GASTROINTESTINAL ENDOSCOPY     Current Outpatient Medications on File Prior to Visit  Medication Sig Dispense Refill   acetaminophen (TYLENOL) 500 MG tablet Take 1,000 mg by mouth every 6 (six) hours as needed for moderate pain or headache.     amLODipine (NORVASC) 5 MG tablet Take 1 tablet (5 mg total) by mouth daily. 60 tablet 2   apixaban (ELIQUIS) 5 MG TABS tablet Take 1 tablet (5 mg  total) by mouth 2 (two) times daily. 180 tablet 3   aspirin EC 81 MG tablet Take 1 tablet (81 mg total) by mouth daily. 90 tablet 3   atorvastatin (LIPITOR) 80 MG tablet TAKE 1 TABLET BY MOUTH EVERY DAY 90 tablet 3   B-D UF III MINI PEN NEEDLES 31G X 5 MM MISC USE DAILY WITH VICTOZA 100 each 5   Continuous Blood Gluc Sensor (FREESTYLE LIBRE 2 SENSOR) MISC CHECK BLOOD SUGAR FOUR TIMES DAILY 1 each 1   empagliflozin (JARDIANCE) 25 MG TABS tablet Take 1 tablet (25 mg total) by mouth daily before breakfast. 90 tablet 3   fenofibrate 160 MG tablet TAKE 1 TABLET BY MOUTH EVERY DAY 90 tablet 0   glipiZIDE (GLUCOTROL XL) 5 MG 24 hr tablet TAKE 1 TABLET BY MOUTH EVERY DAY WITH BREAKFAST 90 tablet 3   glucose blood (ONETOUCH ULTRA) test strip CHECK BLOOD SUGAR TWICE A DAY 50 strip 15   liraglutide (VICTOZA) 18 MG/3ML  SOPN INJECT 1.2 MG UNDER THE SKIN ONCE DAILY 6 mL 9   lisinopril-hydrochlorothiazide (ZESTORETIC) 20-25 MG tablet TAKE 1 TABLET BY MOUTH EVERY DAY 30 tablet 0   loperamide (IMODIUM A-D) 2 MG tablet Take 2 mg by mouth 4 (four) times daily as needed for diarrhea or loose stools.     Omega-3 Fatty Acids (FISH OIL) 1000 MG CAPS Take 1,000 mg by mouth 2 (two) times daily.     pantoprazole (PROTONIX) 40 MG tablet Take 1 tablet (40 mg total) by mouth 2 (two) times daily. 60 tablet 11   pioglitazone (ACTOS) 30 MG tablet TAKE 1 TABLET BY MOUTH EVERY DAY STOP TRADJENTA 90 tablet 2   prochlorperazine (COMPAZINE) 10 MG tablet Take 1 tablet (10 mg total) by mouth every 6 (six) hours as needed for nausea. 60 tablet 1   triamcinolone cream (KENALOG) 0.1 % Apply 1 application. topically 2 (two) times daily. For 4 weeks     vedolizumab (ENTYVIO) 300 MG injection Inject 300 mg as directed See admin instructions. Every 2 months     No current facility-administered medications on file prior to visit.   Allergies  Allergen Reactions   Penicillins Other (See Comments)    Unsure, childhood reaction Has patient  had a PCN reaction causing immediate rash, facial/tongue/throat swelling, SOB or lightheadedness with hypotension: Unknown Has patient had a PCN reaction causing severe rash involving mucus membranes or skin necrosis: Unknown Has patient had a PCN reaction that required hospitalization: No Has patient had a PCN reaction occurring within the last 10 years: No If all of the above answers are "NO", then may proceed with Cephalosporin use.    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[employer], academic  Tobacco Use   Smoking status: Former    Packs/day: 1.50    Years: 25.00    Total pack years: 37.50    Types: Cigarettes    Quit date: 08/12/1996    Years since quitting: 25.5   Smokeless tobacco: Never  Vaping Use   Vaping Use: Never used  Substance and Sexual Activity   Alcohol use: Yes    Alcohol/week: 1.0 standard drink of alcohol    Types: 1 Cans of beer per week    Comment: social use   Drug use: No   Sexual activity: Yes  Other Topics Concern   Not on file  Social History Narrative   ** Merged History Encounter **       Social Determinants of Health   Financial Resource Strain: Low Risk  (08/17/2021)   Overall Financial Resource Strain (CARDIA)    Difficulty of Paying Living Expenses: Not very hard  Food Insecurity: Not on file  Transportation Needs: Not on file  Physical Activity: Not on file  Stress: Not on file  Social Connections: Not on file  Intimate Partner Violence: Not on file      Review of Systems  All other systems reviewed and are negative.      Objective:   Physical Exam Vitals reviewed.  Constitutional:      Appearance: He is well-developed.  Neck:     Thyroid: No thyromegaly.     Vascular: No JVD.  Cardiovascular:      Rate  and Rhythm: Normal rate. Rhythm irregular.     Heart sounds: Murmur heard.  Pulmonary:     Effort: Pulmonary effort is normal. No respiratory distress.     Breath sounds: Normal breath sounds. No wheezing or rales.  Abdominal:     General: Bowel sounds are normal. There is no distension.     Palpations: Abdomen is soft.     Tenderness: There is no abdominal tenderness. There is no guarding or rebound.  Skin:    General: Skin is warm.     Findings: No rash.           Assessment & Plan:   Fatigue, unspecified type - Plan: Vitamin B12, Testosterone Total,Free,Bio, Males  Benign essential HTN - Plan: carvedilol (COREG) 12.5 MG tablet  Hepatocellular carcinoma (HCC)  Paroxysmal atrial fibrillation ([place]) Patient is in atrial fibrillation today.  However before we assume that this is the cause of the fatigue I would like to rule out a few other potential causes.  His TSH, CBC, and CMP were recently normal however his A1c was outstanding at 5.7.  This raises concern about possible hypoglycemic episodes from the glipizide.  I like him to stop the glipizide and reassess in 7 to 10 days and see if he feels better.  If not, we could potentially try stopping Victoza to see if stopping the diarrhea would help him feel better.  I will also check a testosterone level along with a B12.  If none of these issues seem to help with fatigue, the neck step will be discussing with cardiology possible cardioversion.  Certainly his fatigue could be multifactorial and due to his chronic kidney disease, coronary artery disease, atrial fibrillation, and hepatocellular carcinoma, on

## 2022-02-19 LAB — VITAMIN B12: Vitamin B-12: 216 pg/mL (ref 200–1100)

## 2022-02-19 LAB — TESTOSTERONE TOTAL,FREE,BIO, MALES
Albumin: 4.1 g/dL (ref 3.6–5.1)
Sex Hormone Binding: 45 nmol/L (ref 22–77)
Testosterone, Bioavailable: 92.2 ng/dL — ABNORMAL LOW (ref 110.0–575.0)
Testosterone, Free: 49 pg/mL (ref 46.0–224.0)
Testosterone: 469 ng/dL (ref 250–827)

## 2022-02-20 ENCOUNTER — Encounter: Payer: Self-pay | Admitting: Physician Assistant

## 2022-02-20 ENCOUNTER — Ambulatory Visit (INDEPENDENT_AMBULATORY_CARE_PROVIDER_SITE_OTHER): Payer: HMO | Admitting: Physician Assistant

## 2022-02-20 VITALS — BP 100/58 | HR 61 | Ht 70.0 in | Wt 230.8 lb

## 2022-02-20 DIAGNOSIS — R0602 Shortness of breath: Secondary | ICD-10-CM

## 2022-02-20 DIAGNOSIS — Z79899 Other long term (current) drug therapy: Secondary | ICD-10-CM | POA: Diagnosis not present

## 2022-02-20 DIAGNOSIS — C220 Liver cell carcinoma: Secondary | ICD-10-CM | POA: Diagnosis not present

## 2022-02-20 DIAGNOSIS — E119 Type 2 diabetes mellitus without complications: Secondary | ICD-10-CM | POA: Diagnosis not present

## 2022-02-20 DIAGNOSIS — I480 Paroxysmal atrial fibrillation: Secondary | ICD-10-CM

## 2022-02-20 DIAGNOSIS — E785 Hyperlipidemia, unspecified: Secondary | ICD-10-CM

## 2022-02-20 DIAGNOSIS — R5383 Other fatigue: Secondary | ICD-10-CM

## 2022-02-20 DIAGNOSIS — I10 Essential (primary) hypertension: Secondary | ICD-10-CM

## 2022-02-20 DIAGNOSIS — I2510 Atherosclerotic heart disease of native coronary artery without angina pectoris: Secondary | ICD-10-CM

## 2022-02-20 MED ORDER — LISINOPRIL 20 MG PO TABS: 20.0000 mg | ORAL_TABLET | Freq: Every day | ORAL | 3 refills | Status: DC

## 2022-02-20 NOTE — Progress Notes (Signed)
Cardiology Office Note:    Date:  02/22/2022   ID:  Matthew Chen, DOB October 07, 1955, MRN [id number]  PCP:  Susy Frizzle, MD   [place] Prov[facility] Cardiologist:  Quay Burow, MD     Referring MD: Susy Frizzle, MD   Chief Complaint  Patient presents with   Follow-up    Seen for [name]    History of Present Illness:    Matthew Chen is a 66 y.o. male with a hx of thoracic aortic aneurysm, CKD stage III, CAD, hypertension, hyperlipidemia, DM2, PAD and paroxysmal atrial fibrillation.  He smoked for 25 years and quit more than 17 years ago.  He has never had a heart attack or stroke.  He underwent orbital rotational arthrectomy and drug eluting balloon angioplasty of distal left SFA in November 2016.  Patient underwent another orbital rotational arthrectomy and balloon angioplasty for high-grade subocclusive right popliteal plaque in December 2016.  Doppler in August 2019 showed a normal ABI bilaterally with high-frequency signal in the right popliteal artery.  He underwent another angioplasty in September 2019 revealing 95% right popliteal stenosis with three-vessel runoff, this was treated with rotational atherectomy and self-expanding stent.  Most recent Doppler in April 2020 revealed a normal ABI bilaterally with patent SFA stent.  Due to complaint of exertional chest pain and dyspnea, a 2D echocardiogram was performed last year that showed EF of 60 to 65%, mild MR, mild aortic stenosis, 46 mm ascending thoracic aortic aneurysm.  Myoview was nonischemic in June 2022.  Chest CT did show a 7.1 cm mass in the liver.  MRI of the liver showed hyperenhancing subcapsular mass of the anterior inferior right lobe of the liver, most consistent with hepatocellular carcinoma.  Patient has been referred to GI and oncology.  Subsequent liver biopsy confirmed hepatocellular carcinoma.  Cardiac catheterization in June 2022 revealed a high-grade subtotally occluded ostial calcified D1.   Patient returned a week later on 02/15/2021 with plan to intervene on the significant diagonal lesion, however procedure was complicated by coronary perforation.  Fortunately he did not develop pericardial effusion.  During the hospitalization, he had paroxysmal atrial fibrillation which was also recorded on event monitor subsequent to that admission.  He has been placed on Eliquis.  2D echo performed in August 2022 revealed normal EF, grade 2 DD, mild aortic stenosis and a mildly dilated ascending aorta.   I last saw the patient in April 2023, EKGs demonstrated he has went back into atrial fibrillation however rate controlled. By the time he presented for cardioversion on 5/18, he was back in normal sinus rhythm, therefore the procedure was canceled.  ABI obtained in May 2023 showed right ABI 0.84, left ABI 0.99 with occluded distal right SFA and popliteal artery.  He was minimally symptomatic.  He is being seen by the cancer center for hepatocellular carcinoma.  More recently, patient was evaluated by Dr. Dennard Schaumann 02/18/2022 for fatigue.  Blood work obtained on 6/30 showed creatinine 1.93 which is at his baseline.  Well-controlled cholesterol.  Hemoglobin 11.4.  Hemoglobin A1c 5.7.  Normal TSH.  Testosterone normal.  B12 low.  Patient presents today for follow-up.  His amlodipine has recently been discontinued due to low blood pressure.  His blood pressure remain low today at 100/58, I recommended discontinuing lisinopril-HCTZ and replace it with lisinopril 20 mg daily by itself.  Given dyspnea, I recommended repeat echocardiogram.  He will need a basic metabolic panel in 2 weeks after discontinuing HCTZ to  make sure his potassium is stable.  He will let us know if he has increased leg swelling.  He can follow-up in 6 weeks.  On physical exam, his lung is clear, he has normal extremity edema, he appears to be euvolemic.  Recent blood work showed stable renal function and red blood cell count.  Patient has been  receiving chemotherapy, some of this fatigue could be related to chemo.  Past Medical History:  Diagnosis Date   Adenomatous colon polyp 03/2008   Anginal pain ([place])    Aortic aneurysm, thoracic ([place]) 09/09/2016   4.4 cm by echo 05/2015   Arthritis    "joints in my fingers ache" (07/13/2015)   Bicuspid aortic valve 09/09/2016   Cataract 2019   bilateral eyes   CKD (chronic kidney disease), stage III ([place])    Coronary artery disease    Diabetes mellitus without complication ([place])    Hemorrhoids    Hepatocellular carcinoma ([place])    Hyperlipidemia    Hypertension    Paroxysmal atrial fibrillation ([place])    Peripheral arterial disease ([place])    a. dopppler 05/29/2015 revealed high-grade right popliteal and distal left SFA disease b. LE angio 06/28/2015 revealing high grade calcific dx with distal L SFA and R popliteal artery s/p diamondback orbital rotational atherectomy, PTA of L SFA  c. 07/13/2015 diamondback orbital rotational atherectomy and drug eluting balloon angioplasty of R popliteal artery (P2 segment) using distal protection    Type II diabetes mellitus ([place])    Ulcerative colitis     Past Surgical History:  Procedure Laterality Date   ABDOMINAL AORTOGRAM W/LOWER EXTREMITY N/A 04/27/2018   Procedure: ABDOMINAL AORTOGRAM W/LOWER EXTREMITY;  Surgeon: Lorretta Harp, MD;  Location: [facility];  Service: Cardiovascular;  Laterality: N/A;   COLONOSCOPY     COLONOSCOPY W/ POLYPECTOMY  X 4-5   CORONARY ATHERECTOMY N/A 02/15/2021   Procedure: CORONARY ATHERECTOMY;  Surgeon: Lorretta Harp, MD;  Location: [facility];  Service: Cardiovascular;  Laterality: N/A;  aborted    CORONARY BALLOON ANGIOPLASTY N/A 02/15/2021   Procedure: CORONARY BALLOON ANGIOPLASTY;  Surgeon: Lorretta Harp, MD;  Location: [facility];  Service: Cardiovascular;  Laterality: N/A;   COSMETIC SURGERY  < 2000   "removed birthmark from top of my head"   IR ANGIOGRAM SELECTIVE EACH  ADDITIONAL VESSEL  06/08/2021   IR ANGIOGRAM SELECTIVE EACH ADDITIONAL VESSEL  06/08/2021   IR ANGIOGRAM SELECTIVE EACH ADDITIONAL VESSEL  06/08/2021   IR ANGIOGRAM SELECTIVE EACH ADDITIONAL VESSEL  06/08/2021   IR ANGIOGRAM SELECTIVE EACH ADDITIONAL VESSEL  06/21/2021   IR ANGIOGRAM SELECTIVE EACH ADDITIONAL VESSEL  06/21/2021   IR ANGIOGRAM SELECTIVE EACH ADDITIONAL VESSEL  06/21/2021   IR ANGIOGRAM SELECTIVE EACH ADDITIONAL VESSEL  06/21/2021   IR ANGIOGRAM VISCERAL SELECTIVE  06/08/2021   IR ANGIOGRAM VISCERAL SELECTIVE  06/21/2021   IR EMBO ARTERIAL NOT HEMORR HEMANG INC GUIDE ROADMAPPING  06/08/2021   IR EMBO TUMOR ORGAN ISCHEMIA INFARCT INC GUIDE ROADMAPPING  06/21/2021   IR EMBO TUMOR ORGAN ISCHEMIA INFARCT INC GUIDE ROADMAPPING  06/21/2021   IR RADIOLOGIST EVAL & MGMT  05/08/2021   IR RADIOLOGIST EVAL & MGMT  07/26/2021   IR RADIOLOGIST EVAL & MGMT  09/05/2021   IR RADIOLOGIST EVAL & MGMT  12/27/2021   IR US GUIDE VASC ACCESS LEFT  06/21/2021   IR US GUIDE VASC ACCESS RIGHT  06/08/2021   LEFT HEART CATH AND CORONARY ANGIOGRAPHY N/A 02/08/2021  Procedure: LEFT HEART CATH AND CORONARY ANGIOGRAPHY;  Surgeon: Lorretta Harp, MD;  Location: [facility];  Service: Cardiovascular;  Laterality: N/A;   PERIPHERAL VASCULAR ATHERECTOMY  04/27/2018   Procedure: PERIPHERAL VASCULAR ATHERECTOMY;  Surgeon: Lorretta Harp, MD;  Location: [facility];  Service: Cardiovascular;;  right popliteal artery   PERIPHERAL VASCULAR CATHETERIZATION N/A 06/29/2015   Procedure: Lower Extremity Angiography;  Surgeon: Lorretta Harp, MD;  Location: [facility];  Service: Cardiovascular;  Laterality: N/A;   PERIPHERAL VASCULAR CATHETERIZATION N/A 07/13/2015   Procedure: Lower Extremity Angiography;  Surgeon: Lorretta Harp, MD;  Location: [facility];  Service: Cardiovascular;  Laterality: N/A;   PERIPHERAL VASCULAR CATHETERIZATION  07/13/2015   Procedure: Peripheral  Vascular Atherectomy;  Surgeon: Lorretta Harp, MD;  Location: [facility];  Service: Cardiovascular;;   PERIPHERAL VASCULAR INTERVENTION  04/27/2018   Procedure: PERIPHERAL VASCULAR INTERVENTION;  Surgeon: Lorretta Harp, MD;  Location: [facility];  Service: Cardiovascular;;  right popliteal artery   POLYPECTOMY     TONSILLECTOMY     UPPER GASTROINTESTINAL ENDOSCOPY      Current Medications: Current Meds  Medication Sig   acetaminophen (TYLENOL) 500 MG tablet Take 1,000 mg by mouth every 6 (six) hours as needed for moderate pain or headache.   apixaban (ELIQUIS) 5 MG TABS tablet Take 1 tablet (5 mg total) by mouth 2 (two) times daily.   aspirin EC 81 MG tablet Take 1 tablet (81 mg total) by mouth daily.   atorvastatin (LIPITOR) 80 MG tablet TAKE 1 TABLET BY MOUTH EVERY DAY   B-D UF III MINI PEN NEEDLES 31G X 5 MM MISC USE DAILY WITH VICTOZA   carvedilol (COREG) 12.5 MG tablet Take 3 tablets (37.5 mg total) by mouth 2 (two) times daily.   Continuous Blood Gluc Sensor (FREESTYLE LIBRE 2 SENSOR) MISC CHECK BLOOD SUGAR FOUR TIMES DAILY   empagliflozin (JARDIANCE) 25 MG TABS tablet Take 1 tablet (25 mg total) by mouth daily before breakfast.   fenofibrate 160 MG tablet TAKE 1 TABLET BY MOUTH EVERY DAY   glucose blood (ONETOUCH ULTRA) test strip CHECK BLOOD SUGAR TWICE A DAY   liraglutide (VICTOZA) 18 MG/3ML SOPN INJECT 1.2 MG UNDER THE SKIN ONCE DAILY   lisinopril (ZESTRIL) 20 MG tablet Take 1 tablet (20 mg total) by mouth daily.   loperamide (IMODIUM A-D) 2 MG tablet Take 2 mg by mouth 4 (four) times daily as needed for diarrhea or loose stools.   Omega-3 Fatty Acids (FISH OIL) 1000 MG CAPS Take 1,000 mg by mouth 2 (two) times daily.   pantoprazole (PROTONIX) 40 MG tablet Take 1 tablet (40 mg total) by mouth 2 (two) times daily.   pioglitazone (ACTOS) 30 MG tablet TAKE 1 TABLET BY MOUTH EVERY DAY STOP TRADJENTA   prochlorperazine (COMPAZINE) 10 MG tablet Take 1 tablet (10  mg total) by mouth every 6 (six) hours as needed for nausea.   triamcinolone cream (KENALOG) 0.1 % Apply 1 application. topically 2 (two) times daily. For 4 weeks   vedolizumab (ENTYVIO) 300 MG injection Inject 300 mg as directed See admin instructions. Every 2 months   [DISCONTINUED] lisinopril-hydrochlorothiazide (ZESTORETIC) 20-25 MG tablet TAKE 1 TABLET BY MOUTH EVERY DAY     Allergies:   Penicillins   Social History   Socioeconomic History   Marital status: Married    Spouse name: Latif Nazareno   Number of children: 2   Years  of education: Not on file   Highest education level: Not on file  Occupational History   Occupation: Truck Education administrator: [employer]  Tobacco Use   Smoking status: Former    Packs/day: 1.50    Years: 25.00    Total pack years: 37.50    Types: Cigarettes    Quit date: 08/12/1996    Years since quitting: 25.5   Smokeless tobacco: Never  Vaping Use   Vaping Use: Never used  Substance and Sexual Activity   Alcohol use: Yes    Alcohol/week: 1.0 standard drink of alcohol    Types: 1 Cans of beer per week    Comment: social use   Drug use: No   Sexual activity: Yes  Other Topics Concern   Not on file  Social History Narrative   ** Merged History Encounter **       Social Determinants of Health   Financial Resource Strain: Low Risk  (08/17/2021)   Overall Financial Resource Strain (CARDIA)    Difficulty of Paying Living Expenses: Not very hard  Food Insecurity: Not on file  Transportation Needs: Not on file  Physical Activity: Not on file  Stress: Not on file  Social Connections: Not on file     Family History: The patient's family history includes Colon cancer in his maternal grandmother; Colon polyps in his mother; Diabetes in his maternal aunt and mother; Heart disease in his father. There is no history of Esophageal cancer, Stomach cancer, or Rectal cancer.  ROS:   Please see the history of present illness.     All other  systems reviewed and are negative.  EKGs/Labs/Other Studies Reviewed:    The following studies were reviewed today:  Echo 03/13/2021 1. Left ventricular ejection fraction, by estimation, is 60 to 65%. The  left ventricle has normal function. The left ventricle has no regional  wall motion abnormalities. There is mild left ventricular hypertrophy.  Left ventricular diastolic parameters  are consistent with Grade II diastolic dysfunction (pseudonormalization).   2. Right ventricular systolic function is normal. The right ventricular  size is normal.   3. Left atrial size was moderately dilated.   4. Right atrial size was moderately dilated.   5. The mitral valve is normal in structure. Trivial mitral valve  regurgitation. No evidence of mitral stenosis.   6. The aortic valve is tricuspid. There is moderate calcification of the  aortic valve. Aortic valve regurgitation is trivial. Mild aortic valve  stenosis.   7. Aortic dilatation noted. There is mild dilatation of the ascending  aorta, measuring 40 mm.   8. The inferior vena cava is normal in size with greater than 50%  respiratory variability, suggesting right atrial pressure of 3 mmHg.   EKG:  EKG is ordered today.  The ekg ordered today demonstrates normal sinus rhythm, no significant ST-T wave changes.  Single PAC.  Recent Labs: 02/04/2022: TSH 1.085 02/08/2022: ALT 18; BUN 28; Creat 1.93; Hemoglobin 11.4; Platelets 248; Potassium 4.5; Sodium 139  Recent Lipid Panel    Component Value Date/Time   CHOL 95 02/08/2022 0831   CHOL 119 10/07/2016 0843   TRIG 125 02/08/2022 0831   HDL 37 (L) 02/08/2022 0831   HDL 26 (L) 10/07/2016 0843   CHOLHDL 2.6 02/08/2022 0831   VLDL 47 (H) 11/25/2016 0838   LDLCALC 37 02/08/2022 0831     Risk Assessment/Calculations:    CHA2DS2-VASc Score = 4   This indicates a 4.8%  annual risk of stroke. The patient's score is based upon: CHF History: 0 HTN History: 1 Diabetes History: 1 Stroke  History: 0 Vascular Disease History: 1 Age Score: 1 Gender Score: 0          Physical Exam:    VS:  BP (!) 100/58   Pulse 61   Ht 5' 10"  (1.778 m)   Wt 230 lb 12.8 oz (104.7 kg)   SpO2 100%   BMI 33.12 kg/m     Wt Readings from Last 3 Encounters:  02/20/22 230 lb 12.8 oz (104.7 kg)  02/18/22 232 lb (105.2 kg)  02/07/22 234 lb 9.6 oz (106.4 kg)     GEN:  Well nourished, well developed in no acute distress HEENT: Normal NECK: No JVD; No carotid bruits LYMPHATICS: No lymphadenopathy CARDIAC: RRR, no murmurs, rubs, gallops RESPIRATORY:  Clear to auscultation without rales, wheezing or rhonchi  ABDOMEN: Soft, non-tender, non-distended MUSCULOSKELETAL:  No edema; No deformity  SKIN: Warm and dry NEUROLOGIC:  Alert and oriented x 3 PSYCHIATRIC:  Normal affect   ASSESSMENT:    1. Shortness of breath   2. Fatigue, unspecified type   3. Medication management   4. Coronary artery disease involving native coronary artery of native heart without angina pectoris   5. PAF (paroxysmal atrial fibrillation) ([place])   6. Primary hypertension   7. Hyperlipidemia LDL goal <70   8. Controlled type 2 diabetes mellitus without complication, without long-term current use of insulin ([place])   9. Hepatocellular carcinoma ([place])    PLAN:    In order of problems listed above:  Shortness of breath: Obtain echocardiogram  Fatigue: Blood pressure borderline low, will discontinue lisinopril-HCTZ and continue on lisinopril 20 mg daily by itself.  Hopefully by increasing the blood pressure, his fatigue is going to improve.  Of course, fatigue could be related to chemotherapy for hepatocellular carcinoma.  CAD: Denies any recent chest pain  PAF: Recently presented for outpatient cardioversion, however by the time he arrived, he was back in normal sinus rhythm.  He is maintaining sinus rhythm on today's EKG.  Hypertension: Blood pressure stable  Hyperlipidemia: On Lipitor  DM2: Managed by  primary care provider  Hepatocellular carcinoma: Receiving chemotherapy.           Medication Adjustments/Labs and Tests Ordered: Current medicines are reviewed at length with the patient today.  Concerns regarding medicines are outlined above.  Orders Placed This Encounter  Procedures   Basic Metabolic Panel (BMET)   EKG 12-Lead   Meds ordered this encounter  Medications   lisinopril (ZESTRIL) 20 MG tablet    Sig: Take 1 tablet (20 mg total) by mouth daily.    Dispense:  90 tablet    Refill:  3    Patient Instructions  Medication Instructions:  Your physician has recommended you make the following change in your medication:  STOP: Lisinopril Hydrochlorothiazide START: Lisinopril 22m daily   *If you need a refill on your cardiac medications before your next appointment, please call your pharmacy*   Lab Work: Your physician recommends that you return for lab work in 2 Weeks : BMET  If you have labs (blood work) drawn today and your tests are completely normal, you will receive your results only by: [facility](if you have MyChart) OR A paper copy in the mail If you have any lab test that is abnormal or we need to change your treatment, we will call you to review the results.   Testing/Procedures: NONE  Follow-Up: At [facility], you and your health needs are our priority.  As part of our continuing mission to provide you with exceptional heart care, we have created designated Provider Care Teams.  These Care Teams include your primary Cardiologist (physician) and Advanced Practice Providers (APPs -  Physician Assistants and Nurse Practitioners) who all work together to provide you with the care you need, when you need it.  Your next appointment:   6 Weeks  The format for your next appointment:   In Person  Provider:   Almyra Deforest, [name]         Signed, Almyra Deforest, [place]  02/22/2022 11:33 PM    [place]

## 2022-02-20 NOTE — Patient Instructions (Addendum)
Medication Instructions:  Your physician has recommended you make the following change in your medication:  STOP: Lisinopril Hydrochlorothiazide START: Lisinopril 67m daily   *If you need a refill on your cardiac medications before your next appointment, please call your pharmacy*   Lab Work: Your physician recommends that you return for lab work in 2 Weeks : BMET  If you have labs (blood work) drawn today and your tests are completely normal, you will receive your results only by: [facility](if you have MyChart) OR A paper copy in the mail If you have any lab test that is abnormal or we need to change your treatment, we will call you to review the results.   Testing/Procedures: NONE  Follow-Up: [facility] and your [facility] needs are our priority.  As part of our continuing mission to provide you with exceptional heart care, we have created designated Provider Care Teams.  These Care Teams include your primary Cardiologist (physician) and Advanced Practice Providers (APPs -  Physician Assistants and Nurse Practitioners) who all work together to provide you with the care you need, when you need it.  Your next appointment:   6 Weeks  The format for your next appointment:   In Person  Provider:   HAlmyra Deforest [name]

## 2022-02-21 ENCOUNTER — Telehealth: Payer: HMO

## 2022-02-22 ENCOUNTER — Encounter: Payer: Self-pay | Admitting: Physician Assistant

## 2022-02-24 ENCOUNTER — Other Ambulatory Visit: Payer: Self-pay | Admitting: Oncology

## 2022-02-27 ENCOUNTER — Inpatient Hospital Stay: Payer: HMO | Attending: Oncology | Admitting: Oncology

## 2022-02-27 ENCOUNTER — Other Ambulatory Visit: Payer: Self-pay

## 2022-02-27 ENCOUNTER — Inpatient Hospital Stay: Payer: HMO

## 2022-02-27 ENCOUNTER — Encounter: Payer: Self-pay | Admitting: *Deleted

## 2022-02-27 VITALS — BP 97/70 | HR 95 | Temp 98.1°F | Resp 16 | Wt 233.2 lb

## 2022-02-27 VITALS — BP 108/74 | HR 82 | Resp 17

## 2022-02-27 DIAGNOSIS — Z5112 Encounter for antineoplastic immunotherapy: Secondary | ICD-10-CM | POA: Diagnosis not present

## 2022-02-27 DIAGNOSIS — C220 Liver cell carcinoma: Secondary | ICD-10-CM | POA: Diagnosis not present

## 2022-02-27 DIAGNOSIS — Z79899 Other long term (current) drug therapy: Secondary | ICD-10-CM | POA: Insufficient documentation

## 2022-02-27 LAB — BASIC METABOLIC PANEL
BUN/Creatinine Ratio: 16 (ref 10–24)
BUN: 29 mg/dL — ABNORMAL HIGH (ref 8–27)
CO2: 22 mmol/L (ref 20–29)
Calcium: 8.8 mg/dL (ref 8.6–10.2)
Chloride: 105 mmol/L (ref 96–106)
Creatinine, Ser: 1.87 mg/dL — ABNORMAL HIGH (ref 0.76–1.27)
Glucose: 195 mg/dL — ABNORMAL HIGH (ref 70–99)
Potassium: 5 mmol/L (ref 3.5–5.2)
Sodium: 140 mmol/L (ref 134–144)
eGFR: 39 mL/min/{1.73_m2} — ABNORMAL LOW (ref >59)

## 2022-02-27 LAB — CMP (CANCER CENTER ONLY)
ALT: 15 U/L (ref 0–44)
AST: 19 U/L (ref 15–41)
Albumin: 3.7 g/dL (ref 3.5–5.0)
Alkaline Phosphatase: 63 U/L (ref 38–126)
Anion gap: 10 (ref 5–15)
BUN: 29 mg/dL — ABNORMAL HIGH (ref 8–23)
CO2: 22 mmol/L (ref 22–32)
Calcium: 9.4 mg/dL (ref 8.9–10.3)
Chloride: 107 mmol/L (ref 98–111)
Creatinine: 1.99 mg/dL — ABNORMAL HIGH (ref 0.61–1.24)
GFR, Estimated: 36 mL/min — ABNORMAL LOW (ref >60)
Glucose, Bld: 194 mg/dL — ABNORMAL HIGH (ref 70–99)
Potassium: 4.7 mmol/L (ref 3.5–5.1)
Sodium: 139 mmol/L (ref 135–145)
Total Bilirubin: 0.5 mg/dL (ref 0.3–1.2)
Total Protein: 7.1 g/dL (ref 6.5–8.1)

## 2022-02-27 LAB — CBC WITH DIFFERENTIAL (CANCER CENTER ONLY)
Abs Immature Granulocytes: 0.02 10*3/uL (ref 0.00–0.07)
Basophils Absolute: 0.1 10*3/uL (ref 0.0–0.1)
Basophils Relative: 1 %
Eosinophils Absolute: 0.2 10*3/uL (ref 0.0–0.5)
Eosinophils Relative: 5 %
HCT: 36 % — ABNORMAL LOW (ref 39.0–52.0)
Hemoglobin: 11.4 g/dL — ABNORMAL LOW (ref 13.0–17.0)
Immature Granulocytes: 0 %
Lymphocytes Relative: 12 %
Lymphs Abs: 0.6 10*3/uL — ABNORMAL LOW (ref 0.7–4.0)
MCH: 27 pg (ref 26.0–34.0)
MCHC: 31.7 g/dL (ref 30.0–36.0)
MCV: 85.3 fL (ref 80.0–100.0)
Monocytes Absolute: 0.7 10*3/uL (ref 0.1–1.0)
Monocytes Relative: 14 %
Neutro Abs: 3 10*3/uL (ref 1.7–7.7)
Neutrophils Relative %: 68 %
Platelet Count: 284 10*3/uL (ref 150–400)
RBC: 4.22 MIL/uL (ref 4.22–5.81)
RDW: 17.6 % — ABNORMAL HIGH (ref 11.5–15.5)
WBC Count: 4.5 10*3/uL (ref 4.0–10.5)
nRBC: 0 % (ref 0.0–0.2)

## 2022-02-27 MED ORDER — SODIUM CHLORIDE 0.9 % IV SOLN
1200.0000 mg | Freq: Once | INTRAVENOUS | Status: AC
  Administered 2022-02-27: 1200 mg via INTRAVENOUS
  Filled 2022-02-27: qty 20

## 2022-02-27 MED ORDER — SODIUM CHLORIDE 0.9 % IV SOLN: Freq: Once | INTRAVENOUS | Status: AC

## 2022-02-27 MED ORDER — SODIUM CHLORIDE 0.9 % IV SOLN
15.0000 mg/kg | Freq: Once | INTRAVENOUS | Status: AC
  Administered 2022-02-27: 1600 mg via INTRAVENOUS
  Filled 2022-02-27: qty 64

## 2022-02-27 NOTE — Progress Notes (Signed)
Patient seen by Dr. Benay Spice today  Vitals are within treatment parameters.  Labs reviewed by Dr. Benay Spice and are not all within treatment parameters. Creatinine 1.99 -OK to treat per MD  Per physician team, patient is ready for treatment and there are NO modifications to the treatment plan.

## 2022-02-27 NOTE — Patient Instructions (Signed)
[facility]   Discharge Instructions: Thank you for choosing [facility] to provide your oncology and hematology care.   If you have a lab appointment with the [facility], please go directly to the [facility] and check in at the registration area.   Wear comfortable clothing and clothing appropriate for easy access to any Portacath or PICC line.   We strive to give you quality time with your provider. You may need to reschedule your appointment if you arrive late (15 or more minutes).  Arriving late affects you and other patients whose appointments are after yours.  Also, if you miss three or more appointments without notifying the office, you may be dismissed from the clinic at the provider's discretion.      For prescription refill requests, have your pharmacy contact our office and allow 72 hours for refills to be completed.    Today you received the following chemotherapy and/or immunotherapy agents Tecentriq, Bevacizumab-bvzr.      To help prevent nausea and vomiting after your treatment, we encourage you to take your nausea medication as directed.  BELOW ARE SYMPTOMS THAT SHOULD BE REPORTED IMMEDIATELY: *FEVER GREATER THAN 100.4 F (38 C) OR HIGHER *CHILLS OR SWEATING *NAUSEA AND VOMITING THAT IS NOT CONTROLLED WITH YOUR NAUSEA MEDICATION *UNUSUAL SHORTNESS OF BREATH *UNUSUAL BRUISING OR BLEEDING *URINARY PROBLEMS (pain or burning when urinating, or frequent urination) *BOWEL PROBLEMS (unusual diarrhea, constipation, pain near the anus) TENDERNESS IN MOUTH AND THROAT WITH OR WITHOUT PRESENCE OF ULCERS (sore throat, sores in mouth, or a toothache) UNUSUAL RASH, SWELLING OR PAIN  UNUSUAL VAGINAL DISCHARGE OR ITCHING   Items with * indicate a potential emergency and should be followed up as soon as possible or go to the Emergency Department if any problems should occur.  Please show the CHEMOTHERAPY ALERT CARD or IMMUNOTHERAPY ALERT  CARD at check-in to the Emergency Department and triage nurse.  Should you have questions after your visit or need to cancel or reschedule your appointment, please contact [facility]  Dept: [phone]  and follow the prompts.  Office hours are 8:00 a.m. to 4:30 p.m. Monday - Friday. Please note that voicemails left after 4:00 p.m. may not be returned until the following business day.  We are closed weekends and major holidays. You have access to a nurse at all times for urgent questions. Please call the main number to the clinic Dept: [phone] and follow the prompts.   For any non-urgent questions, you may also contact your provider using MyChart. We now offer e-Visits for anyone 68 and older to request care online for non-urgent symptoms. For details visit [email or web address].   Also download the MyChart app! Go to the app store, search "MyChart", open the app, select [facility], and log in with your MyChart username and password.  Masks are optional in the cancer centers. If you would like for your care team to wear a mask while they are taking care of you, please let them know. For doctor visits, patients may have with them one support person who is at least 66 years old. At this time, visitors are not allowed in the infusion area.  Atezolizumab injection What is this medication? ATEZOLIZUMAB (a te zoe LIZ ue mab) is a monoclonal antibody. It is used to treat bladder cancer (urothelial cancer), liver cancer, lung cancer, and melanoma. This medicine may be used for other purposes; ask your health care provider or pharmacist if  you have questions. COMMON BRAND NAME(S): Tecentriq What should I tell my care team before I take this medication? They need to know if you have any of these conditions: autoimmune diseases like Crohn's disease, ulcerative colitis, or lupus have had or planning to have an allogeneic stem cell transplant (uses someone else's stem  cells) history of organ transplant history of radiation to the chest nervous system problems like myasthenia gravis or Guillain-Barre syndrome an unusual or allergic reaction to atezolizumab, other medicines, foods, dyes, or preservatives pregnant or trying to get pregnant breast-feeding How should I use this medication? This medicine is for infusion into a vein. It is given by a health care professional in a hospital or clinic setting. A special MedGuide will be given to you before each treatment. Be sure to read this information carefully each time. Talk to your pediatrician regarding the use of this medicine in children. Special care may be needed. Overdosage: If you think you have taken too much of this medicine contact a poison control center or emergency room at once. NOTE: This medicine is only for you. Do not share this medicine with others. What if I miss a dose? It is important not to miss your dose. Call your doctor or health care professional if you are unable to keep an appointment. What may interact with this medication? Interactions have not been studied. This list may not describe all possible interactions. Give your health care provider a list of all the medicines, herbs, non-prescription drugs, or dietary supplements you use. Also tell them if you smoke, drink alcohol, or use illegal drugs. Some items may interact with your medicine. What should I watch for while using this medication? Your condition will be monitored carefully while you are receiving this medicine. You may need blood work done while you are taking this medicine. Do not become pregnant while taking this medicine or for at least 5 months after stopping it. Women should inform their doctor if they wish to become pregnant or think they might be pregnant. There is a potential for serious side effects to an unborn child. Talk to your health care professional or pharmacist for more information. Do not breast-feed an  infant while taking this medicine or for at least 5 months after the last dose. What side effects may I notice from receiving this medication? Side effects that you should report to your doctor or health care professional as soon as possible: allergic reactions like skin rash, itching or hives, swelling of the face, lips, or tongue black, tarry stools bloody or watery diarrhea breathing problems changes in vision chest pain or chest tightness chills facial flushing fever headache signs and symptoms of high blood sugar such as dizziness; dry mouth; dry skin; fruity breath; nausea; stomach pain; increased hunger or thirst; increased urination signs and symptoms of liver injury like dark yellow or brown urine; general ill feeling or flu-like symptoms; light-colored stools; loss of appetite; nausea; right upper belly pain; unusually weak or tired; yellowing of the eyes or skin stomach pain trouble passing urine or change in the amount of urine Side effects that usually do not require medical attention (report to your doctor or health care professional if they continue or are bothersome): bone pain cough diarrhea joint pain muscle pain muscle weakness swelling of arms or legs tiredness weight loss This list may not describe all possible side effects. Call your doctor for medical advice about side effects. You may report side effects to FDA at 1-800-FDA-1088. Where  should I keep my medication? This drug is given in a hospital or clinic and will not be stored at home. NOTE: This sheet is a summary. It may not cover all possible information. If you have questions about this medicine, talk to your doctor, pharmacist, or health care provider.  2023 Elsevier/Gold Standard (2021-06-29 00:00:00) Bevacizumab injection What is this medication? BEVACIZUMAB (be va SIZ yoo mab) is a monoclonal antibody. It is used to treat many types of cancer. This medicine may be used for other purposes; ask your  health care provider or pharmacist if you have questions. COMMON BRAND NAME(S): Alymsys, Avastin, MVASI, Noah Charon What should I tell my care team before I take this medication? They need to know if you have any of these conditions: diabetes heart disease high blood pressure history of coughing up blood prior anthracycline chemotherapy (e.g., doxorubicin, daunorubicin, epirubicin) recent or ongoing radiation therapy recent or planning to have surgery stroke an unusual or allergic reaction to bevacizumab, hamster proteins, mouse proteins, other medicines, foods, dyes, or preservatives pregnant or trying to get pregnant breast-feeding How should I use this medication? This medicine is for infusion into a vein. It is given by a health care professional in a hospital or clinic setting. Talk to your pediatrician regarding the use of this medicine in children. Special care may be needed. Overdosage: If you think you have taken too much of this medicine contact a poison control center or emergency room at once. NOTE: This medicine is only for you. Do not share this medicine with others. What if I miss a dose? It is important not to miss your dose. Call your doctor or health care professional if you are unable to keep an appointment. What may interact with this medication? Interactions are not expected. This list may not describe all possible interactions. Give your health care provider a list of all the medicines, herbs, non-prescription drugs, or dietary supplements you use. Also tell them if you smoke, drink alcohol, or use illegal drugs. Some items may interact with your medicine. What should I watch for while using this medication? Your condition will be monitored carefully while you are receiving this medicine. You will need important blood work and urine testing done while you are taking this medicine. This medicine may increase your risk to bruise or bleed. Call your doctor or health care  professional if you notice any unusual bleeding. Before having surgery, talk to your health care provider to make sure it is ok. This drug can increase the risk of poor healing of your surgical site or wound. You will need to stop this drug for 28 days before surgery. After surgery, wait at least 28 days before restarting this drug. Make sure the surgical site or wound is healed enough before restarting this drug. Talk to your health care provider if questions. Do not become pregnant while taking this medicine or for 6 months after stopping it. Women should inform their doctor if they wish to become pregnant or think they might be pregnant. There is a potential for serious side effects to an unborn child. Talk to your health care professional or pharmacist for more information. Do not breast-feed an infant while taking this medicine and for 6 months after the last dose. This medicine has caused ovarian failure in some women. This medicine may interfere with the ability to have a child. You should talk to your doctor or health care professional if you are concerned about your fertility. What side effects may  I notice from receiving this medication? Side effects that you should report to your doctor or health care professional as soon as possible: allergic reactions like skin rash, itching or hives, swelling of the face, lips, or tongue chest pain or chest tightness chills coughing up blood high fever seizures severe constipation signs and symptoms of bleeding such as bloody or black, tarry stools; red or dark-brown urine; spitting up blood or brown material that looks like coffee grounds; red spots on the skin; unusual bruising or bleeding from the eye, gums, or nose signs and symptoms of a blood clot such as breathing problems; chest pain; severe, sudden headache; pain, swelling, warmth in the leg signs and symptoms of a stroke like changes in vision; confusion; trouble speaking or understanding;  severe headaches; sudden numbness or weakness of the face, arm or leg; trouble walking; dizziness; loss of balance or coordination stomach pain sweating swelling of legs or ankles vomiting weight gain Side effects that usually do not require medical attention (report to your doctor or health care professional if they continue or are bothersome): back pain changes in taste decreased appetite dry skin nausea tiredness This list may not describe all possible side effects. Call your doctor for medical advice about side effects. You may report side effects to FDA at 1-800-FDA-1088. Where should I keep my medication? This drug is given in a hospital or clinic and will not be stored at home. NOTE: This sheet is a summary. It may not cover all possible information. If you have questions about this medicine, talk to your doctor, pharmacist, or health care provider.  2023 Elsevier/Gold Standard (2021-06-29 00:00:00)

## 2022-02-27 NOTE — Progress Notes (Signed)
  [facility] OFFICE PROGRESS NOTE   Diagnosis: Hepatocellular carcinoma  INTERVAL HISTORY:   Matthew Chen completed cycle 1 atezolizumab/bevacizumab on 02/07/2022.  No rash or diarrhea.  No new complaint.  Objective:  Vital signs in last 24 hours:  Blood pressure 97/70, pulse 95, temperature 98.1 F (36.7 C), temperature source Temporal, resp. rate 16, weight 233 lb 3.2 oz (105.8 kg), SpO2 99 %.    HEENT: No thrush or ulcers Resp: Lungs clear bilaterally Cardio: Irregular GI: No hepatosplenomegaly Vascular: No leg edema  Skin: Ecchymoses at the forearms abdominal wall injection sites  Lab Results:  Lab Results  Component Value Date   WBC 4.5 02/27/2022   HGB 11.4 (L) 02/27/2022   HCT 36.0 (L) 02/27/2022   MCV 85.3 02/27/2022   PLT 284 02/27/2022   NEUTROABS 3.0 02/27/2022    CMP  Lab Results  Component Value Date   NA 139 02/27/2022   K 4.7 02/27/2022   CL 107 02/27/2022   CO2 22 02/27/2022   GLUCOSE 194 (H) 02/27/2022   BUN 29 (H) 02/27/2022   CREATININE 1.99 (H) 02/27/2022   CALCIUM 9.4 02/27/2022   PROT 7.1 02/27/2022   ALBUMIN 3.7 02/27/2022   AST 19 02/27/2022   ALT 15 02/27/2022   ALKPHOS 63 02/27/2022   BILITOT 0.5 02/27/2022   GFRNONAA 36 (L) 02/27/2022   GFRAA 49 (L) 11/20/2020       Medications: I have reviewed the patient's current medications.   Assessment/Plan: Hepatocellular carcinoma CT angio chest aorta 01/30/2021-no evidence of thoracic aortic aneurysm, diffuse hepatic steatosis, suggestion of early cirrhosis, 7.1 cm enhancing masslike area in segment 5/6, prominent gastrohepatic and periportal nodes measuring up to 7 mm MRI liver 03/22/2021-arterial hyperenhancing subcapsular mass, segment 6, 7 x 5.1 cm, LI-RADS 5, no pathologically large lymph nodes Ultrasound-guided biopsy 04/24/2021-hepatocellular carcinoma Y90 radioembolization of segment 6 and segment 7 hepatic arterial branches 06/21/2021 MRI abdomen 09/03/2021-new  increased enhancement inferior and superior to the dominant right liver lesion, new 11 mm enhancing lesion in the left liver LIRADS 3, dominant right hepatic lesion stable in size MRI abdomen 12/19/2021-unchanged hyperenhancing mass in the anterior inferior right liver measuring 7.8 x 5 cm, interval enlargement of a arterial enhancing lesion in segment 2- LIRADS 5, new small enhancing lesions hepatic segment 4A measuring 1.2 x 0.9 cm and 0.8 x 0.6 cm- LIRADS 5 Cycle 1 atezolizumab /bevacizumab 02/07/2022 Cycle 2 atezolizumab/bevacizumab 02/27/2022 Ulcerative colitis-maintained on vedolizumab Peripheral arterial disease History of a thoracic aortic aneurysm CAD Diabetes Hypertension Chronic renal failure Paroxysmal atrial fibrillation    Disposition: Matthew Chen tolerated the first treatment with atezolizumab/bevacizumab well.  He will complete cycle 2 today.  He will return for an office visit and treatment in 3 weeks.  He will be referred for a restaging MRI abdomen after cycle 4.  Cycle 4 will be delayed until 04/22/2022 due to a planned vacation.  Betsy Coder, MD  02/27/2022  11:02 AM

## 2022-03-04 ENCOUNTER — Other Ambulatory Visit: Payer: Self-pay

## 2022-03-06 ENCOUNTER — Ambulatory Visit: Payer: Self-pay

## 2022-03-06 NOTE — Telephone Encounter (Signed)
    Chief Complaint: Fasting BS 194 this morning. Has been 97-100 since stopping Glipizide. This is only elevated reading. Symptoms: No symptoms Frequency: Today Pertinent Negatives: Patient denies  Disposition: [] ED /[] Urgent Care (no appt availability in office) / [] Appointment(In office/virtual)/ []  [facility]/ [] Home Care/ [] Refused Recommended Disposition /[]  Mobile Bus/ [x]  Follow-up with PCP Additional Notes: Wants PCP to be aware. Please advise pt.  Answer Assessment - Initial Assessment Questions 1. BLOOD GLUCOSE: "What is your blood glucose level?"      194 2. ONSET: "When did you check the blood glucose?"     Today before breakfast 3. USUAL RANGE: "What is your glucose level usually?" (e.g., usual fasting morning value, usual evening value)     97-199 4. KETONES: "Do you check for ketones (urine or blood test strips)?" If Yes, ask: "What does the test show now?"      No 5. TYPE 1 or 2:  "Do you know what type of diabetes you have?"  (e.g., Type 1, Type 2, Gestational; doesn't know)      Type 2 6. INSULIN: "Do you take insulin?" "What type of insulin(s) do you use? What is the mode of delivery? (syringe, pen; injection or pump)?"      No 7. DIABETES PILLS: "Do you take any pills for your diabetes?" If Yes, ask: "Have you missed taking any pills recently?"     Yes 8. OTHER SYMPTOMS: "Do you have any symptoms?" (e.g., fever, frequent urination, difficulty breathing, dizziness, weakness, vomiting)     No 9. PREGNANCY: "Is there any chance you are pregnant?" "When was your last menstrual period?"     N/a  Protocols used: Diabetes - High Blood Sugar-A-AH

## 2022-03-07 ENCOUNTER — Telehealth: Payer: Self-pay

## 2022-03-07 NOTE — Telephone Encounter (Signed)
Spoke w/pt this morning. Per pt feels decent since he has been off of glipizide, states that he  feels a tired off and on but pt believes that it has to do w/his cancer.   Checked his BS this morning: 108, feels fine.

## 2022-03-08 NOTE — Telephone Encounter (Signed)
Spoke w/pt this morning re glipizide, Per Dr. Dennard Schaumann continue to hold medication (don't take it). Pt Voiced understanding. Nothing at this time.

## 2022-03-17 ENCOUNTER — Other Ambulatory Visit: Payer: Self-pay | Admitting: Oncology

## 2022-03-18 ENCOUNTER — Ambulatory Visit (HOSPITAL_COMMUNITY)
Admission: RE | Admit: 2022-03-18 | Discharge: 2022-03-18 | Disposition: A | Discharge status: Home / Self Care | Payer: HMO | Source: Ambulatory Visit | Attending: Cardiovascular Disease | Admitting: Cardiovascular Disease

## 2022-03-18 DIAGNOSIS — I10 Essential (primary) hypertension: Secondary | ICD-10-CM

## 2022-03-18 DIAGNOSIS — Q231 Congenital insufficiency of aortic valve: Secondary | ICD-10-CM | POA: Diagnosis not present

## 2022-03-18 DIAGNOSIS — I2510 Atherosclerotic heart disease of native coronary artery without angina pectoris: Secondary | ICD-10-CM | POA: Diagnosis not present

## 2022-03-18 DIAGNOSIS — Z87891 Personal history of nicotine dependence: Secondary | ICD-10-CM | POA: Insufficient documentation

## 2022-03-18 DIAGNOSIS — E785 Hyperlipidemia, unspecified: Secondary | ICD-10-CM | POA: Insufficient documentation

## 2022-03-18 DIAGNOSIS — I4891 Unspecified atrial fibrillation: Secondary | ICD-10-CM | POA: Insufficient documentation

## 2022-03-18 LAB — ECHOCARDIOGRAM COMPLETE
AR max vel: 2.25 cm2
AV Area VTI: 2.37 cm2
AV Area mean vel: 2.27 cm2
AV Mean grad: 11 mmHg
AV Peak grad: 21 mmHg
Ao pk vel: 2.29 m/s
Area-P 1/2: 3.17 cm2
MV M vel: 4.56 m/s
MV Peak grad: 83.2 mmHg
Radius: 0.8 cm
S' Lateral: 3.1 cm

## 2022-03-21 ENCOUNTER — Inpatient Hospital Stay: Payer: HMO | Attending: Oncology

## 2022-03-21 ENCOUNTER — Inpatient Hospital Stay (HOSPITAL_BASED_OUTPATIENT_CLINIC_OR_DEPARTMENT_OTHER): Payer: HMO | Admitting: Oncology

## 2022-03-21 ENCOUNTER — Other Ambulatory Visit: Payer: Self-pay | Admitting: *Deleted

## 2022-03-21 ENCOUNTER — Inpatient Hospital Stay: Payer: HMO

## 2022-03-21 ENCOUNTER — Encounter: Payer: Self-pay | Admitting: *Deleted

## 2022-03-21 VITALS — BP 112/86 | HR 90 | Temp 98.1°F | Resp 20 | Ht 70.0 in | Wt 228.0 lb

## 2022-03-21 VITALS — BP 130/75 | HR 62 | Resp 18

## 2022-03-21 DIAGNOSIS — Z5112 Encounter for antineoplastic immunotherapy: Secondary | ICD-10-CM | POA: Insufficient documentation

## 2022-03-21 DIAGNOSIS — C220 Liver cell carcinoma: Secondary | ICD-10-CM

## 2022-03-21 DIAGNOSIS — Z79899 Other long term (current) drug therapy: Secondary | ICD-10-CM | POA: Diagnosis not present

## 2022-03-21 LAB — CMP (CANCER CENTER ONLY)
ALT: 14 U/L (ref 0–44)
AST: 20 U/L (ref 15–41)
Albumin: 3.6 g/dL (ref 3.5–5.0)
Alkaline Phosphatase: 60 U/L (ref 38–126)
Anion gap: 9 (ref 5–15)
BUN: 24 mg/dL — ABNORMAL HIGH (ref 8–23)
CO2: 22 mmol/L (ref 22–32)
Calcium: 8.9 mg/dL (ref 8.9–10.3)
Chloride: 107 mmol/L (ref 98–111)
Creatinine: 1.78 mg/dL — ABNORMAL HIGH (ref 0.61–1.24)
GFR, Estimated: 42 mL/min — ABNORMAL LOW (ref >60)
Glucose, Bld: 170 mg/dL — ABNORMAL HIGH (ref 70–99)
Potassium: 4.4 mmol/L (ref 3.5–5.1)
Sodium: 138 mmol/L (ref 135–145)
Total Bilirubin: 0.6 mg/dL (ref 0.3–1.2)
Total Protein: 7 g/dL (ref 6.5–8.1)

## 2022-03-21 LAB — CBC WITH DIFFERENTIAL (CANCER CENTER ONLY)
Abs Immature Granulocytes: 0.01 10*3/uL (ref 0.00–0.07)
Basophils Absolute: 0.1 10*3/uL (ref 0.0–0.1)
Basophils Relative: 1 %
Eosinophils Absolute: 0.2 10*3/uL (ref 0.0–0.5)
Eosinophils Relative: 4 %
HCT: 38.7 % — ABNORMAL LOW (ref 39.0–52.0)
Hemoglobin: 12 g/dL — ABNORMAL LOW (ref 13.0–17.0)
Immature Granulocytes: 0 %
Lymphocytes Relative: 17 %
Lymphs Abs: 0.7 10*3/uL (ref 0.7–4.0)
MCH: 25.9 pg — ABNORMAL LOW (ref 26.0–34.0)
MCHC: 31 g/dL (ref 30.0–36.0)
MCV: 83.6 fL (ref 80.0–100.0)
Monocytes Absolute: 0.7 10*3/uL (ref 0.1–1.0)
Monocytes Relative: 15 %
Neutro Abs: 2.7 10*3/uL (ref 1.7–7.7)
Neutrophils Relative %: 63 %
Platelet Count: 245 10*3/uL (ref 150–400)
RBC: 4.63 MIL/uL (ref 4.22–5.81)
RDW: 17.3 % — ABNORMAL HIGH (ref 11.5–15.5)
WBC Count: 4.3 10*3/uL (ref 4.0–10.5)
nRBC: 0 % (ref 0.0–0.2)

## 2022-03-21 LAB — TSH: TSH: 1.436 u[IU]/mL (ref 0.350–4.500)

## 2022-03-21 LAB — TOTAL PROTEIN, URINE DIPSTICK: Protein, ur: NEGATIVE mg/dL

## 2022-03-21 MED ORDER — SODIUM CHLORIDE 0.9 % IV SOLN: Freq: Once | INTRAVENOUS | Status: AC

## 2022-03-21 MED ORDER — SODIUM CHLORIDE 0.9 % IV SOLN
1200.0000 mg | Freq: Once | INTRAVENOUS | Status: AC
  Administered 2022-03-21: 1200 mg via INTRAVENOUS
  Filled 2022-03-21: qty 20

## 2022-03-21 MED ORDER — SODIUM CHLORIDE 0.9 % IV SOLN
15.0000 mg/kg | Freq: Once | INTRAVENOUS | Status: AC
  Administered 2022-03-21: 1600 mg via INTRAVENOUS
  Filled 2022-03-21: qty 64

## 2022-03-21 NOTE — Progress Notes (Signed)
[place] OFFICE PROGRESS NOTE   Diagnosis: Hepatocellular carcinoma  INTERVAL HISTORY:   Matthew Chen completed another cycle of atezolizumab/bevacizumab on 02/27/2022.  No rash, bleeding, or symptom of thrombosis.  No change in baseline diarrhea.  He reports dyspnea for the past 6 months.  He had an echocardiogram 03/18/2022 and is scheduled to see cardiology later this month.  Objective:  Vital signs in last 24 hours:  Blood pressure 112/86, pulse 90, temperature 98.1 F (36.7 C), temperature source Oral, resp. rate 20, height 5' 10"  (1.778 m), weight 228 lb (103.4 kg), SpO2 100 %.    HEENT: No thrush or ulcers Resp: Lungs clear bilaterally Cardio: Irregular GI: No hepatosplenomegaly, no apparent ascites, nontender Vascular: No leg edema   Lab Results:  Lab Results  Component Value Date   WBC 4.3 03/21/2022   HGB 12.0 (L) 03/21/2022   HCT 38.7 (L) 03/21/2022   MCV 83.6 03/21/2022   PLT 245 03/21/2022   NEUTROABS 2.7 03/21/2022    CMP  Lab Results  Component Value Date   NA 139 02/27/2022   K 4.7 02/27/2022   CL 107 02/27/2022   CO2 22 02/27/2022   GLUCOSE 194 (H) 02/27/2022   BUN 29 (H) 02/27/2022   CREATININE 1.99 (H) 02/27/2022   CALCIUM 9.4 02/27/2022   PROT 7.1 02/27/2022   ALBUMIN 3.7 02/27/2022   AST 19 02/27/2022   ALT 15 02/27/2022   ALKPHOS 63 02/27/2022   BILITOT 0.5 02/27/2022   GFRNONAA 36 (L) 02/27/2022   GFRAA 49 (L) 11/20/2020    Lab Results  Component Value Date   CEA1 1.9 06/21/2021    Lab Results  Component Value Date   INR 1.2 (H) 12/13/2021   LABPROT 12.3 (H) 12/13/2021    Imaging:  ECHOCARDIOGRAM COMPLETE  Result Date: 03/18/2022    ECHOCARDIOGRAM REPORT   Patient Name:   Matthew Chen Date of Exam: 03/18/2022 Medical Rec #:  [id number]    Height:       70.0 in Accession #:    [id number]   Weight:       233.2 lb Date of Birth:  02-Jun-1956     BSA:          2.228 m Patient Age:    66 years     BP:           106/68  mmHg Patient Gender: M            HR:           58 bpm. Exam Location:  Outpatient Procedure: 2D Echo, 3D Echo, Cardiac Doppler, Color Doppler and Strain Analysis Indications:    Q23.1 Bicuspid aortic valve  History:        Patient has prior history of Echocardiogram examinations, most                 recent 03/13/2021. CAD, PAD, Arrythmias:Atrial Fibrillation; Risk                 Factors:Former Smoker, Hypertension and Dyslipidemia. Previous                 echo revealed LVEF 60-65%, moderate BAE, trivial MR mild AS-mean                 gradient 9.7 mmHg trivial AR and mildy dilated asc aorta                 measurning 40 mm.  Sonographer:  Lenard Galloway BA, RDCS Referring Phys: [place]  1. Left ventricular ejection fraction, by estimation, is 65 to 70%. Left ventricular ejection fraction by 3D volume is 69 %. The left ventricle has normal function. The left ventricle has no regional wall motion abnormalities. Left ventricular diastolic  parameters are indeterminate. Elevated left atrial pressure. The average left ventricular global longitudinal strain is -22.1 %. The global longitudinal strain is normal.  2. Right ventricular systolic function is normal. The right ventricular size is normal. There is normal pulmonary artery systolic pressure. The estimated right ventricular systolic pressure is 10.1 mmHg.  3. The mitral valve is grossly normal. Mild mitral valve regurgitation.  4. The aortic valve is calcified. Aortic valve regurgitation is not visualized. Aortic valve mean gradient measures 11.0 mmHg.  5. Aortic dilatation noted. There is mild dilatation of the aortic root, measuring 41 mm. Comparison(s): Prior images reviewed side by side. Similar gradients and aortic sizing from prior. In prior images, called functionally bicuspid vs trileaflet based of the PSAX imaging. Similar morphology from 2022. FINDINGS  Left Ventricle: Left ventricular ejection fraction, by estimation, is 65  to 70%. Left ventricular ejection fraction by 3D volume is 69 %. The left ventricle has normal function. The left ventricle has no regional wall motion abnormalities. The average left ventricular global longitudinal strain is -22.1 %. The global longitudinal strain is normal. The left ventricular internal cavity size was normal in size. There is no left ventricular hypertrophy. Left ventricular diastolic parameters are indeterminate. Elevated left atrial pressure. Right Ventricle: The right ventricular size is normal. No increase in right ventricular wall thickness. Right ventricular systolic function is normal. There is normal pulmonary artery systolic pressure. The tricuspid regurgitant velocity is 2.14 m/s, and  with an assumed right atrial pressure of 3 mmHg, the estimated right ventricular systolic pressure is 75.1 mmHg. Left Atrium: Left atrial size was normal in size. Right Atrium: Right atrial size was normal in size. Pericardium: There is no evidence of pericardial effusion. Mitral Valve: The mitral valve is grossly normal. Mild to moderate mitral annular calcification. Mild mitral valve regurgitation. Tricuspid Valve: The tricuspid valve is normal in structure. Tricuspid valve regurgitation is not demonstrated. No evidence of tricuspid stenosis. Aortic Valve: The aortic valve is calcified. Aortic valve regurgitation is not visualized. Aortic valve mean gradient measures 11.0 mmHg. Aortic valve peak gradient measures 21.0 mmHg. Aortic valve area, by VTI measures 2.37 cm. Pulmonic Valve: The pulmonic valve was normal in structure. Pulmonic valve regurgitation is mild. No evidence of pulmonic stenosis. Aorta: Aortic dilatation noted and the ascending aorta was not well visualized. There is mild dilatation of the aortic root, measuring 41 mm. IAS/Shunts: No atrial level shunt detected by color flow Doppler.  LEFT VENTRICLE PLAX 2D LVIDd:         4.30 cm         Diastology LVIDs:         3.10 cm         LV e'  medial:    5.33 cm/s LV PW:         0.90 cm         LV E/e' medial:  22.1 LV IVS:        1.00 cm         LV e' lateral:   6.85 cm/s LVOT diam:     2.55 cm         LV E/e' lateral: 17.2 LV SV:  133 LV SV Index:   60              2D LVOT Area:     5.11 cm        Longitudinal                                Strain                                2D Strain GLS  -23.2 %                                (A2C):                                2D Strain GLS  -23.9 %                                (A3C):                                2D Strain GLS  -19.4 %                                (A4C):                                2D Strain GLS  -22.1 %                                Avg:                                 3D Volume EF                                LV 3D EF:    Left                                             ventricul                                             ar                                             ejection                                             fraction  by 3D                                             volume is                                             69 %.                                 3D Volume EF:                                3D EF:        69 %                                LV EDV:       136 ml                                LV ESV:       43 ml                                LV SV:        94 ml RIGHT VENTRICLE             IVC RV Basal diam:  3.60 cm     IVC diam: 1.60 cm RV Mid diam:    3.40 cm RV S prime:     16.20 cm/s TAPSE (M-mode): 2.5 cm RVSP:           21.3 mmHg LEFT ATRIUM             Index        RIGHT ATRIUM           Index LA diam:        4.60 cm 2.06 cm/m   RA Pressure: 3.00 mmHg LA Vol (A2C):   47.2 ml 21.18 ml/m  RA Area:     22.00 cm LA Vol (A4C):   54.3 ml 24.37 ml/m  RA Volume:   74.50 ml  33.44 ml/m LA Biplane Vol: 51.9 ml 23.29 ml/m  AORTIC VALVE                     PULMONIC VALVE AV Area (Vmax):    2.25 cm      PR  End Diast Vel: 3.56 msec AV Area (Vmean):   2.27 cm AV Area (VTI):     2.37 cm AV Vmax:           229.00 cm/s AV Vmean:          154.000 cm/s AV VTI:            0.562 m AV Peak Grad:      21.0 mmHg AV Mean Grad:      11.0 mmHg LVOT Vmax:         101.00 cm/s LVOT Vmean:  68.400 cm/s LVOT VTI:          0.261 m LVOT/AV VTI ratio: 0.46  AORTA Ao Root diam: 4.10 cm Ao Asc diam:  4.00 cm MITRAL VALVE                  TRICUSPID VALVE MV Area (PHT): 3.17 cm       TR Peak grad:   18.3 mmHg MV Decel Time: 239 msec       TR Vmax:        214.00 cm/s MR Peak grad:    83.2 mmHg    Estimated RAP:  3.00 mmHg MR Mean grad:    58.0 mmHg    RVSP:           21.3 mmHg MR Vmax:         456.00 cm/s MR Vmean:        358.0 cm/s   SHUNTS MR PISA:         4.02 cm     Systemic VTI:  0.26 m MR PISA Eff ROA: 30 mm       Systemic Diam: 2.55 cm MR PISA Radius:  0.80 cm MV E velocity: 118.00 cm/s MV A velocity: 39.30 cm/s MV E/A ratio:  3.00 Rudean Haskell MD Electronically signed by Rudean Haskell MD Signature Date/Time: 03/18/2022/10:35:44 AM    Final     Medications: I have reviewed the patient's current medications.   Assessment/Plan: Hepatocellular carcinoma CT angio chest aorta 01/30/2021-no evidence of thoracic aortic aneurysm, diffuse hepatic steatosis, suggestion of early cirrhosis, 7.1 cm enhancing masslike area in segment 5/6, prominent gastrohepatic and periportal nodes measuring up to 7 mm MRI liver 03/22/2021-arterial hyperenhancing subcapsular mass, segment 6, 7 x 5.1 cm, LI-RADS 5, no pathologically large lymph nodes Ultrasound-guided biopsy 04/24/2021-hepatocellular carcinoma Y90 radioembolization of segment 6 and segment 7 hepatic arterial branches 06/21/2021 MRI abdomen 09/03/2021-new increased enhancement inferior and superior to the dominant right liver lesion, new 11 mm enhancing lesion in the left liver LIRADS 3, dominant right hepatic lesion stable in size MRI abdomen 12/19/2021-unchanged  hyperenhancing mass in the anterior inferior right liver measuring 7.8 x 5 cm, interval enlargement of a arterial enhancing lesion in segment 2- LIRADS 5, new small enhancing lesions hepatic segment 4A measuring 1.2 x 0.9 cm and 0.8 x 0.6 cm- LIRADS 5 Cycle 1 atezolizumab /bevacizumab 02/07/2022 Cycle 2 atezolizumab/bevacizumab 02/27/2022 Cycle 3 atezolizumab/bevacizumab 03/21/2022 Ulcerative colitis-maintained on vedolizumab Peripheral arterial disease History of a thoracic aortic aneurysm CAD Diabetes Hypertension Chronic renal failure Paroxysmal atrial fibrillation      Disposition: Mr. Menees appears stable.  He is tolerating the atezolizumab/bevacizumab well.  He will complete cycle 3 today.  He is scheduled for a vacation later this month.  He will return for cycle 4 on 04/22/2022.  He will be scheduled for a restaging MRI abdomen after cycle 4.  He will follow-up with cardiology to evaluate the dyspnea.  Betsy Coder, MD  03/21/2022  8:55 AM

## 2022-03-21 NOTE — Progress Notes (Signed)
Patient seen by Dr. Benay Spice today  Vitals are within treatment parameters.  Labs reviewed by Dr. Benay Spice and are not all within treatment parameters. Creatinine 1.78--OK to proceed  Per physician team, patient is ready for treatment and there are NO modifications to the treatment plan.

## 2022-03-21 NOTE — Patient Instructions (Signed)
[facility]   Discharge Instructions: Thank you for choosing [facility] to provide your oncology and hematology care.   If you have a lab appointment with the [facility], please go directly to the [facility] and check in at the registration area.   Wear comfortable clothing and clothing appropriate for easy access to any Portacath or PICC line.   We strive to give you quality time with your provider. You may need to reschedule your appointment if you arrive late (15 or more minutes).  Arriving late affects you and other patients whose appointments are after yours.  Also, if you miss three or more appointments without notifying the office, you may be dismissed from the clinic at the provider's discretion.      For prescription refill requests, have your pharmacy contact our office and allow 72 hours for refills to be completed.    Today you received the following chemotherapy and/or immunotherapy agents Tecentriq, Bevacizumab.      To help prevent nausea and vomiting after your treatment, we encourage you to take your nausea medication as directed.  BELOW ARE SYMPTOMS THAT SHOULD BE REPORTED IMMEDIATELY: *FEVER GREATER THAN 100.4 F (38 C) OR HIGHER *CHILLS OR SWEATING *NAUSEA AND VOMITING THAT IS NOT CONTROLLED WITH YOUR NAUSEA MEDICATION *UNUSUAL SHORTNESS OF BREATH *UNUSUAL BRUISING OR BLEEDING *URINARY PROBLEMS (pain or burning when urinating, or frequent urination) *BOWEL PROBLEMS (unusual diarrhea, constipation, pain near the anus) TENDERNESS IN MOUTH AND THROAT WITH OR WITHOUT PRESENCE OF ULCERS (sore throat, sores in mouth, or a toothache) UNUSUAL RASH, SWELLING OR PAIN  UNUSUAL VAGINAL DISCHARGE OR ITCHING   Items with * indicate a potential emergency and should be followed up as soon as possible or go to the Emergency Department if any problems should occur.  Please show the CHEMOTHERAPY ALERT CARD or IMMUNOTHERAPY ALERT CARD at  check-in to the Emergency Department and triage nurse.  Should you have questions after your visit or need to cancel or reschedule your appointment, please contact [facility]  Dept: [phone]  and follow the prompts.  Office hours are 8:00 a.m. to 4:30 p.m. Monday - Friday. Please note that voicemails left after 4:00 p.m. may not be returned until the following business day.  We are closed weekends and major holidays. You have access to a nurse at all times for urgent questions. Please call the main number to the clinic Dept: [phone] and follow the prompts.   For any non-urgent questions, you may also contact your provider using MyChart. We now offer e-Visits for anyone 73 and older to request care online for non-urgent symptoms. For details visit [email or web address].   Also download the MyChart app! Go to the app store, search "MyChart", open the app, select [facility], and log in with your MyChart username and password.  Masks are optional in the cancer centers. If you would like for your care team to wear a mask while they are taking care of you, please let them know. You may have one support person who is at least 66 years old accompany you for your appointments.  Atezolizumab Injection What is this medication? ATEZOLIZUMAB (a te zoe LIZ ue mab) treats some types of cancer. It works by helping your immune system slow or stop the spread of cancer cells. It is a monoclonal antibody. This medicine may be used for other purposes; ask your health care provider or pharmacist if you have questions. COMMON BRAND  NAME(S): Tecentriq What should I tell my care team before I take this medication? They need to know if you have any of these conditions: Allogeneic stem cell transplant (uses someone else's stem cells) Autoimmune diseases, such as Crohn disease, ulcerative colitis, lupus History of chest radiation Nervous system problems, such as Guillain-Barre  syndrome, myasthenia gravis Organ transplant An unusual or allergic reaction to atezolizumab, other medications, foods, dyes, or preservatives Pregnant or trying to get pregnant Breast-feeding How should I use this medication? This medication is injected into a vein. It is given by your care team in a hospital or clinic setting. A special MedGuide will be given to you before each treatment. Be sure to read this information carefully each time. Talk to your care team about the use of this medication in children. While it may be prescribed for children as young as 2 years for selected conditions, precautions do apply. Overdosage: If you think you have taken too much of this medicine contact a poison control center or emergency room at once. NOTE: This medicine is only for you. Do not share this medicine with others. What if I miss a dose? Keep appointments for follow-up doses. It is important not to miss your dose. Call your care team if you are unable to keep an appointment. What may interact with this medication? Interactions have not been studied. This list may not describe all possible interactions. Give your health care provider a list of all the medicines, herbs, non-prescription drugs, or dietary supplements you use. Also tell them if you smoke, drink alcohol, or use illegal drugs. Some items may interact with your medicine. What should I watch for while using this medication? Your condition will be monitored carefully while you are receiving this medication. You may need blood work while taking this medication. This medication may cause serious skin reactions. They can happen weeks to months after starting the medication. Contact your care team right away if you notice fevers or flu-like symptoms with a rash. The rash may be red or purple and then turn into blisters or peeling of the skin. You may also notice a red rash with swelling of the face, lips, or lymph nodes in your neck or under  your arms. Tell your care team right away if you have any change in your eyesight. Talk to your care team if you may be pregnant. Serious birth defects can occur if you take this medication during pregnancy and for 5 months after the last dose. You will need a negative pregnancy test before starting this medication. Contraception is recommended while taking this medication and for 5 months after the last dose. Your care team can help you find the option that works for you. Do not breastfeed while taking this medication and for at least 5 months after the last dose. What side effects may I notice from receiving this medication? Side effects that you should report to your doctor or health care professional as soon as possible: Allergic reactions--skin rash, itching, hives, swelling of the face, lips, tongue, or throat Dry cough, shortness of breath or trouble breathing Eye pain, redness, irritation, or discharge with blurry or decreased vision Heart muscle inflammation--unusual weakness or fatigue, shortness of breath, chest pain, fast or irregular heartbeat, dizziness, swelling of the ankles, feet, or hands Hormone gland problems--headache, sensitivity to light, unusual weakness or fatigue, dizziness, fast or irregular heartbeat, increased sensitivity to cold or heat, excessive sweating, constipation, hair loss, increased thirst or amount of urine, tremors or  shaking, irritability Infusion reactions--chest pain, shortness of breath or trouble breathing, feeling faint or lightheaded Kidney injury (glomerulonephritis)--decrease in the amount of urine, red or dark brown urine, foamy or bubbly urine, swelling of the ankles, hands, or feet Liver injury--right upper belly pain, loss of appetite, nausea, light-colored stool, dark yellow or brown urine, yellowing skin or eyes, unusual weakness or fatigue Pain, tingling, or numbness in the hands or feet, muscle weakness, change in vision, confusion or trouble  speaking, loss of balance or coordination, trouble walking, seizures Rash, fever, and swollen lymph nodes Redness, blistering, peeling, or loosening of the skin, including inside the mouth Sudden or severe stomach pain, bloody diarrhea, fever, nausea, vomiting Side effects that usually do not require medical attention (report to your doctor or health care professional if they continue or are bothersome): Bone, joint, or muscle pain Diarrhea Fatigue Loss of appetite Nausea Skin rash This list may not describe all possible side effects. Call your doctor for medical advice about side effects. You may report side effects to FDA at 1-800-FDA-1088. Where should I keep my medication? This medication is given in a hospital or clinic. It will not be stored at home. NOTE: This sheet is a summary. It may not cover all possible information. If you have questions about this medicine, talk to your doctor, pharmacist, or health care provider.  2023 Elsevier/Gold Standard (2021-12-11 00:00:00)  Bevacizumab Injection What is this medication? BEVACIZUMAB (be va SIZ yoo mab) treats some types of cancer. It works by blocking a protein that causes cancer cells to grow and multiply. This helps to slow or stop the spread of cancer cells. It is a monoclonal antibody. This medicine may be used for other purposes; ask your health care provider or pharmacist if you have questions. COMMON BRAND NAME(S): Alymsys, Avastin, MVASI, Noah Charon What should I tell my care team before I take this medication? They need to know if you have any of these conditions: Blood clots Coughing up blood Having or recent surgery Heart failure High blood pressure History of a connection between 2 or more body parts that do not usually connect (fistula) History of a tear in your stomach or intestines Protein in your urine An unusual or allergic reaction to bevacizumab, other medications, foods, dyes, or preservatives Pregnant or trying  to get pregnant Breast-feeding How should I use this medication? This medication is injected into a vein. It is given by your care team in a hospital or clinic setting. Talk to your care team the use of this medication in children. Special care may be needed. Overdosage: If you think you have taken too much of this medicine contact a poison control center or emergency room at once. NOTE: This medicine is only for you. Do not share this medicine with others. What if I miss a dose? Keep appointments for follow-up doses. It is important not to miss your dose. Call your care team if you are unable to keep an appointment. What may interact with this medication? Interactions are not expected. This list may not describe all possible interactions. Give your health care provider a list of all the medicines, herbs, non-prescription drugs, or dietary supplements you use. Also tell them if you smoke, drink alcohol, or use illegal drugs. Some items may interact with your medicine. What should I watch for while using this medication? Your condition will be monitored carefully while you are receiving this medication. You may need blood work while taking this medication. This medication may make you  feel generally unwell. This is not uncommon as chemotherapy can affect healthy cells as well as cancer cells. Report any side effects. Continue your course of treatment even though you feel ill unless your care team tells you to stop. This medication may increase your risk to bruise or bleed. Call your care team if you notice any unusual bleeding. Before having surgery, talk to your care team to make sure it is ok. This medication can increase the risk of poor healing of your surgical site or wound. You will need to stop this medication for 28 days before surgery. After surgery, wait at least 28 days before restarting this medication. Make sure the surgical site or wound is healed enough before restarting this  medication. Talk to your care team if questions. Talk to your care team if you may be pregnant. Serious birth defects can occur if you take this medication during pregnancy and for 6 months after the last dose. Contraception is recommended while taking this medication and for 6 months after the last dose. Your care team can help you find the option that works for you. Do not breastfeed while taking this medication and for 6 months after the last dose. This medication can cause infertility. Talk to your care team if you are concerned about your fertility. What side effects may I notice from receiving this medication? Side effects that you should report to your care team as soon as possible: Allergic reactions--skin rash, itching, hives, swelling of the face, lips, tongue, or throat Bleeding--bloody or black, tar-like stools, vomiting blood or brown material that looks like coffee grounds, red or dark brown urine, small red or purple spots on skin, unusual bruising or bleeding Blood clot--pain, swelling, or warmth in the leg, shortness of breath, chest pain Heart attack--pain or tightness in the chest, shoulders, arms, or jaw, nausea, shortness of breath, cold or clammy skin, feeling faint or lightheaded Heart failure--shortness of breath, swelling of the ankles, feet, or hands, sudden weight gain, unusual weakness or fatigue Increase in blood pressure Infection--fever, chills, cough, sore throat, wounds that don't heal, pain or trouble when passing urine, general feeling of discomfort or being unwell Infusion reactions--chest pain, shortness of breath or trouble breathing, feeling faint or lightheaded Kidney injury--decrease in the amount of urine, swelling of the ankles, hands, or feet Stomach pain that is severe, does not go away, or gets worse Stroke--sudden numbness or weakness of the face, arm, or leg, trouble speaking, confusion, trouble walking, loss of balance or coordination, dizziness,  severe headache, change in vision Sudden and severe headache, confusion, change in vision, seizures, which may be signs of posterior reversible encephalopathy syndrome (PRES) Side effects that usually do not require medical attention (report to your care team if they continue or are bothersome): Back pain Change in taste Diarrhea Dry skin Increased tears Nosebleed This list may not describe all possible side effects. Call your doctor for medical advice about side effects. You may report side effects to FDA at 1-800-FDA-1088. Where should I keep my medication? This medication is given in a hospital or clinic. It will not be stored at home. NOTE: This sheet is a summary. It may not cover all possible information. If you have questions about this medicine, talk to your doctor, pharmacist, or health care provider.  2023 Elsevier/Gold Standard (2021-12-11 00:00:00)

## 2022-03-22 ENCOUNTER — Other Ambulatory Visit: Payer: Self-pay

## 2022-03-22 DIAGNOSIS — I10 Essential (primary) hypertension: Secondary | ICD-10-CM

## 2022-03-22 DIAGNOSIS — E785 Hyperlipidemia, unspecified: Secondary | ICD-10-CM

## 2022-03-22 MED ORDER — PANTOPRAZOLE SODIUM 40 MG PO TBEC: 40.0000 mg | DELAYED_RELEASE_TABLET | Freq: Two times a day (BID) | ORAL | 11 refills | Status: DC

## 2022-03-22 MED ORDER — FENOFIBRATE 160 MG PO TABS: 160.0000 mg | ORAL_TABLET | Freq: Every day | ORAL | 3 refills | Status: DC

## 2022-03-22 NOTE — Telephone Encounter (Signed)
Requested Prescriptions  Pending Prescriptions Disp Refills  . pantoprazole (PROTONIX) 40 MG tablet 60 tablet 11    Sig: Take 1 tablet (40 mg total) by mouth 2 (two) times daily.     Gastroenterology: Proton Pump Inhibitors Failed - 03/22/2022  1:50 PM      Failed - Valid encounter within last 12 months    Recent Outpatient Visits          1 year ago Type 2 diabetes mellitus with other specified complication, unspecified whether long term insulin use ([facility])   [place] Pickard, Cammie Mcgee, MD   1 year ago Type 2 diabetes mellitus with other specified complication, unspecified whether long term insulin use ([facility])   [place] Susy Frizzle, MD   2 years ago Benign essential HTN   [place] Susy Frizzle, MD   2 years ago Benign essential HTN   [place] Susy Frizzle, MD   3 years ago Benign essential HTN   [place], Cammie Mcgee, MD      Future Appointments            In 2 weeks [place], [place], [place] [facility] [place], [place]   In 2 months [place], [place], [facility] [place], [place]           . fenofibrate 160 MG tablet 90 tablet 3    Sig: Take 1 tablet (160 mg total) by mouth daily.     Cardiovascular:  Antilipid - Fibric Acid Derivatives Failed - 03/22/2022  1:50 PM      Failed - Cr in normal range and within 360 days    Creatinine  Date Value Ref Range Status  03/21/2022 1.78 (H) 0.61 - 1.24 mg/dL Final   Creat  Date Value Ref Range Status  02/08/2022 1.93 (H) 0.70 - 1.35 mg/dL Final         Failed - HGB in normal range and within 360 days    Hemoglobin  Date Value Ref Range Status  03/21/2022 12.0 (L) 13.0 - 17.0 g/dL Final  02/06/2021 12.6 (L) 13.0 - 17.7 g/dL Final         Failed - HCT in normal range and within 360 days    HCT  Date Value Ref Range Status  03/21/2022 38.7 (L) 39.0 - 52.0 % Final   Hematocrit  Date Value Ref Range Status   02/06/2021 37.9 37.5 - 51.0 % Final         Failed - Valid encounter within last 12 months    Recent Outpatient Visits          1 year ago Type 2 diabetes mellitus with other specified complication, unspecified whether long term insulin use ([place])   [place] Susy Frizzle, MD   1 year ago Type 2 diabetes mellitus with other specified complication, unspecified whether long term insulin use ([place] [facility])   [place] Pickard, Cammie Mcgee, MD   2 years ago Benign essential HTN   [place] Susy Frizzle, MD   2 years ago Benign essential HTN   [place], Warren T, MD   3 years ago Benign essential HTN   [place], Cammie Mcgee, MD      Future Appointments            In 2 weeks [place], [place], [place] [facility] [place], [place]  In 2 months [place], [place], [place] [facility], [facility]           Failed - Lipid Panel in normal range within the last 12 months    Cholesterol, Total  Date Value Ref Range Status  10/07/2016 119 100 - 199 mg/dL Final   Cholesterol  Date Value Ref Range Status  02/08/2022 95 <200 mg/dL Final   LDL Cholesterol (Calc)  Date Value Ref Range Status  02/08/2022 37 mg/dL (calc) Final    Comment:    Reference range: <100 . Desirable range <100 mg/dL for primary prevention;   <70 mg/dL for patients with CHD or diabetic patients  with > or = 2 CHD risk factors. Marland Kitchen LDL-C is now calculated using the Martin-Hopkins  calculation, which is a validated novel method providing  better accuracy than the Friedewald equation in the  estimation of LDL-C.  Cresenciano Genre et al. [name]. 2202;542(70): 2061-2068  (http://education.Quest[employer].com/faq/FAQ164)    HDL  Date Value Ref Range Status  02/08/2022 37 (L) > OR = 40 mg/dL Final  10/07/2016 26 (L) >39 mg/dL Final   Triglycerides  Date Value Ref Range Status  02/08/2022 125 <150 mg/dL Final          Passed - ALT in normal range and within 360 days    ALT  Date Value Ref Range Status  03/21/2022 14 0 - 44 U/L Final         Passed - AST in normal range and within 360 days    AST  Date Value Ref Range Status  03/21/2022 20 15 - 41 U/L Final         Passed - PLT in normal range and within 360 days    Platelets  Date Value Ref Range Status  02/06/2021 259 150 - 450 x10E3/uL Final   Platelet Count  Date Value Ref Range Status  03/21/2022 245 150 - 400 K/uL Final         Passed - WBC in normal range and within 360 days    WBC  Date Value Ref Range Status  02/08/2022 4.7 3.8 - 10.8 Thousand/uL Final   WBC Count  Date Value Ref Range Status  03/21/2022 4.3 4.0 - 10.5 K/uL Final         Passed - eGFR is 30 or above and within 360 days    GFR, Est African American  Date Value Ref Range Status  11/20/2020 49 (L) > OR = 60 mL/min/1.65m Final   GFR, Est Non African American  Date Value Ref Range Status  11/20/2020 42 (L) > OR = 60 mL/min/1.742mFinal   GFR, Estimated  Date Value Ref Range Status  03/21/2022 42 (L) >60 mL/min Final    Comment:    (NOTE) Calculated using the CKD-EPI Creatinine Equation (2021)    GFR  Date Value Ref Range Status  04/17/2021 34.75 (L) >60.00 mL/min Final    Comment:    Calculated using the CKD-EPI Creatinine Equation (2021)   eGFR  Date Value Ref Range Status  02/27/2022 39 (L) >59 mL/min/1.73 Final

## 2022-03-22 NOTE — Telephone Encounter (Signed)
Pharmacy faxed a refill request for pantoprazole (PROTONIX) 40 MG tablet [[id number]]    Order Details Dose: 40 mg Route: Oral Frequency: 2 times daily  Dispense Quantity: 60 tablet Refills: 11        Sig: Take 1 tablet (40 mg total) by mouth 2 (two) times daily.       Start Date: 03/19/21 End Date: --  Written Date: 03/19/21 Expiration Date: 03/19/22   fenofibrate 160 MG tablet [[id number]]    Order Details Dose, Route, Frequency: As Directed  Dispense Quantity: 90 tablet Refills: 0   Note to Pharmacy: [place] refill, must attend upcoming scheduled office visit prior to further refills.       Sig: TAKE 1 TABLET BY MOUTH EVERY DAY       Start Date: 02/05/22 End Date: --  Written Date: 02/05/22 Expiration Date: 02/05/23

## 2022-03-25 DIAGNOSIS — K5190 Ulcerative colitis, unspecified, without complications: Secondary | ICD-10-CM | POA: Diagnosis not present

## 2022-03-25 DIAGNOSIS — Z5111 Encounter for antineoplastic chemotherapy: Secondary | ICD-10-CM | POA: Diagnosis not present

## 2022-03-27 ENCOUNTER — Emergency Department (HOSPITAL_BASED_OUTPATIENT_CLINIC_OR_DEPARTMENT_OTHER): Payer: HMO | Admitting: Radiology

## 2022-03-27 ENCOUNTER — Other Ambulatory Visit (HOSPITAL_BASED_OUTPATIENT_CLINIC_OR_DEPARTMENT_OTHER): Payer: Self-pay

## 2022-03-27 ENCOUNTER — Other Ambulatory Visit: Payer: Self-pay

## 2022-03-27 ENCOUNTER — Encounter: Payer: Self-pay | Admitting: Oncology

## 2022-03-27 ENCOUNTER — Emergency Department (HOSPITAL_BASED_OUTPATIENT_CLINIC_OR_DEPARTMENT_OTHER)
Admission: EM | Admit: 2022-03-27 | Discharge: 2022-03-27 | Disposition: A | Discharge status: Home / Self Care | Payer: HMO | Attending: Emergency Medicine | Admitting: Emergency Medicine

## 2022-03-27 ENCOUNTER — Encounter (HOSPITAL_BASED_OUTPATIENT_CLINIC_OR_DEPARTMENT_OTHER): Payer: Self-pay

## 2022-03-27 DIAGNOSIS — Y9389 Activity, other specified: Secondary | ICD-10-CM | POA: Diagnosis not present

## 2022-03-27 DIAGNOSIS — Z7982 Long term (current) use of aspirin: Secondary | ICD-10-CM | POA: Diagnosis not present

## 2022-03-27 DIAGNOSIS — R0789 Other chest pain: Secondary | ICD-10-CM | POA: Diagnosis not present

## 2022-03-27 DIAGNOSIS — Z7984 Long term (current) use of oral hypoglycemic drugs: Secondary | ICD-10-CM | POA: Insufficient documentation

## 2022-03-27 DIAGNOSIS — S29011A Strain of muscle and tendon of front wall of thorax, initial encounter: Secondary | ICD-10-CM | POA: Diagnosis not present

## 2022-03-27 DIAGNOSIS — X500XXA Overexertion from strenuous movement or load, initial encounter: Secondary | ICD-10-CM | POA: Diagnosis not present

## 2022-03-27 DIAGNOSIS — I4891 Unspecified atrial fibrillation: Secondary | ICD-10-CM | POA: Diagnosis not present

## 2022-03-27 DIAGNOSIS — I2510 Atherosclerotic heart disease of native coronary artery without angina pectoris: Secondary | ICD-10-CM | POA: Insufficient documentation

## 2022-03-27 DIAGNOSIS — E119 Type 2 diabetes mellitus without complications: Secondary | ICD-10-CM | POA: Insufficient documentation

## 2022-03-27 DIAGNOSIS — Z794 Long term (current) use of insulin: Secondary | ICD-10-CM | POA: Insufficient documentation

## 2022-03-27 DIAGNOSIS — R079 Chest pain, unspecified: Secondary | ICD-10-CM | POA: Diagnosis not present

## 2022-03-27 DIAGNOSIS — Z7901 Long term (current) use of anticoagulants: Secondary | ICD-10-CM | POA: Insufficient documentation

## 2022-03-27 DIAGNOSIS — S29001A Unspecified injury of muscle and tendon of front wall of thorax, initial encounter: Secondary | ICD-10-CM | POA: Diagnosis present

## 2022-03-27 LAB — BASIC METABOLIC PANEL
Anion gap: 8 (ref 5–15)
BUN: 23 mg/dL (ref 8–23)
CO2: 23 mmol/L (ref 22–32)
Calcium: 8.9 mg/dL (ref 8.9–10.3)
Chloride: 108 mmol/L (ref 98–111)
Creatinine, Ser: 1.69 mg/dL — ABNORMAL HIGH (ref 0.61–1.24)
GFR, Estimated: 44 mL/min — ABNORMAL LOW (ref >60)
Glucose, Bld: 136 mg/dL — ABNORMAL HIGH (ref 70–99)
Potassium: 4.2 mmol/L (ref 3.5–5.1)
Sodium: 139 mmol/L (ref 135–145)

## 2022-03-27 LAB — CBC
HCT: 36.9 % — ABNORMAL LOW (ref 39.0–52.0)
Hemoglobin: 11.3 g/dL — ABNORMAL LOW (ref 13.0–17.0)
MCH: 25.7 pg — ABNORMAL LOW (ref 26.0–34.0)
MCHC: 30.6 g/dL (ref 30.0–36.0)
MCV: 83.9 fL (ref 80.0–100.0)
Platelets: 239 10*3/uL (ref 150–400)
RBC: 4.4 MIL/uL (ref 4.22–5.81)
RDW: 17.6 % — ABNORMAL HIGH (ref 11.5–15.5)
WBC: 4.2 10*3/uL (ref 4.0–10.5)
nRBC: 0 % (ref 0.0–0.2)

## 2022-03-27 LAB — TROPONIN I (HIGH SENSITIVITY): Troponin I (High Sensitivity): 13 ng/L (ref <18)

## 2022-03-27 MED ORDER — METHOCARBAMOL 500 MG PO TABS
500.0000 mg | ORAL_TABLET | Freq: Once | ORAL | Status: AC
  Administered 2022-03-27: 500 mg via ORAL
  Filled 2022-03-27: qty 1

## 2022-03-27 MED ORDER — METHOCARBAMOL 500 MG PO TABS
500.0000 mg | ORAL_TABLET | Freq: Two times a day (BID) | ORAL | 0 refills | Status: DC
  Filled 2022-03-27: qty 10, 5d supply, fill #0

## 2022-03-27 MED ORDER — NAPROXEN 250 MG PO TABS
375.0000 mg | ORAL_TABLET | Freq: Once | ORAL | Status: AC
  Administered 2022-03-27: 375 mg via ORAL
  Filled 2022-03-27: qty 2

## 2022-03-27 MED ORDER — ACETAMINOPHEN 500 MG PO TABS
500.0000 mg | ORAL_TABLET | Freq: Four times a day (QID) | ORAL | 0 refills | Status: DC | PRN
  Filled 2022-03-27: qty 30, 8d supply, fill #0

## 2022-03-27 MED ORDER — LIDOCAINE 4 % EX PTCH
1.0000 | MEDICATED_PATCH | Freq: Two times a day (BID) | CUTANEOUS | 0 refills | Status: DC
  Filled 2022-03-27: qty 10, 5d supply, fill #0

## 2022-03-27 NOTE — ED Triage Notes (Signed)
He c/o right lateral thoracic/ribs area discomfort since Sat. At which time he did some physically demanding work on his home.

## 2022-03-27 NOTE — Discharge Instructions (Addendum)
You are seen in the ER for chest pain.  Our clinical assessment is consistent with muscle strain.  X-ray of your chest does not reveal any fracture.  Given that you are on Eliquis, we recommend that you take Tylenol for 100 mg as needed for pain along with the muscle relaxant.  The muscle relaxant may make you drowsy, therefore be careful after using it when walking.  Additionally topical lidocaine patch has been prescribed.  Warm compresses over the area of pain also will be helpful.  We recommend no heavy lifting more than 10 pounds over the next week, and then slowly start resuming normal activity.

## 2022-03-27 NOTE — ED Provider Notes (Signed)
[facility] DEPT Provider Note   CSN: [id number] Arrival date & time: 03/27/22  0901     History  Chief Complaint  Patient presents with   Right thoracic pain    Matthew Chen is a 66 y.o. male.  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  Patient states that he moved some boxes during the morning of Saturday.  In the evening he started noticing some right-sided chest pain around his areola.  Pain is worse with any kind of activity and with palpation of the chest.  No history of similar symptoms in the past.  There is no new shortness of breath and the pain is not worse with deep inspiration.  Home Medications Prior to Admission medications   Medication Sig Start Date End Date Taking? Authorizing Provider  acetaminophen (TYLENOL) 500 MG tablet Take 1 tablet (500 mg total) by mouth every 6 (six) hours as needed. 03/27/22  Yes Lavontay Kirk, MD  lidocaine 4 % Place 1 patch onto the skin 2 (two) times daily. 03/27/22  Yes Varney Biles, MD  methocarbamol (ROBAXIN) 500 MG tablet Take 1 tablet (500 mg total) by mouth 2 (two) times daily. 03/27/22  Yes Varney Biles, MD  apixaban (ELIQUIS) 5 MG TABS tablet Take 1 tablet (5 mg total) by mouth 2 (two) times daily. 01/23/22   Lorretta Harp, MD  aspirin EC 81 MG tablet Take 1 tablet (81 mg total) by mouth daily. 06/01/15   Skeet Latch, MD  atorvastatin (LIPITOR) 80 MG tablet TAKE 1 TABLET BY MOUTH EVERY DAY 08/09/21   Lorretta Harp, MD  B-D UF III MINI PEN NEEDLES 31G X 5 MM MISC USE DAILY WITH VICTOZA 06/20/21   Susy Frizzle, MD  carvedilol (COREG) 12.5 MG tablet Take 3 tablets (37.5 mg total) by mouth 2 (two) times daily. 02/18/22   Susy Frizzle, MD  Continuous Blood Gluc Sensor (FREESTYLE LIBRE 2 SENSOR) MISC CHECK BLOOD SUGAR FOUR TIMES  DAILY 03/28/21   Susy Frizzle, MD  empagliflozin (JARDIANCE) 25 MG TABS tablet Take 1 tablet (25 mg total) by mouth daily before breakfast. 03/19/21   Susy Frizzle, MD  fenofibrate 160 MG tablet Take 1 tablet (160 mg total) by mouth daily. 03/22/22   Susy Frizzle, MD  glucose blood (ONETOUCH ULTRA) test strip CHECK BLOOD SUGAR TWICE A DAY 03/19/21   Susy Frizzle, MD  liraglutide (VICTOZA) 18 MG/3ML SOPN INJECT 1.2 MG UNDER THE SKIN ONCE DAILY 03/19/21   Susy Frizzle, MD  lisinopril (ZESTRIL) 20 MG tablet Take 1 tablet (20 mg total) by mouth daily. 02/20/22   Almyra Deforest, PA  loperamide (IMODIUM A-D) 2 MG tablet Take 2 mg by mouth 4 (four) times daily as needed for diarrhea or loose stools. Patient not taking: Reported on 02/27/2022    [provider]  Omega-3 Fatty Acids (FISH OIL) 1000 MG CAPS Take 1,000 mg by mouth 2 (two) times daily.    [provider]  pantoprazole (PROTONIX) 40 MG tablet Take 1 tablet (40 mg total) by mouth 2 (two) times daily. 03/22/22   Susy Frizzle, MD  pioglitazone (ACTOS) 30 MG tablet TAKE 1 TABLET BY MOUTH EVERY DAY STOP TRADJENTA 10/15/21   Susy Frizzle, MD  prochlorperazine (COMPAZINE) 10 MG tablet Take 1 tablet (10 mg total) by  mouth every 6 (six) hours as needed for nausea. Patient not taking: Reported on 02/27/2022 02/04/22   Ladell Pier, MD  triamcinolone cream (KENALOG) 0.1 % Apply 1 application. topically 2 (two) times daily. For 4 weeks 12/11/21   [provider]  vedolizumab (ENTYVIO) 300 MG injection Inject 300 mg as directed See admin instructions. Every 2 months    [provider]      Allergies    Penicillins    Review of Systems   Review of Systems  All other systems reviewed and are negative.   Physical Exam Updated Vital Signs BP (!) 160/82 (BP Location: Right Arm)   Pulse (!) 54   Temp 97.9 F (36.6 C) (Oral)   Resp 16   SpO2 96%  Physical Exam Vitals and nursing note reviewed.   Constitutional:      Appearance: He is well-developed.  HENT:     Head: Atraumatic.  Cardiovascular:     Rate and Rhythm: Normal rate.  Pulmonary:     Effort: Pulmonary effort is normal.  Musculoskeletal:     Cervical back: Neck supple.     Comments: Patient has reproducible chest wall tenderness around his right areola with palpation and also with him attempting forward flexion  Skin:    General: Skin is warm.  Neurological:     Mental Status: He is alert and oriented to person, place, and time.     ED Results / Procedures / Treatments   Labs (all labs ordered are listed, but only abnormal results are displayed) Labs Reviewed  BASIC METABOLIC PANEL - Abnormal; Notable for the following components:      Result Value   Glucose, Bld 136 (*)    Creatinine, Ser 1.69 (*)    GFR, Estimated 44 (*)    All other components within normal limits  CBC - Abnormal; Notable for the following components:   Hemoglobin 11.3 (*)    HCT 36.9 (*)    MCH 25.7 (*)    RDW 17.6 (*)    All other components within normal limits  TROPONIN I (HIGH SENSITIVITY)    EKG EKG Interpretation  Date/Time:  Wednesday March 27 2022 09:31:15 EDT Ventricular Rate:  58 PR Interval:  180 QRS Duration: 86 QT Interval:  446 QTC Calculation: 437 R Axis:   9 Text Interpretation: Sinus bradycardia Otherwise normal ECG When compared with ECG of 27-Dec-2021 07:07, No significant change was found No acute changes No old tracing to compare Confirmed by Varney Biles [phone]) on 03/27/2022 10:34:44 AM  Radiology DG Chest 2 View  Result Date: 03/27/2022 CLINICAL DATA:  Chest pain. EXAM: CHEST - 2 VIEW COMPARISON:  June 28, 2015. FINDINGS: The heart size and mediastinal contours are within normal limits. Both lungs are clear. The visualized skeletal structures are unremarkable. IMPRESSION: No active cardiopulmonary disease. Electronically Signed   By: Marijo Conception M.D.   On: 03/27/2022 09:56     Procedures Procedures    Medications Ordered in ED Medications  methocarbamol (ROBAXIN) tablet 500 mg (500 mg Oral Given 03/27/22 1046)  naproxen (NAPROSYN) tablet 375 mg (375 mg Oral Given 03/27/22 1045)    ED Course/ Medical Decision Making/ A&P                           Medical Decision Making Amount and/or Complexity of Data Reviewed Labs: ordered. Radiology: ordered.  Risk OTC drugs. Prescription drug management.   This patient presents  to the ED with chief complaint(s) of right-sided chest pain with pertinent past medical history of hepatocellular carcinoma, A-fib on Eliquis and CAD which further complicates the presenting complaint. The complaint involves an extensive differential diagnosis and also carries with it a high risk of complications and morbidity.    The differential diagnosis includes muscle strain, muscle tear, chest wall pain, bony mets.  PE considered in the differential diagnosis initially, but exam is very much consistent with musculoskeletal etiology.  Additionally patient is on Eliquis, that further reduces his risk of PE.  The initial plan is to x-ray of the chest, to ensure there is no bony metastases.   Additional history obtained: Records reviewed  patient's oncology visits and reviewed patient's radiology records, unfortunately there is no advanced imaging of the chest in the last 6 months  Independent labs interpretation:  The following labs were independently interpreted: CBC and metabolic profile were sent from the triage and they are reassuring  Independent visualization of imaging: - I independently visualized the following imaging with scope of interpretation limited to determining acute life threatening conditions related to emergency care: X-ray of the chest, which revealed no evidence of any bony metastases  Treatment and Reassessment: Results of the ER work-up discussed with the patient.  For now, plan is to treat this mild  musculoskeletal pain.  He will follow-up with his PCP if the pain continues.   Final Clinical Impression(s) / ED Diagnoses Final diagnoses:  Muscle strain of chest wall, initial encounter    Rx / DC Orders ED Discharge Orders          Ordered    methocarbamol (ROBAXIN) 500 MG tablet  2 times daily        03/27/22 1044    lidocaine 4 %  2 times daily        03/27/22 1044    acetaminophen (TYLENOL) 500 MG tablet  Every 6 hours PRN        03/27/22 1044              Varney Biles, MD 03/27/22 1103

## 2022-03-29 ENCOUNTER — Other Ambulatory Visit: Payer: Self-pay | Admitting: Family Medicine

## 2022-03-29 ENCOUNTER — Telehealth: Payer: Self-pay

## 2022-03-29 MED ORDER — HYDROCODONE-ACETAMINOPHEN 7.5-325 MG PO TABS: 1.0000 | ORAL_TABLET | Freq: Four times a day (QID) | ORAL | 0 refills | Status: DC | PRN

## 2022-03-29 NOTE — Telephone Encounter (Signed)
Spoke w/pt, advice that Rx has been sent to pharmacy.

## 2022-03-29 NOTE — Telephone Encounter (Signed)
Pt called stated that he end up pulling a muscle on his R-side of this rib on Wed. And end up in the ED.   Pt stated that the muscle relaxer is not helping with pain. Would like to know if you can send a Rx for hydrocodone for pain?  Pls advice

## 2022-04-01 ENCOUNTER — Other Ambulatory Visit (HOSPITAL_BASED_OUTPATIENT_CLINIC_OR_DEPARTMENT_OTHER): Payer: Self-pay

## 2022-04-04 ENCOUNTER — Other Ambulatory Visit: Payer: Self-pay | Admitting: Family Medicine

## 2022-04-04 ENCOUNTER — Telehealth: Payer: Self-pay

## 2022-04-04 ENCOUNTER — Other Ambulatory Visit: Payer: Self-pay

## 2022-04-04 DIAGNOSIS — I10 Essential (primary) hypertension: Secondary | ICD-10-CM

## 2022-04-04 DIAGNOSIS — E1169 Type 2 diabetes mellitus with other specified complication: Secondary | ICD-10-CM

## 2022-04-04 MED ORDER — HYDROCODONE-ACETAMINOPHEN 7.5-325 MG PO TABS: 1.0000 | ORAL_TABLET | Freq: Four times a day (QID) | ORAL | 0 refills | Status: DC | PRN

## 2022-04-04 NOTE — Telephone Encounter (Signed)
Pharmacy faxed a refill request for liraglutide (VICTOZA) 18 MG/3ML SOPN [[id number]]    Order Details Dose, Route, Frequency: As Directed  Dispense Quantity: 6 mL Refills: 9   Note to Pharmacy: PATIENT NEEDS REFILL       Sig: INJECT 1.2 MG UNDER THE SKIN ONCE DAILY       Start Date: 03/19/21 End Date: --  Written Date: 03/19/21 Expiration Date: 03/19/22

## 2022-04-04 NOTE — Telephone Encounter (Signed)
Called patient to schedule appt for medication refills/ annual visit. No answer, LVMTCB [phone]

## 2022-04-04 NOTE — Telephone Encounter (Signed)
Requested medication (s) are due for refill today: expired medication  Requested medication (s) are on the active medication list: yes  Last refill:  03/19/21 #6 ml 9 refills  Future visit scheduled: no  Notes to clinic:  expired medication . Do you want to renew Rx? Called patient to schedule appt . No answer, LVMTCB      Requested Prescriptions  Pending Prescriptions Disp Refills   liraglutide (VICTOZA) 18 MG/3ML SOPN 6 mL 9    Sig: INJECT 1.2 MG UNDER THE SKIN ONCE DAILY     Endocrinology:  Diabetes - GLP-1 Receptor Agonists Failed - 04/04/2022 12:42 PM      Failed - Valid encounter within last 6 months    Recent Outpatient Visits           1 year ago Type 2 diabetes mellitus with other specified complication, unspecified whether long term insulin use ([place])   [place] Pickard, Cammie Mcgee, MD   1 year ago Type 2 diabetes mellitus with other specified complication, unspecified whether long term insulin use ([place])   [place] Pickard, Cammie Mcgee, MD   2 years ago Benign essential HTN   [place] Susy Frizzle, MD   2 years ago Benign essential HTN   [place] Susy Frizzle, MD   3 years ago Benign essential HTN   [place], Cammie Mcgee, MD       Future Appointments             In 4 days [place], [place], [place] [facility] [place], [place]   In 2 months [place], [place], [place] [facility], [facility]            Passed - HBA1C is between 0 and 7.9 and within 180 days    Hgb A1c MFr Bld  Date Value Ref Range Status  02/08/2022 5.7 (H) <5.7 % of total Hgb Final    Comment:    For someone without known diabetes, a hemoglobin  A1c value between 5.7% and 6.4% is consistent with prediabetes and should be confirmed with a  follow-up test. . For someone with known diabetes, a value <7% indicates that their diabetes is well controlled. A1c targets should be individualized based  on duration of diabetes, age, comorbid conditions, and other considerations. . This assay result is consistent with an increased risk of diabetes. . Currently, no consensus exists regarding use of hemoglobin A1c for diagnosis of diabetes for children. Marland Kitchen

## 2022-04-04 NOTE — Telephone Encounter (Signed)
Requested medication (s) are due for refill today: no  Requested medication (s) are on the active medication list: yes  Last refill:  02/18/22  Future visit scheduled: yes  Notes to clinic:  Unable to refill per protocol, last refill by provider 02/18/22 for 90 and 3 RF     Requested Prescriptions  Pending Prescriptions Disp Refills   carvedilol (COREG) 12.5 MG tablet [Pharmacy Med Name: CARVEDILOL 12.5 MG TABLET] 180 tablet 1    Sig: Take 3 tablets (37.5 mg total) by mouth 2 (two) times daily.     Cardiovascular: Beta Blockers 3 Failed - 04/04/2022 10:43 AM      Failed - Cr in normal range and within 360 days    Creatinine  Date Value Ref Range Status  03/21/2022 1.78 (H) 0.61 - 1.24 mg/dL Final   Creat  Date Value Ref Range Status  02/08/2022 1.93 (H) 0.70 - 1.35 mg/dL Final   Creatinine, Ser  Date Value Ref Range Status  03/27/2022 1.69 (H) 0.61 - 1.24 mg/dL Final         Failed - Last BP in normal range    BP Readings from Last 1 Encounters:  03/27/22 (!) 147/74         Failed - Valid encounter within last 6 months    Recent Outpatient Visits           1 year ago Type 2 diabetes mellitus with other specified complication, unspecified whether long term insulin use ([place])   [place] Pickard, Cammie Mcgee, MD   1 year ago Type 2 diabetes mellitus with other specified complication, unspecified whether long term insulin use ([place])   [place] Pickard, Cammie Mcgee, MD   2 years ago Benign essential HTN   [place] Susy Frizzle, MD   2 years ago Benign essential HTN   [place], Warren T, MD   3 years ago Benign essential HTN   [place], Cammie Mcgee, MD       Future Appointments             In 4 days [place], [place], [place] [facility] [place], [place]   In 2 months [place], [place], [place] [facility] [place], [facility]            Passed - AST in normal range and  within 360 days    AST  Date Value Ref Range Status  03/21/2022 20 15 - 41 U/L Final         Passed - ALT in normal range and within 360 days    ALT  Date Value Ref Range Status  03/21/2022 14 0 - 44 U/L Final         Passed - Last Heart Rate in normal range    Pulse Readings from Last 1 Encounters:  03/27/22 (!) 50

## 2022-04-04 NOTE — Telephone Encounter (Signed)
Pt called in today, stating that he will be going out of town for 2 weeks, leaving on 8/30. Pt states he still have some pains left from the previous Rx.   However, pt would like to know if you would send in another Rx of Hydrocodone, incase he runs out and need to use some.   If so, send Rx to [facility] [place] [facility].  Please advice

## 2022-04-05 MED ORDER — VICTOZA 18 MG/3ML ~~LOC~~ SOPN: PEN_INJECTOR | SUBCUTANEOUS | 9 refills | Status: DC

## 2022-04-05 NOTE — Telephone Encounter (Signed)
Pls call pt for annual w/pcp for future med refills.  Thank you.

## 2022-04-08 ENCOUNTER — Ambulatory Visit: Payer: HMO | Attending: Physician Assistant | Admitting: Physician Assistant

## 2022-04-08 ENCOUNTER — Encounter: Payer: Self-pay | Admitting: Physician Assistant

## 2022-04-08 VITALS — BP 134/78 | HR 53 | Ht 70.0 in | Wt 230.8 lb

## 2022-04-08 DIAGNOSIS — E119 Type 2 diabetes mellitus without complications: Secondary | ICD-10-CM | POA: Diagnosis not present

## 2022-04-08 DIAGNOSIS — I7121 Aneurysm of the ascending aorta, without rupture: Secondary | ICD-10-CM

## 2022-04-08 DIAGNOSIS — E785 Hyperlipidemia, unspecified: Secondary | ICD-10-CM | POA: Diagnosis not present

## 2022-04-08 DIAGNOSIS — I10 Essential (primary) hypertension: Secondary | ICD-10-CM | POA: Diagnosis not present

## 2022-04-08 DIAGNOSIS — I739 Peripheral vascular disease, unspecified: Secondary | ICD-10-CM | POA: Diagnosis not present

## 2022-04-08 DIAGNOSIS — I480 Paroxysmal atrial fibrillation: Secondary | ICD-10-CM

## 2022-04-08 DIAGNOSIS — I2510 Atherosclerotic heart disease of native coronary artery without angina pectoris: Secondary | ICD-10-CM | POA: Diagnosis not present

## 2022-04-08 NOTE — Progress Notes (Unsigned)
Cardiology Office Note:    Date:  04/10/2022   ID:  Matthew Chen, DOB 1955-11-27, MRN [id number]  PCP:  Susy Frizzle, MD   [place] Providers Cardiologist:  Quay Burow, MD     Referring MD: Susy Frizzle, MD   Chief Complaint  Patient presents with   Follow-up    Seen for [name]    History of Present Illness:    Matthew Chen is a 66 y.o. male with a hx of thoracic aortic aneurysm, CKD stage III, CAD, hypertension, hyperlipidemia, DM2, PAD and paroxysmal atrial fibrillation.  He smoked for 25 years and quit more than 17 years ago.  He has never had a heart attack or stroke.  He underwent orbital rotational arthrectomy and drug eluting balloon angioplasty of distal left SFA in November 2016.  Patient underwent another orbital rotational arthrectomy and balloon angioplasty for high-grade subocclusive right popliteal plaque in December 2016.  Doppler in August 2019 showed a normal ABI bilaterally with high-frequency signal in the right popliteal artery.  He underwent another angioplasty in September 2019 revealing 95% right popliteal stenosis with three-vessel runoff, this was treated with rotational atherectomy and self-expanding stent.  Most recent Doppler in April 2020 revealed a normal ABI bilaterally with patent SFA stent.  Due to complaint of exertional chest pain and dyspnea, a 2D echocardiogram was performed last year that showed EF of 60 to 65%, mild MR, mild aortic stenosis, 46 mm ascending thoracic aortic aneurysm.  Myoview was nonischemic in June 2022.  Chest CT did show a 7.1 cm mass in the liver.  MRI of the liver showed hyperenhancing subcapsular mass of the anterior inferior right lobe of the liver, most consistent with hepatocellular carcinoma.  Patient has been referred to GI and oncology.  Subsequent liver biopsy confirmed hepatocellular carcinoma.  Cardiac catheterization in June 2022 revealed a high-grade subtotally occluded ostial calcified D1.   Patient returned a week later on 02/15/2021 with plan to intervene on the significant diagonal lesion, however procedure was complicated by coronary perforation.  Fortunately he did not develop pericardial effusion.  During the hospitalization, he had paroxysmal atrial fibrillation which was also recorded on event monitor subsequent to that admission.  He has been placed on Eliquis.  2D echo performed in August 2022 revealed normal EF, grade 2 DD, mild aortic stenosis and a mildly dilated ascending aorta.   I last saw the patient in April 2023, EKGs demonstrated he has went back into atrial fibrillation however rate controlled. By the time he presented for cardioversion on 5/18, he was back in normal sinus rhythm, therefore the procedure was canceled.  ABI obtained in May 2023 showed right ABI 0.84, left ABI 0.99 with occluded distal right SFA and popliteal artery.  He was minimally symptomatic.  He is being seen by the cancer center for hepatocellular carcinoma.  More recently, patient was evaluated by Dr. Dennard Schaumann 02/18/2022 for fatigue.  Blood work obtained on 6/30 showed creatinine 1.93 which is at his baseline.  Well-controlled cholesterol.  Hemoglobin 11.4.  Hemoglobin A1c 5.7.  Normal TSH.  Testosterone normal.  B12 low.  I last saw the patient on 02/20/2022 at which time his amlodipine was discontinued due to low blood pressure.  I also recommended discontinue lisinopril-HCTZ and replace it with lisinopril 20 mg daily by itself.  Given dyspnea, I recommend a repeat echocardiogram.  Echocardiogram obtained 04/18/2022 showed EF 65 to 70%, RVSP 21.3 mmHg, mild MR, no significant valve issue,  aortic root dilated at 41 mm.  More recently, patient presented to the emergency room and 03/27/2022 due to chest wall pain.  This was felt to be musculoskeletal in nature.  Troponin was negative.  CBC shows stable hemoglobin.  Basic metabolic panel showed improving creatinine at 1.69.  Patient presents today for follow-up.   His dyspnea on exertion has not changed much, although he says it may be a little bit better.  Chest wall pain is getting better as well.  His blood pressure is borderline high at 140/78.  Although at home, blood pressure has been in the 110s.  He will continue to monitor blood pressure.  If systolic blood pressure after 2 weeks is still 140 or higher, he may increase the lisinopril to 40 mg daily.  Overall, he is stable from the cardiac perspective.  He is concerned about his thoracic aortic aneurysm, it is currently measuring at 41 mm, we will continue to follow-up on this on a yearly basis.  He can follow-up with Dr. Alvester Chou in 5 to 6 months.   Past Medical History:  Diagnosis Date   Adenomatous colon polyp 03/2008   Anginal pain ([place])    Aortic aneurysm, thoracic ([place]) 09/09/2016   4.4 cm by echo 05/2015   Arthritis    "joints in my fingers ache" (07/13/2015)   Bicuspid aortic valve 09/09/2016   Cataract 2019   bilateral eyes   CKD (chronic kidney disease), stage III ([place])    Coronary artery disease    Diabetes mellitus without complication ([place])    Hemorrhoids    Hepatocellular carcinoma ([place])    Hyperlipidemia    Hypertension    Paroxysmal atrial fibrillation ([place])    Peripheral arterial disease ([place])    a. dopppler 05/29/2015 revealed high-grade right popliteal and distal left SFA disease b. LE angio 06/28/2015 revealing high grade calcific dx with distal L SFA and R popliteal artery s/p diamondback orbital rotational atherectomy, PTA of L SFA  c. 07/13/2015 diamondback orbital rotational atherectomy and drug eluting balloon angioplasty of R popliteal artery (P2 segment) using distal protection    Type II diabetes mellitus ([place])    Ulcerative colitis     Past Surgical History:  Procedure Laterality Date   ABDOMINAL AORTOGRAM W/LOWER EXTREMITY N/A 04/27/2018   Procedure: ABDOMINAL AORTOGRAM W/LOWER EXTREMITY;  Surgeon: Lorretta Harp, MD;  Location: [facility];  Service:  Cardiovascular;  Laterality: N/A;   COLONOSCOPY     COLONOSCOPY W/ POLYPECTOMY  X 4-5   CORONARY ATHERECTOMY N/A 02/15/2021   Procedure: CORONARY ATHERECTOMY;  Surgeon: Lorretta Harp, MD;  Location: [facility];  Service: Cardiovascular;  Laterality: N/A;  aborted    CORONARY BALLOON ANGIOPLASTY N/A 02/15/2021   Procedure: CORONARY BALLOON ANGIOPLASTY;  Surgeon: Lorretta Harp, MD;  Location: [facility];  Service: Cardiovascular;  Laterality: N/A;   COSMETIC SURGERY  < 2000   "removed birthmark from top of my head"   IR ANGIOGRAM SELECTIVE EACH ADDITIONAL VESSEL  06/08/2021   IR ANGIOGRAM SELECTIVE EACH ADDITIONAL VESSEL  06/08/2021   IR ANGIOGRAM SELECTIVE EACH ADDITIONAL VESSEL  06/08/2021   IR ANGIOGRAM SELECTIVE EACH ADDITIONAL VESSEL  06/08/2021   IR ANGIOGRAM SELECTIVE EACH ADDITIONAL VESSEL  06/21/2021   IR ANGIOGRAM SELECTIVE EACH ADDITIONAL VESSEL  06/21/2021   IR ANGIOGRAM SELECTIVE EACH ADDITIONAL VESSEL  06/21/2021   IR ANGIOGRAM SELECTIVE EACH ADDITIONAL VESSEL  06/21/2021   IR ANGIOGRAM VISCERAL SELECTIVE  06/08/2021   IR  ANGIOGRAM VISCERAL SELECTIVE  06/21/2021   IR EMBO ARTERIAL NOT HEMORR HEMANG INC GUIDE ROADMAPPING  06/08/2021   IR EMBO TUMOR ORGAN ISCHEMIA INFARCT INC GUIDE ROADMAPPING  06/21/2021   IR EMBO TUMOR ORGAN ISCHEMIA INFARCT INC GUIDE ROADMAPPING  06/21/2021   IR RADIOLOGIST EVAL & MGMT  05/08/2021   IR RADIOLOGIST EVAL & MGMT  07/26/2021   IR RADIOLOGIST EVAL & MGMT  09/05/2021   IR RADIOLOGIST EVAL & MGMT  12/27/2021   IR US GUIDE VASC ACCESS LEFT  06/21/2021   IR US GUIDE VASC ACCESS RIGHT  06/08/2021   LEFT HEART CATH AND CORONARY ANGIOGRAPHY N/A 02/08/2021   Procedure: LEFT HEART CATH AND CORONARY ANGIOGRAPHY;  Surgeon: Lorretta Harp, MD;  Location: [facility];  Service: Cardiovascular;  Laterality: N/A;   PERIPHERAL VASCULAR ATHERECTOMY  04/27/2018   Procedure: PERIPHERAL VASCULAR ATHERECTOMY;  Surgeon: Lorretta Harp, MD;  Location: [facility];  Service: Cardiovascular;;  right popliteal artery   PERIPHERAL VASCULAR CATHETERIZATION N/A 06/29/2015   Procedure: Lower Extremity Angiography;  Surgeon: Lorretta Harp, MD;  Location: [facility];  Service: Cardiovascular;  Laterality: N/A;   PERIPHERAL VASCULAR CATHETERIZATION N/A 07/13/2015   Procedure: Lower Extremity Angiography;  Surgeon: Lorretta Harp, MD;  Location: [facility];  Service: Cardiovascular;  Laterality: N/A;   PERIPHERAL VASCULAR CATHETERIZATION  07/13/2015   Procedure: Peripheral Vascular Atherectomy;  Surgeon: Lorretta Harp, MD;  Location: [facility];  Service: Cardiovascular;;   PERIPHERAL VASCULAR INTERVENTION  04/27/2018   Procedure: PERIPHERAL VASCULAR INTERVENTION;  Surgeon: Lorretta Harp, MD;  Location: [facility];  Service: Cardiovascular;;  right popliteal artery   POLYPECTOMY     TONSILLECTOMY     UPPER GASTROINTESTINAL ENDOSCOPY      Current Medications: Current Meds  Medication Sig   acetaminophen (TYLENOL) 500 MG tablet Take 1 tablet (500 mg total) by mouth every 6 (six) hours as needed.   apixaban (ELIQUIS) 5 MG TABS tablet Take 1 tablet (5 mg total) by mouth 2 (two) times daily.   aspirin EC 81 MG tablet Take 1 tablet (81 mg total) by mouth daily.   atorvastatin (LIPITOR) 80 MG tablet TAKE 1 TABLET BY MOUTH EVERY DAY   B-D UF III MINI PEN NEEDLES 31G X 5 MM MISC USE DAILY WITH VICTOZA   carvedilol (COREG) 12.5 MG tablet Take 3 tablets (37.5 mg total) by mouth 2 (two) times daily.   empagliflozin (JARDIANCE) 25 MG TABS tablet Take 1 tablet (25 mg total) by mouth daily before breakfast.   fenofibrate 160 MG tablet Take 1 tablet (160 mg total) by mouth daily.   glucose blood (ONETOUCH ULTRA) test strip CHECK BLOOD SUGAR TWICE A DAY   HYDROcodone-acetaminophen (NORCO) 7.5-325 MG tablet Take 1 tablet by mouth every 6 (six) hours as needed for moderate pain.    liraglutide (VICTOZA) 18 MG/3ML SOPN INJECT 1.2 MG UNDER THE SKIN ONCE DAILY   lisinopril (ZESTRIL) 20 MG tablet Take 1 tablet (20 mg total) by mouth daily.   Omega-3 Fatty Acids (FISH OIL) 1000 MG CAPS Take 1,000 mg by mouth 2 (two) times daily.   pantoprazole (PROTONIX) 40 MG tablet Take 1 tablet (40 mg total) by mouth 2 (two) times daily.   pioglitazone (ACTOS) 30 MG tablet TAKE 1 TABLET BY MOUTH EVERY DAY STOP TRADJENTA   vedolizumab (ENTYVIO) 300 MG injection Inject 300 mg as directed See admin instructions. 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[employer], academic  Tobacco Use   Smoking status: Former    Packs/day: 1.50    Years: 25.00    Total pack years: 37.50    Types: Cigarettes    Quit date: 08/12/1996    Years since quitting: 25.6   Smokeless tobacco: Never  Vaping Use   Vaping Use: Never used  Substance and Sexual Activity   Alcohol use: Yes    Alcohol/week: 1.0 standard drink of alcohol    Types: 1 Cans of beer per week    Comment: social use   Drug use: No   Sexual activity: Yes  Other Topics Concern   Not on file  Social History Narrative   ** Merged History Encounter **       Social Determinants of Health   Financial Resource Strain: Low Risk  (08/17/2021)   Overall Financial Resource Strain (CARDIA)    Difficulty of Paying Living Expenses: Not very hard  Food Insecurity: Not on file  Transportation Needs: Not on file  Physical Activity: Not on file  Stress: Not on file  Social Connections: Not on file     Family History: The patient's family history includes Colon cancer in his maternal grandmother; Colon polyps in his mother; Diabetes in his maternal aunt and mother; Heart disease in  his father. There is no history of Esophageal cancer, Stomach cancer, or Rectal cancer.  ROS:   Please see the history of present illness.     All other systems reviewed and are negative.  EKGs/Labs/Other Studies Reviewed:    The following studies were reviewed today:  Echo 03/18/2022  1. Left ventricular ejection fraction, by estimation, is 65 to 70%. Left  ventricular ejection fraction by 3D volume is 69 %. The left ventricle has  normal function. The left ventricle has no regional wall motion  abnormalities. Left ventricular diastolic   parameters are indeterminate. Elevated left atrial pressure. The average  left ventricular global longitudinal strain is -22.1 %. The global  longitudinal strain is normal.   2. Right ventricular systolic function is normal. The right ventricular  size is normal. There is normal pulmonary artery systolic pressure. The  estimated right ventricular systolic pressure is 62.8 mmHg.   3. The mitral valve is grossly normal. Mild mitral valve regurgitation.   4. The aortic valve is calcified. Aortic valve regurgitation is not  visualized. Aortic valve mean gradient measures 11.0 mmHg.   5. Aortic dilatation noted. There is mild dilatation of the aortic root,  measuring 41 mm.   Comparison(s): Prior images reviewed side by side. Similar gradients and  aortic sizing from prior. In prior images, called functionally bicuspid vs  trileaflet based of the PSAX imaging. Similar morphology from 2022.   EKG:  EKG is not ordered today.    Recent Labs: 03/21/2022: ALT 14; TSH 1.436 03/27/2022: BUN 23; Creatinine, Ser 1.69; Hemoglobin 11.3; Platelets 239; Potassium 4.2; Sodium 139  Recent Lipid Panel    Component Value Date/Time   CHOL 95 02/08/2022 0831   CHOL 119 10/07/2016 0843   TRIG 125 02/08/2022 0831   HDL 37 (L) 02/08/2022 0831   HDL 26 (L) 10/07/2016  0843   CHOLHDL 2.6 02/08/2022 0831   VLDL 47 (H) 11/25/2016 0838   LDLCALC 37 02/08/2022 0831      Risk Assessment/Calculations:    CHA2DS2-VASc Score = 4   This indicates a 4.8% annual risk of stroke. The patient's score is based upon: CHF History: 0 HTN History: 1 Diabetes History: 1 Stroke History: 0 Vascular Disease History: 1 Age Score: 1 Gender Score: 0          Physical Exam:    VS:  BP 134/78   Pulse (!) 53   Ht 5' 10"  (1.778 m)   Wt 230 lb 12.8 oz (104.7 kg)   SpO2 97%   BMI 33.12 kg/m        Wt Readings from Last 3 Encounters:  04/08/22 230 lb 12.8 oz (104.7 kg)  03/21/22 228 lb (103.4 kg)  02/27/22 233 lb 3.2 oz (105.8 kg)     GEN:  Well nourished, well developed in no acute distress HEENT: Normal NECK: No JVD; No carotid bruits LYMPHATICS: No lymphadenopathy CARDIAC:RRR, no murmurs, rubs, gallops RESPIRATORY:  Clear to auscultation without rales, wheezing or rhonchi  ABDOMEN: Soft, non-tender, non-distended MUSCULOSKELETAL:  No edema; No deformity  SKIN: Warm and dry NEUROLOGIC:  Alert and oriented x 3 PSYCHIATRIC:  Normal affect   ASSESSMENT:    1. Coronary artery disease involving native coronary artery of native heart without angina pectoris   2. Primary hypertension   3. Hyperlipidemia LDL goal <70   4. Controlled type 2 diabetes mellitus without complication, without long-term current use of insulin ([place])   5. PAD (peripheral artery disease) ([place])   6. Aneurysm of ascending aorta without rupture ([place])   7. PAF (paroxysmal atrial fibrillation) ([facility])    PLAN:    In order of problems listed above:  CAD: Denies any recent angina.  Continue aspirin and carvedilol  Hypertension: Blood pressure borderline elevated today, however at home, his systolic blood pressure has been in the 110s.  Hyperlipidemia: Continue Lipitor  DM2: Managed by primary care provider  PAD: No significant claudication symptoms  Thoracic aortic aneurysm: Most recent echocardiogram showed dilated thoracic aortic root measuring at 41 mm.  Plan for annual  imaging  PAF: On Eliquis and carvedilol.           Medication Adjustments/Labs and Tests Ordered: Current medicines are reviewed at length with the patient today.  Concerns regarding medicines are outlined above.  No orders of the defined types were placed in this encounter.  No orders of the defined types were placed in this encounter.   Patient Instructions  Medication Instructions:  Your physician recommends that you continue on your current medications as directed. Please refer to the Current Medication list given to you today.  *If you need a refill on your cardiac medications before your next appointment, please call your pharmacy*   Lab Work: NONE If you have labs (blood work) drawn today and your tests are completely normal, you will receive your results only by: [place] (if you have MyChart) OR A paper copy in the mail If you have any lab test that is abnormal or we need to change your treatment, we will call you to review the results.   Testing/Procedures: NONE   Follow-Up: At [facility], you and your health needs are our priority.  As part of our continuing mission to provide you with exceptional heart care, we have created designated Provider Care Teams.  These Care Teams include your primary  Cardiologist (physician) and Advanced Practice Providers (APPs -  Physician Assistants and Nurse Practitioners) who all work together to provide you with the care you need, when you need it.  We recommend signing up for the patient portal called "MyChart".  Sign up information is provided on this After Visit Summary.  MyChart is used to connect with patients for Virtual Visits (Telemedicine).  Patients are able to view lab/test results, encounter notes, upcoming appointments, etc.  Non-urgent messages can be sent to your provider as well.   To learn more about what you can do with MyChart, go to [email or web address].    Your next appointment:   5-6  month(s)  The format for your next appointment:   In Person  Provider:   Quay Burow, MD      Signed, Almyra Deforest, [place]  04/10/2022 2:04 PM    [place]

## 2022-04-08 NOTE — Patient Instructions (Signed)
Medication Instructions:  Your physician recommends that you continue on your current medications as directed. Please refer to the Current Medication list given to you today.  *If you need a refill on your cardiac medications before your next appointment, please call your pharmacy*   Lab Work: NONE If you have labs (blood work) drawn today and your tests are completely normal, you will receive your results only by: [place] (if you have MyChart) OR A paper copy in the mail If you have any lab test that is abnormal or we need to change your treatment, we will call you to review the results.   Testing/Procedures: NONE   Follow-Up: At [facility], you and your health needs are our priority.  As part of our continuing mission to provide you with exceptional heart care, we have created designated Provider Care Teams.  These Care Teams include your primary Cardiologist (physician) and Advanced Practice Providers (APPs -  Physician Assistants and Nurse Practitioners) who all work together to provide you with the care you need, when you need it.  We recommend signing up for the patient portal called "MyChart".  Sign up information is provided on this After Visit Summary.  MyChart is used to connect with patients for Virtual Visits (Telemedicine).  Patients are able to view lab/test results, encounter notes, upcoming appointments, etc.  Non-urgent messages can be sent to your provider as well.   To learn more about what you can do with MyChart, go to [email or web address].    Your next appointment:   5-6 month(s)  The format for your next appointment:   In Person  Provider:   Quay Burow, MD

## 2022-04-10 ENCOUNTER — Encounter: Payer: Self-pay | Admitting: Physician Assistant

## 2022-04-15 ENCOUNTER — Other Ambulatory Visit: Payer: Self-pay | Admitting: Oncology

## 2022-04-15 DIAGNOSIS — C220 Liver cell carcinoma: Secondary | ICD-10-CM

## 2022-04-22 ENCOUNTER — Encounter: Payer: Self-pay | Admitting: Nurse Practitioner

## 2022-04-22 ENCOUNTER — Inpatient Hospital Stay: Payer: HMO | Attending: Oncology

## 2022-04-22 ENCOUNTER — Encounter: Payer: Self-pay | Admitting: *Deleted

## 2022-04-22 ENCOUNTER — Inpatient Hospital Stay: Payer: HMO

## 2022-04-22 ENCOUNTER — Inpatient Hospital Stay (HOSPITAL_BASED_OUTPATIENT_CLINIC_OR_DEPARTMENT_OTHER): Payer: HMO | Admitting: Nurse Practitioner

## 2022-04-22 VITALS — BP 114/77 | HR 97 | Temp 98.1°F | Resp 18 | Ht 70.0 in | Wt 224.2 lb

## 2022-04-22 VITALS — BP 132/75 | HR 100

## 2022-04-22 DIAGNOSIS — Z79899 Other long term (current) drug therapy: Secondary | ICD-10-CM | POA: Diagnosis not present

## 2022-04-22 DIAGNOSIS — C220 Liver cell carcinoma: Secondary | ICD-10-CM

## 2022-04-22 DIAGNOSIS — Z5112 Encounter for antineoplastic immunotherapy: Secondary | ICD-10-CM | POA: Diagnosis not present

## 2022-04-22 LAB — CMP (CANCER CENTER ONLY)
ALT: 16 U/L (ref 0–44)
AST: 26 U/L (ref 15–41)
Albumin: 3.9 g/dL (ref 3.5–5.0)
Alkaline Phosphatase: 59 U/L (ref 38–126)
Anion gap: 11 (ref 5–15)
BUN: 23 mg/dL (ref 8–23)
CO2: 21 mmol/L — ABNORMAL LOW (ref 22–32)
Calcium: 9 mg/dL (ref 8.9–10.3)
Chloride: 108 mmol/L (ref 98–111)
Creatinine: 1.56 mg/dL — ABNORMAL HIGH (ref 0.61–1.24)
GFR, Estimated: 49 mL/min — ABNORMAL LOW (ref >60)
Glucose, Bld: 161 mg/dL — ABNORMAL HIGH (ref 70–99)
Potassium: 3.6 mmol/L (ref 3.5–5.1)
Sodium: 140 mmol/L (ref 135–145)
Total Bilirubin: 0.7 mg/dL (ref 0.3–1.2)
Total Protein: 6.9 g/dL (ref 6.5–8.1)

## 2022-04-22 LAB — CBC WITH DIFFERENTIAL (CANCER CENTER ONLY)
Abs Immature Granulocytes: 0.01 10*3/uL (ref 0.00–0.07)
Basophils Absolute: 0.1 10*3/uL (ref 0.0–0.1)
Basophils Relative: 1 %
Eosinophils Absolute: 0.2 10*3/uL (ref 0.0–0.5)
Eosinophils Relative: 4 %
HCT: 38.2 % — ABNORMAL LOW (ref 39.0–52.0)
Hemoglobin: 11.8 g/dL — ABNORMAL LOW (ref 13.0–17.0)
Immature Granulocytes: 0 %
Lymphocytes Relative: 17 %
Lymphs Abs: 1 10*3/uL (ref 0.7–4.0)
MCH: 25.2 pg — ABNORMAL LOW (ref 26.0–34.0)
MCHC: 30.9 g/dL (ref 30.0–36.0)
MCV: 81.4 fL (ref 80.0–100.0)
Monocytes Absolute: 0.8 10*3/uL (ref 0.1–1.0)
Monocytes Relative: 13 %
Neutro Abs: 3.6 10*3/uL (ref 1.7–7.7)
Neutrophils Relative %: 65 %
Platelet Count: 232 10*3/uL (ref 150–400)
RBC: 4.69 MIL/uL (ref 4.22–5.81)
RDW: 18.6 % — ABNORMAL HIGH (ref 11.5–15.5)
WBC Count: 5.7 10*3/uL (ref 4.0–10.5)
nRBC: 0 % (ref 0.0–0.2)

## 2022-04-22 LAB — TOTAL PROTEIN, URINE DIPSTICK: Protein, ur: NEGATIVE mg/dL

## 2022-04-22 MED ORDER — SODIUM CHLORIDE 0.9 % IV SOLN
15.0000 mg/kg | Freq: Once | INTRAVENOUS | Status: AC
  Administered 2022-04-22: 1600 mg via INTRAVENOUS
  Filled 2022-04-22: qty 64

## 2022-04-22 MED ORDER — SODIUM CHLORIDE 0.9 % IV SOLN
1200.0000 mg | Freq: Once | INTRAVENOUS | Status: AC
  Administered 2022-04-22: 1200 mg via INTRAVENOUS
  Filled 2022-04-22: qty 20

## 2022-04-22 MED ORDER — SODIUM CHLORIDE 0.9 % IV SOLN: Freq: Once | INTRAVENOUS | Status: AC

## 2022-04-22 NOTE — Progress Notes (Signed)
  [facility] OFFICE PROGRESS NOTE   Diagnosis: Hepatocellular carcinoma  INTERVAL HISTORY:   Matthew Chen returns as scheduled.  He completed cycle 3 atezolizumab/bevacizumab 03/21/2022.  He denies nausea/vomiting.  No mouth sores.  No change in baseline loose stools, predated treatment.  He occasionally notes a small amount of rectal bleeding.  He has mild discomfort intermittently at the lower abdomen.  He has a good appetite.  No rash.  Objective:  Vital signs in last 24 hours:  Blood pressure 114/77, pulse 97, temperature 98.1 F (36.7 C), temperature source Oral, resp. rate 18, height 5' 10"  (1.778 m), weight 224 lb 3.2 oz (101.7 kg), SpO2 98 %.    HEENT: No thrush or ulcers. Resp: Lungs clear bilaterally. Cardio: Irregular. GI: No hepatosplenomegaly. Vascular: No leg edema. Neuro: Alert and oriented. Skin: No rash.   Lab Results:  Lab Results  Component Value Date   WBC 5.7 04/22/2022   HGB 11.8 (L) 04/22/2022   HCT 38.2 (L) 04/22/2022   MCV 81.4 04/22/2022   PLT 232 04/22/2022   NEUTROABS 3.6 04/22/2022    Imaging:  No results found.  Medications: I have reviewed the patient's current medications.  Assessment/Plan: Hepatocellular carcinoma CT angio chest aorta 01/30/2021-no evidence of thoracic aortic aneurysm, diffuse hepatic steatosis, suggestion of early cirrhosis, 7.1 cm enhancing masslike area in segment 5/6, prominent gastrohepatic and periportal nodes measuring up to 7 mm MRI liver 03/22/2021-arterial hyperenhancing subcapsular mass, segment 6, 7 x 5.1 cm, LI-RADS 5, no pathologically large lymph nodes Ultrasound-guided biopsy 04/24/2021-hepatocellular carcinoma Y90 radioembolization of segment 6 and segment 7 hepatic arterial branches 06/21/2021 MRI abdomen 09/03/2021-new increased enhancement inferior and superior to the dominant right liver lesion, new 11 mm enhancing lesion in the left liver LIRADS 3, dominant right hepatic lesion stable in  size MRI abdomen 12/19/2021-unchanged hyperenhancing mass in the anterior inferior right liver measuring 7.8 x 5 cm, interval enlargement of a arterial enhancing lesion in segment 2- LIRADS 5, new small enhancing lesions hepatic segment 4A measuring 1.2 x 0.9 cm and 0.8 x 0.6 cm- LIRADS 5 Cycle 1 atezolizumab /bevacizumab 02/07/2022 Cycle 2 atezolizumab/bevacizumab 02/27/2022 Cycle 3 atezolizumab/bevacizumab 03/21/2022 Cycle 4 atezolizumab/bevacizumab 04/22/2022 Ulcerative colitis-maintained on vedolizumab Peripheral arterial disease History of a thoracic aortic aneurysm CAD Diabetes Hypertension Chronic renal failure Paroxysmal atrial fibrillation  Disposition: Matthew Chen appears stable.  He has completed 3 cycles of atezolizumab/bevacizumab.  He continues to tolerate treatment well.  Plan to proceed with cycle 4 today as scheduled.  Restaging MRI prior to next office visit.  CBC and chemistry panel reviewed.  Labs adequate to proceed as above.  Creatinine is mildly elevated, improved as compared to recent values.  Urine is negative for protein.  He will return for follow-up in 3 weeks.  He will contact the office in the interim with any problems.    Ned Card ANP/GNP-BC   04/22/2022  8:43 AM

## 2022-04-22 NOTE — Telephone Encounter (Signed)
Left message to return call to schedule annual cpe for add'l med refills.

## 2022-04-22 NOTE — Progress Notes (Signed)
Patient seen by Ned Card NP today  Vitals are within treatment parameters.  Labs reviewed by Ned Card NP and are not all in parameters with creatinine 1.56--OK to proceed  Per physician team, patient is ready for treatment and there are NO modifications to the treatment plan.

## 2022-04-22 NOTE — Patient Instructions (Signed)
Matthew Chen   Discharge Instructions: Thank you for choosing [facility] to provide your oncology and hematology care.   If you have a lab appointment with the [facility], please go directly to the [facility] and check in at the registration area.   Wear comfortable clothing and clothing appropriate for easy access to any Portacath or PICC line.   We strive to give you quality time with your provider. You may need to reschedule your appointment if you arrive late (15 or more minutes).  Arriving late affects you and other patients whose appointments are after yours.  Also, if you miss three or more appointments without notifying the office, you may be dismissed from the clinic at the provider's discretion.      For prescription refill requests, have your pharmacy contact our office and allow 72 hours for refills to be completed.    Today you received the following chemotherapy and/or immunotherapy agents [name]      To help prevent nausea and vomiting after your treatment, we encourage you to take your nausea medication as directed.  BELOW ARE SYMPTOMS THAT SHOULD BE REPORTED IMMEDIATELY: *FEVER GREATER THAN 100.4 F (38 C) OR HIGHER *CHILLS OR SWEATING *NAUSEA AND VOMITING THAT IS NOT CONTROLLED WITH YOUR NAUSEA MEDICATION *UNUSUAL SHORTNESS OF BREATH *UNUSUAL BRUISING OR BLEEDING *URINARY PROBLEMS (pain or burning when urinating, or frequent urination) *BOWEL PROBLEMS (unusual diarrhea, constipation, pain near the anus) TENDERNESS IN MOUTH AND THROAT WITH OR WITHOUT PRESENCE OF ULCERS (sore throat, sores in mouth, or a toothache) UNUSUAL RASH, SWELLING OR PAIN  UNUSUAL VAGINAL DISCHARGE OR ITCHING   Items with * indicate a potential emergency and should be followed up as soon as possible or go to the Emergency Department if any problems should occur.  Please show the CHEMOTHERAPY ALERT CARD or IMMUNOTHERAPY ALERT CARD at  check-in to the Emergency Department and triage nurse.  Should you have questions after your visit or need to cancel or reschedule your appointment, please contact [facility]  Dept: [phone]  and follow the prompts.  Office hours are 8:00 a.m. to 4:30 p.m. Monday - Friday. Please note that voicemails left after 4:00 p.m. may not be returned until the following business day.  We are closed weekends and major holidays. You have access to a nurse at all times for urgent questions. Please call the main number to the clinic Dept: [phone] and follow the prompts.   For any non-urgent questions, you may also contact your provider using MyChart. We now offer e-Visits for anyone 81 and older to request care online for non-urgent symptoms. For details visit [email or web address].   Also download the MyChart app! Go to the app store, search "MyChart", open the app, select [facility], and log in with your MyChart username and password.  Masks are optional in the cancer centers. If you would like for your care team to wear a mask while they are taking care of you, please let them know. You may have one support person who is at least 66 years old accompany you for your appointments.  Atezolizumab Injection What is this medication? ATEZOLIZUMAB (a te zoe LIZ ue mab) treats some types of cancer. It works by helping your immune system slow or stop the spread of cancer cells. It is a monoclonal antibody. This medicine may be used for other purposes; ask your health care provider or pharmacist if you have questions. COMMON BRAND  NAME(S): Tecentriq What should I tell my care team before I take this medication? They need to know if you have any of these conditions: Allogeneic stem cell transplant (uses someone else's stem cells) Autoimmune diseases, such as Crohn disease, ulcerative colitis, lupus History of chest radiation Nervous system problems, such as Guillain-Barre  syndrome, myasthenia gravis Organ transplant An unusual or allergic reaction to atezolizumab, other medications, foods, dyes, or preservatives Pregnant or trying to get pregnant Breast-feeding How should I use this medication? This medication is injected into a vein. It is given by your care team in a hospital or clinic setting. A special MedGuide will be given to you before each treatment. Be sure to read this information carefully each time. Talk to your care team about the use of this medication in children. While it may be prescribed for children as young as 2 years for selected conditions, precautions do apply. Overdosage: If you think you have taken too much of this medicine contact a poison control center or emergency room at once. NOTE: This medicine is only for you. Do not share this medicine with others. What if I miss a dose? Keep appointments for follow-up doses. It is important not to miss your dose. Call your care team if you are unable to keep an appointment. What may interact with this medication? Interactions have not been studied. This list may not describe all possible interactions. Give your health care provider a list of all the medicines, herbs, non-prescription drugs, or dietary supplements you use. Also tell them if you smoke, drink alcohol, or use illegal drugs. Some items may interact with your medicine. What should I watch for while using this medication? Your condition will be monitored carefully while you are receiving this medication. You may need blood work while taking this medication. This medication may cause serious skin reactions. They can happen weeks to months after starting the medication. Contact your care team right away if you notice fevers or flu-like symptoms with a rash. The rash may be red or purple and then turn into blisters or peeling of the skin. You may also notice a red rash with swelling of the face, lips, or lymph nodes in your neck or under  your arms. Tell your care team right away if you have any change in your eyesight. Talk to your care team if you may be pregnant. Serious birth defects can occur if you take this medication during pregnancy and for 5 months after the last dose. You will need a negative pregnancy test before starting this medication. Contraception is recommended while taking this medication and for 5 months after the last dose. Your care team can help you find the option that works for you. Do not breastfeed while taking this medication and for at least 5 months after the last dose. What side effects may I notice from receiving this medication? Side effects that you should report to your doctor or health care professional as soon as possible: Allergic reactions--skin rash, itching, hives, swelling of the face, lips, tongue, or throat Dry cough, shortness of breath or trouble breathing Eye pain, redness, irritation, or discharge with blurry or decreased vision Heart muscle inflammation--unusual weakness or fatigue, shortness of breath, chest pain, fast or irregular heartbeat, dizziness, swelling of the ankles, feet, or hands Hormone gland problems--headache, sensitivity to light, unusual weakness or fatigue, dizziness, fast or irregular heartbeat, increased sensitivity to cold or heat, excessive sweating, constipation, hair loss, increased thirst or amount of urine, tremors or  shaking, irritability Infusion reactions--chest pain, shortness of breath or trouble breathing, feeling faint or lightheaded Kidney injury (glomerulonephritis)--decrease in the amount of urine, red or dark brown urine, foamy or bubbly urine, swelling of the ankles, hands, or feet Liver injury--right upper belly pain, loss of appetite, nausea, light-colored stool, dark yellow or brown urine, yellowing skin or eyes, unusual weakness or fatigue Pain, tingling, or numbness in the hands or feet, muscle weakness, change in vision, confusion or trouble  speaking, loss of balance or coordination, trouble walking, seizures Rash, fever, and swollen lymph nodes Redness, blistering, peeling, or loosening of the skin, including inside the mouth Sudden or severe stomach pain, bloody diarrhea, fever, nausea, vomiting Side effects that usually do not require medical attention (report to your doctor or health care professional if they continue or are bothersome): Bone, joint, or muscle pain Diarrhea Fatigue Loss of appetite Nausea Skin rash This list may not describe all possible side effects. Call your doctor for medical advice about side effects. You may report side effects to FDA at 1-800-FDA-1088. Where should I keep my medication? This medication is given in a hospital or clinic. It will not be stored at home. NOTE: This sheet is a summary. It may not cover all possible information. If you have questions about this medicine, talk to your doctor, pharmacist, or health care provider.  2023 Elsevier/Gold Standard (2021-12-11 00:00:00)  Bevacizumab Injection What is this medication? BEVACIZUMAB (be va SIZ yoo mab) treats some types of cancer. It works by blocking a protein that causes cancer cells to grow and multiply. This helps to slow or stop the spread of cancer cells. It is a monoclonal antibody. This medicine may be used for other purposes; ask your health care provider or pharmacist if you have questions. COMMON BRAND NAME(S): Alymsys, Avastin, MVASI, Noah Charon What should I tell my care team before I take this medication? They need to know if you have any of these conditions: Blood clots Coughing up blood Having or recent surgery Heart failure High blood pressure History of a connection between 2 or more body parts that do not usually connect (fistula) History of a tear in your stomach or intestines Protein in your urine An unusual or allergic reaction to bevacizumab, other medications, foods, dyes, or preservatives Pregnant or trying  to get pregnant Breast-feeding How should I use this medication? This medication is injected into a vein. It is given by your care team in a hospital or clinic setting. Talk to your care team the use of this medication in children. Special care may be needed. Overdosage: If you think you have taken too much of this medicine contact a poison control center or emergency room at once. NOTE: This medicine is only for you. Do not share this medicine with others. What if I miss a dose? Keep appointments for follow-up doses. It is important not to miss your dose. Call your care team if you are unable to keep an appointment. What may interact with this medication? Interactions are not expected. This list may not describe all possible interactions. Give your health care provider a list of all the medicines, herbs, non-prescription drugs, or dietary supplements you use. Also tell them if you smoke, drink alcohol, or use illegal drugs. Some items may interact with your medicine. What should I watch for while using this medication? Your condition will be monitored carefully while you are receiving this medication. You may need blood work while taking this medication. This medication may make you  feel generally unwell. This is not uncommon as chemotherapy can affect healthy cells as well as cancer cells. Report any side effects. Continue your course of treatment even though you feel ill unless your care team tells you to stop. This medication may increase your risk to bruise or bleed. Call your care team if you notice any unusual bleeding. Before having surgery, talk to your care team to make sure it is ok. This medication can increase the risk of poor healing of your surgical site or wound. You will need to stop this medication for 28 days before surgery. After surgery, wait at least 28 days before restarting this medication. Make sure the surgical site or wound is healed enough before restarting this  medication. Talk to your care team if questions. Talk to your care team if you may be pregnant. Serious birth defects can occur if you take this medication during pregnancy and for 6 months after the last dose. Contraception is recommended while taking this medication and for 6 months after the last dose. Your care team can help you find the option that works for you. Do not breastfeed while taking this medication and for 6 months after the last dose. This medication can cause infertility. Talk to your care team if you are concerned about your fertility. What side effects may I notice from receiving this medication? Side effects that you should report to your care team as soon as possible: Allergic reactions--skin rash, itching, hives, swelling of the face, lips, tongue, or throat Bleeding--bloody or black, tar-like stools, vomiting blood or brown material that looks like coffee grounds, red or dark brown urine, small red or purple spots on skin, unusual bruising or bleeding Blood clot--pain, swelling, or warmth in the leg, shortness of breath, chest pain Heart attack--pain or tightness in the chest, shoulders, arms, or jaw, nausea, shortness of breath, cold or clammy skin, feeling faint or lightheaded Heart failure--shortness of breath, swelling of the ankles, feet, or hands, sudden weight gain, unusual weakness or fatigue Increase in blood pressure Infection--fever, chills, cough, sore throat, wounds that don't heal, pain or trouble when passing urine, general feeling of discomfort or being unwell Infusion reactions--chest pain, shortness of breath or trouble breathing, feeling faint or lightheaded Kidney injury--decrease in the amount of urine, swelling of the ankles, hands, or feet Stomach pain that is severe, does not go away, or gets worse Stroke--sudden numbness or weakness of the face, arm, or leg, trouble speaking, confusion, trouble walking, loss of balance or coordination, dizziness,  severe headache, change in vision Sudden and severe headache, confusion, change in vision, seizures, which may be signs of posterior reversible encephalopathy syndrome (PRES) Side effects that usually do not require medical attention (report to your care team if they continue or are bothersome): Back pain Change in taste Diarrhea Dry skin Increased tears Nosebleed This list may not describe all possible side effects. Call your doctor for medical advice about side effects. You may report side effects to FDA at 1-800-FDA-1088. Where should I keep my medication? This medication is given in a hospital or clinic. It will not be stored at home. NOTE: This sheet is a summary. It may not cover all possible information. If you have questions about this medicine, talk to your doctor, pharmacist, or health care provider.  2023 Elsevier/Gold Standard (2021-12-11 00:00:00)

## 2022-04-23 ENCOUNTER — Other Ambulatory Visit: Payer: Self-pay | Admitting: Family Medicine

## 2022-04-23 DIAGNOSIS — I10 Essential (primary) hypertension: Secondary | ICD-10-CM

## 2022-04-24 NOTE — Telephone Encounter (Signed)
dc'd 02/20/22 dc'd by provider Tyron Russell CMA  Requested Prescriptions  Refused Prescriptions Disp Refills  . amLODipine (NORVASC) 5 MG tablet [Pharmacy Med Name: AMLODIPINE BESYLATE 5 MG TAB] 60 tablet 2    Sig: TAKE 1 TABLET (5 MG TOTAL) BY MOUTH DAILY.     Cardiovascular: Calcium Channel Blockers 2 Failed - 04/23/2022  9:48 AM      Failed - Valid encounter within last 6 months    Recent Outpatient Visits          1 year ago Type 2 diabetes mellitus with other specified complication, unspecified whether long term insulin use ([facility])   [place] Pickard, Cammie Mcgee, MD   1 year ago Type 2 diabetes mellitus with other specified complication, unspecified whether long term insulin use ([facility])   [place] Susy Frizzle, MD   2 years ago Benign essential HTN   [place] Susy Frizzle, MD   2 years ago Benign essential HTN   [place] Susy Frizzle, MD   3 years ago Benign essential HTN   [place], Cammie Mcgee, MD      Future Appointments            In 1 month Pickard, Cammie Mcgee, MD [facility]           Passed - Last BP in normal range    BP Readings from Last 1 Encounters:  04/22/22 132/75         Passed - Last Heart Rate in normal range    Pulse Readings from Last 1 Encounters:  04/22/22 100

## 2022-05-05 ENCOUNTER — Ambulatory Visit
Admission: RE | Admit: 2022-05-05 | Discharge: 2022-05-05 | Disposition: A | Discharge status: Home / Self Care | Payer: HMO | Source: Ambulatory Visit | Attending: Nurse Practitioner | Admitting: Nurse Practitioner

## 2022-05-05 DIAGNOSIS — C220 Liver cell carcinoma: Secondary | ICD-10-CM | POA: Diagnosis not present

## 2022-05-05 DIAGNOSIS — R160 Hepatomegaly, not elsewhere classified: Secondary | ICD-10-CM | POA: Diagnosis not present

## 2022-05-05 MED ORDER — GADOBENATE DIMEGLUMINE 529 MG/ML IV SOLN
20.0000 mL | Freq: Once | INTRAVENOUS | Status: AC | PRN
  Administered 2022-05-05: 20 mL via INTRAVENOUS

## 2022-05-06 ENCOUNTER — Other Ambulatory Visit: Payer: Self-pay

## 2022-05-06 NOTE — Telephone Encounter (Signed)
Pharmacy faxed a refill request for empagliflozin (JARDIANCE) 25 MG TABS tablet [[id number]]    Order Details Dose: 25 mg Route: Oral Frequency: Daily before breakfast  Dispense Quantity: 90 tablet Refills: 3        Sig: Take 1 tablet (25 mg total) by mouth daily before breakfast.       Start Date: 03/19/21 End Date: --  Written Date: 03/19/21 Expiration Date: 03/19/22  Original Order:  empagliflozin (JARDIANCE) 25 MG TABS tablet [[id number]]

## 2022-05-07 ENCOUNTER — Telehealth: Payer: Self-pay

## 2022-05-07 MED ORDER — EMPAGLIFLOZIN 25 MG PO TABS: 25.0000 mg | ORAL_TABLET | Freq: Every day | ORAL | 0 refills | Status: DC

## 2022-05-07 NOTE — Telephone Encounter (Signed)
Requested Prescriptions  Pending Prescriptions Disp Refills  . empagliflozin (JARDIANCE) 25 MG TABS tablet 90 tablet 0    Sig: Take 1 tablet (25 mg total) by mouth daily before breakfast.     Endocrinology:  Diabetes - SGLT2 Inhibitors Failed - 05/06/2022 10:21 AM      Failed - Cr in normal range and within 360 days    Creatinine  Date Value Ref Range Status  04/22/2022 1.56 (H) 0.61 - 1.24 mg/dL Final   Creat  Date Value Ref Range Status  02/08/2022 1.93 (H) 0.70 - 1.35 mg/dL Final         Failed - eGFR in normal range and within 360 days    GFR, Est African American  Date Value Ref Range Status  11/20/2020 49 (L) > OR = 60 mL/min/1.74m Final   GFR, Est Non African American  Date Value Ref Range Status  11/20/2020 42 (L) > OR = 60 mL/min/1.780mFinal   GFR, Estimated  Date Value Ref Range Status  04/22/2022 49 (L) >60 mL/min Final    Comment:    (NOTE) Calculated using the CKD-EPI Creatinine Equation (2021)    GFR  Date Value Ref Range Status  04/17/2021 34.75 (L) >60.00 mL/min Final    Comment:    Calculated using the CKD-EPI Creatinine Equation (2021)   eGFR  Date Value Ref Range Status  02/27/2022 39 (L) >59 mL/min/1.73 Final         Failed - Valid encounter within last 6 months    Recent Outpatient Visits          1 year ago Type 2 diabetes mellitus with other specified complication, unspecified whether long term insulin use ([facility]  [place], [place]ammie Mcgee[place]   2 years ago Type 2 diabetes mellitus with other specified complication, unspecified whether long term insulin use ([facility]  [facility]y FrizzleMD   2 years ago Benign essential HTN   [place] FrizzleMD   3 years ago Benign essential HTN   [place] McgeeMD   3 years ago Benign essential HTN   [place] McgeeMD      Future Appointments             In 3 weeks Pickard, [place] [facility]           Passed - HBA1C is between 0 and 7.9 and within 180 days    Hgb A1c MFr Bld  Date Value Ref Range Status  02/08/2022 5.7 (H) <5.7 % of total Hgb Final    Comment:    For someone without known diabetes, a hemoglobin  A1c value between 5.7% and 6.4% is consistent with prediabetes and should be confirmed with a  follow-up test. . For someone with known diabetes, a value <7% indicates that their diabetes is well controlled. A1c targets should be individualized based on duration of diabetes, age, comorbid conditions, and other considerations. . This assay result is consistent with an increased risk of diabetes. . Currently, no consensus exists regarding use of hemoglobin A1c for diagnosis of diabetes for children. .Marland Kitchen

## 2022-05-07 NOTE — Telephone Encounter (Addendum)
Reci' fax from [employer], asking for verification for Jardiance . Spoke w/[employer], gave verbal refill order for pt refill on Jardiance 80m per Dr. PSamella Parrprevious Sig/order to pharmacist.

## 2022-05-08 DIAGNOSIS — I480 Paroxysmal atrial fibrillation: Secondary | ICD-10-CM | POA: Diagnosis not present

## 2022-05-08 DIAGNOSIS — Z7984 Long term (current) use of oral hypoglycemic drugs: Secondary | ICD-10-CM | POA: Diagnosis not present

## 2022-05-08 DIAGNOSIS — Z6831 Body mass index (BMI) 31.0-31.9, adult: Secondary | ICD-10-CM | POA: Diagnosis not present

## 2022-05-08 DIAGNOSIS — E1122 Type 2 diabetes mellitus with diabetic chronic kidney disease: Secondary | ICD-10-CM | POA: Diagnosis not present

## 2022-05-08 DIAGNOSIS — N1832 Chronic kidney disease, stage 3b: Secondary | ICD-10-CM | POA: Diagnosis not present

## 2022-05-08 DIAGNOSIS — D692 Other nonthrombocytopenic purpura: Secondary | ICD-10-CM | POA: Diagnosis not present

## 2022-05-08 DIAGNOSIS — C220 Liver cell carcinoma: Secondary | ICD-10-CM | POA: Diagnosis not present

## 2022-05-12 ENCOUNTER — Other Ambulatory Visit: Payer: Self-pay | Admitting: Oncology

## 2022-05-13 ENCOUNTER — Inpatient Hospital Stay (HOSPITAL_BASED_OUTPATIENT_CLINIC_OR_DEPARTMENT_OTHER): Payer: HMO | Admitting: Oncology

## 2022-05-13 ENCOUNTER — Inpatient Hospital Stay: Payer: HMO | Attending: Oncology

## 2022-05-13 ENCOUNTER — Encounter: Payer: Self-pay | Admitting: *Deleted

## 2022-05-13 ENCOUNTER — Inpatient Hospital Stay: Payer: HMO

## 2022-05-13 VITALS — BP 109/64 | HR 68 | Temp 98.1°F | Resp 18 | Ht 70.0 in | Wt 219.2 lb

## 2022-05-13 VITALS — BP 109/82 | HR 81

## 2022-05-13 DIAGNOSIS — C220 Liver cell carcinoma: Secondary | ICD-10-CM | POA: Insufficient documentation

## 2022-05-13 DIAGNOSIS — Z79899 Other long term (current) drug therapy: Secondary | ICD-10-CM | POA: Insufficient documentation

## 2022-05-13 DIAGNOSIS — Z5112 Encounter for antineoplastic immunotherapy: Secondary | ICD-10-CM | POA: Diagnosis present

## 2022-05-13 DIAGNOSIS — Z23 Encounter for immunization: Secondary | ICD-10-CM | POA: Insufficient documentation

## 2022-05-13 LAB — CMP (CANCER CENTER ONLY)
ALT: 14 U/L (ref 0–44)
AST: 21 U/L (ref 15–41)
Albumin: 3.9 g/dL (ref 3.5–5.0)
Alkaline Phosphatase: 55 U/L (ref 38–126)
Anion gap: 11 (ref 5–15)
BUN: 23 mg/dL (ref 8–23)
CO2: 23 mmol/L (ref 22–32)
Calcium: 9.1 mg/dL (ref 8.9–10.3)
Chloride: 106 mmol/L (ref 98–111)
Creatinine: 1.73 mg/dL — ABNORMAL HIGH (ref 0.61–1.24)
GFR, Estimated: 43 mL/min — ABNORMAL LOW (ref >60)
Glucose, Bld: 201 mg/dL — ABNORMAL HIGH (ref 70–99)
Potassium: 4 mmol/L (ref 3.5–5.1)
Sodium: 140 mmol/L (ref 135–145)
Total Bilirubin: 0.7 mg/dL (ref 0.3–1.2)
Total Protein: 6.8 g/dL (ref 6.5–8.1)

## 2022-05-13 LAB — CBC WITH DIFFERENTIAL (CANCER CENTER ONLY)
Abs Immature Granulocytes: 0.01 10*3/uL (ref 0.00–0.07)
Basophils Absolute: 0.1 10*3/uL (ref 0.0–0.1)
Basophils Relative: 2 %
Eosinophils Absolute: 0.4 10*3/uL (ref 0.0–0.5)
Eosinophils Relative: 8 %
HCT: 38.9 % — ABNORMAL LOW (ref 39.0–52.0)
Hemoglobin: 12 g/dL — ABNORMAL LOW (ref 13.0–17.0)
Immature Granulocytes: 0 %
Lymphocytes Relative: 18 %
Lymphs Abs: 0.8 10*3/uL (ref 0.7–4.0)
MCH: 25.2 pg — ABNORMAL LOW (ref 26.0–34.0)
MCHC: 30.8 g/dL (ref 30.0–36.0)
MCV: 81.7 fL (ref 80.0–100.0)
Monocytes Absolute: 0.7 10*3/uL (ref 0.1–1.0)
Monocytes Relative: 16 %
Neutro Abs: 2.7 10*3/uL (ref 1.7–7.7)
Neutrophils Relative %: 56 %
Platelet Count: 233 10*3/uL (ref 150–400)
RBC: 4.76 MIL/uL (ref 4.22–5.81)
RDW: 18.9 % — ABNORMAL HIGH (ref 11.5–15.5)
WBC Count: 4.7 10*3/uL (ref 4.0–10.5)
nRBC: 0 % (ref 0.0–0.2)

## 2022-05-13 LAB — TOTAL PROTEIN, URINE DIPSTICK: Protein, ur: NEGATIVE mg/dL

## 2022-05-13 MED ORDER — SODIUM CHLORIDE 0.9 % IV SOLN: Freq: Once | INTRAVENOUS | Status: AC

## 2022-05-13 MED ORDER — SODIUM CHLORIDE 0.9 % IV SOLN
15.0000 mg/kg | Freq: Once | INTRAVENOUS | Status: AC
  Administered 2022-05-13: 1600 mg via INTRAVENOUS
  Filled 2022-05-13: qty 64

## 2022-05-13 MED ORDER — SODIUM CHLORIDE 0.9 % IV SOLN
1200.0000 mg | Freq: Once | INTRAVENOUS | Status: AC
  Administered 2022-05-13: 1200 mg via INTRAVENOUS
  Filled 2022-05-13: qty 20

## 2022-05-13 NOTE — Progress Notes (Signed)
[facility] OFFICE PROGRESS NOTE   Diagnosis: Hepatocellular carcinoma  INTERVAL HISTORY:   Matthew Chen completed another cycle of atezolizumab/bevacizumab on 04/22/2022.  No diarrhea or rash.  He has chronic malaise and exertional dyspnea.  Mild nosebleeding when he blows his nose in the morning.  No other bleeding.  Good appetite.  Objective:  Vital signs in last 24 hours:  Blood pressure 109/64, pulse 68, temperature 98.1 F (36.7 C), temperature source Oral, resp. rate 18, height 5' 10"  (1.778 m), weight 219 lb 3.2 oz (99.4 kg), SpO2 100 %.    HEENT: No thrush or ulcers Resp: Lungs clear bilaterally Cardio: Regular rate and rhythm GI: No hepatosplenomegaly Vascular: No leg edema  Skin: No rash   Lab Results:  Lab Results  Component Value Date   WBC 4.7 05/13/2022   HGB 12.0 (L) 05/13/2022   HCT 38.9 (L) 05/13/2022   MCV 81.7 05/13/2022   PLT 233 05/13/2022   NEUTROABS 2.7 05/13/2022    CMP  Lab Results  Component Value Date   NA 140 04/22/2022   K 3.6 04/22/2022   CL 108 04/22/2022   CO2 21 (L) 04/22/2022   GLUCOSE 161 (H) 04/22/2022   BUN 23 04/22/2022   CREATININE 1.56 (H) 04/22/2022   CALCIUM 9.0 04/22/2022   PROT 6.9 04/22/2022   ALBUMIN 3.9 04/22/2022   AST 26 04/22/2022   ALT 16 04/22/2022   ALKPHOS 59 04/22/2022   BILITOT 0.7 04/22/2022   GFRNONAA 49 (L) 04/22/2022   GFRAA 49 (L) 11/20/2020    Lab Results  Component Value Date   CEA1 1.9 06/21/2021     Medications: I have reviewed the patient's current medications.   Assessment/Plan: Hepatocellular carcinoma CT angio chest aorta 01/30/2021-no evidence of thoracic aortic aneurysm, diffuse hepatic steatosis, suggestion of early cirrhosis, 7.1 cm enhancing masslike area in segment 5/6, prominent gastrohepatic and periportal nodes measuring up to 7 mm MRI liver 03/22/2021-arterial hyperenhancing subcapsular mass, segment 6, 7 x 5.1 cm, LI-RADS 5, no pathologically large lymph  nodes Ultrasound-guided biopsy 04/24/2021-hepatocellular carcinoma Y90 radioembolization of segment 6 and segment 7 hepatic arterial branches 06/21/2021 MRI abdomen 09/03/2021-new increased enhancement inferior and superior to the dominant right liver lesion, new 11 mm enhancing lesion in the left liver LIRADS 3, dominant right hepatic lesion stable in size MRI abdomen 12/19/2021-unchanged hyperenhancing mass in the anterior inferior right liver measuring 7.8 x 5 cm, interval enlargement of a arterial enhancing lesion in segment 2- LIRADS 5, new small enhancing lesions hepatic segment 4A measuring 1.2 x 0.9 cm and 0.8 x 0.6 cm- LIRADS 5 Cycle 1 atezolizumab /bevacizumab 02/07/2022 Cycle 2 atezolizumab/bevacizumab 02/27/2022 Cycle 3 atezolizumab/bevacizumab 03/21/2022 Cycle 4 atezolizumab/bevacizumab 04/22/2022 MRI abdomen 05/05/2022-no change in dominant segment 6 lesion, no change in hyperenhancing lesions in segment 4A and segment 2, no new lesions Cycle 5 atezolizumab/bevacizumab 05/13/2022 Ulcerative colitis-maintained on vedolizumab Peripheral arterial disease History of a thoracic aortic aneurysm CAD Diabetes Hypertension Chronic renal failure Paroxysmal atrial fibrillation    Disposition: Mr. Tilghman appears stable.  He has completed 4 treatments with atezolizumab/bevacizumab.  He has tolerated the treatment well.  There is no clinical or radiologic evidence of disease progression.  I reviewed the MRI findings and images with him.  The MRI is consistent with stable disease.  I recommend continuing atezolizumab/bevacizumab.  He agrees.  He will complete another cycle today.  Mr. Parrillo will return for an office visit and treatment in 3 weeks.  He will be referred for a  repeat MRI after 4 more cycles.  Betsy Coder, MD  05/13/2022  8:29 AM

## 2022-05-13 NOTE — Progress Notes (Signed)
Per Dr. Benay Spice, ok to proceed with treatment with Scr 1.73.

## 2022-05-13 NOTE — Progress Notes (Signed)
Patient reports his out-of-pocket per treatment is $500 and he can't afford this. Sent message to [employer] at with IV chemotherapy drug replacement team to determine if he would qualify for help.

## 2022-05-13 NOTE — Progress Notes (Signed)
Patient seen by Dr. Benay Spice today  Vitals are within treatment parameters.  Labs reviewed by Dr. Benay Spice CBC diff reviewed and within treatment parameters, CMP pending.  Per physician team, patient is ready for treatment and there are NO modifications to the treatment plan. CMP pending, wait for CMP results before proceeding.   Per MD Benay Spice, ok to treat with Creatinine 1.73 today

## 2022-05-13 NOTE — Patient Instructions (Signed)
Matthew Chen   Discharge Instructions: Thank you for choosing [facility] to provide your oncology and hematology care.   If you have a lab appointment with the [facility], please go directly to the [facility] and check in at the registration area.   Wear comfortable clothing and clothing appropriate for easy access to any Portacath or PICC line.   We strive to give you quality time with your provider. You may need to reschedule your appointment if you arrive late (15 or more minutes).  Arriving late affects you and other patients whose appointments are after yours.  Also, if you miss three or more appointments without notifying the office, you may be dismissed from the clinic at the provider's discretion.      For prescription refill requests, have your pharmacy contact our office and allow 72 hours for refills to be completed.    Today you received the following chemotherapy and/or immunotherapy agents Matthew Chen      To help prevent nausea and vomiting after your treatment, we encourage you to take your nausea medication as directed.  BELOW ARE SYMPTOMS THAT SHOULD BE REPORTED IMMEDIATELY: *FEVER GREATER THAN 100.4 F (38 C) OR HIGHER *CHILLS OR SWEATING *NAUSEA AND VOMITING THAT IS NOT CONTROLLED WITH YOUR NAUSEA MEDICATION *UNUSUAL SHORTNESS OF BREATH *UNUSUAL BRUISING OR BLEEDING *URINARY PROBLEMS (pain or burning when urinating, or frequent urination) *BOWEL PROBLEMS (unusual diarrhea, constipation, pain near the anus) TENDERNESS IN MOUTH AND THROAT WITH OR WITHOUT PRESENCE OF ULCERS (sore throat, sores in mouth, or a toothache) UNUSUAL RASH, SWELLING OR PAIN  UNUSUAL VAGINAL DISCHARGE OR ITCHING   Items with * indicate a potential emergency and should be followed up as soon as possible or go to the Emergency Department if any problems should occur.  Please show the CHEMOTHERAPY ALERT CARD or IMMUNOTHERAPY ALERT CARD at  check-in to the Emergency Department and triage nurse.  Should you have questions after your visit or need to cancel or reschedule your appointment, please contact [facility]  Dept: [phone]  and follow the prompts.  Office hours are 8:00 a.m. to 4:30 p.m. Monday - Friday. Please note that voicemails left after 4:00 p.m. may not be returned until the following business day.  We are closed weekends and major holidays. You have access to a nurse at all times for urgent questions. Please call the main number to the clinic Dept: [phone] and follow the prompts.   For any non-urgent questions, you may also contact your provider using MyChart. We now offer e-Visits for anyone 23 and older to request care online for non-urgent symptoms. For details visit [email or web address].   Also download the MyChart app! Go to the app store, search "MyChart", open the app, select [facility], and log in with your MyChart username and password.  Masks are optional in the cancer centers. If you would like for your care team to wear a mask while they are taking care of you, please let them know. You may have one support person who is at least 66 years old accompany you for your appointments.  Atezolizumab Injection What is this medication? ATEZOLIZUMAB (a te zoe LIZ ue mab) treats some types of cancer. It works by helping your immune system slow or stop the spread of cancer cells. It is a monoclonal antibody. This medicine may be used for other purposes; ask your health care provider or pharmacist if you have questions. COMMON BRAND  NAME(S): Tecentriq What should I tell my care team before I take this medication? They need to know if you have any of these conditions: Allogeneic stem cell transplant (uses someone else's stem cells) Autoimmune diseases, such as Crohn disease, ulcerative colitis, lupus History of chest radiation Nervous system problems, such as Guillain-Barre  syndrome, myasthenia gravis Organ transplant An unusual or allergic reaction to atezolizumab, other medications, foods, dyes, or preservatives Pregnant or trying to get pregnant Breast-feeding How should I use this medication? This medication is injected into a vein. It is given by your care team in a hospital or clinic setting. A special MedGuide will be given to you before each treatment. Be sure to read this information carefully each time. Talk to your care team about the use of this medication in children. While it may be prescribed for children as young as 2 years for selected conditions, precautions do apply. Overdosage: If you think you have taken too much of this medicine contact a poison control center or emergency room at once. NOTE: This medicine is only for you. Do not share this medicine with others. What if I miss a dose? Keep appointments for follow-up doses. It is important not to miss your dose. Call your care team if you are unable to keep an appointment. What may interact with this medication? Interactions have not been studied. This list may not describe all possible interactions. Give your health care provider a list of all the medicines, herbs, non-prescription drugs, or dietary supplements you use. Also tell them if you smoke, drink alcohol, or use illegal drugs. Some items may interact with your medicine. What should I watch for while using this medication? Your condition will be monitored carefully while you are receiving this medication. You may need blood work while taking this medication. This medication may cause serious skin reactions. They can happen weeks to months after starting the medication. Contact your care team right away if you notice fevers or flu-like symptoms with a rash. The rash may be red or purple and then turn into blisters or peeling of the skin. You may also notice a red rash with swelling of the face, lips, or lymph nodes in your neck or under  your arms. Tell your care team right away if you have any change in your eyesight. Talk to your care team if you may be pregnant. Serious birth defects can occur if you take this medication during pregnancy and for 5 months after the last dose. You will need a negative pregnancy test before starting this medication. Contraception is recommended while taking this medication and for 5 months after the last dose. Your care team can help you find the option that works for you. Do not breastfeed while taking this medication and for at least 5 months after the last dose. What side effects may I notice from receiving this medication? Side effects that you should report to your doctor or health care professional as soon as possible: Allergic reactions--skin rash, itching, hives, swelling of the face, lips, tongue, or throat Dry cough, shortness of breath or trouble breathing Eye pain, redness, irritation, or discharge with blurry or decreased vision Heart muscle inflammation--unusual weakness or fatigue, shortness of breath, chest pain, fast or irregular heartbeat, dizziness, swelling of the ankles, feet, or hands Hormone gland problems--headache, sensitivity to light, unusual weakness or fatigue, dizziness, fast or irregular heartbeat, increased sensitivity to cold or heat, excessive sweating, constipation, hair loss, increased thirst or amount of urine, tremors or  shaking, irritability Infusion reactions--chest pain, shortness of breath or trouble breathing, feeling faint or lightheaded Kidney injury (glomerulonephritis)--decrease in the amount of urine, red or dark brown urine, foamy or bubbly urine, swelling of the ankles, hands, or feet Liver injury--right upper belly pain, loss of appetite, nausea, light-colored stool, dark yellow or brown urine, yellowing skin or eyes, unusual weakness or fatigue Pain, tingling, or numbness in the hands or feet, muscle weakness, change in vision, confusion or trouble  speaking, loss of balance or coordination, trouble walking, seizures Rash, fever, and swollen lymph nodes Redness, blistering, peeling, or loosening of the skin, including inside the mouth Sudden or severe stomach pain, bloody diarrhea, fever, nausea, vomiting Side effects that usually do not require medical attention (report to your doctor or health care professional if they continue or are bothersome): Bone, joint, or muscle pain Diarrhea Fatigue Loss of appetite Nausea Skin rash This list may not describe all possible side effects. Call your doctor for medical advice about side effects. You may report side effects to FDA at 1-800-FDA-1088. Where should I keep my medication? This medication is given in a hospital or clinic. It will not be stored at home. NOTE: This sheet is a summary. It may not cover all possible information. If you have questions about this medicine, talk to your doctor, pharmacist, or health care provider.  2023 Elsevier/Gold Standard (2021-12-11 00:00:00)  Bevacizumab Injection What is this medication? BEVACIZUMAB (be va SIZ yoo mab) treats some types of cancer. It works by blocking a protein that causes cancer cells to grow and multiply. This helps to slow or stop the spread of cancer cells. It is a monoclonal antibody. This medicine may be used for other purposes; ask your health care provider or pharmacist if you have questions. COMMON BRAND NAME(S): Alymsys, Avastin, MVASI, Noah Charon What should I tell my care team before I take this medication? They need to know if you have any of these conditions: Blood clots Coughing up blood Having or recent surgery Heart failure High blood pressure History of a connection between 2 or more body parts that do not usually connect (fistula) History of a tear in your stomach or intestines Protein in your urine An unusual or allergic reaction to bevacizumab, other medications, foods, dyes, or preservatives Pregnant or trying  to get pregnant Breast-feeding How should I use this medication? This medication is injected into a vein. It is given by your care team in a hospital or clinic setting. Talk to your care team the use of this medication in children. Special care may be needed. Overdosage: If you think you have taken too much of this medicine contact a poison control center or emergency room at once. NOTE: This medicine is only for you. Do not share this medicine with others. What if I miss a dose? Keep appointments for follow-up doses. It is important not to miss your dose. Call your care team if you are unable to keep an appointment. What may interact with this medication? Interactions are not expected. This list may not describe all possible interactions. Give your health care provider a list of all the medicines, herbs, non-prescription drugs, or dietary supplements you use. Also tell them if you smoke, drink alcohol, or use illegal drugs. Some items may interact with your medicine. What should I watch for while using this medication? Your condition will be monitored carefully while you are receiving this medication. You may need blood work while taking this medication. This medication may make you  feel generally unwell. This is not uncommon as chemotherapy can affect healthy cells as well as cancer cells. Report any side effects. Continue your course of treatment even though you feel ill unless your care team tells you to stop. This medication may increase your risk to bruise or bleed. Call your care team if you notice any unusual bleeding. Before having surgery, talk to your care team to make sure it is ok. This medication can increase the risk of poor healing of your surgical site or wound. You will need to stop this medication for 28 days before surgery. After surgery, wait at least 28 days before restarting this medication. Make sure the surgical site or wound is healed enough before restarting this  medication. Talk to your care team if questions. Talk to your care team if you may be pregnant. Serious birth defects can occur if you take this medication during pregnancy and for 6 months after the last dose. Contraception is recommended while taking this medication and for 6 months after the last dose. Your care team can help you find the option that works for you. Do not breastfeed while taking this medication and for 6 months after the last dose. This medication can cause infertility. Talk to your care team if you are concerned about your fertility. What side effects may I notice from receiving this medication? Side effects that you should report to your care team as soon as possible: Allergic reactions--skin rash, itching, hives, swelling of the face, lips, tongue, or throat Bleeding--bloody or black, tar-like stools, vomiting blood or brown material that looks like coffee grounds, red or dark brown urine, small red or purple spots on skin, unusual bruising or bleeding Blood clot--pain, swelling, or warmth in the leg, shortness of breath, chest pain Heart attack--pain or tightness in the chest, shoulders, arms, or jaw, nausea, shortness of breath, cold or clammy skin, feeling faint or lightheaded Heart failure--shortness of breath, swelling of the ankles, feet, or hands, sudden weight gain, unusual weakness or fatigue Increase in blood pressure Infection--fever, chills, cough, sore throat, wounds that don't heal, pain or trouble when passing urine, general feeling of discomfort or being unwell Infusion reactions--chest pain, shortness of breath or trouble breathing, feeling faint or lightheaded Kidney injury--decrease in the amount of urine, swelling of the ankles, hands, or feet Stomach pain that is severe, does not go away, or gets worse Stroke--sudden numbness or weakness of the face, arm, or leg, trouble speaking, confusion, trouble walking, loss of balance or coordination, dizziness,  severe headache, change in vision Sudden and severe headache, confusion, change in vision, seizures, which may be signs of posterior reversible encephalopathy syndrome (PRES) Side effects that usually do not require medical attention (report to your care team if they continue or are bothersome): Back pain Change in taste Diarrhea Dry skin Increased tears Nosebleed This list may not describe all possible side effects. Call your doctor for medical advice about side effects. You may report side effects to FDA at 1-800-FDA-1088. Where should I keep my medication? This medication is given in a hospital or clinic. It will not be stored at home. NOTE: This sheet is a summary. It may not cover all possible information. If you have questions about this medicine, talk to your doctor, pharmacist, or health care provider.  2023 Elsevier/Gold Standard (2021-12-11 00:00:00)

## 2022-05-14 ENCOUNTER — Other Ambulatory Visit: Payer: Self-pay

## 2022-05-15 ENCOUNTER — Other Ambulatory Visit: Payer: Self-pay

## 2022-05-16 ENCOUNTER — Telehealth: Payer: Self-pay

## 2022-05-16 ENCOUNTER — Other Ambulatory Visit: Payer: Self-pay

## 2022-05-16 NOTE — Progress Notes (Signed)
The following biosimilar Mvasi (bevacizumab-awwb) has been selected for use in this patient.

## 2022-05-16 NOTE — Telephone Encounter (Signed)
Patient called and needed help with the correct number for [facility]. I e-mail her to let her know Mr. Degollado was trying to reach her.

## 2022-05-20 DIAGNOSIS — K5190 Ulcerative colitis, unspecified, without complications: Secondary | ICD-10-CM | POA: Diagnosis not present

## 2022-05-22 ENCOUNTER — Other Ambulatory Visit: Payer: Self-pay

## 2022-05-22 DIAGNOSIS — Z794 Long term (current) use of insulin: Secondary | ICD-10-CM

## 2022-05-27 ENCOUNTER — Other Ambulatory Visit: Payer: HMO

## 2022-05-27 DIAGNOSIS — N1832 Chronic kidney disease, stage 3b: Secondary | ICD-10-CM

## 2022-05-27 DIAGNOSIS — E7800 Pure hypercholesterolemia, unspecified: Secondary | ICD-10-CM

## 2022-05-27 DIAGNOSIS — I10 Essential (primary) hypertension: Secondary | ICD-10-CM

## 2022-05-27 DIAGNOSIS — R160 Hepatomegaly, not elsewhere classified: Secondary | ICD-10-CM

## 2022-05-27 DIAGNOSIS — R5383 Other fatigue: Secondary | ICD-10-CM

## 2022-05-27 DIAGNOSIS — Z794 Long term (current) use of insulin: Secondary | ICD-10-CM

## 2022-05-28 ENCOUNTER — Encounter: Payer: Self-pay | Admitting: Oncology

## 2022-05-30 ENCOUNTER — Ambulatory Visit (INDEPENDENT_AMBULATORY_CARE_PROVIDER_SITE_OTHER): Payer: HMO | Admitting: Family Medicine

## 2022-05-30 VITALS — BP 120/82 | HR 102 | Temp 97.7°F | Ht 70.0 in | Wt 218.8 lb

## 2022-05-30 DIAGNOSIS — Z125 Encounter for screening for malignant neoplasm of prostate: Secondary | ICD-10-CM

## 2022-05-30 DIAGNOSIS — Z23 Encounter for immunization: Secondary | ICD-10-CM | POA: Diagnosis not present

## 2022-05-30 DIAGNOSIS — I480 Paroxysmal atrial fibrillation: Secondary | ICD-10-CM

## 2022-05-30 DIAGNOSIS — I10 Essential (primary) hypertension: Secondary | ICD-10-CM

## 2022-05-30 DIAGNOSIS — Z794 Long term (current) use of insulin: Secondary | ICD-10-CM

## 2022-05-30 DIAGNOSIS — Z0001 Encounter for general adult medical examination with abnormal findings: Secondary | ICD-10-CM | POA: Diagnosis not present

## 2022-05-30 DIAGNOSIS — E118 Type 2 diabetes mellitus with unspecified complications: Secondary | ICD-10-CM | POA: Diagnosis not present

## 2022-05-30 DIAGNOSIS — I25118 Atherosclerotic heart disease of native coronary artery with other forms of angina pectoris: Secondary | ICD-10-CM

## 2022-05-30 DIAGNOSIS — N1832 Chronic kidney disease, stage 3b: Secondary | ICD-10-CM

## 2022-05-30 DIAGNOSIS — E7800 Pure hypercholesterolemia, unspecified: Secondary | ICD-10-CM

## 2022-05-30 DIAGNOSIS — C220 Liver cell carcinoma: Secondary | ICD-10-CM

## 2022-05-30 MED ORDER — SILDENAFIL CITRATE 100 MG PO TABS: 50.0000 mg | ORAL_TABLET | Freq: Every day | ORAL | 11 refills | Status: DC | PRN

## 2022-05-30 NOTE — Progress Notes (Signed)
Subjective:    Patient ID: Matthew Chen, male    DOB: July 29, 1956, 66 y.o.   MRN: [id number]  Patient is currently battling hepatocellular carcinoma.  Past medical history is also significant for coronary artery disease, peripheral vascular disease, diabetes mellitus type 2, stage III chronic kidney disease.  He is here today for follow-up and a complete physical exam.  Patient had a colonoscopy November 22.  There is a 1 polyp that turned out to be benign.  He did have chronic inactive colitis.  He is due for prostate cancer screening.  Patient is currently in atrial fibrillation.  He is anticoagulated with Eliquis and his heart rate is controlled with carvedilol.  He does report some mild fatigue and lack of stamina but denies any chest pain or shortness of breath.  He is due for a flu shot.  He is due for Prevnar 20.  He has had Shingrix.  He is due for COVID booster.  He is also due for RSV.  We discussed all these vaccines.  At the present time his biologic medications are keeping his hepatocellular carcinoma stable.  There has been no further progression since he started the medication.  He does request Viagra Immunization History  Administered Date(s) Administered   Influenza Inj Mdck Quad Pf 06/14/2017   Influenza, High Dose Seasonal PF 05/22/2021   Influenza,inj,Quad PF,6+ Mos 07/22/2013, 06/12/2015, 06/10/2016, 04/21/2018, 05/03/2019   Influenza-Unspecified 06/14/2017, 04/21/2018   Pneumococcal Polysaccharide-23 06/23/2017    Past Medical History:  Diagnosis Date   Adenomatous colon polyp 03/2008   Anginal pain ([place])    Aortic aneurysm, thoracic ([place]) 09/09/2016   4.4 cm by echo 05/2015   Arthritis    "joints in my fingers ache" (07/13/2015)   Bicuspid aortic valve 09/09/2016   Cataract 2019   bilateral eyes   CKD (chronic kidney disease), stage III ([place])    Coronary artery disease    Diabetes mellitus without complication ([place])    Hemorrhoids    Hepatocellular carcinoma (HCC)     Hyperlipidemia    Hypertension    Paroxysmal atrial fibrillation ([place])    Peripheral arterial disease ([place])    a. dopppler 05/29/2015 revealed high-grade right popliteal and distal left SFA disease b. LE angio 06/28/2015 revealing high grade calcific dx with distal L SFA and R popliteal artery s/p diamondback orbital rotational atherectomy, PTA of L SFA  c. 07/13/2015 diamondback orbital rotational atherectomy and drug eluting balloon angioplasty of R popliteal artery (P2 segment) using distal protection    Type II diabetes mellitus ([place])    Ulcerative colitis    Past Surgical History:  Procedure Laterality Date   ABDOMINAL AORTOGRAM W/LOWER EXTREMITY N/A 04/27/2018   Procedure: ABDOMINAL AORTOGRAM W/LOWER EXTREMITY;  Surgeon: Lorretta Harp, MD;  Location: [facility];  Service: Cardiovascular;  Laterality: N/A;   COLONOSCOPY     COLONOSCOPY W/ POLYPECTOMY  X 4-5   CORONARY ATHERECTOMY N/A 02/15/2021   Procedure: CORONARY ATHERECTOMY;  Surgeon: Lorretta Harp, MD;  Location: [facility];  Service: Cardiovascular;  Laterality: N/A;  aborted    CORONARY BALLOON ANGIOPLASTY N/A 02/15/2021   Procedure: CORONARY BALLOON ANGIOPLASTY;  Surgeon: Lorretta Harp, MD;  Location: [facility];  Service: Cardiovascular;  Laterality: N/A;   COSMETIC SURGERY  < 2000   "removed birthmark from top of my head"   IR ANGIOGRAM SELECTIVE EACH ADDITIONAL VESSEL  06/08/2021   IR [place] ADDITIONAL VESSEL  06/08/2021  IR ANGIOGRAM SELECTIVE EACH ADDITIONAL VESSEL  06/08/2021   IR ANGIOGRAM SELECTIVE EACH ADDITIONAL VESSEL  06/08/2021   IR ANGIOGRAM SELECTIVE EACH ADDITIONAL VESSEL  06/21/2021   IR ANGIOGRAM SELECTIVE EACH ADDITIONAL VESSEL  06/21/2021   IR ANGIOGRAM SELECTIVE EACH ADDITIONAL VESSEL  06/21/2021   IR ANGIOGRAM SELECTIVE EACH ADDITIONAL VESSEL  06/21/2021   IR ANGIOGRAM VISCERAL SELECTIVE  06/08/2021   IR ANGIOGRAM VISCERAL SELECTIVE  06/21/2021    IR EMBO ARTERIAL NOT HEMORR HEMANG INC GUIDE ROADMAPPING  06/08/2021   IR EMBO TUMOR ORGAN ISCHEMIA INFARCT INC GUIDE ROADMAPPING  06/21/2021   IR EMBO TUMOR ORGAN ISCHEMIA INFARCT INC GUIDE ROADMAPPING  06/21/2021   IR RADIOLOGIST EVAL & MGMT  05/08/2021   IR RADIOLOGIST EVAL & MGMT  07/26/2021   IR RADIOLOGIST EVAL & MGMT  09/05/2021   IR RADIOLOGIST EVAL & MGMT  12/27/2021   IR US GUIDE VASC ACCESS LEFT  06/21/2021   IR US GUIDE VASC ACCESS RIGHT  06/08/2021   LEFT HEART CATH AND CORONARY ANGIOGRAPHY N/A 02/08/2021   Procedure: LEFT HEART CATH AND CORONARY ANGIOGRAPHY;  Surgeon: Lorretta Harp, MD;  Location: [facility];  Service: Cardiovascular;  Laterality: N/A;   PERIPHERAL VASCULAR ATHERECTOMY  04/27/2018   Procedure: PERIPHERAL VASCULAR ATHERECTOMY;  Surgeon: Lorretta Harp, MD;  Location: [facility];  Service: Cardiovascular;;  right popliteal artery   PERIPHERAL VASCULAR CATHETERIZATION N/A 06/29/2015   Procedure: Lower Extremity Angiography;  Surgeon: Lorretta Harp, MD;  Location: [facility];  Service: Cardiovascular;  Laterality: N/A;   PERIPHERAL VASCULAR CATHETERIZATION N/A 07/13/2015   Procedure: Lower Extremity Angiography;  Surgeon: Lorretta Harp, MD;  Location: [facility];  Service: Cardiovascular;  Laterality: N/A;   PERIPHERAL VASCULAR CATHETERIZATION  07/13/2015   Procedure: Peripheral Vascular Atherectomy;  Surgeon: Lorretta Harp, MD;  Location: [facility];  Service: Cardiovascular;;   PERIPHERAL VASCULAR INTERVENTION  04/27/2018   Procedure: PERIPHERAL VASCULAR INTERVENTION;  Surgeon: Lorretta Harp, MD;  Location: [facility];  Service: Cardiovascular;;  right popliteal artery   POLYPECTOMY     TONSILLECTOMY     UPPER GASTROINTESTINAL ENDOSCOPY     Current Outpatient Medications on File Prior to Visit  Medication Sig Dispense Refill   acetaminophen (TYLENOL) 500 MG tablet Take 1 tablet (500 mg total) by  mouth every 6 (six) hours as needed. 30 tablet 0   apixaban (ELIQUIS) 5 MG TABS tablet Take 1 tablet (5 mg total) by mouth 2 (two) times daily. 180 tablet 3   aspirin EC 81 MG tablet Take 1 tablet (81 mg total) by mouth daily. 90 tablet 3   atorvastatin (LIPITOR) 80 MG tablet TAKE 1 TABLET BY MOUTH EVERY DAY 90 tablet 3   B-D UF III MINI PEN NEEDLES 31G X 5 MM MISC USE DAILY WITH VICTOZA 100 each 5   carvedilol (COREG) 12.5 MG tablet TAKE 3 TABLETS (37.5 MG TOTAL) BY MOUTH 2 (TWO) TIMES DAILY. 180 tablet 1   empagliflozin (JARDIANCE) 25 MG TABS tablet Take 1 tablet (25 mg total) by mouth daily before breakfast. 90 tablet 0   fenofibrate 160 MG tablet Take 1 tablet (160 mg total) by mouth daily. 90 tablet 3   glucose blood (ONETOUCH ULTRA) test strip CHECK BLOOD SUGAR TWICE A DAY 50 strip 15   HYDROcodone-acetaminophen (NORCO) 7.5-325 MG tablet Take 1 tablet by mouth every 6 (six) hours as needed for moderate pain. (Patient not taking:  Reported on 05/13/2022) 30 tablet 0   liraglutide (VICTOZA) 18 MG/3ML SOPN INJECT 1.2 MG UNDER THE SKIN ONCE DAILY 6 mL 9   lisinopril (ZESTRIL) 20 MG tablet Take 1 tablet (20 mg total) by mouth daily. 90 tablet 3   loperamide (IMODIUM A-D) 2 MG tablet Take 2 mg by mouth 4 (four) times daily as needed for diarrhea or loose stools. (Patient not taking: Reported on 02/27/2022)     methocarbamol (ROBAXIN) 500 MG tablet Take 1 tablet (500 mg total) by mouth 2 (two) times daily. (Patient not taking: Reported on 04/08/2022) 10 tablet 0   Omega-3 Fatty Acids (FISH OIL) 1000 MG CAPS Take 1,000 mg by mouth 2 (two) times daily.     pantoprazole (PROTONIX) 40 MG tablet Take 1 tablet (40 mg total) by mouth 2 (two) times daily. 60 tablet 11   pioglitazone (ACTOS) 30 MG tablet TAKE 1 TABLET BY MOUTH EVERY DAY STOP TRADJENTA 90 tablet 2   prochlorperazine (COMPAZINE) 10 MG tablet Take 1 tablet (10 mg total) by mouth every 6 (six) hours as needed for nausea. (Patient not taking: Reported  on 02/27/2022) 60 tablet 1   vedolizumab (ENTYVIO) 300 MG injection Inject 300 mg as directed See admin instructions. Every 2 months     No current facility-administered medications on file prior to visit.   Allergies  Allergen Reactions   Penicillins Other (See Comments)    Unsure, childhood reaction Has patient had a PCN reaction causing immediate rash, facial/tongue/throat swelling, SOB or lightheadedness with hypotension: Unknown Has patient had a PCN reaction causing severe rash involving mucus membranes or skin necrosis: Unknown Has patient had a PCN reaction that required hospitalization: No Has patient had a PCN reaction occurring within the last 10 years: No If all of the above answers are "NO", then may proceed with Cephalosporin use.    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[employer], academic  Tobacco Use   Smoking status: Former    Packs/day: 1.50    Years: 25.00    Total pack years: 37.50    Types: Cigarettes    Quit date: 08/12/1996    Years since quitting: 25.8   Smokeless tobacco: Never  Vaping Use   Vaping Use: Never used  Substance and Sexual Activity   Alcohol use: Yes    Alcohol/week: 1.0 standard drink of alcohol    Types: 1 Cans of beer per week    Comment: social use   Drug use: No   Sexual activity: Yes  Other Topics Concern   Not on file  Social History Narrative   ** Merged History Encounter **       Social Determinants of Health   Financial Resource Strain: Low Risk  (08/17/2021)   Overall Financial Resource Strain (CARDIA)    Difficulty of Paying Living Expenses: Not very hard  Food Insecurity: Not on file  Transportation Needs: Not on file  Physical Activity: Not on file  Stress: Not on file  Social Connections: Not  on file  Intimate Partner Violence: Not on file      Review of Systems  All other systems reviewed and are negative.      Objective:   Physical Exam Vitals reviewed.  Constitutional:      Appearance: He is well-developed.  Neck:     Thyroid: No thyromegaly.     Vascular: No JVD.  Cardiovascular:     Rate and Rhythm: Normal rate. Rhythm irregular.     Heart sounds: Murmur heard.  Pulmonary:     Effort: Pulmonary effort is normal. No respiratory distress.     Breath sounds: Normal breath sounds. No wheezing or rales.  Abdominal:     General: Bowel sounds are normal. There is no distension.     Palpations: Abdomen is soft.     Tenderness: There is no abdominal tenderness. There is no guarding or rebound.  Skin:    General: Skin is warm.     Findings: No rash.           Assessment & Plan:  Type 2 diabetes mellitus with complication, with long-term current use of insulin (HCC)  Benign essential HTN  Paroxysmal atrial fibrillation (HCC)  Hepatocellular carcinoma (HCC)  Pure hypercholesterolemia  Coronary artery disease of native artery of native heart with stable angina pectoris (HCC)  Stage 3b chronic kidney disease ([place])  Prostate cancer screening - Plan: PSA Patient's blood pressure today is acceptable.  Heart rate is slightly high.  Have asked the patient check his heart rate daily and if consistently greater than 100 I will increase his carvedilol.  He has chronic stable anemia.  I recommended that he try ferrous sulfate 325 mg daily and continue his B12 pill.  I will defer his hepatocellular carcinoma to his oncologist.  His LDL cholesterol is well below 70 which is the goal for his coronary artery disease and peripheral vascular disease.  I will check for prostate cancer with a PSA.  His A1c is acceptable at 6.8.  He received his flu shot today.  He received Prevnar 20 today.  I recommended a COVID booster.  The remainder of his preventative care is  up-to-date.

## 2022-05-30 NOTE — Telephone Encounter (Signed)
ENTERED IN ERROR

## 2022-05-30 NOTE — Addendum Note (Signed)
Addended by: Randal Buba K on: 05/30/2022 11:42 AM   Modules accepted: Orders

## 2022-05-31 ENCOUNTER — Other Ambulatory Visit: Payer: Self-pay

## 2022-05-31 DIAGNOSIS — E1169 Type 2 diabetes mellitus with other specified complication: Secondary | ICD-10-CM

## 2022-05-31 MED ORDER — BD PEN NEEDLE MINI U/F 31G X 5 MM MISC: 11 refills | Status: DC

## 2022-06-01 ENCOUNTER — Encounter: Payer: Self-pay | Admitting: Oncology

## 2022-06-01 ENCOUNTER — Other Ambulatory Visit: Payer: Self-pay | Admitting: Oncology

## 2022-06-01 LAB — LIPID PANEL
Cholesterol: 110 mg/dL (ref <200)
HDL: 36 mg/dL — ABNORMAL LOW (ref >=40)
LDL Cholesterol (Calc): 49 mg/dL (calc)
Non-HDL Cholesterol (Calc): 74 mg/dL (calc) (ref <130)
Total CHOL/HDL Ratio: 3.1 (calc) (ref <5.0)
Triglycerides: 175 mg/dL — ABNORMAL HIGH (ref <150)

## 2022-06-01 LAB — CBC WITH DIFFERENTIAL/PLATELET
Absolute Monocytes: 787 cells/uL (ref 200–950)
Basophils Absolute: 61 cells/uL (ref 0–200)
Basophils Relative: 1 %
Eosinophils Absolute: 220 cells/uL (ref 15–500)
Eosinophils Relative: 3.6 %
HCT: 37.4 % — ABNORMAL LOW (ref 38.5–50.0)
Hemoglobin: 11.6 g/dL — ABNORMAL LOW (ref 13.2–17.1)
Lymphs Abs: 1159 cells/uL (ref 850–3900)
MCH: 25.6 pg — ABNORMAL LOW (ref 27.0–33.0)
MCHC: 31 g/dL — ABNORMAL LOW (ref 32.0–36.0)
MCV: 82.4 fL (ref 80.0–100.0)
MPV: 12.1 fL (ref 7.5–12.5)
Monocytes Relative: 12.9 %
Neutro Abs: 3874 cells/uL (ref 1500–7800)
Neutrophils Relative %: 63.5 %
Platelets: 235 10*3/uL (ref 140–400)
RBC: 4.54 10*6/uL (ref 4.20–5.80)
RDW: 17 % — ABNORMAL HIGH (ref 11.0–15.0)
Total Lymphocyte: 19 %
WBC: 6.1 10*3/uL (ref 3.8–10.8)

## 2022-06-01 LAB — COMPREHENSIVE METABOLIC PANEL
AG Ratio: 1.3 (calc) (ref 1.0–2.5)
ALT: 12 U/L (ref 9–46)
AST: 23 U/L (ref 10–35)
Albumin: 3.9 g/dL (ref 3.6–5.1)
Alkaline phosphatase (APISO): 73 U/L (ref 35–144)
BUN/Creatinine Ratio: 16 (calc) (ref 6–22)
BUN: 29 mg/dL — ABNORMAL HIGH (ref 7–25)
CO2: 23 mmol/L (ref 20–32)
Calcium: 9.1 mg/dL (ref 8.6–10.3)
Chloride: 108 mmol/L (ref 98–110)
Creat: 1.77 mg/dL — ABNORMAL HIGH (ref 0.70–1.35)
Globulin: 2.9 g/dL (calc) (ref 1.9–3.7)
Glucose, Bld: 123 mg/dL — ABNORMAL HIGH (ref 65–99)
Potassium: 4.4 mmol/L (ref 3.5–5.3)
Sodium: 140 mmol/L (ref 135–146)
Total Bilirubin: 0.4 mg/dL (ref 0.2–1.2)
Total Protein: 6.8 g/dL (ref 6.1–8.1)

## 2022-06-01 LAB — HEMOGLOBIN A1C
Hgb A1c MFr Bld: 6.8 % of total Hgb — ABNORMAL HIGH (ref <5.7)
Mean Plasma Glucose: 148 mg/dL
eAG (mmol/L): 8.2 mmol/L

## 2022-06-01 LAB — PSA: PSA: 0.99 ng/mL (ref <=4.00)

## 2022-06-03 ENCOUNTER — Inpatient Hospital Stay: Payer: HMO

## 2022-06-03 ENCOUNTER — Inpatient Hospital Stay: Payer: HMO | Admitting: Oncology

## 2022-06-04 ENCOUNTER — Telehealth: Payer: Self-pay | Admitting: Oncology

## 2022-06-04 ENCOUNTER — Inpatient Hospital Stay: Payer: HMO

## 2022-06-04 ENCOUNTER — Inpatient Hospital Stay (HOSPITAL_BASED_OUTPATIENT_CLINIC_OR_DEPARTMENT_OTHER): Payer: HMO | Admitting: Oncology

## 2022-06-04 ENCOUNTER — Encounter: Payer: Self-pay | Admitting: *Deleted

## 2022-06-04 VITALS — BP 120/80 | HR 81

## 2022-06-04 VITALS — BP 116/71 | HR 88 | Temp 98.1°F | Resp 18 | Ht 70.0 in | Wt 218.2 lb

## 2022-06-04 DIAGNOSIS — C220 Liver cell carcinoma: Secondary | ICD-10-CM

## 2022-06-04 DIAGNOSIS — Z5112 Encounter for antineoplastic immunotherapy: Secondary | ICD-10-CM | POA: Diagnosis not present

## 2022-06-04 DIAGNOSIS — Z23 Encounter for immunization: Secondary | ICD-10-CM

## 2022-06-04 LAB — CMP (CANCER CENTER ONLY)
ALT: 15 U/L (ref 0–44)
AST: 22 U/L (ref 15–41)
Albumin: 3.9 g/dL (ref 3.5–5.0)
Alkaline Phosphatase: 57 U/L (ref 38–126)
Anion gap: 9 (ref 5–15)
BUN: 24 mg/dL — ABNORMAL HIGH (ref 8–23)
CO2: 23 mmol/L (ref 22–32)
Calcium: 9.3 mg/dL (ref 8.9–10.3)
Chloride: 107 mmol/L (ref 98–111)
Creatinine: 1.61 mg/dL — ABNORMAL HIGH (ref 0.61–1.24)
GFR, Estimated: 47 mL/min — ABNORMAL LOW (ref >60)
Glucose, Bld: 168 mg/dL — ABNORMAL HIGH (ref 70–99)
Potassium: 4.1 mmol/L (ref 3.5–5.1)
Sodium: 139 mmol/L (ref 135–145)
Total Bilirubin: 0.7 mg/dL (ref 0.3–1.2)
Total Protein: 6.7 g/dL (ref 6.5–8.1)

## 2022-06-04 LAB — TSH: TSH: 1.697 u[IU]/mL (ref 0.350–4.500)

## 2022-06-04 LAB — CBC WITH DIFFERENTIAL (CANCER CENTER ONLY)
Abs Immature Granulocytes: 0.01 10*3/uL (ref 0.00–0.07)
Basophils Absolute: 0.1 10*3/uL (ref 0.0–0.1)
Basophils Relative: 1 %
Eosinophils Absolute: 0.2 10*3/uL (ref 0.0–0.5)
Eosinophils Relative: 3 %
HCT: 37.3 % — ABNORMAL LOW (ref 39.0–52.0)
Hemoglobin: 11.3 g/dL — ABNORMAL LOW (ref 13.0–17.0)
Immature Granulocytes: 0 %
Lymphocytes Relative: 17 %
Lymphs Abs: 1 10*3/uL (ref 0.7–4.0)
MCH: 24.7 pg — ABNORMAL LOW (ref 26.0–34.0)
MCHC: 30.3 g/dL (ref 30.0–36.0)
MCV: 81.6 fL (ref 80.0–100.0)
Monocytes Absolute: 0.7 10*3/uL (ref 0.1–1.0)
Monocytes Relative: 13 %
Neutro Abs: 3.8 10*3/uL (ref 1.7–7.7)
Neutrophils Relative %: 66 %
Platelet Count: 225 10*3/uL (ref 150–400)
RBC: 4.57 MIL/uL (ref 4.22–5.81)
RDW: 19.4 % — ABNORMAL HIGH (ref 11.5–15.5)
WBC Count: 5.8 10*3/uL (ref 4.0–10.5)
nRBC: 0 % (ref 0.0–0.2)

## 2022-06-04 MED ORDER — SODIUM CHLORIDE 0.9 % IV SOLN
15.0000 mg/kg | Freq: Once | INTRAVENOUS | Status: AC
  Administered 2022-06-04: 1600 mg via INTRAVENOUS
  Filled 2022-06-04: qty 64

## 2022-06-04 MED ORDER — INFLUENZA VAC A&B SA ADJ QUAD 0.5 ML IM PRSY
0.5000 mL | PREFILLED_SYRINGE | Freq: Once | INTRAMUSCULAR | Status: AC
  Administered 2022-06-04: 0.5 mL via INTRAMUSCULAR
  Filled 2022-06-04: qty 0.5

## 2022-06-04 MED ORDER — SODIUM CHLORIDE 0.9 % IV SOLN
1200.0000 mg | Freq: Once | INTRAVENOUS | Status: AC
  Administered 2022-06-04: 1200 mg via INTRAVENOUS
  Filled 2022-06-04: qty 20

## 2022-06-04 MED ORDER — SODIUM CHLORIDE 0.9 % IV SOLN: Freq: Once | INTRAVENOUS | Status: AC

## 2022-06-04 NOTE — Patient Instructions (Signed)
[facility]   Discharge Instructions: Thank you for choosing [facility] to provide your oncology and hematology care.   If you have a lab appointment with the [facility], please go directly to the [facility] and check in at the registration area.   Wear comfortable clothing and clothing appropriate for easy access to any Portacath or PICC line.   We strive to give you quality time with your provider. You may need to reschedule your appointment if you arrive late (15 or more minutes).  Arriving late affects you and other patients whose appointments are after yours.  Also, if you miss three or more appointments without notifying the office, you may be dismissed from the clinic at the provider's discretion.      For prescription refill requests, have your pharmacy contact our office and allow 72 hours for refills to be completed.    Today you received the following chemotherapy and/or immunotherapy agents [name]      To help prevent nausea and vomiting after your treatment, we encourage you to take your nausea medication as directed.  BELOW ARE SYMPTOMS THAT SHOULD BE REPORTED IMMEDIATELY: *FEVER GREATER THAN 100.4 F (38 C) OR HIGHER *CHILLS OR SWEATING *NAUSEA AND VOMITING THAT IS NOT CONTROLLED WITH YOUR NAUSEA MEDICATION *UNUSUAL SHORTNESS OF BREATH *UNUSUAL BRUISING OR BLEEDING *URINARY PROBLEMS (pain or burning when urinating, or frequent urination) *BOWEL PROBLEMS (unusual diarrhea, constipation, pain near the anus) TENDERNESS IN MOUTH AND THROAT WITH OR WITHOUT PRESENCE OF ULCERS (sore throat, sores in mouth, or a toothache) UNUSUAL RASH, SWELLING OR PAIN  UNUSUAL VAGINAL DISCHARGE OR ITCHING   Items with * indicate a potential emergency and should be followed up as soon as possible or go to the Emergency Department if any problems should occur.  Please show the CHEMOTHERAPY ALERT CARD or IMMUNOTHERAPY ALERT CARD at  check-in to the Emergency Department and triage nurse.  Should you have questions after your visit or need to cancel or reschedule your appointment, please contact [facility]  Dept: [phone]  and follow the prompts.  Office hours are 8:00 a.m. to 4:30 p.m. Monday - Friday. Please note that voicemails left after 4:00 p.m. may not be returned until the following business day.  We are closed weekends and major holidays. You have access to a nurse at all times for urgent questions. Please call the main number to the clinic Dept: [phone] and follow the prompts.   For any non-urgent questions, you may also contact your provider using MyChart. We now offer e-Visits for anyone 31 and older to request care online for non-urgent symptoms. For details visit [email or web address].   Also download the MyChart app! Go to the app store, search "MyChart", open the app, select [facility], and log in with your MyChart username and password.  Masks are optional in the cancer centers. If you would like for your care team to wear a mask while they are taking care of you, please let them know. You may have one support person who is at least 66 years old accompany you for your appointments.  Atezolizumab Injection What is this medication? ATEZOLIZUMAB (a te zoe LIZ ue mab) treats some types of cancer. It works by helping your immune system slow or stop the spread of cancer cells. It is a monoclonal antibody. This medicine may be used for other purposes; ask your health care provider or pharmacist if you have questions. COMMON BRAND  NAME(S): Tecentriq What should I tell my care team before I take this medication? They need to know if you have any of these conditions: Allogeneic stem cell transplant (uses someone else's stem cells) Autoimmune diseases, such as Crohn disease, ulcerative colitis, lupus History of chest radiation Nervous system problems, such as Guillain-Barre  syndrome, myasthenia gravis Organ transplant An unusual or allergic reaction to atezolizumab, other medications, foods, dyes, or preservatives Pregnant or trying to get pregnant Breast-feeding How should I use this medication? This medication is injected into a vein. It is given by your care team in a hospital or clinic setting. A special MedGuide will be given to you before each treatment. Be sure to read this information carefully each time. Talk to your care team about the use of this medication in children. While it may be prescribed for children as young as 2 years for selected conditions, precautions do apply. Overdosage: If you think you have taken too much of this medicine contact a poison control center or emergency room at once. NOTE: This medicine is only for you. Do not share this medicine with others. What if I miss a dose? Keep appointments for follow-up doses. It is important not to miss your dose. Call your care team if you are unable to keep an appointment. What may interact with this medication? Interactions have not been studied. This list may not describe all possible interactions. Give your health care provider a list of all the medicines, herbs, non-prescription drugs, or dietary supplements you use. Also tell them if you smoke, drink alcohol, or use illegal drugs. Some items may interact with your medicine. What should I watch for while using this medication? Your condition will be monitored carefully while you are receiving this medication. You may need blood work while taking this medication. This medication may cause serious skin reactions. They can happen weeks to months after starting the medication. Contact your care team right away if you notice fevers or flu-like symptoms with a rash. The rash may be red or purple and then turn into blisters or peeling of the skin. You may also notice a red rash with swelling of the face, lips, or lymph nodes in your neck or under  your arms. Tell your care team right away if you have any change in your eyesight. Talk to your care team if you may be pregnant. Serious birth defects can occur if you take this medication during pregnancy and for 5 months after the last dose. You will need a negative pregnancy test before starting this medication. Contraception is recommended while taking this medication and for 5 months after the last dose. Your care team can help you find the option that works for you. Do not breastfeed while taking this medication and for at least 5 months after the last dose. What side effects may I notice from receiving this medication? Side effects that you should report to your doctor or health care professional as soon as possible: Allergic reactions--skin rash, itching, hives, swelling of the face, lips, tongue, or throat Dry cough, shortness of breath or trouble breathing Eye pain, redness, irritation, or discharge with blurry or decreased vision Heart muscle inflammation--unusual weakness or fatigue, shortness of breath, chest pain, fast or irregular heartbeat, dizziness, swelling of the ankles, feet, or hands Hormone gland problems--headache, sensitivity to light, unusual weakness or fatigue, dizziness, fast or irregular heartbeat, increased sensitivity to cold or heat, excessive sweating, constipation, hair loss, increased thirst or amount of urine, tremors or  shaking, irritability Infusion reactions--chest pain, shortness of breath or trouble breathing, feeling faint or lightheaded Kidney injury (glomerulonephritis)--decrease in the amount of urine, red or dark brown urine, foamy or bubbly urine, swelling of the ankles, hands, or feet Liver injury--right upper belly pain, loss of appetite, nausea, light-colored stool, dark yellow or brown urine, yellowing skin or eyes, unusual weakness or fatigue Pain, tingling, or numbness in the hands or feet, muscle weakness, change in vision, confusion or trouble  speaking, loss of balance or coordination, trouble walking, seizures Rash, fever, and swollen lymph nodes Redness, blistering, peeling, or loosening of the skin, including inside the mouth Sudden or severe stomach pain, bloody diarrhea, fever, nausea, vomiting Side effects that usually do not require medical attention (report to your doctor or health care professional if they continue or are bothersome): Bone, joint, or muscle pain Diarrhea Fatigue Loss of appetite Nausea Skin rash This list may not describe all possible side effects. Call your doctor for medical advice about side effects. You may report side effects to FDA at 1-800-FDA-1088. Where should I keep my medication? This medication is given in a hospital or clinic. It will not be stored at home. NOTE: This sheet is a summary. It may not cover all possible information. If you have questions about this medicine, talk to your doctor, pharmacist, or health care provider.  2023 Elsevier/Gold Standard (2021-12-11 00:00:00)  Bevacizumab Injection What is this medication? BEVACIZUMAB (be va SIZ yoo mab) treats some types of cancer. It works by blocking a protein that causes cancer cells to grow and multiply. This helps to slow or stop the spread of cancer cells. It is a monoclonal antibody. This medicine may be used for other purposes; ask your health care provider or pharmacist if you have questions. COMMON BRAND NAME(S): Alymsys, Avastin, MVASI, Noah Charon What should I tell my care team before I take this medication? They need to know if you have any of these conditions: Blood clots Coughing up blood Having or recent surgery Heart failure High blood pressure History of a connection between 2 or more body parts that do not usually connect (fistula) History of a tear in your stomach or intestines Protein in your urine An unusual or allergic reaction to bevacizumab, other medications, foods, dyes, or preservatives Pregnant or trying  to get pregnant Breast-feeding How should I use this medication? This medication is injected into a vein. It is given by your care team in a hospital or clinic setting. Talk to your care team the use of this medication in children. Special care may be needed. Overdosage: If you think you have taken too much of this medicine contact a poison control center or emergency room at once. NOTE: This medicine is only for you. Do not share this medicine with others. What if I miss a dose? Keep appointments for follow-up doses. It is important not to miss your dose. Call your care team if you are unable to keep an appointment. What may interact with this medication? Interactions are not expected. This list may not describe all possible interactions. Give your health care provider a list of all the medicines, herbs, non-prescription drugs, or dietary supplements you use. Also tell them if you smoke, drink alcohol, or use illegal drugs. Some items may interact with your medicine. What should I watch for while using this medication? Your condition will be monitored carefully while you are receiving this medication. You may need blood work while taking this medication. This medication may make you  feel generally unwell. This is not uncommon as chemotherapy can affect healthy cells as well as cancer cells. Report any side effects. Continue your course of treatment even though you feel ill unless your care team tells you to stop. This medication may increase your risk to bruise or bleed. Call your care team if you notice any unusual bleeding. Before having surgery, talk to your care team to make sure it is ok. This medication can increase the risk of poor healing of your surgical site or wound. You will need to stop this medication for 28 days before surgery. After surgery, wait at least 28 days before restarting this medication. Make sure the surgical site or wound is healed enough before restarting this  medication. Talk to your care team if questions. Talk to your care team if you may be pregnant. Serious birth defects can occur if you take this medication during pregnancy and for 6 months after the last dose. Contraception is recommended while taking this medication and for 6 months after the last dose. Your care team can help you find the option that works for you. Do not breastfeed while taking this medication and for 6 months after the last dose. This medication can cause infertility. Talk to your care team if you are concerned about your fertility. What side effects may I notice from receiving this medication? Side effects that you should report to your care team as soon as possible: Allergic reactions--skin rash, itching, hives, swelling of the face, lips, tongue, or throat Bleeding--bloody or black, tar-like stools, vomiting blood or brown material that looks like coffee grounds, red or dark brown urine, small red or purple spots on skin, unusual bruising or bleeding Blood clot--pain, swelling, or warmth in the leg, shortness of breath, chest pain Heart attack--pain or tightness in the chest, shoulders, arms, or jaw, nausea, shortness of breath, cold or clammy skin, feeling faint or lightheaded Heart failure--shortness of breath, swelling of the ankles, feet, or hands, sudden weight gain, unusual weakness or fatigue Increase in blood pressure Infection--fever, chills, cough, sore throat, wounds that don't heal, pain or trouble when passing urine, general feeling of discomfort or being unwell Infusion reactions--chest pain, shortness of breath or trouble breathing, feeling faint or lightheaded Kidney injury--decrease in the amount of urine, swelling of the ankles, hands, or feet Stomach pain that is severe, does not go away, or gets worse Stroke--sudden numbness or weakness of the face, arm, or leg, trouble speaking, confusion, trouble walking, loss of balance or coordination, dizziness,  severe headache, change in vision Sudden and severe headache, confusion, change in vision, seizures, which may be signs of posterior reversible encephalopathy syndrome (PRES) Side effects that usually do not require medical attention (report to your care team if they continue or are bothersome): Back pain Change in taste Diarrhea Dry skin Increased tears Nosebleed This list may not describe all possible side effects. Call your doctor for medical advice about side effects. You may report side effects to FDA at 1-800-FDA-1088. Where should I keep my medication? This medication is given in a hospital or clinic. It will not be stored at home. NOTE: This sheet is a summary. It may not cover all possible information. If you have questions about this medicine, talk to your doctor, pharmacist, or health care provider.  2023 Elsevier/Gold Standard (2021-12-11 00:00:00)

## 2022-06-04 NOTE — Progress Notes (Signed)
  [facility] OFFICE PROGRESS NOTE   Diagnosis: Hepatocellular carcinoma  INTERVAL HISTORY:   Matthew Chen completed another treatment with atezolizumab/bevacizumab on 05/13/2022.  No rash, bleeding, or symptom of thrombosis.  He reports intermittent diarrhea since starting Jardiance.  Chronic exertional dyspnea  Objective:  Vital signs in last 24 hours:  Blood pressure 116/71, pulse 88, temperature 98.1 F (36.7 C), temperature source Oral, resp. rate 18, height 5' 10"  (1.778 m), weight 218 lb 3.2 oz (99 kg), SpO2 100 %.    HEENT: No thrush or ulcers Resp: Distant breath sounds, no respiratory distress Cardio: Regular rate and rhythm GI: No hepatosplenomegaly, nontender Vascular: No leg edema  Skin: No rash  Lab Results:  Lab Results  Component Value Date   WBC 6.1 05/27/2022   HGB 11.6 (L) 05/27/2022   HCT 37.4 (L) 05/27/2022   MCV 82.4 05/27/2022   PLT 235 05/27/2022   NEUTROABS 3,874 05/27/2022    CMP  Lab Results  Component Value Date   NA 140 05/27/2022   K 4.4 05/27/2022   CL 108 05/27/2022   CO2 23 05/27/2022   GLUCOSE 123 (H) 05/27/2022   BUN 29 (H) 05/27/2022   CREATININE 1.77 (H) 05/27/2022   CALCIUM 9.1 05/27/2022   PROT 6.8 05/27/2022   ALBUMIN 3.9 05/13/2022   AST 23 05/27/2022   ALT 12 05/27/2022   ALKPHOS 55 05/13/2022   BILITOT 0.4 05/27/2022   GFRNONAA 43 (L) 05/13/2022   GFRAA 49 (L) 11/20/2020    Lab Results  Component Value Date   CEA1 1.9 06/21/2021    Medications: I have reviewed the patient's current medications.   Assessment/Plan: Hepatocellular carcinoma CT angio chest aorta 01/30/2021-no evidence of thoracic aortic aneurysm, diffuse hepatic steatosis, suggestion of early cirrhosis, 7.1 cm enhancing masslike area in segment 5/6, prominent gastrohepatic and periportal nodes measuring up to 7 mm MRI liver 03/22/2021-arterial hyperenhancing subcapsular mass, segment 6, 7 x 5.1 cm, LI-RADS 5, no pathologically large  lymph nodes Ultrasound-guided biopsy 04/24/2021-hepatocellular carcinoma Y90 radioembolization of segment 6 and segment 7 hepatic arterial branches 06/21/2021 MRI abdomen 09/03/2021-new increased enhancement inferior and superior to the dominant right liver lesion, new 11 mm enhancing lesion in the left liver LIRADS 3, dominant right hepatic lesion stable in size MRI abdomen 12/19/2021-unchanged hyperenhancing mass in the anterior inferior right liver measuring 7.8 x 5 cm, interval enlargement of a arterial enhancing lesion in segment 2- LIRADS 5, new small enhancing lesions hepatic segment 4A measuring 1.2 x 0.9 cm and 0.8 x 0.6 cm- LIRADS 5 Cycle 1 atezolizumab /bevacizumab 02/07/2022 Cycle 2 atezolizumab/bevacizumab 02/27/2022 Cycle 3 atezolizumab/bevacizumab 03/21/2022 Cycle 4 atezolizumab/bevacizumab 04/22/2022 MRI abdomen 05/05/2022-no change in dominant segment 6 lesion, no change in hyperenhancing lesions in segment 4A and segment 2, no new lesions Cycle 5 atezolizumab/bevacizumab 05/13/2022 Cycle 6 atezolizumab/bevacizumab 06/04/2022 Ulcerative colitis-maintained on vedolizumab Peripheral arterial disease History of a thoracic aortic aneurysm CAD Diabetes Hypertension Chronic renal failure Paroxysmal atrial fibrillation     Disposition: Matthew Chen appears stable.  He will complete another cycle of atezolizumab/bevacizumab today.  He is tolerating the treatment well.  He will return for an office visit and atezolizumab/bevacizumab in 3 weeks.  He will receive an influenza vaccine today.  Betsy Coder, MD  06/04/2022  8:22 AM

## 2022-06-04 NOTE — Progress Notes (Signed)
Patient seen by Dr. Benay Spice today  Vitals are within treatment parameters.  Labs reviewed by Dr. Benay Spice and are not all within treatment parameters. Creatinine 1.61 -ok to treat No need for urine protein today  Per physician team, patient is ready for treatment and there are NO modifications to the treatment plan.

## 2022-06-04 NOTE — Telephone Encounter (Signed)
Pt has been approved for a one-time grant $1000 from special funds through the [facility]: 06/04/2022  This money is to help you with medication or other critical bills during your treatment and until funds exhaust.

## 2022-06-05 ENCOUNTER — Other Ambulatory Visit: Payer: Self-pay

## 2022-06-05 LAB — T4: T4, Total: 5.8 ug/dL (ref 4.5–12.0)

## 2022-06-10 ENCOUNTER — Ambulatory Visit: Payer: HMO | Admitting: Physician Assistant

## 2022-06-11 ENCOUNTER — Other Ambulatory Visit: Payer: Self-pay

## 2022-06-12 ENCOUNTER — Telehealth: Payer: Self-pay | Admitting: Pharmacist

## 2022-06-12 NOTE — Progress Notes (Signed)
Chronic Care Management Pharmacy Assistant   Name: DARIEN KADING  MRN: [id number] DOB: 1956/03/16   Reason for Encounter: Diabetes Adherence Call    Recent office visits:  05/30/2022 OV (PCP) Susy Frizzle, MD;  Have asked the patient check his heart rate daily and if consistently greater than 100 I will increase his carvedilol.  He has chronic stable anemia.  I recommended that he try ferrous sulfate 325 mg daily and continue his B12 pill.  02/18/2022 OV (PCP) Susy Frizzle, MD;  I like him to stop the glipizide and reassess in 7 to 10 days and see if he feels better.  If not, we could potentially try stopping Victoza to see if stopping the diarrhea would help him feel better.  I will also check a testosterone level along with a B12.  If none of these issues seem to help with fatigue  Recent consult visits:  06/04/2022 OV (Oncology) [name], MD;  He will complete another cycle of atezolizumab/bevacizumab today.  He is tolerating the treatment well.  05/13/2022 OV (Oncology) [name], MD;  I recommend continuing atezolizumab/bevacizumab.  He agrees.  He will complete another cycle today.  04/22/2022 OV (Oncology) Owens Shark, NP;  He has completed 3 cycles of atezolizumab/bevacizumab.  He continues to tolerate treatment well.  Plan to proceed with cycle 4 today as scheduled.  04/08/2022 OV (Cardiology) Almyra Deforest, [place]; no medication changes indicated.  03/21/2022 OV (Oncology) [name], MD;  He is tolerating the atezolizumab/bevacizumab well.  He will complete cycle 3 today.  02/27/2022 OV (Oncology) [name], MD;  Mr. Gueye tolerated the first treatment with atezolizumab/bevacizumab well.  He will complete cycle 2 today.  02/20/2022 OV (Cardiology) Almyra Deforest, [place];  Blood pressure borderline low, will discontinue lisinopril-HCTZ and continue on lisinopril 20 mg daily by itself.  02/01/2022 OV (Oncology) Owens Shark, NP;  Dr. Benay Spice recommends  atezolizumab/bevacizumab.  We again reviewed potential side effects.  He agrees to proceed.  01/03/2022 OV (Cardiology) Lorretta Harp, MD; no medication changes indicated.  Hospital visits:  03/27/2022 ED visit for Muscle strain of chest -Rx Methocarbamol 500 mg two times daily -Rx Acetaminophen 500 mg every 6 hours PRN  12/27/2021 Admission for Surgery due to Persistent atrial fibrillation *Cancelled Procedure  Medications: Outpatient Encounter Medications as of 06/12/2022  Medication Sig   acetaminophen (TYLENOL) 500 MG tablet Take 1 tablet (500 mg total) by mouth every 6 (six) hours as needed.   apixaban (ELIQUIS) 5 MG TABS tablet Take 1 tablet (5 mg total) by mouth 2 (two) times daily.   aspirin EC 81 MG tablet Take 1 tablet (81 mg total) by mouth daily.   atorvastatin (LIPITOR) 80 MG tablet TAKE 1 TABLET BY MOUTH EVERY DAY   carvedilol (COREG) 12.5 MG tablet TAKE 3 TABLETS (37.5 MG TOTAL) BY MOUTH 2 (TWO) TIMES DAILY.   empagliflozin (JARDIANCE) 25 MG TABS tablet Take 1 tablet (25 mg total) by mouth daily before breakfast.   fenofibrate 160 MG tablet Take 1 tablet (160 mg total) by mouth daily.   glucose blood (ONETOUCH ULTRA) test strip CHECK BLOOD SUGAR TWICE A DAY   HYDROcodone-acetaminophen (NORCO) 7.5-325 MG tablet Take 1 tablet by mouth every 6 (six) hours as needed for moderate pain. (Patient not taking: Reported on 06/04/2022)   Insulin Pen Needle (B-D UF III MINI PEN NEEDLES) 31G X 5 MM MISC Use daily with Victoza   liraglutide (VICTOZA) 18 MG/3ML  SOPN INJECT 1.2 MG UNDER THE SKIN ONCE DAILY   lisinopril (ZESTRIL) 20 MG tablet Take 1 tablet (20 mg total) by mouth daily.   loperamide (IMODIUM A-D) 2 MG tablet Take 2 mg by mouth 4 (four) times daily as needed for diarrhea or loose stools. (Patient not taking: Reported on 06/04/2022)   methocarbamol (ROBAXIN) 500 MG tablet Take 1 tablet (500 mg total) by mouth 2 (two) times daily. (Patient not taking: Reported on 05/30/2022)    Omega-3 Fatty Acids (FISH OIL) 1000 MG CAPS Take 1,000 mg by mouth 2 (two) times daily.   pantoprazole (PROTONIX) 40 MG tablet Take 1 tablet (40 mg total) by mouth 2 (two) times daily.   pioglitazone (ACTOS) 30 MG tablet TAKE 1 TABLET BY MOUTH EVERY DAY STOP TRADJENTA   prochlorperazine (COMPAZINE) 10 MG tablet Take 1 tablet (10 mg total) by mouth every 6 (six) hours as needed for nausea. (Patient not taking: Reported on 05/30/2022)   sildenafil (VIAGRA) 100 MG tablet Take 0.5-1 tablets (50-100 mg total) by mouth daily as needed for erectile dysfunction.   vedolizumab (ENTYVIO) 300 MG injection Inject 300 mg as directed See admin instructions. Every 2 months   No facility-administered encounter medications on file as of 06/12/2022.   Recent Relevant Labs: Lab Results  Component Value Date/Time   HGBA1C 6.8 (H) 05/27/2022 08:35 AM   HGBA1C 5.7 (H) 02/08/2022 08:31 AM   MICROALBUR 0.2 11/20/2020 08:30 AM   MICROALBUR 0.2 04/25/2020 12:05 PM    Kidney Function Lab Results  Component Value Date/Time   CREATININE 1.61 (H) 06/04/2022 08:09 AM   CREATININE 1.77 (H) 05/27/2022 08:35 AM   CREATININE 1.73 (H) 05/13/2022 08:15 AM   CREATININE 1.93 (H) 02/08/2022 08:31 AM   GFR 34.75 (L) 04/17/2021 02:01 PM   GFRNONAA 47 (L) 06/04/2022 08:09 AM   GFRNONAA 42 (L) 11/20/2020 08:30 AM   GFRAA 49 (L) 11/20/2020 08:30 AM    Current antihyperglycemic regimen:  Jardiance 25 mg daily Victoza 1.2 mg once a day Pioglitazone 30 mg  What recent interventions/DTPs have been made to improve glycemic control:  Glipizide discontinued on 02/18/2022  Have there been any recent hospitalizations or ED visits since last visit with CPP? Yes  Patient denies hypoglycemic symptoms.  Patient denies hyperglycemic symptoms.  How often are you checking your blood sugar? A few times a week.  What are your blood sugars ranging?  Fasting: 103  During the week, how often does your blood glucose drop below 70?  Never  Are you checking your feet daily/regularly? Yes  Adherence Review: Is the patient currently on a STATIN medication? Yes Is the patient currently on ACE/ARB medication? Yes Does the patient have >5 day gap between last estimated fill dates? No   Care Gaps: Medicare Annual Wellness: Completed 05/30/2022 Ophthalmology Exam: Next due on 12/27/2022 Foot Exam: Next due on 05/31/2023 GFR: 47 on 06/04/2022 Urine ACR: Overdue Hemoglobin A1C: 6.8% on 05/27/2022 Colonoscopy: Completed 07/11/2021  Future Appointments  Date Time Provider [place]  06/24/2022  8:15 AM [facility] PHLEBOTOMIST [facility] None  06/24/2022  8:40 AM Ladell Pier, MD [facility] None  06/24/2022  9:45 AM [facility] CHAIR 3 [facility] None  07/15/2022  8:15 AM [facility] PHLEBOTOMIST [facility] None  07/15/2022  8:40 AM Ladell Pier, MD [facility] None  07/15/2022  9:45 AM [facility] CHAIR 2 [facility] None  09/10/2022  8:00 AM Lorretta Harp, MD [facility] None   Star Rating Drugs: Atorvastatin 80 mg last filled 05/07/2022 90  DS Jardiance 25 mg last filled 05/07/2022 90 DS Lisinopril 20 mg last filled 05/19/2022 90 DS Pioglitazone 30 mg last fille 05/27/2022 90 DS  April D Calhoun, [place] Pharmacist Assistant [phone]

## 2022-06-17 ENCOUNTER — Other Ambulatory Visit: Payer: Self-pay | Admitting: Family Medicine

## 2022-06-17 DIAGNOSIS — I10 Essential (primary) hypertension: Secondary | ICD-10-CM

## 2022-06-17 NOTE — Telephone Encounter (Signed)
Requested Prescriptions  Pending Prescriptions Disp Refills   carvedilol (COREG) 12.5 MG tablet [Pharmacy Med Name: CARVEDILOL 12.5 MG TABLET] 180 tablet 2    Sig: TAKE 3 TABLETS (37.5 MG) BY MOUTH 2 TIMES DAILY     Cardiovascular: Beta Blockers 3 Failed - 06/17/2022  2:03 AM      Failed - Cr in normal range and within 360 days    Creatinine  Date Value Ref Range Status  06/04/2022 1.61 (H) 0.61 - 1.24 mg/dL Final   Creat  Date Value Ref Range Status  05/27/2022 1.77 (H) 0.70 - 1.35 mg/dL Final         Failed - Valid encounter within last 6 months    Recent Outpatient Visits           1 year ago Type 2 diabetes mellitus with other specified complication, unspecified whether long term insulin use ([place])   [place], Cammie Mcgee, MD   2 years ago Type 2 diabetes mellitus with other specified complication, unspecified whether long term insulin use ([place])   [place] Susy Frizzle, MD   2 years ago Benign essential HTN   [place] Susy Frizzle, MD   3 years ago Benign essential HTN   [place] Susy Frizzle, MD   3 years ago Benign essential HTN   [place], Cammie Mcgee, MD       Future Appointments             In 2 months Gwenlyn Found, Pearletha Forge, MD [facility]            Passed - AST in normal range and within 360 days    AST  Date Value Ref Range Status  06/04/2022 22 15 - 41 U/L Final         Passed - ALT in normal range and within 360 days    ALT  Date Value Ref Range Status  06/04/2022 15 0 - 44 U/L Final         Passed - Last BP in normal range    BP Readings from Last 1 Encounters:  06/04/22 120/80         Passed - Last Heart Rate in normal range    Pulse Readings from Last 1 Encounters:  06/04/22 81

## 2022-06-22 IMAGING — NM NM RADIO PHARM THERAPY INTRA ARTERIAL
2 series · 12 of 12 positions shown · non-contrast
Comparison: MAA scan 06/08/2021, MRI abdomen 03/22/2021

CLINICAL DATA: 65-year-old male with hepatocellular carcinoma.
Initial radio embolization to the RIGHT hepatic lobe.

EXAM:
NUCLEAR MEDICINE SPECIAL MED RAD PHYSICS CONS; NUCLEAR MEDICINE
RADIO PHARM THERAPY INTRA ARTERIAL; NUCLEAR MEDICINE TREATMENT
PROCEDURE; NUCLEAR MEDICINE LIVER SCAN
TECHNIQUE: In conjunction with the interventional radiologist a Y- Microsphere
dose was calculated utilizing body surface area formulation.
Radiosegmentectomy dosing to the RIGHT hepatic lobe (segment 6) with
calculated dose equal 36 mCi. Dose to be split into two aliquots of
18 millicuries each. Pre therapy MAA liver SPECT scan and CTA were
evaluated. Utilizing a microcatheter system, the hepatic artery was
selected and Y-90 microspheres were delivered in fractionated
aliquots. Radiopharmaceutical was delivered by the interventional
radiologist and nuclear radiologist.

[Series 1: spect - (id) _(id)_tra · 4.1mm · 4.14mm/px · 6 of 128 frames shown]
[frame 11/128]
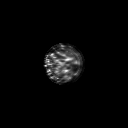
[frame 32/128]
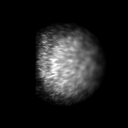
[frame 54/128]
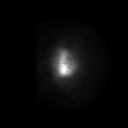
[frame 75/128]
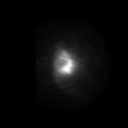
[frame 96/128]
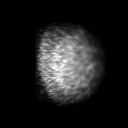
[frame 118/128]
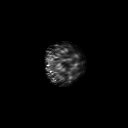

[Series 1: spect - (id) _(id)_cor · 4.1mm · 4.14mm/px · 6 of 128 frames shown]
[frame 11/128]
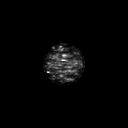
[frame 32/128]
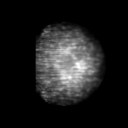
[frame 54/128]
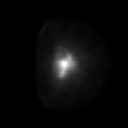
[frame 75/128]
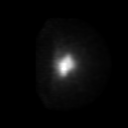
[frame 96/128]
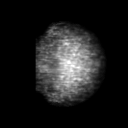
[frame 118/128]
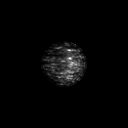

[12 of 12 positions shown; findings below may reference images not displayed]

The patient tolerated procedure well. No adverse effects were noted.

Bremsstrahlung planar and SPECT imaging of the abdomen following
intrahepatic arterial delivery of Y-90 microsphere was performed.

RADIOPHARMACEUTICALS:  35.5 millicurie yttrium 90 Sir spheres

Does delivered and two aliquots 19.1 millicuries and
millicuries.
FINDINGS: [AGE] microspheres therapy as above. First therapy the right
hepatic lobe. Radiosegmentectomy dosing.

Bremsstrahlung planar and SPECT imaging of the abdomen following
intrahepatic arterial delivery of B-JOmicrosphere demonstrates
radioactivity localized to the RIGHT hepatic lobe. No evidence of
extrahepatic activity.
IMPRESSION: Successful [AGE] microsphere delivery for treatment of unresectable
liver metastasis. First therapy to the RIGHT lobe.
Radiosegmentectomy dosing

Bremssstrahlung scan demonstrates activity localized to RIGHT
hepatic lobe with no extrahepatic activity identified.

## 2022-06-24 ENCOUNTER — Inpatient Hospital Stay: Payer: HMO | Attending: Oncology

## 2022-06-24 ENCOUNTER — Inpatient Hospital Stay (HOSPITAL_BASED_OUTPATIENT_CLINIC_OR_DEPARTMENT_OTHER): Payer: HMO | Admitting: Oncology

## 2022-06-24 ENCOUNTER — Encounter: Payer: Self-pay | Admitting: *Deleted

## 2022-06-24 ENCOUNTER — Inpatient Hospital Stay: Payer: HMO

## 2022-06-24 VITALS — BP 122/85 | HR 64 | Temp 98.1°F | Resp 18 | Ht 70.0 in | Wt 216.4 lb

## 2022-06-24 VITALS — BP 119/77 | HR 68

## 2022-06-24 DIAGNOSIS — Z5112 Encounter for antineoplastic immunotherapy: Secondary | ICD-10-CM | POA: Diagnosis not present

## 2022-06-24 DIAGNOSIS — C220 Liver cell carcinoma: Secondary | ICD-10-CM

## 2022-06-24 DIAGNOSIS — Z79899 Other long term (current) drug therapy: Secondary | ICD-10-CM | POA: Diagnosis not present

## 2022-06-24 LAB — CBC WITH DIFFERENTIAL (CANCER CENTER ONLY)
Abs Immature Granulocytes: 0.02 10*3/uL (ref 0.00–0.07)
Basophils Absolute: 0.1 10*3/uL (ref 0.0–0.1)
Basophils Relative: 1 %
Eosinophils Absolute: 0.2 10*3/uL (ref 0.0–0.5)
Eosinophils Relative: 4 %
HCT: 39.2 % (ref 39.0–52.0)
Hemoglobin: 11.8 g/dL — ABNORMAL LOW (ref 13.0–17.0)
Immature Granulocytes: 0 %
Lymphocytes Relative: 18 %
Lymphs Abs: 1 10*3/uL (ref 0.7–4.0)
MCH: 24.5 pg — ABNORMAL LOW (ref 26.0–34.0)
MCHC: 30.1 g/dL (ref 30.0–36.0)
MCV: 81.5 fL (ref 80.0–100.0)
Monocytes Absolute: 0.6 10*3/uL (ref 0.1–1.0)
Monocytes Relative: 10 %
Neutro Abs: 3.6 10*3/uL (ref 1.7–7.7)
Neutrophils Relative %: 67 %
Platelet Count: 309 10*3/uL (ref 150–400)
RBC: 4.81 MIL/uL (ref 4.22–5.81)
RDW: 19.9 % — ABNORMAL HIGH (ref 11.5–15.5)
WBC Count: 5.4 10*3/uL (ref 4.0–10.5)
nRBC: 0 % (ref 0.0–0.2)

## 2022-06-24 LAB — CMP (CANCER CENTER ONLY)
ALT: 12 U/L (ref 0–44)
AST: 22 U/L (ref 15–41)
Albumin: 4 g/dL (ref 3.5–5.0)
Alkaline Phosphatase: 72 U/L (ref 38–126)
Anion gap: 10 (ref 5–15)
BUN: 25 mg/dL — ABNORMAL HIGH (ref 8–23)
CO2: 20 mmol/L — ABNORMAL LOW (ref 22–32)
Calcium: 9.3 mg/dL (ref 8.9–10.3)
Chloride: 107 mmol/L (ref 98–111)
Creatinine: 1.55 mg/dL — ABNORMAL HIGH (ref 0.61–1.24)
GFR, Estimated: 49 mL/min — ABNORMAL LOW (ref >60)
Glucose, Bld: 174 mg/dL — ABNORMAL HIGH (ref 70–99)
Potassium: 4.2 mmol/L (ref 3.5–5.1)
Sodium: 137 mmol/L (ref 135–145)
Total Bilirubin: 0.6 mg/dL (ref 0.3–1.2)
Total Protein: 7.5 g/dL (ref 6.5–8.1)

## 2022-06-24 LAB — TOTAL PROTEIN, URINE DIPSTICK: Protein, ur: NEGATIVE mg/dL

## 2022-06-24 MED ORDER — SODIUM CHLORIDE 0.9 % IV SOLN
1200.0000 mg | Freq: Once | INTRAVENOUS | Status: AC
  Administered 2022-06-24: 1200 mg via INTRAVENOUS
  Filled 2022-06-24: qty 20

## 2022-06-24 MED ORDER — SODIUM CHLORIDE 0.9 % IV SOLN: Freq: Once | INTRAVENOUS | Status: AC

## 2022-06-24 MED ORDER — SODIUM CHLORIDE 0.9 % IV SOLN
15.0000 mg/kg | Freq: Once | INTRAVENOUS | Status: AC
  Administered 2022-06-24: 1600 mg via INTRAVENOUS
  Filled 2022-06-24: qty 64

## 2022-06-24 NOTE — Progress Notes (Signed)
Patient seen by Dr. Benay Spice today  Vitals are within treatment parameters.  Labs reviewed by Dr. Benay Spice and are not all within treatment parameters. OK to treat w/creatinine 1.55  Per physician team, patient is ready for treatment and there are NO modifications to the treatment plan.

## 2022-06-24 NOTE — Progress Notes (Signed)
  [facility] OFFICE PROGRESS NOTE   Diagnosis: Hepatocellular carcinoma  INTERVAL HISTORY:   [name] returns as scheduled.  He continues every 3-week atezolizumab/bevacizumab.  No rash or diarrhea.  He feels well.  Mild nosebleeding in the mornings.   Objective:  Vital signs in last 24 hours:  Blood pressure 122/85, pulse 64, temperature 98.1 F (36.7 C), temperature source Oral, resp. rate 18, height 5' 10"  (1.778 m), weight 216 lb 6.4 oz (98.2 kg), SpO2 100 %.    HEENT: Dried blood at the bilateral nostril Resp: Lungs clear bilaterally Cardio: Regular rate and rhythm GI: No hepatosplenomegaly Vascular: No leg edema  Skin: No rash  Lab Results:  Lab Results  Component Value Date   WBC 5.4 06/24/2022   HGB 11.8 (L) 06/24/2022   HCT 39.2 06/24/2022   MCV 81.5 06/24/2022   PLT 309 06/24/2022   NEUTROABS 3.6 06/24/2022    CMP  Lab Results  Component Value Date   NA 137 06/24/2022   K 4.2 06/24/2022   CL 107 06/24/2022   CO2 20 (L) 06/24/2022   GLUCOSE 174 (H) 06/24/2022   BUN 25 (H) 06/24/2022   CREATININE 1.55 (H) 06/24/2022   CALCIUM 9.3 06/24/2022   PROT 7.5 06/24/2022   ALBUMIN 4.0 06/24/2022   AST 22 06/24/2022   ALT 12 06/24/2022   ALKPHOS 72 06/24/2022   BILITOT 0.6 06/24/2022   GFRNONAA 49 (L) 06/24/2022   GFRAA 49 (L) 11/20/2020    Lab Results  Component Value Date   CEA1 1.9 06/21/2021    Lab Results  Component Value Date   INR 1.2 (H) 12/13/2021   LABPROT 12.3 (H) 12/13/2021    Imaging:  No results found.  Medications: I have reviewed the patient's current medications.   Assessment/Plan: Hepatocellular carcinoma CT angio chest aorta 01/30/2021-no evidence of thoracic aortic aneurysm, diffuse hepatic steatosis, suggestion of early cirrhosis, 7.1 cm enhancing masslike area in segment 5/6, prominent gastrohepatic and periportal nodes measuring up to 7 mm MRI liver 03/22/2021-arterial hyperenhancing subcapsular mass,  segment 6, 7 x 5.1 cm, LI-RADS 5, no pathologically large lymph nodes Ultrasound-guided biopsy 04/24/2021-hepatocellular carcinoma Y90 radioembolization of segment 6 and segment 7 hepatic arterial branches 06/21/2021 MRI abdomen 09/03/2021-new increased enhancement inferior and superior to the dominant right liver lesion, new 11 mm enhancing lesion in the left liver LIRADS 3, dominant right hepatic lesion stable in size MRI abdomen 12/19/2021-unchanged hyperenhancing mass in the anterior inferior right liver measuring 7.8 x 5 cm, interval enlargement of a arterial enhancing lesion in segment 2- LIRADS 5, new small enhancing lesions hepatic segment 4A measuring 1.2 x 0.9 cm and 0.8 x 0.6 cm- LIRADS 5 Cycle 1 atezolizumab /bevacizumab 02/07/2022 Cycle 2 atezolizumab/bevacizumab 02/27/2022 Cycle 3 atezolizumab/bevacizumab 03/21/2022 Cycle 4 atezolizumab/bevacizumab 04/22/2022 MRI abdomen 05/05/2022-no change in dominant segment 6 lesion, no change in hyperenhancing lesions in segment 4A and segment 2, no new lesions Cycle 5 atezolizumab/bevacizumab 05/13/2022 Cycle 6 atezolizumab/bevacizumab 06/04/2022 Cycle 7 atezolizumab/bevacizumab 06/24/2022 Ulcerative colitis-maintained on vedolizumab Peripheral arterial disease History of a thoracic aortic aneurysm CAD Diabetes Hypertension Chronic renal failure Paroxysmal atrial fibrillation   Disposition: [name] appears stable.  He continues atezolizumab/bevacizumab.  He is tolerating the treatment well.  He will complete another cycle today.  He will return for an office visit and treatment in 3 weeks.  We will plan for a restaging MRI in December or January.  Betsy Coder, MD  06/24/2022  9:22 AM

## 2022-06-24 NOTE — Patient Instructions (Signed)
Matthew Chen   Discharge Instructions: Thank you for choosing [facility] to provide your oncology and hematology care.   If you have a lab appointment with the [facility], please go directly to the [facility] and check in at the registration area.   Wear comfortable clothing and clothing appropriate for easy access to any Portacath or PICC line.   We strive to give you quality time with your provider. You may need to reschedule your appointment if you arrive late (15 or more minutes).  Arriving late affects you and other patients whose appointments are after yours.  Also, if you miss three or more appointments without notifying the office, you may be dismissed from the clinic at the provider's discretion.      For prescription refill requests, have your pharmacy contact our office and allow 72 hours for refills to be completed.    Today you received the following chemotherapy and/or immunotherapy agents [name]      To help prevent nausea and vomiting after your treatment, we encourage you to take your nausea medication as directed.  BELOW ARE SYMPTOMS THAT SHOULD BE REPORTED IMMEDIATELY: *FEVER GREATER THAN 100.4 F (38 C) OR HIGHER *CHILLS OR SWEATING *NAUSEA AND VOMITING THAT IS NOT CONTROLLED WITH YOUR NAUSEA MEDICATION *UNUSUAL SHORTNESS OF BREATH *UNUSUAL BRUISING OR BLEEDING *URINARY PROBLEMS (pain or burning when urinating, or frequent urination) *BOWEL PROBLEMS (unusual diarrhea, constipation, pain near the anus) TENDERNESS IN MOUTH AND THROAT WITH OR WITHOUT PRESENCE OF ULCERS (sore throat, sores in mouth, or a toothache) UNUSUAL RASH, SWELLING OR PAIN  UNUSUAL VAGINAL DISCHARGE OR ITCHING   Items with * indicate a potential emergency and should be followed up as soon as possible or go to the Emergency Department if any problems should occur.  Please show the CHEMOTHERAPY ALERT CARD or IMMUNOTHERAPY ALERT CARD at  check-in to the Emergency Department and triage nurse.  Should you have questions after your visit or need to cancel or reschedule your appointment, please contact [facility]  Dept: [phone]  and follow the prompts.  Office hours are 8:00 a.m. to 4:30 p.m. Monday - Friday. Please note that voicemails left after 4:00 p.m. may not be returned until the following business day.  We are closed weekends and major holidays. You have access to a nurse at all times for urgent questions. Please call the main number to the clinic Dept: [phone] and follow the prompts.   For any non-urgent questions, you may also contact your provider using MyChart. We now offer e-Visits for anyone 18 and older to request care online for non-urgent symptoms. For details visit [email or web address].   Also download the MyChart app! Go to the app store, search "MyChart", open the app, select [facility], and log in with your MyChart username and password.  Masks are optional in the cancer centers. If you would like for your care team to wear a mask while they are taking care of you, please let them know. You may have one support person who is at least 66 years old accompany you for your appointments.  Atezolizumab Injection What is this medication? ATEZOLIZUMAB (a te zoe LIZ ue mab) treats some types of cancer. It works by helping your immune system slow or stop the spread of cancer cells. It is a monoclonal antibody. This medicine may be used for other purposes; ask your health care provider or pharmacist if you have questions. COMMON BRAND  NAME(S): Tecentriq What should I tell my care team before I take this medication? They need to know if you have any of these conditions: Allogeneic stem cell transplant (uses someone else's stem cells) Autoimmune diseases, such as Crohn disease, ulcerative colitis, lupus History of chest radiation Nervous system problems, such as Guillain-Barre  syndrome, myasthenia gravis Organ transplant An unusual or allergic reaction to atezolizumab, other medications, foods, dyes, or preservatives Pregnant or trying to get pregnant Breast-feeding How should I use this medication? This medication is injected into a vein. It is given by your care team in a hospital or clinic setting. A special MedGuide will be given to you before each treatment. Be sure to read this information carefully each time. Talk to your care team about the use of this medication in children. While it may be prescribed for children as young as 2 years for selected conditions, precautions do apply. Overdosage: If you think you have taken too much of this medicine contact a poison control center or emergency room at once. NOTE: This medicine is only for you. Do not share this medicine with others. What if I miss a dose? Keep appointments for follow-up doses. It is important not to miss your dose. Call your care team if you are unable to keep an appointment. What may interact with this medication? Interactions have not been studied. This list may not describe all possible interactions. Give your health care provider a list of all the medicines, herbs, non-prescription drugs, or dietary supplements you use. Also tell them if you smoke, drink alcohol, or use illegal drugs. Some items may interact with your medicine. What should I watch for while using this medication? Your condition will be monitored carefully while you are receiving this medication. You may need blood work while taking this medication. This medication may cause serious skin reactions. They can happen weeks to months after starting the medication. Contact your care team right away if you notice fevers or flu-like symptoms with a rash. The rash may be red or purple and then turn into blisters or peeling of the skin. You may also notice a red rash with swelling of the face, lips, or lymph nodes in your neck or under  your arms. Tell your care team right away if you have any change in your eyesight. Talk to your care team if you may be pregnant. Serious birth defects can occur if you take this medication during pregnancy and for 5 months after the last dose. You will need a negative pregnancy test before starting this medication. Contraception is recommended while taking this medication and for 5 months after the last dose. Your care team can help you find the option that works for you. Do not breastfeed while taking this medication and for at least 5 months after the last dose. What side effects may I notice from receiving this medication? Side effects that you should report to your doctor or health care professional as soon as possible: Allergic reactions--skin rash, itching, hives, swelling of the face, lips, tongue, or throat Dry cough, shortness of breath or trouble breathing Eye pain, redness, irritation, or discharge with blurry or decreased vision Heart muscle inflammation--unusual weakness or fatigue, shortness of breath, chest pain, fast or irregular heartbeat, dizziness, swelling of the ankles, feet, or hands Hormone gland problems--headache, sensitivity to light, unusual weakness or fatigue, dizziness, fast or irregular heartbeat, increased sensitivity to cold or heat, excessive sweating, constipation, hair loss, increased thirst or amount of urine, tremors or  shaking, irritability Infusion reactions--chest pain, shortness of breath or trouble breathing, feeling faint or lightheaded Kidney injury (glomerulonephritis)--decrease in the amount of urine, red or dark brown urine, foamy or bubbly urine, swelling of the ankles, hands, or feet Liver injury--right upper belly pain, loss of appetite, nausea, light-colored stool, dark yellow or brown urine, yellowing skin or eyes, unusual weakness or fatigue Pain, tingling, or numbness in the hands or feet, muscle weakness, change in vision, confusion or trouble  speaking, loss of balance or coordination, trouble walking, seizures Rash, fever, and swollen lymph nodes Redness, blistering, peeling, or loosening of the skin, including inside the mouth Sudden or severe stomach pain, bloody diarrhea, fever, nausea, vomiting Side effects that usually do not require medical attention (report to your doctor or health care professional if they continue or are bothersome): Bone, joint, or muscle pain Diarrhea Fatigue Loss of appetite Nausea Skin rash This list may not describe all possible side effects. Call your doctor for medical advice about side effects. You may report side effects to FDA at 1-800-FDA-1088. Where should I keep my medication? This medication is given in a hospital or clinic. It will not be stored at home. NOTE: This sheet is a summary. It may not cover all possible information. If you have questions about this medicine, talk to your doctor, pharmacist, or health care provider.  2023 Elsevier/Gold Standard (2021-12-11 00:00:00)  Bevacizumab Injection What is this medication? BEVACIZUMAB (be va SIZ yoo mab) treats some types of cancer. It works by blocking a protein that causes cancer cells to grow and multiply. This helps to slow or stop the spread of cancer cells. It is a monoclonal antibody. This medicine may be used for other purposes; ask your health care provider or pharmacist if you have questions. COMMON BRAND NAME(S): Alymsys, Avastin, MVASI, Noah Charon What should I tell my care team before I take this medication? They need to know if you have any of these conditions: Blood clots Coughing up blood Having or recent surgery Heart failure High blood pressure History of a connection between 2 or more body parts that do not usually connect (fistula) History of a tear in your stomach or intestines Protein in your urine An unusual or allergic reaction to bevacizumab, other medications, foods, dyes, or preservatives Pregnant or trying  to get pregnant Breast-feeding How should I use this medication? This medication is injected into a vein. It is given by your care team in a hospital or clinic setting. Talk to your care team the use of this medication in children. Special care may be needed. Overdosage: If you think you have taken too much of this medicine contact a poison control center or emergency room at once. NOTE: This medicine is only for you. Do not share this medicine with others. What if I miss a dose? Keep appointments for follow-up doses. It is important not to miss your dose. Call your care team if you are unable to keep an appointment. What may interact with this medication? Interactions are not expected. This list may not describe all possible interactions. Give your health care provider a list of all the medicines, herbs, non-prescription drugs, or dietary supplements you use. Also tell them if you smoke, drink alcohol, or use illegal drugs. Some items may interact with your medicine. What should I watch for while using this medication? Your condition will be monitored carefully while you are receiving this medication. You may need blood work while taking this medication. This medication may make you  feel generally unwell. This is not uncommon as chemotherapy can affect healthy cells as well as cancer cells. Report any side effects. Continue your course of treatment even though you feel ill unless your care team tells you to stop. This medication may increase your risk to bruise or bleed. Call your care team if you notice any unusual bleeding. Before having surgery, talk to your care team to make sure it is ok. This medication can increase the risk of poor healing of your surgical site or wound. You will need to stop this medication for 28 days before surgery. After surgery, wait at least 28 days before restarting this medication. Make sure the surgical site or wound is healed enough before restarting this  medication. Talk to your care team if questions. Talk to your care team if you may be pregnant. Serious birth defects can occur if you take this medication during pregnancy and for 6 months after the last dose. Contraception is recommended while taking this medication and for 6 months after the last dose. Your care team can help you find the option that works for you. Do not breastfeed while taking this medication and for 6 months after the last dose. This medication can cause infertility. Talk to your care team if you are concerned about your fertility. What side effects may I notice from receiving this medication? Side effects that you should report to your care team as soon as possible: Allergic reactions--skin rash, itching, hives, swelling of the face, lips, tongue, or throat Bleeding--bloody or black, tar-like stools, vomiting blood or brown material that looks like coffee grounds, red or dark brown urine, small red or purple spots on skin, unusual bruising or bleeding Blood clot--pain, swelling, or warmth in the leg, shortness of breath, chest pain Heart attack--pain or tightness in the chest, shoulders, arms, or jaw, nausea, shortness of breath, cold or clammy skin, feeling faint or lightheaded Heart failure--shortness of breath, swelling of the ankles, feet, or hands, sudden weight gain, unusual weakness or fatigue Increase in blood pressure Infection--fever, chills, cough, sore throat, wounds that don't heal, pain or trouble when passing urine, general feeling of discomfort or being unwell Infusion reactions--chest pain, shortness of breath or trouble breathing, feeling faint or lightheaded Kidney injury--decrease in the amount of urine, swelling of the ankles, hands, or feet Stomach pain that is severe, does not go away, or gets worse Stroke--sudden numbness or weakness of the face, arm, or leg, trouble speaking, confusion, trouble walking, loss of balance or coordination, dizziness,  severe headache, change in vision Sudden and severe headache, confusion, change in vision, seizures, which may be signs of posterior reversible encephalopathy syndrome (PRES) Side effects that usually do not require medical attention (report to your care team if they continue or are bothersome): Back pain Change in taste Diarrhea Dry skin Increased tears Nosebleed This list may not describe all possible side effects. Call your doctor for medical advice about side effects. You may report side effects to FDA at 1-800-FDA-1088. Where should I keep my medication? This medication is given in a hospital or clinic. It will not be stored at home. NOTE: This sheet is a summary. It may not cover all possible information. If you have questions about this medicine, talk to your doctor, pharmacist, or health care provider.  2023 Elsevier/Gold Standard (2021-12-11 00:00:00)

## 2022-06-24 NOTE — Progress Notes (Deleted)
  [facility] OFFICE PROGRESS NOTE   Diagnosis:   INTERVAL HISTORY:   ***  Objective:  Vital signs in last 24 hours:  Blood pressure 122/85, pulse 64, temperature 98.1 F (36.7 C), temperature source Oral, resp. rate 18, height 5' 10"  (1.778 m), weight 216 lb 6.4 oz (98.2 kg), SpO2 100 %.    HEENT: *** Lymphatics: *** Resp: *** Cardio: *** GI: *** Vascular: *** Neuro:***  Skin:***   Portacath/PICC-without erythema  Lab Results:  Lab Results  Component Value Date   WBC 5.4 06/24/2022   HGB 11.8 (L) 06/24/2022   HCT 39.2 06/24/2022   MCV 81.5 06/24/2022   PLT 309 06/24/2022   NEUTROABS 3.6 06/24/2022    CMP  Lab Results  Component Value Date   NA 139 06/04/2022   K 4.1 06/04/2022   CL 107 06/04/2022   CO2 23 06/04/2022   GLUCOSE 168 (H) 06/04/2022   BUN 24 (H) 06/04/2022   CREATININE 1.61 (H) 06/04/2022   CALCIUM 9.3 06/04/2022   PROT 6.7 06/04/2022   ALBUMIN 3.9 06/04/2022   AST 22 06/04/2022   ALT 15 06/04/2022   ALKPHOS 57 06/04/2022   BILITOT 0.7 06/04/2022   GFRNONAA 47 (L) 06/04/2022   GFRAA 49 (L) 11/20/2020    Lab Results  Component Value Date   CEA1 1.9 06/21/2021    Lab Results  Component Value Date   INR 1.2 (H) 12/13/2021   LABPROT 12.3 (H) 12/13/2021    Imaging:  No results found.  Medications: I have reviewed the patient's current medications.   Assessment/Plan: Hepatocellular carcinoma CT angio chest aorta 01/30/2021-no evidence of thoracic aortic aneurysm, diffuse hepatic steatosis, suggestion of early cirrhosis, 7.1 cm enhancing masslike area in segment 5/6, prominent gastrohepatic and periportal nodes measuring up to 7 mm MRI liver 03/22/2021-arterial hyperenhancing subcapsular mass, segment 6, 7 x 5.1 cm, LI-RADS 5, no pathologically large lymph nodes Ultrasound-guided biopsy 04/24/2021-hepatocellular carcinoma Y90 radioembolization of segment 6 and segment 7 hepatic arterial branches 06/21/2021 MRI  abdomen 09/03/2021-new increased enhancement inferior and superior to the dominant right liver lesion, new 11 mm enhancing lesion in the left liver LIRADS 3, dominant right hepatic lesion stable in size MRI abdomen 12/19/2021-unchanged hyperenhancing mass in the anterior inferior right liver measuring 7.8 x 5 cm, interval enlargement of a arterial enhancing lesion in segment 2- LIRADS 5, new small enhancing lesions hepatic segment 4A measuring 1.2 x 0.9 cm and 0.8 x 0.6 cm- LIRADS 5 Cycle 1 atezolizumab /bevacizumab 02/07/2022 Cycle 2 atezolizumab/bevacizumab 02/27/2022 Cycle 3 atezolizumab/bevacizumab 03/21/2022 Cycle 4 atezolizumab/bevacizumab 04/22/2022 MRI abdomen 05/05/2022-no change in dominant segment 6 lesion, no change in hyperenhancing lesions in segment 4A and segment 2, no new lesions Cycle 5 atezolizumab/bevacizumab 05/13/2022 Cycle 6 atezolizumab/bevacizumab 06/04/2022 Ulcerative colitis-maintained on vedolizumab Peripheral arterial disease History of a thoracic aortic aneurysm CAD Diabetes Hypertension Chronic renal failure Paroxysmal atrial fibrillation       Disposition: ***  Betsy Coder, MD  06/24/2022  9:09 AM

## 2022-06-24 NOTE — Progress Notes (Deleted)
   Established Patient Office Visit  Subjective   Patient ID: Matthew Chen, male    DOB: 05-20-1956  Age: 66 y.o. MRN: [id number]  Chief Complaint  Patient presents with   Treatment Note    HPI  {History (Optional):23778}  ROS    Objective:     There were no vitals taken for this visit. {Vitals History (Optional):23777}  Physical Exam   Results for orders placed or performed in visit on 06/24/22  Total Protein, Urine dipstick  Result Value Ref Range   Protein, ur NEGATIVE NEGATIVE mg/dL  CMP (Cancer Center only)  Result Value Ref Range   Sodium 137 135 - 145 mmol/L   Potassium 4.2 3.5 - 5.1 mmol/L   Chloride 107 98 - 111 mmol/L   CO2 20 (L) 22 - 32 mmol/L   Glucose, Bld 174 (H) 70 - 99 mg/dL   BUN 25 (H) 8 - 23 mg/dL   Creatinine 1.55 (H) 0.61 - 1.24 mg/dL   Calcium 9.3 8.9 - 10.3 mg/dL   Total Protein 7.5 6.5 - 8.1 g/dL   Albumin 4.0 3.5 - 5.0 g/dL   AST 22 15 - 41 U/L   ALT 12 0 - 44 U/L   Alkaline Phosphatase 72 38 - 126 U/L   Total Bilirubin 0.6 0.3 - 1.2 mg/dL   GFR, Estimated 49 (L) >60 mL/min   Anion gap 10 5 - 15  CBC with Differential (Cancer Center Only)  Result Value Ref Range   WBC Count 5.4 4.0 - 10.5 K/uL   RBC 4.81 4.22 - 5.81 MIL/uL   Hemoglobin 11.8 (L) 13.0 - 17.0 g/dL   HCT 39.2 39.0 - 52.0 %   MCV 81.5 80.0 - 100.0 fL   MCH 24.5 (L) 26.0 - 34.0 pg   MCHC 30.1 30.0 - 36.0 g/dL   RDW 19.9 (H) 11.5 - 15.5 %   Platelet Count 309 150 - 400 K/uL   nRBC 0.0 0.0 - 0.2 %   Neutrophils Relative % 67 %   Neutro Abs 3.6 1.7 - 7.7 K/uL   Lymphocytes Relative 18 %   Lymphs Abs 1.0 0.7 - 4.0 K/uL   Monocytes Relative 10 %   Monocytes Absolute 0.6 0.1 - 1.0 K/uL   Eosinophils Relative 4 %   Eosinophils Absolute 0.2 0.0 - 0.5 K/uL   Basophils Relative 1 %   Basophils Absolute 0.1 0.0 - 0.1 K/uL   Immature Granulocytes 0 %   Abs Immature Granulocytes 0.02 0.00 - 0.07 K/uL    {Labs (Optional):23779}  The ASCVD Risk score (Arnett DK, et al.,  2019) failed to calculate for the following reasons:   The valid total cholesterol range is 130 to 320 mg/dL    Assessment & Plan:   Problem List Items Addressed This Visit   None   No follow-ups on file.    Tania Ade, RN

## 2022-06-25 ENCOUNTER — Telehealth: Payer: Self-pay

## 2022-06-25 ENCOUNTER — Other Ambulatory Visit: Payer: Self-pay

## 2022-06-25 DIAGNOSIS — E1169 Type 2 diabetes mellitus with other specified complication: Secondary | ICD-10-CM

## 2022-06-25 MED ORDER — TRULICITY 1.5 MG/0.5ML ~~LOC~~ SOAJ: 1.5000 mg | SUBCUTANEOUS | 3 refills | Status: DC

## 2022-06-25 NOTE — Telephone Encounter (Signed)
Pt called and states his pharmacy advised him that Victoza is on nationwide backorder. Pt asks what can he use in place of it? Thank you

## 2022-06-26 ENCOUNTER — Telehealth: Payer: Self-pay

## 2022-06-26 NOTE — Telephone Encounter (Signed)
Received [employer] form requesting for a benefit investigation & patient assistance program. Form faxed to prior auth team.

## 2022-06-26 NOTE — Telephone Encounter (Signed)
Donavan Burnet (Key: [id number])  Your information has been sent to [employer].

## 2022-07-02 ENCOUNTER — Telehealth: Payer: Self-pay

## 2022-07-02 NOTE — Telephone Encounter (Signed)
PA FOR TRULICITY APPROVED.

## 2022-07-15 ENCOUNTER — Encounter: Payer: Self-pay | Admitting: *Deleted

## 2022-07-15 ENCOUNTER — Inpatient Hospital Stay: Payer: HMO

## 2022-07-15 ENCOUNTER — Inpatient Hospital Stay: Payer: HMO | Attending: Oncology

## 2022-07-15 ENCOUNTER — Inpatient Hospital Stay (HOSPITAL_BASED_OUTPATIENT_CLINIC_OR_DEPARTMENT_OTHER): Payer: HMO | Admitting: Oncology

## 2022-07-15 VITALS — BP 100/71 | HR 92 | Temp 98.2°F | Resp 18 | Wt 220.8 lb

## 2022-07-15 VITALS — BP 107/59 | HR 60

## 2022-07-15 DIAGNOSIS — C220 Liver cell carcinoma: Secondary | ICD-10-CM

## 2022-07-15 DIAGNOSIS — Z79899 Other long term (current) drug therapy: Secondary | ICD-10-CM | POA: Diagnosis not present

## 2022-07-15 DIAGNOSIS — Z5112 Encounter for antineoplastic immunotherapy: Secondary | ICD-10-CM | POA: Diagnosis present

## 2022-07-15 LAB — CBC WITH DIFFERENTIAL (CANCER CENTER ONLY)
Abs Immature Granulocytes: 0.01 10*3/uL (ref 0.00–0.07)
Basophils Absolute: 0 10*3/uL (ref 0.0–0.1)
Basophils Relative: 1 %
Eosinophils Absolute: 0.2 10*3/uL (ref 0.0–0.5)
Eosinophils Relative: 4 %
HCT: 37.5 % — ABNORMAL LOW (ref 39.0–52.0)
Hemoglobin: 11.4 g/dL — ABNORMAL LOW (ref 13.0–17.0)
Immature Granulocytes: 0 %
Lymphocytes Relative: 17 %
Lymphs Abs: 0.8 10*3/uL (ref 0.7–4.0)
MCH: 24.6 pg — ABNORMAL LOW (ref 26.0–34.0)
MCHC: 30.4 g/dL (ref 30.0–36.0)
MCV: 80.8 fL (ref 80.0–100.0)
Monocytes Absolute: 0.6 10*3/uL (ref 0.1–1.0)
Monocytes Relative: 12 %
Neutro Abs: 3.2 10*3/uL (ref 1.7–7.7)
Neutrophils Relative %: 66 %
Platelet Count: 182 10*3/uL (ref 150–400)
RBC: 4.64 MIL/uL (ref 4.22–5.81)
RDW: 20.9 % — ABNORMAL HIGH (ref 11.5–15.5)
WBC Count: 4.8 10*3/uL (ref 4.0–10.5)
nRBC: 0 % (ref 0.0–0.2)

## 2022-07-15 LAB — CMP (CANCER CENTER ONLY)
ALT: 14 U/L (ref 0–44)
AST: 26 U/L (ref 15–41)
Albumin: 3.8 g/dL (ref 3.5–5.0)
Alkaline Phosphatase: 55 U/L (ref 38–126)
Anion gap: 8 (ref 5–15)
BUN: 20 mg/dL (ref 8–23)
CO2: 24 mmol/L (ref 22–32)
Calcium: 9 mg/dL (ref 8.9–10.3)
Chloride: 108 mmol/L (ref 98–111)
Creatinine: 1.75 mg/dL — ABNORMAL HIGH (ref 0.61–1.24)
GFR, Estimated: 42 mL/min — ABNORMAL LOW (ref >60)
Glucose, Bld: 151 mg/dL — ABNORMAL HIGH (ref 70–99)
Potassium: 4.1 mmol/L (ref 3.5–5.1)
Sodium: 140 mmol/L (ref 135–145)
Total Bilirubin: 0.6 mg/dL (ref 0.3–1.2)
Total Protein: 6.8 g/dL (ref 6.5–8.1)

## 2022-07-15 MED ORDER — SODIUM CHLORIDE 0.9 % IV SOLN
1200.0000 mg | Freq: Once | INTRAVENOUS | Status: AC
  Administered 2022-07-15: 1200 mg via INTRAVENOUS
  Filled 2022-07-15: qty 20

## 2022-07-15 MED ORDER — SODIUM CHLORIDE 0.9 % IV SOLN
15.0000 mg/kg | Freq: Once | INTRAVENOUS | Status: AC
  Administered 2022-07-15: 1600 mg via INTRAVENOUS
  Filled 2022-07-15: qty 64

## 2022-07-15 MED ORDER — SODIUM CHLORIDE 0.9 % IV SOLN: Freq: Once | INTRAVENOUS | Status: AC

## 2022-07-15 NOTE — Progress Notes (Signed)
Patient seen by Dr. Benay Spice today  Vitals are within treatment parameters. BP lower than usual--not symptomatic  Labs reviewed by Dr. Benay Spice and are not all within treatment parameters. Creatinine 1.75--OK to treat  Per physician team, patient is ready for treatment and there are NO modifications to the treatment plan.

## 2022-07-15 NOTE — Patient Instructions (Signed)
[facility]   Discharge Instructions: Thank you for choosing [facility] to provide your oncology and hematology care.   If you have a lab appointment with the [facility], please go directly to the [facility] and check in at the registration area.   Wear comfortable clothing and clothing appropriate for easy access to any Portacath or PICC line.   We strive to give you quality time with your provider. You may need to reschedule your appointment if you arrive late (15 or more minutes).  Arriving late affects you and other patients whose appointments are after yours.  Also, if you miss three or more appointments without notifying the office, you may be dismissed from the clinic at the provider's discretion.      For prescription refill requests, have your pharmacy contact our office and allow 72 hours for refills to be completed.    Today you received the following chemotherapy and/or immunotherapy agents Avastin, Tecentriq.   To help prevent nausea and vomiting after your treatment, we encourage you to take your nausea medication as directed.  BELOW ARE SYMPTOMS THAT SHOULD BE REPORTED IMMEDIATELY: *FEVER GREATER THAN 100.4 F (38 C) OR HIGHER *CHILLS OR SWEATING *NAUSEA AND VOMITING THAT IS NOT CONTROLLED WITH YOUR NAUSEA MEDICATION *UNUSUAL SHORTNESS OF BREATH *UNUSUAL BRUISING OR BLEEDING *URINARY PROBLEMS (pain or burning when urinating, or frequent urination) *BOWEL PROBLEMS (unusual diarrhea, constipation, pain near the anus) TENDERNESS IN MOUTH AND THROAT WITH OR WITHOUT PRESENCE OF ULCERS (sore throat, sores in mouth, or a toothache) UNUSUAL RASH, SWELLING OR PAIN  UNUSUAL VAGINAL DISCHARGE OR ITCHING   Items with * indicate a potential emergency and should be followed up as soon as possible or go to the Emergency Department if any problems should occur.  Please show the CHEMOTHERAPY ALERT CARD or IMMUNOTHERAPY ALERT CARD at  check-in to the Emergency Department and triage nurse.  Should you have questions after your visit or need to cancel or reschedule your appointment, please contact [facility]  Dept: [phone]  and follow the prompts.  Office hours are 8:00 a.m. to 4:30 p.m. Monday - Friday. Please note that voicemails left after 4:00 p.m. may not be returned until the following business day.  We are closed weekends and major holidays. You have access to a nurse at all times for urgent questions. Please call the main number to the clinic Dept: [phone] and follow the prompts.   For any non-urgent questions, you may also contact your provider using MyChart. We now offer e-Visits for anyone 29 and older to request care online for non-urgent symptoms. For details visit [email or web address].   Also download the MyChart app! Go to the app store, search "MyChart", open the app, select [facility], and log in with your MyChart username and password.  Masks are optional in the cancer centers. If you would like for your care team to wear a mask while they are taking care of you, please let them know. You may have one support person who is at least 66 years old accompany you for your appointments. Bevacizumab Injection What is this medication? BEVACIZUMAB (be va SIZ yoo mab) treats some types of cancer. It works by blocking a protein that causes cancer cells to grow and multiply. This helps to slow or stop the spread of cancer cells. It is a monoclonal antibody. This medicine may be used for other purposes; ask your health care provider or pharmacist if  you have questions. COMMON BRAND NAME(S): Alymsys, Avastin, MVASI, Noah Charon What should I tell my care team before I take this medication? They need to know if you have any of these conditions: Blood clots Coughing up blood Having or recent surgery Heart failure High blood pressure History of a connection between 2 or more body parts  that do not usually connect (fistula) History of a tear in your stomach or intestines Protein in your urine An unusual or allergic reaction to bevacizumab, other medications, foods, dyes, or preservatives Pregnant or trying to get pregnant Breast-feeding How should I use this medication? This medication is injected into a vein. It is given by your care team in a hospital or clinic setting. Talk to your care team the use of this medication in children. Special care may be needed. Overdosage: If you think you have taken too much of this medicine contact a poison control center or emergency room at once. NOTE: This medicine is only for you. Do not share this medicine with others. What if I miss a dose? Keep appointments for follow-up doses. It is important not to miss your dose. Call your care team if you are unable to keep an appointment. What may interact with this medication? Interactions are not expected. This list may not describe all possible interactions. Give your health care provider a list of all the medicines, herbs, non-prescription drugs, or dietary supplements you use. Also tell them if you smoke, drink alcohol, or use illegal drugs. Some items may interact with your medicine. What should I watch for while using this medication? Your condition will be monitored carefully while you are receiving this medication. You may need blood work while taking this medication. This medication may make you feel generally unwell. This is not uncommon as chemotherapy can affect healthy cells as well as cancer cells. Report any side effects. Continue your course of treatment even though you feel ill unless your care team tells you to stop. This medication may increase your risk to bruise or bleed. Call your care team if you notice any unusual bleeding. Before having surgery, talk to your care team to make sure it is ok. This medication can increase the risk of poor healing of your surgical site or  wound. You will need to stop this medication for 28 days before surgery. After surgery, wait at least 28 days before restarting this medication. Make sure the surgical site or wound is healed enough before restarting this medication. Talk to your care team if questions. Talk to your care team if you may be pregnant. Serious birth defects can occur if you take this medication during pregnancy and for 6 months after the last dose. Contraception is recommended while taking this medication and for 6 months after the last dose. Your care team can help you find the option that works for you. Do not breastfeed while taking this medication and for 6 months after the last dose. This medication can cause infertility. Talk to your care team if you are concerned about your fertility. What side effects may I notice from receiving this medication? Side effects that you should report to your care team as soon as possible: Allergic reactions--skin rash, itching, hives, swelling of the face, lips, tongue, or throat Bleeding--bloody or black, tar-like stools, vomiting blood or brown material that looks like coffee grounds, red or dark brown urine, small red or purple spots on skin, unusual bruising or bleeding Blood clot--pain, swelling, or warmth in the leg, shortness of  breath, chest pain Heart attack--pain or tightness in the chest, shoulders, arms, or jaw, nausea, shortness of breath, cold or clammy skin, feeling faint or lightheaded Heart failure--shortness of breath, swelling of the ankles, feet, or hands, sudden weight gain, unusual weakness or fatigue Increase in blood pressure Infection--fever, chills, cough, sore throat, wounds that don't heal, pain or trouble when passing urine, general feeling of discomfort or being unwell Infusion reactions--chest pain, shortness of breath or trouble breathing, feeling faint or lightheaded Kidney injury--decrease in the amount of urine, swelling of the ankles, hands, or  feet Stomach pain that is severe, does not go away, or gets worse Stroke--sudden numbness or weakness of the face, arm, or leg, trouble speaking, confusion, trouble walking, loss of balance or coordination, dizziness, severe headache, change in vision Sudden and severe headache, confusion, change in vision, seizures, which may be signs of posterior reversible encephalopathy syndrome (PRES) Side effects that usually do not require medical attention (report to your care team if they continue or are bothersome): Back pain Change in taste Diarrhea Dry skin Increased tears Nosebleed This list may not describe all possible side effects. Call your doctor for medical advice about side effects. You may report side effects to FDA at 1-800-FDA-1088. Where should I keep my medication? This medication is given in a hospital or clinic. It will not be stored at home. NOTE: This sheet is a summary. It may not cover all possible information. If you have questions about this medicine, talk to your doctor, pharmacist, or health care provider.  2023 Elsevier/Gold Standard (2021-11-30 00:00:00)  Atezolizumab Injection What is this medication? ATEZOLIZUMAB (a te zoe LIZ ue mab) treats some types of cancer. It works by helping your immune system slow or stop the spread of cancer cells. It is a monoclonal antibody. This medicine may be used for other purposes; ask your health care provider or pharmacist if you have questions. COMMON BRAND NAME(S): Tecentriq What should I tell my care team before I take this medication? They need to know if you have any of these conditions: Allogeneic stem cell transplant (uses someone else's stem cells) Autoimmune diseases, such as Crohn disease, ulcerative colitis, lupus History of chest radiation Nervous system problems, such as Guillain-Barre syndrome, myasthenia gravis Organ transplant An unusual or allergic reaction to atezolizumab, other medications, foods, dyes, or  preservatives Pregnant or trying to get pregnant Breast-feeding How should I use this medication? This medication is injected into a vein. It is given by your care team in a hospital or clinic setting. A special MedGuide will be given to you before each treatment. Be sure to read this information carefully each time. Talk to your care team about the use of this medication in children. While it may be prescribed for children as young as 2 years for selected conditions, precautions do apply. Overdosage: If you think you have taken too much of this medicine contact a poison control center or emergency room at once. NOTE: This medicine is only for you. Do not share this medicine with others. What if I miss a dose? Keep appointments for follow-up doses. It is important not to miss your dose. Call your care team if you are unable to keep an appointment. What may interact with this medication? Interactions have not been studied. This list may not describe all possible interactions. Give your health care provider a list of all the medicines, herbs, non-prescription drugs, or dietary supplements you use. Also tell them if you smoke, drink alcohol, or  use illegal drugs. Some items may interact with your medicine. What should I watch for while using this medication? Your condition will be monitored carefully while you are receiving this medication. You may need blood work while taking this medication. This medication may cause serious skin reactions. They can happen weeks to months after starting the medication. Contact your care team right away if you notice fevers or flu-like symptoms with a rash. The rash may be red or purple and then turn into blisters or peeling of the skin. You may also notice a red rash with swelling of the face, lips, or lymph nodes in your neck or under your arms. Tell your care team right away if you have any change in your eyesight. Talk to your care team if you may be pregnant.  Serious birth defects can occur if you take this medication during pregnancy and for 5 months after the last dose. You will need a negative pregnancy test before starting this medication. Contraception is recommended while taking this medication and for 5 months after the last dose. Your care team can help you find the option that works for you. Do not breastfeed while taking this medication and for at least 5 months after the last dose. What side effects may I notice from receiving this medication? Side effects that you should report to your doctor or health care professional as soon as possible: Allergic reactions--skin rash, itching, hives, swelling of the face, lips, tongue, or throat Dry cough, shortness of breath or trouble breathing Eye pain, redness, irritation, or discharge with blurry or decreased vision Heart muscle inflammation--unusual weakness or fatigue, shortness of breath, chest pain, fast or irregular heartbeat, dizziness, swelling of the ankles, feet, or hands Hormone gland problems--headache, sensitivity to light, unusual weakness or fatigue, dizziness, fast or irregular heartbeat, increased sensitivity to cold or heat, excessive sweating, constipation, hair loss, increased thirst or amount of urine, tremors or shaking, irritability Infusion reactions--chest pain, shortness of breath or trouble breathing, feeling faint or lightheaded Kidney injury (glomerulonephritis)--decrease in the amount of urine, red or dark brown urine, foamy or bubbly urine, swelling of the ankles, hands, or feet Liver injury--right upper belly pain, loss of appetite, nausea, light-colored stool, dark yellow or brown urine, yellowing skin or eyes, unusual weakness or fatigue Pain, tingling, or numbness in the hands or feet, muscle weakness, change in vision, confusion or trouble speaking, loss of balance or coordination, trouble walking, seizures Rash, fever, and swollen lymph nodes Redness, blistering,  peeling, or loosening of the skin, including inside the mouth Sudden or severe stomach pain, bloody diarrhea, fever, nausea, vomiting Side effects that usually do not require medical attention (report to your doctor or health care professional if they continue or are bothersome): Bone, joint, or muscle pain Diarrhea Fatigue Loss of appetite Nausea Skin rash This list may not describe all possible side effects. Call your doctor for medical advice about side effects. You may report side effects to FDA at 1-800-FDA-1088. Where should I keep my medication? This medication is given in a hospital or clinic. It will not be stored at home. NOTE: This sheet is a summary. It may not cover all possible information. If you have questions about this medicine, talk to your doctor, pharmacist, or health care provider.  2023 Elsevier/Gold Standard (2021-12-11 00:00:00)

## 2022-07-15 NOTE — Progress Notes (Signed)
  [facility] OFFICE PROGRESS NOTE   Diagnosis: Hepatocellular carcinoma  INTERVAL HISTORY:   Matthew Chen returns as scheduled.  He completed another cycle of atezolizumab/bevacizumab on 06/24/2022.  No rash or symptom of thrombosis.  He has mild bleeding when he blows his nose.  No other bleeding.  He has chronic intermittent diarrhea.  This has not changed since beginning systemic therapy.  No new complaint.  Objective:  Vital signs in last 24 hours:  Blood pressure 100/71, pulse 92, temperature 98.2 F (36.8 C), temperature source Temporal, resp. rate 18, weight 220 lb 12.8 oz (100.2 kg), SpO2 97 %.   Resp: Lungs clear bilaterally, distant breath sounds Cardio: Regular rate and rhythm GI: No hepatosplenomegaly, nontender, no mass, no apparent ascites Vascular: No leg edema     Lab Results:  Lab Results  Component Value Date   WBC 4.8 07/15/2022   HGB 11.4 (L) 07/15/2022   HCT 37.5 (L) 07/15/2022   MCV 80.8 07/15/2022   PLT 182 07/15/2022   NEUTROABS 3.2 07/15/2022    CMP  Lab Results  Component Value Date   NA 137 06/24/2022   K 4.2 06/24/2022   CL 107 06/24/2022   CO2 20 (L) 06/24/2022   GLUCOSE 174 (H) 06/24/2022   BUN 25 (H) 06/24/2022   CREATININE 1.55 (H) 06/24/2022   CALCIUM 9.3 06/24/2022   PROT 7.5 06/24/2022   ALBUMIN 4.0 06/24/2022   AST 22 06/24/2022   ALT 12 06/24/2022   ALKPHOS 72 06/24/2022   BILITOT 0.6 06/24/2022   GFRNONAA 49 (L) 06/24/2022   GFRAA 49 (L) 11/20/2020    Lab Results  Component Value Date   CEA1 1.9 06/21/2021    Medications: I have reviewed the patient's current medications.   Assessment/Plan: Hepatocellular carcinoma CT angio chest aorta 01/30/2021-no evidence of thoracic aortic aneurysm, diffuse hepatic steatosis, suggestion of early cirrhosis, 7.1 cm enhancing masslike area in segment 5/6, prominent gastrohepatic and periportal nodes measuring up to 7 mm MRI liver 03/22/2021-arterial hyperenhancing  subcapsular mass, segment 6, 7 x 5.1 cm, LI-RADS 5, no pathologically large lymph nodes Ultrasound-guided biopsy 04/24/2021-hepatocellular carcinoma Y90 radioembolization of segment 6 and segment 7 hepatic arterial branches 06/21/2021 MRI abdomen 09/03/2021-new increased enhancement inferior and superior to the dominant right liver lesion, new 11 mm enhancing lesion in the left liver LIRADS 3, dominant right hepatic lesion stable in size MRI abdomen 12/19/2021-unchanged hyperenhancing mass in the anterior inferior right liver measuring 7.8 x 5 cm, interval enlargement of a arterial enhancing lesion in segment 2- LIRADS 5, new small enhancing lesions hepatic segment 4A measuring 1.2 x 0.9 cm and 0.8 x 0.6 cm- LIRADS 5 Cycle 1 atezolizumab /bevacizumab 02/07/2022 Cycle 2 atezolizumab/bevacizumab 02/27/2022 Cycle 3 atezolizumab/bevacizumab 03/21/2022 Cycle 4 atezolizumab/bevacizumab 04/22/2022 MRI abdomen 05/05/2022-no change in dominant segment 6 lesion, no change in hyperenhancing lesions in segment 4A and segment 2, no new lesions Cycle 5 atezolizumab/bevacizumab 05/13/2022 Cycle 6 atezolizumab/bevacizumab 06/04/2022 Cycle 7 atezolizumab/bevacizumab 06/24/2022 Cycle 8 atezolizumab/bevacizumab 07/15/2022 Ulcerative colitis-maintained on vedolizumab Peripheral arterial disease History of a thoracic aortic aneurysm CAD Diabetes Hypertension Chronic renal failure Paroxysmal atrial fibrillation     Disposition: Matthew Chen appears stable.  He will complete another cycle of atezolizumab/bevacizumab today.  He will undergo a restaging liver MRI after this cycle.  He will be scheduled for an office visit on 08/14/2022.  Matthew Coder, MD  07/15/2022  9:01 AM

## 2022-07-16 ENCOUNTER — Telehealth: Payer: Self-pay

## 2022-07-16 NOTE — Telephone Encounter (Signed)
Received entyvio patience assistance form. Faxed to prior auth team.

## 2022-07-17 LAB — AFP TUMOR MARKER: AFP, Serum, Tumor Marker: 7.6 ng/mL (ref 0.0–8.4)

## 2022-07-19 ENCOUNTER — Telehealth: Payer: Self-pay | Admitting: *Deleted

## 2022-07-19 NOTE — Telephone Encounter (Signed)
Left VM with MRI appointment on 08/03/22 at [facility] and that information will be sent via [facility]. Requested he confirm receipt via MyChart or call. Arrive at 3:50 pm for a 4:20 pm scan. NPO for 4 hours at [facility] at [address]. Call [phone] if need to reschedule.

## 2022-07-20 ENCOUNTER — Ambulatory Visit
Admission: RE | Admit: 2022-07-20 | Discharge: 2022-07-20 | Disposition: A | Discharge status: Home / Self Care | Payer: HMO | Source: Ambulatory Visit | Attending: Oncology | Admitting: Oncology

## 2022-07-20 ENCOUNTER — Other Ambulatory Visit: Payer: HMO

## 2022-07-20 DIAGNOSIS — C220 Liver cell carcinoma: Secondary | ICD-10-CM

## 2022-07-20 MED ORDER — GADOPICLENOL 0.5 MMOL/ML IV SOLN
10.0000 mL | Freq: Once | INTRAVENOUS | Status: AC | PRN
  Administered 2022-07-20: 10 mL via INTRAVENOUS

## 2022-07-25 LAB — HM DIABETES EYE EXAM

## 2022-07-29 NOTE — Telephone Encounter (Signed)
Called and spoke with patient regarding Enty[employer] PAP. Pt reports a rep from [employer] spoke with him last week and he has already been approved for next year (2024). Nothing further needed.

## 2022-08-03 ENCOUNTER — Other Ambulatory Visit: Payer: Self-pay | Admitting: Cardiovascular Disease

## 2022-08-03 ENCOUNTER — Other Ambulatory Visit: Payer: HMO

## 2022-08-11 ENCOUNTER — Other Ambulatory Visit: Payer: Self-pay | Admitting: Oncology

## 2022-08-13 ENCOUNTER — Other Ambulatory Visit: Payer: Self-pay

## 2022-08-13 ENCOUNTER — Telehealth: Payer: Self-pay

## 2022-08-13 DIAGNOSIS — I10 Essential (primary) hypertension: Secondary | ICD-10-CM

## 2022-08-13 MED ORDER — APIXABAN 5 MG PO TABS: 5.0000 mg | ORAL_TABLET | Freq: Two times a day (BID) | ORAL | 3 refills | Status: DC

## 2022-08-13 NOTE — Telephone Encounter (Signed)
[facility] pt assistance application completed, signed and faxed with confirmation of successful fax received.

## 2022-08-14 ENCOUNTER — Other Ambulatory Visit: Payer: Self-pay

## 2022-08-14 ENCOUNTER — Inpatient Hospital Stay: Payer: PPO

## 2022-08-14 ENCOUNTER — Inpatient Hospital Stay: Payer: PPO | Admitting: Oncology

## 2022-08-14 ENCOUNTER — Inpatient Hospital Stay: Payer: PPO | Attending: Oncology

## 2022-08-14 ENCOUNTER — Encounter: Payer: Self-pay | Admitting: Oncology

## 2022-08-14 VITALS — BP 128/90 | HR 86 | Temp 98.1°F | Resp 18 | Ht 70.0 in | Wt 214.0 lb

## 2022-08-14 DIAGNOSIS — I129 Hypertensive chronic kidney disease with stage 1 through stage 4 chronic kidney disease, or unspecified chronic kidney disease: Secondary | ICD-10-CM | POA: Insufficient documentation

## 2022-08-14 DIAGNOSIS — I2510 Atherosclerotic heart disease of native coronary artery without angina pectoris: Secondary | ICD-10-CM | POA: Diagnosis not present

## 2022-08-14 DIAGNOSIS — I7389 Other specified peripheral vascular diseases: Secondary | ICD-10-CM | POA: Diagnosis not present

## 2022-08-14 DIAGNOSIS — K5190 Ulcerative colitis, unspecified, without complications: Secondary | ICD-10-CM | POA: Diagnosis not present

## 2022-08-14 DIAGNOSIS — E1122 Type 2 diabetes mellitus with diabetic chronic kidney disease: Secondary | ICD-10-CM | POA: Diagnosis not present

## 2022-08-14 DIAGNOSIS — I480 Paroxysmal atrial fibrillation: Secondary | ICD-10-CM | POA: Diagnosis not present

## 2022-08-14 DIAGNOSIS — N189 Chronic kidney disease, unspecified: Secondary | ICD-10-CM | POA: Diagnosis not present

## 2022-08-14 DIAGNOSIS — C220 Liver cell carcinoma: Secondary | ICD-10-CM

## 2022-08-14 LAB — CMP (CANCER CENTER ONLY)
ALT: 14 U/L (ref 10–47)
AST: 31 U/L (ref 11–38)
Albumin: 4 g/dL (ref 3.5–5.0)
Alkaline Phosphatase: 61 U/L (ref 38–126)
Anion gap: 10 (ref 5–15)
BUN: 25 mg/dL — ABNORMAL HIGH (ref 8–23)
CO2: 24 mmol/L (ref 22–32)
Calcium: 9.3 mg/dL (ref 8.9–10.3)
Chloride: 106 mmol/L (ref 98–111)
Creatinine: 1.58 mg/dL — ABNORMAL HIGH (ref 0.60–1.20)
GFR, Estimated: 48 mL/min — ABNORMAL LOW (ref >60)
Glucose, Bld: 172 mg/dL — ABNORMAL HIGH (ref 70–99)
Potassium: 4.1 mmol/L (ref 3.5–5.1)
Sodium: 140 mmol/L (ref 135–145)
Total Bilirubin: 1.1 mg/dL (ref 0.2–1.6)
Total Protein: 7.4 g/dL (ref 6.5–8.1)

## 2022-08-14 LAB — CBC WITH DIFFERENTIAL (CANCER CENTER ONLY)
Abs Immature Granulocytes: 0.01 10*3/uL (ref 0.00–0.07)
Basophils Absolute: 0 10*3/uL (ref 0.0–0.1)
Basophils Relative: 1 %
Eosinophils Absolute: 0.2 10*3/uL (ref 0.0–0.5)
Eosinophils Relative: 3 %
HCT: 41.1 % (ref 39.0–52.0)
Hemoglobin: 12.5 g/dL — ABNORMAL LOW (ref 13.0–17.0)
Immature Granulocytes: 0 %
Lymphocytes Relative: 12 %
Lymphs Abs: 0.9 10*3/uL (ref 0.7–4.0)
MCH: 24.4 pg — ABNORMAL LOW (ref 26.0–34.0)
MCHC: 30.4 g/dL (ref 30.0–36.0)
MCV: 80.1 fL (ref 80.0–100.0)
Monocytes Absolute: 1 10*3/uL (ref 0.1–1.0)
Monocytes Relative: 14 %
Neutro Abs: 5 10*3/uL (ref 1.7–7.7)
Neutrophils Relative %: 70 %
Platelet Count: 269 10*3/uL (ref 150–400)
RBC: 5.13 MIL/uL (ref 4.22–5.81)
RDW: 21.3 % — ABNORMAL HIGH (ref 11.5–15.5)
WBC Count: 7.2 10*3/uL (ref 4.0–10.5)
nRBC: 0 % (ref 0.0–0.2)

## 2022-08-14 LAB — TOTAL PROTEIN, URINE DIPSTICK: Protein, ur: NEGATIVE mg/dL

## 2022-08-14 LAB — TSH: TSH: 0.996 u[IU]/mL (ref 0.350–4.500)

## 2022-08-14 NOTE — Progress Notes (Signed)
[facility] OFFICE PROGRESS NOTE   Diagnosis: Hepatocellular carcinoma  INTERVAL HISTORY:   Matthew Chen returns as scheduled.  No complaint.  He completed another cycle of atezolizumab/bevacizumab on 07/15/2022.  Objective:  Vital signs in last 24 hours:  Blood pressure (!) 128/90, pulse 86, temperature 98.1 F (36.7 C), temperature source Oral, resp. rate 18, height '5\' 10"'$  (1.778 m), weight 214 lb (97.1 kg), SpO2 98 %.    Resp: Breath sounds, no respiratory distress Cardio: Irregular GI: No hepatosplenomegaly Vascular: No leg edema    Lab Results:  Lab Results  Component Value Date   WBC 7.2 08/14/2022   HGB 12.5 (L) 08/14/2022   HCT 41.1 08/14/2022   MCV 80.1 08/14/2022   PLT 269 08/14/2022   NEUTROABS 5.0 08/14/2022    CMP  Lab Results  Component Value Date   NA 140 08/14/2022   K 4.1 08/14/2022   CL 106 08/14/2022   CO2 24 08/14/2022   GLUCOSE 172 (H) 08/14/2022   BUN 25 (H) 08/14/2022   CREATININE 1.58 (H) 08/14/2022   CALCIUM 9.3 08/14/2022   PROT 7.4 08/14/2022   ALBUMIN 4.0 08/14/2022   AST 31 08/14/2022   ALT 14 08/14/2022   ALKPHOS 61 08/14/2022   BILITOT 1.1 08/14/2022   GFRNONAA 48 (L) 08/14/2022   GFRAA 49 (L) 11/20/2020    Lab Results  Component Value Date   CEA1 1.9 06/21/2021     Medications: I have reviewed the patient's current medications.   Assessment/Plan: Hepatocellular carcinoma CT angio chest aorta 01/30/2021-no evidence of thoracic aortic aneurysm, diffuse hepatic steatosis, suggestion of early cirrhosis, 7.1 cm enhancing masslike area in segment 5/6, prominent gastrohepatic and periportal nodes measuring up to 7 mm MRI liver 03/22/2021-arterial hyperenhancing subcapsular mass, segment 6, 7 x 5.1 cm, LI-RADS 5, no pathologically large lymph nodes Ultrasound-guided biopsy 04/24/2021-hepatocellular carcinoma Y90 radioembolization of segment 6 and segment 7 hepatic arterial branches 06/21/2021 MRI abdomen  09/03/2021-new increased enhancement inferior and superior to the dominant right liver lesion, new 11 mm enhancing lesion in the left liver LIRADS 3, dominant right hepatic lesion stable in size MRI abdomen 12/19/2021-unchanged hyperenhancing mass in the anterior inferior right liver measuring 7.8 x 5 cm, interval enlargement of a arterial enhancing lesion in segment 2- LIRADS 5, new small enhancing lesions hepatic segment 4A measuring 1.2 x 0.9 cm and 0.8 x 0.6 cm- LIRADS 5 Cycle 1 atezolizumab /bevacizumab 02/07/2022 Cycle 2 atezolizumab/bevacizumab 02/27/2022 Cycle 3 atezolizumab/bevacizumab 03/21/2022 Cycle 4 atezolizumab/bevacizumab 04/22/2022 MRI abdomen 05/05/2022-no change in dominant segment 6 lesion, no change in hyperenhancing lesions in segment 4A and segment 2, no new lesions Cycle 5 atezolizumab/bevacizumab 05/13/2022 Cycle 6 atezolizumab/bevacizumab 06/04/2022 Cycle 7 atezolizumab/bevacizumab 06/24/2022 Cycle 8 atezolizumab/bevacizumab 07/15/2022 MRI abdomen 07/20/2022-mild progression of bilateral liver lesions, no evidence of extrahepatic metastatic disease Ulcerative colitis-maintained on vedolizumab Peripheral arterial disease History of a thoracic aortic aneurysm CAD Diabetes Hypertension Chronic renal failure Paroxysmal atrial fibrillation    Disposition: Matthew Chen appears stable.  I reviewed the MRI findings by telephone with him last month and again today.  I presented his case at the GI tumor conference earlier today.  The recent MRI images are difficult to compare due to motion and bolus timing, but there appears to be enlargement of multiple liver lesions.  I discussed treatment options with Matthew Chen I recommend discontinuing atezolizumab/bevacizumab.  Discussed standard second line treatment options including treatment with a tyrosine kinase inhibitor (I recommend lenvatinib), ipilimumab/nivolumab, and referral to [facility] for second  opinion and to consider clinical trial  options.  He agrees to a referral to [facility].  We reviewed potential toxicities associated with lenvatinib including the chance of diarrhea, rash, hypertension, proteinuria, bleeding, cardiac toxicity, and thromboembolic events.  Matthew Chen will return for an office visit in approximately 3 weeks.  Betsy Coder, MD  08/14/2022  1:21 PM

## 2022-08-14 NOTE — Progress Notes (Signed)
The proposed treatment discussed in conference is for discussion purpose only and is not a binding recommendation.  The patients have not been physically examined, or presented with their treatment options.  Therefore, final treatment plans cannot be decided.  

## 2022-08-15 ENCOUNTER — Encounter: Payer: Self-pay | Admitting: *Deleted

## 2022-08-15 LAB — T4: T4, Total: 6.4 ug/dL (ref 4.5–12.0)

## 2022-08-15 NOTE — Progress Notes (Signed)
PATIENT NAVIGATOR PROGRESS NOTE  Name: Matthew Chen Date: 08/15/2022 MRN: [id number]  DOB: 1955/12/23   Reason for visit:  Referral to Dr Shirleen Schirmer [facility]  Comments:  Urgent referral faxed to Dr Shirleen Schirmer at [facility] at [phone]    Time spent counseling/coordinating care: 30-45 minutes

## 2022-08-16 DIAGNOSIS — C220 Liver cell carcinoma: Secondary | ICD-10-CM | POA: Diagnosis not present

## 2022-08-19 DIAGNOSIS — C220 Liver cell carcinoma: Secondary | ICD-10-CM | POA: Diagnosis not present

## 2022-08-19 DIAGNOSIS — R160 Hepatomegaly, not elsewhere classified: Secondary | ICD-10-CM | POA: Diagnosis not present

## 2022-08-26 ENCOUNTER — Other Ambulatory Visit: Payer: Self-pay

## 2022-08-26 ENCOUNTER — Telehealth: Payer: Self-pay | Admitting: Family Medicine

## 2022-08-26 DIAGNOSIS — E1169 Type 2 diabetes mellitus with other specified complication: Secondary | ICD-10-CM

## 2022-08-26 MED ORDER — PIOGLITAZONE HCL 30 MG PO TABS: ORAL_TABLET | ORAL | 1 refills | Status: DC

## 2022-08-26 NOTE — Telephone Encounter (Signed)
Prescription Request  08/26/2022  Is this a "Controlled Substance" medicine? No  LOV: 05/30/2022  What is the name of the medication or equipment?   pioglitazone (ACTOS) 30 MG tablet   Have you contacted your pharmacy to request a refill? Yes   Which pharmacy would you like this sent to?  [id number]: [phone]: [phone]   Patient notified that their request is being sent to the clinical staff for review and that they should receive a response within 2 business days.   Please advise pharmacist at [phone]

## 2022-08-30 ENCOUNTER — Encounter: Payer: Self-pay | Admitting: Family Medicine

## 2022-08-30 DIAGNOSIS — D6869 Other thrombophilia: Secondary | ICD-10-CM | POA: Diagnosis not present

## 2022-08-30 DIAGNOSIS — Z1331 Encounter for screening for depression: Secondary | ICD-10-CM | POA: Diagnosis not present

## 2022-08-30 DIAGNOSIS — C220 Liver cell carcinoma: Secondary | ICD-10-CM | POA: Diagnosis not present

## 2022-08-30 DIAGNOSIS — I480 Paroxysmal atrial fibrillation: Secondary | ICD-10-CM | POA: Diagnosis not present

## 2022-08-31 ENCOUNTER — Other Ambulatory Visit: Payer: Self-pay | Admitting: Family Medicine

## 2022-08-31 DIAGNOSIS — I10 Essential (primary) hypertension: Secondary | ICD-10-CM

## 2022-09-01 ENCOUNTER — Other Ambulatory Visit: Payer: Self-pay | Admitting: Family Medicine

## 2022-09-01 DIAGNOSIS — E1169 Type 2 diabetes mellitus with other specified complication: Secondary | ICD-10-CM

## 2022-09-02 ENCOUNTER — Inpatient Hospital Stay: Payer: PPO | Admitting: Oncology

## 2022-09-02 ENCOUNTER — Telehealth: Payer: Self-pay

## 2022-09-02 ENCOUNTER — Other Ambulatory Visit: Payer: Self-pay

## 2022-09-02 VITALS — BP 129/73 | HR 85 | Temp 98.1°F | Resp 18 | Ht 70.0 in | Wt 211.4 lb

## 2022-09-02 DIAGNOSIS — C220 Liver cell carcinoma: Secondary | ICD-10-CM | POA: Diagnosis not present

## 2022-09-02 MED ORDER — TRULICITY 3 MG/0.5ML ~~LOC~~ SOAJ: 3.0000 mg | SUBCUTANEOUS | 1 refills | Status: DC

## 2022-09-02 NOTE — Telephone Encounter (Signed)
Spoke w/pt re: Trulicity. Per pcp Call out 3 mg sq weekly of trulicity and tell him we are increasing dose to help with weight loss if he can tolerate it   Pt voiced understanding and nothing further.

## 2022-09-02 NOTE — Telephone Encounter (Signed)
Pt called today stating that the pharmacy does not have Trulicity 1.'5mg'$ ?  Please advice?

## 2022-09-02 NOTE — Telephone Encounter (Signed)
Pls advice  

## 2022-09-02 NOTE — Progress Notes (Addendum)
Matthew OFFICE PROGRESS NOTE   Diagnosis: Hepatocellular Matthew Chen  INTERVAL HISTORY:   Matthew Matthew Chen returns as scheduled.  He is here with his wife and son.  He generally feels well.  He reports mild discomfort in the lower abdomen. He saw Dr. Altamease Oiler on 08/16/2022.  He had a repeat MRI at [facility] on 08/19/2022. Objective:  Vital signs in last 24 hours:  Blood pressure 129/73, pulse 85, temperature 98.1 F (36.7 C), temperature source Oral, resp. rate 18, height '5\' 10"'$  (1.778 m), weight 211 lb 6.4 oz (95.9 kg), SpO2 98 %.     Resp: Lungs clear bilaterally Cardio: Irregular GI: Nontender, no hepatosplenomegaly, no mass Vascular: No leg edema   Lab Results:  Lab Results  Component Value Date   WBC 7.2 08/14/2022   HGB 12.5 (L) 08/14/2022   HCT 41.1 08/14/2022   MCV 80.1 08/14/2022   PLT 269 08/14/2022   NEUTROABS 5.0 08/14/2022    CMP  Lab Results  Component Value Date   NA 140 08/14/2022   K 4.1 08/14/2022   CL 106 08/14/2022   CO2 24 08/14/2022   GLUCOSE 172 (H) 08/14/2022   BUN 25 (H) 08/14/2022   CREATININE 1.58 (H) 08/14/2022   CALCIUM 9.3 08/14/2022   PROT 7.4 08/14/2022   ALBUMIN 4.0 08/14/2022   AST 31 08/14/2022   ALT 14 08/14/2022   ALKPHOS 61 08/14/2022   BILITOT 1.1 08/14/2022   GFRNONAA 48 (L) 08/14/2022   GFRAA 49 (L) 11/20/2020    Lab Results  Component Value Date   CEA1 1.9 06/21/2021    Lab Results  Component Value Date   INR 1.2 (H) 12/13/2021   LABPROT 12.3 (H) 12/13/2021    Imaging:  No results found.  Medications: I have reviewed the patient's current medications.   Assessment/Plan: Hepatocellular Matthew Chen CT angio chest aorta 01/30/2021-no evidence of thoracic aortic aneurysm, diffuse hepatic steatosis, suggestion of early cirrhosis, 7.1 cm enhancing masslike area in segment 5/6, prominent gastrohepatic and periportal nodes measuring up to 7 mm MRI liver 03/22/2021-arterial hyperenhancing subcapsular mass,  segment 6, 7 x 5.1 cm, LI-RADS 5, no pathologically large lymph nodes Ultrasound-guided biopsy 04/24/2021-hepatocellular Matthew Chen Y90 radioembolization of segment 6 and segment 7 hepatic arterial branches 06/21/2021 MRI abdomen 09/03/2021-new increased enhancement inferior and superior to the dominant right liver lesion, new 11 mm enhancing lesion in the left liver LIRADS 3, dominant right hepatic lesion stable in size MRI abdomen 12/19/2021-unchanged hyperenhancing mass in the anterior inferior right liver measuring 7.8 x 5 cm, interval enlargement of a arterial enhancing lesion in segment 2- LIRADS 5, new small enhancing lesions hepatic segment 4A measuring 1.2 x 0.9 cm and 0.8 x 0.6 cm- LIRADS 5 Cycle 1 atezolizumab /bevacizumab 02/07/2022 Cycle 2 atezolizumab/bevacizumab 02/27/2022 Cycle 3 atezolizumab/bevacizumab 03/21/2022 Cycle 4 atezolizumab/bevacizumab 04/22/2022 MRI abdomen 05/05/2022-no change in dominant segment 6 lesion, no change in hyperenhancing lesions in segment 4A and segment 2, no new lesions Cycle 5 atezolizumab/bevacizumab 05/13/2022 Cycle 6 atezolizumab/bevacizumab 06/04/2022 Cycle 7 atezolizumab/bevacizumab 06/24/2022 Cycle 8 atezolizumab/bevacizumab 07/15/2022 MRI abdomen 07/20/2022-mild progression of bilateral liver lesions, no evidence of extrahepatic metastatic disease MRI [facility] 08/19/2022-increased size of the dominant admitted 5/6 lesion, now measuring 12.7 x 8.8 cm, multiple additional hepatic lesions appear larger than on the previous exam, stable mildly enlarged upper abdominal lymph nodes Ulcerative colitis-maintained on vedolizumab Peripheral arterial disease History of a thoracic aortic aneurysm CAD Diabetes Hypertension Chronic renal failure Paroxysmal atrial fibrillation      Disposition: Mr.  Matthew Chen appears stable.  He has progressive hepatocellular Matthew Chen following 90 embolization of the right liver and atezolizumab/bevacizumab therapy.  I discussed  treatment options with him.  My initial impression is to recommend lenvatinib.  We reviewed potential toxicities associated with lenvatinib including the chance of hematologic toxicity, rash, diarrhea, bleeding, thromboembolic disease, hypertension, and nephrotoxicity.  He will receive additional teaching from the cancer center pharmacy.  I will contact Dr. Altamease Oiler for her recommendation.  We also discussed treatment with hepatic directed therapy and ipilimumab/nivolumab.  The plan is to begin lenvatinib in approximately 1 week.  He will return for an office and lab visit in 3 weeks.  Addendum: Dr. Altamease Oiler recommends changing treatment to lenvatinib.  Betsy Coder, MD  09/02/2022  8:59 AM

## 2022-09-03 ENCOUNTER — Telehealth: Payer: Self-pay | Admitting: *Deleted

## 2022-09-03 ENCOUNTER — Telehealth: Payer: Self-pay

## 2022-09-03 ENCOUNTER — Telehealth: Payer: Self-pay | Admitting: Pharmacist

## 2022-09-03 ENCOUNTER — Other Ambulatory Visit (HOSPITAL_COMMUNITY): Payer: Self-pay

## 2022-09-03 MED ORDER — LENVATINIB (12 MG DAILY DOSE) 3 X 4 MG PO CPPK
12.0000 mg | ORAL_CAPSULE | Freq: Every day | ORAL | 0 refills | Status: DC
  Filled 2022-09-03 – 2022-09-05 (×2): qty 90, 30d supply, fill #0

## 2022-09-03 NOTE — Telephone Encounter (Signed)
Informed Matthew Chen that Dr. Altamease Oiler agrees to start lenvatinib 12 mg daily. Script has been sent and oral oncology team is working on it. Hope to start on 09/09/22. They are working on copay assist grant currently. He should expect call from pharmacist soon to review the medication.

## 2022-09-03 NOTE — Telephone Encounter (Signed)
Oral Oncology Patient Advocate Encounter  Prior Authorization for [place] has been approved.    PA# [id number]  Effective dates: 09/03/22 through 09/03/23  Patients co-pay is $2,847.11.    Berdine Addison, [facility] Patient [place]  [phone] (phone) [phone] (fax) 09/03/2022 12:21 PM

## 2022-09-03 NOTE — Telephone Encounter (Signed)
Oral Oncology Pharmacist Encounter  Received new prescription for Lenvima (lenvatinib) for the treatment of progressive [place], planned duration until disease progression or unacceptable drug toxicity. Planned start 09/09/22  CMP and TSH from 08/14/22 assessed, no relevant lab abnormalities. Urine protein lab order entered. Prescription dose and frequency assessed.   Current medication list in Epic reviewed, no relevant DDIs with lenvatinib identified.  Evaluated chart and no patient barriers to medication adherence identified.   Prescription has been e-scribed to the [facility] for benefits analysis and approval.  Oral Oncology Clinic will continue to follow for insurance authorization, copayment issues, initial counseling and start date.   Darl Pikes, PharmD, BCPS, BCOP, CPP Hematology/Oncology Clinical Pharmacist Practitioner [place]/DB/AP Oral [place] [facility] [phone]  09/03/2022 12:01 PM

## 2022-09-03 NOTE — Telephone Encounter (Signed)
Oral Oncology Patient Advocate Encounter   Received notification that prior authorization for Lenvima is required.   PA submitted on 09/03/22  Key [id number]  Status is pending     Berdine Addison, [place] Patient [place]  [phone] (phone) [phone] (fax) 09/03/2022 12:02 PM

## 2022-09-03 NOTE — Telephone Encounter (Signed)
Oral Oncology Patient Advocate Encounter   Was successful in securing patient a $ 6,000 grant from [employer] to provide copayment coverage for Lenvima.  The patient's out of pocket cost will be $5.00 monthly.     I have spoken with the patient.    The billing information is as follows and has been shared with [place].   Member ID: [id number] Group ID: [id number] RxBin: [id number] Dates of Eligibility: 09/03/22 through 08/12/23   Matthew Chen, [place] Patient [place]  [phone] (phone) [phone] (fax) 09/03/2022 11:41 AM

## 2022-09-03 NOTE — Telephone Encounter (Signed)
Oral Oncology Patient Advocate Encounter   Was successful in securing patient a $7,500 grant from [employer] to provide copayment coverage for [place].  This will keep the out of pocket expense at $0.     I have spoken with the patient.    The billing information is as follows and has been shared with [place].   Member ID: [id number] Group ID: [id number] RxBin: [id number] PCN: [id number] Dates of Eligibility: 08/20/22 through 08/21/23  Fund name:  Hepatocellular Carcinoma.   Berdine Addison, [place] [facility] Patient [place]  [phone] (phone) [phone] (fax) 09/03/2022 11:29 AM

## 2022-09-03 NOTE — Addendum Note (Signed)
Addended by: Betsy Coder B on: 09/03/2022 08:23 AM   Modules accepted: Orders

## 2022-09-05 ENCOUNTER — Other Ambulatory Visit (HOSPITAL_COMMUNITY): Payer: Self-pay

## 2022-09-05 ENCOUNTER — Other Ambulatory Visit: Payer: Self-pay

## 2022-09-05 NOTE — Telephone Encounter (Signed)
Oral Chemotherapy Pharmacist Encounter  Patient Education I spoke with patient for overview of new oral chemotherapy medication: Lenvima (lenvatinib) for the treatment of progressive [place], planned duration until disease progression or unacceptable drug toxicity.   Pt is doing well. Counseled patient on administration, dosing, side effects, monitoring, drug-food interactions, safe handling, storage, and disposal. Patient will take 3 tablets ('12mg'$ ) by mouth daily.  Side effects include but not limited to:  - increased blood pressure - voice changes - HSFR - mouth irritation - diarrhea - nausea and vomiting - muscle pain  Reviewed with patient importance of keeping a medication schedule and plan for any missed doses.  After discussion with patient there are no patient barriers to medication adherence identified.   Mr. Knobel voiced understanding and appreciation. All questions answered. Medication handout provided.  Provided patient with Oral [place] [facility] phone number. Patient knows to call the office with questions or concerns. Oral Chemotherapy Navigation Clinic will continue to follow.  Laray Anger, PharmD PGY-2 Pharmacy Resident Hematology/Oncology [phone]  09/05/2022 12:49 PM

## 2022-09-06 ENCOUNTER — Other Ambulatory Visit: Payer: Self-pay | Admitting: Family Medicine

## 2022-09-06 DIAGNOSIS — E1169 Type 2 diabetes mellitus with other specified complication: Secondary | ICD-10-CM

## 2022-09-10 ENCOUNTER — Ambulatory Visit: Payer: PPO | Attending: Cardiovascular Disease | Admitting: Cardiovascular Disease

## 2022-09-10 ENCOUNTER — Encounter: Payer: Self-pay | Admitting: Cardiovascular Disease

## 2022-09-10 VITALS — BP 114/72 | HR 100 | Ht 70.0 in | Wt 210.6 lb

## 2022-09-10 DIAGNOSIS — I7121 Aneurysm of the ascending aorta, without rupture: Secondary | ICD-10-CM

## 2022-09-10 DIAGNOSIS — I739 Peripheral vascular disease, unspecified: Secondary | ICD-10-CM

## 2022-09-10 DIAGNOSIS — Q231 Congenital insufficiency of aortic valve: Secondary | ICD-10-CM | POA: Diagnosis not present

## 2022-09-10 DIAGNOSIS — I480 Paroxysmal atrial fibrillation: Secondary | ICD-10-CM

## 2022-09-10 DIAGNOSIS — E782 Mixed hyperlipidemia: Secondary | ICD-10-CM | POA: Diagnosis not present

## 2022-09-10 DIAGNOSIS — I10 Essential (primary) hypertension: Secondary | ICD-10-CM

## 2022-09-10 DIAGNOSIS — I2510 Atherosclerotic heart disease of native coronary artery without angina pectoris: Secondary | ICD-10-CM

## 2022-09-10 NOTE — Assessment & Plan Note (Signed)
History of cancer blood pressure measured today 114/72.  He is on lisinopril and carvedilol.

## 2022-09-10 NOTE — Assessment & Plan Note (Signed)
History of hyperlipidemia on statin therapy with lipid profile performed 05/27/2022 revealing total cholesterol 110, LDL 49 and HDL 36.

## 2022-09-10 NOTE — Patient Instructions (Addendum)
Medication Instructions:  Your physician recommends that you continue on your current medications as directed. Please refer to the Current Medication list given to you today.  *If you need a refill on your cardiac medications before your next appointment, please call your pharmacy*   Testing/Procedures: Your physician has requested that you have a lower extremity arterial duplex. During this test, ultrasound is used to evaluate arterial blood flow in the legs. Allow one hour for this exam. There are no restrictions or special instructions. This will take place at [place], [address]. To be done in May.  Your physician has requested that you have an ankle brachial index (ABI). During this test an ultrasound and blood pressure cuff are used to evaluate the arteries that supply the arms and legs with blood. Allow thirty minutes for this exam. There are no restrictions or special instructions. This will take place at [place], [address]. To be done in May.   Your physician has requested that you have an echocardiogram. Echocardiography is a painless test that uses sound waves to create images of your heart. It provides your doctor with information about the size and shape of your heart and how well your heart's chambers and valves are working. This procedure takes approximately one hour. There are no restrictions for this procedure. Please do NOT wear cologne, perfume, aftershave, or lotions (deodorant is allowed). Please arrive 15 minutes prior to your appointment time. This procedure will be done at [address]. To be done in August.    Follow-Up: At [facility], you and your health needs are our priority.  As part of our continuing mission to provide you with exceptional heart care, we have created designated Provider Care Teams.  These Care Teams include your primary Cardiologist (physician) and Advanced Practice Providers (APPs -  Physician Assistants  and Nurse Practitioners) who all work together to provide you with the care you need, when you need it.  We recommend signing up for the patient portal called "MyChart".  Sign up information is provided on this After Visit Summary.  MyChart is used to connect with patients for Virtual Visits (Telemedicine).  Patients are able to view lab/test results, encounter notes, upcoming appointments, etc.  Non-urgent messages can be sent to your provider as well.   To learn more about what you can do with MyChart, go to [email or web address].    Your next appointment:   6 month(s)  Provider:   Almyra Deforest, [name]      Then, Quay Burow, MD will plan to see you again in 12 month(s).

## 2022-09-10 NOTE — Assessment & Plan Note (Signed)
History of CAD status post attempt at orbital atherectomy of a highly calcified ostial large first diagonal branch stenosis which took off at a 90 degree angle which unfortunately resulted in perforation but not tamponade.  Patient was discharged home 2 days later.  He denies chest pain.

## 2022-09-10 NOTE — Assessment & Plan Note (Signed)
History of a small ascending thoracic aortic aneurysm measuring 41 mm by 2D echo performed 03/18/2022.  This will be repeated on an annual basis.

## 2022-09-10 NOTE — Assessment & Plan Note (Addendum)
History of peripheral arterial disease with multiple interventions in the past beginning in 2016 when he had left SFA intervention for staged right popliteal artery PTA.  I performed distal right SFA orbital atherectomy followed by [facility] [place]  stenting September 2019.  At that time he only had a 30 to 40% mid left SFA stenosis.  He has three-vessel runoff bilaterally.  His most recent Dopplers performed 12/25/2021 revealed a right ABI of 0.84 and left of 0.99 with what appears to be an occluded distal right SFA stent.  He denies claudication.  At this point I recommend conservative therapy.

## 2022-09-10 NOTE — Assessment & Plan Note (Signed)
History of PAF on Eliquis oral anticoagulation.  When he last underwent DC cardioversion he converted to sinus rhythm prior.  He is currently in A-fib with ventricular sponsor of 100.  He does complain of continued shortness of breath.  This may be contributory.  I am referring him to the A-fib clinic for further evaluation and treatment.

## 2022-09-10 NOTE — Progress Notes (Signed)
09/10/2022 Matthew Chen   11-02-1955  [id number]  Primary Physician Pickard, Matthew Mcgee, MD Primary Cardiologist: Lorretta Harp MD Matthew Chen, [place], [place]  HPI:  Matthew Chen is a 67 y.o.   mild to moderately overweight married Caucasian male father of 4 children, grandfather of a grandchildren was referred by Dr. Skeet Chen for evaluation and treatment of claudication. I last saw him in the office   01/03/2022.  He is accompanied by his wife Matthew Chen today.  He  has a history of treated hypertension, diabetes and hyperlipidemia. 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 He's never had a heart attack or stroke and denies chest pain or shortness of breath. He does complain of lifestyle limiting claudication and had arterial Doppler studies performed in our office 05/29/15 revealing high-grade right popliteal and distal left SFA disease. Based on this, we decided to proceed with angiography and potential percutaneous revascularization. I initially angiogrammed him on 06/29/15 and performed diamondback orbital rotational atherectomy, drug eluting balloon angioplasty of the distal left SFA for a high-grade, subocclusive calcific/exophytic plaque. He had a high-grade calcific/exophytic subocclusive right popliteal artery plaque for which he underwent diamondback orbital rotation arthrectomy, drug eluding balloon angioplasty on 07/13/15 with an excellent angiographic result. Since that time his claudication has resolved. His Dopplers performed  03/17/2018 revealed normal ABIs bilaterally with the development of a high-frequency signal in his right popliteal artery.  Since I saw him 8 months ago he has developed lifestyle limiting right calf claudication.   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  He had 30 to 40% mid left SFA stenosis.  I performed diamondback orbital rotational  atherectomy, PTA and stenting using a Tigris 7 mm x 40 mm long self-expanding stent.  His Dopplers normalized and his claudication has resolved.    His most recent Dopplers performed 11/13/2020 revealed normal ABIs bilaterally with a patent right SFA stent.  When I saw him 3 weeks ago he was complaining of exertional chest pain and dyspnea over the last several months.  I did obtain a 2D echo that was unremarkable with an EF of 60 to 65% with mild MR and mild AAS.  He did have a 46 mm ascending thoracic aortic aneurysm.  Myoview stress test was nonischemic.  A chest CT did show a 7.1 cm mass in his liver and a MRI was suggested.  He is wearing a event monitor currently.  Based on his symptoms we decided to proceed with outpatient diagnostic coronary angiography.  I performed coronary angiography on him 02/08/2021 revealing a high-grade subtotally occluded ostial calcified first diagonal branch stenosis.  Because of chronic renal insufficiency I elected to stage his intervention for 1 week later but did begin him on Plavix.  On 02/15/2021 I attempted to intervene on his diagonal branch but unfortunately this was complicated by coronary perforation using a "Fielder XT wire".  Luckily, he did not develop a pericardial effusion and this was quickly addressed during the procedure.  He was discharged home 2 days later.  He was found to have PAF during the hospitalization which was confirmed by event monitoring subsequent to that.  Since beginning him on antianginal medications his effort angina has significantly improved.  Since I saw him 5 months ago he continues to do well.  He did see Matthew Deforest [name] n the office in April complaining  of weakness and shortness of breath.  He was found to be in atrial fibrillation.  He was started on Eliquis with the intent to perform outpatient DC cardioversion.  When he presented to the hospital for Dr. Stanford Breed he was found to be in sinus rhythm and was sent home.  He does feel  somewhat clinically improved.  He had recent lower extremity arterial Doppler studies performed 12/25/2021 revealed a right ABI of 0.84, left 0.99 with what appears to be an occluded distal right SFA/popliteal artery.  He is only minimally symptomatic however.     Active Medications      Current Meds  Medication Sig   acetaminophen (TYLENOL) 500 MG tablet Take 1,000 mg by mouth every 6 (six) hours as needed for moderate pain or headache.   apixaban (ELIQUIS) 5 MG TABS tablet Take 1 tablet (5 mg total) by mouth 2 (two) times daily. PT NEEDS OV BEFORE ANY FUTURE REFILLS   aspirin EC 81 MG tablet Take 1 tablet (81 mg total) by mouth daily.   atorvastatin (LIPITOR) 80 MG tablet TAKE 1 TABLET BY MOUTH EVERY DAY   B-D UF III MINI PEN NEEDLES 31G X 5 MM MISC USE DAILY WITH VICTOZA   carvedilol (COREG) 12.5 MG tablet Take 3 tablets (37.5 mg total) by mouth 2 (two) times daily.   empagliflozin (JARDIANCE) 25 MG TABS tablet Take 1 tablet (25 mg total) by mouth daily before breakfast.   fenofibrate 160 MG tablet TAKE 1 TABLET BY MOUTH EVERY DAY   glipiZIDE (GLUCOTROL XL) 5 MG 24 hr tablet TAKE 1 TABLET BY MOUTH EVERY DAY WITH BREAKFAST   glucose blood (ONETOUCH ULTRA) test strip CHECK BLOOD SUGAR TWICE A DAY   liraglutide (VICTOZA) 18 MG/3ML SOPN INJECT 1.2 MG UNDER THE SKIN ONCE DAILY   lisinopril-hydrochlorothiazide (ZESTORETIC) 20-25 MG tablet Take 1 tablet by mouth daily.   loperamide (IMODIUM A-D) 2 MG tablet Take 2 mg by mouth 4 (four) times daily as needed for diarrhea or loose stools.   Omega-3 Fatty Acids (FISH OIL) 1000 MG CAPS Take 1,000 mg by mouth 2 (two) times daily.   pantoprazole (PROTONIX) 40 MG tablet Take 1 tablet (40 mg total) by mouth 2 (two) times daily.   pioglitazone (ACTOS) 30 MG tablet TAKE 1 TABLET BY MOUTH EVERY DAY STOP TRADJENTA   triamcinolone cream (KENALOG) 0.1 % Apply 1 application. topically 2 (two) times daily. For 4 weeks   vedolizumab (ENTYVIO) 300 MG injection  Inject 300 mg as directed See admin instructions. Every 2 months             Allergies  Allergen Reactions   Penicillins Other (See Comments)      Unsure, childhood reaction Has patient had a PCN reaction causing immediate rash, facial/tongue/throat swelling, SOB or lightheadedness with hypotension: Unknown Has patient had a PCN reaction causing severe rash involving mucus membranes or skin necrosis: Unknown Has patient had a PCN reaction that required hospitalization: No Has patient had a PCN reaction occurring within the last 10 years: No If all of the above answers are "NO", then may proceed with Cephalosporin use.        Social History         Socioeconomic History   Marital status: Married      Spouse name: Caliph Borowiak   Number of children: 2   Years of education: Not on file   Highest education level: Not on file  Occupational History   Occupation: Truck driver  Employer: [employer]  Tobacco Use   Smoking status: Former      Packs/day: 1.50      Years: 25.00      Pack years: 37.50      Types: Cigarettes      Quit date: 08/12/1996      Years since quitting: 25.4   Smokeless tobacco: Never  Vaping Use   Vaping Use: Never used  Substance and Sexual Activity   Alcohol use: Yes      Alcohol/week: 1.0 standard drink      Types: 1 Cans of beer per week      Comment: social use   Drug use: No   Sexual activity: Yes  Other Topics Concern   Not on file  Social History Narrative    ** Merged History Encounter **         Social Determinants of Health       Financial Resource Strain: Low Risk    Difficulty of Paying Living Expenses: Not very hard  Food Insecurity: Not on file  Transportation Needs: Not on file  Physical Activity: Not on file  Stress: Not on file  Social Connections: Not on file  Intimate Partner Violence: Not on file      Review of Systems: General: negative for chills, fever, night sweats or weight changes.   Cardiovascular: negative for chest pain, dyspnea on exertion, edema, orthopnea, palpitations, paroxysmal nocturnal dyspnea or shortness of breath Dermatological: negative for rash Respiratory: negative for cough or wheezing Urologic: negative for hematuria Abdominal: negative for nausea, vomiting, diarrhea, bright red blood per rectum, melena, or hematemesis Neurologic: negative for visual changes, syncope, or dizziness All other systems reviewed and are otherwise negative except as noted above.       Blood pressure 118/70, pulse 65, height '5\' 10"'$  (1.778 m), weight 234 lb 12.8 oz (106.5 kg), SpO2 97 %.  General appearance: alert and no distress Neck: no adenopathy, no carotid bruit, no JVD, supple, symmetrical, trachea midline, and thyroid not enlarged, symmetric, no tenderness/mass/nodules Lungs: clear to auscultation bilaterally Heart: regular rate and rhythm, S1, S2 normal, no murmur, click, rub or gallop Extremities: extremities normal, atraumatic, no cyanosis or edema Pulses: Diminished pedal pulses Skin: Skin color, texture, turgor normal. No rashes or lesions Neurologic: Grossly normal   EKG not performed today   ASSESSMENT AND PLAN:    Hypertension History of essential hypertension her blood pressure measured today at 118/70.  He is on carvedilol, lisinopril, hydrochlorothiazide and amlodipine.   Peripheral arterial disease ([place]) History of peripheral arterial disease dating back to 2016 when I intervened on his left SFA.  He had staged right popliteal intervention 12/16 and again in September 2019 at which time I placed a Tigressstent in his popliteal artery.  His most recent Doppler studies performed 12/25/2021 revealed a right ABI of 0.84, left of 0.99 with what appears to be an occluded Tigress stent.  He has mild right Calf claudication which is not lifestyle limiting.  At this point, we will continue to monitor him noninvasively on annual basis.   Hyperlipidemia History  of hyperlipidemia on high-dose atorvastatin and fenofibrate with lipid profile performed 03/02/2021 revealing total cholesterol 133, LDL 68 and HDL 30.   Bicuspid aortic valve History of bicuspid aortic valve with mild aortic stenosis by 2D echocardiogram performed 03/13/2021.  This will be repeated this August.   Aortic aneurysm, thoracic History of mild dilatation of the ascending thoracic aorta measuring 40 mm at the time  of echo in August of last year.  This will be repeated this coming August.   Paroxysmal atrial fibrillation ([place]) History of PAF maintaining sinus rhythm now on Eliquis oral anticoagulation.  He was scheduled for DC cardioversion with Dr. Stanford Breed but converted spontaneously.  He does feel somewhat clinically improved.   CAD (coronary artery disease) History of CAD status post attempt at diagonal branch orbital atherectomy and stenting 02/08/2021.  This is complicated by a small coronary perforation with some mild contrast extravasation.  The procedure was abandoned.  He did not end up having pericardial tamponade.  He currently denies chest pain.         Lorretta Harp MD FACP,FACC,FAHA, [facility] 01/03/2022 9:27 AM          Current Meds  Medication Sig   acetaminophen (TYLENOL) 500 MG tablet Take 1 tablet (500 mg total) by mouth every 6 (six) hours as needed.   apixaban (ELIQUIS) 5 MG TABS tablet Take 1 tablet (5 mg total) by mouth 2 (two) times daily.   aspirin EC 81 MG tablet Take 1 tablet (81 mg total) by mouth daily.   atorvastatin (LIPITOR) 80 MG tablet TAKE 1 TABLET BY MOUTH EVERY DAY   carvedilol (COREG) 12.5 MG tablet TAKE 3 TABLETS (37.5 MG) BY MOUTH 2 TIMES DAILY   cyanocobalamin (VITAMIN B12) 1000 MCG tablet Take 1,000 mcg by mouth daily.   Dulaglutide (TRULICITY) 1.5 ML/4.6TK SOPN Inject 1.5 mg into the skin once a week.   Dulaglutide (TRULICITY) 3 PT/4.6FK SOPN Inject 3 mg as directed once a week.   fenofibrate 160 MG tablet Take 1 tablet (160 mg  total) by mouth daily.   glucose blood (ONETOUCH ULTRA) test strip CHECK BLOOD SUGAR TWICE A DAY   HYDROcodone-acetaminophen (NORCO) 7.5-325 MG tablet Take 1 tablet by mouth every 6 (six) hours as needed for moderate pain.   Insulin Pen Needle (B-D UF III MINI PEN NEEDLES) 31G X 5 MM MISC Use daily with Victoza   JARDIANCE 25 MG TABS tablet TAKE 1 TABLET BY MOUTH DAILY BEFORE BREAKFAST.   lenvatinib 12 mg daily dose (LENVIMA) 3 x 4 MG capsule Take 3 capsules (12 mg total) by mouth daily.   lisinopril (ZESTRIL) 20 MG tablet Take 1 tablet (20 mg total) by mouth daily.   loperamide (IMODIUM A-D) 2 MG tablet Take 2 mg by mouth 4 (four) times daily as needed for diarrhea or loose stools.   methocarbamol (ROBAXIN) 500 MG tablet Take 1 tablet (500 mg total) by mouth 2 (two) times daily.   Omega-3 Fatty Acids (FISH OIL) 1000 MG CAPS Take 1,000 mg by mouth 2 (two) times daily.   pantoprazole (PROTONIX) 40 MG tablet Take 1 tablet (40 mg total) by mouth 2 (two) times daily.   pioglitazone (ACTOS) 30 MG tablet TAKE 1 TABLET BY MOUTH EVERY DAY. STRENGTH: 30 MG   prochlorperazine (COMPAZINE) 10 MG tablet Take 1 tablet (10 mg total) by mouth every 6 (six) hours as needed for nausea.   sildenafil (VIAGRA) 100 MG tablet Take 0.5-1 tablets (50-100 mg total) by mouth daily as needed for erectile dysfunction.   vedolizumab (ENTYVIO) 300 MG injection Inject 300 mg as directed See admin instructions. Every 2 months     Allergies  Allergen Reactions   Penicillins Other (See Comments)    Unsure, childhood reaction Has patient had a PCN reaction causing immediate rash, facial/tongue/throat swelling, SOB or lightheadedness with hypotension: Unknown Has patient had a PCN reaction causing severe rash  involving mucus membranes or skin necrosis: Unknown Has patient had a PCN reaction that required hospitalization: No Has patient had a PCN reaction occurring within the last 10 years: No If all of the above answers are  "NO", then may proceed with Cephalosporin use.     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[employer], academic  Tobacco Use   Smoking status: Former    Packs/day: 1.50    Years: 25.00    Total pack years: 37.50    Types: Cigarettes    Quit date: 08/12/1996    Years since quitting: 26.0   Smokeless tobacco: Never  Vaping Use   Vaping Use: Never used  Substance and Sexual Activity   Alcohol use: Yes    Alcohol/week: 1.0 standard drink of alcohol    Types: 1 Cans of beer per week    Comment: social use   Drug use: No   Sexual activity: Yes  Other Topics Concern   Not on file  Social History Narrative   ** Merged History Encounter **       Social Determinants of Health   Financial Resource Strain: Low Risk  (08/17/2021)   Overall Financial Resource Strain (CARDIA)    Difficulty of Paying Living Expenses: Not very hard  Food Insecurity: Not on file  Transportation Needs: Not on file  Physical Activity: Not on file  Stress: Not on file  Social Connections: Not on file  Intimate Partner Violence: Not on file     Review of Systems: General: negative for chills, fever, night sweats or weight changes.  Cardiovascular: negative for chest pain, dyspnea on exertion, edema, orthopnea, palpitations, paroxysmal nocturnal dyspnea or shortness of breath Dermatological: negative for rash Respiratory: negative for cough or wheezing Urologic: negative for hematuria Abdominal: negative for nausea, vomiting, diarrhea, bright red blood per rectum, melena, or hematemesis Neurologic: negative for visual changes, syncope, or dizziness All other systems reviewed and are otherwise negative except as noted above.    Blood pressure 114/72, pulse 100, height '5\' 10"'$  (1.778  m), weight 210 lb 9.6 oz (95.5 kg), SpO2 97 %.  General appearance: alert and no distress Neck: no adenopathy, no carotid bruit, no JVD, supple, symmetrical, trachea midline, and thyroid not enlarged, symmetric, no tenderness/mass/nodules Lungs: clear to auscultation bilaterally Heart: irregularly irregular rhythm Extremities: extremities normal, atraumatic, no cyanosis or edema Pulses: 2+ and symmetric Diminished pedal pulses Skin: Skin color, texture, turgor normal. No rashes or lesions Neurologic: Grossly normal  EKG atrial fibrillation with ventricular sponsor 100.  I personally reviewed this EKG.  ASSESSMENT AND PLAN:   Hypertension History of cancer blood pressure measured today 114/72.  He is on lisinopril and carvedilol.  Peripheral arterial disease ([place]) History of peripheral arterial disease with multiple interventions in the past beginning in 2016 when he had left SFA intervention for staged right popliteal artery PTA.  I performed distal right SFA orbital atherectomy followed by [facility] and Tigris  stenting September 2019.  At that time he only had a 30 to 40% mid left SFA stenosis.  He has three-vessel runoff bilaterally.  His most recent Dopplers performed 12/25/2021 revealed a right ABI of 0.84 and left of 0.99 with what appears to be an occluded  distal right SFA stent.  He denies claudication.  At this point I recommend conservative therapy.  Hyperlipidemia History of hyperlipidemia on statin therapy with lipid profile performed 05/27/2022 revealing total cholesterol 110, LDL 49 and HDL 36.  Bicuspid aortic valve History of bicuspid aortic valve with only mild aortic stenosis by 2D echo performed 03/18/2022 with a mean gradient of 11 mmHg.  Aortic aneurysm, thoracic History of a small ascending thoracic aortic aneurysm measuring 41 mm by 2D echo performed 03/18/2022.  This will be repeated on an annual basis.  CAD (coronary artery disease) History of CAD status post attempt at  orbital atherectomy of a highly calcified ostial large first diagonal branch stenosis which took off at a 90 degree angle which unfortunately resulted in perforation but not tamponade.  Patient was discharged home 2 days later.  He denies chest pain.  Paroxysmal atrial fibrillation ([facility]) History of PAF on Eliquis oral anticoagulation.  When he last underwent DC cardioversion he converted to sinus rhythm prior.  He is currently in A-fib with ventricular sponsor of 100.  He does complain of continued shortness of breath.  This may be contributory.  I am referring him to the A-fib clinic for further evaluation and treatment.     Lorretta Harp MD FACP,FACC,FAHA, [facility] 09/10/2022 8:33 AM

## 2022-09-10 NOTE — Assessment & Plan Note (Signed)
History of bicuspid aortic valve with only mild aortic stenosis by 2D echo performed 03/18/2022 with a mean gradient of 11 mmHg.

## 2022-09-12 ENCOUNTER — Other Ambulatory Visit: Payer: Self-pay | Admitting: Family Medicine

## 2022-09-12 ENCOUNTER — Other Ambulatory Visit (HOSPITAL_COMMUNITY): Payer: Self-pay

## 2022-09-12 ENCOUNTER — Telehealth: Payer: Self-pay

## 2022-09-12 MED ORDER — VALACYCLOVIR HCL 1 G PO TABS: 2000.0000 mg | ORAL_TABLET | Freq: Two times a day (BID) | ORAL | 1 refills | Status: DC

## 2022-09-12 NOTE — Telephone Encounter (Signed)
Pt has 2 fever blisters on mouth, that are painful per pt. Pt asks if there is any way to have an prescription sent in to treat?  Pt uses [facility]. Thank you.

## 2022-09-13 DIAGNOSIS — K5190 Ulcerative colitis, unspecified, without complications: Secondary | ICD-10-CM | POA: Diagnosis not present

## 2022-09-16 ENCOUNTER — Other Ambulatory Visit: Payer: Self-pay

## 2022-09-17 ENCOUNTER — Encounter (HOSPITAL_COMMUNITY): Payer: Self-pay | Admitting: Physician Assistant

## 2022-09-17 ENCOUNTER — Telehealth: Payer: Self-pay | Admitting: Pharmacist

## 2022-09-17 ENCOUNTER — Ambulatory Visit (HOSPITAL_COMMUNITY)
Admission: RE | Admit: 2022-09-17 | Discharge: 2022-09-17 | Disposition: A | Discharge status: Home / Self Care | Payer: PPO | Source: Ambulatory Visit | Attending: Physician Assistant | Admitting: Physician Assistant

## 2022-09-17 VITALS — BP 134/100 | HR 89 | Ht 70.0 in | Wt 211.4 lb

## 2022-09-17 DIAGNOSIS — E1122 Type 2 diabetes mellitus with diabetic chronic kidney disease: Secondary | ICD-10-CM | POA: Insufficient documentation

## 2022-09-17 DIAGNOSIS — I2510 Atherosclerotic heart disease of native coronary artery without angina pectoris: Secondary | ICD-10-CM | POA: Diagnosis not present

## 2022-09-17 DIAGNOSIS — I4819 Other persistent atrial fibrillation: Secondary | ICD-10-CM

## 2022-09-17 DIAGNOSIS — I129 Hypertensive chronic kidney disease with stage 1 through stage 4 chronic kidney disease, or unspecified chronic kidney disease: Secondary | ICD-10-CM | POA: Insufficient documentation

## 2022-09-17 DIAGNOSIS — D015 Carcinoma in situ of liver, gallbladder and bile ducts: Secondary | ICD-10-CM | POA: Insufficient documentation

## 2022-09-17 DIAGNOSIS — Z006 Encounter for examination for normal comparison and control in clinical research program: Secondary | ICD-10-CM

## 2022-09-17 DIAGNOSIS — N183 Chronic kidney disease, stage 3 unspecified: Secondary | ICD-10-CM | POA: Diagnosis not present

## 2022-09-17 DIAGNOSIS — D6869 Other thrombophilia: Secondary | ICD-10-CM | POA: Insufficient documentation

## 2022-09-17 DIAGNOSIS — Z7901 Long term (current) use of anticoagulants: Secondary | ICD-10-CM | POA: Diagnosis not present

## 2022-09-17 LAB — MAGNESIUM: Magnesium: 2.2 mg/dL (ref 1.7–2.4)

## 2022-09-17 NOTE — Patient Instructions (Signed)
Do NOT take trulicity on Saturday 9/77

## 2022-09-17 NOTE — Progress Notes (Signed)
Primary Care Physician: Susy Frizzle, MD Primary Cardiologist: Dr Gwenlyn Found Primary Electrophysiologist: Dr Rayann Heman Referring Physician: Dr Nada Boozer is a 67 y.o. male with a history of HTN, DM, CAD, CKD, PVD, hepatocellular carcinoma, atrial fibrillation who presents for consultation in the [facility].  He reports being told when having "a test" in July 2022 that he had afib.  He was unaware.  He had an event monitor placed which revealed afib with burden of 25%. His coreg was increased to 37.'5mg'$  BID. He denies any awareness of his afib. Patient is on Eliquis for a CHADS2VASC score of 4.   On follow up today, patient reports that over the past several weeks he has been much more fatigued. ECG at Dr Kennon Holter office on 09/10/22 showed he was back in afib. Patient believes he is going in and out of rhythm because his symptoms are intermittent. He is in afib today. There are no specific triggers for his afib that he can identify. No bleeding issues on anticoagulation.   Today, he denies symptoms of palpitations, chest pain, shortness of breath, orthopnea, PND, lower extremity edema, dizziness, presyncope, syncope, snoring, daytime somnolence, bleeding, or neurologic sequela. The patient is tolerating medications without difficulties and is otherwise without complaint today.    Atrial Fibrillation Risk Factors:  he does not have symptoms or diagnosis of sleep apnea. he does not have a history of rheumatic fever.   he has a BMI of Body mass index is 30.33 kg/m.Marland Kitchen Filed Weights   09/17/22 0958  Weight: 95.9 kg    Family History  Problem Relation Age of Onset   Colon cancer Maternal Grandmother        in her 36's   Colon polyps Mother        in her 68's   Diabetes Mother    Heart disease Father    Diabetes Maternal Aunt        Aunts and Uncles   Esophageal cancer Neg Hx    Stomach cancer Neg Hx    Rectal cancer Neg Hx      Atrial  Fibrillation Management history:  Previous antiarrhythmic drugs: none Previous cardioversions: none Previous ablations: none CHADS2VASC score: 4 Anticoagulation history: Eliquis   Past Medical History:  Diagnosis Date   Adenomatous colon polyp 03/2008   Anginal pain ([place])    Aortic aneurysm, thoracic ([place]) 09/09/2016   4.4 cm by echo 05/2015   Arthritis    "joints in my fingers ache" (07/13/2015)   Bicuspid aortic valve 09/09/2016   Cataract 2019   bilateral eyes   CKD (chronic kidney disease), stage III ([place])    Coronary artery disease    Diabetes mellitus without complication ([place])    Hemorrhoids    Hepatocellular carcinoma ([place])    Hyperlipidemia    Hypertension    Paroxysmal atrial fibrillation ([place])    Peripheral arterial disease ([place])    a. dopppler 05/29/2015 revealed high-grade right popliteal and distal left SFA disease b. LE angio 06/28/2015 revealing high grade calcific dx with distal L SFA and R popliteal artery s/p diamondback orbital rotational atherectomy, PTA of L SFA  c. 07/13/2015 diamondback orbital rotational atherectomy and drug eluting balloon angioplasty of R popliteal artery (P2 segment) using distal protection    Type II diabetes mellitus ([place])    Ulcerative colitis    Past Surgical History:  Procedure Laterality Date   ABDOMINAL AORTOGRAM W/LOWER EXTREMITY N/A 04/27/2018   Procedure:  ABDOMINAL AORTOGRAM W/LOWER EXTREMITY;  Surgeon: Lorretta Harp, MD;  Location: [facility];  Service: Cardiovascular;  Laterality: N/A;   COLONOSCOPY     COLONOSCOPY W/ POLYPECTOMY  X 4-5   CORONARY ATHERECTOMY N/A 02/15/2021   Procedure: CORONARY ATHERECTOMY;  Surgeon: Lorretta Harp, MD;  Location: [facility];  Service: Cardiovascular;  Laterality: N/A;  aborted    CORONARY BALLOON ANGIOPLASTY N/A 02/15/2021   Procedure: CORONARY BALLOON ANGIOPLASTY;  Surgeon: Lorretta Harp, MD;  Location: [facility];  Service: Cardiovascular;   Laterality: N/A;   COSMETIC SURGERY  < 2000   "removed birthmark from top of my head"   IR ANGIOGRAM SELECTIVE EACH ADDITIONAL VESSEL  06/08/2021   IR ANGIOGRAM SELECTIVE EACH ADDITIONAL VESSEL  06/08/2021   IR ANGIOGRAM SELECTIVE EACH ADDITIONAL VESSEL  06/08/2021   IR ANGIOGRAM SELECTIVE EACH ADDITIONAL VESSEL  06/08/2021   IR ANGIOGRAM SELECTIVE EACH ADDITIONAL VESSEL  06/21/2021   IR ANGIOGRAM SELECTIVE EACH ADDITIONAL VESSEL  06/21/2021   IR ANGIOGRAM SELECTIVE EACH ADDITIONAL VESSEL  06/21/2021   IR ANGIOGRAM SELECTIVE EACH ADDITIONAL VESSEL  06/21/2021   IR ANGIOGRAM VISCERAL SELECTIVE  06/08/2021   IR ANGIOGRAM VISCERAL SELECTIVE  06/21/2021   IR EMBO ARTERIAL NOT HEMORR HEMANG INC GUIDE ROADMAPPING  06/08/2021   IR EMBO TUMOR ORGAN ISCHEMIA INFARCT INC GUIDE ROADMAPPING  06/21/2021   IR EMBO TUMOR ORGAN ISCHEMIA INFARCT INC GUIDE ROADMAPPING  06/21/2021   IR RADIOLOGIST EVAL & MGMT  05/08/2021   IR RADIOLOGIST EVAL & MGMT  07/26/2021   IR RADIOLOGIST EVAL & MGMT  09/05/2021   IR RADIOLOGIST EVAL & MGMT  12/27/2021   IR US GUIDE VASC ACCESS LEFT  06/21/2021   IR US GUIDE VASC ACCESS RIGHT  06/08/2021   LEFT HEART CATH AND CORONARY ANGIOGRAPHY N/A 02/08/2021   Procedure: LEFT HEART CATH AND CORONARY ANGIOGRAPHY;  Surgeon: Lorretta Harp, MD;  Location: [facility];  Service: Cardiovascular;  Laterality: N/A;   PERIPHERAL VASCULAR ATHERECTOMY  04/27/2018   Procedure: PERIPHERAL VASCULAR ATHERECTOMY;  Surgeon: Lorretta Harp, MD;  Location: [facility];  Service: Cardiovascular;;  right popliteal artery   PERIPHERAL VASCULAR CATHETERIZATION N/A 06/29/2015   Procedure: Lower Extremity Angiography;  Surgeon: Lorretta Harp, MD;  Location: [facility];  Service: Cardiovascular;  Laterality: N/A;   PERIPHERAL VASCULAR CATHETERIZATION N/A 07/13/2015   Procedure: Lower Extremity Angiography;  Surgeon: Lorretta Harp, MD;  Location: [facility];   Service: Cardiovascular;  Laterality: N/A;   PERIPHERAL VASCULAR CATHETERIZATION  07/13/2015   Procedure: Peripheral Vascular Atherectomy;  Surgeon: Lorretta Harp, MD;  Location: [facility];  Service: Cardiovascular;;   PERIPHERAL VASCULAR INTERVENTION  04/27/2018   Procedure: PERIPHERAL VASCULAR INTERVENTION;  Surgeon: Lorretta Harp, MD;  Location: [facility];  Service: Cardiovascular;;  right popliteal artery   POLYPECTOMY     TONSILLECTOMY     UPPER GASTROINTESTINAL ENDOSCOPY      Current Outpatient Medications  Medication Sig Dispense Refill   acetaminophen (TYLENOL) 500 MG tablet Take 1 tablet (500 mg total) by mouth every 6 (six) hours as needed. 30 tablet 0   apixaban (ELIQUIS) 5 MG TABS tablet Take 1 tablet (5 mg total) by mouth 2 (two) times daily. 180 tablet 3   aspirin EC 81 MG tablet Take 1 tablet (81 mg total) by mouth daily. 90 tablet 3   atorvastatin (LIPITOR) 80 MG tablet TAKE  1 TABLET BY MOUTH EVERY DAY 90 tablet 3   carvedilol (COREG) 12.5 MG tablet TAKE 3 TABLETS (37.5 MG) BY MOUTH 2 TIMES DAILY 180 tablet 2   cyanocobalamin (VITAMIN B12) 1000 MCG tablet Take 1,000 mcg by mouth daily.     Dulaglutide (TRULICITY) 1.5 GD/9.2EQ SOPN Inject 1.5 mg into the skin once a week. 4 mL 3   Dulaglutide (TRULICITY) 3 AS/3.4HD SOPN Inject 3 mg as directed once a week. 4 mL 1   fenofibrate 160 MG tablet Take 1 tablet (160 mg total) by mouth daily. 90 tablet 3   glucose blood (ONETOUCH ULTRA) test strip CHECK BLOOD SUGAR TWICE A DAY 50 strip 15   HYDROcodone-acetaminophen (NORCO) 7.5-325 MG tablet Take 1 tablet by mouth every 6 (six) hours as needed for moderate pain. 30 tablet 0   Insulin Pen Needle (B-D UF III MINI PEN NEEDLES) 31G X 5 MM MISC Use daily with Victoza 100 each 11   JARDIANCE 25 MG TABS tablet TAKE 1 TABLET BY MOUTH DAILY BEFORE BREAKFAST. 90 tablet 0   lenvatinib 12 mg daily dose (LENVIMA) 3 x 4 MG capsule Take 3 capsules (12 mg total) by mouth  daily. 90 capsule 0   lisinopril (ZESTRIL) 20 MG tablet Take 1 tablet (20 mg total) by mouth daily. 90 tablet 3   loperamide (IMODIUM A-D) 2 MG tablet Take 2 mg by mouth 4 (four) times daily as needed for diarrhea or loose stools.     methocarbamol (ROBAXIN) 500 MG tablet Take 1 tablet (500 mg total) by mouth 2 (two) times daily. 10 tablet 0   Omega-3 Fatty Acids (FISH OIL) 1000 MG CAPS Take 1,000 mg by mouth 2 (two) times daily.     pantoprazole (PROTONIX) 40 MG tablet Take 1 tablet (40 mg total) by mouth 2 (two) times daily. 60 tablet 11   pioglitazone (ACTOS) 30 MG tablet TAKE 1 TABLET BY MOUTH EVERY DAY. STRENGTH: 30 MG 90 tablet 1   prochlorperazine (COMPAZINE) 10 MG tablet Take 1 tablet (10 mg total) by mouth every 6 (six) hours as needed for nausea. 60 tablet 1   sildenafil (VIAGRA) 100 MG tablet Take 0.5-1 tablets (50-100 mg total) by mouth daily as needed for erectile dysfunction. 5 tablet 11   valACYclovir (VALTREX) 1000 MG tablet Take 2 tablets (2,000 mg total) by mouth 2 (two) times daily. 4 tablet 1   vedolizumab (ENTYVIO) 300 MG injection Inject 300 mg as directed See admin instructions. Every 2 months     No current facility-administered medications for this encounter.    Allergies  Allergen Reactions   Penicillins Other (See Comments)    Unsure, childhood reaction Has patient had a PCN reaction causing immediate rash, facial/tongue/throat swelling, SOB or lightheadedness with hypotension: Unknown Has patient had a PCN reaction causing severe rash involving mucus membranes or skin necrosis: Unknown Has patient had a PCN reaction that required hospitalization: No Has patient had a PCN reaction occurring within the last 10 years: No If all of the above answers are "NO", then may proceed with Cephalosporin use.     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[employer], academic  Tobacco Use   Smoking status: Former  Packs/day: 1.50    Years: 25.00    Total pack years: 37.50    Types: Cigarettes    Quit date: 08/12/1996    Years since quitting: 26.1   Smokeless tobacco: Never   Tobacco comments:    Former smoker 09/17/2022  Vaping Use   Vaping Use: Never used  Substance and Sexual Activity   Alcohol use: Yes    Alcohol/week: 1.0 standard drink of alcohol    Types: 1 Cans of beer per week    Comment: social use   Drug use: No   Sexual activity: Yes  Other Topics Concern   Not on file  Social History Narrative   ** Merged History Encounter **       Social Determinants of Health   Financial Resource Strain: Low Risk  (08/17/2021)   Overall Financial Resource Strain (CARDIA)    Difficulty of Paying Living Expenses: Not very hard  Food Insecurity: Not on file  Transportation Needs: Not on file  Physical Activity: Not on file  Stress: Not on file  Social Connections: Not on file  Intimate Partner Violence: Not on file     ROS- All systems are reviewed and negative except as per the HPI above.  Physical Exam: Vitals:   09/17/22 0958  BP: (!) 134/100  Pulse: 89  Weight: 95.9 kg  Height: '5\' 10"'$  (1.778 m)    GEN- The patient is a well appearing male, alert and oriented x 3 today.   HEENT-head normocephalic, atraumatic, sclera clear, conjunctiva pink, hearing intact, trachea midline. Lungs- Clear to ausculation bilaterally, normal work of breathing Heart- irregular rate and rhythm, no murmurs, rubs or gallops  GI- soft, NT, ND, + BS Extremities- no clubbing, cyanosis, or edema MS- no significant deformity or atrophy Skin- no rash or lesion Psych- euthymic mood, full affect Neuro- strength and sensation are intact   Wt Readings from Last 3 Encounters:  09/17/22 95.9 kg  09/10/22 95.5 kg  09/02/22 95.9 kg    EKG  today demonstrates  Afib Vent. rate 89 BPM PR interval * ms QRS duration 84 ms QT/QTcB 392/476 ms  Echo 03/13/21 demonstrated   1. Left ventricular ejection fraction, by estimation, is 60 to 65%. The left ventricle has normal function. The left ventricle has no regional wall motion abnormalities. There is mild left ventricular hypertrophy.  Left ventricular diastolic parameters are consistent with Grade II diastolic dysfunction (pseudonormalization).   2. Right ventricular systolic function is normal. The right ventricular  size is normal.   3. Left atrial size was moderately dilated.   4. Right atrial size was moderately dilated.   5. The mitral valve is normal in structure. Trivial mitral valve  regurgitation. No evidence of mitral stenosis.   6. The aortic valve is tricuspid. There is moderate calcification of the aortic valve. Aortic valve regurgitation is trivial. Mild aortic valve  stenosis.   7. Aortic dilatation noted. There is mild dilatation of the ascending  aorta, measuring 40 mm.   8. The inferior vena cava is normal in size with greater than 50%  respiratory variability, suggesting right atrial pressure of 3 mmHg.   Epic records are reviewed at length today  CHA2DS2-VASc Score = 4  The patient's score is based upon: CHF History: 0 HTN History: 1 Diabetes History: 1 Stroke History: 0 Vascular Disease History: 1 Age Score: 1 Gender Score: 0        ASSESSMENT AND PLAN: 1. Persistent atrial fibrillation  The patient's CHA2DS2-VASc score  is 4, indicating a 4.8% annual risk of stroke.   Patient remains in symptomatic afib today.  Would avoid class IC with h/o CAD. May be best to avoid Multaq and amiodarone given his ongoing treatment for [facility] although his LFTs are normal.  Patient would like to pursue dofetilide admission. Continue Eliquis 5 mg BID, states no missed doses in the last 3 weeks. No recent benadryl use PharmD to screen medications. QTc in SR 437  ms Recent lab work reviewed. Check magnesium today.  Continue carvedilol 37.5 mg BID  2. Secondary Hypercoagulable State (ICD10:  D68.69) The patient is at significant risk for stroke/thromboembolism based upon his CHA2DS2-VASc Score of 4.  Continue Apixaban (Eliquis).   3. CAD No anginal symptoms. Followed by Dr Gwenlyn Found.   4. HTN Stable, mildly elevated today, will reassess in SR.  5. Hepatocellular carcinoma  Followed by Dr Benay Spice   Follow up in the AF clinic for dofetilide loading 09/24/22.   [facility] [address] [place], [place] [address] [phone] 09/17/2022 10:12 AM

## 2022-09-17 NOTE — Telephone Encounter (Signed)
Is not contraindicated. But Matthew Chen is known to increase QT. It is labeled as a "major" interaction with Tikosyn so will require close monitoring.  The Lenvima prescribing information states:  QT Interval Prolongation: Monitor and correct electrolyte abnormalities. Withhold for QT interval greater than 500 ms or for 60 ms or greater increase in baseline QT interval

## 2022-09-17 NOTE — Research (Signed)
[facility] Informed Consent   Subject Name: Matthew Chen  Subject met inclusion and exclusion criteria.  The informed consent form, study requirements and expectations were reviewed with the subject and questions and concerns were addressed prior to the signing of the consent form.  The subject verbalized understanding of the trial requirements.  The subject agreed to participate in the [facility] trial and signed the informed consent on 6/Feb/2024.  The informed consent was obtained prior to performance of any protocol-specific procedures for the subject.  A copy of the signed informed consent was given to the subject and a copy was placed in the subject's medical record.   [name]

## 2022-09-17 NOTE — Telephone Encounter (Signed)
-----   Message from Juluis Mire, RN sent at 09/17/2022 10:43 AM EST ----- Regarding: tikosyn Pt for tikosyn please review meds thanks stacy

## 2022-09-17 NOTE — Telephone Encounter (Signed)
Medication list reviewed in anticipation of upcoming Tikosyn initiation. Patient is taking 1 contraindicated or QTc prolonging medications.  Concurrent use of LENVATINIB (Lenvima) and Tikosyn may result in increased risk of QT-interval prolongation. Recommend discussing with oncologist if patient can hold therapy.  Patient is anticoagulated on Eliquis on the appropriate dose. Please ensure that patient has not missed any anticoagulation doses in the 3 weeks prior to Tikosyn initiation.   Patient will need to be counseled to avoid use of Benadryl while on Tikosyn and in the 2-3 days prior to Tikosyn initiation.

## 2022-09-19 ENCOUNTER — Other Ambulatory Visit: Payer: Self-pay

## 2022-09-19 DIAGNOSIS — Z79899 Other long term (current) drug therapy: Secondary | ICD-10-CM

## 2022-09-20 ENCOUNTER — Telehealth: Payer: Self-pay

## 2022-09-20 ENCOUNTER — Encounter (HOSPITAL_COMMUNITY): Payer: Self-pay

## 2022-09-20 NOTE — Progress Notes (Signed)
   Care Guide Note  09/20/2022 Name: Matthew Chen MRN: [id number] DOB: [birth year]  Referred by: Susy Frizzle, MD Reason for referral : Care Coordination (Outreach to schedule Pharm d )   Matthew Chen is a 67 y.o. year old male who is a primary care patient of Susy Frizzle, MD. Matthew Chen was referred to the pharmacist for assistance related to DM.    Successful contact was made with the patient to discuss pharmacy services including being ready for the pharmacist to call at least 5 minutes before the scheduled appointment time, to have medication bottles and any blood sugar or blood pressure readings ready for review. The patient agreed to meet with the pharmacist via with the pharmacist via telephone visit on (date/time).  10/02/2022  Noreene Larsson, [place], [place] [address] Direct Dial: [phone] [email or web address]

## 2022-09-20 NOTE — Progress Notes (Signed)
   Care Guide Note  09/20/2022 Name: Matthew Chen MRN: [id number] DOB: 15-Aug-1955  Referred by: Susy Frizzle, MD Reason for referral : Care Coordination (Outreach to schedule Pharm d )   Matthew Chen is a 67 y.o. year old male who is a primary care patient of Susy Frizzle, MD. Matthew Chen was referred to the pharmacist for assistance related to DM.    An unsuccessful telephone outreach was attempted today to contact the patient who was referred to the pharmacy team for assistance with medication management. Additional attempts will be made to contact the patient.   Noreene Larsson, [place], [place] [address] Direct Dial: [phone] [name]

## 2022-09-23 ENCOUNTER — Inpatient Hospital Stay: Payer: PPO | Attending: Oncology

## 2022-09-23 ENCOUNTER — Inpatient Hospital Stay: Payer: PPO | Admitting: Oncology

## 2022-09-23 ENCOUNTER — Encounter: Payer: Self-pay | Admitting: Oncology

## 2022-09-23 VITALS — BP 109/90 | HR 62 | Temp 98.1°F | Resp 18 | Ht 70.0 in | Wt 209.0 lb

## 2022-09-23 DIAGNOSIS — C220 Liver cell carcinoma: Secondary | ICD-10-CM

## 2022-09-23 DIAGNOSIS — N183 Chronic kidney disease, stage 3 unspecified: Secondary | ICD-10-CM | POA: Diagnosis present

## 2022-09-23 DIAGNOSIS — Z880 Allergy status to penicillin: Secondary | ICD-10-CM | POA: Diagnosis not present

## 2022-09-23 DIAGNOSIS — E785 Hyperlipidemia, unspecified: Secondary | ICD-10-CM | POA: Diagnosis present

## 2022-09-23 DIAGNOSIS — K760 Fatty (change of) liver, not elsewhere classified: Secondary | ICD-10-CM | POA: Diagnosis present

## 2022-09-23 DIAGNOSIS — R531 Weakness: Secondary | ICD-10-CM | POA: Insufficient documentation

## 2022-09-23 DIAGNOSIS — D6869 Other thrombophilia: Secondary | ICD-10-CM | POA: Diagnosis present

## 2022-09-23 DIAGNOSIS — Z7901 Long term (current) use of anticoagulants: Secondary | ICD-10-CM | POA: Diagnosis not present

## 2022-09-23 DIAGNOSIS — E1122 Type 2 diabetes mellitus with diabetic chronic kidney disease: Secondary | ICD-10-CM | POA: Diagnosis present

## 2022-09-23 DIAGNOSIS — Z86010 Personal history of colonic polyps: Secondary | ICD-10-CM | POA: Diagnosis not present

## 2022-09-23 DIAGNOSIS — R9431 Abnormal electrocardiogram [ECG] [EKG]: Secondary | ICD-10-CM | POA: Diagnosis present

## 2022-09-23 DIAGNOSIS — I129 Hypertensive chronic kidney disease with stage 1 through stage 4 chronic kidney disease, or unspecified chronic kidney disease: Secondary | ICD-10-CM | POA: Diagnosis present

## 2022-09-23 DIAGNOSIS — Z79899 Other long term (current) drug therapy: Secondary | ICD-10-CM | POA: Diagnosis not present

## 2022-09-23 DIAGNOSIS — Z79620 Long term (current) use of immunosuppressive biologic: Secondary | ICD-10-CM | POA: Diagnosis not present

## 2022-09-23 DIAGNOSIS — Z800 Family history of malignant neoplasm of digestive organs: Secondary | ICD-10-CM | POA: Diagnosis not present

## 2022-09-23 DIAGNOSIS — I4819 Other persistent atrial fibrillation: Secondary | ICD-10-CM | POA: Diagnosis present

## 2022-09-23 DIAGNOSIS — Z8249 Family history of ischemic heart disease and other diseases of the circulatory system: Secondary | ICD-10-CM | POA: Diagnosis not present

## 2022-09-23 DIAGNOSIS — Z83719 Family history of colon polyps, unspecified: Secondary | ICD-10-CM | POA: Diagnosis not present

## 2022-09-23 DIAGNOSIS — Z833 Family history of diabetes mellitus: Secondary | ICD-10-CM | POA: Diagnosis not present

## 2022-09-23 DIAGNOSIS — E1151 Type 2 diabetes mellitus with diabetic peripheral angiopathy without gangrene: Secondary | ICD-10-CM | POA: Diagnosis present

## 2022-09-23 DIAGNOSIS — K5190 Ulcerative colitis, unspecified, without complications: Secondary | ICD-10-CM | POA: Diagnosis present

## 2022-09-23 DIAGNOSIS — I2510 Atherosclerotic heart disease of native coronary artery without angina pectoris: Secondary | ICD-10-CM | POA: Diagnosis present

## 2022-09-23 DIAGNOSIS — I480 Paroxysmal atrial fibrillation: Secondary | ICD-10-CM | POA: Insufficient documentation

## 2022-09-23 LAB — CMP (CANCER CENTER ONLY)
ALT: 22 U/L (ref 0–44)
AST: 41 U/L (ref 15–41)
Albumin: 3.7 g/dL (ref 3.5–5.0)
Alkaline Phosphatase: 83 U/L (ref 38–126)
Anion gap: 7 (ref 5–15)
BUN: 24 mg/dL — ABNORMAL HIGH (ref 8–23)
CO2: 29 mmol/L (ref 22–32)
Calcium: 9.3 mg/dL (ref 8.9–10.3)
Chloride: 104 mmol/L (ref 98–111)
Creatinine: 1.87 mg/dL — ABNORMAL HIGH (ref 0.61–1.24)
GFR, Estimated: 39 mL/min — ABNORMAL LOW (ref >60)
Glucose, Bld: 101 mg/dL — ABNORMAL HIGH (ref 70–99)
Potassium: 4.4 mmol/L (ref 3.5–5.1)
Sodium: 140 mmol/L (ref 135–145)
Total Bilirubin: 0.9 mg/dL (ref 0.3–1.2)
Total Protein: 7 g/dL (ref 6.5–8.1)

## 2022-09-23 LAB — CBC WITH DIFFERENTIAL (CANCER CENTER ONLY)
Abs Immature Granulocytes: 0.01 10*3/uL (ref 0.00–0.07)
Basophils Absolute: 0.1 10*3/uL (ref 0.0–0.1)
Basophils Relative: 2 %
Eosinophils Absolute: 0.2 10*3/uL (ref 0.0–0.5)
Eosinophils Relative: 4 %
HCT: 48 % (ref 39.0–52.0)
Hemoglobin: 14.6 g/dL (ref 13.0–17.0)
Immature Granulocytes: 0 %
Lymphocytes Relative: 22 %
Lymphs Abs: 1.2 10*3/uL (ref 0.7–4.0)
MCH: 24.9 pg — ABNORMAL LOW (ref 26.0–34.0)
MCHC: 30.4 g/dL (ref 30.0–36.0)
MCV: 81.8 fL (ref 80.0–100.0)
Monocytes Absolute: 0.6 10*3/uL (ref 0.1–1.0)
Monocytes Relative: 11 %
Neutro Abs: 3.3 10*3/uL (ref 1.7–7.7)
Neutrophils Relative %: 61 %
Platelet Count: 244 10*3/uL (ref 150–400)
RBC: 5.87 MIL/uL — ABNORMAL HIGH (ref 4.22–5.81)
RDW: 22.5 % — ABNORMAL HIGH (ref 11.5–15.5)
WBC Count: 5.5 10*3/uL (ref 4.0–10.5)
nRBC: 0 % (ref 0.0–0.2)

## 2022-09-23 LAB — TOTAL PROTEIN, URINE DIPSTICK: Protein, ur: NEGATIVE mg/dL

## 2022-09-23 NOTE — Progress Notes (Signed)
[facility] OFFICE PROGRESS NOTE   Diagnosis: Hepatocellular carcinoma  INTERVAL HISTORY:   Matthew Chen returns as scheduled.  He began lenvatinib on 09/09/2022.  No mouth sores, rash, diarrhea, or bleeding.  He has exertional dyspnea.  He is followed by cardiology.  He is scheduled for hospital admission to begin [place] on 09/24/2022.  Objective:  Vital signs in last 24 hours:  Blood pressure (!) 109/90, pulse 62, temperature 98.1 F (36.7 C), resp. rate 18, height 5' 10"$  (1.778 m), weight 209 lb (94.8 kg), SpO2 100 %.    HEENT: No thrush or ulcers Resp: Lungs clear bilaterally Cardio: Irregular GI: No hepatosplenomegaly Vascular: No leg edema  Lab Results:  Lab Results  Component Value Date   WBC 5.5 09/23/2022   HGB 14.6 09/23/2022   HCT 48.0 09/23/2022   MCV 81.8 09/23/2022   PLT 244 09/23/2022   NEUTROABS 3.3 09/23/2022    CMP  Lab Results  Component Value Date   NA 140 09/23/2022   K 4.4 09/23/2022   CL 104 09/23/2022   CO2 29 09/23/2022   GLUCOSE 101 (H) 09/23/2022   BUN 24 (H) 09/23/2022   CREATININE 1.87 (H) 09/23/2022   CALCIUM 9.3 09/23/2022   PROT 7.0 09/23/2022   ALBUMIN 3.7 09/23/2022   AST 41 09/23/2022   ALT 22 09/23/2022   ALKPHOS 83 09/23/2022   BILITOT 0.9 09/23/2022   GFRNONAA 39 (L) 09/23/2022   GFRAA 49 (L) 11/20/2020    Lab Results  Component Value Date   CEA1 1.9 06/21/2021     Medications: I have reviewed the patient's current medications.   Assessment/Plan: Hepatocellular carcinoma CT angio chest aorta 01/30/2021-no evidence of thoracic aortic aneurysm, diffuse hepatic steatosis, suggestion of early cirrhosis, 7.1 cm enhancing masslike area in segment 5/6, prominent gastrohepatic and periportal nodes measuring up to 7 mm MRI liver 03/22/2021-arterial hyperenhancing subcapsular mass, segment 6, 7 x 5.1 cm, LI-RADS 5, no pathologically large lymph nodes Ultrasound-guided biopsy 04/24/2021-hepatocellular  carcinoma Y90 radioembolization of segment 6 and segment 7 hepatic arterial branches 06/21/2021 MRI abdomen 09/03/2021-new increased enhancement inferior and superior to the dominant right liver lesion, new 11 mm enhancing lesion in the left liver LIRADS 3, dominant right hepatic lesion stable in size MRI abdomen 12/19/2021-unchanged hyperenhancing mass in the anterior inferior right liver measuring 7.8 x 5 cm, interval enlargement of a arterial enhancing lesion in segment 2- LIRADS 5, new small enhancing lesions hepatic segment 4A measuring 1.2 x 0.9 cm and 0.8 x 0.6 cm- LIRADS 5 Cycle 1 atezolizumab /bevacizumab 02/07/2022 Cycle 2 atezolizumab/bevacizumab 02/27/2022 Cycle 3 atezolizumab/bevacizumab 03/21/2022 Cycle 4 atezolizumab/bevacizumab 04/22/2022 MRI abdomen 05/05/2022-no change in dominant segment 6 lesion, no change in hyperenhancing lesions in segment 4A and segment 2, no new lesions Cycle 5 atezolizumab/bevacizumab 05/13/2022 Cycle 6 atezolizumab/bevacizumab 06/04/2022 Cycle 7 atezolizumab/bevacizumab 06/24/2022 Cycle 8 atezolizumab/bevacizumab 07/15/2022 MRI abdomen 07/20/2022-mild progression of bilateral liver lesions, no evidence of extrahepatic metastatic disease MRI [facility] 08/19/2022-increased size of the dominant admitted 5/6 lesion, now measuring 12.7 x 8.8 cm, multiple additional hepatic lesions appear larger than on the previous exam, stable mildly enlarged upper abdominal lymph nodes Lenvatinib 09/09/2022 Ulcerative colitis-maintained on vedolizumab Peripheral arterial disease History of a thoracic aortic aneurysm CAD Diabetes Hypertension Chronic renal failure Paroxysmal atrial fibrillation     Disposition: [name] has hepatocellular carcinoma.  He was noted to have disease progression on MRIs in December and January.  He began a trial of second line therapy with lenvatinib on 09/09/2022.  He appears to be tolerating the lenvatinib well. He has exertional dyspnea felt to  be secondary to chronic atrial fibrillation.  He is scheduled for hospital admission tomorrow to initiate dofetilide.  We will consult with pharmacy regarding the drug interaction with lenvatinib.  Mr. Hochstetler will return for an office and lab visit in 3-4 weeks. Betsy Coder, MD  09/23/2022  9:02 AM

## 2022-09-24 ENCOUNTER — Other Ambulatory Visit: Payer: Self-pay | Admitting: Oncology

## 2022-09-24 ENCOUNTER — Inpatient Hospital Stay (HOSPITAL_COMMUNITY)
Admission: RE | Admit: 2022-09-24 | Discharge: 2022-09-28 | DRG: 309 | Disposition: A | Discharge status: Home / Self Care | Payer: PPO | Attending: Cardiology | Admitting: Cardiology

## 2022-09-24 ENCOUNTER — Other Ambulatory Visit: Payer: Self-pay

## 2022-09-24 ENCOUNTER — Encounter (HOSPITAL_COMMUNITY): Payer: Self-pay | Admitting: Physician Assistant

## 2022-09-24 ENCOUNTER — Other Ambulatory Visit (HOSPITAL_COMMUNITY): Payer: Self-pay

## 2022-09-24 ENCOUNTER — Encounter (HOSPITAL_COMMUNITY): Payer: Self-pay | Admitting: Cardiology

## 2022-09-24 ENCOUNTER — Ambulatory Visit (HOSPITAL_COMMUNITY)
Admission: RE | Admit: 2022-09-24 | Discharge: 2022-09-24 | Disposition: A | Discharge status: Home / Self Care | Payer: PPO | Source: Ambulatory Visit | Attending: Physician Assistant | Admitting: Physician Assistant

## 2022-09-24 VITALS — BP 104/84 | HR 94 | Ht 70.0 in | Wt 209.8 lb

## 2022-09-24 DIAGNOSIS — I2510 Atherosclerotic heart disease of native coronary artery without angina pectoris: Secondary | ICD-10-CM | POA: Diagnosis present

## 2022-09-24 DIAGNOSIS — I4819 Other persistent atrial fibrillation: Secondary | ICD-10-CM

## 2022-09-24 DIAGNOSIS — Z833 Family history of diabetes mellitus: Secondary | ICD-10-CM

## 2022-09-24 DIAGNOSIS — E1122 Type 2 diabetes mellitus with diabetic chronic kidney disease: Secondary | ICD-10-CM | POA: Diagnosis present

## 2022-09-24 DIAGNOSIS — Z79620 Long term (current) use of immunosuppressive biologic: Secondary | ICD-10-CM | POA: Diagnosis not present

## 2022-09-24 DIAGNOSIS — Z880 Allergy status to penicillin: Secondary | ICD-10-CM | POA: Diagnosis not present

## 2022-09-24 DIAGNOSIS — Z8249 Family history of ischemic heart disease and other diseases of the circulatory system: Secondary | ICD-10-CM | POA: Diagnosis not present

## 2022-09-24 DIAGNOSIS — R9431 Abnormal electrocardiogram [ECG] [EKG]: Secondary | ICD-10-CM | POA: Diagnosis present

## 2022-09-24 DIAGNOSIS — E785 Hyperlipidemia, unspecified: Secondary | ICD-10-CM | POA: Diagnosis present

## 2022-09-24 DIAGNOSIS — N183 Chronic kidney disease, stage 3 unspecified: Secondary | ICD-10-CM | POA: Diagnosis present

## 2022-09-24 DIAGNOSIS — Z79899 Other long term (current) drug therapy: Secondary | ICD-10-CM | POA: Diagnosis not present

## 2022-09-24 DIAGNOSIS — D6869 Other thrombophilia: Secondary | ICD-10-CM | POA: Diagnosis present

## 2022-09-24 DIAGNOSIS — Z800 Family history of malignant neoplasm of digestive organs: Secondary | ICD-10-CM

## 2022-09-24 DIAGNOSIS — C220 Liver cell carcinoma: Secondary | ICD-10-CM | POA: Diagnosis present

## 2022-09-24 DIAGNOSIS — Z7901 Long term (current) use of anticoagulants: Secondary | ICD-10-CM

## 2022-09-24 DIAGNOSIS — K5190 Ulcerative colitis, unspecified, without complications: Secondary | ICD-10-CM | POA: Diagnosis present

## 2022-09-24 DIAGNOSIS — I129 Hypertensive chronic kidney disease with stage 1 through stage 4 chronic kidney disease, or unspecified chronic kidney disease: Secondary | ICD-10-CM | POA: Diagnosis present

## 2022-09-24 DIAGNOSIS — K760 Fatty (change of) liver, not elsewhere classified: Secondary | ICD-10-CM | POA: Diagnosis present

## 2022-09-24 DIAGNOSIS — Z83719 Family history of colon polyps, unspecified: Secondary | ICD-10-CM

## 2022-09-24 DIAGNOSIS — E1151 Type 2 diabetes mellitus with diabetic peripheral angiopathy without gangrene: Secondary | ICD-10-CM | POA: Diagnosis present

## 2022-09-24 DIAGNOSIS — Z86010 Personal history of colonic polyps: Secondary | ICD-10-CM | POA: Diagnosis not present

## 2022-09-24 LAB — BASIC METABOLIC PANEL
Anion gap: 10 (ref 5–15)
BUN: 22 mg/dL (ref 8–23)
CO2: 25 mmol/L (ref 22–32)
Calcium: 8.8 mg/dL — ABNORMAL LOW (ref 8.9–10.3)
Chloride: 103 mmol/L (ref 98–111)
Creatinine, Ser: 1.96 mg/dL — ABNORMAL HIGH (ref 0.61–1.24)
GFR, Estimated: 37 mL/min — ABNORMAL LOW (ref >60)
Glucose, Bld: 219 mg/dL — ABNORMAL HIGH (ref 70–99)
Potassium: 4.2 mmol/L (ref 3.5–5.1)
Sodium: 138 mmol/L (ref 135–145)

## 2022-09-24 LAB — GLUCOSE, CAPILLARY
Glucose-Capillary: 113 mg/dL — ABNORMAL HIGH (ref 70–99)
Glucose-Capillary: 101 mg/dL — ABNORMAL HIGH (ref 70–99)

## 2022-09-24 LAB — MAGNESIUM: Magnesium: 2.1 mg/dL (ref 1.7–2.4)

## 2022-09-24 MED ORDER — LENVATINIB (12 MG DAILY DOSE) 3 X 4 MG PO CPPK
12.0000 mg | ORAL_CAPSULE | Freq: Every day | ORAL | 0 refills | Status: DC
  Filled 2022-09-24: qty 90, 30d supply, fill #0

## 2022-09-24 MED ORDER — HYDROCODONE-ACETAMINOPHEN 7.5-325 MG PO TABS
1.0000 | ORAL_TABLET | Freq: Four times a day (QID) | ORAL | Status: DC | PRN
  Administered 2022-09-26: 1 via ORAL
  Filled 2022-09-24: qty 1

## 2022-09-24 MED ORDER — LENVATINIB (12 MG DAILY DOSE) 3 X 4 MG PO CPPK
12.0000 mg | ORAL_CAPSULE | Freq: Every day | ORAL | Status: DC
  Administered 2022-09-25 – 2022-09-28 (×4): 12 mg via ORAL
  Filled 2022-09-24 (×5): qty 3

## 2022-09-24 MED ORDER — ASPIRIN 81 MG PO TBEC
81.0000 mg | DELAYED_RELEASE_TABLET | Freq: Every day | ORAL | Status: DC
  Administered 2022-09-25 – 2022-09-28 (×4): 81 mg via ORAL
  Filled 2022-09-24 (×4): qty 1

## 2022-09-24 MED ORDER — PANTOPRAZOLE SODIUM 40 MG PO TBEC
40.0000 mg | DELAYED_RELEASE_TABLET | Freq: Two times a day (BID) | ORAL | Status: DC
  Administered 2022-09-24 – 2022-09-28 (×8): 40 mg via ORAL
  Filled 2022-09-24 (×8): qty 1

## 2022-09-24 MED ORDER — CARVEDILOL 25 MG PO TABS
37.5000 mg | ORAL_TABLET | Freq: Two times a day (BID) | ORAL | Status: DC
  Administered 2022-09-24 – 2022-09-25 (×3): 37.5 mg via ORAL
  Filled 2022-09-24 (×4): qty 1

## 2022-09-24 MED ORDER — PIOGLITAZONE HCL 30 MG PO TABS
30.0000 mg | ORAL_TABLET | Freq: Every day | ORAL | Status: DC
  Administered 2022-09-25 – 2022-09-28 (×4): 30 mg via ORAL
  Filled 2022-09-24 (×4): qty 1

## 2022-09-24 MED ORDER — APIXABAN 5 MG PO TABS
5.0000 mg | ORAL_TABLET | Freq: Two times a day (BID) | ORAL | Status: DC
  Administered 2022-09-24 – 2022-09-28 (×8): 5 mg via ORAL
  Filled 2022-09-24 (×8): qty 1

## 2022-09-24 MED ORDER — DOFETILIDE 250 MCG PO CAPS
250.0000 ug | ORAL_CAPSULE | Freq: Two times a day (BID) | ORAL | Status: DC
  Administered 2022-09-24 – 2022-09-26 (×4): 250 ug via ORAL
  Filled 2022-09-24 (×4): qty 1

## 2022-09-24 MED ORDER — SODIUM CHLORIDE 0.9% FLUSH: 3.0000 mL | INTRAVENOUS | Status: DC | PRN

## 2022-09-24 MED ORDER — VITAMIN B-12 1000 MCG PO TABS
1000.0000 ug | ORAL_TABLET | Freq: Every day | ORAL | Status: DC
  Administered 2022-09-25 – 2022-09-28 (×4): 1000 ug via ORAL
  Filled 2022-09-24 (×4): qty 1

## 2022-09-24 MED ORDER — LISINOPRIL 20 MG PO TABS
20.0000 mg | ORAL_TABLET | Freq: Every day | ORAL | Status: DC
  Administered 2022-09-25 – 2022-09-28 (×4): 20 mg via ORAL
  Filled 2022-09-24 (×4): qty 1

## 2022-09-24 MED ORDER — EMPAGLIFLOZIN 25 MG PO TABS
25.0000 mg | ORAL_TABLET | Freq: Every day | ORAL | Status: DC
  Administered 2022-09-26 – 2022-09-28 (×3): 25 mg via ORAL
  Filled 2022-09-24 (×3): qty 1

## 2022-09-24 MED ORDER — FENOFIBRATE 160 MG PO TABS
160.0000 mg | ORAL_TABLET | Freq: Every day | ORAL | Status: DC
  Administered 2022-09-25 – 2022-09-28 (×4): 160 mg via ORAL
  Filled 2022-09-24 (×4): qty 1

## 2022-09-24 MED ORDER — SODIUM CHLORIDE 0.9% FLUSH
3.0000 mL | Freq: Two times a day (BID) | INTRAVENOUS | Status: DC
  Administered 2022-09-24 – 2022-09-28 (×7): 3 mL via INTRAVENOUS

## 2022-09-24 MED ORDER — ATORVASTATIN CALCIUM 80 MG PO TABS
80.0000 mg | ORAL_TABLET | Freq: Every day | ORAL | Status: DC
  Administered 2022-09-25 – 2022-09-28 (×4): 80 mg via ORAL
  Filled 2022-09-24 (×4): qty 1

## 2022-09-24 MED ORDER — SODIUM CHLORIDE 0.9 % IV SOLN: 250.0000 mL | INTRAVENOUS | Status: DC | PRN

## 2022-09-24 MED ORDER — INSULIN ASPART 100 UNIT/ML IJ SOLN
0.0000 [IU] | Freq: Three times a day (TID) | INTRAMUSCULAR | Status: DC
  Administered 2022-09-26 – 2022-09-27 (×2): 2 [IU] via SUBCUTANEOUS

## 2022-09-24 NOTE — Care Management (Signed)
  Transition of Care [facility]) Screening Note   Patient Details  Name: Matthew Chen Date of Birth: 03/29/1956   Transition of Care [facility]) CM/SW Contact:    [name], RN Phone Number: 09/24/2022, 4:08 PM   Transition of Care Department [facility]) has reviewed the patient. Patient presented for Tikosyn Load. Benefits check submitted for cost. Case Manager will discuss cost and pharmacy of choice as the patient progresses.

## 2022-09-24 NOTE — H&P (Addendum)
Electrophysiology H&P  Note    Primary Care Physician: Susy Frizzle, MD Primary Cardiologist: Dr Gwenlyn Found Primary Electrophysiologist: Dr Curt Bears (new) Referring Physician: Dr Nada Boozer is a 67 y.o. male with a history of HTN, DM, CAD, CKD, PVD, hepatocellular carcinoma, atrial fibrillation who presents for consultation in the [facility].  He reports being told when having "a test" in July 2022 that he had afib.  He was unaware.  He had an event monitor placed which revealed afib with burden of 25%. His coreg was increased to 37.71m BID. He denies any awareness of his afib. Patient is on Eliquis for a CHADS2VASC score of 4.   ECG at Dr BKennon Holteroffice on 09/10/22 showed he was back in afib. Patient believes he is going in and out of rhythm because his symptoms are intermittent but he is very fatigued when in afib.  On follow up today, patient presents for dofetilide loading. He remains in afib with symptoms of fatigue. He denies any missed doses of anticoagulation.   Today, he denies symptoms of palpitations, chest pain, shortness of breath, orthopnea, PND, lower extremity edema, dizziness, presyncope, syncope, snoring, daytime somnolence, bleeding, or neurologic sequela. The patient is tolerating medications without difficulties and is otherwise without complaint today.    Atrial Fibrillation Risk Factors:  he does not have symptoms or diagnosis of sleep apnea. he does not have a history of rheumatic fever.   he has a BMI of 30.1 kg/m.   Family History  Problem Relation Age of Onset   Colon cancer Maternal Grandmother        in her 641's  Colon polyps Mother        in her 514's  Diabetes Mother    Heart disease Father    Diabetes Maternal Aunt        Aunts and Uncles   Esophageal cancer Neg Hx    Stomach cancer Neg Hx    Rectal cancer Neg Hx      Atrial Fibrillation Management history:  Previous antiarrhythmic drugs:  none Previous cardioversions: none Previous ablations: none CHADS2VASC score: 4 Anticoagulation history: Eliquis   Past Medical History:  Diagnosis Date   Adenomatous colon polyp 03/2008   Anginal pain ([facility]    Aortic aneurysm, thoracic ([facility] 09/09/2016   4.4 cm by echo 05/2015   Arthritis    "joints in my fingers ache" (07/13/2015)   Bicuspid aortic valve 09/09/2016   Cataract 2019   bilateral eyes   CKD (chronic kidney disease), stage III ([facility]    Coronary artery disease    Diabetes mellitus without complication ([facility] [place]    Hemorrhoids    Hepatocellular carcinoma ([facility]    Hyperlipidemia    Hypertension    Paroxysmal atrial fibrillation ([facility] [place]    Peripheral arterial disease ([facility]    a. dopppler 05/29/2015 revealed high-grade right popliteal and distal left SFA disease b. LE angio 06/28/2015 revealing high grade calcific dx with distal L SFA and R popliteal artery s/p diamondback orbital rotational atherectomy, PTA of L SFA  c. 07/13/2015 diamondback orbital rotational atherectomy and drug eluting balloon angioplasty of R popliteal artery (P2 segment) using distal protection    Type II diabetes mellitus ([facility]    Ulcerative colitis    Past Surgical History:  Procedure Laterality Date   ABDOMINAL AORTOGRAM W/LOWER EXTREMITY N/A 04/27/2018   Procedure: ABDOMINAL AORTOGRAM W/LOWER EXTREMITY;  Surgeon: BLorretta Harp MD;  [facility];  Service: Cardiovascular;  Laterality: N/A;   COLONOSCOPY     COLONOSCOPY W/ POLYPECTOMY  X 4-5   CORONARY ATHERECTOMY N/A 02/15/2021   Procedure: CORONARY ATHERECTOMY;  Surgeon: Lorretta Harp, MD;  Location: [place] [facility];  Service: Cardiovascular;  Laterality: N/A;  aborted    CORONARY BALLOON ANGIOPLASTY N/A 02/15/2021   Procedure: CORONARY BALLOON ANGIOPLASTY;  Surgeon: Lorretta Harp, MD;  Location: [place] [facility];  Service: Cardiovascular;  Laterality: N/A;   COSMETIC SURGERY  < 2000   "removed birthmark from top  of my head"   IR ANGIOGRAM SELECTIVE EACH ADDITIONAL VESSEL  06/08/2021   IR ANGIOGRAM SELECTIVE EACH ADDITIONAL VESSEL  06/08/2021   IR ANGIOGRAM SELECTIVE EACH ADDITIONAL VESSEL  06/08/2021   IR ANGIOGRAM SELECTIVE EACH ADDITIONAL VESSEL  06/08/2021   IR ANGIOGRAM SELECTIVE EACH ADDITIONAL VESSEL  06/21/2021   IR ANGIOGRAM SELECTIVE EACH ADDITIONAL VESSEL  06/21/2021   IR ANGIOGRAM SELECTIVE EACH ADDITIONAL VESSEL  06/21/2021   IR ANGIOGRAM SELECTIVE EACH ADDITIONAL VESSEL  06/21/2021   IR ANGIOGRAM VISCERAL SELECTIVE  06/08/2021   IR ANGIOGRAM VISCERAL SELECTIVE  06/21/2021   IR EMBO ARTERIAL NOT HEMORR HEMANG INC GUIDE ROADMAPPING  06/08/2021   IR EMBO TUMOR ORGAN ISCHEMIA INFARCT INC GUIDE ROADMAPPING  06/21/2021   IR EMBO TUMOR ORGAN ISCHEMIA INFARCT INC GUIDE ROADMAPPING  06/21/2021   IR RADIOLOGIST EVAL & MGMT  05/08/2021   IR RADIOLOGIST EVAL & MGMT  07/26/2021   IR RADIOLOGIST EVAL & MGMT  09/05/2021   IR RADIOLOGIST EVAL & MGMT  12/27/2021   IR US GUIDE VASC ACCESS LEFT  06/21/2021   IR US GUIDE VASC ACCESS RIGHT  06/08/2021   LEFT HEART CATH AND CORONARY ANGIOGRAPHY N/A 02/08/2021   Procedure: LEFT HEART CATH AND CORONARY ANGIOGRAPHY;  Surgeon: Lorretta Harp, MD;  Location: [place] [facility];  Service: Cardiovascular;  Laterality: N/A;   PERIPHERAL VASCULAR ATHERECTOMY  04/27/2018   Procedure: PERIPHERAL VASCULAR ATHERECTOMY;  Surgeon: Lorretta Harp, MD;  Location: [place] [facility];  Service: Cardiovascular;;  right popliteal artery   PERIPHERAL VASCULAR CATHETERIZATION N/A 06/29/2015   Procedure: Lower Extremity Angiography;  Surgeon: Lorretta Harp, MD;  Location: [place] [facility];  Service: Cardiovascular;  Laterality: N/A;   PERIPHERAL VASCULAR CATHETERIZATION N/A 07/13/2015   Procedure: Lower Extremity Angiography;  Surgeon: Lorretta Harp, MD;  Location: [place] [facility];  Service: Cardiovascular;  Laterality: N/A;   PERIPHERAL VASCULAR CATHETERIZATION   07/13/2015   Procedure: Peripheral Vascular Atherectomy;  Surgeon: Lorretta Harp, MD;  Location: [place] [facility];  Service: Cardiovascular;;   PERIPHERAL VASCULAR INTERVENTION  04/27/2018   Procedure: PERIPHERAL VASCULAR INTERVENTION;  Surgeon: Lorretta Harp, MD;  Location: [place] [facility];  Service: Cardiovascular;;  right popliteal artery   POLYPECTOMY     TONSILLECTOMY     UPPER GASTROINTESTINAL ENDOSCOPY      No current facility-administered medications for this encounter.    Allergies  Allergen Reactions   Penicillins Other (See Comments)    Unsure, childhood reaction Has patient had a PCN reaction causing immediate rash, facial/tongue/throat swelling, SOB or lightheadedness with hypotension: Unknown Has patient had a PCN reaction causing severe rash involving mucus membranes or skin necrosis: Unknown Has patient had a PCN reaction that required hospitalization: No Has patient had a PCN reaction occurring within the last 10 years: No If all of the above answers are "NO", then may proceed with Cephalosporin use.  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[employer], academic  Tobacco Use   Smoking status: Former    Packs/day: 1.50    Years: 25.00    Total pack years: 37.50    Types: Cigarettes    Quit date: 08/12/1996    Years since quitting: 26.1   Smokeless tobacco: Never   Tobacco comments:    Former smoker 09/17/2022  Vaping Use   Vaping Use: Never used  Substance and Sexual Activity   Alcohol use: Yes    Alcohol/week: 1.0 standard drink of alcohol    Types: 1 Cans of beer per week    Comment: social use   Drug use: No   Sexual activity: Yes  Other Topics Concern   Not on file  Social History Narrative   ** Merged History  Encounter **       Social Determinants of Health   Financial Resource Strain: Low Risk  (08/17/2021)   Overall Financial Resource Strain (CARDIA)    Difficulty of Paying Living Expenses: Not very hard  Food Insecurity: Not on file  Transportation Needs: Not on file  Physical Activity: Not on file  Stress: Not on file  Social Connections: Not on file  Intimate Partner Violence: Not on file     ROS- All systems are reviewed and negative except as per the HPI above.  Physical Exam: BP: 104/84  Pulse: 94  Weight: 95.2 kg  Height: 5' 10"$  (1.778 m)    GEN- The patient is a well appearing male, alert and oriented x 3 today.   HEENT-head normocephalic, atraumatic, sclera clear, conjunctiva pink, hearing intact, trachea midline. Lungs- Clear to ausculation bilaterally, normal work of breathing Heart- irregular rate and rhythm, no murmurs, rubs or gallops  GI- soft, NT, ND, + BS Extremities- no clubbing, cyanosis, or edema MS- no significant deformity or atrophy Skin- no rash or lesion Psych- euthymic mood, full affect Neuro- strength and sensation are intact   Wt Readings from Last 3 Encounters:  09/24/22 95.2 kg  09/23/22 94.8 kg  09/17/22 95.9 kg    EKG today demonstrates  Afib Vent. rate 94 BPM PR interval * ms QRS duration 82 ms QT/QTcB 386/482 ms  Echo 03/13/21 demonstrated   1. Left ventricular ejection fraction, by estimation, is 60 to 65%. The left ventricle has normal function. The left ventricle has no regional wall motion abnormalities. There is mild left ventricular hypertrophy.  Left ventricular diastolic parameters are consistent with Grade II diastolic dysfunction (pseudonormalization).   2. Right ventricular systolic function is normal. The right ventricular  size is normal.   3. Left atrial size was moderately dilated.   4. Right atrial size was moderately dilated.   5. The mitral valve is normal in structure. Trivial mitral valve  regurgitation. No  evidence of mitral stenosis.   6. The aortic valve is tricuspid. There is moderate calcification of the aortic valve. Aortic valve regurgitation is trivial. Mild aortic valve  stenosis.   7. Aortic dilatation noted. There is mild dilatation of the ascending  aorta, measuring 40 mm.   8. The inferior vena cava is normal in size with greater than 50%  respiratory variability, suggesting right atrial pressure of 3 mmHg.  Epic records are reviewed at length today  CHA2DS2-VASc Score = 4  The patient's score is based upon: CHF History: 0 HTN History: 1 Diabetes History: 1 Stroke History: 0 Vascular Disease History: 1 Age Score: 1 Gender Score: 0        ASSESSMENT AND PLAN: 1. Persistent atrial fibrillation  The patient's CHA2DS2-VASc score is 4, indicating a 4.8% annual risk of stroke.   Patient presents for dofetilide admission. Continue Eliquis 5 mg BID, states no missed doses in the last 3 weeks. No recent benadryl use PharmD has screened medications. Discussed [place] with Dr Curt Bears and Dr Benay Spice. With very limited AAD options and not a candidate for ablation, OK to proceed with dofetilide loading. QT to be monitored closely. Patient also voices understanding and wishes to proceed.  QTc in SR 437 ms Labs today show creatinine at 1.96, K+ 4.2 and mag 2.1, CrCl calculated at 49 mL/min Continue carvedilol 37.5 mg BID  2. Secondary Hypercoagulable State (ICD10:  D68.69) The patient is at significant risk for stroke/thromboembolism based upon his CHA2DS2-VASc Score of 4.  Continue Apixaban (Eliquis).   3. CAD Followed by Dr Gwenlyn Found. No ischemic symptoms.  4. HTN Stable, no changes today.  5. Hepatocellular carcinoma  Followed by Dr Benay Spice  Pt presents for tikosyn loading as above.   Legrand Como [address] [place], [name]  09/24/2022 2:26 PM   I have seen and examined this patient with [name].  Agree with above, note added to reflect my findings.  Patient with a history of  persistent atrial fibrillation.  Has a history of liver cancer as well.  Admission today for dofetilide load.  QTc mildly prolonged on ECG in atrial fibrillation but acceptable in sinus rhythm.  Patient feeling weak and fatigued currently.  GEN: Well nourished, well developed, in no acute distress  HEENT: normal  Neck: no JVD, carotid bruits, or masses Cardiac: irregular; no murmurs, rubs, or gallops,no edema  Respiratory:  clear to auscultation bilaterally, normal work of breathing GI: soft, nontender, nondistended, + BS MS: no deformity or atrophy  Skin: warm and dry Neuro:  Strength and sensation are intact Psych: euthymic mood, full affect   Persistent atrial fibrillation: Plan for dofetilide load.  Patient on [place] for liver cancer which does interact with dofetilide, though QTc is currently acceptable.  Uzair Godley need close follow-up for QTc.  Plan to start to 50 mcg twice daily today.  Plan for cardioversion after the fourth dose if he does not convert to sinus rhythm.  CHA2DS2-VASc of 4.  Continue Eliquis.  Pami Wool M. Kamil Hanigan MD 09/24/2022 4:27 PM

## 2022-09-24 NOTE — Progress Notes (Signed)
Primary Care Physician: Susy Frizzle, MD Primary Cardiologist: Dr Gwenlyn Found Primary Electrophysiologist: Dr Curt Bears (new) Referring Physician: Dr Nada Boozer is a 67 y.o. male with a history of HTN, DM, CAD, CKD, PVD, hepatocellular carcinoma, atrial fibrillation who presents for consultation in the [facility].  He reports being told when having "a test" in July 2022 that he had afib.  He was unaware.  He had an event monitor placed which revealed afib with burden of 25%. His coreg was increased to 37.74m BID. He denies any awareness of his afib. Patient is on Eliquis for a CHADS2VASC score of 4.   ECG at Dr BKennon Holteroffice on 09/10/22 showed he was back in afib. Patient believes he is going in and out of rhythm because his symptoms are intermittent but he is very fatigued when in afib.  On follow up today, patient presents for dofetilide loading. He remains in afib with symptoms of fatigue. He denies any missed doses of anticoagulation.   Today, he denies symptoms of palpitations, chest pain, shortness of breath, orthopnea, PND, lower extremity edema, dizziness, presyncope, syncope, snoring, daytime somnolence, bleeding, or neurologic sequela. The patient is tolerating medications without difficulties and is otherwise without complaint today.    Atrial Fibrillation Risk Factors:  he does not have symptoms or diagnosis of sleep apnea. he does not have a history of rheumatic fever.   he has a BMI of Body mass index is 30.1 kg/m..Marland KitchenFiled Weights   09/24/22 0823  Weight: 95.2 kg    Family History  Problem Relation Age of Onset   Colon cancer Maternal Grandmother        in her 610's  Colon polyps Mother        in her 568's  Diabetes Mother    Heart disease Father    Diabetes Maternal Aunt        Aunts and Uncles   Esophageal cancer Neg Hx    Stomach cancer Neg Hx    Rectal cancer Neg Hx      Atrial Fibrillation Management  history:  Previous antiarrhythmic drugs: none Previous cardioversions: none Previous ablations: none CHADS2VASC score: 4 Anticoagulation history: Eliquis   Past Medical History:  Diagnosis Date   Adenomatous colon polyp 03/2008   Anginal pain ([place]    Aortic aneurysm, thoracic ([place] 09/09/2016   4.4 cm by echo 05/2015   Arthritis    "joints in my fingers ache" (07/13/2015)   Bicuspid aortic valve 09/09/2016   Cataract 2019   bilateral eyes   CKD (chronic kidney disease), stage III ([place]    Coronary artery disease    Diabetes mellitus without complication ([place]    Hemorrhoids    Hepatocellular carcinoma ([facility]    Hyperlipidemia    Hypertension    Paroxysmal atrial fibrillation ([facility]    Peripheral arterial disease ([facility]    a. dopppler 05/29/2015 revealed high-grade right popliteal and distal left SFA disease b. LE angio 06/28/2015 revealing high grade calcific dx with distal L SFA and R popliteal artery s/p diamondback orbital rotational atherectomy, PTA of L SFA  c. 07/13/2015 diamondback orbital rotational atherectomy and drug eluting balloon angioplasty of R popliteal artery (P2 segment) using distal protection    Type II diabetes mellitus ([place]    Ulcerative colitis    Past Surgical History:  Procedure Laterality Date   ABDOMINAL AORTOGRAM W/LOWER EXTREMITY N/A 04/27/2018   Procedure: ABDOMINAL AORTOGRAM W/LOWER EXTREMITY;  Surgeon:  Lorretta Harp, MD;  Location: [place] [facility];  Service: Cardiovascular;  Laterality: N/A;   COLONOSCOPY     COLONOSCOPY W/ POLYPECTOMY  X 4-5   CORONARY ATHERECTOMY N/A 02/15/2021   Procedure: CORONARY ATHERECTOMY;  Surgeon: Lorretta Harp, MD;  Location: [place] [facility];  Service: Cardiovascular;  Laterality: N/A;  aborted    CORONARY BALLOON ANGIOPLASTY N/A 02/15/2021   Procedure: CORONARY BALLOON ANGIOPLASTY;  Surgeon: Lorretta Harp, MD;  Location: [place] [facility];  Service: Cardiovascular;  Laterality: N/A;   COSMETIC  SURGERY  < 2000   "removed birthmark from top of my head"   IR ANGIOGRAM SELECTIVE EACH ADDITIONAL VESSEL  06/08/2021   IR ANGIOGRAM SELECTIVE EACH ADDITIONAL VESSEL  06/08/2021   IR ANGIOGRAM SELECTIVE EACH ADDITIONAL VESSEL  06/08/2021   IR ANGIOGRAM SELECTIVE EACH ADDITIONAL VESSEL  06/08/2021   IR ANGIOGRAM SELECTIVE EACH ADDITIONAL VESSEL  06/21/2021   IR ANGIOGRAM SELECTIVE EACH ADDITIONAL VESSEL  06/21/2021   IR ANGIOGRAM SELECTIVE EACH ADDITIONAL VESSEL  06/21/2021   IR ANGIOGRAM SELECTIVE EACH ADDITIONAL VESSEL  06/21/2021   IR ANGIOGRAM VISCERAL SELECTIVE  06/08/2021   IR ANGIOGRAM VISCERAL SELECTIVE  06/21/2021   IR EMBO ARTERIAL NOT HEMORR HEMANG INC GUIDE ROADMAPPING  06/08/2021   IR EMBO TUMOR ORGAN ISCHEMIA INFARCT INC GUIDE ROADMAPPING  06/21/2021   IR EMBO TUMOR ORGAN ISCHEMIA INFARCT INC GUIDE ROADMAPPING  06/21/2021   IR RADIOLOGIST EVAL & MGMT  05/08/2021   IR RADIOLOGIST EVAL & MGMT  07/26/2021   IR RADIOLOGIST EVAL & MGMT  09/05/2021   IR RADIOLOGIST EVAL & MGMT  12/27/2021   IR US GUIDE VASC ACCESS LEFT  06/21/2021   IR US GUIDE VASC ACCESS RIGHT  06/08/2021   LEFT HEART CATH AND CORONARY ANGIOGRAPHY N/A 02/08/2021   Procedure: LEFT HEART CATH AND CORONARY ANGIOGRAPHY;  Surgeon: Lorretta Harp, MD;  Location: [place] [facility];  Service: Cardiovascular;  Laterality: N/A;   PERIPHERAL VASCULAR ATHERECTOMY  04/27/2018   Procedure: PERIPHERAL VASCULAR ATHERECTOMY;  Surgeon: Lorretta Harp, MD;  Location: [place] [facility];  Service: Cardiovascular;;  right popliteal artery   PERIPHERAL VASCULAR CATHETERIZATION N/A 06/29/2015   Procedure: Lower Extremity Angiography;  Surgeon: Lorretta Harp, MD;  Location: [place] [facility];  Service: Cardiovascular;  Laterality: N/A;   PERIPHERAL VASCULAR CATHETERIZATION N/A 07/13/2015   Procedure: Lower Extremity Angiography;  Surgeon: Lorretta Harp, MD;  Location: [place] [facility];  Service: Cardiovascular;   Laterality: N/A;   PERIPHERAL VASCULAR CATHETERIZATION  07/13/2015   Procedure: Peripheral Vascular Atherectomy;  Surgeon: Lorretta Harp, MD;  Location: [place] [facility];  Service: Cardiovascular;;   PERIPHERAL VASCULAR INTERVENTION  04/27/2018   Procedure: PERIPHERAL VASCULAR INTERVENTION;  Surgeon: Lorretta Harp, MD;  Location: [place] [facility];  Service: Cardiovascular;;  right popliteal artery   POLYPECTOMY     TONSILLECTOMY     UPPER GASTROINTESTINAL ENDOSCOPY      Current Outpatient Medications  Medication Sig Dispense Refill   acetaminophen (TYLENOL) 500 MG tablet Take 1 tablet (500 mg total) by mouth every 6 (six) hours as needed. 30 tablet 0   apixaban (ELIQUIS) 5 MG TABS tablet Take 1 tablet (5 mg total) by mouth 2 (two) times daily. 180 tablet 3   aspirin EC 81 MG tablet Take 1 tablet (81 mg total) by mouth daily. 90 tablet 3   atorvastatin (LIPITOR) 80 MG tablet TAKE 1 TABLET BY MOUTH EVERY DAY  90 tablet 3   carvedilol (COREG) 12.5 MG tablet TAKE 3 TABLETS (37.5 MG) BY MOUTH 2 TIMES DAILY 180 tablet 2   cyanocobalamin (VITAMIN B12) 1000 MCG tablet Take 1,000 mcg by mouth daily.     Dulaglutide (TRULICITY) 3 0000000 SOPN Inject 3 mg as directed once a week. 4 mL 1   fenofibrate 160 MG tablet Take 1 tablet (160 mg total) by mouth daily. 90 tablet 3   glucose blood (ONETOUCH ULTRA) test strip CHECK BLOOD SUGAR TWICE A DAY 50 strip 15   HYDROcodone-acetaminophen (NORCO) 7.5-325 MG tablet Take 1 tablet by mouth every 6 (six) hours as needed for moderate pain. 30 tablet 0   Insulin Pen Needle (B-D UF III MINI PEN NEEDLES) 31G X 5 MM MISC Use daily with Victoza 100 each 11   JARDIANCE 25 MG TABS tablet TAKE 1 TABLET BY MOUTH DAILY BEFORE BREAKFAST. 90 tablet 0   lenvatinib 12 mg daily dose (LENVIMA) 3 x 4 MG capsule Take 3 capsules (12 mg total) by mouth daily. 90 capsule 0   lisinopril (ZESTRIL) 20 MG tablet Take 1 tablet (20 mg total) by mouth daily. 90 tablet 3   Omega-3  Fatty Acids (FISH OIL) 1000 MG CAPS Take 1,000 mg by mouth 2 (two) times daily.     pantoprazole (PROTONIX) 40 MG tablet Take 1 tablet (40 mg total) by mouth 2 (two) times daily. 60 tablet 11   pioglitazone (ACTOS) 30 MG tablet TAKE 1 TABLET BY MOUTH EVERY DAY. STRENGTH: 30 MG 90 tablet 1   sildenafil (VIAGRA) 100 MG tablet Take 0.5-1 tablets (50-100 mg total) by mouth daily as needed for erectile dysfunction. 5 tablet 11   vedolizumab (ENTYVIO) 300 MG injection Inject 300 mg as directed See admin instructions. Every 2 months     No current facility-administered medications for this encounter.    Allergies  Allergen Reactions   Penicillins Other (See Comments)    Unsure, childhood reaction Has patient had a PCN reaction causing immediate rash, facial/tongue/throat swelling, SOB or lightheadedness with hypotension: Unknown Has patient had a PCN reaction causing severe rash involving mucus membranes or skin necrosis: Unknown Has patient had a PCN reaction that required hospitalization: No Has patient had a PCN reaction occurring within the last 10 years: No If all of the above answers are "NO", then may proceed with Cephalosporin use.     Social History   Socioeconomic History   Marital status: Married    Spouse name: Jais Rubi   Number of children: 2   Years of education: Not on file   Highest education level: Not on file  Occupational History   Occupation: Truck Education administrator: Counsellor of Librarian, academic  Tobacco Use   Smoking status: Former    Packs/day: 1.50    Years: 25.00    Total pack years: 37.50    Types: Cigarettes    Quit date: 08/12/1996    Years since quitting: 26.1   Smokeless tobacco: Never   Tobacco comments:    Former smoker 09/17/2022  Vaping Use   Vaping Use: Never used  Substance and Sexual Activity   Alcohol use: Yes    Alcohol/week: 1.0 standard drink of alcohol    Types: 1 Cans of beer per week    Comment: social use   Drug use: No   Sexual  activity: Yes  Other Topics Concern   Not on file  Social History Narrative   ** Merged History Encounter **  Social Determinants of Health   Financial Resource Strain: Low Risk  (08/17/2021)   Overall Financial Resource Strain (CARDIA)    Difficulty of Paying Living Expenses: Not very hard  Food Insecurity: Not on file  Transportation Needs: Not on file  Physical Activity: Not on file  Stress: Not on file  Social Connections: Not on file  Intimate Partner Violence: Not on file     ROS- All systems are reviewed and negative except as per the HPI above.  Physical Exam: Vitals:   09/24/22 0823  BP: 104/84  Pulse: 94  Weight: 95.2 kg  Height: 5' 10"$  (1.778 m)     GEN- The patient is a well appearing male, alert and oriented x 3 today.   HEENT-head normocephalic, atraumatic, sclera clear, conjunctiva pink, hearing intact, trachea midline. Lungs- Clear to ausculation bilaterally, normal work of breathing Heart- irregular rate and rhythm, no murmurs, rubs or gallops  GI- soft, NT, ND, + BS Extremities- no clubbing, cyanosis, or edema MS- no significant deformity or atrophy Skin- no rash or lesion Psych- euthymic mood, full affect Neuro- strength and sensation are intact   Wt Readings from Last 3 Encounters:  09/24/22 95.2 kg  09/23/22 94.8 kg  09/17/22 95.9 kg    EKG today demonstrates  Afib Vent. rate 94 BPM PR interval * ms QRS duration 82 ms QT/QTcB 386/482 ms  Echo 03/13/21 demonstrated   1. Left ventricular ejection fraction, by estimation, is 60 to 65%. The left ventricle has normal function. The left ventricle has no regional wall motion abnormalities. There is mild left ventricular hypertrophy.  Left ventricular diastolic parameters are consistent with Grade II diastolic dysfunction (pseudonormalization).   2. Right ventricular systolic function is normal. The right ventricular  size is normal.   3. Left atrial size was moderately dilated.   4. Right  atrial size was moderately dilated.   5. The mitral valve is normal in structure. Trivial mitral valve  regurgitation. No evidence of mitral stenosis.   6. The aortic valve is tricuspid. There is moderate calcification of the aortic valve. Aortic valve regurgitation is trivial. Mild aortic valve  stenosis.   7. Aortic dilatation noted. There is mild dilatation of the ascending  aorta, measuring 40 mm.   8. The inferior vena cava is normal in size with greater than 50%  respiratory variability, suggesting right atrial pressure of 3 mmHg.   Epic records are reviewed at length today  CHA2DS2-VASc Score = 4  The patient's score is based upon: CHF History: 0 HTN History: 1 Diabetes History: 1 Stroke History: 0 Vascular Disease History: 1 Age Score: 1 Gender Score: 0        ASSESSMENT AND PLAN: 1. Persistent atrial fibrillation  The patient's CHA2DS2-VASc score is 4, indicating a 4.8% annual risk of stroke.   Patient presents for dofetilide admission. Continue Eliquis 5 mg BID, states no missed doses in the last 3 weeks. No recent benadryl use PharmD has screened medications. Discussed Lenvima with Dr Curt Bears and Dr Benay Spice. With very limited AAD options and not a candidate for ablation, OK to proceed with dofetilide loading. QT to be monitored closely. Patient also voices understanding and wishes to proceed.  QTc in SR 437 ms Labs today show creatinine at 1.96, K+ 4.2 and mag 2.1, CrCl calculated at 49 mL/min Continue carvedilol 37.5 mg BID  2. Secondary Hypercoagulable State (ICD10:  D68.69) The patient is at significant risk for stroke/thromboembolism based upon his CHA2DS2-VASc Score of 4.  Continue Apixaban (Eliquis).   3. CAD Followed by Dr Gwenlyn Found. No ischemic symptoms.  4. HTN Stable, no changes today.  5. Hepatocellular carcinoma  Followed by Dr Benay Spice   To be admitted today once a bed becomes available.    [facility] [address] [place], [place] [address] [phone] 09/24/2022 8:52 AM

## 2022-09-24 NOTE — Progress Notes (Signed)
Pharmacy: Dofetilide (Tikosyn) - Initial Consult Assessment and Electrolyte Replacement  Pharmacy consulted to assist in monitoring and replacing electrolytes in this 67 y.o. male admitted on 09/24/2022 undergoing dofetilide initiation. First dofetilide dose scheduled for 2/13 PM.  Assessment:  Patient Exclusion Criteria: If any screening criteria checked as "Yes", then  patient  should NOT receive dofetilide until criteria item is corrected.  If "Yes" please indicate correction plan.  YES  NO Patient  Exclusion Criteria Correction Plan   [x]$   []$   Baseline QTc interval is greater than or equal to 440 msec. IF above YES box checked dofetilide contraindicated unless patient has ICD; then may proceed if QTc 500-550 msec or with known ventricular conduction abnormalities may proceed with QTc 550-600 msec.  QTc = 482 msec per 2/13 EKG but closer to 460 msec by manual measurement according to EP. Discussed with EP and okay to proceed.    []$   [x]$   Patient is known or suspected to have a digoxin level greater than 2 ng/ml: No results found for: "DIGOXIN"     []$   [x]$   Creatinine clearance less than 20 ml/min (calculated using Cockcroft-Gault, actual body weight and serum creatinine): Estimated Creatinine Clearance: 42.4 mL/min (A) (by C-G formula based on SCr of 1.96 mg/dL (H)).     [x]$   []$  Patient has received drugs known to prolong the QT intervals within the last 48 hours (phenothiazines, tricyclics or tetracyclic antidepressants, erythromycin, H-1 antihistamines, cisapride, fluoroquinolones, azithromycin, ondansetron).   Updated information on QT prolonging agents is available to be searched on the following database:QT prolonging agents  Patient is on lenvatinib outpatient for treatment of progressive [place] with his last dose being 2/13 AM. This medication has been reviewed and discussed with EP--plan to proceed with dofetilide loading and monitor Qtc closely.    []$   [x]$   Patient  received a dose of hydrochlorothiazide (Oretic) alone or in any combination including triamterene (Dyazide, Maxzide) in the last 48 hours.    []$   [x]$  Patient received a medication known to increase dofetilide plasma concentrations prior to initial dofetilide dose:  Trimethoprim (Primsol, Proloprim) in the last 36 hours Verapamil (Calan, Verelan) in the last 36 hours or a sustained release dose in the last 72 hours Megestrol (Megace) in the last 5 days  Cimetidine (Tagamet) in the last 6 hours Ketoconazole (Nizoral) in the last 24 hours Itraconazole (Sporanox) in the last 48 hours  Prochlorperazine (Compazine) in the last 36 hours     []$   [x]$   Patient is known to have a history of torsades de pointes; congenital or acquired long QT syndromes.    []$   [x]$   Patient has received a Class 1 antiarrhythmic with less than 2 half-lives since last dose. (Disopyramide, Quinidine, Procainamide, Lidocaine, Mexiletine, Flecainide, Propafenone)    []$   [x]$   Patient has received amiodarone therapy in the past 3 months or amiodarone level is greater than 0.3 ng/ml.    Labs:    Component Value Date/Time   K 4.2 09/24/2022 0840   MG 2.1 09/24/2022 0840     Plan: [facility] One Calculated CrCl  Dose q12h  []$  > 60 ml/min 500 mcg  [x]$  40-60 ml/min 250 mcg  []$  20-40 ml/min 125 mcg   [x]$   Physician selected initial dose within range recommended for patients level of renal function - will monitor for response.  []$   Physician selected initial dose outside of range recommended for patients level of renal function - will discuss if the dose should  be altered at this time.   Patient is being appropriately anticoagulated with apixaban 75m BID.   Potassium: K >/= 4: Appropriate to initiate Tikosyn, no replacement needed    Magnesium: Mg >2: Appropriate to initiate Tikosyn, no replacement needed     Thank you for allowing pharmacy to participate in this patient's care   SBilley Gosling PharmD PGY1  Pharmacy Resident 2/13/20243:19 PM

## 2022-09-24 NOTE — Progress Notes (Signed)
Prior-To-Admission Oral Chemotherapy for Treatment of Oncologic Disease   Order noted from Dr. Benay Spice to continue prior-to-admission oral chemotherapy regimen of lenvatinib.  Procedure Per Pharmacy & Therapeutics Committee Policy: Orders for continuation of home oral chemotherapy for treatment of an oncologic disease will be held unless approved by an oncologist during current admission.    For patients receiving oncology care at [facility], inpatient pharmacist contacts patient's oncologist during regular office hours to review. If earlier review is medically necessary, attending physician consults [facility] on-call oncologist   For patients receiving oncology care outside of [facility], attending physician consults patient's oncologist to review. If this oncologist or their coverage cannot be reached, attending physician consults [facility] on-call oncologist   Oral chemotherapy continuation order is on hold pending oncologist review, [facility] oncologist Dr Benay Spice will be notified by inpatient pharmacy during office hours    Antonietta Jewel, PharmD, [place] Pharmacist  Phone: [phone] 09/24/2022 4:51 PM  Please check AMION for all [place] phone numbers After 10:00 PM, call [place] [phone]

## 2022-09-25 ENCOUNTER — Other Ambulatory Visit (HOSPITAL_COMMUNITY): Payer: Self-pay

## 2022-09-25 ENCOUNTER — Telehealth (HOSPITAL_COMMUNITY): Payer: Self-pay

## 2022-09-25 DIAGNOSIS — I4819 Other persistent atrial fibrillation: Secondary | ICD-10-CM | POA: Diagnosis not present

## 2022-09-25 LAB — BASIC METABOLIC PANEL
Anion gap: 10 (ref 5–15)
BUN: 22 mg/dL (ref 8–23)
CO2: 23 mmol/L (ref 22–32)
Calcium: 8.2 mg/dL — ABNORMAL LOW (ref 8.9–10.3)
Chloride: 105 mmol/L (ref 98–111)
Creatinine, Ser: 1.96 mg/dL — ABNORMAL HIGH (ref 0.61–1.24)
GFR, Estimated: 37 mL/min — ABNORMAL LOW (ref >60)
Glucose, Bld: 95 mg/dL (ref 70–99)
Potassium: 4.2 mmol/L (ref 3.5–5.1)
Sodium: 138 mmol/L (ref 135–145)

## 2022-09-25 LAB — MAGNESIUM: Magnesium: 2 mg/dL (ref 1.7–2.4)

## 2022-09-25 LAB — GLUCOSE, CAPILLARY
Glucose-Capillary: 115 mg/dL — ABNORMAL HIGH (ref 70–99)
Glucose-Capillary: 130 mg/dL — ABNORMAL HIGH (ref 70–99)
Glucose-Capillary: 137 mg/dL — ABNORMAL HIGH (ref 70–99)
Glucose-Capillary: 175 mg/dL — ABNORMAL HIGH (ref 70–99)

## 2022-09-25 LAB — HIV ANTIBODY (ROUTINE TESTING W REFLEX): HIV Screen 4th Generation wRfx: NONREACTIVE

## 2022-09-25 MED ORDER — MAGNESIUM SULFATE 2 GM/50ML IV SOLN
2.0000 g | Freq: Once | INTRAVENOUS | Status: AC
  Administered 2022-09-25: 2 g via INTRAVENOUS
  Filled 2022-09-25: qty 50

## 2022-09-25 NOTE — Progress Notes (Signed)
Notified by CCMD Pt converted to SR 1st degree HB. Upon review of telemetry, pt converted at [place]. Will obtain EKG to confirm. Plan of care ongoing. Jessie Foot, RN

## 2022-09-25 NOTE — TOC Benefit Eligibility Note (Signed)
Patient Teacher, English as a foreign language completed.    The patient is currently admitted and upon discharge could be taking Tikosyn 125MCG.  The current 30 day co-pay is $0.00.   The patient is insured through [employer]

## 2022-09-25 NOTE — Telephone Encounter (Signed)
Pharmacy Patient Advocate Encounter  Insurance verification completed.    The patient is insured through [facility]   The patient is currently admitted and ran test claims for the following: $0.00.  Copays and coinsurance results were relayed to Inpatient clinical team.

## 2022-09-25 NOTE — Progress Notes (Signed)
Morning EKG reviewed     Shows remains in NSR at 62 bpm with stable QTc at ~480 ms.  Continue  Tikosyn 250 mcg BID.   Potassium4.2 (02/14 0228) Magnesium  2.0 (02/14 0228) Creatinine, ser  1.96* (02/14 0228)  Plan for home Friday if QTc remains stable   [name], [place]  09/25/2022 10:46 AM

## 2022-09-25 NOTE — Progress Notes (Signed)
Pharmacy: Dofetilide (Tikosyn) - Follow Up Assessment and Electrolyte Replacement  Pharmacy consulted to assist in monitoring and replacing electrolytes in this 67 y.o. male admitted on 09/24/2022 undergoing dofetilide initiation. First dofetilide dose: 2/14@2019$ .  Labs:    Component Value Date/Time   K 4.2 09/25/2022 0228   MG 2.0 09/25/2022 0228     Plan: Potassium: K >/= 4: No additional supplementation needed  Magnesium: Mg 1.8-2: Give Mg 2 gm IV x1    Thank you for allowing pharmacy to participate in this patient's care   Antonietta Jewel, PharmD, [place] Pharmacist  Phone: [phone] 09/25/2022 7:25 AM  Please check AMION for all [place] phone numbers After 10:00 PM, call [place] [phone]

## 2022-09-25 NOTE — Progress Notes (Addendum)
Electrophysiology Rounding Note  Patient Name: Matthew Chen Date of Encounter: 09/25/2022  Primary Cardiologist: Quay Burow, MD  Electrophysiologist: None    Subjective   Pt converted to sinus rhythm on Tikosyn 250 mcg BID   QTc from EKG last pm shows stable QTc at ~480  The patient is doing well today.  At this time, the patient denies chest pain, shortness of breath, or any new concerns.  Inpatient Medications    Scheduled Meds:  apixaban  5 mg Oral BID   aspirin EC  81 mg Oral Daily   atorvastatin  80 mg Oral Daily   carvedilol  37.5 mg Oral BID WC   cyanocobalamin  1,000 mcg Oral Daily   dofetilide  250 mcg Oral BID   [START ON 09/26/2022] empagliflozin  25 mg Oral QAC breakfast   fenofibrate  160 mg Oral Daily   insulin aspart  0-9 Units Subcutaneous TID WC   lenvatinib 12 mg daily dose  12 mg Oral Daily   lisinopril  20 mg Oral Daily   pantoprazole  40 mg Oral BID   pioglitazone  30 mg Oral Daily   sodium chloride flush  3 mL Intravenous Q12H   Continuous Infusions:  sodium chloride     magnesium sulfate bolus IVPB     PRN Meds: sodium chloride, HYDROcodone-acetaminophen, sodium chloride flush   Vital Signs    Vitals:   09/24/22 1644 09/24/22 2015 09/25/22 0327 09/25/22 0341  BP: (!) 132/101 134/88 (!) 142/90 (!) 148/97  Pulse: 89   77  Resp:  14 14 16  $ Temp:  98.1 F (36.7 C) 98.3 F (36.8 C) 98.3 F (36.8 C)  TempSrc:  Oral Oral Oral  SpO2:    97%  Weight:      Height:       No intake or output data in the 24 hours ending 09/25/22 0740 Filed Weights   09/24/22 1440 09/24/22 1500  Weight: 95.2 kg 92.5 kg    Physical Exam    GEN- NAD, A&O x 3. Normal affect.  Lungs- CTAB, Normal effort.  Heart- Regular rate and rhythm. No M/G/R GI- Soft, NT, ND Extremities- No clubbing, cyanosis, or edema Skin- no rash or lesion  Labs    CBC Recent Labs    09/23/22 0809  WBC 5.5  NEUTROABS 3.3  HGB 14.6  HCT 48.0  MCV 81.8  PLT [id number]    Basic Metabolic Panel Recent Labs    09/24/22 0840 09/25/22 0228  NA 138 138  K 4.2 4.2  CL 103 105  CO2 25 23  GLUCOSE 219* 95  BUN 22 22  CREATININE 1.96* 1.96*  CALCIUM 8.8* 8.2*  MG 2.1 2.0    Telemetry    NSR 70-80s (personally reviewed)  Patient Profile     Matthew Chen is a 67 y.o. male with a past medical history significant for persistent atrial fibrillation.  They were admitted for tikosyn load.   Assessment & Plan    Persistent atrial fibrillation Pt converted to sinus rhythm on Tikosyn 250 mcg BID  Continue Eliquis Creatinine, ser  1.96* (02/14 0228) Magnesium  2.0 (02/14 0228) Potassium4.2 (02/14 0228) Supplement Mg  Plan for home Friday if QTc remains stable.  2. Hepatocellular CA Genny Caulder need to follow QT closely with lenvatinib use.   For questions or updates, please contact [place] Please consult [email or web address] for contact info under Cardiology/STEMI.  Signed, [name], [name]  09/25/2022, 7:40 AM  I have seen and examined this patient with [name].  Agree with above, note added to reflect my findings.  Patient converted to sinus rhythm overnight.  Feeling well without complaint.  GEN: Well nourished, well developed, in no acute distress  HEENT: normal  Neck: no JVD, carotid bruits, or masses Cardiac: RRR; no murmurs, rubs, or gallops,no edema  Respiratory:  clear to auscultation bilaterally, normal work of breathing GI: soft, nontender, nondistended, + BS MS: no deformity or atrophy  Skin: warm and dry Neuro:  Strength and sensation are intact Psych: euthymic mood, full affect   Persistent atrial fibrillation: Continue dofetilide at current dose.  QTc remains acceptable.  If he remains in sinus rhythm Elester Apodaca cancel cardioversion for tomorrow.  Otherwise no acute changes.  Continue Eliquis.  Illona Bulman M. Mathias Bogacki MD 09/25/2022 2:59 PM

## 2022-09-26 DIAGNOSIS — I4819 Other persistent atrial fibrillation: Secondary | ICD-10-CM | POA: Diagnosis not present

## 2022-09-26 LAB — GLUCOSE, CAPILLARY
Glucose-Capillary: 156 mg/dL — ABNORMAL HIGH (ref 70–99)
Glucose-Capillary: 106 mg/dL — ABNORMAL HIGH (ref 70–99)
Glucose-Capillary: 87 mg/dL (ref 70–99)
Glucose-Capillary: 167 mg/dL — ABNORMAL HIGH (ref 70–99)

## 2022-09-26 LAB — BASIC METABOLIC PANEL WITH GFR
Anion gap: 11 (ref 5–15)
BUN: 24 mg/dL — ABNORMAL HIGH (ref 8–23)
CO2: 23 mmol/L (ref 22–32)
Calcium: 8.7 mg/dL — ABNORMAL LOW (ref 8.9–10.3)
Chloride: 103 mmol/L (ref 98–111)
Creatinine, Ser: 1.77 mg/dL — ABNORMAL HIGH (ref 0.61–1.24)
GFR, Estimated: 42 mL/min — ABNORMAL LOW
Glucose, Bld: 143 mg/dL — ABNORMAL HIGH (ref 70–99)
Potassium: 4.3 mmol/L (ref 3.5–5.1)
Sodium: 137 mmol/L (ref 135–145)

## 2022-09-26 LAB — MAGNESIUM: Magnesium: 2.3 mg/dL (ref 1.7–2.4)

## 2022-09-26 MED ORDER — CARVEDILOL 25 MG PO TABS
25.0000 mg | ORAL_TABLET | Freq: Two times a day (BID) | ORAL | Status: DC
  Administered 2022-09-26: 25 mg via ORAL
  Filled 2022-09-26: qty 1

## 2022-09-26 MED ORDER — CARVEDILOL 12.5 MG PO TABS: 12.5000 mg | ORAL_TABLET | Freq: Two times a day (BID) | ORAL | Status: DC

## 2022-09-26 MED ORDER — CARVEDILOL 12.5 MG PO TABS
12.5000 mg | ORAL_TABLET | Freq: Two times a day (BID) | ORAL | Status: DC
  Administered 2022-09-26 – 2022-09-28 (×4): 12.5 mg via ORAL
  Filled 2022-09-26 (×4): qty 1

## 2022-09-26 MED ORDER — ALUM & MAG HYDROXIDE-SIMETH 200-200-20 MG/5ML PO SUSP
30.0000 mL | Freq: Once | ORAL | Status: AC
  Administered 2022-09-26: 30 mL via ORAL
  Filled 2022-09-26: qty 30

## 2022-09-26 MED ORDER — DOFETILIDE 125 MCG PO CAPS
125.0000 ug | ORAL_CAPSULE | Freq: Two times a day (BID) | ORAL | Status: DC
  Filled 2022-09-26: qty 1

## 2022-09-26 NOTE — Progress Notes (Signed)
Patient's HR mid 40s to low 50's, spoke to [facility] PA, he stated to decrease coreg dose from 37.5 mg BID to 25 mg BID, also stated ok to give dose now.

## 2022-09-26 NOTE — Progress Notes (Addendum)
Electrophysiology Rounding Note  Patient Name: Matthew Chen Date of Encounter: 09/26/2022  Primary Cardiologist: Quay Burow, MD  Electrophysiologist: None    Subjective   Pt remains in NSR on Tikosyn 250 mcg BID   QTc from EKG last pm shows  borderline QTc  at ~490-500 ms  The patient is doing well today.  At this time, the patient denies chest pain, shortness of breath, or any new concerns.  Inpatient Medications    Scheduled Meds:  apixaban  5 mg Oral BID   aspirin EC  81 mg Oral Daily   atorvastatin  80 mg Oral Daily   carvedilol  37.5 mg Oral BID WC   cyanocobalamin  1,000 mcg Oral Daily   dofetilide  250 mcg Oral BID   empagliflozin  25 mg Oral QAC breakfast   fenofibrate  160 mg Oral Daily   insulin aspart  0-9 Units Subcutaneous TID WC   lenvatinib 12 mg daily dose  12 mg Oral Daily   lisinopril  20 mg Oral Daily   pantoprazole  40 mg Oral BID   pioglitazone  30 mg Oral Daily   sodium chloride flush  3 mL Intravenous Q12H   Continuous Infusions:  sodium chloride     PRN Meds: sodium chloride, HYDROcodone-acetaminophen, sodium chloride flush   Vital Signs    Vitals:   09/25/22 1202 09/25/22 1652 09/25/22 2013 09/26/22 0618  BP: (!) 131/91 (!) 153/93 (!) 153/84 (!) 169/98  Pulse: (!) 56 (!) 55 (!) 55 67  Resp: 15     Temp: 97.8 F (36.6 C)  97.8 F (36.6 C) 97.9 F (36.6 C)  TempSrc: Oral  Oral Axillary  SpO2: 97%  93% 95%  Weight:      Height:        Intake/Output Summary (Last 24 hours) at 09/26/2022 [id number] Last data filed at 09/25/2022 2000 Gross per 24 hour  Intake 290.03 ml  Output --  Net 290.03 ml   Filed Weights   09/24/22 1440 09/24/22 1500  Weight: 95.2 kg 92.5 kg    Physical Exam    GEN- NAD, A&O x 3. Normal affect.  Lungs- CTAB, Normal effort.  Heart- Regular rate and rhythm. No M/G/R GI- Soft, NT, ND Extremities- No clubbing, cyanosis, or edema Skin- no rash or lesion  Labs    CBC Recent Labs    09/23/22 0809   WBC 5.5  NEUTROABS 3.3  HGB 14.6  HCT 48.0  MCV 81.8  PLT [id number]   Basic Metabolic Panel Recent Labs    09/25/22 0228 09/26/22 0313  NA 138 137  K 4.2 4.3  CL 105 103  CO2 23 23  GLUCOSE 95 143*  BUN 22 24*  CREATININE 1.96* 1.77*  CALCIUM 8.2* 8.7*  MG 2.0 2.3    Telemetry    Sinus brady/NSR 50-60s (personally reviewed)  Patient Profile     Matthew Chen is a 67 y.o. male with a past medical history significant for persistent atrial fibrillation.  They were admitted for tikosyn load.   Assessment & Plan    Persistent atrial fibrillation Pt remains in NSR on Tikosyn 250 mcg BID  QT is borderline. Farrel Guimond discuss with MD re: appropriate am dose.  Continue Eliquis Creatinine, ser  1.77* (02/15 0313) Magnesium  2.3 (02/15 0313) Potassium4.3 (02/15 0313) No electrolyte supplementation needed  Plan for home Friday if QTc remains stable.  2. Abdominal pain Non specific.   Have not seen this before with  Tikosyn, so wonder if symptom of his CA, but he states this would be a new symptoms for him.  Maksym Pfiffner discuss with MD.    For questions or updates, please contact [place] Please consult [email or web address] for contact info under Cardiology/STEMI.  Signed, [name], [name]  09/26/2022, 7:12 AM   I have seen and examined this patient with [name].  Agree with above, note added to reflect my findings.  Remains in sinus rhythm.  QTc significantly prolonged.  GEN: Well nourished, well developed, in no acute distress  HEENT: normal  Neck: no JVD, carotid bruits, or masses Cardiac: RRR; no murmurs, rubs, or gallops,no edema  Respiratory:  clear to auscultation bilaterally, normal work of breathing GI: soft, nontender, nondistended, + BS MS: no deformity or atrophy  Skin: warm and dry Neuro:  Strength and sensation are intact Psych: euthymic mood, full affect   Persistent atrial fibrillation: QTc became significantly prolonged after the morning dose.   Khaleem Burchill hold the dose tonight.  Havard Radigan resume at 125 mcg tomorrow.  If QTc remains prolonged, this is likely a failure of Tikosyn.  Rashae Rother need an alternative rhythm control agent.  Jakyra Kenealy M. Dawaun Brancato MD 09/26/2022 4:06 PM

## 2022-09-26 NOTE — Progress Notes (Signed)
QT remains significantly prolonged on recheck EKG at 1500.  Hold evening dose.  Will get EKG tomorrow am and regroup.  Legrand Como [address] [place], [place]

## 2022-09-26 NOTE — Progress Notes (Signed)
Student Nurse Gershon Mussel noted that patient's BP dropped after ambulation.  RN asked NT to obtain orthostatic vials.    Lying  BP= 137/84 HR= 48  Sitting BP= 122/77 HR= 51  Standing BP= 106/67 HR=  55  3 minutes standing BP= 107/73 HR= 56   Patient denies any dizziness.   Spoke to [employer] PA.  He stated to decrease coreg to 12.5 mg BID. Order changed in EPIC.

## 2022-09-26 NOTE — Progress Notes (Signed)
Morning EKG reviewed     Shows remains in NSR with prolonged QTc > 500 ms.  For now, will plan repeat EKG at 1500 and dose of Tikosyn 125 mcg BID this evening.   If QT remains prolonged, may need to hold dose tonight.   Potassium4.3 (02/15 0313) Magnesium  2.3 (02/15 0313) Creatinine, ser  1.77* (02/15 0313)  Plan for home Friday if QTc remains stabilizes.   [name], [name]  09/26/2022 12:23 PM

## 2022-09-26 NOTE — Care Management (Signed)
09-26-22 Case Manager spoke with the patient regarding zero co pay cost. Patient would like to have the initial Rx filled via [place] and the Rx refills 90 day supply escribed  to [place]. No further needs identified at this time.

## 2022-09-26 NOTE — Progress Notes (Signed)
   09/26/22 0823  Assess: MEWS Score  Temp 97.6 F (36.4 C)  BP (!) 160/85  Pulse Rate (!) 48  Resp 12  Level of Consciousness Alert  Assess: MEWS Score  MEWS Temp 0  MEWS Systolic 0  MEWS Pulse 1  MEWS RR 1  MEWS LOC 0  MEWS Score 2  MEWS Score Color Yellow  Provider Notification  Provider Name/Title andy tillery pa  Date Provider Notified 09/26/22  Time Provider Notified [phone]  Method of Notification Call (spoke to andy via phone about HR, notified via secure chat of mews color change)  Notification Reason Other (Comment) (mews color change)  Provider response See new orders ([place] order recieved to decrease BB to '25mg'$  BID, per Jonni Sanger ok to give AM dose)  Date of Provider Response 09/26/22  Time of Provider Response 0859  Assess: SIRS CRITERIA  SIRS Temperature  0  SIRS Pulse 0  SIRS Respirations  0  SIRS WBC 0  SIRS Score Sum  0

## 2022-09-27 DIAGNOSIS — I4819 Other persistent atrial fibrillation: Secondary | ICD-10-CM | POA: Diagnosis not present

## 2022-09-27 LAB — GLUCOSE, CAPILLARY
Glucose-Capillary: 185 mg/dL — ABNORMAL HIGH (ref 70–99)
Glucose-Capillary: 129 mg/dL — ABNORMAL HIGH (ref 70–99)
Glucose-Capillary: 67 mg/dL — ABNORMAL LOW (ref 70–99)
Glucose-Capillary: 136 mg/dL — ABNORMAL HIGH (ref 70–99)
Glucose-Capillary: 185 mg/dL — ABNORMAL HIGH (ref 70–99)

## 2022-09-27 LAB — BASIC METABOLIC PANEL
Anion gap: 9 (ref 5–15)
BUN: 21 mg/dL (ref 8–23)
CO2: 27 mmol/L (ref 22–32)
Calcium: 8.3 mg/dL — ABNORMAL LOW (ref 8.9–10.3)
Chloride: 102 mmol/L (ref 98–111)
Creatinine, Ser: 1.84 mg/dL — ABNORMAL HIGH (ref 0.61–1.24)
GFR, Estimated: 40 mL/min — ABNORMAL LOW (ref >60)
Glucose, Bld: 93 mg/dL (ref 70–99)
Potassium: 4 mmol/L (ref 3.5–5.1)
Sodium: 138 mmol/L (ref 135–145)

## 2022-09-27 LAB — MAGNESIUM: Magnesium: 2.3 mg/dL (ref 1.7–2.4)

## 2022-09-27 MED ORDER — INSULIN ASPART 100 UNIT/ML IJ SOLN
0.0000 [IU] | Freq: Three times a day (TID) | INTRAMUSCULAR | Status: DC
  Administered 2022-09-27: 1 [IU] via SUBCUTANEOUS
  Administered 2022-09-28: 2 [IU] via SUBCUTANEOUS

## 2022-09-27 NOTE — Progress Notes (Signed)
  Afternoon EKG with persistent QT prolongation, though somewhat improved  Continue to hold tikosyn.   Discussed with Dr. Curt Bears.   If continues to improve and is within reasonable range tomorrow am, could consider attempting dose of Tikosyn 125 mcg (would likely need to continue to monitor for several doses)  If QT remains outside of appropriate range for Tikosyn, would discharge with close EP follow up, as well as close Heme/Onc f/u to consider alternative chemo-agent.   Legrand Como [address] [place], [name]  09/27/2022 2:02 PM

## 2022-09-27 NOTE — Progress Notes (Deleted)
Pharmacy: Dofetilide (Tikosyn) - Follow Up Assessment and Electrolyte Replacement  Pharmacy consulted to assist in monitoring and replacing electrolytes in this 67 y.o. male admitted on 09/24/2022 undergoing dofetilide initiation. First dofetilide dose: 2/14@2019$ .  Labs:    Component Value Date/Time   K 4.0 09/27/2022 0303   MG 2.3 09/27/2022 0303     Plan: Potassium: K >/= 4: No additional supplementation needed  Magnesium: Mg > 2: No additional supplementation needed   Thank you for allowing pharmacy to participate in this patient's care   Antonietta Jewel, PharmD, [place] Pharmacist  Phone: [phone] 09/27/2022 7:28 AM  Please check AMION for all [place] phone numbers After 10:00 PM, call [place] [phone]

## 2022-09-27 NOTE — Inpatient Diabetes Management (Signed)
Inpatient Diabetes Program Recommendations  AACE/ADA: New Consensus Statement on Inpatient Glycemic Control (2015)  Target Ranges:  Prepandial:   less than 140 mg/dL      Peak postprandial:   less than 180 mg/dL (1-2 hours)      Critically ill patients:  140 - 180 mg/dL   Lab Results  Component Value Date   GLUCAP 129 (H) 09/27/2022   HGBA1C 6.8 (H) 05/27/2022    Review of Glycemic Control  Latest Reference Range & Units 09/26/22 15:55 09/26/22 21:15 09/27/22 08:35 09/27/22 11:46 09/27/22 12:21  Glucose-Capillary 70 - 99 mg/dL 87 167 (H) 185 (H) 67 (L) 129 (H)  (H): Data is abnormally high (L): Data is abnormally low Diabetes history: Type 2 DM Outpatient Diabetes medications: Actos 30 mg QD, Jardiance 25 mg QD, Trulicity 3 mg qwk Current orders for Inpatient glycemic control: Novolog 0-9 units TID, Actos 30 mg QD, Jardiance 25 mg QD  Inpatient Diabetes Program Recommendations:    Noted hypoglycemia this AM following Novolog 2 units of correction. With renal status, may want to consider reducing correction to Novolog 0-6 units TID. Secure chat sent to MD.   Thanks, Bronson Curb, MSN, RNC-OB Diabetes Coordinator [phone] (8a-5p)

## 2022-09-27 NOTE — Care Management Important Message (Signed)
Important Message  Patient Details  Name: Matthew Chen MRN: [id number] Date of Birth: 1956-05-05   Medicare Important Message Given:  Yes     Herald Vallin [address] 09/27/2022, 2:51 PM

## 2022-09-27 NOTE — Progress Notes (Addendum)
Electrophysiology Rounding Note  Patient Name: Matthew Chen Date of Encounter: 09/27/2022  Primary Cardiologist: Quay Burow, MD  Electrophysiologist: None    Subjective   Pt remains in NSR   QTc from EKG this am shows prolonged QTc > 530 ms. We have abandoned Tikosyn.   The patient is doing OK this am. At this time, the patient denies chest pain, shortness of breath, or any new concerns.  Inpatient Medications    Scheduled Meds:  apixaban  5 mg Oral BID   aspirin EC  81 mg Oral Daily   atorvastatin  80 mg Oral Daily   carvedilol  12.5 mg Oral BID WC   cyanocobalamin  1,000 mcg Oral Daily   empagliflozin  25 mg Oral QAC breakfast   fenofibrate  160 mg Oral Daily   insulin aspart  0-9 Units Subcutaneous TID WC   lenvatinib 12 mg daily dose  12 mg Oral Daily   lisinopril  20 mg Oral Daily   pantoprazole  40 mg Oral BID   pioglitazone  30 mg Oral Daily   sodium chloride flush  3 mL Intravenous Q12H   Continuous Infusions:  sodium chloride     PRN Meds: sodium chloride, HYDROcodone-acetaminophen, sodium chloride flush   Vital Signs    Vitals:   09/26/22 1510 09/26/22 1528 09/26/22 2059 09/27/22 0413  BP: (!) 142/82 126/79 (!) 146/72 (!) 142/77  Pulse: (!) 49 (!) 49  (!) 50  Resp: 13 15  18  $ Temp: (!) 97.5 F (36.4 C)  (!) 97.4 F (36.3 C) 97.7 F (36.5 C)  TempSrc: Oral  Oral Oral  SpO2: 95%   90%  Weight:      Height:        Intake/Output Summary (Last 24 hours) at 09/27/2022 0834 Last data filed at 09/26/2022 2100 Gross per 24 hour  Intake 240 ml  Output --  Net 240 ml   Filed Weights   09/24/22 1440 09/24/22 1500  Weight: 95.2 kg 92.5 kg    Physical Exam    GEN- NAD, A&O x 3. Normal affect.  Lungs- CTAB, Normal effort.  Heart- Regular rate and rhythm. No M/G/R GI- Soft, NT, ND Extremities- No clubbing, cyanosis, or edema Skin- no rash or lesion  Labs    CBC No results for input(s): "WBC", "NEUTROABS", "HGB", "HCT", "MCV", "PLT" in  the last 72 hours. Basic Metabolic Panel Recent Labs    09/26/22 0313 09/27/22 0303  NA 137 138  K 4.3 4.0  CL 103 102  CO2 23 27  GLUCOSE 143* 93  BUN 24* 21  CREATININE 1.77* 1.84*  CALCIUM 8.7* 8.3*  MG 2.3 2.3    Telemetry    Sinus  brady/ NSR upper 40s - 60s (personally reviewed)  Patient Profile     Matthew Chen is a 67 y.o. male with a past medical history significant for persistent atrial fibrillation.  They were admitted for tikosyn load.   Assessment & Plan    Persistent atrial fibrillation Pt remains in NSR Unfortunately, his QT remains quite prolonged despite holding evening Tikosyn.  We Matthew Chen abandon Tikosyn at this juncture.  We have reached out to Heme/Onc to see if there is a reasonable alternative to his chemo drug to make other AADs potential options (mainly multaq) Flecainide poor option given Dx that was unable to be intervened upon Amiodarone poor option with liver CA and early cirrhosis on scans.  Continue Eliquis Creatinine, ser  1.84* (02/16 0303)  Magnesium  2.3 (02/16 0303) Potassium4.0 (02/16 0303)  Recheck EKG at noon.  If QT improving, potentially home with close follow up.   Further disposition pending EKG and discussion with heme/onc.   His Lenvatinib half life is 28 hours, so he would not be candidate for an alternative AAD this admission. Would need to start as an outpatient, earliest next week if his chemo drugs were to be re-arranged.    For questions or updates, please contact Matthew Chen Please consult [email or web address] for contact info under Cardiology/STEMI.  Signed, Matthew Friar, [name]  09/27/2022, 8:34 AM    I have seen and examined this patient with Matthew Chen.  Agree with above, note added to reflect my findings.  Remains in sinus rhythm.  QTc significantly prolonged this morning.  GEN: Well nourished, well developed, in no acute distress  HEENT: normal  Neck: no JVD, carotid bruits, or masses Cardiac: RRR; no  murmurs, rubs, or gallops,no edema  Respiratory:  clear to auscultation bilaterally, normal work of breathing GI: soft, nontender, nondistended, + BS MS: no deformity or atrophy  Skin: warm and dry Neuro:  Strength and sensation are intact Psych: euthymic mood, full affect   Persistent atrial fibrillation: QTc unfortunately significantly prolonged.  He does remain in sinus rhythm.  Have reached out to heme-onc who may wish to adjust his chemotherapy regimen to allow [place] to use alternative antiarrhythmics.  In the short-term, amiodarone may be a reasonable option.  This QT does shorten over the next few hours, may be able to get 125 mcg twice daily tonight.  Otherwise, may need to be loaded on amiodarone as an outpatient.  This is despite his issues with hepatic steatosis.  Unfortunately, there are no other reasonable options.  If his life expectancy is more than a few months, he may be an ablation candidate, but this would be high risk.  Matthew Mclaren M. [name] MD 09/27/2022 1:04 PM

## 2022-09-28 DIAGNOSIS — I4819 Other persistent atrial fibrillation: Secondary | ICD-10-CM | POA: Diagnosis not present

## 2022-09-28 LAB — BASIC METABOLIC PANEL
Anion gap: 9 (ref 5–15)
BUN: 20 mg/dL (ref 8–23)
CO2: 22 mmol/L (ref 22–32)
Calcium: 8.5 mg/dL — ABNORMAL LOW (ref 8.9–10.3)
Chloride: 102 mmol/L (ref 98–111)
Creatinine, Ser: 1.72 mg/dL — ABNORMAL HIGH (ref 0.61–1.24)
GFR, Estimated: 43 mL/min — ABNORMAL LOW (ref >60)
Glucose, Bld: 179 mg/dL — ABNORMAL HIGH (ref 70–99)
Potassium: 4.2 mmol/L (ref 3.5–5.1)
Sodium: 133 mmol/L — ABNORMAL LOW (ref 135–145)

## 2022-09-28 LAB — MAGNESIUM: Magnesium: 2.1 mg/dL (ref 1.7–2.4)

## 2022-09-28 LAB — GLUCOSE, CAPILLARY: Glucose-Capillary: 168 mg/dL — ABNORMAL HIGH (ref 70–99)

## 2022-09-28 NOTE — Plan of Care (Signed)
  Problem: Education: Goal: Knowledge of General Education information will improve Description: Including pain rating scale, medication(s)/side effects and non-pharmacologic comfort measures Outcome: Adequate for Discharge   Problem: Health Behavior/Discharge Planning: Goal: Ability to manage health-related needs will improve Outcome: Adequate for Discharge   Problem: Clinical Measurements: Goal: Ability to maintain clinical measurements within normal limits will improve Outcome: Adequate for Discharge Goal: Will remain free from infection Outcome: Adequate for Discharge Goal: Diagnostic test results will improve Outcome: Adequate for Discharge Goal: Respiratory complications will improve Outcome: Adequate for Discharge Goal: Cardiovascular complication will be avoided Outcome: Adequate for Discharge   Problem: Activity: Goal: Risk for activity intolerance will decrease Outcome: Adequate for Discharge   Problem: Nutrition: Goal: Adequate nutrition will be maintained Outcome: Adequate for Discharge   Problem: Coping: Goal: Level of anxiety will decrease Outcome: Adequate for Discharge   Problem: Elimination: Goal: Will not experience complications related to bowel motility Outcome: Adequate for Discharge Goal: Will not experience complications related to urinary retention Outcome: Adequate for Discharge   Problem: Pain Managment: Goal: General experience of comfort will improve Outcome: Adequate for Discharge   Problem: Safety: Goal: Ability to remain free from injury will improve Outcome: Adequate for Discharge   Problem: Skin Integrity: Goal: Risk for impaired skin integrity will decrease Outcome: Adequate for Discharge   Problem: Education: Goal: Ability to describe self-care measures that may prevent or decrease complications (Diabetes Survival Skills Education) will improve Outcome: Adequate for Discharge Goal: Individualized Educational Video(s) Outcome:  Adequate for Discharge   Problem: Coping: Goal: Ability to adjust to condition or change in health will improve Outcome: Adequate for Discharge   Problem: Fluid Volume: Goal: Ability to maintain a balanced intake and output will improve Outcome: Adequate for Discharge   Problem: Health Behavior/Discharge Planning: Goal: Ability to identify and utilize available resources and services will improve Outcome: Adequate for Discharge Goal: Ability to manage health-related needs will improve Outcome: Adequate for Discharge   Problem: Metabolic: Goal: Ability to maintain appropriate glucose levels will improve Outcome: Adequate for Discharge   Problem: Nutritional: Goal: Maintenance of adequate nutrition will improve Outcome: Adequate for Discharge Goal: Progress toward achieving an optimal weight will improve Outcome: Adequate for Discharge   Problem: Skin Integrity: Goal: Risk for impaired skin integrity will decrease Outcome: Adequate for Discharge   Problem: Tissue Perfusion: Goal: Adequacy of tissue perfusion will improve Outcome: Adequate for Discharge   Problem: Education: Goal: Knowledge of disease or condition will improve Outcome: Adequate for Discharge Goal: Understanding of medication regimen will improve Outcome: Adequate for Discharge Goal: Individualized Educational Video(s) Outcome: Adequate for Discharge   Problem: Activity: Goal: Ability to tolerate increased activity will improve Outcome: Adequate for Discharge   Problem: Cardiac: Goal: Ability to achieve and maintain adequate cardiopulmonary perfusion will improve Outcome: Adequate for Discharge   Problem: Health Behavior/Discharge Planning: Goal: Ability to safely manage health-related needs after discharge will improve Outcome: Adequate for Discharge

## 2022-09-28 NOTE — Discharge Summary (Signed)
Physician Discharge Summary      Patient ID: Matthew Chen MRN: [id number] DOB/AGE: 02/05/1956 67 y.o.  Admit date: 09/24/2022 Discharge date: 09/28/2022   Primary Discharge Diagnosis atrial fibrillation Secondary Discharge Diagnosis atrial fibrillation  Hospital Course: Patient presented for Tikosyn load. Qtc prolonged significantly resulting in reduction in dosing and ultimately failure of Tikosyn therapy. The plan is for discharge today with outpatient follow-up this week to discuss plan moving forward. His Qtc is decreasing with cessation of Tikosyn.   I reviewed telemetry. He is bradycardic and Qtc is prolonged but I have not seen PVCs or NSVT events to warrant concern for TdP.    Discharge Exam: Blood pressure 132/85, pulse (!) 52, temperature 97.6 F (36.4 C), temperature source Oral, resp. rate 18, height 5' 10"$  (1.778 m), weight 92.5 kg, SpO2 99 %.    Gen: Appears comfortable, well-nourished CV: Regular, brady rhythm, trace dependent edema Pulm: breathing easily  Labs:   Lab Results  Component Value Date   WBC 5.5 09/23/2022   HGB 14.6 09/23/2022   HCT 48.0 09/23/2022   MCV 81.8 09/23/2022   PLT 244 09/23/2022    Recent Labs  Lab 09/23/22 0809 09/24/22 0840 09/28/22 0801  NA 140   < > 133*  K 4.4   < > 4.2  CL 104   < > 102  CO2 29   < > 22  BUN 24*   < > 20  CREATININE 1.87*   < > 1.72*  CALCIUM 9.3   < > 8.5*  PROT 7.0  --   --   BILITOT 0.9  --   --   ALKPHOS 83  --   --   ALT 22  --   --   AST 41  --   --   GLUCOSE 101*   < > 179*   < > = values in this interval not displayed.   No results found for: "CKTOTAL", "CKMB", "CKMBINDEX", "TROPONINI"  Lab Results  Component Value Date   CHOL 110 05/27/2022   CHOL 95 02/08/2022   CHOL 133 03/02/2021   Lab Results  Component Value Date   HDL 36 (L) 05/27/2022   HDL 37 (L) 02/08/2022   HDL 30 (L) 03/02/2021   Lab Results  Component Value Date   LDLCALC 49 05/27/2022   LDLCALC 37 02/08/2022    LDLCALC 68 03/02/2021   Lab Results  Component Value Date   TRIG 175 (H) 05/27/2022   TRIG 125 02/08/2022   TRIG 266 (H) 03/02/2021   Lab Results  Component Value Date   CHOLHDL 3.1 05/27/2022   CHOLHDL 2.6 02/08/2022   CHOLHDL 4.4 03/02/2021   No results found for: "LDLDIRECT"    [id number] bradycardia, QTC 508   FOLLOW UP PLANS AND APPOINTMENTS  Allergies as of 09/28/2022       Reactions   Penicillins Other (See Comments)   Unsure, childhood reaction Has patient had a PCN reaction causing immediate rash, facial/tongue/throat swelling, SOB or lightheadedness with hypotension: Unknown Has patient had a PCN reaction causing severe rash involving mucus membranes or skin necrosis: Unknown Has patient had a PCN reaction that required hospitalization: No Has patient had a PCN reaction occurring within the last 10 years: No If all of the above answers are "NO", then may proceed with Cephalosporin use.        Medication List     TAKE these medications    apixaban 5 MG Tabs tablet Commonly known as: Eliquis  Take 1 tablet (5 mg total) by mouth 2 (two) times daily.   aspirin EC 81 MG tablet Take 1 tablet (81 mg total) by mouth daily.   atorvastatin 80 MG tablet Commonly known as: LIPITOR TAKE 1 TABLET BY MOUTH EVERY DAY   B-D UF III MINI PEN NEEDLES 31G X 5 MM Misc Generic drug: Insulin Pen Needle Use daily with Victoza   carvedilol 12.5 MG tablet Commonly known as: COREG TAKE 3 TABLETS (37.5 MG) BY MOUTH 2 TIMES DAILY   cyanocobalamin 1000 MCG tablet Commonly known as: VITAMIN B12 Take 1,000 mcg by mouth daily.   Entyvio 300 MG injection Generic drug: vedolizumab Inject 300 mg as directed See admin instructions. Every 2 months   fenofibrate 160 MG tablet Take 1 tablet (160 mg total) by mouth daily.   Fish Oil 1000 MG Caps Take 1,000 mg by mouth 2 (two) times daily.   HYDROcodone-acetaminophen 7.5-325 MG tablet Commonly known as: Norco Take 1 tablet by  mouth every 6 (six) hours as needed for moderate pain.   Jardiance 25 MG Tabs tablet Generic drug: empagliflozin TAKE 1 TABLET BY MOUTH DAILY BEFORE BREAKFAST.   lenvatinib 12 mg daily dose 3 x 4 MG capsule Commonly known as: LENVIMA Take 3 capsules (12 mg total) by mouth daily.   lisinopril 20 MG tablet Commonly known as: ZESTRIL Take 1 tablet (20 mg total) by mouth daily.   OneTouch Ultra test strip Generic drug: glucose blood CHECK BLOOD SUGAR TWICE A DAY   Pain Relief Extra Strength 500 MG tablet Generic drug: acetaminophen Take 1 tablet (500 mg total) by mouth every 6 (six) hours as needed.   pantoprazole 40 MG tablet Commonly known as: PROTONIX Take 1 tablet (40 mg total) by mouth 2 (two) times daily.   pioglitazone 30 MG tablet Commonly known as: ACTOS TAKE 1 TABLET BY MOUTH EVERY DAY. STRENGTH: 30 MG   sildenafil 100 MG tablet Commonly known as: Viagra Take 0.5-1 tablets (50-100 mg total) by mouth daily as needed for erectile dysfunction.   Trulicity 3 0000000 Sopn Generic drug: Dulaglutide Inject 3 mg as directed once a week.         BRING ALL MEDICATIONS WITH YOU TO FOLLOW UP APPOINTMENTS  Greater than 30 minutes spent on discharge today. Signed: Melida Quitter, MD 09/28/2022, 9:18 AM

## 2022-09-30 ENCOUNTER — Telehealth: Payer: Self-pay

## 2022-09-30 ENCOUNTER — Other Ambulatory Visit: Payer: Self-pay

## 2022-09-30 NOTE — Transitions of Care (Post Inpatient/ED Visit) (Signed)
   09/30/2022  Name: Matthew Chen MRN: [id number] DOB: 02-08-1956  Today's TOC FU Call Status: Today's TOC FU Call Status:: Successful TOC FU Call Competed TOC FU Call Complete Date: 09/30/22  Transition Care Management Follow-up Telephone Call Date of Discharge: 09/28/22 Discharge Facility: [facility]) Type of Discharge: Inpatient Admission Primary Inpatient Discharge Diagnosis:: afib How have you been since you were released from the hospital?: Better Any questions or concerns?: No  Items Reviewed: Did you receive and understand the discharge instructions provided?: Yes Medications obtained and verified?: Yes (Medications Reviewed) Any new allergies since your discharge?: No Dietary orders reviewed?: Yes  Home Care and Equipment/Supplies: [place] Ordered?: NA Any new equipment or medical supplies ordered?: NA  Functional Questionnaire: Do you need assistance with bathing/showering or dressing?: No Do you need assistance with meal preparation?: No Do you need assistance with eating?: No Do you have difficulty maintaining continence: No Do you need assistance with getting out of bed/getting out of a chair/moving?: No  Folllow up appointments reviewed: PCP Follow-up appointment confirmed?: NA Specialist Hospital Follow-up appointment confirmed?: Yes Date of Specialist follow-up appointment?: 10/04/22 Follow-Up Specialty Provider:: Dr Chalmers Cater Do you need transportation to your follow-up appointment?: No Do you understand care options if your condition(s) worsen?: Yes-patient verbalized understanding    [place], [place] Nurse Health Advisor Direct Dial [phone]

## 2022-10-01 DIAGNOSIS — I480 Paroxysmal atrial fibrillation: Secondary | ICD-10-CM | POA: Diagnosis not present

## 2022-10-02 ENCOUNTER — Encounter: Payer: Self-pay | Admitting: Nurse Practitioner

## 2022-10-02 ENCOUNTER — Inpatient Hospital Stay: Payer: PPO | Admitting: Nurse Practitioner

## 2022-10-02 ENCOUNTER — Encounter: Payer: Self-pay | Admitting: Pharmacist

## 2022-10-02 ENCOUNTER — Other Ambulatory Visit: Payer: PPO | Admitting: Pharmacist

## 2022-10-02 VITALS — BP 150/82 | HR 63 | Temp 98.1°F | Resp 18 | Ht 70.0 in | Wt 213.0 lb

## 2022-10-02 DIAGNOSIS — C220 Liver cell carcinoma: Secondary | ICD-10-CM | POA: Diagnosis not present

## 2022-10-02 DIAGNOSIS — Z79899 Other long term (current) drug therapy: Secondary | ICD-10-CM | POA: Diagnosis not present

## 2022-10-02 DIAGNOSIS — I480 Paroxysmal atrial fibrillation: Secondary | ICD-10-CM | POA: Diagnosis not present

## 2022-10-02 DIAGNOSIS — R531 Weakness: Secondary | ICD-10-CM | POA: Diagnosis not present

## 2022-10-02 NOTE — Progress Notes (Signed)
[facility] OFFICE PROGRESS NOTE   Diagnosis: Hepatocellular carcinoma  INTERVAL HISTORY:   [name] returns as scheduled.  He continues lenvatinib.  He was hospitalized 09/24/2022 through 09/28/2022 for initiation of Tikosyn.  QTc prolonged significantly.  This resulted in reduction of dose and ultimate failure of Tikosyn therapy.  He feels weak.  He wonders if he is back in A-fib.  He has stable dyspnea on exertion.  No chest pain.  No leg swelling or calf pain.  He denies nausea/vomiting.  No mouth sores.  No diarrhea.  No rash.  Objective:  Vital signs in last 24 hours:  Blood pressure (!) 150/82, pulse 63, temperature 98.1 F (36.7 C), temperature source Oral, resp. rate 18, height '5\' 10"'$  (1.778 m), weight 213 lb (96.6 kg), SpO2 99 %.    HEENT: No thrush or ulcers. Resp: Lungs clear bilaterally. Cardio: Regular, mild bradycardia. GI: No hepatosplenomegaly. Vascular: No leg edema. Neuro: Alert and oriented. Skin: No rash.   Lab Results:  Lab Results  Component Value Date   WBC 5.5 09/23/2022   HGB 14.6 09/23/2022   HCT 48.0 09/23/2022   MCV 81.8 09/23/2022   PLT 244 09/23/2022   NEUTROABS 3.3 09/23/2022    Imaging:  No results found.  Medications: I have reviewed the patient's current medications.  Assessment/Plan: Hepatocellular carcinoma CT angio chest aorta 01/30/2021-no evidence of thoracic aortic aneurysm, diffuse hepatic steatosis, suggestion of early cirrhosis, 7.1 cm enhancing masslike area in segment 5/6, prominent gastrohepatic and periportal nodes measuring up to 7 mm MRI liver 03/22/2021-arterial hyperenhancing subcapsular mass, segment 6, 7 x 5.1 cm, LI-RADS 5, no pathologically large lymph nodes Ultrasound-guided biopsy 04/24/2021-hepatocellular carcinoma Y90 radioembolization of segment 6 and segment 7 hepatic arterial branches 06/21/2021 MRI abdomen 09/03/2021-new increased enhancement inferior and superior to the dominant right liver  lesion, new 11 mm enhancing lesion in the left liver LIRADS 3, dominant right hepatic lesion stable in size MRI abdomen 12/19/2021-unchanged hyperenhancing mass in the anterior inferior right liver measuring 7.8 x 5 cm, interval enlargement of a arterial enhancing lesion in segment 2- LIRADS 5, new small enhancing lesions hepatic segment 4A measuring 1.2 x 0.9 cm and 0.8 x 0.6 cm- LIRADS 5 Cycle 1 atezolizumab /bevacizumab 02/07/2022 Cycle 2 atezolizumab/bevacizumab 02/27/2022 Cycle 3 atezolizumab/bevacizumab 03/21/2022 Cycle 4 atezolizumab/bevacizumab 04/22/2022 MRI abdomen 05/05/2022-no change in dominant segment 6 lesion, no change in hyperenhancing lesions in segment 4A and segment 2, no new lesions Cycle 5 atezolizumab/bevacizumab 05/13/2022 Cycle 6 atezolizumab/bevacizumab 06/04/2022 Cycle 7 atezolizumab/bevacizumab 06/24/2022 Cycle 8 atezolizumab/bevacizumab 07/15/2022 MRI abdomen 07/20/2022-mild progression of bilateral liver lesions, no evidence of extrahepatic metastatic disease MRI [facility] 08/19/2022-increased size of the dominant admitted 5/6 lesion, now measuring 12.7 x 8.8 cm, multiple additional hepatic lesions appear larger than on the previous exam, stable mildly enlarged upper abdominal lymph nodes Lenvatinib 09/09/2022 Ulcerative colitis-maintained on vedolizumab Peripheral arterial disease History of a thoracic aortic aneurysm CAD Diabetes Hypertension Chronic renal failure Paroxysmal atrial fibrillation    Disposition: [name] appears stable.  He began lenvatinib 09/09/2022.  He was hospitalized for management of atrial fibrillation with initiation of Tikosyn 09/24/2022.  He had significant QTc prolongation initially requiring dose reduction and then ultimate discontinuation of Tikosyn.  We discussed an alternative to lenvatinib being cabozantinib.  We reviewed potential toxicities including hypertension, proteinuria, rash, diarrhea, bleeding.  He understands there is potential  for QTc prolongation with cabozantinib as well.  We will discuss with the [facility] oral pharmacist to try and  determine the actual risk.  Today he appears to be in normal sinus rhythm.  He has follow-up with cardiology 10/04/2022.  We will await their recommendation regarding treatment of the atrial fibrillation.  [name] has follow-up scheduled with [place] on 10/18/2022.  We are available to see him sooner.  Patient seen with Dr. Benay Chen.  Ned Card ANP/GNP-BC   10/02/2022  11:32 AM This was a shared visit with Ned Card.  [name] is stable.  He was admitted for monitoring while on Tikosyn, but the QT prolonged.  Tikosyn was discontinued.  I discussed the case with cardiology.  They plan to begin amiodarone.  [name] will discontinue lenvatinib.  He will begin cabozantinib next week.  Reviewed potential toxicities associated with cabozantinib.  He agrees to proceed.  He will discontinue lenvatinib October 04, 2022 with the plan to begin cabozantinib during the week of 10/07/2022.  I was present for greater than 50% of today's visit.  I performed medical decision making.  Matthew Manson, MD  I was present for greater than 50% of today's visit.  I performed medical decision making.

## 2022-10-03 NOTE — Progress Notes (Addendum)
PCP:  Matthew Frizzle, MD Primary Cardiologist: Matthew Burow, MD Electrophysiologist: Matthew Haw, MD   Matthew Chen is a 67 y.o. male seen today for Matthew Meredith Leeds, MD for post hospital follow up.    Admitted 2/13 - 2/17 for tikosyn load. Pt had QT prolongation felt at least in part due to his lenvatinib. Discussed with his oncologist trying a different chemo agent which would allow for use of multaq vs amiodarone.   Since discharge from hospital the patient reports doing well. He has noticed a significant difference in his breathing and energy level being in NSR. he denies chest pain, palpitations, dyspnea, PND, orthopnea, nausea, vomiting, dizziness, syncope, edema, weight gain, or early satiety.   Past Medical History:  Diagnosis Date   Adenomatous colon polyp 03/2008   Anginal pain ([place])    Aortic aneurysm, thoracic ([place]) 09/09/2016   4.4 cm by echo 05/2015   Arthritis    "joints in my fingers ache" (07/13/2015)   Bicuspid aortic valve 09/09/2016   Cataract 2019   bilateral eyes   CKD (chronic kidney disease), stage III (HCC)    Coronary artery disease    Diabetes mellitus without complication (HCC)    Hemorrhoids    Hepatocellular carcinoma (HCC)    Hyperlipidemia    Hypertension    Paroxysmal atrial fibrillation (HCC)    Peripheral arterial disease ([place])    a. dopppler 05/29/2015 revealed high-grade right popliteal and distal left SFA disease b. LE angio 06/28/2015 revealing high grade calcific dx with distal L SFA and R popliteal artery s/p diamondback orbital rotational atherectomy, PTA of L SFA  c. 07/13/2015 diamondback orbital rotational atherectomy and drug eluting balloon angioplasty of R popliteal artery (P2 segment) using distal protection    Type II diabetes mellitus (HCC)    Ulcerative colitis     Current Outpatient Medications  Medication Instructions   apixaban (ELIQUIS) 5 mg, Oral, 2 times daily   aspirin EC 81 mg, Oral, Daily    atorvastatin (LIPITOR) 80 MG tablet TAKE 1 TABLET BY MOUTH EVERY DAY   carvedilol (COREG) 12.5 MG tablet TAKE 3 TABLETS (37.5 MG) BY MOUTH 2 TIMES DAILY   cyanocobalamin (VITAMIN B12) 1,000 mcg, Oral, Daily   Entyvio 300 mg, Injection, See admin instructions, Every 2 months   fenofibrate 160 mg, Oral, Daily   Fish Oil 1,000 mg, Oral, 2 times daily   glucose blood (ONETOUCH ULTRA) test strip CHECK BLOOD SUGAR TWICE A DAY   HYDROcodone-acetaminophen (NORCO) 7.5-325 MG tablet 1 tablet, Oral, Every 6 hours PRN   Insulin Pen Needle (B-D UF III MINI PEN NEEDLES) 31G X 5 MM MISC Use daily with Victoza   Jardiance 25 mg, Oral, Daily before breakfast   lenvatinib 12 mg daily dose (LENVIMA) 12 mg, Oral, Daily   lisinopril (ZESTRIL) 20 mg, Oral, Daily   Pain Relief Extra Strength 500 mg, Oral, Every 6 hours PRN   pantoprazole (PROTONIX) 40 mg, Oral, 2 times daily   pioglitazone (ACTOS) 30 MG tablet TAKE 1 TABLET BY MOUTH EVERY DAY. STRENGTH: 30 MG   sildenafil (VIAGRA) 50-100 mg, Oral, Daily PRN   Trulicity 3 mg, Injection, Weekly    Physical Exam: Vitals:   10/04/22 0918  BP: 126/70  Pulse: (!) 53  SpO2: 97%  Weight: 210 lb 6.4 oz (95.4 kg)  Height: '5\' 10"'$  (1.778 m)    GEN- NAD. A&O x 3. Normal affect. HEENT: normocephalic, atraumatic Lungs- CTAB, Normal effort Heart- Regular rate and  rhythm, No M/G/R Extremities- No peripheral edema. no clubbing or cyanosis; Skin- warm and dry, no rash or lesion  EKG is ordered today. Personal review shows sinus brady at 53 bpm, QT ~460 ms  Additional studies reviewed include: Previous EP notes.   MRI 07/20/2022 1. Motion and bolus timing degradation, as detailed above. 2. Given this limitation, mild progression of bilateral, right larger than left liver lesions. 3. No evidence of extrahepatic metastatic disease. "Early cirrhosis" was also described.   Echo 03/2022 LVEF 65-70%, Normal RV  Assessment and Plan:  1. Persistent atrial  fibrillation EKG today shows sinus brady.  Failed tikosyn due to QT prolongation, complicated by lenvatinib use Discussed with his oncology team. Last dose of lenvatinib today, Matthew transition to cabozantinib next week.  Dr. Venida Chen recommends at least 3-4 day lenvatinib wash out.  For caution, we Matthew start amiodarone on Friday at 200 mg BID  2. Hepatocellular carcinoma Note plans from Oncology 2/21 to potentially change to cabozantinib "Early cirrhosis" was described on pts MRI. I discussed at length with the patient that amiodarone is likely his best bet at maintaining NSR, and we Matthew monitor liver function closely.   Time spend during this visit including speaking directly with patients oncologist via phone while patient was still in the office, as well as my attending physician.   Follow up with EP APP in 4 weeks  Matthew Chen  10/04/22 9:33 AM

## 2022-10-04 ENCOUNTER — Encounter: Payer: Self-pay | Admitting: Student

## 2022-10-04 ENCOUNTER — Ambulatory Visit: Payer: PPO | Attending: Student | Admitting: Student

## 2022-10-04 ENCOUNTER — Other Ambulatory Visit (HOSPITAL_COMMUNITY): Payer: Self-pay

## 2022-10-04 DIAGNOSIS — C220 Liver cell carcinoma: Secondary | ICD-10-CM | POA: Diagnosis not present

## 2022-10-04 DIAGNOSIS — I4819 Other persistent atrial fibrillation: Secondary | ICD-10-CM | POA: Diagnosis not present

## 2022-10-04 MED ORDER — AMIODARONE HCL 200 MG PO TABS
200.0000 mg | ORAL_TABLET | Freq: Two times a day (BID) | ORAL | 3 refills | Status: DC
  Filled 2022-10-04: qty 180, 90d supply, fill #0

## 2022-10-04 NOTE — Patient Instructions (Signed)
Medication Instructions:  Your physician recommends that you continue on your current medications as directed. Please refer to the Current Medication list given to you today.  *If you need a refill on your cardiac medications before your next appointment, please call your pharmacy*  Follow-Up: At [facility], you and your health needs are our priority.  As part of our continuing mission to provide you with exceptional heart care, we have created designated Provider Care Teams.  These Care Teams include your primary Cardiologist (physician) and Advanced Practice Providers (APPs -  Physician Assistants and Nurse Practitioners) who all work together to provide you with the care you need, when you need it.  Your next appointment:   1 month(s)  Provider:  Legrand Como "[name], [name]

## 2022-10-05 ENCOUNTER — Other Ambulatory Visit: Payer: Self-pay

## 2022-10-05 ENCOUNTER — Encounter (HOSPITAL_COMMUNITY): Payer: Self-pay | Admitting: Emergency Medicine

## 2022-10-05 ENCOUNTER — Emergency Department (HOSPITAL_COMMUNITY): Payer: PPO

## 2022-10-05 ENCOUNTER — Inpatient Hospital Stay (HOSPITAL_COMMUNITY)
Admission: EM | Admit: 2022-10-05 | Discharge: 2022-10-07 | DRG: 309 | Disposition: A | Discharge status: Home / Self Care | Payer: PPO | Attending: Cardiology | Admitting: Cardiology

## 2022-10-05 DIAGNOSIS — I4819 Other persistent atrial fibrillation: Secondary | ICD-10-CM | POA: Diagnosis present

## 2022-10-05 DIAGNOSIS — D6869 Other thrombophilia: Secondary | ICD-10-CM | POA: Diagnosis present

## 2022-10-05 DIAGNOSIS — I130 Hypertensive heart and chronic kidney disease with heart failure and stage 1 through stage 4 chronic kidney disease, or unspecified chronic kidney disease: Secondary | ICD-10-CM | POA: Diagnosis present

## 2022-10-05 DIAGNOSIS — C220 Liver cell carcinoma: Secondary | ICD-10-CM | POA: Diagnosis not present

## 2022-10-05 DIAGNOSIS — Z7985 Long-term (current) use of injectable non-insulin antidiabetic drugs: Secondary | ICD-10-CM

## 2022-10-05 DIAGNOSIS — E1151 Type 2 diabetes mellitus with diabetic peripheral angiopathy without gangrene: Secondary | ICD-10-CM | POA: Diagnosis not present

## 2022-10-05 DIAGNOSIS — Z800 Family history of malignant neoplasm of digestive organs: Secondary | ICD-10-CM | POA: Diagnosis not present

## 2022-10-05 DIAGNOSIS — I739 Peripheral vascular disease, unspecified: Secondary | ICD-10-CM | POA: Diagnosis not present

## 2022-10-05 DIAGNOSIS — E118 Type 2 diabetes mellitus with unspecified complications: Secondary | ICD-10-CM

## 2022-10-05 DIAGNOSIS — Z83719 Family history of colon polyps, unspecified: Secondary | ICD-10-CM | POA: Diagnosis not present

## 2022-10-05 DIAGNOSIS — Z833 Family history of diabetes mellitus: Secondary | ICD-10-CM

## 2022-10-05 DIAGNOSIS — E1122 Type 2 diabetes mellitus with diabetic chronic kidney disease: Secondary | ICD-10-CM | POA: Diagnosis not present

## 2022-10-05 DIAGNOSIS — R0600 Dyspnea, unspecified: Secondary | ICD-10-CM | POA: Diagnosis not present

## 2022-10-05 DIAGNOSIS — Z8249 Family history of ischemic heart disease and other diseases of the circulatory system: Secondary | ICD-10-CM | POA: Diagnosis not present

## 2022-10-05 DIAGNOSIS — Z79620 Long term (current) use of immunosuppressive biologic: Secondary | ICD-10-CM | POA: Diagnosis not present

## 2022-10-05 DIAGNOSIS — Z79899 Other long term (current) drug therapy: Secondary | ICD-10-CM | POA: Diagnosis not present

## 2022-10-05 DIAGNOSIS — Z7982 Long term (current) use of aspirin: Secondary | ICD-10-CM | POA: Diagnosis not present

## 2022-10-05 DIAGNOSIS — Z7901 Long term (current) use of anticoagulants: Secondary | ICD-10-CM

## 2022-10-05 DIAGNOSIS — Z7984 Long term (current) use of oral hypoglycemic drugs: Secondary | ICD-10-CM

## 2022-10-05 DIAGNOSIS — R7989 Other specified abnormal findings of blood chemistry: Secondary | ICD-10-CM | POA: Diagnosis not present

## 2022-10-05 DIAGNOSIS — Z86010 Personal history of colonic polyps: Secondary | ICD-10-CM

## 2022-10-05 DIAGNOSIS — I4891 Unspecified atrial fibrillation: Secondary | ICD-10-CM

## 2022-10-05 DIAGNOSIS — K5190 Ulcerative colitis, unspecified, without complications: Secondary | ICD-10-CM | POA: Diagnosis present

## 2022-10-05 DIAGNOSIS — Z794 Long term (current) use of insulin: Secondary | ICD-10-CM

## 2022-10-05 DIAGNOSIS — N183 Chronic kidney disease, stage 3 unspecified: Secondary | ICD-10-CM | POA: Diagnosis present

## 2022-10-05 DIAGNOSIS — I10 Essential (primary) hypertension: Secondary | ICD-10-CM

## 2022-10-05 DIAGNOSIS — I7121 Aneurysm of the ascending aorta, without rupture: Secondary | ICD-10-CM | POA: Diagnosis present

## 2022-10-05 DIAGNOSIS — E785 Hyperlipidemia, unspecified: Secondary | ICD-10-CM | POA: Diagnosis not present

## 2022-10-05 DIAGNOSIS — I712 Thoracic aortic aneurysm, without rupture, unspecified: Secondary | ICD-10-CM | POA: Diagnosis present

## 2022-10-05 DIAGNOSIS — T50995A Adverse effect of other drugs, medicaments and biological substances, initial encounter: Secondary | ICD-10-CM | POA: Diagnosis present

## 2022-10-05 DIAGNOSIS — I480 Paroxysmal atrial fibrillation: Secondary | ICD-10-CM | POA: Diagnosis not present

## 2022-10-05 DIAGNOSIS — R5383 Other fatigue: Secondary | ICD-10-CM | POA: Diagnosis not present

## 2022-10-05 DIAGNOSIS — R0602 Shortness of breath: Secondary | ICD-10-CM | POA: Diagnosis not present

## 2022-10-05 DIAGNOSIS — I2510 Atherosclerotic heart disease of native coronary artery without angina pectoris: Secondary | ICD-10-CM | POA: Diagnosis present

## 2022-10-05 LAB — BRAIN NATRIURETIC PEPTIDE: B Natriuretic Peptide: 1007.5 pg/mL — ABNORMAL HIGH (ref 0.0–100.0)

## 2022-10-05 LAB — CBC WITH DIFFERENTIAL/PLATELET
Abs Immature Granulocytes: 0.01 10*3/uL (ref 0.00–0.07)
Basophils Absolute: 0.1 10*3/uL (ref 0.0–0.1)
Basophils Relative: 1 %
Eosinophils Absolute: 0.2 10*3/uL (ref 0.0–0.5)
Eosinophils Relative: 4 %
HCT: 46.6 % (ref 39.0–52.0)
Hemoglobin: 14.3 g/dL (ref 13.0–17.0)
Immature Granulocytes: 0 %
Lymphocytes Relative: 20 %
Lymphs Abs: 1.2 10*3/uL (ref 0.7–4.0)
MCH: 25.4 pg — ABNORMAL LOW (ref 26.0–34.0)
MCHC: 30.7 g/dL (ref 30.0–36.0)
MCV: 82.9 fL (ref 80.0–100.0)
Monocytes Absolute: 0.8 10*3/uL (ref 0.1–1.0)
Monocytes Relative: 12 %
Neutro Abs: 3.9 10*3/uL (ref 1.7–7.7)
Neutrophils Relative %: 63 %
Platelets: 180 10*3/uL (ref 150–400)
RBC: 5.62 MIL/uL (ref 4.22–5.81)
RDW: 23 % — ABNORMAL HIGH (ref 11.5–15.5)
WBC: 6.1 10*3/uL (ref 4.0–10.5)
nRBC: 0 % (ref 0.0–0.2)

## 2022-10-05 LAB — BASIC METABOLIC PANEL
Anion gap: 11 (ref 5–15)
BUN: 25 mg/dL — ABNORMAL HIGH (ref 8–23)
CO2: 21 mmol/L — ABNORMAL LOW (ref 22–32)
Calcium: 9 mg/dL (ref 8.9–10.3)
Chloride: 107 mmol/L (ref 98–111)
Creatinine, Ser: 1.76 mg/dL — ABNORMAL HIGH (ref 0.61–1.24)
GFR, Estimated: 42 mL/min — ABNORMAL LOW (ref >60)
Glucose, Bld: 135 mg/dL — ABNORMAL HIGH (ref 70–99)
Potassium: 4.3 mmol/L (ref 3.5–5.1)
Sodium: 139 mmol/L (ref 135–145)

## 2022-10-05 LAB — TROPONIN I (HIGH SENSITIVITY)
Troponin I (High Sensitivity): 25 ng/L — ABNORMAL HIGH (ref <18)
Troponin I (High Sensitivity): 27 ng/L — ABNORMAL HIGH (ref <18)

## 2022-10-05 LAB — GLUCOSE, CAPILLARY: Glucose-Capillary: 179 mg/dL — ABNORMAL HIGH (ref 70–99)

## 2022-10-05 MED ORDER — SODIUM CHLORIDE 0.9 % IV BOLUS
1000.0000 mL | Freq: Once | INTRAVENOUS | Status: AC
  Administered 2022-10-05: 1000 mL via INTRAVENOUS

## 2022-10-05 MED ORDER — ACETAMINOPHEN 325 MG PO TABS: 650.0000 mg | ORAL_TABLET | ORAL | Status: DC | PRN

## 2022-10-05 MED ORDER — ATORVASTATIN CALCIUM 80 MG PO TABS
80.0000 mg | ORAL_TABLET | Freq: Every day | ORAL | Status: DC
  Administered 2022-10-06 – 2022-10-07 (×2): 80 mg via ORAL
  Filled 2022-10-05 (×2): qty 1

## 2022-10-05 MED ORDER — INSULIN ASPART 100 UNIT/ML IJ SOLN
0.0000 [IU] | Freq: Three times a day (TID) | INTRAMUSCULAR | Status: DC
  Administered 2022-10-06 – 2022-10-07 (×3): 2 [IU] via SUBCUTANEOUS

## 2022-10-05 MED ORDER — DILTIAZEM HCL-DEXTROSE 125-5 MG/125ML-% IV SOLN (PREMIX)
5.0000 mg/h | INTRAVENOUS | Status: DC
  Administered 2022-10-05: 5 mg/h via INTRAVENOUS
  Administered 2022-10-06: 10 mg/h via INTRAVENOUS
  Administered 2022-10-07: 5 mg/h via INTRAVENOUS
  Filled 2022-10-05 (×3): qty 125

## 2022-10-05 MED ORDER — CARVEDILOL 12.5 MG PO TABS
12.5000 mg | ORAL_TABLET | Freq: Two times a day (BID) | ORAL | Status: DC
  Administered 2022-10-05 – 2022-10-07 (×4): 12.5 mg via ORAL
  Filled 2022-10-05 (×4): qty 1

## 2022-10-05 MED ORDER — CABOZANTINIB S-MALATE 60 MG PO TABS
60.0000 mg | ORAL_TABLET | Freq: Every day | ORAL | 0 refills | Status: DC
  Filled 2022-10-05: qty 30, 30d supply, fill #0

## 2022-10-05 MED ORDER — DILTIAZEM LOAD VIA INFUSION
20.0000 mg | Freq: Once | INTRAVENOUS | Status: AC
  Administered 2022-10-05: 20 mg via INTRAVENOUS
  Filled 2022-10-05: qty 20

## 2022-10-05 MED ORDER — APIXABAN 5 MG PO TABS
5.0000 mg | ORAL_TABLET | Freq: Two times a day (BID) | ORAL | Status: DC
  Administered 2022-10-05 – 2022-10-07 (×4): 5 mg via ORAL
  Filled 2022-10-05 (×4): qty 1

## 2022-10-05 MED ORDER — IOHEXOL 350 MG/ML SOLN
60.0000 mL | Freq: Once | INTRAVENOUS | Status: AC | PRN
  Administered 2022-10-05: 60 mL via INTRAVENOUS

## 2022-10-05 MED ORDER — EMPAGLIFLOZIN 10 MG PO TABS
10.0000 mg | ORAL_TABLET | Freq: Every day | ORAL | Status: DC
  Administered 2022-10-06 – 2022-10-07 (×2): 10 mg via ORAL
  Filled 2022-10-05 (×2): qty 1

## 2022-10-05 MED ORDER — ONDANSETRON HCL 4 MG/2ML IJ SOLN: 4.0000 mg | Freq: Four times a day (QID) | INTRAMUSCULAR | Status: DC | PRN

## 2022-10-05 MED ORDER — LISINOPRIL 20 MG PO TABS
20.0000 mg | ORAL_TABLET | Freq: Every day | ORAL | Status: DC
  Administered 2022-10-06 – 2022-10-07 (×2): 20 mg via ORAL
  Filled 2022-10-05 (×2): qty 1

## 2022-10-05 NOTE — ED Notes (Signed)
ED TO INPATIENT HANDOFF REPORT  ED Nurse Name and Phone #: Albina Billet [phone]  S Name/Age/Gender Matthew Chen 67 y.o. male Room/Bed: 007C/007C  Code Status   Code Status: Full Code  Home/SNF/Other Home Patient oriented to: self, place, time, and situation Is this baseline? Yes   Triage Complete: Triage complete  Chief Complaint Atrial fibrillation [facility]) [I48.91]  Triage Note Pt c/o fatigue and intermittent shortness of breath starting today. Hx of afib - on eliquis. Denies chest pain.    Allergies Allergies  Allergen Reactions   Penicillins Other (See Comments)    Unsure, childhood reaction Has patient had a PCN reaction causing immediate rash, facial/tongue/throat swelling, SOB or lightheadedness with hypotension: Unknown Has patient had a PCN reaction causing severe rash involving mucus membranes or skin necrosis: Unknown Has patient had a PCN reaction that required hospitalization: No Has patient had a PCN reaction occurring within the last 10 years: No If all of the above answers are "NO", then may proceed with Cephalosporin use.     Level of Care/Admitting Diagnosis ED Disposition   ED Disposition: Admit Condition: None Comment: Hospital Area: [place] [[id number]]  Level of Care: Telemetry Cardiac [[id number]]  May place patient in observation at [facility] or [place] if equivalent level of care is available:: Yes  Covid Evaluation: Asymptomatic - no recent exposure (last 10 days) testing not required  Diagnosis: Atrial fibrillation ([place]) [427.31.ICD-9-CM]  Admitting Physician: Ewing Schlein [id number]  Attending Physician: Ewing Schlein [id number]      B Medical/Surgery History Past Medical History:  Diagnosis Date   Adenomatous colon polyp 03/2008   Anginal pain ([place])    Aortic aneurysm, thoracic ([place]) 09/09/2016   4.4 cm by echo 05/2015   Arthritis    "joints in my fingers ache" (07/13/2015)   Bicuspid aortic valve  09/09/2016   Cataract 2019   bilateral eyes   CKD (chronic kidney disease), stage III ([place])    Coronary artery disease    Diabetes mellitus without complication ([place])    Hemorrhoids    Hepatocellular carcinoma ([place])    Hyperlipidemia    Hypertension    Paroxysmal atrial fibrillation ([place])    Peripheral arterial disease ([place])    a. dopppler 05/29/2015 revealed high-grade right popliteal and distal left SFA disease b. LE angio 06/28/2015 revealing high grade calcific dx with distal L SFA and R popliteal artery s/p diamondback orbital rotational atherectomy, PTA of L SFA  c. 07/13/2015 diamondback orbital rotational atherectomy and drug eluting balloon angioplasty of R popliteal artery (P2 segment) using distal protection    Type II diabetes mellitus ([place])    Ulcerative colitis    Past Surgical History:  Procedure Laterality Date   ABDOMINAL AORTOGRAM W/LOWER EXTREMITY N/A 04/27/2018   Procedure: ABDOMINAL AORTOGRAM W/LOWER EXTREMITY;  Surgeon: Lorretta Harp, MD;  Location: [place] [facility];  Service: Cardiovascular;  Laterality: N/A;   COLONOSCOPY     COLONOSCOPY W/ POLYPECTOMY  X 4-5   CORONARY ATHERECTOMY N/A 02/15/2021   Procedure: CORONARY ATHERECTOMY;  Surgeon: Lorretta Harp, MD;  Location: [place] [facility];  Service: Cardiovascular;  Laterality: N/A;  aborted    CORONARY BALLOON ANGIOPLASTY N/A 02/15/2021   Procedure: CORONARY BALLOON ANGIOPLASTY;  Surgeon: Lorretta Harp, MD;  Location: [place] [facility];  Service: Cardiovascular;  Laterality: N/A;   COSMETIC SURGERY  < 2000   "removed birthmark from top of my head"   IR [place]  ADDITIONAL VESSEL  06/08/2021   IR ANGIOGRAM SELECTIVE EACH ADDITIONAL VESSEL  06/08/2021   IR ANGIOGRAM SELECTIVE EACH ADDITIONAL VESSEL  06/08/2021   IR ANGIOGRAM SELECTIVE EACH ADDITIONAL VESSEL  06/08/2021   IR ANGIOGRAM SELECTIVE EACH ADDITIONAL VESSEL  06/21/2021   IR ANGIOGRAM SELECTIVE EACH ADDITIONAL VESSEL   06/21/2021   IR ANGIOGRAM SELECTIVE EACH ADDITIONAL VESSEL  06/21/2021   IR ANGIOGRAM SELECTIVE EACH ADDITIONAL VESSEL  06/21/2021   IR ANGIOGRAM VISCERAL SELECTIVE  06/08/2021   IR ANGIOGRAM VISCERAL SELECTIVE  06/21/2021   IR EMBO ARTERIAL NOT HEMORR HEMANG INC GUIDE ROADMAPPING  06/08/2021   IR EMBO TUMOR ORGAN ISCHEMIA INFARCT INC GUIDE ROADMAPPING  06/21/2021   IR EMBO TUMOR ORGAN ISCHEMIA INFARCT INC GUIDE ROADMAPPING  06/21/2021   IR RADIOLOGIST EVAL & MGMT  05/08/2021   IR RADIOLOGIST EVAL & MGMT  07/26/2021   IR RADIOLOGIST EVAL & MGMT  09/05/2021   IR RADIOLOGIST EVAL & MGMT  12/27/2021   IR US GUIDE VASC ACCESS LEFT  06/21/2021   IR US GUIDE VASC ACCESS RIGHT  06/08/2021   LEFT HEART CATH AND CORONARY ANGIOGRAPHY N/A 02/08/2021   Procedure: LEFT HEART CATH AND CORONARY ANGIOGRAPHY;  Surgeon: Lorretta Harp, MD;  Location: [place] [facility];  Service: Cardiovascular;  Laterality: N/A;   PERIPHERAL VASCULAR ATHERECTOMY  04/27/2018   Procedure: PERIPHERAL VASCULAR ATHERECTOMY;  Surgeon: Lorretta Harp, MD;  Location: [place] [facility];  Service: Cardiovascular;;  right popliteal artery   PERIPHERAL VASCULAR CATHETERIZATION N/A 06/29/2015   Procedure: Lower Extremity Angiography;  Surgeon: Lorretta Harp, MD;  Location: [place] [facility];  Service: Cardiovascular;  Laterality: N/A;   PERIPHERAL VASCULAR CATHETERIZATION N/A 07/13/2015   Procedure: Lower Extremity Angiography;  Surgeon: Lorretta Harp, MD;  Location: [place] [facility];  Service: Cardiovascular;  Laterality: N/A;   PERIPHERAL VASCULAR CATHETERIZATION  07/13/2015   Procedure: Peripheral Vascular Atherectomy;  Surgeon: Lorretta Harp, MD;  Location: [place] [facility];  Service: Cardiovascular;;   PERIPHERAL VASCULAR INTERVENTION  04/27/2018   Procedure: PERIPHERAL VASCULAR INTERVENTION;  Surgeon: Lorretta Harp, MD;  Location: [place] [facility];  Service: Cardiovascular;;  right popliteal artery    POLYPECTOMY     TONSILLECTOMY     UPPER GASTROINTESTINAL ENDOSCOPY       A IV Location/Drains/Wounds Patient Lines/Drains/Airways Status     Active Line/Drains/Airways     Name Placement date Placement time Site Days   Peripheral IV 10/05/22 20 G Anterior;Proximal;Right Forearm 10/05/22  1734  Forearm  less than 1            Intake/Output Last 24 hours No intake or output data in the 24 hours ending 10/05/22 2138  Labs/Imaging Results for orders placed or performed during the hospital encounter of 10/05/22 (from the past 48 hour(s))  Basic metabolic panel     Status: Abnormal   Collection Time: 10/05/22  5:15 PM  Result Value Ref Range   Sodium 139 135 - 145 mmol/L   Potassium 4.3 3.5 - 5.1 mmol/L   Chloride 107 98 - 111 mmol/L   CO2 21 (L) 22 - 32 mmol/L   Glucose, Bld 135 (H) 70 - 99 mg/dL    Comment: Glucose reference range applies only to samples taken after fasting for at least 8 hours.   BUN 25 (H) 8 - 23 mg/dL   Creatinine, Ser 1.76 (H) 0.61 - 1.24 mg/dL   Calcium 9.0 8.9 - 10.3 mg/dL  GFR, Estimated 42 (L) >60 mL/min    Comment: (NOTE) Calculated using the CKD-EPI Creatinine Equation (2021)    Anion gap 11 5 - 15    Comment: Performed at [facility], [place] [address] [place], [place] [address]  CBC with Differential     Status: Abnormal   Collection Time: 10/05/22  5:15 PM  Result Value Ref Range   WBC 6.1 4.0 - 10.5 K/uL   RBC 5.62 4.22 - 5.81 MIL/uL   Hemoglobin 14.3 13.0 - 17.0 g/dL   HCT 46.6 39.0 - 52.0 %   MCV 82.9 80.0 - 100.0 fL   MCH 25.4 (L) 26.0 - 34.0 pg   MCHC 30.7 30.0 - 36.0 g/dL   RDW 23.0 (H) 11.5 - 15.5 %   Platelets 180 150 - 400 K/uL    Comment: REPEATED TO VERIFY   nRBC 0.0 0.0 - 0.2 %   Neutrophils Relative % 63 %   Neutro Abs 3.9 1.7 - 7.7 K/uL   Lymphocytes Relative 20 %   Lymphs Abs 1.2 0.7 - 4.0 K/uL   Monocytes Relative 12 %   Monocytes Absolute 0.8 0.1 - 1.0 K/uL   Eosinophils Relative 4 %   Eosinophils  Absolute 0.2 0.0 - 0.5 K/uL   Basophils Relative 1 %   Basophils Absolute 0.1 0.0 - 0.1 K/uL   Immature Granulocytes 0 %   Abs Immature Granulocytes 0.01 0.00 - 0.07 K/uL   Polychromasia PRESENT     Comment: Performed at [facility], [place] [address] [place], [place] [address]  Troponin I (High Sensitivity)     Status: Abnormal   Collection Time: 10/05/22  5:15 PM  Result Value Ref Range   Troponin I (High Sensitivity) 25 (H) <18 ng/L    Comment: (NOTE) Elevated high sensitivity troponin I (hsTnI) values and significant  changes across serial measurements may suggest ACS but many other  chronic and acute conditions are known to elevate hsTnI results.  Refer to the "Links" section for chest pain algorithms and additional  guidance. Performed at [facility], [place] [address], [place], [place] [address]   Brain natriuretic peptide     Status: Abnormal   Collection Time: 10/05/22  6:00 PM  Result Value Ref Range   B Natriuretic Peptide 1,007.5 (H) 0.0 - 100.0 pg/mL    Comment: Performed at [place] [address], [place], [place] [address]  Troponin I (High Sensitivity)     Status: Abnormal   Collection Time: 10/05/22  7:20 PM  Result Value Ref Range   Troponin I (High Sensitivity) 27 (H) <18 ng/L    Comment: (NOTE) Elevated high sensitivity troponin I (hsTnI) values and significant  changes across serial measurements may suggest ACS but many other  chronic and acute conditions are known to elevate hsTnI results.  Refer to the "Links" section for chest pain algorithms and additional  guidance. Performed at [facility], [place] [address], [place], [place] [address]    CT Angio Chest PE W/Cm &/Or Wo Cm  Result Date: 10/05/2022 CLINICAL DATA:  Dyspnea, atrial fibrillation EXAM: CT ANGIOGRAPHY CHEST WITH CONTRAST TECHNIQUE: Multidetector CT imaging of the chest was performed using the standard protocol during bolus administration of intravenous contrast.  Multiplanar CT image reconstructions and MIPs were obtained to evaluate the vascular anatomy. RADIATION DOSE REDUCTION: This exam was performed according to the departmental dose-optimization program which includes automated exposure control, adjustment of the mA and/or  kV according to patient size and/or use of iterative reconstruction technique. CONTRAST:  43m OMNIPAQUE IOHEXOL 350 MG/ML SOLN COMPARISON:  01/30/2021 FINDINGS: Cardiovascular: Adequate opacification of the pulmonary arterial tree. No intraluminal filling defect identified to suggest acute pulmonary embolism. Central pulmonary arteries are enlarged in keeping with changes of pulmonary arterial hypertension. Extensive multi-vessel coronary artery calcification. Calcification of the aortic valve leaflets noted. Cardiac size within normal limits. No pericardial effusion. Mild atherosclerotic calcification within the thoracic aorta. No aortic aneurysm. Mediastinum/Nodes: No enlarged mediastinal, hilar, or axillary lymph nodes. Thyroid gland, trachea, and esophagus demonstrate no significant findings. Lungs/Pleura: Lungs are clear. No pneumothorax or pleural effusion. Bronchial wall thickening is present in keeping with airway inflammation. No central obstructing lesion. Upper Abdomen: Cholelithiasis without pericholecystic inflammatory change. No acute abnormality within the visualized upper abdomen. Musculoskeletal: Osseous structures are age-appropriate. No acute bone abnormality. No lytic or blastic bone lesion. Review of the MIP images confirms the above findings. IMPRESSION: 1. No pulmonary embolism. 2. Extensive multi-vessel coronary artery calcification. 3. Morphologic changes in keeping with pulmonary arterial hypertension. 4. Calcification of the aortic valve leaflets. Echocardiography may be helpful to assess the degree of valvular dysfunction. 5. Bronchial wall thickening in keeping with airway inflammation. No central obstructing lesion. 6.  Cholelithiasis. Aortic Atherosclerosis (ICD10-I70.0). Electronically Signed   By: AFidela SalisburyM.D.   On: 10/05/2022 20:06   DG Chest 2 View  Result Date: 10/05/2022 CLINICAL DATA:  Shortness of breath and fatigue. EXAM: CHEST - 2 VIEW COMPARISON:  March 27, 2022 FINDINGS: The heart size and mediastinal contours are within normal limits. Both lungs are clear. The visualized skeletal structures are unremarkable. IMPRESSION: No active cardiopulmonary disease. Electronically Signed   By: TVirgina NorfolkM.D.   On: 10/05/2022 18:12    Pending Labs Unresulted Labs (From admission, onward)     Start     Ordered   10/06/22 0500  CBC  Tomorrow morning,   R        10/05/22 2105   10/06/22 [id number] Basic metabolic panel  Tomorrow morning,   R        10/05/22 2105   10/06/22 0500  Hepatic function panel  Tomorrow morning,   R        10/05/22 2105            Vitals/Pain Today's Vitals   10/05/22 1714 10/05/22 1720 10/05/22 2000 10/05/22 2123  BP:  120/60 126/83   Pulse: (Abnormal) 116  (Abnormal) 138   Resp: 20  15   Temp: 98.3 F (36.8 C)   97.6 F (36.4 C)  TempSrc: Oral   Oral  SpO2: 96%  96%     Isolation Precautions No active isolations  Medications Medications  diltiazem (CARDIZEM) 1 mg/mL load via infusion 20 mg (20 mg Intravenous Bolus from Bag 10/05/22 1847)    And  diltiazem (CARDIZEM) 125 mg in dextrose 5% 125 mL (1 mg/mL) infusion (5 mg/hr Intravenous New Bag/Given 10/05/22 1848)  acetaminophen (TYLENOL) tablet 650 mg (has no administration in time range)  ondansetron (ZOFRAN) injection 4 mg (has no administration in time range)  sodium chloride 0.9 % bolus 1,000 mL (0 mLs Intravenous Stopped 10/05/22 1931)  iohexol (OMNIPAQUE) 350 MG/ML injection 60 mL (60 mLs Intravenous Contrast Given 10/05/22 1933)    Mobility walks     Focused Assessments Cardiac Assessment Handoff:    No results found for: "CKTOTAL", "CKMB", "CKMBINDEX", "TROPONINI" No results found for:  "DDIMER" Does the Patient currently have  chest pain? No    R Recommendations: See Admitting Provider Note  Report given to:   Additional Notes: Alert and oriented x 4, walks independently to the BR. Cardizem drip,

## 2022-10-05 NOTE — ED Triage Notes (Signed)
Pt c/o fatigue and intermittent shortness of breath starting today. Hx of afib - on eliquis. Denies chest pain.

## 2022-10-05 NOTE — ED Provider Notes (Signed)
[facility] Provider Note   CSN: [id number] Arrival date & time: 10/05/22  1704     History  Chief Complaint  Patient presents with   Shortness of Breath    Matthew Chen is a 67 y.o. male.  67 year old male presents with acute onset of dyspnea over the past day or so.  Patient also endorses going back into atrial fibrillation.  Patient hospitalized recently for this last week and was given Tikosyn which she states improved his symptoms but then it returned.  He does take Eliquis chronically and has been compliant with this.  States his dyspnea is exertional.  No fever or cough.  Also endorses increased fatigue.  Denies any changes to his bowels.       Home Medications Prior to Admission medications   Medication Sig Start Date End Date Taking? Authorizing Provider  acetaminophen (TYLENOL) 500 MG tablet Take 1 tablet (500 mg total) by mouth every 6 (six) hours as needed. 03/27/22   Varney Biles, MD  amiodarone (PACERONE) 200 MG tablet Take 1 tablet (200 mg total) by mouth 2 (two) times daily. 10/11/22   [name], [name]  apixaban (ELIQUIS) 5 MG TABS tablet Take 1 tablet (5 mg total) by mouth 2 (two) times daily. 08/13/22   Lorretta Harp, MD  aspirin EC 81 MG tablet Take 1 tablet (81 mg total) by mouth daily. 06/01/15   Skeet Latch, MD  atorvastatin (LIPITOR) 80 MG tablet TAKE 1 TABLET BY MOUTH EVERY DAY 08/06/22   Lorretta Harp, MD  cabozantinib (CABOMETYX) 60 MG tablet Take 1 tablet (60 mg total) by mouth daily. Take on an empty stomach, 1 hour before or 2 hours after meals. 10/08/22   Ladell Pier, MD  carvedilol (COREG) 12.5 MG tablet TAKE 3 TABLETS (37.5 MG) BY MOUTH 2 TIMES DAILY 09/02/22   Susy Frizzle, MD  cyanocobalamin (VITAMIN B12) 1000 MCG tablet Take 1,000 mcg by mouth daily.    [provider]  Dulaglutide (TRULICITY) 3 0000000 SOPN Inject 3 mg as directed once a week. 09/02/22    Susy Frizzle, MD  fenofibrate 160 MG tablet Take 1 tablet (160 mg total) by mouth daily. 03/22/22   Susy Frizzle, MD  glucose blood (ONETOUCH ULTRA) test strip CHECK BLOOD SUGAR TWICE A DAY 03/19/21   Susy Frizzle, MD  HYDROcodone-acetaminophen (NORCO) 7.5-325 MG tablet Take 1 tablet by mouth every 6 (six) hours as needed for moderate pain. 04/04/22   Susy Frizzle, MD  Insulin Pen Needle (B-D UF III MINI PEN NEEDLES) 31G X 5 MM MISC Use daily with Victoza 05/31/22   Susy Frizzle, MD  JARDIANCE 25 MG TABS tablet TAKE 1 TABLET BY MOUTH DAILY BEFORE BREAKFAST. 09/02/22   Susy Frizzle, MD  lisinopril (ZESTRIL) 20 MG tablet Take 1 tablet (20 mg total) by mouth daily. 02/20/22   Almyra Deforest, PA  Omega-3 Fatty Acids (FISH OIL) 1000 MG CAPS Take 1,000 mg by mouth 2 (two) times daily.    [provider]  pantoprazole (PROTONIX) 40 MG tablet Take 1 tablet (40 mg total) by mouth 2 (two) times daily. 03/22/22   Susy Frizzle, MD  pioglitazone (ACTOS) 30 MG tablet TAKE 1 TABLET BY MOUTH EVERY DAY. STRENGTH: 30 MG 08/26/22   Susy Frizzle, MD  sildenafil (VIAGRA) 100 MG tablet Take 0.5-1 tablets (50-100 mg total) by mouth daily as needed for erectile dysfunction. 05/30/22  Susy Frizzle, MD  vedolizumab (ENTYVIO) 300 MG injection Inject 300 mg as directed See admin instructions. Every 2 months    [provider]      Allergies    Penicillins    Review of Systems   Review of Systems  All other systems reviewed and are negative.   Physical Exam Updated Vital Signs BP 120/60 (BP Location: Right Arm) Comment: Faint and Tachy  Pulse (!) 116   Temp 98.3 F (36.8 C) (Oral)   Resp 20   SpO2 96%  Physical Exam Vitals and nursing note reviewed.  Constitutional:      General: He is not in acute distress.    Appearance: Normal appearance. He is well-developed. He is not toxic-appearing.  HENT:     Head: Normocephalic and atraumatic.  Eyes:     General:  Lids are normal.     Conjunctiva/sclera: Conjunctivae normal.     Pupils: Pupils are equal, round, and reactive to light.  Neck:     Thyroid: No thyroid mass.     Trachea: No tracheal deviation.  Cardiovascular:     Rate and Rhythm: Tachycardia present. Rhythm irregular.     Heart sounds: Normal heart sounds. No murmur heard.    No gallop.  Pulmonary:     Effort: Pulmonary effort is normal. No respiratory distress.     Breath sounds: Normal breath sounds. No stridor. No decreased breath sounds, wheezing, rhonchi or rales.  Abdominal:     General: There is no distension.     Palpations: Abdomen is soft.     Tenderness: There is no abdominal tenderness. There is no rebound.  Musculoskeletal:        General: No tenderness. Normal range of motion.     Cervical back: Normal range of motion and neck supple.  Skin:    General: Skin is warm and dry.     Findings: No abrasion or rash.  Neurological:     Mental Status: He is alert and oriented to person, place, and time. Mental status is at baseline.     GCS: GCS eye subscore is 4. GCS verbal subscore is 5. GCS motor subscore is 6.     Cranial Nerves: No cranial nerve deficit.     Sensory: No sensory deficit.     Motor: Motor function is intact.  Psychiatric:        Attention and Perception: Attention normal.        Speech: Speech normal.        Behavior: Behavior normal.     ED Results / Procedures / Treatments   Labs (all labs ordered are listed, but only abnormal results are displayed) Labs Reviewed  BASIC METABOLIC PANEL  CBC WITH DIFFERENTIAL/PLATELET  TROPONIN I (HIGH SENSITIVITY)    EKG EKG Interpretation  Date/Time:  Saturday October 05 2022 18:07:57 EST Ventricular Rate:  99 PR Interval:    QRS Duration: 90 QT Interval:  363 QTC Calculation: 466 R Axis:   38 Text Interpretation: Atrial fibrillation Confirmed by Lacretia Leigh ([id number]) on 10/05/2022 7:37:29 PM  Radiology No results  found.  Procedures Procedures    Medications Ordered in ED Medications  sodium chloride 0.9 % bolus 1,000 mL (has no administration in time range)    ED Course/ Medical Decision Making/ A&P                             Medical Decision Making Amount and/or  Complexity of Data Reviewed Labs: ordered. ECG/medicine tests: ordered.  Risk Prescription drug management.   Patient is EKG per interpretation shows atrial fibrillation.  Patient has complex history of A-fib due to his chemotherapy agents.  Discussed with cardiology fellow recommends patient receive Cardizem.  Patient given Cardizem bolus and drip and his rate is more controlled at this time.  Patient still complains of feeling weak at this time.  His chest x-ray per interpretation did not show any active disease.  Mild elevation of his troponin likely from demand.  He does have his elevated BNP over thousand.  Patient endorses continued to feel weak.  Will consult cardiology for admission  CRITICAL CARE Performed by: Leota Jacobsen Total critical care time: 60 minutes Critical care time was exclusive of separately billable procedures and treating other patients. Critical care was necessary to treat or prevent imminent or life-threatening deterioration. Critical care was time spent personally by me on the following activities: development of treatment plan with patient and/or surrogate as well as nursing, discussions with consultants, evaluation of patient's response to treatment, examination of patient, obtaining history from patient or surrogate, ordering and performing treatments and interventions, ordering and review of laboratory studies, ordering and review of radiographic studies, pulse oximetry and re-evaluation of patient's condition.         Final Clinical Impression(s) / ED Diagnoses Final diagnoses:  None    Rx / DC Orders ED Discharge Orders     None         Lacretia Leigh, MD 10/05/22 2004

## 2022-10-05 NOTE — H&P (Signed)
Cardiology Admission History and Physical   Patient ID: Matthew Chen MRN: [id number]; DOB: Dec 16, 1955   Admission date: 10/05/2022  PCP:  Matthew Frizzle, MD   [facility] Cardiologist:  Matthew Burow, MD  Electrophysiologist:  Matthew Meredith Leeds, MD       Chief Complaint:  shortness of breath and fatigue  Patient Profile:   Matthew Chen is a 67 y.o. male with PMHx of PAF, HTN, DM, CAD, CKD, PVD, hepatocellular carcinoma who is being seen 10/05/2022 for the evaluation of atrial fibrillation.  History of Present Illness:   [name] presented to the ER complaining of fatigue and shortness of breath. This correlates with the onset of atrial fibrillation. Recently, he was admitted on 02/13 for Tikosyn load but this resulted in significant Qtc prolongation which was felt was partly due to concomitant lenvatinib use. Outpatient plan was to try amiodarone after waiting for Lenvatinib washout for caution. He visited the EP office yesterday and his Qtc had normalized (reported to be 460) while in sinus brady (HR 53 bpm). I do not have this ECG available for review. He has been complaint with his anticoagulation without interruption. On arrival, HR of 138 bpm, BP 126/83 mmHg, RR 15.  Past Medical History:  Diagnosis Date   Adenomatous colon polyp 03/2008   Anginal pain ([place])    Aortic aneurysm, thoracic ([place]) 09/09/2016   4.4 cm by echo 05/2015   Arthritis    "joints in my fingers ache" (07/13/2015)   Bicuspid aortic valve 09/09/2016   Cataract 2019   bilateral eyes   CKD (chronic kidney disease), stage III ([place])    Coronary artery disease    Diabetes mellitus without complication ([place])    Hemorrhoids    Hepatocellular carcinoma ([place])    Hyperlipidemia    Hypertension    Paroxysmal atrial fibrillation ([place])    Peripheral arterial disease ([place])    a. dopppler 05/29/2015 revealed high-grade right popliteal and distal left SFA disease b. LE angio 06/28/2015  revealing high grade calcific dx with distal L SFA and R popliteal artery s/p diamondback orbital rotational atherectomy, PTA of L SFA  c. 07/13/2015 diamondback orbital rotational atherectomy and drug eluting balloon angioplasty of R popliteal artery (P2 segment) using distal protection    Type II diabetes mellitus ([place])    Ulcerative colitis     Past Surgical History:  Procedure Laterality Date   ABDOMINAL AORTOGRAM W/LOWER EXTREMITY N/A 04/27/2018   Procedure: ABDOMINAL AORTOGRAM W/LOWER EXTREMITY;  Surgeon: Lorretta Harp, MD;  Location: [facility];  Service: Cardiovascular;  Laterality: N/A;   COLONOSCOPY     COLONOSCOPY W/ POLYPECTOMY  X 4-5   CORONARY ATHERECTOMY N/A 02/15/2021   Procedure: CORONARY ATHERECTOMY;  Surgeon: Lorretta Harp, MD;  Location: [facility];  Service: Cardiovascular;  Laterality: N/A;  aborted    CORONARY BALLOON ANGIOPLASTY N/A 02/15/2021   Procedure: CORONARY BALLOON ANGIOPLASTY;  Surgeon: Lorretta Harp, MD;  Location: [facility];  Service: Cardiovascular;  Laterality: N/A;   COSMETIC SURGERY  < 2000   "removed birthmark from top of my head"   IR ANGIOGRAM SELECTIVE EACH ADDITIONAL VESSEL  06/08/2021   IR ANGIOGRAM SELECTIVE EACH ADDITIONAL VESSEL  06/08/2021   IR ANGIOGRAM SELECTIVE EACH ADDITIONAL VESSEL  06/08/2021   IR ANGIOGRAM SELECTIVE EACH ADDITIONAL VESSEL  06/08/2021   IR ANGIOGRAM SELECTIVE EACH ADDITIONAL VESSEL  06/21/2021   IR ANGIOGRAM SELECTIVE EACH ADDITIONAL VESSEL  06/21/2021   IR ANGIOGRAM SELECTIVE EACH ADDITIONAL VESSEL  06/21/2021   IR ANGIOGRAM SELECTIVE EACH ADDITIONAL VESSEL  06/21/2021   IR ANGIOGRAM VISCERAL SELECTIVE  06/08/2021   IR ANGIOGRAM VISCERAL SELECTIVE  06/21/2021   IR EMBO ARTERIAL NOT HEMORR HEMANG INC GUIDE ROADMAPPING  06/08/2021   IR EMBO TUMOR ORGAN ISCHEMIA INFARCT INC GUIDE ROADMAPPING  06/21/2021   IR EMBO TUMOR ORGAN ISCHEMIA INFARCT INC GUIDE ROADMAPPING  06/21/2021   IR  RADIOLOGIST EVAL & MGMT  05/08/2021   IR RADIOLOGIST EVAL & MGMT  07/26/2021   IR RADIOLOGIST EVAL & MGMT  09/05/2021   IR RADIOLOGIST EVAL & MGMT  12/27/2021   IR US GUIDE VASC ACCESS LEFT  06/21/2021   IR US GUIDE VASC ACCESS RIGHT  06/08/2021   LEFT HEART CATH AND CORONARY ANGIOGRAPHY N/A 02/08/2021   Procedure: LEFT HEART CATH AND CORONARY ANGIOGRAPHY;  Surgeon: Lorretta Harp, MD;  Location: [facility];  Service: Cardiovascular;  Laterality: N/A;   PERIPHERAL VASCULAR ATHERECTOMY  04/27/2018   Procedure: PERIPHERAL VASCULAR ATHERECTOMY;  Surgeon: Lorretta Harp, MD;  Location: [facility];  Service: Cardiovascular;;  right popliteal artery   PERIPHERAL VASCULAR CATHETERIZATION N/A 06/29/2015   Procedure: Lower Extremity Angiography;  Surgeon: Lorretta Harp, MD;  Location: [facility];  Service: Cardiovascular;  Laterality: N/A;   PERIPHERAL VASCULAR CATHETERIZATION N/A 07/13/2015   Procedure: Lower Extremity Angiography;  Surgeon: Lorretta Harp, MD;  Location: [facility];  Service: Cardiovascular;  Laterality: N/A;   PERIPHERAL VASCULAR CATHETERIZATION  07/13/2015   Procedure: Peripheral Vascular Atherectomy;  Surgeon: Lorretta Harp, MD;  Location: [facility];  Service: Cardiovascular;;   PERIPHERAL VASCULAR INTERVENTION  04/27/2018   Procedure: PERIPHERAL VASCULAR INTERVENTION;  Surgeon: Lorretta Harp, MD;  Location: [facility];  Service: Cardiovascular;;  right popliteal artery   POLYPECTOMY     TONSILLECTOMY     UPPER GASTROINTESTINAL ENDOSCOPY       Medications Prior to Admission: Prior to Admission medications   Medication Sig Start Date End Date Taking? Authorizing Provider  acetaminophen (TYLENOL) 500 MG tablet Take 1 tablet (500 mg total) by mouth every 6 (six) hours as needed. 03/27/22   Varney Biles, MD  amiodarone (PACERONE) 200 MG tablet Take 1 tablet (200 mg total) by mouth 2 (two) times daily. 10/11/22    [name], [name]  apixaban (ELIQUIS) 5 MG TABS tablet Take 1 tablet (5 mg total) by mouth 2 (two) times daily. 08/13/22   Lorretta Harp, MD  aspirin EC 81 MG tablet Take 1 tablet (81 mg total) by mouth daily. 06/01/15   Skeet Latch, MD  atorvastatin (LIPITOR) 80 MG tablet TAKE 1 TABLET BY MOUTH EVERY DAY 08/06/22   Lorretta Harp, MD  cabozantinib (CABOMETYX) 60 MG tablet Take 1 tablet (60 mg total) by mouth daily. Take on an empty stomach, 1 hour before or 2 hours after meals. 10/08/22   Ladell Pier, MD  carvedilol (COREG) 12.5 MG tablet TAKE 3 TABLETS (37.5 MG) BY MOUTH 2 TIMES DAILY 09/02/22   Matthew Frizzle, MD  cyanocobalamin (VITAMIN B12) 1000 MCG tablet Take 1,000 mcg by mouth daily.    [provider]  Dulaglutide (TRULICITY) 3 0000000 SOPN Inject 3 mg as directed once a week. 09/02/22   Matthew Frizzle, MD  fenofibrate 160 MG tablet Take 1 tablet (160 mg total) by mouth daily. 03/22/22   Jenna Luo  T, MD  glucose blood (ONETOUCH ULTRA) test strip CHECK BLOOD SUGAR TWICE A DAY 03/19/21   Matthew Frizzle, MD  HYDROcodone-acetaminophen (NORCO) 7.5-325 MG tablet Take 1 tablet by mouth every 6 (six) hours as needed for moderate pain. 04/04/22   Matthew Frizzle, MD  Insulin Pen Needle (B-D UF III MINI PEN NEEDLES) 31G X 5 MM MISC Use daily with Victoza 05/31/22   Matthew Frizzle, MD  JARDIANCE 25 MG TABS tablet TAKE 1 TABLET BY MOUTH DAILY BEFORE BREAKFAST. 09/02/22   Matthew Frizzle, MD  lisinopril (ZESTRIL) 20 MG tablet Take 1 tablet (20 mg total) by mouth daily. 02/20/22   Almyra Deforest, PA  Omega-3 Fatty Acids (FISH OIL) 1000 MG CAPS Take 1,000 mg by mouth 2 (two) times daily.    [provider]  pantoprazole (PROTONIX) 40 MG tablet Take 1 tablet (40 mg total) by mouth 2 (two) times daily. 03/22/22   Matthew Frizzle, MD  pioglitazone (ACTOS) 30 MG tablet TAKE 1 TABLET BY MOUTH EVERY DAY. STRENGTH: 30 MG 08/26/22   Matthew Frizzle, MD   sildenafil (VIAGRA) 100 MG tablet Take 0.5-1 tablets (50-100 mg total) by mouth daily as needed for erectile dysfunction. 05/30/22   Matthew Frizzle, MD  vedolizumab (ENTYVIO) 300 MG injection Inject 300 mg as directed See admin instructions. Every 2 months    [provider]     Allergies:    Allergies  Allergen Reactions   Penicillins Other (See Comments)    Unsure, childhood reaction Has patient had a PCN reaction causing immediate rash, facial/tongue/throat swelling, SOB or lightheadedness with hypotension: Unknown Has patient had a PCN reaction causing severe rash involving mucus membranes or skin necrosis: Unknown Has patient had a PCN reaction that required hospitalization: No Has patient had a PCN reaction occurring within the last 10 years: No If all of the above answers are "NO", then may proceed with Cephalosporin use.     Social History:   Social History   Socioeconomic History   Marital status: Married    Spouse name: Ahad Argomaniz   Number of children: 2   Years of education: Not on file   Highest education level: Not on file  Occupational History   Occupation: Truck Education administrator: Counsellor of Librarian, academic  Tobacco Use   Smoking status: Former    Packs/day: 1.50    Years: 25.00    Total pack years: 37.50    Types: Cigarettes    Quit date: 08/12/1996    Years since quitting: 26.1   Smokeless tobacco: Never   Tobacco comments:    Former smoker 09/17/2022  Vaping Use   Vaping Use: Never used  Substance and Sexual Activity   Alcohol use: Yes    Alcohol/week: 1.0 standard drink of alcohol    Types: 1 Cans of beer per week    Comment: social use   Drug use: No   Sexual activity: Yes  Other Topics Concern   Not on file  Social History Narrative   ** Merged History Encounter **       Social Determinants of Health   Financial Resource Strain: Low Risk  (08/17/2021)   Overall Financial Resource Strain (CARDIA)    Difficulty of Paying Living  Expenses: Not very hard  Food Insecurity: No Food Insecurity (09/24/2022)   Hunger Vital Sign    Worried About Running Out of Food in the Last Year: Never true    Ran Out  of Food in the Last Year: Never true  Transportation Needs: No Transportation Needs (09/24/2022)   PRAPARE - Hydrologist (Medical): No    Lack of Transportation (Non-Medical): No  Physical Activity: Not on file  Stress: Not on file  Social Connections: Not on file  Intimate Partner Violence: Not At Risk (09/24/2022)   Humiliation, Afraid, Rape, and Kick questionnaire    Fear of Current or Ex-Partner: No    Emotionally Abused: No    Physically Abused: No    Sexually Abused: No    Family History:   The patient's family history includes Colon cancer in his maternal grandmother; Colon polyps in his mother; Diabetes in his maternal aunt and mother; Heart disease in his father. There is no history of Esophageal cancer, Stomach cancer, or Rectal cancer.    ROS:  Please see the history of present illness. All other ROS reviewed and negative.     Physical Exam/Data:   Vitals:   10/05/22 1714 10/05/22 1720 10/05/22 2000  BP:  120/60 126/83  Pulse: (!) 116  (!) 138  Resp: 20  15  Temp: 98.3 F (36.8 C)    TempSrc: Oral    SpO2: 96%  96%   No intake or output data in the 24 hours ending 10/05/22 2009    10/04/2022    9:18 AM 10/02/2022   11:17 AM 09/24/2022    3:00 PM  Last 3 Weights  Weight (lbs) 210 lb 6.4 oz 213 lb 203 lb 14.8 oz  Weight (kg) 95.437 kg 96.616 kg 92.5 kg     There is no height or weight on file to calculate BMI.  General:  Well nourished, well developed, in no acute distress HEENT: normal Neck: no JVD Vascular: No carotid bruits; Distal pulses 2+ bilaterally   Cardiac:  irregular rhythm Lungs:  clear to auscultation bilaterally, no wheezing, rhonchi or rales  Abd: soft, nontender, no hepatomegaly  Ext: no edema Musculoskeletal:  No deformities, BUE and BLE  strength normal and equal Skin: warm and dry  Neuro:  CNs 2-12 intact, no focal abnormalities noted Psych:  Normal affect    EKG:  The ECG that was done was personally reviewed and demonstrates afib  Relevant CV Studies:  Laboratory Data:  High Sensitivity Troponin:   Recent Labs  Lab 10/05/22 1715  TROPONINIHS 25*      Chemistry Recent Labs  Lab 10/05/22 1715  NA 139  K 4.3  CL 107  CO2 21*  GLUCOSE 135*  BUN 25*  CREATININE 1.76*  CALCIUM 9.0  GFRNONAA 42*  ANIONGAP 11    No results for input(s): "PROT", "ALBUMIN", "AST", "ALT", "ALKPHOS", "BILITOT" in the last 168 hours. Lipids No results for input(s): "CHOL", "TRIG", "HDL", "LABVLDL", "LDLCALC", "CHOLHDL" in the last 168 hours. Hematology Recent Labs  Lab 10/05/22 1715  WBC 6.1  RBC 5.62  HGB 14.3  HCT 46.6  MCV 82.9  MCH 25.4*  MCHC 30.7  RDW 23.0*  PLT 180   Thyroid No results for input(s): "TSH", "FREET4" in the last 168 hours. BNP Recent Labs  Lab 10/05/22 1800  BNP 1,007.5*    DDimer No results for input(s): "DDIMER" in the last 168 hours.   Radiology/Studies:  CT Angio Chest PE W/Cm &/Or Wo Cm  Result Date: 10/05/2022 CLINICAL DATA:  Dyspnea, atrial fibrillation EXAM: CT ANGIOGRAPHY CHEST WITH CONTRAST TECHNIQUE: Multidetector CT imaging of the chest was performed using the standard protocol during bolus administration of  intravenous contrast. Multiplanar CT image reconstructions and MIPs were obtained to evaluate the vascular anatomy. RADIATION DOSE REDUCTION: This exam was performed according to the departmental dose-optimization program which includes automated exposure control, adjustment of the mA and/or kV according to patient size and/or use of iterative reconstruction technique. CONTRAST:  67m OMNIPAQUE IOHEXOL 350 MG/ML SOLN COMPARISON:  01/30/2021 FINDINGS: Cardiovascular: Adequate opacification of the pulmonary arterial tree. No intraluminal filling defect identified to suggest acute  pulmonary embolism. Central pulmonary arteries are enlarged in keeping with changes of pulmonary arterial hypertension. Extensive multi-vessel coronary artery calcification. Calcification of the aortic valve leaflets noted. Cardiac size within normal limits. No pericardial effusion. Mild atherosclerotic calcification within the thoracic aorta. No aortic aneurysm. Mediastinum/Nodes: No enlarged mediastinal, hilar, or axillary lymph nodes. Thyroid gland, trachea, and esophagus demonstrate no significant findings. Lungs/Pleura: Lungs are clear. No pneumothorax or pleural effusion. Bronchial wall thickening is present in keeping with airway inflammation. No central obstructing lesion. Upper Abdomen: Cholelithiasis without pericholecystic inflammatory change. No acute abnormality within the visualized upper abdomen. Musculoskeletal: Osseous structures are age-appropriate. No acute bone abnormality. No lytic or blastic bone lesion. Review of the MIP images confirms the above findings. IMPRESSION: 1. No pulmonary embolism. 2. Extensive multi-vessel coronary artery calcification. 3. Morphologic changes in keeping with pulmonary arterial hypertension. 4. Calcification of the aortic valve leaflets. Echocardiography may be helpful to assess the degree of valvular dysfunction. 5. Bronchial wall thickening in keeping with airway inflammation. No central obstructing lesion. 6. Cholelithiasis. Aortic Atherosclerosis (ICD10-I70.0). Electronically Signed   By: AFidela SalisburyM.D.   On: 10/05/2022 20:06   DG Chest 2 View  Result Date: 10/05/2022 CLINICAL DATA:  Shortness of breath and fatigue. EXAM: CHEST - 2 VIEW COMPARISON:  March 27, 2022 FINDINGS: The heart size and mediastinal contours are within normal limits. Both lungs are clear. The visualized skeletal structures are unremarkable. IMPRESSION: No active cardiopulmonary disease. Electronically Signed   By: TVirgina NorfolkM.D.   On: 10/05/2022 18:12     Assessment  and Plan:   67year old male with PMHx of PAF, HTN, DM, CAD, CKD, PVD, hepatocellular carcinoma who is being seen 10/05/2022 for the evaluation of symptomatic atrial fibrillation. The onset was today during the morning and it was described as fatigue, lightheadedness and shortness of breath. These are his typical afib symptoms. There was a recent attempt for rhythm control with Tykosin which was stopped due to Qtc prolongation. Some of it it was though to be related to dru-drug interaction with Lenvatinib. Discussion was held with Oncologist and the plan was to switch him to Cabozatinib on 10/07/22. His last dose of Lenvatinib was 10/04/22 and the half life of elimination is 28 hrs. From a rhythm control standpoint the plan was to start amiodarone on Friday 03/01 to allow for washout.  He was started on cardizem in the ER and his rate is now in the 70s to 842s He is asymptomatic at rest. We spoke about different management strategies. There is a chance that he may switch back into sinus rhythm on his own overnight. He has been compliant with anticoagulation w/o interruption and another option would be DCCV which would likely happen on Monday. IV amiodarone with frequent ECG monitoring is also an option but given half life of Lenvatinib and potential drug-drug interaction/Qtc prolongation would favor staying with the plan to start next Friday. Matthew get EP input in am, for now given that he is asymptomatic and rate controlled we Matthew keep current  management.  Non valvular atrial fibrillation on anticoagulation with eliquis Non MI elevated troponins (in the setting of afib with RVR) PVD Type 2 DM CKD with baseline Cr of 1.7-1.9 HCC   Risk Assessment/Risk Scores:         CHA2DS2-VASc Score = 4   This indicates a 4.8% annual risk of stroke. The patient's score is based upon: CHF History: 0 HTN History: 1 Diabetes History: 1 Stroke History: 0 Vascular Disease History: 1 Age Score: 1 Gender Score:  0      Severity of Illness: The appropriate patient status for this patient is INPATIENT. Inpatient status is judged to be reasonable and necessary in order to provide the required intensity of service to ensure the patient's safety. The patient's presenting symptoms, physical exam findings, and initial radiographic and laboratory data in the context of their chronic comorbidities is felt to place them at high risk for further clinical deterioration. Furthermore, it is not anticipated that the patient Matthew be medically stable for discharge from the hospital within 2 midnights of admission.   * I certify that at the point of admission it is my clinical judgment that the patient Matthew require inpatient hospital care spanning beyond 2 midnights from the point of admission due to high intensity of service, high risk for further deterioration and high frequency of surveillance required.*   For questions or updates, please contact [place] Please consult [email or web address] for contact info under     Signed, Ewing Schlein, MD  10/05/2022 8:09 PM

## 2022-10-05 NOTE — ED Notes (Signed)
Patient back from CT.

## 2022-10-05 NOTE — ED Provider Triage Note (Signed)
Emergency Medicine Provider Triage Evaluation Note  Matthew Chen , a 67 y.o. male  was evaluated in triage.  Pt complains of shortness of breath for the past 12 hours.  Patient is currently in A-fib and on Eliquis who was recently hospitalized last week.  Patient states shortness of breath is constant.  Patient states he also feels fatigued.  Patient has history of hepatocellular carcinoma as well.  Patient denied chest pain, hemoptysis, leg swelling, abdominal pain, nausea/vomiting, syncope, productive cough  Review of Systems  Positive: See HPI Negative: See HPI  Physical Exam  Pulse (!) 116   Temp 98.3 F (36.8 C) (Oral)   Resp 20   SpO2 96%  Gen:   Awake, no distress   Resp:  Normal effort no sounds auscultated MSK:   Moves extremities without difficulty  Other:  Heart rate was irregular and tachycardic, 2+ bilateral pulses that were irregular with increased rate, 5-5 bilateral grip strength, sensation intact in both hands  Medical Decision Making  Medically screening exam initiated at 5:18 PM.  Appropriate orders placed.  Matthew Chen was informed that the remainder of the evaluation will be completed by another provider, this initial triage assessment does not replace that evaluation, and the importance of remaining in the ED until their evaluation is complete.  Workup initiated, patient most likely require fluids for getting CTA lungs, patient is currently tachycardic at 116 but not in distress.  Patient will be going to room [place], Jamey T, [place] 10/05/22 1721

## 2022-10-05 NOTE — ED Notes (Signed)
Patient provided a sandwich bag and drink

## 2022-10-06 DIAGNOSIS — I4819 Other persistent atrial fibrillation: Secondary | ICD-10-CM | POA: Diagnosis present

## 2022-10-06 DIAGNOSIS — Z800 Family history of malignant neoplasm of digestive organs: Secondary | ICD-10-CM | POA: Diagnosis not present

## 2022-10-06 DIAGNOSIS — Z83719 Family history of colon polyps, unspecified: Secondary | ICD-10-CM | POA: Diagnosis not present

## 2022-10-06 DIAGNOSIS — I4891 Unspecified atrial fibrillation: Secondary | ICD-10-CM | POA: Diagnosis present

## 2022-10-06 DIAGNOSIS — Z833 Family history of diabetes mellitus: Secondary | ICD-10-CM | POA: Diagnosis not present

## 2022-10-06 DIAGNOSIS — N183 Chronic kidney disease, stage 3 unspecified: Secondary | ICD-10-CM | POA: Diagnosis present

## 2022-10-06 DIAGNOSIS — K5190 Ulcerative colitis, unspecified, without complications: Secondary | ICD-10-CM | POA: Diagnosis present

## 2022-10-06 DIAGNOSIS — Z8249 Family history of ischemic heart disease and other diseases of the circulatory system: Secondary | ICD-10-CM | POA: Diagnosis not present

## 2022-10-06 DIAGNOSIS — Z7985 Long-term (current) use of injectable non-insulin antidiabetic drugs: Secondary | ICD-10-CM | POA: Diagnosis not present

## 2022-10-06 DIAGNOSIS — Z7901 Long term (current) use of anticoagulants: Secondary | ICD-10-CM | POA: Diagnosis not present

## 2022-10-06 DIAGNOSIS — Z794 Long term (current) use of insulin: Secondary | ICD-10-CM | POA: Diagnosis not present

## 2022-10-06 DIAGNOSIS — D6869 Other thrombophilia: Secondary | ICD-10-CM

## 2022-10-06 DIAGNOSIS — Z79899 Other long term (current) drug therapy: Secondary | ICD-10-CM | POA: Diagnosis not present

## 2022-10-06 DIAGNOSIS — E1151 Type 2 diabetes mellitus with diabetic peripheral angiopathy without gangrene: Secondary | ICD-10-CM | POA: Diagnosis present

## 2022-10-06 DIAGNOSIS — I739 Peripheral vascular disease, unspecified: Secondary | ICD-10-CM | POA: Diagnosis not present

## 2022-10-06 DIAGNOSIS — I7121 Aneurysm of the ascending aorta, without rupture: Secondary | ICD-10-CM | POA: Diagnosis present

## 2022-10-06 DIAGNOSIS — I130 Hypertensive heart and chronic kidney disease with heart failure and stage 1 through stage 4 chronic kidney disease, or unspecified chronic kidney disease: Secondary | ICD-10-CM | POA: Diagnosis present

## 2022-10-06 DIAGNOSIS — Z86010 Personal history of colonic polyps: Secondary | ICD-10-CM | POA: Diagnosis not present

## 2022-10-06 DIAGNOSIS — C220 Liver cell carcinoma: Secondary | ICD-10-CM | POA: Diagnosis present

## 2022-10-06 DIAGNOSIS — I480 Paroxysmal atrial fibrillation: Secondary | ICD-10-CM | POA: Diagnosis not present

## 2022-10-06 DIAGNOSIS — Z7982 Long term (current) use of aspirin: Secondary | ICD-10-CM | POA: Diagnosis not present

## 2022-10-06 DIAGNOSIS — E785 Hyperlipidemia, unspecified: Secondary | ICD-10-CM | POA: Diagnosis present

## 2022-10-06 DIAGNOSIS — Z79620 Long term (current) use of immunosuppressive biologic: Secondary | ICD-10-CM | POA: Diagnosis not present

## 2022-10-06 DIAGNOSIS — I2510 Atherosclerotic heart disease of native coronary artery without angina pectoris: Secondary | ICD-10-CM | POA: Diagnosis present

## 2022-10-06 DIAGNOSIS — T50995A Adverse effect of other drugs, medicaments and biological substances, initial encounter: Secondary | ICD-10-CM | POA: Diagnosis present

## 2022-10-06 DIAGNOSIS — Z7984 Long term (current) use of oral hypoglycemic drugs: Secondary | ICD-10-CM | POA: Diagnosis not present

## 2022-10-06 DIAGNOSIS — E1122 Type 2 diabetes mellitus with diabetic chronic kidney disease: Secondary | ICD-10-CM | POA: Diagnosis present

## 2022-10-06 LAB — CBC
HCT: 41.1 % (ref 39.0–52.0)
Hemoglobin: 12.6 g/dL — ABNORMAL LOW (ref 13.0–17.0)
MCH: 25.2 pg — ABNORMAL LOW (ref 26.0–34.0)
MCHC: 30.7 g/dL (ref 30.0–36.0)
MCV: 82.2 fL (ref 80.0–100.0)
Platelets: 145 10*3/uL — ABNORMAL LOW (ref 150–400)
RBC: 5 MIL/uL (ref 4.22–5.81)
RDW: 22.3 % — ABNORMAL HIGH (ref 11.5–15.5)
WBC: 5 10*3/uL (ref 4.0–10.5)
nRBC: 0 % (ref 0.0–0.2)

## 2022-10-06 LAB — BASIC METABOLIC PANEL
Anion gap: 6 (ref 5–15)
BUN: 24 mg/dL — ABNORMAL HIGH (ref 8–23)
CO2: 23 mmol/L (ref 22–32)
Calcium: 8.3 mg/dL — ABNORMAL LOW (ref 8.9–10.3)
Chloride: 109 mmol/L (ref 98–111)
Creatinine, Ser: 1.63 mg/dL — ABNORMAL HIGH (ref 0.61–1.24)
GFR, Estimated: 46 mL/min — ABNORMAL LOW (ref >60)
Glucose, Bld: 126 mg/dL — ABNORMAL HIGH (ref 70–99)
Potassium: 3.5 mmol/L (ref 3.5–5.1)
Sodium: 138 mmol/L (ref 135–145)

## 2022-10-06 LAB — GLUCOSE, CAPILLARY
Glucose-Capillary: 169 mg/dL — ABNORMAL HIGH (ref 70–99)
Glucose-Capillary: 108 mg/dL — ABNORMAL HIGH (ref 70–99)
Glucose-Capillary: 154 mg/dL — ABNORMAL HIGH (ref 70–99)
Glucose-Capillary: 175 mg/dL — ABNORMAL HIGH (ref 70–99)

## 2022-10-06 LAB — HEPATIC FUNCTION PANEL
ALT: 15 U/L (ref 0–44)
AST: 24 U/L (ref 15–41)
Albumin: 2.4 g/dL — ABNORMAL LOW (ref 3.5–5.0)
Alkaline Phosphatase: 56 U/L (ref 38–126)
Bilirubin, Direct: 0.2 mg/dL (ref 0.0–0.2)
Indirect Bilirubin: 0.7 mg/dL (ref 0.3–0.9)
Total Bilirubin: 0.9 mg/dL (ref 0.3–1.2)
Total Protein: 5.4 g/dL — ABNORMAL LOW (ref 6.5–8.1)

## 2022-10-06 NOTE — Progress Notes (Signed)
Rounding Note    Patient Name: Matthew Chen Date of Encounter: 10/06/2022  [place] Cardiologist: Quay Burow, MD   Subjective   Remains in Afib but HR better controlled on dilt gtt No chest pain, SOB, orthopnea or PND  Inpatient Medications    Scheduled Meds:  apixaban  5 mg Oral BID   atorvastatin  80 mg Oral Daily   carvedilol  12.5 mg Oral BID WC   empagliflozin  10 mg Oral Daily   insulin aspart  0-9 Units Subcutaneous TID WC   lisinopril  20 mg Oral Daily   Continuous Infusions:  diltiazem (CARDIZEM) infusion 5 mg/hr (10/06/22 0429)   PRN Meds: acetaminophen, ondansetron (ZOFRAN) IV   Vital Signs    Vitals:   10/05/22 2130 10/05/22 2238 10/06/22 0421 10/06/22 0742  BP: 137/89 120/79 116/77 112/61  Pulse: (!) 103 88 69   Resp: '15 16 17 18  '$ Temp:  97.8 F (36.6 C) 97.8 F (36.6 C) 98.1 F (36.7 C)  TempSrc:  Oral Oral Oral  SpO2: 95% 96% 97%   Weight:  96.3 kg    Height:  '5\' 10"'$  (1.778 m)      Intake/Output Summary (Last 24 hours) at 10/06/2022 0916 Last data filed at 10/06/2022 0500 Gross per 24 hour  Intake 591.5 ml  Output --  Net 591.5 ml      10/05/2022   10:38 PM 10/04/2022    9:18 AM 10/02/2022   11:17 AM  Last 3 Weights  Weight (lbs) 212 lb 6.4 oz 210 lb 6.4 oz 213 lb  Weight (kg) 96.344 kg 95.437 kg 96.616 kg      Telemetry    Afib; rate controlled 60-90s - Personally Reviewed  ECG    Afib with HR 99 - Personally Reviewed  Physical Exam   GEN: No acute distress.   Neck: No JVD Cardiac: Irregular, 2/6 systolic murmur Respiratory: Clear to auscultation bilaterally. GI: Soft, nontender, non-distended  MS: No edema; No deformity. Neuro:  Nonfocal  Psych: Normal affect   Labs    High Sensitivity Troponin:   Recent Labs  Lab 10/05/22 1715 10/05/22 1920  TROPONINIHS 25* 27*     Chemistry Recent Labs  Lab 10/05/22 1715 10/06/22 0210  NA 139 138  K 4.3 3.5  CL 107 109  CO2 21* 23  GLUCOSE 135* 126*   BUN 25* 24*  CREATININE 1.76* 1.63*  CALCIUM 9.0 8.3*  PROT  --  5.4*  ALBUMIN  --  2.4*  AST  --  24  ALT  --  15  ALKPHOS  --  56  BILITOT  --  0.9  GFRNONAA 42* 46*  ANIONGAP 11 6    Lipids No results for input(s): "CHOL", "TRIG", "HDL", "LABVLDL", "LDLCALC", "CHOLHDL" in the last 168 hours.  Hematology Recent Labs  Lab 10/05/22 1715 10/06/22 0210  WBC 6.1 5.0  RBC 5.62 5.00  HGB 14.3 12.6*  HCT 46.6 41.1  MCV 82.9 82.2  MCH 25.4* 25.2*  MCHC 30.7 30.7  RDW 23.0* 22.3*  PLT 180 145*   Thyroid No results for input(s): "TSH", "FREET4" in the last 168 hours.  BNP Recent Labs  Lab 10/05/22 1800  BNP 1,007.5*    DDimer No results for input(s): "DDIMER" in the last 168 hours.   Radiology    CT Angio Chest PE W/Cm &/Or Wo Cm  Result Date: 10/05/2022 CLINICAL DATA:  Dyspnea, atrial fibrillation EXAM: CT ANGIOGRAPHY CHEST WITH CONTRAST TECHNIQUE: Multidetector  CT imaging of the chest was performed using the standard protocol during bolus administration of intravenous contrast. Multiplanar CT image reconstructions and MIPs were obtained to evaluate the vascular anatomy. RADIATION DOSE REDUCTION: This exam was performed according to the departmental dose-optimization program which includes automated exposure control, adjustment of the mA and/or kV according to patient size and/or use of iterative reconstruction technique. CONTRAST:  67m OMNIPAQUE IOHEXOL 350 MG/ML SOLN COMPARISON:  01/30/2021 FINDINGS: Cardiovascular: Adequate opacification of the pulmonary arterial tree. No intraluminal filling defect identified to suggest acute pulmonary embolism. Central pulmonary arteries are enlarged in keeping with changes of pulmonary arterial hypertension. Extensive multi-vessel coronary artery calcification. Calcification of the aortic valve leaflets noted. Cardiac size within normal limits. No pericardial effusion. Mild atherosclerotic calcification within the thoracic aorta. No aortic  aneurysm. Mediastinum/Nodes: No enlarged mediastinal, hilar, or axillary lymph nodes. Thyroid gland, trachea, and esophagus demonstrate no significant findings. Lungs/Pleura: Lungs are clear. No pneumothorax or pleural effusion. Bronchial wall thickening is present in keeping with airway inflammation. No central obstructing lesion. Upper Abdomen: Cholelithiasis without pericholecystic inflammatory change. No acute abnormality within the visualized upper abdomen. Musculoskeletal: Osseous structures are age-appropriate. No acute bone abnormality. No lytic or blastic bone lesion. Review of the MIP images confirms the above findings. IMPRESSION: 1. No pulmonary embolism. 2. Extensive multi-vessel coronary artery calcification. 3. Morphologic changes in keeping with pulmonary arterial hypertension. 4. Calcification of the aortic valve leaflets. Echocardiography may be helpful to assess the degree of valvular dysfunction. 5. Bronchial wall thickening in keeping with airway inflammation. No central obstructing lesion. 6. Cholelithiasis. Aortic Atherosclerosis (ICD10-I70.0). Electronically Signed   By: AFidela SalisburyM.D.   On: 10/05/2022 20:06   DG Chest 2 View  Result Date: 10/05/2022 CLINICAL DATA:  Shortness of breath and fatigue. EXAM: CHEST - 2 VIEW COMPARISON:  March 27, 2022 FINDINGS: The heart size and mediastinal contours are within normal limits. Both lungs are clear. The visualized skeletal structures are unremarkable. IMPRESSION: No active cardiopulmonary disease. Electronically Signed   By: TVirgina NorfolkM.D.   On: 10/05/2022 18:12    Cardiac Studies   TTE 03/2022: IMPRESSIONS     1. Left ventricular ejection fraction, by estimation, is 65 to 70%. Left  ventricular ejection fraction by 3D volume is 69 %. The left ventricle has  normal function. The left ventricle has no regional wall motion  abnormalities. Left ventricular diastolic   parameters are indeterminate. Elevated left atrial  pressure. The average  left ventricular global longitudinal strain is -22.1 %. The global  longitudinal strain is normal.   2. Right ventricular systolic function is normal. The right ventricular  size is normal. There is normal pulmonary artery systolic pressure. The  estimated right ventricular systolic pressure is [id number].   3. The mitral valve is grossly normal. Mild mitral valve regurgitation.   4. The aortic valve is calcified. Aortic valve regurgitation is not  visualized. Aortic valve mean gradient measures 11.0 mmHg.   5. Aortic dilatation noted. There is mild dilatation of the aortic root,  measuring 41 mm.   Patient Profile     67y.o. male PAF, HTN, DM, CAD, CKD, PVD, and hepatocellular carcinoma who presented with Afib with RVR.    Assessment & Plan    #Afib with RVR with CHADs-vasc 4: -Patient presented with worsening fatigue and SOB found to be in Afib with RVR -Previous admission for tikosyn load but developed prolonged Qtc and medication was stopped; thought to be due to drug  interaction with Lenvatinib -Was planned to start amiodarone on 03/01 after Lenvatinib washout but developed recurrent Afib with RVR as above -Currently on dilt gtt with improvement in rates; will continue today -On apixaban '5mg'$  BID without missed doses -Will plan for DCCV this week if remains in Afib per patient preference as very symptomatic with his Afib  #CAD: Cath 01/2021 with high grade ostial D1 disease. Returned 02/2021 for intervention but was complicated by coronary perforation. Fortunately, no tamponade. Has been managed medically. 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 Follows with [name]. -Not on ASA due  to need for [facility] -Continue lipitor '80mg'$  daily  #HTN: -Continue coreg 12.'5mg'$  BID -Continue lisinopril '10mg'$  daily  #HLD: -Continue lipitor '80mg'$  daily  #Ascending Aortic Aneurysm: -Continue annual monitoring  #Hepatocellular Carcinoma: -Follows with Onc      For questions or updates, please contact [place] Please consult [email or web address] for contact info under        Signed, Freada Bergeron, MD  10/06/2022, 9:16 AM

## 2022-10-06 NOTE — Progress Notes (Incomplete)
Rounding Note    Patient Name: Matthew Chen Date of Encounter: 10/06/2022  [place] Cardiologist: Quay Burow, MD   Subjective   ***  Inpatient Medications    Scheduled Meds:  apixaban  5 mg Oral BID   atorvastatin  80 mg Oral Daily   carvedilol  12.5 mg Oral BID WC   empagliflozin  10 mg Oral Daily   insulin aspart  0-9 Units Subcutaneous TID WC   lisinopril  20 mg Oral Daily   Continuous Infusions:  diltiazem (CARDIZEM) infusion 5 mg/hr (10/06/22 1826)   PRN Meds: acetaminophen, ondansetron (ZOFRAN) IV   Vital Signs    Vitals:   10/06/22 0421 10/06/22 0742 10/06/22 1019 10/06/22 1707  BP: 116/77 112/61 119/81 106/75  Pulse: 69  80 79  Resp: '17 18  18  '$ Temp: 97.8 F (36.6 C) 98.1 F (36.7 C) (!) 97.5 F (36.4 C) 97.8 F (36.6 C)  TempSrc: Oral Oral Oral Oral  SpO2: 97%  97%   Weight:      Height:        Intake/Output Summary (Last 24 hours) at 10/06/2022 1936 Last data filed at 10/06/2022 1826 Gross per 24 hour  Intake 1378.67 ml  Output --  Net 1378.67 ml       10/05/2022   10:38 PM 10/04/2022    9:18 AM 10/02/2022   11:17 AM  Last 3 Weights  Weight (lbs) 212 lb 6.4 oz 210 lb 6.4 oz 213 lb  Weight (kg) 96.344 kg 95.437 kg 96.616 kg      Telemetry    Afib; rate controlled 60-90s - Personally Reviewed  ECG    Afib with HR 99 - Personally Reviewed  Physical Exam   GEN: No acute distress.   Neck: No JVD Cardiac: Irregular, 2/6 systolic murmur Respiratory: Clear to auscultation bilaterally. GI: Soft, nontender, non-distended  MS: No edema; No deformity. Neuro:  Nonfocal  Psych: Normal affect   Labs    High Sensitivity Troponin:   Recent Labs  Lab 10/05/22 1715 10/05/22 1920  TROPONINIHS 25* 27*      Chemistry Recent Labs  Lab 10/05/22 1715 10/06/22 0210  NA 139 138  K 4.3 3.5  CL 107 109  CO2 21* 23  GLUCOSE 135* 126*  BUN 25* 24*  CREATININE 1.76* 1.63*  CALCIUM 9.0 8.3*  PROT  --  5.4*  ALBUMIN   --  2.4*  AST  --  24  ALT  --  15  ALKPHOS  --  56  BILITOT  --  0.9  GFRNONAA 42* 46*  ANIONGAP 11 6     Lipids No results for input(s): "CHOL", "TRIG", "HDL", "LABVLDL", "LDLCALC", "CHOLHDL" in the last 168 hours.  Hematology Recent Labs  Lab 10/05/22 1715 10/06/22 0210  WBC 6.1 5.0  RBC 5.62 5.00  HGB 14.3 12.6*  HCT 46.6 41.1  MCV 82.9 82.2  MCH 25.4* 25.2*  MCHC 30.7 30.7  RDW 23.0* 22.3*  PLT 180 145*    Thyroid No results for input(s): "TSH", "FREET4" in the last 168 hours.  BNP Recent Labs  Lab 10/05/22 1800  BNP 1,007.5*     DDimer No results for input(s): "DDIMER" in the last 168 hours.   Radiology    CT Angio Chest PE W/Cm &/Or Wo Cm  Result Date: 10/05/2022 CLINICAL DATA:  Dyspnea, atrial fibrillation EXAM: CT ANGIOGRAPHY CHEST WITH CONTRAST TECHNIQUE: Multidetector CT imaging of the chest was performed using the standard protocol during  bolus administration of intravenous contrast. Multiplanar CT image reconstructions and MIPs were obtained to evaluate the vascular anatomy. RADIATION DOSE REDUCTION: This exam was performed according to the departmental dose-optimization program which includes automated exposure control, adjustment of the mA and/or kV according to patient size and/or use of iterative reconstruction technique. CONTRAST:  88m OMNIPAQUE IOHEXOL 350 MG/ML SOLN COMPARISON:  01/30/2021 FINDINGS: Cardiovascular: Adequate opacification of the pulmonary arterial tree. No intraluminal filling defect identified to suggest acute pulmonary embolism. Central pulmonary arteries are enlarged in keeping with changes of pulmonary arterial hypertension. Extensive multi-vessel coronary artery calcification. Calcification of the aortic valve leaflets noted. Cardiac size within normal limits. No pericardial effusion. Mild atherosclerotic calcification within the thoracic aorta. No aortic aneurysm. Mediastinum/Nodes: No enlarged mediastinal, hilar, or axillary lymph  nodes. Thyroid gland, trachea, and esophagus demonstrate no significant findings. Lungs/Pleura: Lungs are clear. No pneumothorax or pleural effusion. Bronchial wall thickening is present in keeping with airway inflammation. No central obstructing lesion. Upper Abdomen: Cholelithiasis without pericholecystic inflammatory change. No acute abnormality within the visualized upper abdomen. Musculoskeletal: Osseous structures are age-appropriate. No acute bone abnormality. No lytic or blastic bone lesion. Review of the MIP images confirms the above findings. IMPRESSION: 1. No pulmonary embolism. 2. Extensive multi-vessel coronary artery calcification. 3. Morphologic changes in keeping with pulmonary arterial hypertension. 4. Calcification of the aortic valve leaflets. Echocardiography may be helpful to assess the degree of valvular dysfunction. 5. Bronchial wall thickening in keeping with airway inflammation. No central obstructing lesion. 6. Cholelithiasis. Aortic Atherosclerosis (ICD10-I70.0). Electronically Signed   By: AFidela SalisburyM.D.   On: 10/05/2022 20:06   DG Chest 2 View  Result Date: 10/05/2022 CLINICAL DATA:  Shortness of breath and fatigue. EXAM: CHEST - 2 VIEW COMPARISON:  March 27, 2022 FINDINGS: The heart size and mediastinal contours are within normal limits. Both lungs are clear. The visualized skeletal structures are unremarkable. IMPRESSION: No active cardiopulmonary disease. Electronically Signed   By: TVirgina NorfolkM.D.   On: 10/05/2022 18:12    Cardiac Studies   TTE 03/2022: IMPRESSIONS     1. Left ventricular ejection fraction, by estimation, is 65 to 70%. Left  ventricular ejection fraction by 3D volume is 69 %. The left ventricle has  normal function. The left ventricle has no regional wall motion  abnormalities. Left ventricular diastolic   parameters are indeterminate. Elevated left atrial pressure. The average  left ventricular global longitudinal strain is -22.1 %. The  global  longitudinal strain is normal.   2. Right ventricular systolic function is normal. The right ventricular  size is normal. There is normal pulmonary artery systolic pressure. The  estimated right ventricular systolic pressure is [id number].   3. The mitral valve is grossly normal. Mild mitral valve regurgitation.   4. The aortic valve is calcified. Aortic valve regurgitation is not  visualized. Aortic valve mean gradient measures 11.0 mmHg.   5. Aortic dilatation noted. There is mild dilatation of the aortic root,  measuring 41 mm.   Patient Profile     67y.o. male PAF, HTN, DM, CAD, CKD, PVD, and hepatocellular carcinoma who presented with Afib with RVR.    Assessment & Plan    #Afib with RVR with CHADs-vasc 4: -Patient presented with worsening fatigue and SOB found to be in Afib with RVR -Previous admission for tikosyn load but developed prolonged Qtc and medication was stopped; thought to be due to drug interaction with Lenvatinib -Was planned to start amiodarone on 03/01 after Lenvatinib  washout but developed recurrent Afib with RVR as above -Currently on dilt gtt with improvement in rates; will continue today -On apixaban '5mg'$  BID without missed doses -Will plan for DCCV this week if remains in Afib per patient preference as very symptomatic with his Afib  #CAD: Cath 01/2021 with high grade ostial D1 disease. Returned 02/2021 for intervention but was complicated by coronary perforation. Fortunately, no tamponade. Has been managed medically. -Not on ASA due to need for [facility] -Continue lipitor '80mg'$  daily  #PAD: History of orbital rotational arthrectomy and drug eluting balloon angioplasty of distal left SFA in November 2016 and another orbital rotational arthrectomy and balloon angioplasty for high-grade subocclusive right popliteal plaque in December 2016. Follows with [name]. -Not on ASA due to need for [facility] -Continue lipitor '80mg'$  daily  #HTN: -Continue coreg 12.'5mg'$   BID -Continue lisinopril '10mg'$  daily  #HLD: -Continue lipitor '80mg'$  daily  #Ascending Aortic Aneurysm: -Continue annual monitoring  #Hepatocellular Carcinoma: -Follows with Onc      For questions or updates, please contact [place] Please consult [email or web address] for contact info under        Signed, Freada Bergeron, MD  10/06/2022, 7:36 PM

## 2022-10-07 ENCOUNTER — Other Ambulatory Visit: Payer: Self-pay

## 2022-10-07 ENCOUNTER — Telehealth: Payer: Self-pay

## 2022-10-07 ENCOUNTER — Telehealth: Payer: Self-pay | Admitting: Pharmacist

## 2022-10-07 ENCOUNTER — Other Ambulatory Visit: Payer: Self-pay | Admitting: Pharmacist

## 2022-10-07 ENCOUNTER — Other Ambulatory Visit (HOSPITAL_COMMUNITY): Payer: Self-pay

## 2022-10-07 DIAGNOSIS — C220 Liver cell carcinoma: Secondary | ICD-10-CM | POA: Diagnosis not present

## 2022-10-07 DIAGNOSIS — I480 Paroxysmal atrial fibrillation: Secondary | ICD-10-CM

## 2022-10-07 DIAGNOSIS — I2510 Atherosclerotic heart disease of native coronary artery without angina pectoris: Secondary | ICD-10-CM | POA: Diagnosis not present

## 2022-10-07 DIAGNOSIS — D6869 Other thrombophilia: Secondary | ICD-10-CM | POA: Diagnosis not present

## 2022-10-07 LAB — GLUCOSE, CAPILLARY: Glucose-Capillary: 161 mg/dL — ABNORMAL HIGH (ref 70–99)

## 2022-10-07 MED ORDER — EMPAGLIFLOZIN 10 MG PO TABS: 10.0000 mg | ORAL_TABLET | Freq: Every day | ORAL | 3 refills | Status: DC

## 2022-10-07 MED ORDER — DILTIAZEM HCL 30 MG PO TABS: 30.0000 mg | ORAL_TABLET | Freq: Three times a day (TID) | ORAL | 11 refills | Status: DC | PRN

## 2022-10-07 MED ORDER — CARVEDILOL 12.5 MG PO TABS: 12.5000 mg | ORAL_TABLET | Freq: Two times a day (BID) | ORAL | 2 refills | Status: DC

## 2022-10-07 MED ORDER — CABOZANTINIB S-MALATE 60 MG PO TABS
60.0000 mg | ORAL_TABLET | Freq: Every day | ORAL | 0 refills | Status: DC
  Filled 2022-10-07 (×2): qty 30, 30d supply, fill #0

## 2022-10-07 NOTE — Discharge Summary (Addendum)
Discharge Summary    Patient ID: Matthew Chen MRN: [id number]; DOB: Dec 29, 1955  Admit date: 10/05/2022 Discharge date: 10/07/2022  PCP:  Susy Frizzle, MD   [place] [facility] Cardiologist:  Quay Burow, MD  Electrophysiologist:  Constance Haw, MD  {  Discharge Diagnoses    Principal Problem:   Atrial fibrillation [facility]) Active Problems:   Type 2 diabetes mellitus with complication, with long-term current use of insulin ([place])   CKD (chronic kidney disease), stage III ([place])   Aortic aneurysm, thoracic   CAD (coronary artery disease)   Atherosclerotic heart disease of native coronary artery without angina pectoris   Hepatocellular carcinoma ([place])   Hypercoagulable state due to persistent atrial fibrillation [facility])   Atrial fibrillation with rapid ventricular response ([place])   Diagnostic Studies/Procedures    Echo 03/2022  1. Left ventricular ejection fraction, by estimation, is 65 to 70%. Left  ventricular ejection fraction by 3D volume is 69 %. The left ventricle has  normal function. The left ventricle has no regional wall motion  abnormalities. Left ventricular diastolic   parameters are indeterminate. Elevated left atrial pressure. The average  left ventricular global longitudinal strain is -22.1 %. The global  longitudinal strain is normal.   2. Right ventricular systolic function is normal. The right ventricular  size is normal. There is normal pulmonary artery systolic pressure. The  estimated right ventricular systolic pressure is Q000111Q mmHg.   3. The mitral valve is grossly normal. Mild mitral valve regurgitation.   4. The aortic valve is calcified. Aortic valve regurgitation is not  visualized. Aortic valve mean gradient measures 11.0 mmHg.   5. Aortic dilatation noted. There is mild dilatation of the aortic root,  measuring 41 mm.   _____________   History of Present Illness     Matthew Chen is a 67 y.o. male with PAF, HTN, DM, CAD,  CKD, PVD, and hepatocellular carcinoma who presented with Afib with RVR.     [name] presented to the ER complaining of fatigue and shortness of breath. This correlates with the onset of atrial fibrillation. Recently, he was admitted on 02/13 for Tikosyn load but this resulted in significant Qtc prolongation which was felt was partly due to concomitant lenvatinib use. He visited the EP office yesterday and his Qtc had normalized (reported to be 460) while in sinus brady (HR 53 bpm). I do not have this ECG available for review. He has been compliant with his anticoagulation without interruption.  On arrival, HR of 138 bpm, BP 126/83 mmHg, RR 15.  The onset was today during the morning and it was described as fatigue, lightheadedness and shortness of breath. These are his typical afib symptoms. There was a recent attempt for rhythm control with Tykosin which was stopped due to Qtc prolongation. Some of it it was thought to be related to drug-drug interaction with Lenvatinib. Discussion was held with Oncologist and the plan was to switch him to Cabozatinib on 10/07/22. His last dose of Lenvatinib was 10/04/22 and the half life of elimination is 28 hrs. From a rhythm control standpoint the plan was to start amiodarone on Friday 03/01 to allow for washout.   He was started on cardizem in the ER and his rate is now in the 70s to 75s. He is asymptomatic at rest. We spoke about different management strategies. There is a chance that he may switch back into sinus rhythm on his own overnight. He has been compliant with anticoagulation  w/o interruption and another option would be DCCV which would likely happen on Monday. IV amiodarone with frequent ECG monitoring is also an option but given half life of Lenvatinib and potential drug-drug interaction/Qtc prolongation would favor staying with the plan to start next Friday.   Hospital Course     Consultants: none  Afib with RVR Presenting symptoms include  worsening fatigue and shortness of breath.  As above, he failed Tikosyn due to prolonged QTc thought to be an interaction with lenvatinib.  Through guidance from EP, plan was to start amiodarone p.o. load on 10/11/2022 after lenvatinib washout. -Unfortunately he presented with A-fib RVR with rate control using Cardizem drip running at 5 mg/h.  Initial plan was for cardioversion later this week but the patient converted to sinus bradycardia in the 50s earlier this morning.  Telemetry currently with sinus rhythm heart rates in the 60s-70s.  He has received 12.5 mg Coreg this morning.  Will discharge on 12.5 mg Coreg twice daily and 30 mg p.o. Cardizem 3 times daily to be used as needed for palpitations.  He did have a significantly elevated BNP on arrival, but appears euvolemic on exam today.  Prior echocardiogram 03/2022 reviewed and showed normal LVEF.   Chronic anticoagulation Has been maintained on 5 mg Eliquis twice daily with no bleeding issues.   CAD Heart cath 01/2021 with 99% ostial D1 felt to be the culprit lesion but would need orbital atherectomy and DES. In the setting of CKD, PCI was deferred in favor of trial of medical therapy. He was brought back to the lab 02/15/21 for D1 intervention, unfortunately unsuccessful due to vessel perforation treated with protamine and balloon tamponade, no subsequent pericardial effusion.  - no chest pain   Hypertension Maintained on Coreg 37.5 mg twice daily and 10 mg lisinopril. Now on 12.5 mg Coreg twice daily Will need to monitor BP outpatient and titrate as needed being mindful of heart rate.   Dilated aortic root 41 mm on echo 03/2022 Will need to focus on blood pressure control      Did the patient have an acute coronary syndrome (MI, NSTEMI, STEMI, etc) this admission?:  No                               Did the patient have a percutaneous coronary intervention (stent / angioplasty)?:  No.        The patient will be scheduled for a TOC  follow up appointment in 7-10 days.  A message has been sent to the [facility] and Scheduling Pool at the office where the patient should be seen for follow up.  _____________  Discharge Vitals Blood pressure 120/67, pulse 82, temperature 98.5 F (36.9 C), temperature source Oral, resp. rate 18, height '5\' 10"'$  (1.778 m), weight 96.3 kg, SpO2 96 %.  Filed Weights   10/05/22 2238  Weight: 96.3 kg    Labs & Radiologic Studies    CBC Recent Labs    10/05/22 1715 10/06/22 0210  WBC 6.1 5.0  NEUTROABS 3.9  --   HGB 14.3 12.6*  HCT 46.6 41.1  MCV 82.9 82.2  PLT 180 Q000111Q*   Basic Metabolic Panel Recent Labs    10/05/22 1715 10/06/22 0210  NA 139 138  K 4.3 3.5  CL 107 109  CO2 21* 23  GLUCOSE 135* 126*  BUN 25* 24*  CREATININE 1.76* 1.63*  CALCIUM 9.0 8.3*   Liver  Function Tests Recent Labs    10/06/22 0210  AST 24  ALT 15  ALKPHOS 56  BILITOT 0.9  PROT 5.4*  ALBUMIN 2.4*   No results for input(s): "LIPASE", "AMYLASE" in the last 72 hours. High Sensitivity Troponin:   Recent Labs  Lab 10/05/22 1715 10/05/22 1920  TROPONINIHS 25* 27*    BNP Invalid input(s): "POCBNP" D-Dimer No results for input(s): "DDIMER" in the last 72 hours. Hemoglobin A1C No results for input(s): "HGBA1C" in the last 72 hours. Fasting Lipid Panel No results for input(s): "CHOL", "HDL", "LDLCALC", "TRIG", "CHOLHDL", "LDLDIRECT" in the last 72 hours. Thyroid Function Tests No results for input(s): "TSH", "T4TOTAL", "T3FREE", "THYROIDAB" in the last 72 hours.  Invalid input(s): "FREET3" _____________  CT Angio Chest PE W/Cm &/Or Wo Cm  Result Date: 10/05/2022 CLINICAL DATA:  Dyspnea, atrial fibrillation EXAM: CT ANGIOGRAPHY CHEST WITH CONTRAST TECHNIQUE: Multidetector CT imaging of the chest was performed using the standard protocol during bolus administration of intravenous contrast. Multiplanar CT image reconstructions and MIPs were obtained to evaluate the vascular anatomy. RADIATION  DOSE REDUCTION: This exam was performed according to the departmental dose-optimization program which includes automated exposure control, adjustment of the mA and/or kV according to patient size and/or use of iterative reconstruction technique. CONTRAST:  28m OMNIPAQUE IOHEXOL 350 MG/ML SOLN COMPARISON:  01/30/2021 FINDINGS: Cardiovascular: Adequate opacification of the pulmonary arterial tree. No intraluminal filling defect identified to suggest acute pulmonary embolism. Central pulmonary arteries are enlarged in keeping with changes of pulmonary arterial hypertension. Extensive multi-vessel coronary artery calcification. Calcification of the aortic valve leaflets noted. Cardiac size within normal limits. No pericardial effusion. Mild atherosclerotic calcification within the thoracic aorta. No aortic aneurysm. Mediastinum/Nodes: No enlarged mediastinal, hilar, or axillary lymph nodes. Thyroid gland, trachea, and esophagus demonstrate no significant findings. Lungs/Pleura: Lungs are clear. No pneumothorax or pleural effusion. Bronchial wall thickening is present in keeping with airway inflammation. No central obstructing lesion. Upper Abdomen: Cholelithiasis without pericholecystic inflammatory change. No acute abnormality within the visualized upper abdomen. Musculoskeletal: Osseous structures are age-appropriate. No acute bone abnormality. No lytic or blastic bone lesion. Review of the MIP images confirms the above findings. IMPRESSION: 1. No pulmonary embolism. 2. Extensive multi-vessel coronary artery calcification. 3. Morphologic changes in keeping with pulmonary arterial hypertension. 4. Calcification of the aortic valve leaflets. Echocardiography may be helpful to assess the degree of valvular dysfunction. 5. Bronchial wall thickening in keeping with airway inflammation. No central obstructing lesion. 6. Cholelithiasis. Aortic Atherosclerosis (ICD10-I70.0). Electronically Signed   By: AFidela SalisburyM.D.    On: 10/05/2022 20:06   DG Chest 2 View  Result Date: 10/05/2022 CLINICAL DATA:  Shortness of breath and fatigue. EXAM: CHEST - 2 VIEW COMPARISON:  March 27, 2022 FINDINGS: The heart size and mediastinal contours are within normal limits. Both lungs are clear. The visualized skeletal structures are unremarkable. IMPRESSION: No active cardiopulmonary disease. Electronically Signed   By: TVirgina NorfolkM.D.   On: 10/05/2022 18:12   Disposition   Pt is being discharged home today in good condition.  Follow-up Plans & Appointments     Follow-up Information     MLenna Sciara NP Follow up on 10/30/2022.   Specialties: Cardiology, Family Medicine Why: 10:55 am for hospital follow up Contact information: [address] [phone]               Discharge Instructions     Diet - low sodium heart healthy  Complete by: As directed    Diet - low sodium heart healthy   Complete by: As directed    Discharge instructions   Complete by: As directed    No driving for 2 days. No lifting over 5 lbs for 1 week. No sexual activity for 1 week. Keep procedure site clean & dry. If you notice increased pain, swelling, bleeding or pus, call/return!  You may shower, but no soaking baths/hot tubs/pools for 1 week.   Increase activity slowly   Complete by: As directed    Increase activity slowly   Complete by: As directed         Discharge Medications   Allergies as of 10/07/2022       Reactions   Penicillins Other (See Comments)   Unsure, childhood reaction Has patient had a PCN reaction causing immediate rash, facial/tongue/throat swelling, SOB or lightheadedness with hypotension: Unknown Has patient had a PCN reaction causing severe rash involving mucus membranes or skin necrosis: Unknown Has patient had a PCN reaction that required hospitalization: No Has patient had a PCN reaction occurring within the last 10 years: No If all of the above  answers are "NO", then may proceed with Cephalosporin use.        Medication List     Continue planned:   cabozantinib 60 MG tablet Commonly known as: CABOMETYX       TAKE these medications    amiodarone 200 MG tablet Commonly known as: PACERONE Take 1 tablet (200 mg total) by mouth 2 (two) times daily. Start taking on: October 11, 2022   apixaban 5 MG Tabs tablet Commonly known as: Eliquis Take 1 tablet (5 mg total) by mouth 2 (two) times daily.   aspirin EC 81 MG tablet Take 1 tablet (81 mg total) by mouth daily.   atorvastatin 80 MG tablet Commonly known as: LIPITOR TAKE 1 TABLET BY MOUTH EVERY DAY   B-D UF III MINI PEN NEEDLES 31G X 5 MM Misc Generic drug: Insulin Pen Needle Use daily with Victoza   carvedilol 12.5 MG tablet Commonly known as: COREG Take 1 tablet (12.5 mg total) by mouth 2 (two) times daily with a meal. What changed: See the new instructions.   cyanocobalamin 1000 MCG tablet Commonly known as: VITAMIN B12 Take 1,000 mcg by mouth daily.   diltiazem 30 MG tablet Commonly known as: Cardizem Take 1 tablet (30 mg total) by mouth 3 (three) times daily as needed (palpitations).   empagliflozin 10 MG Tabs tablet Commonly known as: JARDIANCE Take 1 tablet (10 mg total) by mouth daily. Start taking on: October 08, 2022 What changed:  medication strength how much to take when to take this   Entyvio 300 MG injection Generic drug: vedolizumab Inject 300 mg as directed See admin instructions. Every 2 months   fenofibrate 160 MG tablet Take 1 tablet (160 mg total) by mouth daily.   Fish Oil 1000 MG Caps Take 1,000 mg by mouth 2 (two) times daily.   HYDROcodone-acetaminophen 7.5-325 MG tablet Commonly known as: Norco Take 1 tablet by mouth every 6 (six) hours as needed for moderate pain.   lisinopril 20 MG tablet Commonly known as: ZESTRIL Take 1 tablet (20 mg total) by mouth daily.   OneTouch Ultra test strip Generic drug: glucose  blood CHECK BLOOD SUGAR TWICE A DAY   Pain Relief Extra Strength 500 MG tablet Generic drug: acetaminophen Take 1 tablet (500 mg total) by mouth every 6 (six) hours as needed.  pantoprazole 40 MG tablet Commonly known as: PROTONIX Take 1 tablet (40 mg total) by mouth 2 (two) times daily.   pioglitazone 30 MG tablet Commonly known as: ACTOS TAKE 1 TABLET BY MOUTH EVERY DAY. STRENGTH: 30 MG   sildenafil 100 MG tablet Commonly known as: Viagra Take 0.5-1 tablets (50-100 mg total) by mouth daily as needed for erectile dysfunction.   Trulicity 3 0000000 Sopn Generic drug: Dulaglutide Inject 3 mg as directed once a week.           Outstanding Labs/Studies   Follow BP  Duration of Discharge Encounter   Greater than 30 minutes including physician time.  Patient seen and examined and agree with Fabian Sharp, PA as detailed above. Please see progress note from today.   Signed, Freada Bergeron, MD 10/07/2022, 12:32 PM

## 2022-10-07 NOTE — Progress Notes (Addendum)
Rounding Note    Patient Name: Matthew Chen Date of Encounter: 10/07/2022  [place] Cardiologist: Quay Burow, MD   Subjective   Pt has no complaints, feels well and wishes to discharge home.  Inpatient Medications    Scheduled Meds:  apixaban  5 mg Oral BID   atorvastatin  80 mg Oral Daily   carvedilol  12.5 mg Oral BID WC   empagliflozin  10 mg Oral Daily   insulin aspart  0-9 Units Subcutaneous TID WC   lisinopril  20 mg Oral Daily   Continuous Infusions:  diltiazem (CARDIZEM) infusion 5 mg/hr (10/07/22 0710)   PRN Meds: acetaminophen, ondansetron (ZOFRAN) IV   Vital Signs    Vitals:   10/06/22 1019 10/06/22 1707 10/06/22 2006 10/07/22 0549  BP: 119/81 106/75 116/73 119/78  Pulse: 80 79 80 82  Resp:  '18 16 18  '$ Temp: (!) 97.5 F (36.4 C) 97.8 F (36.6 C) (!) 97.5 F (36.4 C) 98.5 F (36.9 C)  TempSrc: Oral Oral Oral Oral  SpO2: 97%  99% 96%  Weight:      Height:        Intake/Output Summary (Last 24 hours) at 10/07/2022 0721 Last data filed at 10/07/2022 0600 Gross per 24 hour  Intake 965 ml  Output --  Net 965 ml      10/05/2022   10:38 PM 10/04/2022    9:18 AM 10/02/2022   11:17 AM  Last 3 Weights  Weight (lbs) 212 lb 6.4 oz 210 lb 6.4 oz 213 lb  Weight (kg) 96.344 kg 95.437 kg 96.616 kg      Telemetry    Converted to sinus bradycardia at approximately 0730 today, HR in the 50s - Personally Reviewed  ECG    Sinus rhythm HR 70 QTcB 470 - Personally Reviewed  Physical Exam   GEN: No acute distress.   Neck: No JVD Cardiac: RRR, no murmurs, rubs, or gallops.  Respiratory: Clear to auscultation bilaterally. GI: Soft, nontender, non-distended  MS: No edema; No deformity. Neuro:  Nonfocal  Psych: Normal affect   Labs    High Sensitivity Troponin:   Recent Labs  Lab 10/05/22 1715 10/05/22 1920  TROPONINIHS 25* 27*     Chemistry Recent Labs  Lab 10/05/22 1715 10/06/22 0210  NA 139 138  K 4.3 3.5  CL 107 109   CO2 21* 23  GLUCOSE 135* 126*  BUN 25* 24*  CREATININE 1.76* 1.63*  CALCIUM 9.0 8.3*  PROT  --  5.4*  ALBUMIN  --  2.4*  AST  --  24  ALT  --  15  ALKPHOS  --  56  BILITOT  --  0.9  GFRNONAA 42* 46*  ANIONGAP 11 6    Lipids No results for input(s): "CHOL", "TRIG", "HDL", "LABVLDL", "LDLCALC", "CHOLHDL" in the last 168 hours.  Hematology Recent Labs  Lab 10/05/22 1715 10/06/22 0210  WBC 6.1 5.0  RBC 5.62 5.00  HGB 14.3 12.6*  HCT 46.6 41.1  MCV 82.9 82.2  MCH 25.4* 25.2*  MCHC 30.7 30.7  RDW 23.0* 22.3*  PLT 180 145*   Thyroid No results for input(s): "TSH", "FREET4" in the last 168 hours.  BNP Recent Labs  Lab 10/05/22 1800  BNP 1,007.5*    DDimer No results for input(s): "DDIMER" in the last 168 hours.   Radiology    CT Angio Chest PE W/Cm &/Or Wo Cm  Result Date: 10/05/2022 CLINICAL DATA:  Dyspnea, atrial fibrillation EXAM:  CT ANGIOGRAPHY CHEST WITH CONTRAST TECHNIQUE: Multidetector CT imaging of the chest was performed using the standard protocol during bolus administration of intravenous contrast. Multiplanar CT image reconstructions and MIPs were obtained to evaluate the vascular anatomy. RADIATION DOSE REDUCTION: This exam was performed according to the departmental dose-optimization program which includes automated exposure control, adjustment of the mA and/or kV according to patient size and/or use of iterative reconstruction technique. CONTRAST:  74m OMNIPAQUE IOHEXOL 350 MG/ML SOLN COMPARISON:  01/30/2021 FINDINGS: Cardiovascular: Adequate opacification of the pulmonary arterial tree. No intraluminal filling defect identified to suggest acute pulmonary embolism. Central pulmonary arteries are enlarged in keeping with changes of pulmonary arterial hypertension. Extensive multi-vessel coronary artery calcification. Calcification of the aortic valve leaflets noted. Cardiac size within normal limits. No pericardial effusion. Mild atherosclerotic calcification  within the thoracic aorta. No aortic aneurysm. Mediastinum/Nodes: No enlarged mediastinal, hilar, or axillary lymph nodes. Thyroid gland, trachea, and esophagus demonstrate no significant findings. Lungs/Pleura: Lungs are clear. No pneumothorax or pleural effusion. Bronchial wall thickening is present in keeping with airway inflammation. No central obstructing lesion. Upper Abdomen: Cholelithiasis without pericholecystic inflammatory change. No acute abnormality within the visualized upper abdomen. Musculoskeletal: Osseous structures are age-appropriate. No acute bone abnormality. No lytic or blastic bone lesion. Review of the MIP images confirms the above findings. IMPRESSION: 1. No pulmonary embolism. 2. Extensive multi-vessel coronary artery calcification. 3. Morphologic changes in keeping with pulmonary arterial hypertension. 4. Calcification of the aortic valve leaflets. Echocardiography may be helpful to assess the degree of valvular dysfunction. 5. Bronchial wall thickening in keeping with airway inflammation. No central obstructing lesion. 6. Cholelithiasis. Aortic Atherosclerosis (ICD10-I70.0). Electronically Signed   By: AFidela SalisburyM.D.   On: 10/05/2022 20:06   DG Chest 2 View  Result Date: 10/05/2022 CLINICAL DATA:  Shortness of breath and fatigue. EXAM: CHEST - 2 VIEW COMPARISON:  March 27, 2022 FINDINGS: The heart size and mediastinal contours are within normal limits. Both lungs are clear. The visualized skeletal structures are unremarkable. IMPRESSION: No active cardiopulmonary disease. Electronically Signed   By: TVirgina NorfolkM.D.   On: 10/05/2022 18:12    Cardiac Studies   Echo 03/2022:  1. Left ventricular ejection fraction, by estimation, is 65 to 70%. Left  ventricular ejection fraction by 3D volume is 69 %. The left ventricle has  normal function. The left ventricle has no regional wall motion  abnormalities. Left ventricular diastolic   parameters are indeterminate.  Elevated left atrial pressure. The average  left ventricular global longitudinal strain is -22.1 %. The global  longitudinal strain is normal.   2. Right ventricular systolic function is normal. The right ventricular  size is normal. There is normal pulmonary artery systolic pressure. The  estimated right ventricular systolic pressure is 2Q000111QmmHg.   3. The mitral valve is grossly normal. Mild mitral valve regurgitation.   4. The aortic valve is calcified. Aortic valve regurgitation is not  visualized. Aortic valve mean gradient measures 11.0 mmHg.   5. Aortic dilatation noted. There is mild dilatation of the aortic root,  measuring 41 mm.   Patient Profile     67y.o. male with PAF, HTN, DM, CAD, CKD, PVD, and hepatocellular carcinoma previously on Lenvatinib who presented with Afib with RVR.     Assessment & Plan    Afib with RVR Presenting symptoms include worsening fatigue and SOB - failed tikosyn due to prolonged Qtc (?interaction with lenvatinib) - planned for amiodarone load on 10/11/22 after Lenvatinib washout, but  presented in Afib RVR - rate controlled on cardizem gtt currently running at 5 mg/hr - initial plan for cardioversion this week, but patient converted to sinus bradycardia in the 50s - telemetry currently with HR in the 60-70s - has received 12.5 mg this morning.     Chronic anticoagulation Maintained on 5 mg eliquis BID - no missed doses No bleeding issues    CAD Heart cath 01/2021 with 99% ostial D1 felt to be the culprit lesion but would need orbital atherectomy and DES. In the setting of CKD, PCI was deferred in favor of trial of medical therapy. He was brought back to the lab 02/15/21 for D1 intervention, unfortunately unsuccessful due to vessel perforation treated with protamine and balloon tamponade, no subsequent pericardial effusion.  - no chest pain   Hypertension Maintained on coreg 37.5 mg BID and 10 mg lisinopril Now on 12.5 mg coreg BID Will need  to monitor OP and adjust, given HR in the 50-70s   For questions or updates, please contact [place] Please consult [email or web address] for contact info under        Signed, Ledora Bottcher, PA  10/07/2022, 7:21 AM    Patient seen and examined and agree with Fabian Sharp, PA as detailed above.  In brief, the patient is a 67 year old male with history of AF, HTN, DM, CAD, CKD, PVD, and hepatocellular carcinoma previously on Lenvatinib who presented with Afib with RVR.  Converted to NSR this AM. Dilt gtt stopped. Will send home on current dose of coreg with plans to start his amiodarone '200mg'$  BID on 03/01 after Lenvatinib washout. Will send dilt '30mg'$  prn as needed for breakthrough as well. HR currently in 50s so cannot uptitrate nodal agents at this time but can reassess as outpatient.   GEN: No acute distress.   Neck: No JVD Cardiac: Bradycardic, regular, 2/6 systolic murmur Respiratory: Clear to auscultation bilaterally. GI: Soft, nontender, non-distended  MS: No edema; No deformity. Neuro:  Nonfocal  Psych: Normal affect    Plan: -Discharge home today -Continue coreg 12.'5mg'$  BID; cannot uptitrate due to HR 50s; can reassess as outpatient -Plan to start amiodarone '200mg'$  BID on 03/01 after Lenvatinib washout -Continue apixaban '5mg'$  BID -Will give dilt '30mg'$  prn for breakthrough episodes -Will follow-up with EP as scheduled  Nickie Warwick,MD

## 2022-10-07 NOTE — Telephone Encounter (Addendum)
Oral Oncology Pharmacist Encounter  Received new prescription for Cabometyx (cabozantinib) for the treatment of [place], planned duration until disease progression or unacceptable drug toxicity. Patient was on lenvatinib but d/t QTc prolongation issues and   Hepatic function panel from 10/06/22 assessed, no relevant lab abnormalities. Prescription dose and frequency assessed.   Current medication list in Epic reviewed, a few DDIs with cabozantinib identified:  Apixaban: Apixaban may enhance the adverse/toxic effect of Cabozantinib. Monitor for signs of thrombocytopenia and neutropenia. Category C, monitor therapy  Diltiazem:  Diltiazem may increase the serum concentration of Cabozantinib. Monitor for increased cabozantinib toxicities. Category C, monitor therapy   Evaluated chart and no patient barriers to medication adherence identified.   Prescription has been e-scribed to the [facility] for benefits analysis and approval.  Oral Oncology Clinic will continue to follow for insurance authorization, copayment issues, initial counseling and start date.  Patient agreed to treatment on 10/02/22 per MD documentation.  Darl Pikes, PharmD, BCPS, BCOP, CPP Hematology/Oncology Clinical Pharmacist Practitioner [place]/DB/AP Oral [facility] [phone]  10/07/2022 11:37 AM

## 2022-10-07 NOTE — Telephone Encounter (Signed)
Oral Chemotherapy Pharmacist Encounter  Matthew Chen will pick up his Cabometyx from [facility] on 10/08/22. He will get started when he has medication in hand.  Patient Education I spoke with patient for overview of new oral chemotherapy medication: Cabometyx (cabozantinib) for the treatment of [place], planned duration until disease progression or unacceptable drug toxicity. Patient was on lenvatinib but d/t QTc prolongation issues and   Counseled patient on administration, dosing, side effects, monitoring, drug-food interactions, safe handling, storage, and disposal. Patient will take 1 tablet (60 mg total) by mouth daily. Take on an empty stomach, 1 hour before or 2 hours after meals.  Side effects include but not limited to: diarrhea, nausea, fatigue, hand-foot syndrome, mouth sores, hypertension, decrease wbc/hgb/plt.    Reviewed with patient importance of keeping a medication schedule and plan for any missed doses.  After discussion with patient no patient barriers to medication adherence identified.   Matthew Chen voiced understanding and appreciation. All questions answered. Medication handout provided.  Provided patient with [facility] phone number. Patient knows to call the office with questions or concerns. Oral Chemotherapy Navigation Clinic will continue to follow.  Darl Pikes, PharmD, BCPS, BCOP, CPP Hematology/Oncology Clinical Pharmacist Practitioner [place]/DB/AP Oral [place] [facility] [phone]  10/07/2022 12:28 PM

## 2022-10-07 NOTE — Telephone Encounter (Signed)
Oral Oncology Patient Advocate Encounter  Prior Authorization for Cabometyx has been approved.    PA# [id number]  Effective dates: 10/07/22 through 10/07/23  Patients co-pay is $0.00.  Patient also has active grants on file to take co-pay down to $0 if insurance co-pay changes.     Berdine Addison, [facility] Patient [place]  [phone] (phone) [phone] (fax) 10/07/2022 9:32 AM

## 2022-10-07 NOTE — Telephone Encounter (Signed)
Oral Oncology Patient Advocate Encounter  New authorization   Received notification that prior authorization for Cabometyx is required.   PA submitted on 10/07/22  Key [id number]  Status is pending     Matthew Chen, [place] Patient [place]  [phone] (phone) [phone] (fax) 10/07/2022 8:38 AM

## 2022-10-07 NOTE — Telephone Encounter (Signed)
Patient successfully OnBoarded and scheduled to pick medication up 10/08/22 at [facility].

## 2022-10-08 ENCOUNTER — Other Ambulatory Visit (HOSPITAL_COMMUNITY): Payer: Self-pay

## 2022-10-08 ENCOUNTER — Telehealth: Payer: Self-pay

## 2022-10-08 SURGERY — CARDIOVERSION
Anesthesia: General

## 2022-10-08 NOTE — Transitions of Care (Post Inpatient/ED Visit) (Signed)
   10/08/2022  Name: SHAFT TIA MRN: [id number] DOB: 1955/10/09  Today's TOC FU Call Status: Today's TOC FU Call Status:: Successful TOC FU Call Competed TOC FU Call Complete Date: 10/08/22  Transition Care Management Follow-up Telephone Call Date of Discharge: 10/07/22 Discharge Facility: [facility]) Type of Discharge: Inpatient Admission Primary Inpatient Discharge Diagnosis:: dyspnea How have you been since you were released from the hospital?: Better Any questions or concerns?: No  Items Reviewed: Did you receive and understand the discharge instructions provided?: Yes Medications obtained and verified?: Yes (Medications Reviewed) Any new allergies since your discharge?: No Dietary orders reviewed?: Yes Do you have support at home?: Yes People in Home: spouse  Home Care and Equipment/Supplies: [place] Ordered?: NA Any new equipment or medical supplies ordered?: NA  Functional Questionnaire: Do you need assistance with bathing/showering or dressing?: No Do you need assistance with meal preparation?: No Do you need assistance with eating?: No Do you have difficulty maintaining continence: No Do you need assistance with getting out of bed/getting out of a chair/moving?: No Do you have difficulty managing or taking your medications?: No  Folllow up appointments reviewed: PCP Follow-up appointment confirmed?: NA Specialist Hospital Follow-up appointment confirmed?: Yes Date of Specialist follow-up appointment?: 10/30/22 Follow-Up Specialty Provider:: emily Monge Do you need transportation to your follow-up appointment?: No Do you understand care options if your condition(s) worsen?: Yes-patient verbalized understanding    SIGNATURE Juanda Crumble, [place] Nurse Health Advisor Direct Dial [phone]

## 2022-10-09 ENCOUNTER — Other Ambulatory Visit (HOSPITAL_COMMUNITY): Payer: Self-pay

## 2022-10-10 DIAGNOSIS — I480 Paroxysmal atrial fibrillation: Secondary | ICD-10-CM | POA: Diagnosis not present

## 2022-10-16 ENCOUNTER — Other Ambulatory Visit (HOSPITAL_COMMUNITY): Payer: Self-pay

## 2022-10-18 ENCOUNTER — Inpatient Hospital Stay: Payer: PPO | Attending: Oncology

## 2022-10-18 ENCOUNTER — Inpatient Hospital Stay (HOSPITAL_BASED_OUTPATIENT_CLINIC_OR_DEPARTMENT_OTHER): Payer: PPO | Admitting: Oncology

## 2022-10-18 VITALS — BP 161/82 | HR 60 | Temp 98.1°F | Resp 18 | Ht 70.0 in | Wt 208.0 lb

## 2022-10-18 DIAGNOSIS — I2510 Atherosclerotic heart disease of native coronary artery without angina pectoris: Secondary | ICD-10-CM | POA: Diagnosis not present

## 2022-10-18 DIAGNOSIS — I480 Paroxysmal atrial fibrillation: Secondary | ICD-10-CM | POA: Insufficient documentation

## 2022-10-18 DIAGNOSIS — E119 Type 2 diabetes mellitus without complications: Secondary | ICD-10-CM | POA: Insufficient documentation

## 2022-10-18 DIAGNOSIS — I739 Peripheral vascular disease, unspecified: Secondary | ICD-10-CM | POA: Insufficient documentation

## 2022-10-18 DIAGNOSIS — D696 Thrombocytopenia, unspecified: Secondary | ICD-10-CM | POA: Diagnosis not present

## 2022-10-18 DIAGNOSIS — Z79899 Other long term (current) drug therapy: Secondary | ICD-10-CM | POA: Diagnosis not present

## 2022-10-18 DIAGNOSIS — C220 Liver cell carcinoma: Secondary | ICD-10-CM | POA: Insufficient documentation

## 2022-10-18 DIAGNOSIS — K5190 Ulcerative colitis, unspecified, without complications: Secondary | ICD-10-CM | POA: Diagnosis not present

## 2022-10-18 DIAGNOSIS — I10 Essential (primary) hypertension: Secondary | ICD-10-CM | POA: Insufficient documentation

## 2022-10-18 LAB — CBC WITH DIFFERENTIAL (CANCER CENTER ONLY)
Abs Immature Granulocytes: 0.01 10*3/uL (ref 0.00–0.07)
Basophils Absolute: 0.1 10*3/uL (ref 0.0–0.1)
Basophils Relative: 1 %
Eosinophils Absolute: 0.2 10*3/uL (ref 0.0–0.5)
Eosinophils Relative: 5 %
HCT: 44.2 % (ref 39.0–52.0)
Hemoglobin: 13.7 g/dL (ref 13.0–17.0)
Immature Granulocytes: 0 %
Lymphocytes Relative: 20 %
Lymphs Abs: 0.9 10*3/uL (ref 0.7–4.0)
MCH: 25 pg — ABNORMAL LOW (ref 26.0–34.0)
MCHC: 31 g/dL (ref 30.0–36.0)
MCV: 80.8 fL (ref 80.0–100.0)
Monocytes Absolute: 0.6 10*3/uL (ref 0.1–1.0)
Monocytes Relative: 12 %
Neutro Abs: 2.9 10*3/uL (ref 1.7–7.7)
Neutrophils Relative %: 62 %
Platelet Count: 205 10*3/uL (ref 150–400)
RBC: 5.47 MIL/uL (ref 4.22–5.81)
RDW: 22.3 % — ABNORMAL HIGH (ref 11.5–15.5)
WBC Count: 4.6 10*3/uL (ref 4.0–10.5)
nRBC: 0 % (ref 0.0–0.2)

## 2022-10-18 LAB — CMP (CANCER CENTER ONLY)
ALT: 26 U/L (ref 0–44)
AST: 42 U/L — ABNORMAL HIGH (ref 15–41)
Albumin: 3.7 g/dL (ref 3.5–5.0)
Alkaline Phosphatase: 102 U/L (ref 38–126)
Anion gap: 9 (ref 5–15)
BUN: 22 mg/dL (ref 8–23)
CO2: 23 mmol/L (ref 22–32)
Calcium: 9.1 mg/dL (ref 8.9–10.3)
Chloride: 104 mmol/L (ref 98–111)
Creatinine: 1.49 mg/dL — ABNORMAL HIGH (ref 0.61–1.24)
GFR, Estimated: 51 mL/min — ABNORMAL LOW (ref >60)
Glucose, Bld: 127 mg/dL — ABNORMAL HIGH (ref 70–99)
Potassium: 4.1 mmol/L (ref 3.5–5.1)
Sodium: 136 mmol/L (ref 135–145)
Total Bilirubin: 0.7 mg/dL (ref 0.3–1.2)
Total Protein: 7.1 g/dL (ref 6.5–8.1)

## 2022-10-18 LAB — TOTAL PROTEIN, URINE DIPSTICK: Protein, ur: NEGATIVE mg/dL

## 2022-10-18 NOTE — Progress Notes (Signed)
[place] OFFICE PROGRESS NOTE   Diagnosis: Hepatocellular carcinoma  INTERVAL HISTORY:   Matthew Chen returns as scheduled.  He was able to take Tikosyn due to prolongation of the QT interval.  I reviewed options with cardiology.  We decided to change to cabozantinib for treatment of the [facility] and amiodarone for treating the atrial fibrillation.  He was admitted with rapid atrial fibrillation and dyspnea on 10/05/2022.  He converted to sinus rhythm.  He was continued on carvedilol and has started amiodarone.  He began cabozantinib on 10/11/2022.  He reports improvement in dyspnea. No rash or diarrhea. Objective:  Vital signs in last 24 hours:  Blood pressure (!) 161/82, pulse 60, temperature 98.1 F (36.7 C), temperature source Oral, resp. rate 18, height '5\' 10"'$  (1.778 m), weight 208 lb (94.3 kg), SpO2 98 %.    HEENT: No thrush, 2 mm ulcer at the anterior right buccal mucosa Resp: Breath sounds, no respiratory distress Cardio: Regular rate and rhythm GI: No hepatosplenomegaly Vascular: No leg edema  Skin: Palmar erythema   Lab Results:  Lab Results  Component Value Date   WBC 4.6 10/18/2022   HGB 13.7 10/18/2022   HCT 44.2 10/18/2022   MCV 80.8 10/18/2022   PLT 205 10/18/2022   NEUTROABS 2.9 10/18/2022    CMP  Lab Results  Component Value Date   NA 136 10/18/2022   K 4.1 10/18/2022   CL 104 10/18/2022   CO2 23 10/18/2022   GLUCOSE 127 (H) 10/18/2022   BUN 22 10/18/2022   CREATININE 1.49 (H) 10/18/2022   CALCIUM 9.1 10/18/2022   PROT 7.1 10/18/2022   ALBUMIN 3.7 10/18/2022   AST 42 (H) 10/18/2022   ALT 26 10/18/2022   ALKPHOS 102 10/18/2022   BILITOT 0.7 10/18/2022   GFRNONAA 51 (L) 10/18/2022   GFRAA 49 (L) 11/20/2020    Lab Results  Component Value Date   CEA1 1.9 06/21/2021    Medications: I have reviewed the patient's current medications.   Assessment/Plan: Hepatocellular carcinoma CT angio chest aorta 01/30/2021-no evidence of  thoracic aortic aneurysm, diffuse hepatic steatosis, suggestion of early cirrhosis, 7.1 cm enhancing masslike area in segment 5/6, prominent gastrohepatic and periportal nodes measuring up to 7 mm MRI liver 03/22/2021-arterial hyperenhancing subcapsular mass, segment 6, 7 x 5.1 cm, LI-RADS 5, no pathologically large lymph nodes Ultrasound-guided biopsy 04/24/2021-hepatocellular carcinoma Y90 radioembolization of segment 6 and segment 7 hepatic arterial branches 06/21/2021 MRI abdomen 09/03/2021-new increased enhancement inferior and superior to the dominant right liver lesion, new 11 mm enhancing lesion in the left liver LIRADS 3, dominant right hepatic lesion stable in size MRI abdomen 12/19/2021-unchanged hyperenhancing mass in the anterior inferior right liver measuring 7.8 x 5 cm, interval enlargement of a arterial enhancing lesion in segment 2- LIRADS 5, new small enhancing lesions hepatic segment 4A measuring 1.2 x 0.9 cm and 0.8 x 0.6 cm- LIRADS 5 Cycle 1 atezolizumab /bevacizumab 02/07/2022 Cycle 2 atezolizumab/bevacizumab 02/27/2022 Cycle 3 atezolizumab/bevacizumab 03/21/2022 Cycle 4 atezolizumab/bevacizumab 04/22/2022 MRI abdomen 05/05/2022-no change in dominant segment 6 lesion, no change in hyperenhancing lesions in segment 4A and segment 2, no new lesions Cycle 5 atezolizumab/bevacizumab 05/13/2022 Cycle 6 atezolizumab/bevacizumab 06/04/2022 Cycle 7 atezolizumab/bevacizumab 06/24/2022 Cycle 8 atezolizumab/bevacizumab 07/15/2022 MRI abdomen 07/20/2022-mild progression of bilateral liver lesions, no evidence of extrahepatic metastatic disease MRI [facility] 08/19/2022-increased size of the dominant admitted 5/6 lesion, now measuring 12.7 x 8.8 cm, multiple additional hepatic lesions appear larger than on the previous exam, stable mildly enlarged upper  abdominal lymph nodes Lenvatinib 09/09/2022, stopped 10/04/2022 Cabozantinib 10/11/2022 Ulcerative colitis-maintained on vedolizumab Peripheral arterial  disease History of a thoracic aortic aneurysm CAD Diabetes Hypertension Chronic renal failure Paroxysmal atrial fibrillation      Disposition: Matthew Chen has hepatocellular carcinoma.  He developed disease progression following treatment with a tislelizumab/bevacizumab.  He began lenvatinib 09/09/2022.  Lenvatinib was discontinued 10/04/2022 secondary to potential prolongation of the QT interval with concurrent Tikosyn.  Treatment was changed to cabozantinib beginning 10/11/2022.  He is tolerating the cabozantinib well to date.  Will continue follow-up with cardiology for management of atrial fibrillation.  Matthew Chen will return for an office and lab visit in 2 to 3 weeks.  Betsy Coder, MD  10/18/2022  9:55 AM

## 2022-10-23 NOTE — Progress Notes (Signed)
10/02/2022 Name: Matthew Chen MRN: [id number] DOB: May 14, 1956   CANNEN OSHEL is a 67 y.o. year old male who presented for a telephone visit.   They were referred to the pharmacist by their PCP for assistance in managing medication access.    Subjective:  Medication Access/Adherence  Current Pharmacy:  [id number] [place], [place] [address]Phone: [phone]: [address]Phone: [phone]: [phone]  Patient reports affordability concerns with their medications: Yes  Patient reports access/transportation concerns to their pharmacy: No  Patient reports adherence concerns with their medications:  No    Medication Management:  Patient reports needing medication assistance for Jardiance  Patient reports the following barriers to adherence: cost   Objective:  Lab Results  Component Value Date   HGBA1C 6.8 (H) 05/27/2022    Lab Results  Component Value Date   CREATININE 1.49 (H) 10/18/2022   BUN 22 10/18/2022   NA 136 10/18/2022   K 4.1 10/18/2022   CL 104 10/18/2022   CO2 23 10/18/2022    Lab Results  Component Value Date   CHOL 110 05/27/2022   HDL 36 (L) 05/27/2022   LDLCALC 49 05/27/2022   TRIG 175 (H) 05/27/2022   CHOLHDL 3.1 05/27/2022    Medications Reviewed Today     Reviewed by SLadell Pier MD (Physician) on 10/18/22 at [phone] Med List Status: <None>   Medication Order Taking? Sig Documenting Provider Last Dose Status Informant  acetaminophen (TYLENOL) 500 MG tablet [id number] Take 1 tablet (500 mg total) by mouth every 6 (six) hours as needed. NVarney Biles MD Taking Active Self  amiodarone (PACERONE) 200 MG tablet [id number] Take 1 tablet (200 mg total) by mouth 2 (two) times daily. TShirley Friar [name] Taking Active Self  apixaban  (ELIQUIS) 5 MG TABS tablet [id number] Take 1 tablet (5 mg total) by mouth 2 (two) times daily. BLorretta Harp MD Taking Active Self  aspirin EC 81 MG tablet [id number] Take 1 tablet (81 mg total) by mouth daily. RSkeet Latch MD Taking Active Self  atorvastatin (LIPITOR) 80 MG tablet [id number] TAKE 1 TABLET BY MOUTH EVERY DAY BLorretta Harp MD Taking Active Self  cabozantinib (CABOMETYX) 60 MG tablet [id number] Take 1 tablet (60 mg total) by mouth daily. Take on an empty stomach, 1 hour before or 2 hours after meals. SLadell Pier MD Taking Active   carvedilol (COREG) 12.5 MG tablet [id number] Take 1 tablet (12.5 mg total) by mouth 2 (two) times daily with a meal. [place], ATami Lin PA Taking Active   cyanocobalamin (VITAMIN B12) 1000 MCG tablet [id number] Take 1,000 mcg by mouth daily. [provider] Taking Active Self  diltiazem (CARDIZEM) 30 MG tablet [id number] Take 1 tablet (30 mg total) by mouth 3 (three) times daily as needed (palpitations).  Patient not taking: Reported on 10/18/2022   DLedora Bottcher PA Not Taking Active   Dulaglutide (TRULICITY) 3 M0000000SOPN [id number] Inject 3 mg as directed once a week. PSusy Frizzle MD Taking Active Self  empagliflozin (JARDIANCE) 10 MG TABS tablet [id number] Take 1 tablet (10 mg total) by mouth daily. DLedora Bottcher PA Taking Active   fenofibrate 160 MG tablet [id number] Take 1 tablet (160 mg total)  by mouth daily. Susy Frizzle, MD Taking Active Self  glucose blood [facility] ULTRA) test strip [id number] Yes CHECK BLOOD SUGAR TWICE A DAY Susy Frizzle, MD Taking Active Self  HYDROcodone-acetaminophen (NORCO) 7.5-325 MG tablet [id number] No Take 1 tablet by mouth every 6 (six) hours as needed for moderate pain.  Patient not taking: Reported on 10/18/2022   Susy Frizzle, MD Not Taking Active Self  Insulin Pen Needle (B-D UF III MINI PEN NEEDLES) 31G X 5 MM MISC [id number]  Yes Use daily with Mel Almond, MD Taking Active Self  lisinopril (ZESTRIL) 20 MG tablet [id number] Yes Take 1 tablet (20 mg total) by mouth daily. Almyra Deforest, [place] Taking Active Self  Omega-3 Fatty Acids (FISH OIL) 1000 MG CAPS [id number] Yes Take 1,000 mg by mouth 2 (two) times daily. [provider] Taking Active Self  pantoprazole (PROTONIX) 40 MG tablet [id number] Yes Take 1 tablet (40 mg total) by mouth 2 (two) times daily. Susy Frizzle, MD Taking Active Self  pioglitazone (ACTOS) 30 MG tablet [id number] Yes TAKE 1 TABLET BY MOUTH EVERY DAY. STRENGTH: 30 MG Pickard, Cammie Mcgee, MD Taking Active Self  sildenafil (VIAGRA) 100 MG tablet [id number] Yes Take 0.5-1 tablets (50-100 mg total) by mouth daily as needed for erectile dysfunction. Susy Frizzle, MD Taking Active Self  vedolizumab (ENTYVIO) 300 MG injection [id number] Yes Inject 300 mg as directed See admin instructions. Every 2 months [provider] Taking Active Self              Assessment/Plan:   Patient continues on Jardiance for his cardiac conditions and diabetes.  Enrolled patient in the [place] for [employer].  Copay card was sent to patient via my chart.  Patient to present at the pharmacy to cover his remaining portion of his Jardiance copay.  This grant is active for 1 year and must be renewed annually.  Follow Up Plan: patient encouraged to reach out if needs arise  Regina Eck, PharmD, [place] Pharmacist, [place]

## 2022-10-24 ENCOUNTER — Other Ambulatory Visit: Payer: Self-pay

## 2022-10-25 ENCOUNTER — Other Ambulatory Visit: Payer: Self-pay | Admitting: Oncology

## 2022-10-25 ENCOUNTER — Other Ambulatory Visit: Payer: Self-pay

## 2022-10-25 ENCOUNTER — Other Ambulatory Visit (HOSPITAL_COMMUNITY): Payer: Self-pay

## 2022-10-25 DIAGNOSIS — C220 Liver cell carcinoma: Secondary | ICD-10-CM

## 2022-10-25 MED ORDER — CABOMETYX 60 MG PO TABS
60.0000 mg | ORAL_TABLET | Freq: Every day | ORAL | 0 refills | Status: DC
  Filled 2022-10-25: qty 30, 30d supply, fill #0

## 2022-10-28 ENCOUNTER — Other Ambulatory Visit (HOSPITAL_COMMUNITY): Payer: Self-pay

## 2022-10-28 ENCOUNTER — Other Ambulatory Visit: Payer: Self-pay

## 2022-10-28 ENCOUNTER — Inpatient Hospital Stay: Payer: PPO

## 2022-10-28 ENCOUNTER — Inpatient Hospital Stay: Payer: PPO | Admitting: Nurse Practitioner

## 2022-10-30 ENCOUNTER — Ambulatory Visit: Payer: PPO | Attending: Nurse Practitioner | Admitting: Nurse Practitioner

## 2022-10-30 ENCOUNTER — Encounter: Payer: Self-pay | Admitting: Nurse Practitioner

## 2022-10-30 VITALS — BP 118/70 | HR 67 | Ht 70.0 in | Wt 206.8 lb

## 2022-10-30 DIAGNOSIS — C220 Liver cell carcinoma: Secondary | ICD-10-CM | POA: Diagnosis not present

## 2022-10-30 DIAGNOSIS — E785 Hyperlipidemia, unspecified: Secondary | ICD-10-CM

## 2022-10-30 DIAGNOSIS — I10 Essential (primary) hypertension: Secondary | ICD-10-CM

## 2022-10-30 DIAGNOSIS — N1832 Chronic kidney disease, stage 3b: Secondary | ICD-10-CM

## 2022-10-30 DIAGNOSIS — I350 Nonrheumatic aortic (valve) stenosis: Secondary | ICD-10-CM | POA: Diagnosis not present

## 2022-10-30 DIAGNOSIS — I7121 Aneurysm of the ascending aorta, without rupture: Secondary | ICD-10-CM | POA: Diagnosis not present

## 2022-10-30 DIAGNOSIS — I739 Peripheral vascular disease, unspecified: Secondary | ICD-10-CM

## 2022-10-30 DIAGNOSIS — I4819 Other persistent atrial fibrillation: Secondary | ICD-10-CM | POA: Diagnosis not present

## 2022-10-30 DIAGNOSIS — I2510 Atherosclerotic heart disease of native coronary artery without angina pectoris: Secondary | ICD-10-CM | POA: Diagnosis not present

## 2022-10-30 MED ORDER — APIXABAN 5 MG PO TABS: 5.0000 mg | ORAL_TABLET | Freq: Two times a day (BID) | ORAL | 3 refills | Status: DC

## 2022-10-30 MED ORDER — EMPAGLIFLOZIN 10 MG PO TABS: 10.0000 mg | ORAL_TABLET | Freq: Every day | ORAL | 3 refills | Status: DC

## 2022-10-30 MED ORDER — DILTIAZEM HCL 30 MG PO TABS: 30.0000 mg | ORAL_TABLET | Freq: Three times a day (TID) | ORAL | 3 refills | Status: DC | PRN

## 2022-10-30 MED ORDER — ATORVASTATIN CALCIUM 80 MG PO TABS: 80.0000 mg | ORAL_TABLET | Freq: Every day | ORAL | 3 refills | Status: DC

## 2022-10-30 MED ORDER — CARVEDILOL 12.5 MG PO TABS: 12.5000 mg | ORAL_TABLET | Freq: Two times a day (BID) | ORAL | 3 refills | Status: DC

## 2022-10-30 MED ORDER — LISINOPRIL 20 MG PO TABS: 20.0000 mg | ORAL_TABLET | Freq: Every day | ORAL | 3 refills | Status: DC

## 2022-10-30 NOTE — Progress Notes (Signed)
Office Visit    Patient Name: Matthew Chen Date of Encounter: 10/30/2022  Primary Care Provider:  Susy Frizzle, MD Primary Cardiologist:  Quay Burow, MD  Chief Complaint    67 year old male with a history of CAD, persistent atrial fibrillation, thoracic aortic aneurysm, aortic stenosis, hypertension, hyperlipidemia, PAD, CKD stage III, type 2 diabetes, hepatocellular carcinoma, and former tobacco use who presents for hospital follow-up related to atrial fibrillation.  Past Medical History    Past Medical History:  Diagnosis Date   Adenomatous colon polyp 03/2008   Anginal pain ([place])    Aortic aneurysm, thoracic ([place]) 09/09/2016   4.4 cm by echo 05/2015   Arthritis    "joints in my fingers ache" (07/13/2015)   Bicuspid aortic valve 09/09/2016   Cataract 2019   bilateral eyes   CKD (chronic kidney disease), stage III ([place])    Coronary artery disease    Diabetes mellitus without complication ([place])    Hemorrhoids    Hepatocellular carcinoma ([place])    Hyperlipidemia    Hypertension    Paroxysmal atrial fibrillation ([place])    Peripheral arterial disease ([place])    a. dopppler 05/29/2015 revealed high-grade right popliteal and distal left SFA disease b. LE angio 06/28/2015 revealing high grade calcific dx with distal L SFA and R popliteal artery s/p diamondback orbital rotational atherectomy, PTA of L SFA  c. 07/13/2015 diamondback orbital rotational atherectomy and drug eluting balloon angioplasty of R popliteal artery (P2 segment) using distal protection    Type II diabetes mellitus ([place])    Ulcerative colitis    Past Surgical History:  Procedure Laterality Date   ABDOMINAL AORTOGRAM W/LOWER EXTREMITY N/A 04/27/2018   Procedure: ABDOMINAL AORTOGRAM W/LOWER EXTREMITY;  Surgeon: Lorretta Harp, MD;  Location: [facility];  Service: Cardiovascular;  Laterality: N/A;   COLONOSCOPY     COLONOSCOPY W/ POLYPECTOMY  X 4-5   CORONARY ATHERECTOMY N/A 02/15/2021    Procedure: CORONARY ATHERECTOMY;  Surgeon: Lorretta Harp, MD;  Location: [facility];  Service: Cardiovascular;  Laterality: N/A;  aborted    CORONARY BALLOON ANGIOPLASTY N/A 02/15/2021   Procedure: CORONARY BALLOON ANGIOPLASTY;  Surgeon: Lorretta Harp, MD;  Location: [facility];  Service: Cardiovascular;  Laterality: N/A;   COSMETIC SURGERY  < 2000   "removed birthmark from top of my head"   IR ANGIOGRAM SELECTIVE EACH ADDITIONAL VESSEL  06/08/2021   IR ANGIOGRAM SELECTIVE EACH ADDITIONAL VESSEL  06/08/2021   IR ANGIOGRAM SELECTIVE EACH ADDITIONAL VESSEL  06/08/2021   IR ANGIOGRAM SELECTIVE EACH ADDITIONAL VESSEL  06/08/2021   IR ANGIOGRAM SELECTIVE EACH ADDITIONAL VESSEL  06/21/2021   IR ANGIOGRAM SELECTIVE EACH ADDITIONAL VESSEL  06/21/2021   IR ANGIOGRAM SELECTIVE EACH ADDITIONAL VESSEL  06/21/2021   IR ANGIOGRAM SELECTIVE EACH ADDITIONAL VESSEL  06/21/2021   IR ANGIOGRAM VISCERAL SELECTIVE  06/08/2021   IR ANGIOGRAM VISCERAL SELECTIVE  06/21/2021   IR EMBO ARTERIAL NOT HEMORR HEMANG INC GUIDE ROADMAPPING  06/08/2021   IR EMBO TUMOR ORGAN ISCHEMIA INFARCT INC GUIDE ROADMAPPING  06/21/2021   IR EMBO TUMOR ORGAN ISCHEMIA INFARCT INC GUIDE ROADMAPPING  06/21/2021   IR RADIOLOGIST EVAL & MGMT  05/08/2021   IR RADIOLOGIST EVAL & MGMT  07/26/2021   IR RADIOLOGIST EVAL & MGMT  09/05/2021   IR RADIOLOGIST EVAL & MGMT  12/27/2021   IR US GUIDE VASC ACCESS LEFT  06/21/2021   IR US GUIDE VASC ACCESS RIGHT  06/08/2021   LEFT  HEART CATH AND CORONARY ANGIOGRAPHY N/A 02/08/2021   Procedure: LEFT HEART CATH AND CORONARY ANGIOGRAPHY;  Surgeon: Lorretta Harp, MD;  Location: [facility];  Service: Cardiovascular;  Laterality: N/A;   PERIPHERAL VASCULAR ATHERECTOMY  04/27/2018   Procedure: PERIPHERAL VASCULAR ATHERECTOMY;  Surgeon: Lorretta Harp, MD;  Location: [facility];  Service: Cardiovascular;;  right popliteal artery   PERIPHERAL VASCULAR CATHETERIZATION  N/A 06/29/2015   Procedure: Lower Extremity Angiography;  Surgeon: Lorretta Harp, MD;  Location: [facility];  Service: Cardiovascular;  Laterality: N/A;   PERIPHERAL VASCULAR CATHETERIZATION N/A 07/13/2015   Procedure: Lower Extremity Angiography;  Surgeon: Lorretta Harp, MD;  Location: [facility];  Service: Cardiovascular;  Laterality: N/A;   PERIPHERAL VASCULAR CATHETERIZATION  07/13/2015   Procedure: Peripheral Vascular Atherectomy;  Surgeon: Lorretta Harp, MD;  Location: [facility];  Service: Cardiovascular;;   PERIPHERAL VASCULAR INTERVENTION  04/27/2018   Procedure: PERIPHERAL VASCULAR INTERVENTION;  Surgeon: Lorretta Harp, MD;  Location: [facility];  Service: Cardiovascular;;  right popliteal artery   POLYPECTOMY     TONSILLECTOMY     UPPER GASTROINTESTINAL ENDOSCOPY      Allergies  Allergies  Allergen Reactions   Penicillins Other (See Comments)    Unsure, childhood reaction Has patient had a PCN reaction causing immediate rash, facial/tongue/throat swelling, SOB or lightheadedness with hypotension: Unknown Has patient had a PCN reaction causing severe rash involving mucus membranes or skin necrosis: Unknown Has patient had a PCN reaction that required hospitalization: No Has patient had a PCN reaction occurring within the last 10 years: No If all of the above answers are "NO", then may proceed with Cephalosporin use.      Labs/Other Studies Reviewed    The following studies were reviewed today: Echo 03/18/2022:  1. Left ventricular ejection fraction, by estimation, is 65 to 70%. Left  ventricular ejection fraction by 3D volume is 69 %. The left ventricle has  normal function. The left ventricle has no regional wall motion  abnormalities. Left ventricular diastolic   parameters are indeterminate. Elevated left atrial pressure. The average  left ventricular global longitudinal strain is -22.1 %. The global  longitudinal strain is  normal.   2. Right ventricular systolic function is normal. The right ventricular  size is normal. There is normal pulmonary artery systolic pressure. The  estimated right ventricular systolic pressure is [id number] mmHg.   3. The mitral valve is grossly normal. Mild mitral valve regurgitation.   4. The aortic valve is calcified. Aortic valve regurgitation is not  visualized. Aortic valve mean gradient measures 11.0 mmHg.   5. Aortic dilatation noted. There is mild dilatation of the aortic root,  measuring 41 mm.   Comparison(s): Prior images reviewed side by side. Similar gradients and  aortic sizing from prior. In prior images, called functionally bicuspid vs  trileaflet based of the PSAX imaging. Similar morphology from 2022.   Recent Labs: 08/14/2022: TSH 0.996 09/28/2022: Magnesium 2.1 10/05/2022: B Natriuretic Peptide 1,007.5 10/18/2022: ALT 26; BUN 22; Creatinine 1.49; Hemoglobin 13.7; Platelet Count 205; Potassium 4.1; Sodium 136  Recent Lipid Panel    Component Value Date/Time   CHOL 110 05/27/2022 0835   CHOL 119 10/07/2016 0843   TRIG 175 (H) 05/27/2022 0835   HDL 36 (L) 05/27/2022 0835   HDL 26 (L) 10/07/2016 0843   CHOLHDL 3.1 05/27/2022 0835   VLDL 47 (H) 11/25/2016 0838   LDLCALC 49 05/27/2022  0835    History of Present Illness    67 year old male with the above past medical history including CAD, persistent atrial fibrillation, thoracic aortic aneurysm, aortic stenosis, hypertension, hyperlipidemia, PAD, CKD stage III, type 2 diabetes, hepatocellular carcinoma, and former tobacco use.  Cardiac catheterization in June 2022 revealed high-grade ostial diagonal 1 disease.  He returned in July 2022 for staged PCI however, this was complicated by coronary perforation and procedure was aborted.  He has been managed medically.  He has a history of PAD s/p orbital rotational atherectomy and drug-eluting balloon angioplasty of distal left SFA in November 2016, followed by another  orbital rotational atherectomy and balloon angioplasty for high-grade subocclusive right popliteal plaque in December 2016.  This has been managed by [name].  Has a history of persistent atrial fibrillation with prior admission for Tikosyn load.  Unfortunately, he developed prolonged QTc and medication was stopped.  Prolonged QT C was thought to be due to drug interaction with lenvatinib.  He is on Eliquis.  He was last seen in the office on 10/04/2022 by [name], PA.  He was maintaining sinus rhythm at the time.  He was started on amiodarone.  He presented to the ED on 10/05/2022 with worsening fatigue, shortness of breath, found to be in A-fib with RVR.  There was initial plan for cardioversion, however, he ultimately converted to sinus bradycardia on IV diltiazem.  He was discharged home in stable condition on 10/07/2022 on p.o. carvedilol, amiodarone, as needed diltiazem and Eliquis.  He presents today for follow-up accompanied by his wife.  Since his hospitalization Has done well from a cardiac standpoint.  He is maintaining sinus rhythm, denies any breakthrough A-fib, palpitations. He notes occasional mild dyspnea on exertion, unchanged from prior visits. He is concerned that Eliquis will be too expensive for him to afford once he reaches the donut hole.H e is traveling to [place] next month for 10 days. Overall, he reports feeling well.   Home Medications    Current Outpatient Medications  Medication Sig Dispense Refill   acetaminophen (TYLENOL) 500 MG tablet Take 1 tablet (500 mg total) by mouth every 6 (six) hours as needed. 30 tablet 0   amiodarone (PACERONE) 200 MG tablet Take 1 tablet (200 mg total) by mouth 2 (two) times daily. 180 tablet 3   aspirin EC 81 MG tablet Take 1 tablet (81 mg total) by mouth daily. 90 tablet 3   cabozantinib (CABOMETYX) 60 MG tablet Take 1 tablet (60 mg total) by mouth daily. Take on an empty stomach, 1 hour before or 2 hours after meals. 30 tablet 0    cyanocobalamin (VITAMIN B12) 1000 MCG tablet Take 1,000 mcg by mouth daily.     Dulaglutide (TRULICITY) 3 0000000 SOPN Inject 3 mg as directed once a week. 4 mL 1   fenofibrate 160 MG tablet Take 1 tablet (160 mg total) by mouth daily. 90 tablet 3   glucose blood (ONETOUCH ULTRA) test strip CHECK BLOOD SUGAR TWICE A DAY 50 strip 15   Insulin Pen Needle (B-D UF III MINI PEN NEEDLES) 31G X 5 MM MISC Use daily with Victoza 100 each 11   Omega-3 Fatty Acids (FISH OIL) 1000 MG CAPS Take 1,000 mg by mouth 2 (two) times daily.     pantoprazole (PROTONIX) 40 MG tablet Take 1 tablet (40 mg total) by mouth 2 (two) times daily. 60 tablet 11   pioglitazone (ACTOS) 30 MG tablet TAKE 1 TABLET BY MOUTH EVERY DAY. STRENGTH:  30 MG 90 tablet 1   sildenafil (VIAGRA) 100 MG tablet Take 0.5-1 tablets (50-100 mg total) by mouth daily as needed for erectile dysfunction. 5 tablet 11   vedolizumab (ENTYVIO) 300 MG injection Inject 300 mg as directed See admin instructions. Every 2 months     apixaban (ELIQUIS) 5 MG TABS tablet Take 1 tablet (5 mg total) by mouth 2 (two) times daily. 180 tablet 3   atorvastatin (LIPITOR) 80 MG tablet Take 1 tablet (80 mg total) by mouth daily. 90 tablet 3   carvedilol (COREG) 12.5 MG tablet Take 1 tablet (12.5 mg total) by mouth 2 (two) times daily with a meal. 180 tablet 3   diltiazem (CARDIZEM) 30 MG tablet Take 1 tablet (30 mg total) by mouth 3 (three) times daily as needed (palpitations). 90 tablet 3   empagliflozin (JARDIANCE) 10 MG TABS tablet Take 1 tablet (10 mg total) by mouth daily. 90 tablet 3   lisinopril (ZESTRIL) 20 MG tablet Take 1 tablet (20 mg total) by mouth daily. 90 tablet 3   No current facility-administered medications for this visit.     Review of Systems    He denies chest pain, palpitations, pnd, orthopnea, n, v, dizziness, syncope, edema, weight gain, or early satiety. All other systems reviewed and are otherwise negative except as noted above.   Physical  Exam    VS:  BP 118/70   Pulse 67   Ht 5\' 10"  (1.778 m)   Wt 206 lb 12.8 oz (93.8 kg)   SpO2 95%   BMI 29.67 kg/m   GEN: Well nourished, well developed, in no acute distress. HEENT: normal. Neck: Supple, no JVD, carotid bruits, or masses. Cardiac: RRR, no murmurs, rubs, or gallops. No clubbing, cyanosis, edema.  Radials/DP/PT 2+ and equal bilaterally.  Respiratory:  Respirations regular and unlabored, clear to auscultation bilaterally. GI: Soft, nontender, nondistended, BS + x 4. MS: no deformity or atrophy. Skin: warm and dry, no rash. Neuro:  Strength and sensation are intact. Psych: Normal affect.  Accessory Clinical Findings    ECG personally reviewed by me today -NSR, 67 bpm- no acute changes.   Lab Results  Component Value Date   WBC 4.6 10/18/2022   HGB 13.7 10/18/2022   HCT 44.2 10/18/2022   MCV 80.8 10/18/2022   PLT 205 10/18/2022   Lab Results  Component Value Date   CREATININE 1.49 (H) 10/18/2022   BUN 22 10/18/2022   NA 136 10/18/2022   K 4.1 10/18/2022   CL 104 10/18/2022   CO2 23 10/18/2022   Lab Results  Component Value Date   ALT 26 10/18/2022   AST 42 (H) 10/18/2022   ALKPHOS 102 10/18/2022   BILITOT 0.7 10/18/2022   Lab Results  Component Value Date   CHOL 110 05/27/2022   HDL 36 (L) 05/27/2022   LDLCALC 49 05/27/2022   TRIG 175 (H) 05/27/2022   CHOLHDL 3.1 05/27/2022    Lab Results  Component Value Date   HGBA1C 6.8 (H) 05/27/2022    Assessment & Plan   1. Persistent atrial fibrillation: Recent hospitalization with A-fib RVR.  Failed Tikosyn load and was started on amiodarone.  Maintaining NSR. Discussed continued monitoring with [employer]ia[employer] device.  He has an appointment scheduled with Joesph July, [place] 22 2024.  Discussed with Joesph July, PA, who agrees patient okay to move appointment and follow-up in 1 month.  Continue amiodarone, carvedilol, as needed diltiazem, and Eliquis.   2. CAD: Cath in June 2022 revealed  high-grade  ostial diagonal 1 disease.  He returned in July 2022 for staged PCI however, this was complicated by coronary perforation and procedure was aborted.  He has been managed medically.  3. PAD: S/p orbital rotational atherectomy and drug-eluting balloon angioplasty of distal left SFA in November 2016, followed by another orbital rotational atherectomy and balloon angioplasty for high-grade subocclusive right popliteal plaque in December 2016.  Denies worsening claudication.  Monitored per [name].  4. Aortic stenosis: Repeat echo scheduled for 03/2023.   5. Thoracic aortic aneurysm: Will continue to monitor with routine repeat echo as above.  6. Hypertension: BP well controlled. Continue current antihypertensive regimen.   7. Hyperlipidemia: LDL was 49 in 05/2022.  Continue Lipitor.  8. CKD stage III: Creatinine was 1.49 on 10/18/2022.  9. Type 2 diabetes: A1c was 6.8 in 05/2019.  Monitored and managed per PCP.  10. Hepatocellular carcinoma: Following with oncology.  11. Disposition: Follow-up in 1 months with EP APP, 6 months with [name].      Lenna Sciara, NP 10/30/2022, 2:20 PM

## 2022-10-30 NOTE — Patient Instructions (Addendum)
Medication Instructions:  Your physician recommends that you continue on your current medications as directed. Please refer to the Current Medication list given to you today.   *If you need a refill on your cardiac medications before your next appointment, please call your pharmacy*   Lab Work: NONE ordered at this time of appointment   If you have labs (blood work) drawn today and your tests are completely normal, you will receive your results only by: [place] (if you have MyChart) OR A paper copy in the mail If you have any lab test that is abnormal or we need to change your treatment, we will call you to review the results.   Testing/Procedures: NONE ordered at this time of appointment     Follow-Up: At [facility], you and your health needs are our priority.  As part of our continuing mission to provide you with exceptional heart care, we have created designated Provider Care Teams.  These Care Teams include your primary Cardiologist (physician) and Advanced Practice Providers (APPs -  Physician Assistants and Nurse Practitioners) who all work together to provide you with the care you need, when you need it.  We recommend signing up for the patient portal called "MyChart".  Sign up information is provided on this After Visit Summary.  MyChart is used to connect with patients for Virtual Visits (Telemedicine).  Patients are able to view lab/test results, encounter notes, upcoming appointments, etc.  Non-urgent messages can be sent to your provider as well.   To learn more about what you can do with MyChart, go to [email or web address].    Your next appointment:   6-7  month(s) ([name]) 1 month [facility] PA) Provider:   Quay Burow, MD     Other Instructions Evalee Mutton divice discussed

## 2022-10-31 ENCOUNTER — Inpatient Hospital Stay (HOSPITAL_BASED_OUTPATIENT_CLINIC_OR_DEPARTMENT_OTHER): Payer: PPO | Admitting: Nurse Practitioner

## 2022-10-31 ENCOUNTER — Encounter: Payer: Self-pay | Admitting: Nurse Practitioner

## 2022-10-31 ENCOUNTER — Inpatient Hospital Stay: Payer: PPO

## 2022-10-31 VITALS — BP 143/82 | HR 60 | Temp 98.1°F | Resp 20 | Ht 70.0 in | Wt 208.0 lb

## 2022-10-31 DIAGNOSIS — C220 Liver cell carcinoma: Secondary | ICD-10-CM | POA: Diagnosis not present

## 2022-10-31 LAB — CBC WITH DIFFERENTIAL (CANCER CENTER ONLY)
Abs Immature Granulocytes: 0 10*3/uL (ref 0.00–0.07)
Basophils Absolute: 0.1 10*3/uL (ref 0.0–0.1)
Basophils Relative: 1 %
Eosinophils Absolute: 0.2 10*3/uL (ref 0.0–0.5)
Eosinophils Relative: 6 %
HCT: 45.1 % (ref 39.0–52.0)
Hemoglobin: 14 g/dL (ref 13.0–17.0)
Immature Granulocytes: 0 %
Lymphocytes Relative: 21 %
Lymphs Abs: 0.9 10*3/uL (ref 0.7–4.0)
MCH: 25.3 pg — ABNORMAL LOW (ref 26.0–34.0)
MCHC: 31 g/dL (ref 30.0–36.0)
MCV: 81.6 fL (ref 80.0–100.0)
Monocytes Absolute: 0.5 10*3/uL (ref 0.1–1.0)
Monocytes Relative: 12 %
Neutro Abs: 2.6 10*3/uL (ref 1.7–7.7)
Neutrophils Relative %: 60 %
Platelet Count: 114 10*3/uL — ABNORMAL LOW (ref 150–400)
RBC: 5.53 MIL/uL (ref 4.22–5.81)
RDW: 23.3 % — ABNORMAL HIGH (ref 11.5–15.5)
WBC Count: 4.4 10*3/uL (ref 4.0–10.5)
nRBC: 0 % (ref 0.0–0.2)

## 2022-10-31 LAB — CMP (CANCER CENTER ONLY)
ALT: 35 U/L (ref 0–44)
AST: 50 U/L — ABNORMAL HIGH (ref 15–41)
Albumin: 3.5 g/dL (ref 3.5–5.0)
Alkaline Phosphatase: 93 U/L (ref 38–126)
Anion gap: 6 (ref 5–15)
BUN: 21 mg/dL (ref 8–23)
CO2: 26 mmol/L (ref 22–32)
Calcium: 8.7 mg/dL — ABNORMAL LOW (ref 8.9–10.3)
Chloride: 107 mmol/L (ref 98–111)
Creatinine: 1.51 mg/dL — ABNORMAL HIGH (ref 0.61–1.24)
GFR, Estimated: 50 mL/min — ABNORMAL LOW (ref >60)
Glucose, Bld: 178 mg/dL — ABNORMAL HIGH (ref 70–99)
Potassium: 3.8 mmol/L (ref 3.5–5.1)
Sodium: 139 mmol/L (ref 135–145)
Total Bilirubin: 0.9 mg/dL (ref 0.3–1.2)
Total Protein: 6.6 g/dL (ref 6.5–8.1)

## 2022-10-31 NOTE — Progress Notes (Signed)
[facility] OFFICE PROGRESS NOTE   Diagnosis: Hepatocellular carcinoma  INTERVAL HISTORY:   Mr. Welz returns as scheduled.  He began cabozantinib 10/11/2022.  He denies nausea/vomiting.  No mouth sores.  No change in baseline bowel habits, multiple loose stools a day.  No rash.  No hand or foot pain or redness.  No chest pain.  No shortness of breath.  He has a good appetite.  No consistent abdominal pain.  Objective:  Vital signs in last 24 hours:  Blood pressure (!) 143/82, pulse 60, temperature 98.1 F (36.7 C), resp. rate 20, height 5\' 10"  (1.778 m), weight 208 lb (94.3 kg), SpO2 98 %.    HEENT: No thrush or ulcers. Resp: Lungs clear bilaterally. Cardio: Regular rate and rhythm. GI: Abdomen soft and nontender.  No hepatosplenomegaly. Vascular: No leg edema. Neuro: Alert and oriented. Skin: Palms without erythema.  No rash.  A few ecchymoses on the forearms.   Lab Results:  Lab Results  Component Value Date   WBC 4.4 10/31/2022   HGB 14.0 10/31/2022   HCT 45.1 10/31/2022   MCV 81.6 10/31/2022   PLT 114 (L) 10/31/2022   NEUTROABS 2.6 10/31/2022    Imaging:  No results found.  Medications: I have reviewed the patient's current medications.  Assessment/Plan: Hepatocellular carcinoma CT angio chest aorta 01/30/2021-no evidence of thoracic aortic aneurysm, diffuse hepatic steatosis, suggestion of early cirrhosis, 7.1 cm enhancing masslike area in segment 5/6, prominent gastrohepatic and periportal nodes measuring up to 7 mm MRI liver 03/22/2021-arterial hyperenhancing subcapsular mass, segment 6, 7 x 5.1 cm, LI-RADS 5, no pathologically large lymph nodes Ultrasound-guided biopsy 04/24/2021-hepatocellular carcinoma Y90 radioembolization of segment 6 and segment 7 hepatic arterial branches 06/21/2021 MRI abdomen 09/03/2021-new increased enhancement inferior and superior to the dominant right liver lesion, new 11 mm enhancing lesion in the left liver LIRADS 3,  dominant right hepatic lesion stable in size MRI abdomen 12/19/2021-unchanged hyperenhancing mass in the anterior inferior right liver measuring 7.8 x 5 cm, interval enlargement of a arterial enhancing lesion in segment 2- LIRADS 5, new small enhancing lesions hepatic segment 4A measuring 1.2 x 0.9 cm and 0.8 x 0.6 cm- LIRADS 5 Cycle 1 atezolizumab /bevacizumab 02/07/2022 Cycle 2 atezolizumab/bevacizumab 02/27/2022 Cycle 3 atezolizumab/bevacizumab 03/21/2022 Cycle 4 atezolizumab/bevacizumab 04/22/2022 MRI abdomen 05/05/2022-no change in dominant segment 6 lesion, no change in hyperenhancing lesions in segment 4A and segment 2, no new lesions Cycle 5 atezolizumab/bevacizumab 05/13/2022 Cycle 6 atezolizumab/bevacizumab 06/04/2022 Cycle 7 atezolizumab/bevacizumab 06/24/2022 Cycle 8 atezolizumab/bevacizumab 07/15/2022 MRI abdomen 07/20/2022-mild progression of bilateral liver lesions, no evidence of extrahepatic metastatic disease MRI abdomen [facility] 08/19/2022-increased size of the dominant admitted 5/6 lesion, now measuring 12.7 x 8.8 cm, multiple additional hepatic lesions appear larger than on the previous exam, stable mildly enlarged upper abdominal lymph nodes Lenvatinib 09/09/2022, stopped 10/04/2022 Cabozantinib 10/11/2022 Ulcerative colitis-maintained on vedolizumab Peripheral arterial disease History of a thoracic aortic aneurysm CAD Diabetes Hypertension Chronic renal failure Paroxysmal atrial fibrillation    Disposition: Mr. Dumont appears stable.  He began cabozantinib 10/11/2022.  He seems to be tolerating well.  Plan to continue the same.  CBC reviewed.  Counts adequate to continue cabozantinib.  Mild thrombocytopenia.  He will contact the office with bleeding.  He continues follow-up of atrial fibrillation with cardiology.  Mr. Guilbault will return for lab and follow-up in 2 to 3 weeks.  We are available to see him sooner if needed.   Ned Card ANP/GNP-BC   10/31/2022  9:02 AM

## 2022-11-01 ENCOUNTER — Other Ambulatory Visit: Payer: Self-pay

## 2022-11-01 ENCOUNTER — Ambulatory Visit: Payer: PPO | Admitting: Student

## 2022-11-05 ENCOUNTER — Ambulatory Visit (INDEPENDENT_AMBULATORY_CARE_PROVIDER_SITE_OTHER): Payer: PPO | Admitting: Family Medicine

## 2022-11-05 VITALS — BP 158/98 | HR 65 | Temp 97.7°F | Ht 70.0 in | Wt 205.0 lb

## 2022-11-05 DIAGNOSIS — E1169 Type 2 diabetes mellitus with other specified complication: Secondary | ICD-10-CM | POA: Diagnosis not present

## 2022-11-05 DIAGNOSIS — I10 Essential (primary) hypertension: Secondary | ICD-10-CM

## 2022-11-05 MED ORDER — SITAGLIPTIN PHOSPHATE 100 MG PO TABS: 100.0000 mg | ORAL_TABLET | Freq: Every day | ORAL | 3 refills | Status: DC

## 2022-11-05 NOTE — Progress Notes (Signed)
Acute Office Visit  Subjective:     Patient ID: Matthew Chen, male    DOB: Jul 28, 1956, 67 y.o.   MRN: [id number]  Chief Complaint  Patient presents with   Follow-up     trulicity f/u (taken off during recent hospital stay) - sx have returned. Lower part of intestines very irritated and uncomfortable - JBG\\\     HPI Patient is in today for side effects including gas and diarrhea since restarting Trulicity a couple months ago. He was started on 3mg  due to shortage, he also had to stop taking Trulicity when he was hospitalized last month. He restarted at 3mg  1 week after discharge. He reports excellent compliance. He was on Victoza prior to that. He checks FBG a few times weekly and reports values 110-95.  Review of Systems  All other systems reviewed and are negative.   Past Medical History:  Diagnosis Date   Adenomatous colon polyp 03/2008   Anginal pain ([place])    Aortic aneurysm, thoracic ([place]) 09/09/2016   4.4 cm by echo 05/2015   Arthritis    "joints in my fingers ache" (07/13/2015)   Bicuspid aortic valve 09/09/2016   Cataract 2019   bilateral eyes   CKD (chronic kidney disease), stage III ([place])    Coronary artery disease    Diabetes mellitus without complication ([place])    Hemorrhoids    Hepatocellular carcinoma ([place])    Hyperlipidemia    Hypertension    Paroxysmal atrial fibrillation ([place])    Peripheral arterial disease ([place])    a. dopppler 05/29/2015 revealed high-grade right popliteal and distal left SFA disease b. LE angio 06/28/2015 revealing high grade calcific dx with distal L SFA and R popliteal artery s/p diamondback orbital rotational atherectomy, PTA of L SFA  c. 07/13/2015 diamondback orbital rotational atherectomy and drug eluting balloon angioplasty of R popliteal artery (P2 segment) using distal protection    Type II diabetes mellitus ([place])    Ulcerative colitis    Past Surgical History:  Procedure Laterality Date   ABDOMINAL AORTOGRAM W/LOWER EXTREMITY  N/A 04/27/2018   Procedure: ABDOMINAL AORTOGRAM W/LOWER EXTREMITY;  Surgeon: Lorretta Harp, MD;  Location: [facility];  Service: Cardiovascular;  Laterality: N/A;   COLONOSCOPY     COLONOSCOPY W/ POLYPECTOMY  X 4-5   CORONARY ATHERECTOMY N/A 02/15/2021   Procedure: CORONARY ATHERECTOMY;  Surgeon: Lorretta Harp, MD;  Location: [facility];  Service: Cardiovascular;  Laterality: N/A;  aborted    CORONARY BALLOON ANGIOPLASTY N/A 02/15/2021   Procedure: CORONARY BALLOON ANGIOPLASTY;  Surgeon: Lorretta Harp, MD;  Location: [facility];  Service: Cardiovascular;  Laterality: N/A;   COSMETIC SURGERY  < 2000   "removed birthmark from top of my head"   IR ANGIOGRAM SELECTIVE EACH ADDITIONAL VESSEL  06/08/2021   IR ANGIOGRAM SELECTIVE EACH ADDITIONAL VESSEL  06/08/2021   IR ANGIOGRAM SELECTIVE EACH ADDITIONAL VESSEL  06/08/2021   IR ANGIOGRAM SELECTIVE EACH ADDITIONAL VESSEL  06/08/2021   IR ANGIOGRAM SELECTIVE EACH ADDITIONAL VESSEL  06/21/2021   IR ANGIOGRAM SELECTIVE EACH ADDITIONAL VESSEL  06/21/2021   IR ANGIOGRAM SELECTIVE EACH ADDITIONAL VESSEL  06/21/2021   IR ANGIOGRAM SELECTIVE EACH ADDITIONAL VESSEL  06/21/2021   IR ANGIOGRAM VISCERAL SELECTIVE  06/08/2021   IR ANGIOGRAM VISCERAL SELECTIVE  06/21/2021   IR EMBO ARTERIAL NOT HEMORR HEMANG INC GUIDE ROADMAPPING  06/08/2021   IR EMBO TUMOR ORGAN ISCHEMIA INFARCT INC GUIDE ROADMAPPING  06/21/2021   IR  EMBO TUMOR ORGAN ISCHEMIA INFARCT INC GUIDE ROADMAPPING  06/21/2021   IR RADIOLOGIST EVAL & MGMT  05/08/2021   IR RADIOLOGIST EVAL & MGMT  07/26/2021   IR RADIOLOGIST EVAL & MGMT  09/05/2021   IR RADIOLOGIST EVAL & MGMT  12/27/2021   IR US GUIDE VASC ACCESS LEFT  06/21/2021   IR US GUIDE VASC ACCESS RIGHT  06/08/2021   LEFT HEART CATH AND CORONARY ANGIOGRAPHY N/A 02/08/2021   Procedure: LEFT HEART CATH AND CORONARY ANGIOGRAPHY;  Surgeon: Lorretta Harp, MD;  Location: [facility];  Service:  Cardiovascular;  Laterality: N/A;   PERIPHERAL VASCULAR ATHERECTOMY  04/27/2018   Procedure: PERIPHERAL VASCULAR ATHERECTOMY;  Surgeon: Lorretta Harp, MD;  Location: [facility];  Service: Cardiovascular;;  right popliteal artery   PERIPHERAL VASCULAR CATHETERIZATION N/A 06/29/2015   Procedure: Lower Extremity Angiography;  Surgeon: Lorretta Harp, MD;  Location: [facility];  Service: Cardiovascular;  Laterality: N/A;   PERIPHERAL VASCULAR CATHETERIZATION N/A 07/13/2015   Procedure: Lower Extremity Angiography;  Surgeon: Lorretta Harp, MD;  Location: [facility];  Service: Cardiovascular;  Laterality: N/A;   PERIPHERAL VASCULAR CATHETERIZATION  07/13/2015   Procedure: Peripheral Vascular Atherectomy;  Surgeon: Lorretta Harp, MD;  Location: [facility];  Service: Cardiovascular;;   PERIPHERAL VASCULAR INTERVENTION  04/27/2018   Procedure: PERIPHERAL VASCULAR INTERVENTION;  Surgeon: Lorretta Harp, MD;  Location: [facility];  Service: Cardiovascular;;  right popliteal artery   POLYPECTOMY     TONSILLECTOMY     UPPER GASTROINTESTINAL ENDOSCOPY     Current Outpatient Medications on File Prior to Visit  Medication Sig Dispense Refill   acetaminophen (TYLENOL) 500 MG tablet Take 1 tablet (500 mg total) by mouth every 6 (six) hours as needed. 30 tablet 0   amiodarone (PACERONE) 200 MG tablet Take 1 tablet (200 mg total) by mouth 2 (two) times daily. 180 tablet 3   apixaban (ELIQUIS) 5 MG TABS tablet Take 1 tablet (5 mg total) by mouth 2 (two) times daily. 180 tablet 3   aspirin EC 81 MG tablet Take 1 tablet (81 mg total) by mouth daily. 90 tablet 3   atorvastatin (LIPITOR) 80 MG tablet Take 1 tablet (80 mg total) by mouth daily. 90 tablet 3   cabozantinib (CABOMETYX) 60 MG tablet Take 1 tablet (60 mg total) by mouth daily. Take on an empty stomach, 1 hour before or 2 hours after meals. 30 tablet 0   carvedilol (COREG) 12.5 MG tablet Take 1 tablet  (12.5 mg total) by mouth 2 (two) times daily with a meal. 180 tablet 3   cyanocobalamin (VITAMIN B12) 1000 MCG tablet Take 1,000 mcg by mouth daily.     diltiazem (CARDIZEM) 30 MG tablet Take 1 tablet (30 mg total) by mouth 3 (three) times daily as needed (palpitations). 90 tablet 3   empagliflozin (JARDIANCE) 10 MG TABS tablet Take 1 tablet (10 mg total) by mouth daily. 90 tablet 3   fenofibrate 160 MG tablet Take 1 tablet (160 mg total) by mouth daily. 90 tablet 3   glucose blood (ONETOUCH ULTRA) test strip CHECK BLOOD SUGAR TWICE A DAY 50 strip 15   Insulin Pen Needle (B-D UF III MINI PEN NEEDLES) 31G X 5 MM MISC Use daily with Victoza 100 each 11   lisinopril (ZESTRIL) 20 MG tablet Take 1 tablet (20 mg total) by mouth daily. 90 tablet 3   Omega-3 Fatty Acids (FISH OIL)  1000 MG CAPS Take 1,000 mg by mouth 2 (two) times daily.     pantoprazole (PROTONIX) 40 MG tablet Take 1 tablet (40 mg total) by mouth 2 (two) times daily. 60 tablet 11   pioglitazone (ACTOS) 30 MG tablet TAKE 1 TABLET BY MOUTH EVERY DAY. STRENGTH: 30 MG 90 tablet 1   sildenafil (VIAGRA) 100 MG tablet Take 0.5-1 tablets (50-100 mg total) by mouth daily as needed for erectile dysfunction. 5 tablet 11   vedolizumab (ENTYVIO) 300 MG injection Inject 300 mg as directed See admin instructions. Every 2 months     No current facility-administered medications on file prior to visit.   Allergies  Allergen Reactions   Penicillins Other (See Comments)    Unsure, childhood reaction Has patient had a PCN reaction causing immediate rash, facial/tongue/throat swelling, SOB or lightheadedness with hypotension: Unknown Has patient had a PCN reaction causing severe rash involving mucus membranes or skin necrosis: Unknown Has patient had a PCN reaction that required hospitalization: No Has patient had a PCN reaction occurring within the last 10 years: No If all of the above answers are "NO", then may proceed with Cephalosporin use.         Objective:    BP (!) 158/98   Pulse 65   Temp 97.7 F (36.5 C) (Oral)   Ht 5\' 10"  (1.778 m)   Wt 205 lb (93 kg)   SpO2 97%   BMI 29.41 kg/m  BP Readings from Last 3 Encounters:  11/05/22 (!) 158/98  10/31/22 (!) 143/82  10/30/22 118/70      Physical Exam Vitals and nursing note reviewed.  Constitutional:      Appearance: Normal appearance. He is normal weight.  HENT:     Head: Normocephalic and atraumatic.  Cardiovascular:     Rate and Rhythm: Normal rate and regular rhythm.     Pulses: Normal pulses.     Heart sounds: Normal heart sounds.  Pulmonary:     Effort: Pulmonary effort is normal.     Breath sounds: Normal breath sounds.  Skin:    General: Skin is warm and dry.     Capillary Refill: Capillary refill takes less than 2 seconds.  Neurological:     General: No focal deficit present.     Mental Status: He is alert and oriented to person, place, and time. Mental status is at baseline.  Psychiatric:        Mood and Affect: Mood normal.        Behavior: Behavior normal.        Thought Content: Thought content normal.        Judgment: Judgment normal.     No results found for any visits on 11/05/22.      Assessment & Plan:   Problem List Items Addressed This Visit       Cardiovascular and Mediastinum   Hypertension    BP elevated in office today 158/98. He is currently taking Coreg 12.5mg  BID, Lisinopril 20mg  daily, and Diltiazem 30mg  TID. He does have an upcoming appointment with cardiology. Encouraged to follow up sooner for BP management. Seek medical care for chest pain, dyspnea with exertion, palpitations, recurrent headaches, or vision changes.        Endocrine   Type 2 diabetes mellitus with other specified complication ([place]) - Primary    Last A1c 6.8%. Here today for medication management. He is not tolerating Trulicity due to GI side effects. We discussed option of lowering his dose but he  would like to try another medication. Will start  Januvia 100mg  daily. A1c and uACR done today. Will follow up in 6 months or sooner if unable to tolerate. Encouraged to continue daily FBG and report to office values <85.      Relevant Medications   sitaGLIPtin (JANUVIA) 100 MG tablet   Other Relevant Orders   Microalbumin / creatinine urine ratio   Hemoglobin A1c    Meds ordered this encounter  Medications   sitaGLIPtin (JANUVIA) 100 MG tablet    Sig: Take 1 tablet (100 mg total) by mouth daily.    Dispense:  90 tablet    Refill:  3    Order Specific Question:   Supervising Provider    Answer:   Jenna Luo T [id number]    Return in about 6 months (around 05/08/2023) for diabetes.  Rubie Maid, FNP

## 2022-11-05 NOTE — Assessment & Plan Note (Signed)
BP elevated in office today 158/98. He is currently taking Coreg 12.5mg  BID, Lisinopril 20mg  daily, and Diltiazem 30mg  TID. He does have an upcoming appointment with cardiology. Encouraged to follow up sooner for BP management. Seek medical care for chest pain, dyspnea with exertion, palpitations, recurrent headaches, or vision changes.

## 2022-11-05 NOTE — Assessment & Plan Note (Addendum)
Last A1c 6.8%. Here today for medication management. He is not tolerating Trulicity due to GI side effects. We discussed option of lowering his dose but he would like to try another medication. Will start Januvia 100mg  daily. A1c and uACR done today. Will follow up in 6 months or sooner if unable to tolerate. Encouraged to continue daily FBG and report to office values <85.

## 2022-11-06 ENCOUNTER — Other Ambulatory Visit (HOSPITAL_COMMUNITY): Payer: Self-pay

## 2022-11-06 LAB — MICROALBUMIN / CREATININE URINE RATIO
Creatinine, Urine: 146 mg/dL (ref 20–320)
Microalb Creat Ratio: 61 mg/g creat — ABNORMAL HIGH (ref <30)
Microalb, Ur: 8.9 mg/dL

## 2022-11-06 LAB — HEMOGLOBIN A1C
Hgb A1c MFr Bld: 7 % of total Hgb — ABNORMAL HIGH (ref <5.7)
Mean Plasma Glucose: 154 mg/dL
eAG (mmol/L): 8.5 mmol/L

## 2022-11-11 DIAGNOSIS — K5190 Ulcerative colitis, unspecified, without complications: Secondary | ICD-10-CM | POA: Diagnosis not present

## 2022-11-12 ENCOUNTER — Encounter: Payer: Self-pay | Admitting: Family Medicine

## 2022-11-12 ENCOUNTER — Ambulatory Visit (INDEPENDENT_AMBULATORY_CARE_PROVIDER_SITE_OTHER): Payer: PPO | Admitting: Family Medicine

## 2022-11-12 VITALS — BP 144/82 | HR 57 | Temp 97.4°F | Ht 70.0 in | Wt 208.6 lb

## 2022-11-12 DIAGNOSIS — R140 Abdominal distension (gaseous): Secondary | ICD-10-CM

## 2022-11-12 LAB — LAB REPORT - SCANNED: EGFR: 47

## 2022-11-12 MED ORDER — HYDROCHLOROTHIAZIDE 25 MG PO TABS: 25.0000 mg | ORAL_TABLET | Freq: Every day | ORAL | 3 refills | Status: DC

## 2022-11-12 NOTE — Progress Notes (Signed)
Subjective:    Patient ID: Matthew Chen, male    DOB: 1955/09/15, 67 y.o.   MRN: [id number] Patient continues to suffer from a lot of gas.  He reports increased flatus and abdominal bloating.  He recently discontinued Trulicity to see if this would help.  He has not experienced any improvement in the gas and bloating although the diarrhea has improved.  His A1c was acceptable at 7 and we replaced it with Januvia which should do an adequate job covering for the [employer].  However his blood pressure remains significantly elevated.  He denies any chest pain or shortness of breath Immunization History  Administered Date(s) Administered   Fluad Quad(high Dose 65+) 06/04/2022   Influenza Inj Mdck Quad Pf 06/14/2017   Influenza, High Dose Seasonal PF 05/22/2021   Influenza,inj,Quad PF,6+ Mos 07/22/2013, 06/12/2015, 06/10/2016, 04/21/2018, 05/03/2019   Influenza-Unspecified 06/14/2017, 04/21/2018   PNEUMOCOCCAL CONJUGATE-20 05/30/2022   Pneumococcal Polysaccharide-23 06/23/2017    Past Medical History:  Diagnosis Date   Adenomatous colon polyp 03/2008   Anginal pain ([place])    Aortic aneurysm, thoracic ([place]) 09/09/2016   4.4 cm by echo 05/2015   Arthritis    "joints in my fingers ache" (07/13/2015)   Bicuspid aortic valve 09/09/2016   Cataract 2019   bilateral eyes   CKD (chronic kidney disease), stage III ([place])    Coronary artery disease    Diabetes mellitus without complication ([place])    Hemorrhoids    Hepatocellular carcinoma ([facility])    Hyperlipidemia    Hypertension    Paroxysmal atrial fibrillation ([place])    Peripheral arterial disease ([place])    a. dopppler 05/29/2015 revealed high-grade right popliteal and distal left SFA disease b. LE angio 06/28/2015 revealing high grade calcific dx with distal L SFA and R popliteal artery s/p diamondback orbital rotational atherectomy, PTA of L SFA  c. 07/13/2015 diamondback orbital rotational atherectomy and drug eluting balloon angioplasty of R  popliteal artery (P2 segment) using distal protection    Type II diabetes mellitus ([place])    Ulcerative colitis    Past Surgical History:  Procedure Laterality Date   ABDOMINAL AORTOGRAM W/LOWER EXTREMITY N/A 04/27/2018   Procedure: ABDOMINAL AORTOGRAM W/LOWER EXTREMITY;  Surgeon: Lorretta Harp, MD;  Location: [facility];  Service: Cardiovascular;  Laterality: N/A;   COLONOSCOPY     COLONOSCOPY W/ POLYPECTOMY  X 4-5   CORONARY ATHERECTOMY N/A 02/15/2021   Procedure: CORONARY ATHERECTOMY;  Surgeon: Lorretta Harp, MD;  Location: [facility];  Service: Cardiovascular;  Laterality: N/A;  aborted    CORONARY BALLOON ANGIOPLASTY N/A 02/15/2021   Procedure: CORONARY BALLOON ANGIOPLASTY;  Surgeon: Lorretta Harp, MD;  Location: [facility];  Service: Cardiovascular;  Laterality: N/A;   COSMETIC SURGERY  < 2000   "removed birthmark from top of my head"   IR ANGIOGRAM SELECTIVE EACH ADDITIONAL VESSEL  06/08/2021   IR ANGIOGRAM SELECTIVE EACH ADDITIONAL VESSEL  06/08/2021   IR ANGIOGRAM SELECTIVE EACH ADDITIONAL VESSEL  06/08/2021   IR ANGIOGRAM SELECTIVE EACH ADDITIONAL VESSEL  06/08/2021   IR ANGIOGRAM SELECTIVE EACH ADDITIONAL VESSEL  06/21/2021   IR ANGIOGRAM SELECTIVE EACH ADDITIONAL VESSEL  06/21/2021   IR ANGIOGRAM SELECTIVE EACH ADDITIONAL VESSEL  06/21/2021   IR ANGIOGRAM SELECTIVE EACH ADDITIONAL VESSEL  06/21/2021   IR ANGIOGRAM VISCERAL SELECTIVE  06/08/2021   IR ANGIOGRAM VISCERAL SELECTIVE  06/21/2021   IR EMBO ARTERIAL NOT HEMORR HEMANG INC GUIDE ROADMAPPING  06/08/2021  IR EMBO TUMOR ORGAN ISCHEMIA INFARCT INC GUIDE ROADMAPPING  06/21/2021   IR EMBO TUMOR ORGAN ISCHEMIA INFARCT INC GUIDE ROADMAPPING  06/21/2021   IR RADIOLOGIST EVAL & MGMT  05/08/2021   IR RADIOLOGIST EVAL & MGMT  07/26/2021   IR RADIOLOGIST EVAL & MGMT  09/05/2021   IR RADIOLOGIST EVAL & MGMT  12/27/2021   IR US GUIDE VASC ACCESS LEFT  06/21/2021   IR US GUIDE VASC ACCESS RIGHT   06/08/2021   LEFT HEART CATH AND CORONARY ANGIOGRAPHY N/A 02/08/2021   Procedure: LEFT HEART CATH AND CORONARY ANGIOGRAPHY;  Surgeon: Lorretta Harp, MD;  Location: [facility];  Service: Cardiovascular;  Laterality: N/A;   PERIPHERAL VASCULAR ATHERECTOMY  04/27/2018   Procedure: PERIPHERAL VASCULAR ATHERECTOMY;  Surgeon: Lorretta Harp, MD;  Location: [facility];  Service: Cardiovascular;;  right popliteal artery   PERIPHERAL VASCULAR CATHETERIZATION N/A 06/29/2015   Procedure: Lower Extremity Angiography;  Surgeon: Lorretta Harp, MD;  Location: [facility];  Service: Cardiovascular;  Laterality: N/A;   PERIPHERAL VASCULAR CATHETERIZATION N/A 07/13/2015   Procedure: Lower Extremity Angiography;  Surgeon: Lorretta Harp, MD;  Location: [facility];  Service: Cardiovascular;  Laterality: N/A;   PERIPHERAL VASCULAR CATHETERIZATION  07/13/2015   Procedure: Peripheral Vascular Atherectomy;  Surgeon: Lorretta Harp, MD;  Location: [facility];  Service: Cardiovascular;;   PERIPHERAL VASCULAR INTERVENTION  04/27/2018   Procedure: PERIPHERAL VASCULAR INTERVENTION;  Surgeon: Lorretta Harp, MD;  Location: [facility];  Service: Cardiovascular;;  right popliteal artery   POLYPECTOMY     TONSILLECTOMY     UPPER GASTROINTESTINAL ENDOSCOPY     Current Outpatient Medications on File Prior to Visit  Medication Sig Dispense Refill   acetaminophen (TYLENOL) 500 MG tablet Take 1 tablet (500 mg total) by mouth every 6 (six) hours as needed. 30 tablet 0   amiodarone (PACERONE) 200 MG tablet Take 1 tablet (200 mg total) by mouth 2 (two) times daily. 180 tablet 3   apixaban (ELIQUIS) 5 MG TABS tablet Take 1 tablet (5 mg total) by mouth 2 (two) times daily. 180 tablet 3   aspirin EC 81 MG tablet Take 1 tablet (81 mg total) by mouth daily. 90 tablet 3   atorvastatin (LIPITOR) 80 MG tablet Take 1 tablet (80 mg total) by mouth daily. 90 tablet 3    cabozantinib (CABOMETYX) 60 MG tablet Take 1 tablet (60 mg total) by mouth daily. Take on an empty stomach, 1 hour before or 2 hours after meals. 30 tablet 0   carvedilol (COREG) 12.5 MG tablet Take 1 tablet (12.5 mg total) by mouth 2 (two) times daily with a meal. 180 tablet 3   cyanocobalamin (VITAMIN B12) 1000 MCG tablet Take 1,000 mcg by mouth daily.     diltiazem (CARDIZEM) 30 MG tablet Take 1 tablet (30 mg total) by mouth 3 (three) times daily as needed (palpitations). 90 tablet 3   empagliflozin (JARDIANCE) 10 MG TABS tablet Take 1 tablet (10 mg total) by mouth daily. 90 tablet 3   fenofibrate 160 MG tablet Take 1 tablet (160 mg total) by mouth daily. 90 tablet 3   glucose blood (ONETOUCH ULTRA) test strip CHECK BLOOD SUGAR TWICE A DAY 50 strip 15   Insulin Pen Needle (B-D UF III MINI PEN NEEDLES) 31G X 5 MM MISC Use daily with Victoza 100 each 11   lisinopril (ZESTRIL) 20 MG tablet Take 1 tablet (20 mg  total) by mouth daily. 90 tablet 3   Omega-3 Fatty Acids (FISH OIL) 1000 MG CAPS Take 1,000 mg by mouth 2 (two) times daily.     pantoprazole (PROTONIX) 40 MG tablet Take 1 tablet (40 mg total) by mouth 2 (two) times daily. 60 tablet 11   pioglitazone (ACTOS) 30 MG tablet TAKE 1 TABLET BY MOUTH EVERY DAY. STRENGTH: 30 MG 90 tablet 1   sildenafil (VIAGRA) 100 MG tablet Take 0.5-1 tablets (50-100 mg total) by mouth daily as needed for erectile dysfunction. 5 tablet 11   sitaGLIPtin (JANUVIA) 100 MG tablet Take 1 tablet (100 mg total) by mouth daily. 90 tablet 3   vedolizumab (ENTYVIO) 300 MG injection Inject 300 mg as directed See admin instructions. Every 2 months     No current facility-administered medications on file prior to visit.   Allergies  Allergen Reactions   Penicillins Other (See Comments)    Unsure, childhood reaction Has patient had a PCN reaction causing immediate rash, facial/tongue/throat swelling, SOB or lightheadedness with hypotension: Unknown Has patient had a PCN  reaction causing severe rash involving mucus membranes or skin necrosis: Unknown Has patient had a PCN reaction that required hospitalization: No Has patient had a PCN reaction occurring within the last 10 years: No If all of the above answers are "NO", then may proceed with Cephalosporin use.    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[employer], academic  Tobacco Use   Smoking status: Former    Packs/day: 1.50    Years: 25.00    Additional pack years: 0.00    Total pack years: 37.50    Types: Cigarettes    Quit date: 08/12/1996    Years since quitting: 26.2   Smokeless tobacco: Never   Tobacco comments:    Former smoker 09/17/2022  Vaping Use   Vaping Use: Never used  Substance and Sexual Activity   Alcohol use: Yes    Alcohol/week: 1.0 standard drink of alcohol    Types: 1 Cans of beer per week    Comment: social use   Drug use: No   Sexual activity: Yes  Other Topics Concern   Not on file  Social History Narrative   ** Merged History Encounter **       Social Determinants of Health   Financial Resource Strain: Low Risk  (08/17/2021)   Overall Financial Resource Strain (CARDIA)    Difficulty of Paying Living Expenses: Not very hard  Food Insecurity: No Food Insecurity (10/06/2022)   Hunger Vital Sign    Worried About Running Out of Food in the Last Year: Never true    Ran Out of Food in the Last Year: Never true  Transportation Needs: No Transportation Needs (10/06/2022)   PRAPARE - Hydrologist (Medical): No    Lack of Transportation (Non-Medical): No  Physical Activity: Not on file  Stress: Not on file  Social Connections: Not on file  Intimate Partner Violence: Not At Risk (10/06/2022)   Humiliation, Afraid, Rape,  and Kick questionnaire    Fear of Current or Ex-Partner: No    Emotionally Abused: No    Physically Abused: No    Sexually Abused: No  Review of Systems  All other systems reviewed and are negative.      Objective:   Physical Exam Vitals reviewed.  Constitutional:      Appearance: He is well-developed.  Neck:     Thyroid: No thyromegaly.     Vascular: No JVD.  Cardiovascular:     Rate and Rhythm: Normal rate. Rhythm irregular.     Heart sounds: Murmur heard.  Pulmonary:     Effort: Pulmonary effort is normal. No respiratory distress.     Breath sounds: Normal breath sounds. No wheezing or rales.  Abdominal:     General: Bowel sounds are normal. There is no distension.     Palpations: Abdomen is soft.     Tenderness: There is no abdominal tenderness. There is no guarding or rebound.  Skin:    General: Skin is warm.     Findings: No rash.           Assessment & Plan:  Bloating At this point I recommended symptomatic treatment for the bloating including Gas-X as well as a probiotic.  If this does not help we could try empiric therapy for small intestine bacterial overgrowth syndrome.  However I would like to see how he responds to the Gas-X and the probiotic.  He will try each of these for approximately 1 week and see if the symptoms improve.  Add hydrochlorothiazide 25 mg daily and recheck blood pressure and creatinine in 2 weeks

## 2022-11-13 DIAGNOSIS — E785 Hyperlipidemia, unspecified: Secondary | ICD-10-CM | POA: Diagnosis not present

## 2022-11-13 DIAGNOSIS — Z79899 Other long term (current) drug therapy: Secondary | ICD-10-CM | POA: Diagnosis not present

## 2022-11-13 DIAGNOSIS — I10 Essential (primary) hypertension: Secondary | ICD-10-CM | POA: Diagnosis not present

## 2022-11-13 DIAGNOSIS — K5190 Ulcerative colitis, unspecified, without complications: Secondary | ICD-10-CM | POA: Diagnosis not present

## 2022-11-13 DIAGNOSIS — N289 Disorder of kidney and ureter, unspecified: Secondary | ICD-10-CM | POA: Diagnosis not present

## 2022-11-18 ENCOUNTER — Inpatient Hospital Stay: Payer: PPO | Admitting: Oncology

## 2022-11-18 ENCOUNTER — Inpatient Hospital Stay: Payer: PPO | Attending: Oncology

## 2022-11-18 VITALS — BP 104/73 | HR 72 | Temp 98.1°F | Resp 18 | Ht 70.0 in | Wt 201.8 lb

## 2022-11-18 DIAGNOSIS — R5381 Other malaise: Secondary | ICD-10-CM | POA: Insufficient documentation

## 2022-11-18 DIAGNOSIS — C220 Liver cell carcinoma: Secondary | ICD-10-CM

## 2022-11-18 DIAGNOSIS — I2510 Atherosclerotic heart disease of native coronary artery without angina pectoris: Secondary | ICD-10-CM | POA: Insufficient documentation

## 2022-11-18 DIAGNOSIS — I739 Peripheral vascular disease, unspecified: Secondary | ICD-10-CM | POA: Insufficient documentation

## 2022-11-18 DIAGNOSIS — N189 Chronic kidney disease, unspecified: Secondary | ICD-10-CM | POA: Insufficient documentation

## 2022-11-18 DIAGNOSIS — I129 Hypertensive chronic kidney disease with stage 1 through stage 4 chronic kidney disease, or unspecified chronic kidney disease: Secondary | ICD-10-CM | POA: Diagnosis not present

## 2022-11-18 DIAGNOSIS — K5190 Ulcerative colitis, unspecified, without complications: Secondary | ICD-10-CM | POA: Insufficient documentation

## 2022-11-18 DIAGNOSIS — Z79899 Other long term (current) drug therapy: Secondary | ICD-10-CM | POA: Insufficient documentation

## 2022-11-18 DIAGNOSIS — R630 Anorexia: Secondary | ICD-10-CM | POA: Insufficient documentation

## 2022-11-18 DIAGNOSIS — E1122 Type 2 diabetes mellitus with diabetic chronic kidney disease: Secondary | ICD-10-CM | POA: Insufficient documentation

## 2022-11-18 DIAGNOSIS — I480 Paroxysmal atrial fibrillation: Secondary | ICD-10-CM | POA: Diagnosis not present

## 2022-11-18 LAB — CMP (CANCER CENTER ONLY)
ALT: 49 U/L — ABNORMAL HIGH (ref 0–44)
AST: 56 U/L — ABNORMAL HIGH (ref 15–41)
Albumin: 3.7 g/dL (ref 3.5–5.0)
Alkaline Phosphatase: 120 U/L (ref 38–126)
Anion gap: 9 (ref 5–15)
BUN: 39 mg/dL — ABNORMAL HIGH (ref 8–23)
CO2: 27 mmol/L (ref 22–32)
Calcium: 9.5 mg/dL (ref 8.9–10.3)
Chloride: 99 mmol/L (ref 98–111)
Creatinine: 2.14 mg/dL — ABNORMAL HIGH (ref 0.61–1.24)
GFR, Estimated: 33 mL/min — ABNORMAL LOW (ref >60)
Glucose, Bld: 217 mg/dL — ABNORMAL HIGH (ref 70–99)
Potassium: 4.5 mmol/L (ref 3.5–5.1)
Sodium: 135 mmol/L (ref 135–145)
Total Bilirubin: 1 mg/dL (ref 0.3–1.2)
Total Protein: 7 g/dL (ref 6.5–8.1)

## 2022-11-18 LAB — CBC WITH DIFFERENTIAL (CANCER CENTER ONLY)
Abs Immature Granulocytes: 0.01 10*3/uL (ref 0.00–0.07)
Basophils Absolute: 0.1 10*3/uL (ref 0.0–0.1)
Basophils Relative: 1 %
Eosinophils Absolute: 0.2 10*3/uL (ref 0.0–0.5)
Eosinophils Relative: 3 %
HCT: 52.1 % — ABNORMAL HIGH (ref 39.0–52.0)
Hemoglobin: 16.5 g/dL (ref 13.0–17.0)
Immature Granulocytes: 0 %
Lymphocytes Relative: 24 %
Lymphs Abs: 1.2 10*3/uL (ref 0.7–4.0)
MCH: 26.3 pg (ref 26.0–34.0)
MCHC: 31.7 g/dL (ref 30.0–36.0)
MCV: 83 fL (ref 80.0–100.0)
Monocytes Absolute: 0.6 10*3/uL (ref 0.1–1.0)
Monocytes Relative: 11 %
Neutro Abs: 3.1 10*3/uL (ref 1.7–7.7)
Neutrophils Relative %: 61 %
Platelet Count: 157 10*3/uL (ref 150–400)
RBC: 6.28 MIL/uL — ABNORMAL HIGH (ref 4.22–5.81)
RDW: 25.7 % — ABNORMAL HIGH (ref 11.5–15.5)
WBC Count: 5.2 10*3/uL (ref 4.0–10.5)
nRBC: 0 % (ref 0.0–0.2)

## 2022-11-18 NOTE — Progress Notes (Signed)
[facility] OFFICE PROGRESS NOTE   Diagnosis: Hepatocellular carcinoma  INTERVAL HISTORY:   Matthew Chen returns as scheduled.  He continues cabozantinib.  No rash or diarrhea.  Dyspnea remains improved.  Objective:  Vital signs in last 24 hours:  Blood pressure 104/73, pulse 72, temperature 98.1 F (36.7 C), temperature source Oral, resp. rate 18, height 5\' 10"  (1.778 m), weight 201 lb 12.8 oz (91.5 kg), SpO2 98 %.    HEENT: No thrush or ulcers Resp: Lungs clear bilaterally Cardio: Regular rate and rhythm GI: No mass, no apparent ascites, no hepatosplenomegaly Vascular: No leg edema   Lab Results:  Lab Results  Component Value Date   WBC 5.2 11/18/2022   HGB 16.5 11/18/2022   HCT 52.1 (H) 11/18/2022   MCV 83.0 11/18/2022   PLT 157 11/18/2022   NEUTROABS 3.1 11/18/2022    CMP  Lab Results  Component Value Date   NA 135 11/18/2022   K 4.5 11/18/2022   CL 99 11/18/2022   CO2 27 11/18/2022   GLUCOSE 217 (H) 11/18/2022   BUN 39 (H) 11/18/2022   CREATININE 2.14 (H) 11/18/2022   CALCIUM 9.5 11/18/2022   PROT 7.0 11/18/2022   ALBUMIN 3.7 11/18/2022   AST 56 (H) 11/18/2022   ALT 49 (H) 11/18/2022   ALKPHOS 120 11/18/2022   BILITOT 1.0 11/18/2022   GFRNONAA 33 (L) 11/18/2022   GFRAA 49 (L) 11/20/2020    Lab Results  Component Value Date   CEA1 1.9 06/21/2021     Medications: I have reviewed the patient's current medications.   Assessment/Plan: Hepatocellular carcinoma CT angio chest aorta 01/30/2021-no evidence of thoracic aortic aneurysm, diffuse hepatic steatosis, suggestion of early cirrhosis, 7.1 cm enhancing masslike area in segment 5/6, prominent gastrohepatic and periportal nodes measuring up to 7 mm MRI liver 03/22/2021-arterial hyperenhancing subcapsular mass, segment 6, 7 x 5.1 cm, LI-RADS 5, no pathologically large lymph nodes Ultrasound-guided biopsy 04/24/2021-hepatocellular carcinoma Y90 radioembolization of segment 6 and segment 7  hepatic arterial branches 06/21/2021 MRI abdomen 09/03/2021-new increased enhancement inferior and superior to the dominant right liver lesion, new 11 mm enhancing lesion in the left liver LIRADS 3, dominant right hepatic lesion stable in size MRI abdomen 12/19/2021-unchanged hyperenhancing mass in the anterior inferior right liver measuring 7.8 x 5 cm, interval enlargement of a arterial enhancing lesion in segment 2- LIRADS 5, new small enhancing lesions hepatic segment 4A measuring 1.2 x 0.9 cm and 0.8 x 0.6 cm- LIRADS 5 Cycle 1 atezolizumab /bevacizumab 02/07/2022 Cycle 2 atezolizumab/bevacizumab 02/27/2022 Cycle 3 atezolizumab/bevacizumab 03/21/2022 Cycle 4 atezolizumab/bevacizumab 04/22/2022 MRI abdomen 05/05/2022-no change in dominant segment 6 lesion, no change in hyperenhancing lesions in segment 4A and segment 2, no new lesions Cycle 5 atezolizumab/bevacizumab 05/13/2022 Cycle 6 atezolizumab/bevacizumab 06/04/2022 Cycle 7 atezolizumab/bevacizumab 06/24/2022 Cycle 8 atezolizumab/bevacizumab 07/15/2022 MRI abdomen 07/20/2022-mild progression of bilateral liver lesions, no evidence of extrahepatic metastatic disease MRI abdomen [facility] 08/19/2022-increased size of the dominant admitted 5/6 lesion, now measuring 12.7 x 8.8 cm, multiple additional hepatic lesions appear larger than on the previous exam, stable mildly enlarged upper abdominal lymph nodes Lenvatinib 09/09/2022, stopped 10/04/2022 Cabozantinib 10/11/2022 Ulcerative colitis-maintained on vedolizumab Peripheral arterial disease History of a thoracic aortic aneurysm CAD Diabetes Hypertension Chronic renal failure Paroxysmal atrial fibrillation     Disposition: Matthew Chen has hepatocellular carcinoma.  He has been maintained on cabozantinib since 10/11/2022.  He appears to be tolerating the cabozantinib well.  He will continue cabozantinib at the current dose.  He will return  for an office and lab visit in 3 weeks. The creatinine is higher today.   This may be related to starting HCTZ last week.  We will for the chemistry panel to Dr. Tanya Nones.  Thornton Papas, MD  11/18/2022  11:29 AM

## 2022-11-27 ENCOUNTER — Other Ambulatory Visit (HOSPITAL_COMMUNITY): Payer: Self-pay

## 2022-11-29 ENCOUNTER — Other Ambulatory Visit: Payer: Self-pay | Admitting: Family Medicine

## 2022-12-03 ENCOUNTER — Other Ambulatory Visit (HOSPITAL_COMMUNITY): Payer: Self-pay

## 2022-12-03 ENCOUNTER — Other Ambulatory Visit: Payer: Self-pay | Admitting: Oncology

## 2022-12-03 DIAGNOSIS — C220 Liver cell carcinoma: Secondary | ICD-10-CM

## 2022-12-03 MED ORDER — CABOMETYX 60 MG PO TABS
60.0000 mg | ORAL_TABLET | Freq: Every day | ORAL | 0 refills | Status: DC
Start: 2022-12-03 — End: 2022-12-24
  Filled 2022-12-03: qty 30, 30d supply, fill #0

## 2022-12-04 ENCOUNTER — Other Ambulatory Visit (HOSPITAL_COMMUNITY): Payer: Self-pay

## 2022-12-05 ENCOUNTER — Other Ambulatory Visit: Payer: Self-pay

## 2022-12-05 ENCOUNTER — Other Ambulatory Visit (HOSPITAL_COMMUNITY): Payer: Self-pay

## 2022-12-06 ENCOUNTER — Other Ambulatory Visit (HOSPITAL_COMMUNITY): Payer: Self-pay

## 2022-12-09 ENCOUNTER — Ambulatory Visit: Payer: PPO | Admitting: Student

## 2022-12-09 ENCOUNTER — Inpatient Hospital Stay: Payer: PPO

## 2022-12-09 ENCOUNTER — Encounter: Payer: Self-pay | Admitting: Nurse Practitioner

## 2022-12-09 ENCOUNTER — Inpatient Hospital Stay: Payer: PPO | Admitting: Nurse Practitioner

## 2022-12-09 VITALS — BP 112/76 | HR 66 | Temp 98.1°F | Resp 18 | Ht 70.0 in | Wt 196.9 lb

## 2022-12-09 DIAGNOSIS — C220 Liver cell carcinoma: Secondary | ICD-10-CM

## 2022-12-09 LAB — CBC WITH DIFFERENTIAL (CANCER CENTER ONLY)
Abs Immature Granulocytes: 0.01 10*3/uL (ref 0.00–0.07)
Basophils Absolute: 0 10*3/uL (ref 0.0–0.1)
Basophils Relative: 1 %
Eosinophils Absolute: 0.2 10*3/uL (ref 0.0–0.5)
Eosinophils Relative: 5 %
HCT: 45.3 % (ref 39.0–52.0)
Hemoglobin: 14.7 g/dL (ref 13.0–17.0)
Immature Granulocytes: 0 %
Lymphocytes Relative: 25 %
Lymphs Abs: 1.2 10*3/uL (ref 0.7–4.0)
MCH: 27.3 pg (ref 26.0–34.0)
MCHC: 32.5 g/dL (ref 30.0–36.0)
MCV: 84.2 fL (ref 80.0–100.0)
Monocytes Absolute: 0.6 10*3/uL (ref 0.1–1.0)
Monocytes Relative: 12 %
Neutro Abs: 2.8 10*3/uL (ref 1.7–7.7)
Neutrophils Relative %: 57 %
Platelet Count: 185 10*3/uL (ref 150–400)
RBC: 5.38 MIL/uL (ref 4.22–5.81)
RDW: 27.2 % — ABNORMAL HIGH (ref 11.5–15.5)
WBC Count: 4.8 10*3/uL (ref 4.0–10.5)
nRBC: 0 % (ref 0.0–0.2)

## 2022-12-09 LAB — CMP (CANCER CENTER ONLY)
ALT: 44 U/L (ref 0–44)
AST: 54 U/L — ABNORMAL HIGH (ref 15–41)
Albumin: 3.4 g/dL — ABNORMAL LOW (ref 3.5–5.0)
Alkaline Phosphatase: 114 U/L (ref 38–126)
Anion gap: 6 (ref 5–15)
BUN: 30 mg/dL — ABNORMAL HIGH (ref 8–23)
CO2: 29 mmol/L (ref 22–32)
Calcium: 9.1 mg/dL (ref 8.9–10.3)
Chloride: 100 mmol/L (ref 98–111)
Creatinine: 1.77 mg/dL — ABNORMAL HIGH (ref 0.61–1.24)
GFR, Estimated: 42 mL/min — ABNORMAL LOW (ref >60)
Glucose, Bld: 187 mg/dL — ABNORMAL HIGH (ref 70–99)
Potassium: 4 mmol/L (ref 3.5–5.1)
Sodium: 135 mmol/L (ref 135–145)
Total Bilirubin: 1.1 mg/dL (ref 0.3–1.2)
Total Protein: 6.5 g/dL (ref 6.5–8.1)

## 2022-12-09 NOTE — Progress Notes (Signed)
  [facility] OFFICE PROGRESS NOTE   Diagnosis: Hepatocellular carcinoma  INTERVAL HISTORY:   Matthew Chen returns as scheduled.  He continues cabozantinib.  Main complaint is fatigue.  Appetite is poor.  He has lost weight.  No nausea or vomiting.  No significant diarrhea.  No rash.  Stable shortness of breath.  He denies pain.  He has some sores involving the nasal passages.  Objective:  Vital signs in last 24 hours:  Blood pressure 112/76, pulse 66, temperature 98.1 F (36.7 C), temperature source Oral, resp. rate 18, height 5\' 10"  (1.778 m), weight 196 lb 14.4 oz (89.3 kg), SpO2 98 %.    HEENT: No thrush or ulcers.  Scabbed lesions in the nose. Resp: Lungs clear bilaterally. Cardio: Regular rate and rhythm. GI: No hepatosplenomegaly. Vascular: No leg edema. Skin: Palms with mild erythema.   Lab Results:  Lab Results  Component Value Date   WBC 4.8 12/09/2022   HGB 14.7 12/09/2022   HCT 45.3 12/09/2022   MCV 84.2 12/09/2022   PLT 185 12/09/2022   NEUTROABS 2.8 12/09/2022    Imaging:  No results found.  Medications: I have reviewed the patient's current medications.  Assessment/Plan: Hepatocellular carcinoma CT angio chest aorta 01/30/2021-no evidence of thoracic aortic aneurysm, diffuse hepatic steatosis, suggestion of early cirrhosis, 7.1 cm enhancing masslike area in segment 5/6, prominent gastrohepatic and periportal nodes measuring up to 7 mm MRI liver 03/22/2021-arterial hyperenhancing subcapsular mass, segment 6, 7 x 5.1 cm, LI-RADS 5, no pathologically large lymph nodes Ultrasound-guided biopsy 04/24/2021-hepatocellular carcinoma Y90 radioembolization of segment 6 and segment 7 hepatic arterial branches 06/21/2021 MRI abdomen 09/03/2021-new increased enhancement inferior and superior to the dominant right liver lesion, new 11 mm enhancing lesion in the left liver LIRADS 3, dominant right hepatic lesion stable in size MRI abdomen 12/19/2021-unchanged  hyperenhancing mass in the anterior inferior right liver measuring 7.8 x 5 cm, interval enlargement of a arterial enhancing lesion in segment 2- LIRADS 5, new small enhancing lesions hepatic segment 4A measuring 1.2 x 0.9 cm and 0.8 x 0.6 cm- LIRADS 5 Cycle 1 atezolizumab /bevacizumab 02/07/2022 Cycle 2 atezolizumab/bevacizumab 02/27/2022 Cycle 3 atezolizumab/bevacizumab 03/21/2022 Cycle 4 atezolizumab/bevacizumab 04/22/2022 MRI abdomen 05/05/2022-no change in dominant segment 6 lesion, no change in hyperenhancing lesions in segment 4A and segment 2, no new lesions Cycle 5 atezolizumab/bevacizumab 05/13/2022 Cycle 6 atezolizumab/bevacizumab 06/04/2022 Cycle 7 atezolizumab/bevacizumab 06/24/2022 Cycle 8 atezolizumab/bevacizumab 07/15/2022 MRI abdomen 07/20/2022-mild progression of bilateral liver lesions, no evidence of extrahepatic metastatic disease MRI abdomen [facility] 08/19/2022-increased size of the dominant admitted 5/6 lesion, now measuring 12.7 x 8.8 cm, multiple additional hepatic lesions appear larger than on the previous exam, stable mildly enlarged upper abdominal lymph nodes Lenvatinib 09/09/2022, stopped 10/04/2022 Cabozantinib 10/11/2022 Ulcerative colitis-maintained on vedolizumab Peripheral arterial disease History of a thoracic aortic aneurysm CAD Diabetes Hypertension Chronic renal failure Paroxysmal atrial fibrillation  Disposition: Mr. Brew appears stable.  He began cabozantinib 10/11/2022.  Plan for restaging MRI prior to next office visit.  CBC and chemistry panel reviewed.  Labs adequate to continue cabozantinib as above.  Creatinine is stable.  He has malaise and anorexia.  We will ask the Cancer [facility] dietitian to consult.  He will return for follow-up in 3 weeks, restaging MRI a few days prior.    Lonna Cobb ANP/GNP-BC   12/09/2022  11:48 AM

## 2022-12-11 LAB — AFP TUMOR MARKER: AFP, Serum, Tumor Marker: 9.7 ng/mL — ABNORMAL HIGH (ref 0.0–8.4)

## 2022-12-16 ENCOUNTER — Telehealth: Payer: Self-pay | Admitting: *Deleted

## 2022-12-16 NOTE — Telephone Encounter (Addendum)
Matthew Chen reports rectal pain/burning with BM and discomfort in rectal area when he is sitting. Denies diarrhea or eating spicy foods. Occasional blood in stool.  Instructed him to avoid spicy foods, try OTC Preparation H or Anusol suppository/cream and baby wipes. Try sitz bath bid as well. He agrees to try this approach and will reach out to GI if symptoms do not improve. Asking if the Cabometyx can cause this and Dr. Truett Perna said it is doubtful.

## 2022-12-17 ENCOUNTER — Other Ambulatory Visit: Payer: Self-pay

## 2022-12-17 ENCOUNTER — Other Ambulatory Visit (HOSPITAL_COMMUNITY): Payer: Self-pay

## 2022-12-17 DIAGNOSIS — I10 Essential (primary) hypertension: Secondary | ICD-10-CM

## 2022-12-17 NOTE — Telephone Encounter (Signed)
Requested medications are due for refill today.  yes  Requested medications are on the active medications list.  yes  Last refill. 10/30/2022 #180 3 rf - "print"  Future visit scheduled.   no  Notes to clinic.  Rx signed by Bernadene Person.    Requested Prescriptions  Pending Prescriptions Disp Refills   carvedilol (COREG) 12.5 MG tablet 180 tablet 3    Sig: Take 1 tablet (12.5 mg total) by mouth 2 (two) times daily with a meal.     Cardiovascular: Beta Blockers 3 Failed - 12/17/2022 12:08 PM      Failed - Cr in normal range and within 360 days    Creatinine  Date Value Ref Range Status  12/09/2022 1.77 (H) 0.61 - 1.24 mg/dL Final   Creat  Date Value Ref Range Status  05/27/2022 1.77 (H) 0.70 - 1.35 mg/dL Final   Creatinine, Urine  Date Value Ref Range Status  11/05/2022 146 20 - 320 mg/dL Final         Failed - AST in normal range and within 360 days    AST  Date Value Ref Range Status  12/09/2022 54 (H) 15 - 41 U/L Final         Failed - Valid encounter within last 6 months    Recent Outpatient Visits           2 years ago Type 2 diabetes mellitus with other specified complication, unspecified whether long term insulin use (HCC)   [facility] Donita Brooks, MD   2 years ago Type 2 diabetes mellitus with other specified complication, unspecified whether long term insulin use (HCC)   [facility] Pickard, Priscille Heidelberg, MD   3 years ago Benign essential HTN   [facility] Tanya Nones, Priscille Heidelberg, MD   3 years ago Benign essential HTN   [facility] Tanya Nones, Priscille Heidelberg, MD   3 years ago Benign essential HTN   [facility] Pickard, Priscille Heidelberg, MD       Future Appointments             In 1 week Lanna Poche, Mariam Dollar, [name] [facility]   In 4 months Allyson Sabal, Delton See, MD [facility] - ALT in normal range  and within 360 days    ALT  Date Value Ref Range Status  12/09/2022 44 0 - 44 U/L Final         Passed - Last BP in normal range    BP Readings from Last 1 Encounters:  12/09/22 112/76         Passed - Last Heart Rate in normal range    Pulse Readings from Last 1 Encounters:  12/09/22 66

## 2022-12-17 NOTE — Telephone Encounter (Signed)
Prescription Request  12/17/2022  LOV: [id number]  What is the name of the medication or equipment? carvedilol (COREG) 12.5 MG tablet [[id number]]  Have you contacted your pharmacy to request a refill? Yes   Which pharmacy would you like this sent to?  [id number] [place], [place] - [address] AT [address] [place] [address] Phone: [phone] Fax: [phone]    Patient notified that their request is being sent to the clinical staff for review and that they should receive a response within 2 business days.   Please advise at [facility] [phone]

## 2022-12-18 MED ORDER — CARVEDILOL 12.5 MG PO TABS
12.5000 mg | ORAL_TABLET | Freq: Two times a day (BID) | ORAL | 3 refills | Status: DC
Start: 2022-12-18 — End: 2023-01-27

## 2022-12-24 ENCOUNTER — Encounter: Payer: Self-pay | Admitting: Student

## 2022-12-24 ENCOUNTER — Ambulatory Visit (INDEPENDENT_AMBULATORY_CARE_PROVIDER_SITE_OTHER): Payer: PPO | Admitting: Student

## 2022-12-24 ENCOUNTER — Ambulatory Visit
Admission: RE | Admit: 2022-12-24 | Discharge: 2022-12-24 | Disposition: A | Discharge status: Home / Self Care | Payer: PPO | Source: Ambulatory Visit | Attending: Cardiology | Admitting: Cardiology

## 2022-12-24 ENCOUNTER — Other Ambulatory Visit: Payer: Self-pay

## 2022-12-24 ENCOUNTER — Other Ambulatory Visit (HOSPITAL_COMMUNITY): Payer: Self-pay

## 2022-12-24 ENCOUNTER — Other Ambulatory Visit: Payer: Self-pay | Admitting: Oncology

## 2022-12-24 VITALS — BP 98/80 | HR 79 | Ht 70.0 in | Wt 200.0 lb

## 2022-12-24 DIAGNOSIS — Z959 Presence of cardiac and vascular implant and graft, unspecified: Secondary | ICD-10-CM | POA: Diagnosis not present

## 2022-12-24 DIAGNOSIS — I4819 Other persistent atrial fibrillation: Secondary | ICD-10-CM

## 2022-12-24 DIAGNOSIS — R5383 Other fatigue: Secondary | ICD-10-CM

## 2022-12-24 DIAGNOSIS — I739 Peripheral vascular disease, unspecified: Secondary | ICD-10-CM | POA: Diagnosis not present

## 2022-12-24 DIAGNOSIS — C220 Liver cell carcinoma: Secondary | ICD-10-CM

## 2022-12-24 LAB — VAS US ABI WITH/WO TBI
Left ABI: 1.25
Right ABI: 1.12

## 2022-12-24 MED ORDER — AMIODARONE HCL 200 MG PO TABS
200.0000 mg | ORAL_TABLET | Freq: Every day | ORAL | 3 refills | Status: DC
Start: 2022-12-24 — End: 2023-01-28

## 2022-12-24 NOTE — Progress Notes (Signed)
  Electrophysiology Office Note:   Date:  12/24/2022  ID:  Matthew Chen, DOB [birth year], MRN [id number]  Primary Cardiologist: Nanetta Batty, MD Electrophysiologist: Regan Lemming, MD   History of Present Illness:   Matthew Chen is a 67 y.o. male with h/o PAF and hepatocellular carcinoma seen today for routine electrophysiology followup.   Since last being seen in our clinic the patient reports doing about the same.  He has had some fatigue, SOB, and emotional moments in the setting of his chemotherapy. He has an MRI later this week to assess his status. He has had some lightheadedness, most notably yesterday getting out of the shower. His appetite has been poor and he has lost some weight. Using ensure with extra protein to try and help with this.   Review of systems complete and found to be negative unless listed in HPI.   AAD history Tried on tikosyn, had QT prolongation in the setting of chemo use Started on amiodarone 09/2022  Studies Reviewed:    EKG is ordered today. Personal review shows NSR at 79 bpm  Physical Exam:   VS:  BP 98/80   Pulse 79   Ht 5\' 10"  (1.778 m)   Wt 200 lb (90.7 kg)   SpO2 98%   BMI 28.70 kg/m    Wt Readings from Last 3 Encounters:  12/24/22 200 lb (90.7 kg)  12/09/22 196 lb 14.4 oz (89.3 kg)  11/18/22 201 lb 12.8 oz (91.5 kg)     GEN: Well nourished, well developed in no acute distress NECK: No JVD; No carotid bruits CARDIAC: Regular rate and rhythm, no murmurs, rubs, gallops RESPIRATORY:  Clear to auscultation without rales, wheezing or rhonchi  ABDOMEN: Soft, non-tender, non-distended EXTREMITIES:  No edema; No deformity   ASSESSMENT AND PLAN:    Persistent atrial fibrillation EKG today shows NSR at 79 bpm  Failed tikosyn due to QT prolongation, complicated by lenvatinib use Decrease amiodarone to 200 mg daily Surveillance labs today.   Hepatocellular carcinoma Changed to cabozantinib for more AAD options.  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 Have discussed at length with the patient that amiodarone is likely his best bet at maintaining NSR, and we will monitor liver function closely.  Fatigue Multifactorial Surveillance labs as above on amiodarone.   Follow up with Dr. Elberta Fortis in 3 months, sooner with issues.   Signed, Graciella Freer, [name]

## 2022-12-24 NOTE — Patient Instructions (Signed)
Medication Instructions:  1.Decrease amiodarone to 200 mg daily *If you need a refill on your cardiac medications before your next appointment, please call your pharmacy*  Lab Work: CMET, TSH, FreeT4, CBC--TODAY If you have labs (blood work) drawn today and your tests are completely normal, you will receive your results only by: MyChart Message (if you have MyChart) OR A paper copy in the mail If you have any lab test that is abnormal or we need to change your treatment, we will call you to review the results.  Follow-Up: At [facility], you and your health needs are our priority.  As part of our continuing mission to provide you with exceptional heart care, we have created designated Provider Care Teams.  These Care Teams include your primary Cardiologist (physician) and Advanced Practice Providers (APPs -  Physician Assistants and Nurse Practitioners) who all work together to provide you with the care you need, when you need it.  Your next appointment:   3 month(s)  Provider:   Loman Brooklyn, MD

## 2022-12-25 ENCOUNTER — Other Ambulatory Visit (HOSPITAL_COMMUNITY): Payer: Self-pay

## 2022-12-25 ENCOUNTER — Other Ambulatory Visit: Payer: Self-pay

## 2022-12-25 LAB — CBC
Hematocrit: 44.2 % (ref 37.5–51.0)
Hemoglobin: 14.1 g/dL (ref 13.0–17.7)
MCH: 28.1 pg (ref 26.6–33.0)
MCHC: 31.9 g/dL (ref 31.5–35.7)
MCV: 88 fL (ref 79–97)
Platelets: 261 10*3/uL (ref 150–450)
RBC: 5.01 x10E6/uL (ref 4.14–5.80)
RDW: 25.1 % — ABNORMAL HIGH (ref 11.6–15.4)
WBC: 6.4 10*3/uL (ref 3.4–10.8)

## 2022-12-25 LAB — COMPREHENSIVE METABOLIC PANEL
ALT: 49 IU/L — ABNORMAL HIGH (ref 0–44)
AST: 67 IU/L — ABNORMAL HIGH (ref 0–40)
Albumin/Globulin Ratio: 1 — ABNORMAL LOW (ref 1.2–2.2)
Albumin: 3.1 g/dL — ABNORMAL LOW (ref 3.9–4.9)
Alkaline Phosphatase: 161 IU/L — ABNORMAL HIGH (ref 44–121)
BUN/Creatinine Ratio: 14 (ref 10–24)
BUN: 31 mg/dL — ABNORMAL HIGH (ref 8–27)
Bilirubin Total: 0.9 mg/dL (ref 0.0–1.2)
CO2: 22 mmol/L (ref 20–29)
Calcium: 8.8 mg/dL (ref 8.6–10.2)
Chloride: 99 mmol/L (ref 96–106)
Creatinine, Ser: 2.14 mg/dL — ABNORMAL HIGH (ref 0.76–1.27)
Globulin, Total: 3.1 g/dL (ref 1.5–4.5)
Glucose: 168 mg/dL — ABNORMAL HIGH (ref 70–99)
Potassium: 4.6 mmol/L (ref 3.5–5.2)
Sodium: 136 mmol/L (ref 134–144)
Total Protein: 6.2 g/dL (ref 6.0–8.5)
eGFR: 33 mL/min/{1.73_m2} — ABNORMAL LOW (ref >59)

## 2022-12-25 LAB — T4, FREE: Free T4: 1.38 ng/dL (ref 0.82–1.77)

## 2022-12-25 LAB — TSH: TSH: 4.01 u[IU]/mL (ref 0.450–4.500)

## 2022-12-25 MED ORDER — CABOMETYX 60 MG PO TABS
60.0000 mg | ORAL_TABLET | Freq: Every day | ORAL | 0 refills | Status: DC
Start: 2022-12-25 — End: 2022-12-31
  Filled 2022-12-25: qty 30, 30d supply, fill #0

## 2022-12-26 ENCOUNTER — Other Ambulatory Visit (HOSPITAL_COMMUNITY): Payer: Self-pay

## 2022-12-26 ENCOUNTER — Other Ambulatory Visit: Payer: Self-pay

## 2022-12-27 ENCOUNTER — Other Ambulatory Visit: Payer: Self-pay

## 2022-12-27 ENCOUNTER — Ambulatory Visit
Admission: RE | Admit: 2022-12-27 | Discharge: 2022-12-27 | Disposition: A | Discharge status: Home / Self Care | Payer: PPO | Source: Ambulatory Visit | Attending: Nurse Practitioner | Admitting: Nurse Practitioner

## 2022-12-27 DIAGNOSIS — K8020 Calculus of gallbladder without cholecystitis without obstruction: Secondary | ICD-10-CM | POA: Diagnosis not present

## 2022-12-27 DIAGNOSIS — C220 Liver cell carcinoma: Secondary | ICD-10-CM

## 2022-12-27 MED ORDER — GADOPICLENOL 0.5 MMOL/ML IV SOLN
9.0000 mL | Freq: Once | INTRAVENOUS | Status: AC | PRN
  Administered 2022-12-27: 9 mL via INTRAVENOUS

## 2022-12-30 ENCOUNTER — Ambulatory Visit (INDEPENDENT_AMBULATORY_CARE_PROVIDER_SITE_OTHER): Payer: PPO | Admitting: Family Medicine

## 2022-12-30 ENCOUNTER — Other Ambulatory Visit: Payer: Self-pay

## 2022-12-30 ENCOUNTER — Other Ambulatory Visit (INDEPENDENT_AMBULATORY_CARE_PROVIDER_SITE_OTHER): Payer: PPO

## 2022-12-30 ENCOUNTER — Encounter: Payer: Self-pay | Admitting: Family Medicine

## 2022-12-30 ENCOUNTER — Telehealth: Payer: Self-pay | Admitting: Gastroenterology

## 2022-12-30 VITALS — BP 110/58 | HR 109 | Temp 98.9°F | Ht 70.0 in | Wt 196.0 lb

## 2022-12-30 DIAGNOSIS — R159 Full incontinence of feces: Secondary | ICD-10-CM | POA: Diagnosis not present

## 2022-12-30 DIAGNOSIS — R152 Fecal urgency: Secondary | ICD-10-CM | POA: Diagnosis not present

## 2022-12-30 DIAGNOSIS — K51919 Ulcerative colitis, unspecified with unspecified complications: Secondary | ICD-10-CM

## 2022-12-30 LAB — SEDIMENTATION RATE: Sed Rate: 75 mm/hr — ABNORMAL HIGH (ref 0–20)

## 2022-12-30 LAB — HIGH SENSITIVITY CRP: CRP, High Sensitivity: 80.78 mg/L — ABNORMAL HIGH (ref 0.000–5.000)

## 2022-12-30 MED ORDER — DIPHENOXYLATE-ATROPINE 2.5-0.025 MG PO TABS
1.0000 | ORAL_TABLET | Freq: Two times a day (BID) | ORAL | 0 refills | Status: DC
Start: 2022-12-30 — End: 2023-01-06

## 2022-12-30 NOTE — Telephone Encounter (Signed)
Patient called stating that he has infusions every 2 month for colitis. States he does not think the infusions are working as well anymore due to losing control of his bm's for the last 3 weeks. Informed him the next appointment available is in July. Requesting a call back to see what could be done before then. Please advise, thank you.

## 2022-12-30 NOTE — Telephone Encounter (Signed)
The pt last seen 07/11/21 for procedure with Dr Russella Dar.  He states he is getting Entyvio infusions every 8 weeks but does not feel it is working any longer. He has had loose stools for the past 3 weeks.  He has lost control of his bowels as well.  He has been scheduled for an appt on 8/12 at 830 am with Dr Russella Dar (first available). Pt only wants to see Dr Russella Dar.   Dr Russella Dar any recommendations in the meantime

## 2022-12-30 NOTE — Progress Notes (Signed)
Subjective:    Patient ID: Matthew Chen, male    DOB: 1955-10-16, 67 y.o.   MRN: [id number]   Patient is battling hepatocellular carcinoma.  He is currently on cabozantinib.  50 to 75% of people reports diarrhea with this medication.  The patient states that over the last 4 weeks, he has had 4 episodes of fecal incontinence.  He will have the sudden urge to defecate and he will be unable to make it to the toilet in time.  He reports explosive diarrhea when this happens.  Past medical history is also significant for ulcerative colitis.  He denies any blood in his stool.  He has an appointment to see his gastroenterologist tomorrow.  They checked a sed rate and a CRP both of which were extremely high.  He denies any fevers or chills.  He denies any nausea or vomiting.  However he reports weakness.  Patient appears pale.  He states that he just wants to live until October we can see his granddaughter get married.  He states that his quality of life is very poor  Past Medical History:  Diagnosis Date   Adenomatous colon polyp 03/2008   Anginal pain (HCC)    Aortic aneurysm, thoracic (HCC) 09/09/2016   4.4 cm by echo 05/2015   Arthritis    "joints in my fingers ache" (07/13/2015)   Bicuspid aortic valve 09/09/2016   Cataract 2019   bilateral eyes   CKD (chronic kidney disease), stage III (HCC)    Coronary artery disease    Diabetes mellitus without complication (HCC)    Hemorrhoids    Hepatocellular carcinoma (HCC)    Hyperlipidemia    Hypertension    Paroxysmal atrial fibrillation (HCC)    Peripheral arterial disease (HCC)    a. dopppler 05/29/2015 revealed high-grade right popliteal and distal left SFA disease b. LE angio 06/28/2015 revealing high grade calcific dx with distal L SFA and R popliteal artery s/p diamondback orbital rotational atherectomy, PTA of L SFA  c. 07/13/2015 diamondback orbital rotational atherectomy and drug eluting balloon angioplasty of R popliteal artery (P2 segment)  using distal protection    Type II diabetes mellitus (HCC)    Ulcerative colitis    Past Surgical History:  Procedure Laterality Date   ABDOMINAL AORTOGRAM W/LOWER EXTREMITY N/A 04/27/2018   Procedure: ABDOMINAL AORTOGRAM W/LOWER EXTREMITY;  Surgeon: Runell Gess, MD;  Location: [facility];  Service: Cardiovascular;  Laterality: N/A;   COLONOSCOPY     COLONOSCOPY W/ POLYPECTOMY  X 4-5   CORONARY ATHERECTOMY N/A 02/15/2021   Procedure: CORONARY ATHERECTOMY;  Surgeon: Runell Gess, MD;  Location: [facility];  Service: Cardiovascular;  Laterality: N/A;  aborted    CORONARY BALLOON ANGIOPLASTY N/A 02/15/2021   Procedure: CORONARY BALLOON ANGIOPLASTY;  Surgeon: Runell Gess, MD;  Location: [facility];  Service: Cardiovascular;  Laterality: N/A;   COSMETIC SURGERY  < 2000   "removed birthmark from top of my head"   IR ANGIOGRAM SELECTIVE EACH ADDITIONAL VESSEL  06/08/2021   IR ANGIOGRAM SELECTIVE EACH ADDITIONAL VESSEL  06/08/2021   IR ANGIOGRAM SELECTIVE EACH ADDITIONAL VESSEL  06/08/2021   IR ANGIOGRAM SELECTIVE EACH ADDITIONAL VESSEL  06/08/2021   IR ANGIOGRAM SELECTIVE EACH ADDITIONAL VESSEL  06/21/2021   IR ANGIOGRAM SELECTIVE EACH ADDITIONAL VESSEL  06/21/2021   IR ANGIOGRAM SELECTIVE EACH ADDITIONAL VESSEL  06/21/2021   IR ANGIOGRAM SELECTIVE EACH ADDITIONAL VESSEL  06/21/2021   IR ANGIOGRAM VISCERAL SELECTIVE  06/08/2021   IR ANGIOGRAM VISCERAL SELECTIVE  06/21/2021   IR EMBO ARTERIAL NOT HEMORR HEMANG INC GUIDE ROADMAPPING  06/08/2021   IR EMBO TUMOR ORGAN ISCHEMIA INFARCT INC GUIDE ROADMAPPING  06/21/2021   IR EMBO TUMOR ORGAN ISCHEMIA INFARCT INC GUIDE ROADMAPPING  06/21/2021   IR RADIOLOGIST EVAL & MGMT  05/08/2021   IR RADIOLOGIST EVAL & MGMT  07/26/2021   IR RADIOLOGIST EVAL & MGMT  09/05/2021   IR RADIOLOGIST EVAL & MGMT  12/27/2021   IR US GUIDE VASC ACCESS LEFT  06/21/2021   IR US GUIDE VASC ACCESS RIGHT  06/08/2021   LEFT HEART CATH  AND CORONARY ANGIOGRAPHY N/A 02/08/2021   Procedure: LEFT HEART CATH AND CORONARY ANGIOGRAPHY;  Surgeon: Runell Gess, MD;  Location: [facility];  Service: Cardiovascular;  Laterality: N/A;   PERIPHERAL VASCULAR ATHERECTOMY  04/27/2018   Procedure: PERIPHERAL VASCULAR ATHERECTOMY;  Surgeon: Runell Gess, MD;  Location: [facility];  Service: Cardiovascular;;  right popliteal artery   PERIPHERAL VASCULAR CATHETERIZATION N/A 06/29/2015   Procedure: Lower Extremity Angiography;  Surgeon: Runell Gess, MD;  Location: [facility];  Service: Cardiovascular;  Laterality: N/A;   PERIPHERAL VASCULAR CATHETERIZATION N/A 07/13/2015   Procedure: Lower Extremity Angiography;  Surgeon: Runell Gess, MD;  Location: [facility];  Service: Cardiovascular;  Laterality: N/A;   PERIPHERAL VASCULAR CATHETERIZATION  07/13/2015   Procedure: Peripheral Vascular Atherectomy;  Surgeon: Runell Gess, MD;  Location: [facility];  Service: Cardiovascular;;   PERIPHERAL VASCULAR INTERVENTION  04/27/2018   Procedure: PERIPHERAL VASCULAR INTERVENTION;  Surgeon: Runell Gess, MD;  Location: [facility];  Service: Cardiovascular;;  right popliteal artery   POLYPECTOMY     TONSILLECTOMY     UPPER GASTROINTESTINAL ENDOSCOPY     Current Outpatient Medications on File Prior to Visit  Medication Sig Dispense Refill   acetaminophen (TYLENOL) 500 MG tablet Take 1 tablet (500 mg total) by mouth every 6 (six) hours as needed. 30 tablet 0   amiodarone (PACERONE) 200 MG tablet Take 1 tablet (200 mg total) by mouth daily. 90 tablet 3   apixaban (ELIQUIS) 5 MG TABS tablet Take 1 tablet (5 mg total) by mouth 2 (two) times daily. 180 tablet 3   aspirin EC 81 MG tablet Take 1 tablet (81 mg total) by mouth daily. 90 tablet 3   atorvastatin (LIPITOR) 80 MG tablet Take 1 tablet (80 mg total) by mouth daily. 90 tablet 3   cabozantinib (CABOMETYX) 60 MG tablet Take 1 tablet (60  mg total) by mouth daily. Take on an empty stomach, 1 hour before or 2 hours after meals. 30 tablet 0   carvedilol (COREG) 12.5 MG tablet Take 1 tablet (12.5 mg total) by mouth 2 (two) times daily with a meal. 180 tablet 3   cyanocobalamin (VITAMIN B12) 1000 MCG tablet Take 1,000 mcg by mouth daily.     diltiazem (CARDIZEM) 30 MG tablet Take 1 tablet (30 mg total) by mouth 3 (three) times daily as needed (palpitations). 90 tablet 3   empagliflozin (JARDIANCE) 10 MG TABS tablet Take 1 tablet (10 mg total) by mouth daily. 90 tablet 3   fenofibrate 160 MG tablet Take 1 tablet (160 mg total) by mouth daily. 90 tablet 3   glucose blood (ONETOUCH ULTRA) test strip CHECK BLOOD SUGAR TWICE A DAY 50 strip 15   hydrochlorothiazide (HYDRODIURIL) 25 MG tablet Take 1 tablet (25 mg total) by  mouth daily. 90 tablet 3   Insulin Pen Needle (B-D UF III MINI PEN NEEDLES) 31G X 5 MM MISC Use daily with Victoza 100 each 11   lisinopril (ZESTRIL) 20 MG tablet Take 1 tablet (20 mg total) by mouth daily. 90 tablet 3   Omega-3 Fatty Acids (FISH OIL) 1000 MG CAPS Take 1,000 mg by mouth 2 (two) times daily.     pantoprazole (PROTONIX) 40 MG tablet Take 1 tablet (40 mg total) by mouth 2 (two) times daily. 60 tablet 11   pioglitazone (ACTOS) 30 MG tablet TAKE 1 TABLET BY MOUTH EVERY DAY. STRENGTH: 30 MG 90 tablet 1   sildenafil (VIAGRA) 100 MG tablet Take 0.5-1 tablets (50-100 mg total) by mouth daily as needed for erectile dysfunction. 5 tablet 11   sitaGLIPtin (JANUVIA) 100 MG tablet Take 1 tablet (100 mg total) by mouth daily. 90 tablet 3   vedolizumab (ENTYVIO) 300 MG injection Inject 300 mg as directed See admin instructions. Every 2 months     No current facility-administered medications on file prior to visit.   Allergies  Allergen Reactions   Penicillins Other (See Comments)    Unsure, childhood reaction Has patient had a PCN reaction causing immediate rash, facial/tongue/throat swelling, SOB or lightheadedness  with hypotension: Unknown Has patient had a PCN reaction causing severe rash involving mucus membranes or skin necrosis: Unknown Has patient had a PCN reaction that required hospitalization: No Has patient had a PCN reaction occurring within the last 10 years: No If all of the above answers are "NO", then may proceed with Cephalosporin use.    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, building services  Tobacco Use   Smoking status: Former    Packs/day: 1.50    Years: 25.00    Additional pack years: 0.00    Total pack years: 37.50    Types: Cigarettes    Quit date: 08/12/1996    Years since quitting: 26.4   Smokeless tobacco: Never   Tobacco comments:    Former smoker 09/17/2022  Vaping Use   Vaping Use: Never used  Substance and Sexual Activity   Alcohol use: Yes    Alcohol/week: 1.0 standard drink of alcohol    Types: 1 Cans of beer per week    Comment: social use   Drug use: No   Sexual activity: Yes  Other Topics Concern   Not on file  Social History Narrative   ** Merged History Encounter **       Social Determinants of Health   Financial Resource Strain: Low Risk  (08/17/2021)   Overall Financial Resource Strain (CARDIA)    Difficulty of Paying Living Expenses: Not very hard  Food Insecurity: No Food Insecurity (10/06/2022)   Hunger Vital Sign    Worried About Running Out of Food in the Last Year: Never true    Ran Out of Food in the Last Year: Never true  Transportation Needs: No Transportation Needs (10/06/2022)   PRAPARE - Administrator, Civil Service (Medical): No    Lack of Transportation (Non-Medical): No  Physical Activity: Not on file  Stress: Not on file  Social Connections: Not on file  Intimate Partner Violence: Not At  Risk (10/06/2022)   Humiliation, Afraid, Rape, and Kick questionnaire    Fear of Current or Ex-Partner: No    Emotionally Abused: No    Physically Abused: No    Sexually Abused: No      Review of Systems  All other systems reviewed and are negative.      Objective:   Physical Exam Vitals reviewed.  Constitutional:      General: He is not in acute distress.    Appearance: He is well-developed. He is ill-appearing. He is not toxic-appearing or diaphoretic.  Neck:     Thyroid: No thyromegaly.     Vascular: No JVD.  Cardiovascular:     Rate and Rhythm: Normal rate. Rhythm irregular.     Heart sounds: Murmur heard.  Pulmonary:     Effort: Pulmonary effort is normal. No respiratory distress.     Breath sounds: Normal breath sounds. No wheezing or rales.  Abdominal:     General: Bowel sounds are normal. There is no distension.     Palpations: Abdomen is soft.     Tenderness: There is no abdominal tenderness. There is no guarding or rebound.  Skin:    General: Skin is warm.     Coloration: Skin is pale.     Findings: No rash.  Neurological:     Mental Status: He is alert.           Assessment & Plan:  Incontinence of feces with fecal urgency This potentially could be a side effect from his chemotherapy medicine.  He could also be his ulcerative colitis.  He has an appointment to see gastroenterology tomorrow.  I will defer to their judgment regarding his ulcerative colitis maintenance medication especially given the elevated sedimentation rate and CRP.  We could also try to help manage side effects.  Try Lomotil 1 tablet twice a day as a preventative.  May try to wean back to once a day if effective

## 2022-12-30 NOTE — Telephone Encounter (Signed)
The pt has been advised of the appt for tomorrow and labs/stool test.  He will come in today for labs.

## 2022-12-30 NOTE — Progress Notes (Unsigned)
12/31/2022 Matthew Chen [id number] 20-Dec-1955  Referring provider: Donita Brooks, MD Primary GI doctor: Dr. Russella Dar  ASSESSMENT AND PLAN:   67 year old male with history of ulcerative colitis since 2002 presents with worsening diarrhea, associated with rectal pain and fecal incontinence. Patient is been on Entyvio since 2019 Had C. difficile infection 2022, denies any recent antibiotics Has been on cabometyx for the last 3 months for [facility], following with Dr. Alcide Evener. Very concerning for ulcerative colitis flare/ with elevated sed rate and CRP at 75 and 80 versus from [facility] med carbometyx which can cause concern for fistulas, inflammation, diarrhea, last dose was this morning. CBC without anemia or leukocytosis which is reassuring Recent MRI abdomen shows no evidence of bowel inflammation, this did not get the rectum. Will need to rule out infection,   -will get CT pelvis with contrast to evaluate for abscess -pending C. difficile, GI stool profile If there is no inection by stool or CT will  start patient on prednisone taper for UC flare  - sent in mesalamine suppositories in the meantime and camol samples. -Pending vedolizumab antibody and titer to evaluate for failure of medication  Hepatocellular carcinoma (HCC) Following with Dr. Alcide Evener MRI abdomen and pelvis recently showed no metastasis Last dose of carbometyx  this morning  Odynophagia No obvious ulcers on exam Most likely from carbometyx  Sent in magic mouth wash Consider diflucan if not improving off the medication, would need EKG prior to rule out Qtc prolongation  Other orders -     mesalamine (CANASA) 1000 MG suppository; Place 1 suppository (1,000 mg total) rectally at bedtime. -     magic mouthwash w/lidocaine SOLN; Take 5 mLs by mouth every 6 (six) hours as needed for mouth pain.     Patient Care Team: Donita Brooks, MD as PCP - General (Family Medicine) Runell Gess, MD as PCP - Cardiology  (Cardiology) Regan Lemming, MD as PCP - Electrophysiology (Cardiology) Donita Brooks, MD (Family Medicine) Chilton Si, MD as Attending Physician (Cardiology) Runell Gess, MD as Consulting Physician (Cardiology) Erroll Luna, [facility] as Pharmacist (Pharmacist)  HISTORY OF PRESENT ILLNESS: 67 y.o. male presents for evaluation of left-sided ulcerative colitis. Last seen in the office on 06/20/2021.   IBD history (prior medications titers why stopped and current meds with last dose): Diagnosed 2003 after colonoscopy. Initially treated with balsalzide  03/2015 colonoscopy showed active left-sided colitis started on mesalamine 4 g rectally for 2 weeks and then prednisone taper 2018 started on mesalamine 2.4 g 2 times daily 2019 budesonide 9 mg daily and Lialda 2.4 g twice daily for uncontrolled symptoms had negative hepatitis B and TB Gold at that time. 12/2017 Entyvio initiated  Last colonoscopy: 06/2021, medication patient was on Entyvio. 5 mm polyp transverse colon, erythematous mucosa in the rectum sigmoid and descending colon.  Benign polyp, inactive chronic colitis Recall 3 years Last small bowel imaging: 12/27/2022 MRI with and without HCC 1. Significant technical limitation of prior examination dated 07/20/2022 makes direct comparison somewhat difficult, however there is no obvious change in heterogeneously enhancing liver lesions, largest of the inferior right lobe of the liver, hepatic segments V/VI, and in the superior left lobe of the liver, hepatic segment II. Findings remain consistent with multifocal hepatocellular carcinoma. 2. Cholelithiasis without evidence of acute cholecystitis. Extraintestinal manifestations: The patient has not had any extraintestinal symptoms  Other significant medical history: History of C. difficile 2022 Afib on Eliquis and amiodarone Diabetes on  actos, jardiance Hepatocellular carcinoma without evidence of metastasis  follows with Dr. Alcide Evener, not candidate for partial lobectomy or liver transplant per Dr. Donell Beers -Radioembolization segmentectomy B12 def, on B12  Current History Wife is here with him.  He states he has had stool urgency for years however over the last 3 weeks he has had stool incontinence 4 different occasions.  He has had a lot of gas.  He is having BM 4-5 x a day on average for the last year with [facility] medications. Loose stools, small volume BRB in stool, no mucus. He has separate lower AB cramping intermittently for last year, will happen 3-4 x a week, not associated with Bm's and no improvement with Bm's. Nothing makes the pain better.  He gets up 2-3 x a night for nocturia, occ will have nocturnal loose stools.  He has had rectal pain x 3 weeks, hurts to wipe/hurts to sit, burning pain.  He has been on cancer medication for 3 months, cabometyx 60 mg.  He just saw Dr. Alcide Evener and he stated to discontinue the cabometyx for now, last dose this AM.  The patient does not have fever but stays cold.  The patient has weight loss, he is down 7 lbs in last 2 weeks. He has decreased appetite. He states when he drinks tea/water that is cold it burns going down, hoarseness is new.  Denies GERD, nausea, vomiting.    Due for next entiviyo infusion for the 28th.  No ABX use.   Recent labs: 12/30/2022 SED RATE 75, CRP HS 80 12/31/2022 WBC 7.2 HGB 13.9 MCV 84.5  B12 216, he is on B12 12/24/2022 AST 67 ALT 49 Alkphos 161 TBili 0.9 Pending Cdiff, fecal cal, Giprofile, and vedolizumab antibody  Immunization History  Administered Date(s) Administered   Fluad Quad(high Dose 65+) 06/04/2022   Influenza Inj Mdck Quad Pf 06/14/2017   Influenza, High Dose Seasonal PF 05/22/2021   Influenza,inj,Quad PF,6+ Mos 07/22/2013, 06/12/2015, 06/10/2016, 04/21/2018, 05/03/2019   Influenza-Unspecified 06/14/2017, 04/21/2018   PNEUMOCOCCAL CONJUGATE-20 05/30/2022   Pneumococcal Polysaccharide-23 06/23/2017     He   reports that he quit smoking about 26 years ago. His smoking use included cigarettes. He has a 37.50 pack-year smoking history. He has never used smokeless tobacco. He reports current alcohol use of about 1.0 standard drink of alcohol per week. He reports that he does not use drugs.  RELEVANT LABS AND IMAGING: CBC    Component Value Date/Time   WBC 7.2 12/31/2022 0807   WBC 5.0 10/06/2022 0210   RBC 4.89 12/31/2022 0807   HGB 13.9 12/31/2022 0807   HGB 14.1 12/24/2022 1249   HCT 41.3 12/31/2022 0807   HCT 44.2 12/24/2022 1249   PLT 210 12/31/2022 0807   PLT 261 12/24/2022 1249   MCV 84.5 12/31/2022 0807   MCV 88 12/24/2022 1249   MCH 28.4 12/31/2022 0807   MCHC 33.7 12/31/2022 0807   RDW 26.5 (H) 12/31/2022 0807   RDW 25.1 (H) 12/24/2022 1249   LYMPHSABS 1.5 12/31/2022 0807   MONOABS 0.8 12/31/2022 0807   EOSABS 0.1 12/31/2022 0807   BASOSABS 0.1 12/31/2022 0807   Recent Labs    08/14/22 1030 09/23/22 0809 10/05/22 1715 10/06/22 0210 10/18/22 0848 10/31/22 0819 11/18/22 1011 12/09/22 1038 12/24/22 1249 12/31/22 0807  HGB 12.5* 14.6 14.3 12.6* 13.7 14.0 16.5 14.7 14.1 13.9    CMP     Component Value Date/Time   NA 132 (L) 12/31/2022 0807   NA 136 12/24/2022 1249  K 4.1 12/31/2022 0807   CL 97 (L) 12/31/2022 0807   CO2 27 12/31/2022 0807   GLUCOSE 110 (H) 12/31/2022 0807   BUN 35 (H) 12/31/2022 0807   BUN 31 (H) 12/24/2022 1249   CREATININE 1.85 (H) 12/31/2022 0807   CREATININE 1.77 (H) 05/27/2022 0835   CALCIUM 8.8 (L) 12/31/2022 0807   PROT 6.5 12/31/2022 0807   PROT 6.2 12/24/2022 1249   ALBUMIN 3.1 (L) 12/31/2022 0807   ALBUMIN 3.1 (L) 12/24/2022 1249   AST 58 (H) 12/31/2022 0807   ALT 38 12/31/2022 0807   ALKPHOS 91 12/31/2022 0807   BILITOT 1.4 (H) 12/31/2022 0807   GFRNONAA 39 (L) 12/31/2022 0807   GFRNONAA 42 (L) 11/20/2020 0830   GFRAA 49 (L) 11/20/2020 0830      Latest Ref Rng & Units 12/31/2022    8:07 AM 12/24/2022   12:49 PM 12/09/2022    10:38 AM  Hepatic Function  Total Protein 6.5 - 8.1 g/dL 6.5  6.2  6.5   Albumin 3.5 - 5.0 g/dL 3.1  3.1  3.4   AST 15 - 41 U/L 58  67  54   ALT 0 - 44 U/L 38  49  44   Alk Phosphatase 38 - 126 U/L 91  161  114   Total Bilirubin 0.3 - 1.2 mg/dL 1.4  0.9  1.1       Latest Ref Rng & Units 12/13/2021    8:08 AM  Hepatitis C  AFP <6.1 ng/mL 4.1     Current Medications:   Current Outpatient Medications (Endocrine & Metabolic):    empagliflozin (JARDIANCE) 10 MG TABS tablet, Take 1 tablet (10 mg total) by mouth daily.   pioglitazone (ACTOS) 30 MG tablet, TAKE 1 TABLET BY MOUTH EVERY DAY. STRENGTH: 30 MG   sitaGLIPtin (JANUVIA) 100 MG tablet, Take 1 tablet (100 mg total) by mouth daily.  Current Outpatient Medications (Cardiovascular):    amiodarone (PACERONE) 200 MG tablet, Take 1 tablet (200 mg total) by mouth daily.   atorvastatin (LIPITOR) 80 MG tablet, Take 1 tablet (80 mg total) by mouth daily.   carvedilol (COREG) 12.5 MG tablet, Take 1 tablet (12.5 mg total) by mouth 2 (two) times daily with a meal.   diltiazem (CARDIZEM) 30 MG tablet, Take 1 tablet (30 mg total) by mouth 3 (three) times daily as needed (palpitations).   fenofibrate 160 MG tablet, Take 1 tablet (160 mg total) by mouth daily.   lisinopril (ZESTRIL) 20 MG tablet, Take 1 tablet (20 mg total) by mouth daily.   sildenafil (VIAGRA) 100 MG tablet, Take 0.5-1 tablets (50-100 mg total) by mouth daily as needed for erectile dysfunction.  Current Outpatient Medications (Respiratory):    magic mouthwash w/lidocaine SOLN*, Take 5 mLs by mouth every 6 (six) hours as needed for mouth pain.  Current Outpatient Medications (Analgesics):    acetaminophen (TYLENOL) 500 MG tablet, Take 1 tablet (500 mg total) by mouth every 6 (six) hours as needed.   aspirin EC 81 MG tablet, Take 1 tablet (81 mg total) by mouth daily.  Current Outpatient Medications (Hematological):    apixaban (ELIQUIS) 5 MG TABS tablet, Take 1 tablet (5 mg  total) by mouth 2 (two) times daily.   cyanocobalamin (VITAMIN B12) 1000 MCG tablet, Take 1,000 mcg by mouth daily.  Current Outpatient Medications (Other):    diphenoxylate-atropine (LOMOTIL) 2.5-0.025 MG tablet, Take 1 tablet by mouth in the morning and at bedtime.   glucose blood (ONETOUCH ULTRA)  test strip, CHECK BLOOD SUGAR TWICE A DAY   Insulin Pen Needle (B-D UF III MINI PEN NEEDLES) 31G X 5 MM MISC, Use daily with Victoza   magic mouthwash w/lidocaine SOLN*, Take 5 mLs by mouth every 6 (six) hours as needed for mouth pain.   mesalamine (CANASA) 1000 MG suppository, Place 1 suppository (1,000 mg total) rectally at bedtime.   Omega-3 Fatty Acids (FISH OIL) 1000 MG CAPS, Take 1,000 mg by mouth 2 (two) times daily.   pantoprazole (PROTONIX) 40 MG tablet, Take 1 tablet (40 mg total) by mouth 2 (two) times daily.   vedolizumab (ENTYVIO) 300 MG injection, Inject 300 mg as directed See admin instructions. Every 2 months * These medications belong to multiple therapeutic classes and are listed under each applicable group.  Medical History:  Past Medical History:  Diagnosis Date   Adenomatous colon polyp 03/2008   Anginal pain (HCC)    Aortic aneurysm, thoracic (HCC) 09/09/2016   4.4 cm by echo 05/2015   Arthritis    "joints in my fingers ache" (07/13/2015)   Bicuspid aortic valve 09/09/2016   Cataract 2019   bilateral eyes   CKD (chronic kidney disease), stage III (HCC)    Coronary artery disease    Diabetes mellitus without complication (HCC)    Hemorrhoids    Hepatocellular carcinoma (HCC)    Hyperlipidemia    Hypertension    Paroxysmal atrial fibrillation (HCC)    Peripheral arterial disease (HCC)    a. dopppler 05/29/2015 revealed high-grade right popliteal and distal left SFA disease b. LE angio 06/28/2015 revealing high grade calcific dx with distal L SFA and R popliteal artery s/p diamondback orbital rotational atherectomy, PTA of L SFA  c. 07/13/2015 diamondback orbital  rotational atherectomy and drug eluting balloon angioplasty of R popliteal artery (P2 segment) using distal protection    Type II diabetes mellitus (HCC)    Ulcerative colitis    Allergies:  Allergies  Allergen Reactions   Penicillins Other (See Comments)    Unsure, childhood reaction Has patient had a PCN reaction causing immediate rash, facial/tongue/throat swelling, SOB or lightheadedness with hypotension: Unknown Has patient had a PCN reaction causing severe rash involving mucus membranes or skin necrosis: Unknown Has patient had a PCN reaction that required hospitalization: No Has patient had a PCN reaction occurring within the last 10 years: No If all of the above answers are "NO", then may proceed with Cephalosporin use.      Surgical History:  He  has a past surgical history that includes Cosmetic surgery (< 2000); Colonoscopy w/ polypectomy (X 4-5); Cardiac catheterization (N/A, 06/29/2015); Tonsillectomy; Cardiac catheterization (N/A, 07/13/2015); Cardiac catheterization (07/13/2015); ABDOMINAL AORTOGRAM W/LOWER EXTREMITY (N/A, 04/27/2018); PERIPHERAL VASCULAR ATHERECTOMY (04/27/2018); PERIPHERAL VASCULAR INTERVENTION (04/27/2018); LEFT HEART CATH AND CORONARY ANGIOGRAPHY (N/A, 02/08/2021); CORONARY ATHERECTOMY (N/A, 02/15/2021); CORONARY BALLOON ANGIOPLASTY (N/A, 02/15/2021); IR Radiologist Eval & Mgmt (05/08/2021); IR US Guide Vasc Access Right (06/08/2021); IR Angiogram Selective Each Additional Vessel (06/08/2021); IR EMBO ARTERIAL NOT HEMORR HEMANG INC GUIDE ROADMAPPING (06/08/2021); IR Angiogram Visceral Selective (06/08/2021); IR Angiogram Selective Each Additional Vessel (06/08/2021); IR Angiogram Selective Each Additional Vessel (06/08/2021); IR Angiogram Selective Each Additional Vessel (06/08/2021); IR Angiogram Visceral Selective (06/21/2021); IR Angiogram Selective Each Additional Vessel (06/21/2021); IR EMBO TUMOR ORGAN ISCHEMIA INFARCT INC GUIDE ROADMAPPING (06/21/2021); IR  US Guide Vasc Access Left (06/21/2021); IR Angiogram Selective Each Additional Vessel (06/21/2021); IR EMBO TUMOR ORGAN ISCHEMIA INFARCT INC GUIDE ROADMAPPING (06/21/2021); IR Angiogram Selective Each Additional Vessel (06/21/2021); IR Angiogram  Selective Each Additional Vessel (06/21/2021); Colonoscopy; Polypectomy; Upper gastrointestinal endoscopy; IR Radiologist Eval & Mgmt (07/26/2021); IR Radiologist Eval & Mgmt (09/05/2021); and IR Radiologist Eval & Mgmt (12/27/2021). Family History:  His family history includes Colon cancer in his maternal grandmother; Colon polyps in his mother; Diabetes in his maternal aunt and mother; Heart disease in his father.  REVIEW OF SYSTEMS  : All other systems reviewed and negative except where noted in the History of Present Illness.  PHYSICAL EXAM: BP 100/70   Pulse 73   Ht 5\' 10"  (1.778 m)   Wt 193 lb 3.2 oz (87.6 kg)   BMI 27.72 kg/m  General Appearance: Chronically ill-appearing male, pale Head:   Normocephalic and atraumatic.  No obvious oral lesions.  Dentures. Eyes:  sclerae anicteric,conjunctive pink  Respiratory: Respiratory effort normal, BS equal bilaterally without rales, rhonchi, wheezing. Cardio: RRR with no MRGs. Peripheral pulses intact.  Abdomen: Soft,  Obese ,active bowel sounds. No tenderness . Without guarding and Without rebound. No masses. Rectal: External rectum slightly erythematous, worse posterior rectum, no obvious fissure, very swollen and tender internal exam, positive fecal occult blood, minimal stool, no obvious pus or discharge or abscess noted. Musculoskeletal: Full ROM, Normal gait. With edema. Skin:  Dry and intact without significant lesions or rashes Neuro: Alert and  oriented x4;  No focal deficits. Psych:  Cooperative. Normal mood and affect.    Doree Albee, [name] 12:12 PM

## 2022-12-31 ENCOUNTER — Ambulatory Visit: Payer: PPO | Admitting: Physician Assistant

## 2022-12-31 ENCOUNTER — Encounter: Payer: Self-pay | Admitting: Physician Assistant

## 2022-12-31 ENCOUNTER — Other Ambulatory Visit (HOSPITAL_COMMUNITY): Payer: Self-pay

## 2022-12-31 ENCOUNTER — Other Ambulatory Visit: Payer: PPO

## 2022-12-31 ENCOUNTER — Other Ambulatory Visit: Payer: Self-pay

## 2022-12-31 ENCOUNTER — Inpatient Hospital Stay: Payer: PPO | Attending: Oncology

## 2022-12-31 ENCOUNTER — Inpatient Hospital Stay (HOSPITAL_BASED_OUTPATIENT_CLINIC_OR_DEPARTMENT_OTHER): Payer: PPO | Admitting: Oncology

## 2022-12-31 VITALS — BP 100/70 | HR 73 | Ht 70.0 in | Wt 193.2 lb

## 2022-12-31 VITALS — BP 115/82 | HR 88 | Temp 98.1°F | Resp 18 | Ht 70.0 in | Wt 193.0 lb

## 2022-12-31 DIAGNOSIS — K6289 Other specified diseases of anus and rectum: Secondary | ICD-10-CM

## 2022-12-31 DIAGNOSIS — R159 Full incontinence of feces: Secondary | ICD-10-CM

## 2022-12-31 DIAGNOSIS — C220 Liver cell carcinoma: Secondary | ICD-10-CM | POA: Diagnosis not present

## 2022-12-31 DIAGNOSIS — K51919 Ulcerative colitis, unspecified with unspecified complications: Secondary | ICD-10-CM | POA: Diagnosis not present

## 2022-12-31 DIAGNOSIS — N179 Acute kidney failure, unspecified: Secondary | ICD-10-CM | POA: Diagnosis not present

## 2022-12-31 DIAGNOSIS — I951 Orthostatic hypotension: Secondary | ICD-10-CM | POA: Diagnosis not present

## 2022-12-31 DIAGNOSIS — N170 Acute kidney failure with tubular necrosis: Secondary | ICD-10-CM | POA: Diagnosis not present

## 2022-12-31 DIAGNOSIS — R1310 Dysphagia, unspecified: Secondary | ICD-10-CM

## 2022-12-31 DIAGNOSIS — R197 Diarrhea, unspecified: Secondary | ICD-10-CM | POA: Diagnosis not present

## 2022-12-31 DIAGNOSIS — E860 Dehydration: Secondary | ICD-10-CM | POA: Diagnosis not present

## 2022-12-31 LAB — CBC WITH DIFFERENTIAL (CANCER CENTER ONLY)
Abs Immature Granulocytes: 0.02 10*3/uL (ref 0.00–0.07)
Basophils Absolute: 0.1 10*3/uL (ref 0.0–0.1)
Basophils Relative: 1 %
Eosinophils Absolute: 0.1 10*3/uL (ref 0.0–0.5)
Eosinophils Relative: 1 %
HCT: 41.3 % (ref 39.0–52.0)
Hemoglobin: 13.9 g/dL (ref 13.0–17.0)
Immature Granulocytes: 0 %
Lymphocytes Relative: 21 %
Lymphs Abs: 1.5 10*3/uL (ref 0.7–4.0)
MCH: 28.4 pg (ref 26.0–34.0)
MCHC: 33.7 g/dL (ref 30.0–36.0)
MCV: 84.5 fL (ref 80.0–100.0)
Monocytes Absolute: 0.8 10*3/uL (ref 0.1–1.0)
Monocytes Relative: 11 %
Neutro Abs: 4.8 10*3/uL (ref 1.7–7.7)
Neutrophils Relative %: 66 %
Platelet Count: 210 10*3/uL (ref 150–400)
RBC: 4.89 MIL/uL (ref 4.22–5.81)
RDW: 26.5 % — ABNORMAL HIGH (ref 11.5–15.5)
WBC Count: 7.2 10*3/uL (ref 4.0–10.5)
nRBC: 0 % (ref 0.0–0.2)

## 2022-12-31 LAB — CMP (CANCER CENTER ONLY)
ALT: 38 U/L (ref 0–44)
AST: 58 U/L — ABNORMAL HIGH (ref 15–41)
Albumin: 3.1 g/dL — ABNORMAL LOW (ref 3.5–5.0)
Alkaline Phosphatase: 91 U/L (ref 38–126)
Anion gap: 8 (ref 5–15)
BUN: 35 mg/dL — ABNORMAL HIGH (ref 8–23)
CO2: 27 mmol/L (ref 22–32)
Calcium: 8.8 mg/dL — ABNORMAL LOW (ref 8.9–10.3)
Chloride: 97 mmol/L — ABNORMAL LOW (ref 98–111)
Creatinine: 1.85 mg/dL — ABNORMAL HIGH (ref 0.61–1.24)
GFR, Estimated: 39 mL/min — ABNORMAL LOW (ref >60)
Glucose, Bld: 110 mg/dL — ABNORMAL HIGH (ref 70–99)
Potassium: 4.1 mmol/L (ref 3.5–5.1)
Sodium: 132 mmol/L — ABNORMAL LOW (ref 135–145)
Total Bilirubin: 1.4 mg/dL — ABNORMAL HIGH (ref 0.3–1.2)
Total Protein: 6.5 g/dL (ref 6.5–8.1)

## 2022-12-31 MED ORDER — MESALAMINE 1000 MG RE SUPP: 1000.0000 mg | Freq: Every day | RECTAL | 0 refills | Status: DC

## 2022-12-31 MED ORDER — MAGIC MOUTHWASH W/LIDOCAINE: 5.0000 mL | Freq: Four times a day (QID) | ORAL | 2 refills | Status: DC | PRN

## 2022-12-31 NOTE — Patient Instructions (Signed)
You have been scheduled for a CT scan of the abdomen and pelvis at [facility], 1st floor Radiology. You are scheduled on 01/14/2023 at :500pm. You should arrive 2 hours prior to your appointment time for registration.  We are giving you 2 bottles of contrast today that you will need to drink before arriving for the exam. Please follow the written instructions below on the day of your exam:   1) Do not eat anything 4 hours prior to your test   You may take any medications as prescribed with a small amount of water, if necessary. If you take any of the following medications: METFORMIN, GLUCOPHAGE, GLUCOVANCE, AVANDAMET, RIOMET, FORTAMET, ACTOPLUS MET, JANUMET, GLUMETZA or METAGLIP, you MAY be asked to HOLD this medication 48 hours AFTER the exam.   The purpose of you drinking the oral contrast is to aid in the visualization of your intestinal tract. The contrast solution may cause some diarrhea. Depending on your individual set of symptoms, you may also receive an intravenous injection of x-ray contrast/dye. Plan on being at [facility] for 45 minutes or longer, depending on the type of exam you are having performed.   If you have any questions regarding your exam or if you need to reschedule, you may call [facility] at [phone] between the hours of 8:00 am and 5:00 pm, Monday-Friday.   We have sent the following medications to your pharmacy for you to pick up at your convenience: Magic mouthwash Canasa _______________________________________________________  If your blood pressure at your visit was 140/90 or greater, please contact your primary care physician to follow up on this.  _______________________________________________________  If you are age 67 or older, your body mass index should be between 23-30. Your Body mass index is 27.72 kg/m. If this is out of the aforementioned range listed, please consider follow up with your Primary Care Provider.  If you are age 67 or younger, your body mass index should be between 19-25. Your Body mass index is 27.72 kg/m. If this is out of the aformentioned range listed, please consider follow up with your Primary Care Provider.   ________________________________________________________  The [facility] providers would like to encourage you to use [facility] to communicate with providers for non-urgent requests or questions.  Due to long hold times on the telephone, sending your provider a message by [facility] may be a faster and more efficient way to get a response.  Please allow 48 business hours for a response.  Please remember that this is for non-urgent requests.  _______________________________________________________ It was a pleasure to see you today!  Thank you for trusting me with your gastrointestinal care!

## 2022-12-31 NOTE — Addendum Note (Signed)
Addended by: Wandalee Ferdinand on: 12/31/2022 09:57 AM   Modules accepted: Orders

## 2022-12-31 NOTE — Progress Notes (Signed)
Matthew Chen OFFICE PROGRESS NOTE   Diagnosis: Hepatocellular carcinoma  INTERVAL HISTORY:   Matthew Chen returns as scheduled.  He continues cabozantinib.  He has developed increased diarrhea for the past several weeks.  He has approximately 4 diarrhea bowel movements per day.  He reports fecal incontinence.  He complains of burning when he swallows liquids and burning at the perineum.  He has occasional blood per rectum.  He has a poor appetite. Objective:  Vital signs in last 24 hours:  Blood pressure 115/82, pulse 88, temperature 98.1 F (36.7 C), temperature source Oral, resp. rate 18, height 5\' 10"  (1.778 m), weight 193 lb (87.5 kg), SpO2 100 %.    HEENT: No thrush or ulcers Resp: Clear bilaterally Cardio: Regular rate and rhythm GI: No hepatosplenomegaly, no mass, nontender Vascular: No leg edema  Skin: Linear superficial ulcer at the upper gluteal fold, erythema with excoriations at the perineum  Lab Results:  Lab Results  Component Value Date   WBC 7.2 12/31/2022   HGB 13.9 12/31/2022   HCT 41.3 12/31/2022   MCV 84.5 12/31/2022   PLT 210 12/31/2022   NEUTROABS 4.8 12/31/2022    CMP  Lab Results  Component Value Date   NA 136 12/24/2022   K 4.6 12/24/2022   CL 99 12/24/2022   CO2 22 12/24/2022   GLUCOSE 168 (H) 12/24/2022   BUN 31 (H) 12/24/2022   CREATININE 2.14 (H) 12/24/2022   CALCIUM 8.8 12/24/2022   PROT 6.2 12/24/2022   ALBUMIN 3.1 (L) 12/24/2022   AST 67 (H) 12/24/2022   ALT 49 (H) 12/24/2022   ALKPHOS 161 (H) 12/24/2022   BILITOT 0.9 12/24/2022   GFRNONAA 42 (L) 12/09/2022   GFRAA 49 (L) 11/20/2020    Lab Results  Component Value Date   CEA1 1.9 06/21/2021    Lab Results  Component Value Date   INR 1.2 (H) 12/13/2021   LABPROT 12.3 (H) 12/13/2021    Imaging:  MR Abdomen W Wo Contrast  Result Date: 12/27/2022 CLINICAL DATA:  Hepatocellular carcinoma restaging EXAM: MRI ABDOMEN WITHOUT AND WITH CONTRAST TECHNIQUE:  Multiplanar multisequence MR imaging of the abdomen was performed both before and after the administration of intravenous contrast. CONTRAST:  9 mL Vueway gadolinium contrast IV COMPARISON:  07/20/2022 FINDINGS: Lower chest: No acute abnormality. Hepatobiliary: Significant technical limitation of prior examination dated 07/20/2022 makes direct comparison somewhat difficult, however there is no obvious change in heterogeneously enhancing liver lesions, largest of the inferior right lobe of the liver, hepatic segments V/VI measuring at least 9.5 x 4.6 cm with overlying capsular retraction (series 11, image 44), and in the superior left lobe of the liver, hepatic segment II measuring 2.8 x 2.6 cm (series 11, image 13). Contracted gallbladder containing numerous tiny stones. Gallbladder wall thickening, or biliary dilatation. Pancreas: Unremarkable. No pancreatic ductal dilatation or surrounding inflammatory changes. Spleen: Normal in size without significant abnormality. Adrenals/Urinary Tract: Adrenal glands are unremarkable. Kidneys are normal, without renal calculi, solid lesion, or hydronephrosis. Stomach/Bowel: Stomach is within normal limits. No evidence of bowel wall thickening, distention, or inflammatory changes. Vascular/Lymphatic: No significant vascular findings are present. No enlarged abdominal lymph nodes. Other: No abdominal wall hernia or abnormality. No ascites. Musculoskeletal: No acute or significant osseous findings. IMPRESSION: 1. Significant technical limitation of prior examination dated 07/20/2022 makes direct comparison somewhat difficult, however there is no obvious change in heterogeneously enhancing liver lesions, largest of the inferior right lobe of the liver, hepatic segments V/VI, and in  the superior left lobe of the liver, hepatic segment II. Findings remain consistent with multifocal hepatocellular carcinoma. 2. Cholelithiasis without evidence of acute cholecystitis. Electronically  Signed   By: Matthew Chen M.D.   On: 12/27/2022 16:53    Medications: I have reviewed the patient's current medications.   Assessment/Plan:  Hepatocellular carcinoma CT angio chest aorta 01/30/2021-no evidence of thoracic aortic aneurysm, diffuse hepatic steatosis, suggestion of early cirrhosis, 7.1 cm enhancing masslike area in segment 5/6, prominent gastrohepatic and periportal nodes measuring up to 7 mm MRI liver 03/22/2021-arterial hyperenhancing subcapsular mass, segment 6, 7 x 5.1 cm, LI-RADS 5, no pathologically large lymph nodes Ultrasound-guided biopsy 04/24/2021-hepatocellular carcinoma Y90 radioembolization of segment 6 and segment 7 hepatic arterial branches 06/21/2021 MRI abdomen 09/03/2021-new increased enhancement inferior and superior to the dominant right liver lesion, new 11 mm enhancing lesion in the left liver LIRADS 3, dominant right hepatic lesion stable in size MRI abdomen 12/19/2021-unchanged hyperenhancing mass in the anterior inferior right liver measuring 7.8 x 5 cm, interval enlargement of a arterial enhancing lesion in segment 2- LIRADS 5, new small enhancing lesions hepatic segment 4A measuring 1.2 x 0.9 cm and 0.8 x 0.6 cm- LIRADS 5 Cycle 1 atezolizumab /bevacizumab 02/07/2022 Cycle 2 atezolizumab/bevacizumab 02/27/2022 Cycle 3 atezolizumab/bevacizumab 03/21/2022 Cycle 4 atezolizumab/bevacizumab 04/22/2022 MRI abdomen 05/05/2022-no change in dominant segment 6 lesion, no change in hyperenhancing lesions in segment 4A and segment 2, no new lesions Cycle 5 atezolizumab/bevacizumab 05/13/2022 Cycle 6 atezolizumab/bevacizumab 06/04/2022 Cycle 7 atezolizumab/bevacizumab 06/24/2022 Cycle 8 atezolizumab/bevacizumab 07/15/2022 MRI abdomen 07/20/2022-mild progression of bilateral liver lesions, no evidence of extrahepatic metastatic disease MRI abdomen [facility] 08/19/2022-increased size of the dominant admitted 5/6 lesion, now measuring 12.7 x 8.8 cm, multiple additional hepatic lesions  appear larger than on the previous exam, stable mildly enlarged upper abdominal lymph nodes Lenvatinib 09/09/2022, stopped 10/04/2022 Cabozantinib 10/11/2022 MRI abdomen 12/27/2022-technical limitation on the 07/20/2022 exam makes direct comparison difficult, notes significant change in the enhancing liver lesions Cabozantinib placed on hold 12/31/2022 Ulcerative colitis-maintained on vedolizumab Peripheral arterial disease History of a thoracic aortic aneurysm CAD Diabetes Hypertension Chronic renal failure Paroxysmal atrial fibrillation   Disposition: Mr. Allender has multifocal hepatocellular carcinoma.  He has multiple comorbid conditions.  The restaging MRI reveals no evidence of disease progression.  I reviewed the images with him.  He has increased diarrhea for the past several weeks.  He also has symptoms of stomatitis.  I suspect his symptoms are related to cabozantinib versus inflammatory bowel disease.  Cabozantinib will be placed on hold.  He will return for an office visit and reassessment next week.  He will begin a trial of Lomotil to use as needed for diarrhea.  He will hold HCTZ.  Anorexia may be related to cabozantinib versus subclinical disease progression.    Thornton Papas, MD  12/31/2022  8:56 AM

## 2023-01-01 ENCOUNTER — Other Ambulatory Visit (HOSPITAL_BASED_OUTPATIENT_CLINIC_OR_DEPARTMENT_OTHER): Payer: Self-pay

## 2023-01-01 LAB — GI PROFILE, STOOL, PCR

## 2023-01-02 ENCOUNTER — Telehealth: Payer: Self-pay

## 2023-01-02 ENCOUNTER — Other Ambulatory Visit: Payer: Self-pay | Admitting: Family Medicine

## 2023-01-02 ENCOUNTER — Inpatient Hospital Stay (HOSPITAL_COMMUNITY)
Admission: EM | Admit: 2023-01-02 | Discharge: 2023-01-06 | DRG: 683 | Disposition: A | Discharge status: Home / Self Care | Payer: PPO | Attending: Family Medicine | Admitting: Family Medicine

## 2023-01-02 DIAGNOSIS — R159 Full incontinence of feces: Secondary | ICD-10-CM | POA: Diagnosis present

## 2023-01-02 DIAGNOSIS — E785 Hyperlipidemia, unspecified: Secondary | ICD-10-CM | POA: Diagnosis present

## 2023-01-02 DIAGNOSIS — K91840 Postprocedural hemorrhage and hematoma of a digestive system organ or structure following a digestive system procedure: Secondary | ICD-10-CM | POA: Diagnosis not present

## 2023-01-02 DIAGNOSIS — Z7984 Long term (current) use of oral hypoglycemic drugs: Secondary | ICD-10-CM

## 2023-01-02 DIAGNOSIS — Z880 Allergy status to penicillin: Secondary | ICD-10-CM

## 2023-01-02 DIAGNOSIS — Y839 Surgical procedure, unspecified as the cause of abnormal reaction of the patient, or of later complication, without mention of misadventure at the time of the procedure: Secondary | ICD-10-CM | POA: Diagnosis not present

## 2023-01-02 DIAGNOSIS — K5190 Ulcerative colitis, unspecified, without complications: Secondary | ICD-10-CM | POA: Diagnosis present

## 2023-01-02 DIAGNOSIS — N179 Acute kidney failure, unspecified: Secondary | ICD-10-CM | POA: Diagnosis not present

## 2023-01-02 DIAGNOSIS — Z79620 Long term (current) use of immunosuppressive biologic: Secondary | ICD-10-CM

## 2023-01-02 DIAGNOSIS — C220 Liver cell carcinoma: Secondary | ICD-10-CM | POA: Diagnosis present

## 2023-01-02 DIAGNOSIS — Z7969 Long term (current) use of other immunomodulators and immunosuppressants: Secondary | ICD-10-CM

## 2023-01-02 DIAGNOSIS — Z8679 Personal history of other diseases of the circulatory system: Secondary | ICD-10-CM

## 2023-01-02 DIAGNOSIS — E1122 Type 2 diabetes mellitus with diabetic chronic kidney disease: Secondary | ICD-10-CM | POA: Diagnosis present

## 2023-01-02 DIAGNOSIS — K602 Anal fissure, unspecified: Secondary | ICD-10-CM | POA: Diagnosis present

## 2023-01-02 DIAGNOSIS — R197 Diarrhea, unspecified: Secondary | ICD-10-CM | POA: Diagnosis present

## 2023-01-02 DIAGNOSIS — Z800 Family history of malignant neoplasm of digestive organs: Secondary | ICD-10-CM

## 2023-01-02 DIAGNOSIS — I10 Essential (primary) hypertension: Secondary | ICD-10-CM | POA: Diagnosis present

## 2023-01-02 DIAGNOSIS — N170 Acute kidney failure with tubular necrosis: Principal | ICD-10-CM | POA: Diagnosis present

## 2023-01-02 DIAGNOSIS — E1169 Type 2 diabetes mellitus with other specified complication: Secondary | ICD-10-CM | POA: Diagnosis present

## 2023-01-02 DIAGNOSIS — Z7901 Long term (current) use of anticoagulants: Secondary | ICD-10-CM

## 2023-01-02 DIAGNOSIS — I2510 Atherosclerotic heart disease of native coronary artery without angina pectoris: Secondary | ICD-10-CM | POA: Diagnosis present

## 2023-01-02 DIAGNOSIS — I4819 Other persistent atrial fibrillation: Secondary | ICD-10-CM

## 2023-01-02 DIAGNOSIS — N1831 Chronic kidney disease, stage 3a: Secondary | ICD-10-CM | POA: Diagnosis present

## 2023-01-02 DIAGNOSIS — E1151 Type 2 diabetes mellitus with diabetic peripheral angiopathy without gangrene: Secondary | ICD-10-CM | POA: Diagnosis present

## 2023-01-02 DIAGNOSIS — I480 Paroxysmal atrial fibrillation: Secondary | ICD-10-CM | POA: Diagnosis present

## 2023-01-02 DIAGNOSIS — Z8619 Personal history of other infectious and parasitic diseases: Secondary | ICD-10-CM

## 2023-01-02 DIAGNOSIS — I951 Orthostatic hypotension: Secondary | ICD-10-CM | POA: Diagnosis not present

## 2023-01-02 DIAGNOSIS — E860 Dehydration: Secondary | ICD-10-CM | POA: Diagnosis not present

## 2023-01-02 DIAGNOSIS — T45515A Adverse effect of anticoagulants, initial encounter: Secondary | ICD-10-CM | POA: Diagnosis not present

## 2023-01-02 DIAGNOSIS — Z794 Long term (current) use of insulin: Secondary | ICD-10-CM

## 2023-01-02 DIAGNOSIS — Z7982 Long term (current) use of aspirin: Secondary | ICD-10-CM

## 2023-01-02 DIAGNOSIS — Z83719 Family history of colon polyps, unspecified: Secondary | ICD-10-CM

## 2023-01-02 DIAGNOSIS — K6289 Other specified diseases of anus and rectum: Secondary | ICD-10-CM

## 2023-01-02 DIAGNOSIS — I129 Hypertensive chronic kidney disease with stage 1 through stage 4 chronic kidney disease, or unspecified chronic kidney disease: Secondary | ICD-10-CM | POA: Diagnosis present

## 2023-01-02 DIAGNOSIS — N183 Chronic kidney disease, stage 3 unspecified: Secondary | ICD-10-CM | POA: Diagnosis present

## 2023-01-02 DIAGNOSIS — Z87891 Personal history of nicotine dependence: Secondary | ICD-10-CM

## 2023-01-02 DIAGNOSIS — D6832 Hemorrhagic disorder due to extrinsic circulating anticoagulants: Secondary | ICD-10-CM | POA: Diagnosis not present

## 2023-01-02 DIAGNOSIS — Z833 Family history of diabetes mellitus: Secondary | ICD-10-CM

## 2023-01-02 DIAGNOSIS — Z79899 Other long term (current) drug therapy: Secondary | ICD-10-CM

## 2023-01-02 DIAGNOSIS — Z8249 Family history of ischemic heart disease and other diseases of the circulatory system: Secondary | ICD-10-CM

## 2023-01-02 DIAGNOSIS — Z86010 Personal history of colonic polyps: Secondary | ICD-10-CM

## 2023-01-02 DIAGNOSIS — R32 Unspecified urinary incontinence: Secondary | ICD-10-CM | POA: Diagnosis present

## 2023-01-02 LAB — COMPREHENSIVE METABOLIC PANEL
ALT: 46 U/L — ABNORMAL HIGH (ref 0–44)
AST: 64 U/L — ABNORMAL HIGH (ref 15–41)
Albumin: 2.2 g/dL — ABNORMAL LOW (ref 3.5–5.0)
Alkaline Phosphatase: 101 U/L (ref 38–126)
Anion gap: 15 (ref 5–15)
BUN: 46 mg/dL — ABNORMAL HIGH (ref 8–23)
CO2: 18 mmol/L — ABNORMAL LOW (ref 22–32)
Calcium: 8.1 mg/dL — ABNORMAL LOW (ref 8.9–10.3)
Chloride: 96 mmol/L — ABNORMAL LOW (ref 98–111)
Creatinine, Ser: 2.56 mg/dL — ABNORMAL HIGH (ref 0.61–1.24)
GFR, Estimated: 27 mL/min — ABNORMAL LOW (ref >60)
Glucose, Bld: 169 mg/dL — ABNORMAL HIGH (ref 70–99)
Potassium: 4.1 mmol/L (ref 3.5–5.1)
Sodium: 129 mmol/L — ABNORMAL LOW (ref 135–145)
Total Bilirubin: 1.4 mg/dL — ABNORMAL HIGH (ref 0.3–1.2)
Total Protein: 6.2 g/dL — ABNORMAL LOW (ref 6.5–8.1)

## 2023-01-02 LAB — CBC
HCT: 39.8 % (ref 39.0–52.0)
Hemoglobin: 13.4 g/dL (ref 13.0–17.0)
MCH: 28.9 pg (ref 26.0–34.0)
MCHC: 33.7 g/dL (ref 30.0–36.0)
MCV: 85.8 fL (ref 80.0–100.0)
Platelets: 212 10*3/uL (ref 150–400)
RBC: 4.64 MIL/uL (ref 4.22–5.81)
RDW: 26.6 % — ABNORMAL HIGH (ref 11.5–15.5)
WBC: 5 10*3/uL (ref 4.0–10.5)
nRBC: 0 % (ref 0.0–0.2)

## 2023-01-02 LAB — CLOSTRIDIUM DIFFICILE BY PCR: Toxigenic C. Difficile by PCR: NEGATIVE

## 2023-01-02 LAB — TYPE AND SCREEN
ABO/RH(D): O POS
Antibody Screen: NEGATIVE

## 2023-01-02 MED ORDER — LACTATED RINGERS IV SOLN: INTRAVENOUS | Status: DC

## 2023-01-02 MED ORDER — LACTATED RINGERS IV BOLUS
1000.0000 mL | Freq: Once | INTRAVENOUS | Status: AC
  Administered 2023-01-02: 1000 mL via INTRAVENOUS

## 2023-01-02 NOTE — ED Provider Notes (Signed)
Care assumed from Dr. Anitra Lauth, patient with diarrhea and acute kidney injury, will need to be admitted.  Case is discussed with Dr. Julian Reil of [facility], who agrees to admit the patient.   Dione Booze, MD 01/03/23 [phone]

## 2023-01-02 NOTE — Telephone Encounter (Signed)
Pt called in to ask if the pain med that pcp mentioned at [facility] on 12/30/22 could be called into pharmacy please. Please advise.  Cb#: [id number]

## 2023-01-02 NOTE — ED Provider Notes (Signed)
[facility] DEPARTMENT AT [facility] Provider Note   CSN: [id number] Arrival date & time: 01/02/23  1811     History  Chief Complaint  Patient presents with   Weakness   Diarrhea    Matthew Chen is a 68 y.o. male.  Patient is a 67 year old male with a history of diabetes, hypertension, PAD, CKD, paroxysmal atrial fibrillation on Eliquis, hepatocellular carcinoma that had been taking oral chemotherapy which had been discontinued several weeks ago due to worsening diarrhea presenting today due to generalized weakness, low blood pressure and persistent diarrhea.  Patient states that despite stopping the medication he is still having between 5-10 liquid stools per day but occasionally will have some formed stool as well.  He complains of episodes where he loses control of his bowels and cannot make it to the bathroom, no blood in the stool.  He also complains of severe rectal pain from all the diarrhea.  He has not had specific abdominal pain has had occasional nausea but not excessive vomiting.  No fevers.  He has had decrease in his appetite and for the last few days has not been eating or drinking much.  He attempted to go to his family member's graduation today and when he tried to get out of the car he felt very lightheaded and weak.  They checked his blood pressure and it was in the 80s systolic.  He is still taking his blood pressure medications.  He has been to his cancer doctor, GI doctor and PCP and nothing they have tried thus far has gotten the diarrhea to slow down.  He has not been on any antibiotics recently.  The history is provided by the patient, medical records and the spouse.  Weakness Associated symptoms: diarrhea   Diarrhea      Home Medications Prior to Admission medications   Medication Sig Start Date End Date Taking? Authorizing Provider  acetaminophen (TYLENOL) 500 MG tablet Take 1 tablet (500 mg total) by mouth every 6 (six) hours as needed.  03/27/22   Derwood Kaplan, MD  amiodarone (PACERONE) 200 MG tablet Take 1 tablet (200 mg total) by mouth daily. 12/24/22   Graciella Freer, [name]  apixaban (ELIQUIS) 5 MG TABS tablet Take 1 tablet (5 mg total) by mouth 2 (two) times daily. 10/30/22   Joylene Grapes, NP  aspirin EC 81 MG tablet Take 1 tablet (81 mg total) by mouth daily. 06/01/15   Chilton Si, MD  atorvastatin (LIPITOR) 80 MG tablet Take 1 tablet (80 mg total) by mouth daily. 10/30/22   Joylene Grapes, NP  carvedilol (COREG) 12.5 MG tablet Take 1 tablet (12.5 mg total) by mouth 2 (two) times daily with a meal. 12/18/22   Donita Brooks, MD  cyanocobalamin (VITAMIN B12) 1000 MCG tablet Take 1,000 mcg by mouth daily.    [provider]  diltiazem (CARDIZEM) 30 MG tablet Take 1 tablet (30 mg total) by mouth 3 (three) times daily as needed (palpitations). 10/30/22 10/30/23  Joylene Grapes, NP  diphenoxylate-atropine (LOMOTIL) 2.5-0.025 MG tablet Take 1 tablet by mouth in the morning and at bedtime. 12/30/22   Donita Brooks, MD  empagliflozin (JARDIANCE) 10 MG TABS tablet Take 1 tablet (10 mg total) by mouth daily. 10/30/22   Joylene Grapes, NP  fenofibrate 160 MG tablet Take 1 tablet (160 mg total) by mouth daily. 03/22/22   Donita Brooks, MD  glucose blood (ONETOUCH ULTRA) test strip CHECK BLOOD  SUGAR TWICE A DAY 03/19/21   Donita Brooks, MD  Insulin Pen Needle (B-D UF III MINI PEN NEEDLES) 31G X 5 MM MISC Use daily with Victoza 05/31/22   Donita Brooks, MD  lisinopril (ZESTRIL) 20 MG tablet Take 1 tablet (20 mg total) by mouth daily. 10/30/22   Joylene Grapes, NP  magic mouthwash w/lidocaine SOLN Take 5 mLs by mouth every 6 (six) hours as needed for mouth pain. 12/31/22   Doree Albee, [name]  mesalamine (CANASA) 1000 MG suppository Place 1 suppository (1,000 mg total) rectally at bedtime. 12/31/22   Doree Albee, [name]  Omega-3 Fatty Acids (FISH OIL) 1000 MG CAPS Take 1,000 mg by mouth 2 (two) times  daily.    [provider]  pantoprazole (PROTONIX) 40 MG tablet Take 1 tablet (40 mg total) by mouth 2 (two) times daily. 03/22/22   Donita Brooks, MD  pioglitazone (ACTOS) 30 MG tablet TAKE 1 TABLET BY MOUTH EVERY DAY. STRENGTH: 30 MG 08/26/22   Donita Brooks, MD  sildenafil (VIAGRA) 100 MG tablet Take 0.5-1 tablets (50-100 mg total) by mouth daily as needed for erectile dysfunction. 05/30/22   Donita Brooks, MD  sitaGLIPtin (JANUVIA) 100 MG tablet Take 1 tablet (100 mg total) by mouth daily. 11/05/22   Park Meo, FNP  vedolizumab (ENTYVIO) 300 MG injection Inject 300 mg as directed See admin instructions. Every 2 months    [provider]      Allergies    Penicillins    Review of Systems   Review of Systems  Gastrointestinal:  Positive for diarrhea.  Neurological:  Positive for weakness.    Physical Exam Updated Vital Signs BP 122/80   Pulse 65   Temp 98 F (36.7 C)   Resp 15   SpO2 99%  Physical Exam Vitals and nursing note reviewed.  Constitutional:      General: He is not in acute distress.    Appearance: He is well-developed. He is ill-appearing.  HENT:     Head: Normocephalic and atraumatic.     Mouth/Throat:     Mouth: Mucous membranes are dry.  Eyes:     Conjunctiva/sclera: Conjunctivae normal.     Pupils: Pupils are equal, round, and reactive to light.  Cardiovascular:     Rate and Rhythm: Normal rate and regular rhythm.     Heart sounds: No murmur heard. Pulmonary:     Effort: Pulmonary effort is normal. No respiratory distress.     Breath sounds: Normal breath sounds. No wheezing or rales.  Abdominal:     General: There is no distension.     Palpations: Abdomen is soft.     Tenderness: There is no abdominal tenderness. There is no guarding or rebound.  Musculoskeletal:        General: No tenderness. Normal range of motion.     Cervical back: Normal range of motion and neck supple.  Skin:    General: Skin is warm and dry.      Coloration: Skin is pale.     Findings: No erythema or rash.  Neurological:     Mental Status: He is alert and oriented to person, place, and time. Mental status is at baseline.  Psychiatric:        Behavior: Behavior normal.     ED Results / Procedures / Treatments   Labs (all labs ordered are listed, but only abnormal results are displayed) Labs Reviewed  COMPREHENSIVE METABOLIC PANEL -  Abnormal; Notable for the following components:      Result Value   Sodium 129 (*)    Chloride 96 (*)    CO2 18 (*)    Glucose, Bld 169 (*)    BUN 46 (*)    Creatinine, Ser 2.56 (*)    Calcium 8.1 (*)    Total Protein 6.2 (*)    Albumin 2.2 (*)    AST 64 (*)    ALT 46 (*)    Total Bilirubin 1.4 (*)    GFR, Estimated 27 (*)    All other components within normal limits  CBC - Abnormal; Notable for the following components:   RDW 26.6 (*)    All other components within normal limits  MAGNESIUM  POC OCCULT BLOOD, ED  TYPE AND SCREEN    EKG EKG Interpretation  Date/Time:  Thursday Jan 02 2023 18:37:25 EDT Ventricular Rate:  71 PR Interval:  168 QRS Duration: 84 QT Interval:  420 QTC Calculation: 456 R Axis:   -9 Text Interpretation: Sinus rhythm with Premature atrial complexes Otherwise normal ECG When compared with ECG of 07-Oct-2022 08:09, PREVIOUS ECG IS PRESENT Confirmed by Gwyneth Sprout ([id number]) on 01/02/2023 9:46:15 PM  Radiology No results found.  Procedures Procedures    Medications Ordered in ED Medications  lactated ringers infusion (has no administration in time range)  lactated ringers bolus 1,000 mL (1,000 mLs Intravenous New Bag/Given 01/02/23 2213)    ED Course/ Medical Decision Making/ A&P                             Medical Decision Making Amount and/or Complexity of Data Reviewed Independent Historian: spouse External Data Reviewed: notes. Labs: ordered. Decision-making details documented in ED Course. ECG/medicine tests: ordered and  independent interpretation performed. Decision-making details documented in ED Course.  Risk Decision regarding hospitalization.   Pt with multiple medical problems and comorbidities and presenting today with a complaint that caries a high risk for morbidity and mortality.  Here today with persistent diarrhea and low blood pressure.  Concern for dehydration, AKI, electrolyte abnormalities.  Patient has no focal abdominal pain concerning for an acute complication of his hepatocellular carcinoma.  He denies any blood in his stool and lower suspicion for GI bleed but will ensure patient is not anemic.  He denies any chest pain or shortness of breath.  I independently interpreted patient's labs and EKG.  EKG shows no acute changes.  CBC in normal limits with stable hemoglobin of 13 and normal white count, CMP with evidence of AKI with creatinine of 2.56 from 2 days ago 1.8, new hyponatremia of 129, anion gap of 15 and LFTs at baseline with an AST of 64 and ALT of 46.  Discussed results with the patient and his family.  Will give IV fluids and reassess.  11:34 PM Blood pressure has improved with IV fluids but patient is still feeling weak.  Given above findings we will admit for ongoing fluid due to AKI and dehydration.  Patient and his family are comfortable with this plan.  Will consult hospitalist for admission.         Final Clinical Impression(s) / ED Diagnoses Final diagnoses:  Dehydration  Orthostatic hypotension  AKI (acute kidney injury) [facility])    Rx / DC Orders ED Discharge Orders     None         Gwyneth Sprout, MD 01/02/23 2335

## 2023-01-02 NOTE — ED Triage Notes (Addendum)
Pt with hx of liver cancer c/o weakness and diarrhea ongoing for three weeks. Pt reports that he has lost approximately 8 lbs in that time. Pt states that he does see some bright red blood, only when wiping. Chemo drugs stopped earlier in the week d/t symptoms. Pt hypotensive in triage at 85/64 during triage. States that his BP is labile and can sometimes by 90's/70's. Pt is anticoagulated on Eliquis.

## 2023-01-03 ENCOUNTER — Observation Stay (HOSPITAL_COMMUNITY): Payer: PPO

## 2023-01-03 ENCOUNTER — Ambulatory Visit: Payer: Self-pay

## 2023-01-03 ENCOUNTER — Other Ambulatory Visit: Payer: Self-pay | Admitting: Family Medicine

## 2023-01-03 ENCOUNTER — Encounter (HOSPITAL_COMMUNITY): Payer: Self-pay | Admitting: Internal Medicine

## 2023-01-03 ENCOUNTER — Telehealth: Payer: Self-pay

## 2023-01-03 ENCOUNTER — Encounter (HOSPITAL_COMMUNITY)
Admission: EM | Disposition: A | Discharge status: Home / Self Care | Payer: Self-pay | Source: Home / Self Care | Attending: Family Medicine

## 2023-01-03 ENCOUNTER — Other Ambulatory Visit: Payer: Self-pay

## 2023-01-03 DIAGNOSIS — K91840 Postprocedural hemorrhage and hematoma of a digestive system organ or structure following a digestive system procedure: Secondary | ICD-10-CM | POA: Diagnosis not present

## 2023-01-03 DIAGNOSIS — E860 Dehydration: Secondary | ICD-10-CM | POA: Diagnosis not present

## 2023-01-03 DIAGNOSIS — K6289 Other specified diseases of anus and rectum: Secondary | ICD-10-CM

## 2023-01-03 DIAGNOSIS — I959 Hypotension, unspecified: Secondary | ICD-10-CM

## 2023-01-03 DIAGNOSIS — Z7982 Long term (current) use of aspirin: Secondary | ICD-10-CM | POA: Diagnosis not present

## 2023-01-03 DIAGNOSIS — I129 Hypertensive chronic kidney disease with stage 1 through stage 4 chronic kidney disease, or unspecified chronic kidney disease: Secondary | ICD-10-CM | POA: Diagnosis not present

## 2023-01-03 DIAGNOSIS — N1831 Chronic kidney disease, stage 3a: Secondary | ICD-10-CM | POA: Diagnosis not present

## 2023-01-03 DIAGNOSIS — Z7969 Long term (current) use of other immunomodulators and immunosuppressants: Secondary | ICD-10-CM | POA: Diagnosis not present

## 2023-01-03 DIAGNOSIS — Z7984 Long term (current) use of oral hypoglycemic drugs: Secondary | ICD-10-CM | POA: Diagnosis not present

## 2023-01-03 DIAGNOSIS — I951 Orthostatic hypotension: Secondary | ICD-10-CM | POA: Diagnosis not present

## 2023-01-03 DIAGNOSIS — Z7901 Long term (current) use of anticoagulants: Secondary | ICD-10-CM | POA: Diagnosis not present

## 2023-01-03 DIAGNOSIS — E1122 Type 2 diabetes mellitus with diabetic chronic kidney disease: Secondary | ICD-10-CM | POA: Diagnosis not present

## 2023-01-03 DIAGNOSIS — I2510 Atherosclerotic heart disease of native coronary artery without angina pectoris: Secondary | ICD-10-CM | POA: Diagnosis not present

## 2023-01-03 DIAGNOSIS — K5190 Ulcerative colitis, unspecified, without complications: Secondary | ICD-10-CM | POA: Diagnosis not present

## 2023-01-03 DIAGNOSIS — Z79899 Other long term (current) drug therapy: Secondary | ICD-10-CM | POA: Diagnosis not present

## 2023-01-03 DIAGNOSIS — N170 Acute kidney failure with tubular necrosis: Secondary | ICD-10-CM | POA: Diagnosis not present

## 2023-01-03 DIAGNOSIS — N1832 Chronic kidney disease, stage 3b: Secondary | ICD-10-CM

## 2023-01-03 DIAGNOSIS — E1151 Type 2 diabetes mellitus with diabetic peripheral angiopathy without gangrene: Secondary | ICD-10-CM | POA: Diagnosis not present

## 2023-01-03 DIAGNOSIS — I10 Essential (primary) hypertension: Secondary | ICD-10-CM

## 2023-01-03 DIAGNOSIS — Y839 Surgical procedure, unspecified as the cause of abnormal reaction of the patient, or of later complication, without mention of misadventure at the time of the procedure: Secondary | ICD-10-CM | POA: Diagnosis not present

## 2023-01-03 DIAGNOSIS — C220 Liver cell carcinoma: Secondary | ICD-10-CM | POA: Diagnosis not present

## 2023-01-03 DIAGNOSIS — R32 Unspecified urinary incontinence: Secondary | ICD-10-CM | POA: Diagnosis not present

## 2023-01-03 DIAGNOSIS — N179 Acute kidney failure, unspecified: Secondary | ICD-10-CM

## 2023-01-03 DIAGNOSIS — Z794 Long term (current) use of insulin: Secondary | ICD-10-CM | POA: Diagnosis not present

## 2023-01-03 DIAGNOSIS — K602 Anal fissure, unspecified: Secondary | ICD-10-CM | POA: Diagnosis not present

## 2023-01-03 DIAGNOSIS — E1169 Type 2 diabetes mellitus with other specified complication: Secondary | ICD-10-CM | POA: Diagnosis not present

## 2023-01-03 DIAGNOSIS — K626 Ulcer of anus and rectum: Secondary | ICD-10-CM | POA: Diagnosis not present

## 2023-01-03 DIAGNOSIS — K625 Hemorrhage of anus and rectum: Secondary | ICD-10-CM | POA: Diagnosis not present

## 2023-01-03 DIAGNOSIS — D6832 Hemorrhagic disorder due to extrinsic circulating anticoagulants: Secondary | ICD-10-CM | POA: Diagnosis not present

## 2023-01-03 DIAGNOSIS — E785 Hyperlipidemia, unspecified: Secondary | ICD-10-CM | POA: Diagnosis not present

## 2023-01-03 DIAGNOSIS — I480 Paroxysmal atrial fibrillation: Secondary | ICD-10-CM

## 2023-01-03 DIAGNOSIS — Z79620 Long term (current) use of immunosuppressive biologic: Secondary | ICD-10-CM | POA: Diagnosis not present

## 2023-01-03 DIAGNOSIS — K51919 Ulcerative colitis, unspecified with unspecified complications: Secondary | ICD-10-CM | POA: Diagnosis not present

## 2023-01-03 DIAGNOSIS — R197 Diarrhea, unspecified: Secondary | ICD-10-CM | POA: Diagnosis present

## 2023-01-03 HISTORY — PX: BIOPSY: SHX5522

## 2023-01-03 HISTORY — PX: FLEXIBLE SIGMOIDOSCOPY: SHX5431

## 2023-01-03 LAB — GLUCOSE, CAPILLARY
Glucose-Capillary: 89 mg/dL (ref 70–99)
Glucose-Capillary: 80 mg/dL (ref 70–99)
Glucose-Capillary: 92 mg/dL (ref 70–99)
Glucose-Capillary: 99 mg/dL (ref 70–99)
Glucose-Capillary: 158 mg/dL — ABNORMAL HIGH (ref 70–99)

## 2023-01-03 LAB — BASIC METABOLIC PANEL
Anion gap: 12 (ref 5–15)
BUN: 41 mg/dL — ABNORMAL HIGH (ref 8–23)
CO2: 21 mmol/L — ABNORMAL LOW (ref 22–32)
Calcium: 7.8 mg/dL — ABNORMAL LOW (ref 8.9–10.3)
Chloride: 99 mmol/L (ref 98–111)
Creatinine, Ser: 2.05 mg/dL — ABNORMAL HIGH (ref 0.61–1.24)
GFR, Estimated: 35 mL/min — ABNORMAL LOW (ref >60)
Glucose, Bld: 86 mg/dL (ref 70–99)
Potassium: 3.9 mmol/L (ref 3.5–5.1)
Sodium: 132 mmol/L — ABNORMAL LOW (ref 135–145)

## 2023-01-03 LAB — CBG MONITORING, ED: Glucose-Capillary: 93 mg/dL (ref 70–99)

## 2023-01-03 LAB — CBC
HCT: 38.2 % — ABNORMAL LOW (ref 39.0–52.0)
Hemoglobin: 12.5 g/dL — ABNORMAL LOW (ref 13.0–17.0)
MCH: 28.6 pg (ref 26.0–34.0)
MCHC: 32.7 g/dL (ref 30.0–36.0)
MCV: 87.4 fL (ref 80.0–100.0)
Platelets: 163 10*3/uL (ref 150–400)
RBC: 4.37 MIL/uL (ref 4.22–5.81)
RDW: 26.5 % — ABNORMAL HIGH (ref 11.5–15.5)
WBC: 4.5 10*3/uL (ref 4.0–10.5)
nRBC: 0 % (ref 0.0–0.2)

## 2023-01-03 LAB — MAGNESIUM: Magnesium: 2.3 mg/dL (ref 1.7–2.4)

## 2023-01-03 SURGERY — SIGMOIDOSCOPY, FLEXIBLE

## 2023-01-03 MED ORDER — AMBULATORY NON FORMULARY MEDICATION: 1 refills | Status: DC

## 2023-01-03 MED ORDER — APIXABAN 5 MG PO TABS
5.0000 mg | ORAL_TABLET | Freq: Two times a day (BID) | ORAL | Status: DC
  Administered 2023-01-03 – 2023-01-04 (×4): 5 mg via ORAL
  Filled 2023-01-03 (×4): qty 1

## 2023-01-03 MED ORDER — HYDROCORTISONE (PERIANAL) 2.5 % EX CREA
1.0000 | TOPICAL_CREAM | Freq: Four times a day (QID) | CUTANEOUS | Status: DC | PRN
  Administered 2023-01-04: 1 via TOPICAL
  Filled 2023-01-03 (×2): qty 28.35

## 2023-01-03 MED ORDER — ONDANSETRON HCL 4 MG/2ML IJ SOLN: 4.0000 mg | Freq: Four times a day (QID) | INTRAMUSCULAR | Status: DC | PRN

## 2023-01-03 MED ORDER — DIPHENOXYLATE-ATROPINE 2.5-0.025 MG PO TABS
1.0000 | ORAL_TABLET | Freq: Two times a day (BID) | ORAL | Status: DC
  Administered 2023-01-03 – 2023-01-04 (×3): 1 via ORAL
  Filled 2023-01-03 (×3): qty 1

## 2023-01-03 MED ORDER — ONDANSETRON HCL 4 MG PO TABS: 4.0000 mg | ORAL_TABLET | Freq: Four times a day (QID) | ORAL | Status: DC | PRN

## 2023-01-03 MED ORDER — LIDOCAINE VISCOUS HCL 2 % MT SOLN
OROMUCOSAL | Status: AC
  Filled 2023-01-03: qty 15

## 2023-01-03 MED ORDER — LIDOCAINE VISCOUS HCL 2 % MT SOLN
OROMUCOSAL | Status: DC | PRN
  Administered 2023-01-03: 8 mL

## 2023-01-03 MED ORDER — LIDOCAINE-MENTHOL (SPRAY) 4-1 % EX LIQD: 1.0000 | CUTANEOUS | 1 refills | Status: DC | PRN

## 2023-01-03 MED ORDER — HEPARIN SODIUM (PORCINE) 5000 UNIT/ML IJ SOLN: 5000.0000 [IU] | Freq: Three times a day (TID) | INTRAMUSCULAR | Status: DC

## 2023-01-03 MED ORDER — INSULIN ASPART 100 UNIT/ML IJ SOLN: 0.0000 [IU] | Freq: Three times a day (TID) | INTRAMUSCULAR | Status: DC

## 2023-01-03 MED ORDER — INSULIN ASPART 100 UNIT/ML IJ SOLN: 0.0000 [IU] | INTRAMUSCULAR | Status: DC

## 2023-01-03 MED ORDER — PANTOPRAZOLE SODIUM 40 MG PO TBEC
40.0000 mg | DELAYED_RELEASE_TABLET | Freq: Two times a day (BID) | ORAL | Status: DC
  Administered 2023-01-03 – 2023-01-06 (×8): 40 mg via ORAL
  Filled 2023-01-03 (×8): qty 1

## 2023-01-03 MED ORDER — ACETAMINOPHEN 650 MG RE SUPP: 650.0000 mg | Freq: Four times a day (QID) | RECTAL | Status: DC | PRN

## 2023-01-03 MED ORDER — ALUM & MAG HYDROXIDE-SIMETH 200-200-20 MG/5ML PO SUSP: 30.0000 mL | ORAL | Status: DC | PRN

## 2023-01-03 MED ORDER — ASPIRIN 81 MG PO TBEC
81.0000 mg | DELAYED_RELEASE_TABLET | Freq: Every day | ORAL | Status: DC
  Administered 2023-01-03 – 2023-01-04 (×2): 81 mg via ORAL
  Filled 2023-01-03 (×2): qty 1

## 2023-01-03 MED ORDER — AMIODARONE HCL 200 MG PO TABS
200.0000 mg | ORAL_TABLET | Freq: Every day | ORAL | Status: DC
  Administered 2023-01-03 – 2023-01-06 (×4): 200 mg via ORAL
  Filled 2023-01-03 (×4): qty 1

## 2023-01-03 MED ORDER — OXYCODONE HCL 5 MG PO TABS: 5.0000 mg | ORAL_TABLET | ORAL | 0 refills | Status: DC | PRN

## 2023-01-03 MED ORDER — ACETAMINOPHEN 325 MG PO TABS
650.0000 mg | ORAL_TABLET | Freq: Four times a day (QID) | ORAL | Status: DC | PRN
  Administered 2023-01-04 – 2023-01-05 (×2): 650 mg via ORAL
  Filled 2023-01-03 (×2): qty 2

## 2023-01-03 MED ORDER — ATORVASTATIN CALCIUM 80 MG PO TABS
80.0000 mg | ORAL_TABLET | Freq: Every day | ORAL | Status: DC
  Administered 2023-01-03 – 2023-01-06 (×4): 80 mg via ORAL
  Filled 2023-01-03 (×4): qty 1

## 2023-01-03 MED ORDER — LOPERAMIDE HCL 2 MG PO CAPS: 2.0000 mg | ORAL_CAPSULE | ORAL | Status: DC | PRN

## 2023-01-03 MED ORDER — LACTATED RINGERS IV BOLUS
1000.0000 mL | Freq: Once | INTRAVENOUS | Status: AC
  Administered 2023-01-03: 1000 mL via INTRAVENOUS

## 2023-01-03 NOTE — Consult Note (Signed)
Consultation  Referring Provider:  [facility]  Primary Care Physician:  Donita Brooks, MD Primary Gastroenterologist:  Dr. Russella Dar       Reason for Consultation:     Diarrhea, history of ulcerative colitis  LOS: 0 days          HPI:   Matthew Chen is a 67 y.o. male with past medical history significant for CKD stage III, DM type II, HCC on chemo with carbometyx and Entyvio (stopped 12/2020), ulcerative colitis (diagnosed in 2002), A-fib on Eliquis presents for evaluation of persistent diarrhea.  Has had ongoing diarrhea for the last several weeks.  Seen by Quentin Mulling, [name] in office 5/21.  Thought to be ulcerative colitis flare versus a side effect from [facility] med carbometyx. At that time C. difficile and GI pathogen panel was negative. MRI 5/17 shows no evidence of bowel inflammation Labs drawn 5/20 show sed rate 75 and CRP 80.78.  Vedolizumab antibody pending  Patient presents to ED after feeling lightheaded/weak while getting out of a car.  Blood pressure was in the 80s systolic (still taking antihypertensives).  Patient states he has had persistent diarrhea over the last 3 weeks.  Ranging from 5-10 times per day.  Consistency can be anywhere from pure liquid with chunks to a "paste" appearance.  He will intermittently have small amount of bright red blood on the tissue paper with wiping, rarely in the stool.  States when he has bowel movements he has severe burning rectal pain which has gotten progressively worse.  Also endorses fecal incontinence.  Has always had intermittent diarrhea over the years, however, this time it has been persistent and getting progressively worse.  Has tried Imodium without relief.  No relief after getting taken off of carbomytx  Wife states patient has lost 8 pounds over the course of 2 weeks and is not eating well.  Patient denies nausea/vomiting.  Patient denies abdominal pain.  Patient is due for next Entyvio infusion Tuesday 5/28.  Last infusion was 8  weeks prior.  Upon admission CT pelvis without contrast shows no bowel dilatation or active inflammation in the pelvis  Hgb 12.5, WBC 14.5.  Sodium 132, potassium 3.9.  BUN 41, creatinine 2.05, GFR 35  Last colonoscopy 07/11/2021 by Dr. Russella Dar: 5 mm inflammatory polyp in transverse colon, erythematous mucosa in rectum, sigmoid colon, and descending colon (biopsied as chronic inactive colitis).  Past Medical History:  Diagnosis Date   Adenomatous colon polyp 03/2008   Anginal pain (HCC)    Aortic aneurysm, thoracic (HCC) 09/09/2016   4.4 cm by echo 05/2015   Arthritis    "joints in my fingers ache" (07/13/2015)   Bicuspid aortic valve 09/09/2016   Cataract 2019   bilateral eyes   CKD (chronic kidney disease), stage III (HCC)    Coronary artery disease    Diabetes mellitus without complication (HCC)    Hemorrhoids    Hepatocellular carcinoma (HCC)    Hyperlipidemia    Hypertension    Paroxysmal atrial fibrillation (HCC)    Peripheral arterial disease (HCC)    a. dopppler 05/29/2015 revealed high-grade right popliteal and distal left SFA disease b. LE angio 06/28/2015 revealing high grade calcific dx with distal L SFA and R popliteal artery s/p diamondback orbital rotational atherectomy, PTA of L SFA  c. 07/13/2015 diamondback orbital rotational atherectomy and drug eluting balloon angioplasty of R popliteal artery (P2 segment) using distal protection    Type II diabetes mellitus (HCC)  Ulcerative colitis     Surgical History:  He  has a past surgical history that includes Cosmetic surgery (< 2000); Colonoscopy w/ polypectomy (X 4-5); Cardiac catheterization (N/A, 06/29/2015); Tonsillectomy; Cardiac catheterization (N/A, 07/13/2015); Cardiac catheterization (07/13/2015); ABDOMINAL AORTOGRAM W/LOWER EXTREMITY (N/A, 04/27/2018); PERIPHERAL VASCULAR ATHERECTOMY (04/27/2018); PERIPHERAL VASCULAR INTERVENTION (04/27/2018); LEFT HEART CATH AND CORONARY ANGIOGRAPHY (N/A, 02/08/2021); CORONARY  ATHERECTOMY (N/A, 02/15/2021); CORONARY BALLOON ANGIOPLASTY (N/A, 02/15/2021); IR Radiologist Eval & Mgmt (05/08/2021); IR US Guide Vasc Access Right (06/08/2021); IR Angiogram Selective Each Additional Vessel (06/08/2021); IR EMBO ARTERIAL NOT HEMORR HEMANG INC GUIDE ROADMAPPING (06/08/2021); IR Angiogram Visceral Selective (06/08/2021); IR Angiogram Selective Each Additional Vessel (06/08/2021); IR Angiogram Selective Each Additional Vessel (06/08/2021); IR Angiogram Selective Each Additional Vessel (06/08/2021); IR Angiogram Visceral Selective (06/21/2021); IR Angiogram Selective Each Additional Vessel (06/21/2021); IR EMBO TUMOR ORGAN ISCHEMIA INFARCT INC GUIDE ROADMAPPING (06/21/2021); IR US Guide Vasc Access Left (06/21/2021); IR Angiogram Selective Each Additional Vessel (06/21/2021); IR EMBO TUMOR ORGAN ISCHEMIA INFARCT INC GUIDE ROADMAPPING (06/21/2021); IR Angiogram Selective Each Additional Vessel (06/21/2021); IR Angiogram Selective Each Additional Vessel (06/21/2021); Colonoscopy; Polypectomy; Upper gastrointestinal endoscopy; IR Radiologist Eval & Mgmt (07/26/2021); IR Radiologist Eval & Mgmt (09/05/2021); and IR Radiologist Eval & Mgmt (12/27/2021). Family History:  His family history includes Colon cancer in his maternal grandmother; Colon polyps in his mother; Diabetes in his maternal aunt and mother; Heart disease in his father. Social History:   reports that he quit smoking about 26 years ago. His smoking use included cigarettes. He has a 37.50 pack-year smoking history. He has never used smokeless tobacco. He reports current alcohol use of about 1.0 standard drink of alcohol per week. He reports that he does not use drugs.  Prior to Admission medications   Medication Sig Start Date End Date Taking? Authorizing Provider  acetaminophen (TYLENOL) 500 MG tablet Take 1 tablet (500 mg total) by mouth every 6 (six) hours as needed. Patient taking differently: Take 500 mg by mouth every 6 (six)  hours as needed for moderate pain. 03/27/22  Yes Derwood Kaplan, MD  amiodarone (PACERONE) 200 MG tablet Take 1 tablet (200 mg total) by mouth daily. 12/24/22  Yes Graciella Freer, [name]  apixaban (ELIQUIS) 5 MG TABS tablet Take 1 tablet (5 mg total) by mouth 2 (two) times daily. 10/30/22  Yes Joylene Grapes, NP  aspirin EC 81 MG tablet Take 1 tablet (81 mg total) by mouth daily. 06/01/15  Yes Chilton Si, MD  atorvastatin (LIPITOR) 80 MG tablet Take 1 tablet (80 mg total) by mouth daily. 10/30/22  Yes Monge, Petra Kuba, NP  carvedilol (COREG) 12.5 MG tablet Take 1 tablet (12.5 mg total) by mouth 2 (two) times daily with a meal. 12/18/22  Yes Donita Brooks, MD  cyanocobalamin (VITAMIN B12) 1000 MCG tablet Take 1,000 mcg by mouth daily.   Yes [provider]  diphenoxylate-atropine (LOMOTIL) 2.5-0.025 MG tablet Take 1 tablet by mouth in the morning and at bedtime. 12/30/22  Yes Donita Brooks, MD  empagliflozin (JARDIANCE) 10 MG TABS tablet Take 1 tablet (10 mg total) by mouth daily. 10/30/22  Yes Monge, Petra Kuba, NP  fenofibrate 160 MG tablet Take 1 tablet (160 mg total) by mouth daily. 03/22/22  Yes Donita Brooks, MD  lisinopril (ZESTRIL) 20 MG tablet Take 1 tablet (20 mg total) by mouth daily. 10/30/22  Yes Monge, Petra Kuba, NP  magic mouthwash w/lidocaine SOLN Take 5 mLs by mouth every 6 (six) hours as needed for mouth  pain. 12/31/22  Yes Doree Albee, [name]  Omega-3 Fatty Acids (FISH OIL) 1000 MG CAPS Take 1,000 mg by mouth 2 (two) times daily.   Yes [provider]  pantoprazole (PROTONIX) 40 MG tablet Take 1 tablet (40 mg total) by mouth 2 (two) times daily. 03/22/22  Yes Donita Brooks, MD  pioglitazone (ACTOS) 30 MG tablet TAKE 1 TABLET BY MOUTH EVERY DAY. STRENGTH: 30 MG Patient taking differently: Take 30 mg by mouth daily. 08/26/22  Yes Donita Brooks, MD  sildenafil (VIAGRA) 100 MG tablet Take 0.5-1 tablets (50-100 mg total) by mouth daily as needed for  erectile dysfunction. 05/30/22  Yes Donita Brooks, MD  sitaGLIPtin (JANUVIA) 100 MG tablet Take 1 tablet (100 mg total) by mouth daily. 11/05/22  Yes Howard, Amber S, FNP  vedolizumab (ENTYVIO) 300 MG injection Inject 300 mg as directed See admin instructions. Every 2 months   Yes [provider]  diltiazem (CARDIZEM) 30 MG tablet Take 1 tablet (30 mg total) by mouth 3 (three) times daily as needed (palpitations). Patient not taking: Reported on 01/02/2023 10/30/22 10/30/23  Joylene Grapes, NP  glucose blood (ONETOUCH ULTRA) test strip CHECK BLOOD SUGAR TWICE A DAY 03/19/21   Donita Brooks, MD  Insulin Pen Needle (B-D UF III MINI PEN NEEDLES) 31G X 5 MM MISC Use daily with Victoza 05/31/22   Donita Brooks, MD  Lidocaine-Menthol, Spray, 4-1 % LIQD Apply 1 spray topically as needed. 01/03/23   Donita Brooks, MD  mesalamine (CANASA) 1000 MG suppository Place 1 suppository (1,000 mg total) rectally at bedtime. Patient not taking: Reported on 01/02/2023 12/31/22   Doree Albee, [name]  oxyCODONE (ROXICODONE) 5 MG immediate release tablet Take 1 tablet (5 mg total) by mouth every 4 (four) hours as needed for severe pain. 01/03/23   Donita Brooks, MD    Current Facility-Administered Medications  Medication Dose Route Frequency Provider Last Rate Last Admin   acetaminophen (TYLENOL) tablet 650 mg  650 mg Oral Q6H PRN Hillary Bow, DO       Or   acetaminophen (TYLENOL) suppository 650 mg  650 mg Rectal Q6H PRN Hillary Bow, DO       alum & mag hydroxide-simeth (MAALOX/MYLANTA) 200-200-20 MG/5ML suspension 30 mL  30 mL Oral Q4H PRN Hillary Bow, DO       amiodarone (PACERONE) tablet 200 mg  200 mg Oral Daily Julian Reil, Jared M, DO       apixaban Everlene Balls) tablet 5 mg  5 mg Oral BID Lyda Perone M, DO   5 mg at 01/03/23 1610   aspirin EC tablet 81 mg  81 mg Oral Daily Julian Reil, Jared M, DO       atorvastatin (LIPITOR) tablet 80 mg  80 mg Oral Daily Julian Reil, Jared M, DO        diphenoxylate-atropine (LOMOTIL) 2.5-0.025 MG per tablet 1 tablet  1 tablet Oral BID Lyda Perone M, DO       hydrocortisone (ANUSOL-HC) 2.5 % rectal cream 1 Application  1 Application Topical QID PRN Hillary Bow, DO       insulin aspart (novoLOG) injection 0-9 Units  0-9 Units Subcutaneous TID WC Narda Bonds, MD       lactated ringers infusion   Intravenous Continuous Gwyneth Sprout, MD 125 mL/hr at 01/03/23 0909 New Bag at 01/03/23 0909   loperamide (IMODIUM) capsule 2 mg  2 mg Oral PRN Hillary Bow, DO  ondansetron (ZOFRAN) tablet 4 mg  4 mg Oral Q6H PRN Hillary Bow, DO       Or   ondansetron [facility]) injection 4 mg  4 mg Intravenous Q6H PRN Hillary Bow, DO       pantoprazole (PROTONIX) EC tablet 40 mg  40 mg Oral BID Lyda Perone M, DO   40 mg at 01/03/23 0156    Allergies as of 01/02/2023 - Review Complete 01/02/2023  Allergen Reaction Noted   Penicillins Other (See Comments)     Review of Systems  Constitutional:  Positive for malaise/fatigue and weight loss. Negative for chills and fever.  HENT:  Negative for hearing loss and tinnitus.   Eyes:  Negative for blurred vision and double vision.  Respiratory:  Negative for cough and hemoptysis.   Cardiovascular:  Negative for chest pain and palpitations.  Gastrointestinal:  Positive for blood in stool and diarrhea. Negative for abdominal pain, constipation, heartburn, melena, nausea and vomiting.  Genitourinary:  Negative for dysuria and urgency.  Musculoskeletal:  Negative for myalgias and neck pain.  Skin:  Negative for itching and rash.  Neurological:  Positive for weakness. Negative for seizures and loss of consciousness.  Psychiatric/Behavioral:  Negative for depression and suicidal ideas.        Physical Exam:  Vital signs in last 24 hours: Temp:  [97.4 F (36.3 C)-98 F (36.7 C)] 97.6 F (36.4 C) (05/24 0748) Pulse Rate:  [56-75] 63 (05/24 0748) Resp:  [10-18] 18 (05/24 0748) BP:  (85-130)/(64-80) 109/65 (05/24 0748) SpO2:  [95 %-99 %] 98 % (05/24 0748) Weight:  [89.1 kg] 89.1 kg (05/24 0333) Last BM Date : 01/03/23 Last BM recorded by nurses in past 5 days No data recorded  Physical Exam Constitutional:      Appearance: Normal appearance.  HENT:     Nose: Nose normal. No congestion.     Mouth/Throat:     Mouth: Mucous membranes are moist.     Pharynx: Oropharynx is clear.  Eyes:     Extraocular Movements: Extraocular movements intact.     Conjunctiva/sclera: Conjunctivae normal.  Cardiovascular:     Rate and Rhythm: Normal rate and regular rhythm.  Pulmonary:     Effort: Pulmonary effort is normal. No respiratory distress.  Abdominal:     General: Abdomen is flat. There is no distension.     Palpations: Abdomen is soft. There is no mass.     Tenderness: There is no abdominal tenderness. There is no guarding or rebound.     Hernia: No hernia is present.     Comments: Hyperactive bowel sounds  Genitourinary:    Comments: Difficult to visualize with excess cream around anus.  Questionable nonthrombosed hemorrhoid.  No blood.   Musculoskeletal:        General: No swelling. Normal range of motion.     Cervical back: Normal range of motion and neck supple.  Skin:    General: Skin is warm and dry.  Neurological:     General: No focal deficit present.     Mental Status: He is alert and oriented to person, place, and time.  Psychiatric:        Mood and Affect: Mood normal.        Behavior: Behavior normal.        Thought Content: Thought content normal.        Judgment: Judgment normal.      LAB RESULTS: Recent Labs    01/02/23 1843 01/03/23 0221  WBC 5.0 4.5  HGB 13.4 12.5*  HCT 39.8 38.2*  PLT 212 163   BMET Recent Labs    01/02/23 1843 01/03/23 0221  NA 129* 132*  K 4.1 3.9  CL 96* 99  CO2 18* 21*  GLUCOSE 169* 86  BUN 46* 41*  CREATININE 2.56* 2.05*  CALCIUM 8.1* 7.8*   LFT Recent Labs    01/02/23 1843  PROT 6.2*  ALBUMIN  2.2*  AST 64*  ALT 46*  ALKPHOS 101  BILITOT 1.4*   PT/INR No results for input(s): "LABPROT", "INR" in the last 72 hours.  STUDIES: CT PELVIS WO CONTRAST  Result Date: 01/03/2023 CLINICAL DATA:  Weakness and diarrhea. Evaluate for rectal infection. EXAM: CT PELVIS WITHOUT CONTRAST TECHNIQUE: Multidetector CT imaging of the pelvis was performed following the standard protocol without intravenous contrast. RADIATION DOSE REDUCTION: This exam was performed according to the departmental dose-optimization program which includes automated exposure control, adjustment of the mA and/or kV according to patient size and/or use of iterative reconstruction technique. COMPARISON:  None Available. FINDINGS: Evaluation of this exam is limited in the absence of intravenous contrast. Urinary Tract: The visualized ureters and urinary bladder appear unremarkable. Bowel: No bowel dilatation or active inflammation in the pelvis. The appendix is normal. Vascular/Lymphatic: Moderate aortoiliac atherosclerotic disease. The iliac veins are grossly unremarkable. No pelvic adenopathy. Reproductive: The prostate and seminal vesicles are grossly unremarkable. No pelvic mass. Other:  None Musculoskeletal: Mild degenerative changes of the hips. No acute osseous pathology. IMPRESSION: 1. No acute intrapelvic pathology. 2.  Aortic Atherosclerosis (ICD10-I70.0). Electronically Signed   By: Elgie Collard M.D.   On: 01/03/2023 01:34      Impression    Persistent diarrhea Ulcerative colitis - negative c diff and GI pathogen panel 5/20 -MRI 5/17 negative for inflammation, does show numerous tiny gallstones and gallbladder wall thickening. -CT pelvis without contrast negative for inflammation -No leukocytosis -Vedolizumab antibody pending (on Entyvio since 2019) -Labs 5/20: Sed rate 75, CRP 80.78 - fecal calprotectin pending Persistent diarrhea with elevated sed rate/CRP, negative stool studies, negative imaging.  No change  after cessation of carbometyx.  fecal calprotectin and antibody still pending.  Suspect patient is likely experiencing ulcerative colitis flare.  Burning rectal pain likely secondary to possible fissure from frequent bowel movements versus UC.  AKI, likely secondary to diarrhea hypotension -BUN 46, creatinine 2.56, GFR 27 -Sodium 129  Paroxysmal atrial fibrillation -On Eliquis  Hepatocellular carcinoma - carbometyx stopped 5/22 - AFP 9.7    Plan   -Consider IV steroids -Continue hydrocortisone cream for rectal pain, can also consider compound cream (diltiazem/lidocaine) for pain relief. -Can use Imodium on a more scheduled basis (twice daily to 3 times daily) versus using it as needed for diarrhea -Continue supportive care -Continue diet as tolerated  Thank you for your kind consultation, we will continue to follow.   Idabell Picking Leanna Sato  01/03/2023, 9:31 AM

## 2023-01-03 NOTE — Plan of Care (Signed)

## 2023-01-03 NOTE — Assessment & Plan Note (Signed)
Looks like GFR 40-50 at baseline.

## 2023-01-03 NOTE — Hospital Course (Signed)
Matthew Chen is a 67 y.o. male with a history of CKD stage III, diabetes mellitus type 2, hypertension, hepatocellular carcinoma on chemotherapy, ulcerative colitis, atrial fibrillation on Eliquis, C. difficile infection.  Patient presented secondary to weakness and diarrhea and was found to have associated AKI on admission.  Patient started on IV fluids.  GI consulted for diarrhea evaluation.

## 2023-01-03 NOTE — Op Note (Signed)
[facility] Patient Name: Matthew Chen Procedure Date : 01/03/2023 MRN: [id number] Attending MD: Willaim Rayas. Adela Lank , MD, [id number] Date of Birth: 1955/09/08 CSN: [id number] Age: 67 Admit Type: Inpatient Procedure:                Flexible Sigmoidoscopy Indications:              Rectal pain, Diarrhea - history of UC on Entyvio,                            also on Carbometyx for [facility] which can cause diarrhea                            - admitted with persistent symptoms and rectal pain Providers:                Viviann Spare P. Adela Lank, MD, Geralyn Corwin, RN,                            Martha Clan, RN, Priscella Mann, Technician Referring MD:              Medicines:                None Complications:            No immediate complications. Estimated blood loss:                            Minimal. Estimated Blood Loss:     Estimated blood loss was minimal. Procedure:                Pre-Anesthesia Assessment:                           - Prior to the procedure, a History and Physical                            was performed, and patient medications and                            allergies were reviewed. The patient's tolerance of                            previous anesthesia was also reviewed. The risks                            and benefits of the procedure and the sedation                            options and risks were discussed with the patient.                            All questions were answered, and informed consent                            was obtained. Prior Anticoagulants: The patient has  taken Eliquis (apixaban), last dose was day of                            procedure. ASA Grade Assessment: III - A patient                            with severe systemic disease. After reviewing the                            risks and benefits, the patient was deemed in                            satisfactory condition to undergo the procedure.                            After obtaining informed consent, the scope was                            passed under direct vision. The [id number]-[id number] ([id number])                            Olympus endoscope was introduced through the anus                            and advanced to the the sigmoid colon. The flexible                            sigmoidoscopy was accomplished without difficulty.                            The patient tolerated the procedure well. The                            quality of the bowel preparation was good. Scope In: Scope Out: Findings:      An anal fissure was found on perianal exam.      A single (solitary) ulcer was found in the distal rectum at the dentate       line extending into the anal canal.      The rectum, recto-sigmoid colon and sigmoid colon appeared normal.       Biopsies were taken with a cold forceps for histology. Impression:               - Anal fissure found on perianal exam.                           - A single (solitary) ulcer in the dentate line /                            anal canal.                           - The rectum, sigmoid colon and recto-sigmoid colon  are normal. Biopsied.                           Rectal ulcer / fissure causing pain. His colitis                            appears well controlled on Entyvio but will await                            biopsies. I suspect his diarrhea is from the                            Carbometyx and would hold off on steroids at this                            time. Recommendation:           - Return patient to hospital ward for ongoing care.                           - Advance diet as tolerated.                           - Continue present medications.                           - Await pathology results.                           - Immodium PRN for the diarrhea                           - Have ordered Diltiazem / lidocaine ointment to                            the [facility] - pea sized amount PR TID                            for fissure. His wife can pick it up at the                            pharmacy and bring to the hospital (not available                            as inpatient, compounding pharmacies only)                           - Consider carafate suppository PR for rectal ulcer                           - Will follow call with questions Procedure Code(s):        --- Professional ---                           [phone], Sigmoidoscopy, flexible; with biopsy, single  or multiple Diagnosis Code(s):        --- Professional ---                           K60.2, Anal fissure, unspecified                           K62.6, Ulcer of anus and rectum                           K62.89, Other specified diseases of anus and rectum                           R19.7, Diarrhea, unspecified CPT copyright 2022 [employer]. All rights reserved. The codes documented in this report are preliminary and upon coder review may  be revised to meet current compliance requirements. Viviann Spare P. Itzayana Pardy, MD 01/03/2023 4:56:21 PM This report has been signed electronically. Number of Addenda: 0

## 2023-01-03 NOTE — ED Notes (Signed)
ED TO INPATIENT HANDOFF REPORT  ED Nurse Name and Phone #: Amil Amen [phone]  S Name/Age/Gender Matthew Chen 67 y.o. male Room/Bed: 037C/037C  Code Status   Code Status: Full Code  Home/SNF/Other Home Patient oriented to: self, place, time, and situation Is this baseline? Yes   Triage Complete: Triage complete  Chief Complaint AKI (acute kidney injury) (HCC) [N17.9]  Triage Note Pt with hx of liver cancer c/o weakness and diarrhea ongoing for three weeks. Pt reports that he has lost approximately 8 lbs in that time. Pt states that he does see some bright red blood, only when wiping. Chemo drugs stopped earlier in the week d/t symptoms. Pt hypotensive in triage at 85/64 during triage. States that his BP is labile and can sometimes by 90's/70's. Pt is anticoagulated on Eliquis.    Allergies Allergies  Allergen Reactions   Penicillins Other (See Comments)    Unsure, childhood reaction Has patient had a PCN reaction causing immediate rash, facial/tongue/throat swelling, SOB or lightheadedness with hypotension: Unknown Has patient had a PCN reaction causing severe rash involving mucus membranes or skin necrosis: Unknown Has patient had a PCN reaction that required hospitalization: No Has patient had a PCN reaction occurring within the last 10 years: No If all of the above answers are "NO", then may proceed with Cephalosporin use.     Level of Care/Admitting Diagnosis ED Disposition     ED Disposition  Admit   Condition  --   Comment  Hospital Area: [facility] [[id number]]  Level of Care: Med-Surg [[id number]]  May place patient in observation at [facility] or [facility] if equivalent level of care is available:: No  Covid Evaluation: Asymptomatic - no recent exposure (last 10 days) testing not required  Diagnosis: AKI (acute kidney injury) [facility]) [[id number]]  Admitting Physician: Hillary Bow [phone]  Attending Physician: Hillary Bow [[id number]]           B Medical/Surgery History Past Medical History:  Diagnosis Date   Adenomatous colon polyp 03/2008   Anginal pain (HCC)    Aortic aneurysm, thoracic (HCC) 09/09/2016   4.4 cm by echo 05/2015   Arthritis    "joints in my fingers ache" (07/13/2015)   Bicuspid aortic valve 09/09/2016   Cataract 2019   bilateral eyes   CKD (chronic kidney disease), stage III (HCC)    Coronary artery disease    Diabetes mellitus without complication (HCC)    Hemorrhoids    Hepatocellular carcinoma (HCC)    Hyperlipidemia    Hypertension    Paroxysmal atrial fibrillation (HCC)    Peripheral arterial disease (HCC)    a. dopppler 05/29/2015 revealed high-grade right popliteal and distal left SFA disease b. LE angio 06/28/2015 revealing high grade calcific dx with distal L SFA and R popliteal artery s/p diamondback orbital rotational atherectomy, PTA of L SFA  c. 07/13/2015 diamondback orbital rotational atherectomy and drug eluting balloon angioplasty of R popliteal artery (P2 segment) using distal protection    Type II diabetes mellitus (HCC)    Ulcerative colitis    Past Surgical History:  Procedure Laterality Date   ABDOMINAL AORTOGRAM W/LOWER EXTREMITY N/A 04/27/2018   Procedure: ABDOMINAL AORTOGRAM W/LOWER EXTREMITY;  Surgeon: Runell Gess, MD;  [facility];  Service: Cardiovascular;  Laterality: N/A;   COLONOSCOPY     COLONOSCOPY W/ POLYPECTOMY  X 4-5   CORONARY ATHERECTOMY N/A 02/15/2021   Procedure: CORONARY ATHERECTOMY;  Surgeon: Runell Gess, MD;  Location: [facility];  Service: Cardiovascular;  Laterality: N/A;  aborted    CORONARY BALLOON ANGIOPLASTY N/A 02/15/2021   Procedure: CORONARY BALLOON ANGIOPLASTY;  Surgeon: Runell Gess, MD;  Location: [facility];  Service: Cardiovascular;  Laterality: N/A;   COSMETIC SURGERY  < 2000   "removed birthmark from top of my head"   IR ANGIOGRAM SELECTIVE EACH ADDITIONAL VESSEL  06/08/2021   IR  ANGIOGRAM SELECTIVE EACH ADDITIONAL VESSEL  06/08/2021   IR ANGIOGRAM SELECTIVE EACH ADDITIONAL VESSEL  06/08/2021   IR ANGIOGRAM SELECTIVE EACH ADDITIONAL VESSEL  06/08/2021   IR ANGIOGRAM SELECTIVE EACH ADDITIONAL VESSEL  06/21/2021   IR ANGIOGRAM SELECTIVE EACH ADDITIONAL VESSEL  06/21/2021   IR ANGIOGRAM SELECTIVE EACH ADDITIONAL VESSEL  06/21/2021   IR ANGIOGRAM SELECTIVE EACH ADDITIONAL VESSEL  06/21/2021   IR ANGIOGRAM VISCERAL SELECTIVE  06/08/2021   IR ANGIOGRAM VISCERAL SELECTIVE  06/21/2021   IR EMBO ARTERIAL NOT HEMORR HEMANG INC GUIDE ROADMAPPING  06/08/2021   IR EMBO TUMOR ORGAN ISCHEMIA INFARCT INC GUIDE ROADMAPPING  06/21/2021   IR EMBO TUMOR ORGAN ISCHEMIA INFARCT INC GUIDE ROADMAPPING  06/21/2021   IR RADIOLOGIST EVAL & MGMT  05/08/2021   IR RADIOLOGIST EVAL & MGMT  07/26/2021   IR RADIOLOGIST EVAL & MGMT  09/05/2021   IR RADIOLOGIST EVAL & MGMT  12/27/2021   IR US GUIDE VASC ACCESS LEFT  06/21/2021   IR US GUIDE VASC ACCESS RIGHT  06/08/2021   LEFT HEART CATH AND CORONARY ANGIOGRAPHY N/A 02/08/2021   Procedure: LEFT HEART CATH AND CORONARY ANGIOGRAPHY;  Surgeon: Runell Gess, MD;  [facility];  Service: Cardiovascular;  Laterality: N/A;   PERIPHERAL VASCULAR ATHERECTOMY  04/27/2018   Procedure: PERIPHERAL VASCULAR ATHERECTOMY;  Surgeon: Runell Gess, MD;  Location: [facility] INVASIVE CV LAB;  Service: Cardiovascular;;  right popliteal artery   PERIPHERAL VASCULAR CATHETERIZATION N/A 06/29/2015   Procedure: Lower Extremity Angiography;  Surgeon: Runell Gess, MD;  Location: [facility] INVASIVE CV LAB;  Service: Cardiovascular;  Laterality: N/A;   PERIPHERAL VASCULAR CATHETERIZATION N/A 07/13/2015   Procedure: Lower Extremity Angiography;  Surgeon: Runell Gess, MD;  Location: [facility] INVASIVE CV LAB;  Service: Cardiovascular;  Laterality: N/A;   PERIPHERAL VASCULAR CATHETERIZATION  07/13/2015   Procedure: Peripheral Vascular Atherectomy;  Surgeon: Runell Gess, MD;  [facility];  Service: Cardiovascular;;   PERIPHERAL VASCULAR INTERVENTION  04/27/2018   Procedure: PERIPHERAL VASCULAR INTERVENTION;  Surgeon: Runell Gess, MD;  [facility]: [facility];  Service: Cardiovascular;;  right popliteal artery   POLYPECTOMY     TONSILLECTOMY     UPPER GASTROINTESTINAL ENDOSCOPY       A IV Location/Drains/Wounds Patient Lines/Drains/Airways Status     Active Line/Drains/Airways     Name Placement date Placement time Site Days   Peripheral IV 01/02/23 Posterior;Right Forearm 01/02/23  2209  Forearm  1            Intake/Output Last 24 hours  Intake/Output Summary (Last 24 hours) at 01/03/2023 0225 Last data filed at 01/03/2023 0019 Gross per 24 hour  Intake 1000.41 ml  Output --  Net 1000.41 ml    Labs/Imaging Results for orders placed or performed during the hospital encounter of 01/02/23 (from the past 48 hour(s))  Comprehensive metabolic panel     Status: Abnormal   Collection Time: 01/02/23  6:43 PM  Result Value Ref Range   Sodium 129 (L) 135 - 145  mmol/L   Potassium 4.1 3.5 - 5.1 mmol/L   Chloride 96 (L) 98 - 111 mmol/L   CO2 18 (L) 22 - 32 mmol/L   Glucose, Bld 169 (H) 70 - 99 mg/dL    Comment: Glucose reference range applies only to samples taken after fasting for at least 8 hours.   BUN 46 (H) 8 - 23 mg/dL   Creatinine, Ser 1.61 (H) 0.61 - 1.24 mg/dL   Calcium 8.1 (L) 8.9 - 10.3 mg/dL   Total Protein 6.2 (L) 6.5 - 8.1 g/dL   Albumin 2.2 (L) 3.5 - 5.0 g/dL   AST 64 (H) 15 - 41 U/L   ALT 46 (H) 0 - 44 U/L   Alkaline Phosphatase 101 38 - 126 U/L   Total Bilirubin 1.4 (H) 0.3 - 1.2 mg/dL   GFR, Estimated 27 (L) >60 mL/min    Comment: (NOTE) Calculated using the CKD-EPI Creatinine Equation (2021)    Anion gap 15 5 - 15    Comment: Performed at [facility], [address] [place], [place] [address]  CBC     Status: Abnormal   Collection Time: 01/02/23  6:43 PM  Result Value Ref Range    WBC 5.0 4.0 - 10.5 K/uL   RBC 4.64 4.22 - 5.81 MIL/uL   Hemoglobin 13.4 13.0 - 17.0 g/dL   HCT 54.0 98.1 - 19.1 %   MCV 85.8 80.0 - 100.0 fL   MCH 28.9 26.0 - 34.0 pg   MCHC 33.7 30.0 - 36.0 g/dL   RDW 47.8 (H) 29.5 - 62.1 %   Platelets 212 150 - 400 K/uL   nRBC 0.0 0.0 - 0.2 %    Comment: Performed at [facility], [address], [place], [place] [address]  Type and screen [facility]     Status: None   Collection Time: 01/02/23  6:43 PM  Result Value Ref Range   ABO/RH(D) O POS    Antibody Screen NEG    Sample Expiration      01/05/2023,2359 Performed at [facility], [address] [place], [place] [address]   Magnesium     Status: None   Collection Time: 01/02/23  6:43 PM  Result Value Ref Range   Magnesium 2.3 1.7 - 2.4 mg/dL    Comment: Performed at [facility], [address] [place], [place] [address]  CBG monitoring, ED     Status: None   Collection Time: 01/03/23  2:02 AM  Result Value Ref Range   Glucose-Capillary 93 70 - 99 mg/dL    Comment: Glucose reference range applies only to samples taken after fasting for at least 8 hours.   CT PELVIS WO CONTRAST  Result Date: 01/03/2023 CLINICAL DATA:  Weakness and diarrhea. Evaluate for rectal infection. EXAM: CT PELVIS WITHOUT CONTRAST TECHNIQUE: Multidetector CT imaging of the pelvis was performed following the standard protocol without intravenous contrast. RADIATION DOSE REDUCTION: This exam was performed according to the departmental dose-optimization program which includes automated exposure control, adjustment of the mA and/or kV according to patient size and/or use of iterative reconstruction technique. COMPARISON:  None Available. FINDINGS: Evaluation of this exam is limited in the absence of intravenous contrast. Urinary Tract: The visualized ureters and urinary bladder appear unremarkable. Bowel: No bowel dilatation or active inflammation in the pelvis. The appendix is normal.  Vascular/Lymphatic: Moderate aortoiliac atherosclerotic disease. The iliac veins are grossly unremarkable. No pelvic adenopathy. Reproductive: The prostate and  seminal vesicles are grossly unremarkable. No pelvic mass. Other:  None Musculoskeletal: Mild degenerative changes of the hips. No acute osseous pathology. IMPRESSION: 1. No acute intrapelvic pathology. 2.  Aortic Atherosclerosis (ICD10-I70.0). Electronically Signed   By: Elgie Collard M.D.   On: 01/03/2023 01:34    Pending Labs Unresulted Labs (From admission, onward)     Start     Ordered   01/03/23 0500  CBC  Tomorrow morning,   R        01/03/23 0043   01/03/23 0500  Basic metabolic panel  Tomorrow morning,   R        01/03/23 0043            Vitals/Pain Today's Vitals   01/02/23 2230 01/02/23 2300 01/02/23 2330 01/03/23 0152  BP: 100/66 126/65 122/80 119/66  Pulse: 67 66 65 74  Resp: 11 15  18   Temp:    97.7 F (36.5 C)  TempSrc:    Oral  SpO2: 97% 95% 99% 98%  PainSc:        Isolation Precautions No active isolations  Medications Medications  lactated ringers infusion ( Intravenous New Bag/Given 01/03/23 0025)  loperamide (IMODIUM) capsule 2 mg (has no administration in time range)  acetaminophen (TYLENOL) tablet 650 mg (has no administration in time range)    Or  acetaminophen (TYLENOL) suppository 650 mg (has no administration in time range)  ondansetron (ZOFRAN) tablet 4 mg (has no administration in time range)    Or  ondansetron (ZOFRAN) injection 4 mg (has no administration in time range)  amiodarone (PACERONE) tablet 200 mg (has no administration in time range)  apixaban (ELIQUIS) tablet 5 mg (5 mg Oral Given 01/03/23 0156)  aspirin EC tablet 81 mg (has no administration in time range)  atorvastatin (LIPITOR) tablet 80 mg (has no administration in time range)  insulin aspart (novoLOG) injection 0-9 Units ( Subcutaneous Not Given 01/03/23 0202)  pantoprazole (PROTONIX) EC tablet 40 mg (40 mg Oral  Given 01/03/23 0156)  diphenoxylate-atropine (LOMOTIL) 2.5-0.025 MG per tablet 1 tablet (1 tablet Oral Not Given 01/03/23 0157)  lactated ringers bolus 1,000 mL (0 mLs Intravenous Stopped 01/03/23 0019)  lactated ringers bolus 1,000 mL (1,000 mLs Intravenous New Bag/Given 01/03/23 0205)    Mobility walks with person assist     Focused Assessments Renal Assessment Handoff:  Hemodialysis Schedule: n/a Last Hemodialysis date and time: n/a   Restricted appendage: n/a   R Recommendations: See Admitting Provider Note  Report given to:   Additional Notes: pt able to walk but weak due to diarrhea/dehydration. No diarrhea since arrival.

## 2023-01-03 NOTE — Progress Notes (Signed)
PROGRESS NOTE    Matthew Chen  ZOX:[id number] DOB: Oct 31, 1955 DOA: 01/02/2023 PCP: Donita Brooks, MD   Brief Narrative: Matthew Chen is a 67 y.o. male with a history of CKD stage III, diabetes mellitus type 2, hypertension, hepatocellular carcinoma on chemotherapy, ulcerative colitis, atrial fibrillation on Eliquis, C. difficile infection.  Patient presented secondary to weakness and diarrhea and was found to have associated AKI on admission.  Patient started on IV fluids.  GI consulted for diarrhea evaluation.   Assessment and Plan:  AKI on CKD stage IIIa Secondary to fluid loss from diarrhea.  Baseline creatinine appears to be around 1.5-1.8.  Creatinine of 2.56 on admission.  Creatinine improved to 2.05 with IV fluids.  Diarrhea has improved -Continue IV fluids   Diarrhea Unclear etiology.  Concern that this may be related to ulcerative colitis.  Patient with elevated ESR and CRP.  GI pathogen panel and C. difficile panel are negative.  [place] [facility] consulted with concern for possible chemotherapy-induced diarrhea versus possible ulcerative colitis flare. -Watch for continued diarrheal episodes -GI recommendations: Follow-up fecal calprotectin, flexible sigmoidoscopy planned for tonight   Hepatocellular carcinoma Patient follows with hematology oncology as an outpatient.  Patient was on Cabometyx which was held.  Patient is also managed on Entyvio.  Ulcerative colitis Known history.  Previously in remission.  Concern for possible flare but this is unclear.  GI consulted as mentioned above.  Diabetes type 2 -Continue sliding scale insulin  Paroxysmal atrial fibrillation -Continue amiodarone and Eliquis  Primary hypertension Per H&P   DVT prophylaxis: Eliquis Code Status:   Code Status: Full Code Family Communication: Wife at bedside Disposition Plan: Discharge home pending GI recommendations and continued improvement of AKI   Consultants:   GI  Procedures:   None  Antimicrobials: None    Subjective: Patient reports no diarrhea this morning.  Objective: BP 109/65 (BP Location: Right Arm)   Pulse 63   Temp 97.6 F (36.4 C) (Oral)   Resp 18   Ht 5\' 10"  (1.778 m)   Wt 89.1 kg   SpO2 98%   BMI 28.18 kg/m   Examination:  General exam: Appears calm and comfortable Respiratory system: Clear to auscultation. Respiratory effort normal. Cardiovascular system: S1 & S2 heard, RRR. 2/6 systolic murmur. Gastrointestinal system: Abdomen is nondistended, soft and nontender. Normal bowel sounds heard. Central nervous system: Alert and oriented. No focal neurological deficits. Musculoskeletal: No edema. No calf tenderness Skin: No cyanosis. No rashes Psychiatry: Judgement and insight appear normal. Mood & affect appropriate.    Data Reviewed: I have personally reviewed following labs and imaging studies  CBC Lab Results  Component Value Date   WBC 4.5 01/03/2023   RBC 4.37 01/03/2023   HGB 12.5 (L) 01/03/2023   HCT 38.2 (L) 01/03/2023   MCV 87.4 01/03/2023   MCH 28.6 01/03/2023   PLT 163 01/03/2023   MCHC 32.7 01/03/2023   RDW 26.5 (H) 01/03/2023   LYMPHSABS 1.5 12/31/2022   MONOABS 0.8 12/31/2022   EOSABS 0.1 12/31/2022   BASOSABS 0.1 12/31/2022     Last metabolic panel Lab Results  Component Value Date   NA 132 (L) 01/03/2023   K 3.9 01/03/2023   CL 99 01/03/2023   CO2 21 (L) 01/03/2023   BUN 41 (H) 01/03/2023   CREATININE 2.05 (H) 01/03/2023   GLUCOSE 86 01/03/2023   GFRNONAA 35 (L) 01/03/2023   GFRAA 49 (L) 11/20/2020   CALCIUM 7.8 (L) 01/03/2023   PROT 6.2 (  L) 01/02/2023   ALBUMIN 2.2 (L) 01/02/2023   LABGLOB 3.1 12/24/2022   AGRATIO 1.0 (L) 12/24/2022   BILITOT 1.4 (H) 01/02/2023   ALKPHOS 101 01/02/2023   AST 64 (H) 01/02/2023   ALT 46 (H) 01/02/2023   ANIONGAP 12 01/03/2023    GFR: Estimated Creatinine Clearance: 39.3 mL/min (A) (by C-G formula based on SCr of 2.05 mg/dL (H)).  Recent Results (from  the past 240 hour(s))  Clostridium Difficile by PCR     Status: None   Collection Time: 12/31/22  9:54 AM   Specimen: Stool   Stool  Result Value Ref Range Status   Toxigenic C. Difficile by PCR Negative Negative Final      Radiology Studies: CT PELVIS WO CONTRAST  Result Date: 01/03/2023 CLINICAL DATA:  Weakness and diarrhea. Evaluate for rectal infection. EXAM: CT PELVIS WITHOUT CONTRAST TECHNIQUE: Multidetector CT imaging of the pelvis was performed following the standard protocol without intravenous contrast. RADIATION DOSE REDUCTION: This exam was performed according to the departmental dose-optimization program which includes automated exposure control, adjustment of the mA and/or kV according to patient size and/or use of iterative reconstruction technique. COMPARISON:  None Available. FINDINGS: Evaluation of this exam is limited in the absence of intravenous contrast. Urinary Tract: The visualized ureters and urinary bladder appear unremarkable. Bowel: No bowel dilatation or active inflammation in the pelvis. The appendix is normal. Vascular/Lymphatic: Moderate aortoiliac atherosclerotic disease. The iliac veins are grossly unremarkable. No pelvic adenopathy. Reproductive: The prostate and seminal vesicles are grossly unremarkable. No pelvic mass. Other:  None Musculoskeletal: Mild degenerative changes of the hips. No acute osseous pathology. IMPRESSION: 1. No acute intrapelvic pathology. 2.  Aortic Atherosclerosis (ICD10-I70.0). Electronically Signed   By: Elgie Collard M.D.   On: 01/03/2023 01:34      LOS: 0 days    Jacquelin Hawking, MD [facility] 01/03/2023, 11:21 AM   If 7PM-7AM, please contact night-coverage [email or web address]

## 2023-01-03 NOTE — Progress Notes (Signed)
Pt transported from room 5C10 to Endo via w/c.  Hand-off report given to [facility] in Endo.

## 2023-01-03 NOTE — Telephone Encounter (Signed)
Called and spoke with Angie at [facility]. I gave her a verbal order for Diltiazem 2%/Lidocaine 2% ointment. I provided the pharmacy with patient's wife's contact phone number since she will be picking up the medication.

## 2023-01-03 NOTE — Assessment & Plan Note (Signed)
H/o UC previously in remission but not clear if this causing diarrhea or not currently (see above). CT pelvis And [facility] consult as discussed above

## 2023-01-03 NOTE — Assessment & Plan Note (Addendum)
Carbometyx put on hold x2 days ago by heme/onc. Was due for Entyvio in 4 days, but suspect this going to be on hold too most likely.

## 2023-01-03 NOTE — Assessment & Plan Note (Addendum)
Pt with persistent diarrhea: C.Diff neg GI pathogen pnl neg At this point diarrhea is suspected to be due to either UC flare vs Carbometyx side effect. Carbometyx stopped x3 days ago No inflammation seen on MRI abd to suggest UC flare on 5/17; however, this didn't get the rectum.  Had CT pelvis with contrast scheduled for early June by GI Will get CT w/o contrast (due to renal fxn) of pelvis here PRN imodium Message sent to [facility] for AM consult

## 2023-01-03 NOTE — Assessment & Plan Note (Addendum)
Creat 2.5 today up from 1.8 a couple of days ago. Likely pre-renal / ATN in setting of severe diarrhea. IVF: 1L bolus in ED Giving a 2nd 1L bolus now Then 125 cc/hr Strict intake and output Repeat BMP in AM

## 2023-01-03 NOTE — Assessment & Plan Note (Signed)
Cont amiodarone Cont Eliquis

## 2023-01-03 NOTE — H&P (Signed)
History and Physical    Patient: Matthew Chen GNF:[id number] DOB: [birth year] DOA: 01/02/2023 DOS: the patient was seen and examined on 01/03/2023 PCP: Donita Brooks, MD  Patient coming from: Home  Chief Complaint:  Chief Complaint  Patient presents with   Weakness   Diarrhea   HPI: Matthew Chen is a 67 y.o. male with medical history significant of CKD3, DM2, HTN, HCC on chemo with carbometyx and Entyvio until ~2 days ago, UC, A.fib on eliquis.  C.Diff infection in 2022.  Pt with Diarrhea for last several weeks.  Associated rectal pain and fecal incontinence.  Saw onc on 5/21 who stopped his carbometyx.  Also saw [facility] in office on 5/21 who were concerned for carbometyx side effect vs UC flare given elevated ESR and CRP.  Infectious work up ordered by [facility] including C.Diff and GI pathogen pnl has since come back negative.  They noted that Recent MRI abdomen (5/17) shows no evidence of bowel inflammation, this did not get the rectum.  And planned on "will get CT pelvis with contrast to evaluate for abscess".  Pt's diarrhea persisted.  Pt with poor PO intake over past couple of days as well.  No ABx recently.  He attempted to go to his family member's graduation today and when he tried to get out of the car he felt very lightheaded and weak. They checked his blood pressure and it was in the 80s systolic. He is still taking his blood pressure medications.   Review of Systems: As mentioned in the history of present illness. All other systems reviewed and are negative. Past Medical History:  Diagnosis Date   Adenomatous colon polyp 03/2008   Anginal pain (HCC)    Aortic aneurysm, thoracic (HCC) 09/09/2016   4.4 cm by echo 05/2015   Arthritis    "joints in my fingers ache" (07/13/2015)   Bicuspid aortic valve 09/09/2016   Cataract 2019   bilateral eyes   CKD (chronic kidney disease), stage III (HCC)    Coronary artery disease    Diabetes mellitus without complication (HCC)     Hemorrhoids    Hepatocellular carcinoma (HCC)    Hyperlipidemia    Hypertension    Paroxysmal atrial fibrillation (HCC)    Peripheral arterial disease (HCC)    a. dopppler 05/29/2015 revealed high-grade right popliteal and distal left SFA disease b. LE angio 06/28/2015 revealing high grade calcific dx with distal L SFA and R popliteal artery s/p diamondback orbital rotational atherectomy, PTA of L SFA  c. 07/13/2015 diamondback orbital rotational atherectomy and drug eluting balloon angioplasty of R popliteal artery (P2 segment) using distal protection    Type II diabetes mellitus (HCC)    Ulcerative colitis    Past Surgical History:  Procedure Laterality Date   ABDOMINAL AORTOGRAM W/LOWER EXTREMITY N/A 04/27/2018   Procedure: ABDOMINAL AORTOGRAM W/LOWER EXTREMITY;  Surgeon: Runell Gess, MD;  Location: [facility];  Service: Cardiovascular;  Laterality: N/A;   COLONOSCOPY     COLONOSCOPY W/ POLYPECTOMY  X 4-5   CORONARY ATHERECTOMY N/A 02/15/2021   Procedure: CORONARY ATHERECTOMY;  Surgeon: Runell Gess, MD;  Location: [facility];  Service: Cardiovascular;  Laterality: N/A;  aborted    CORONARY BALLOON ANGIOPLASTY N/A 02/15/2021   Procedure: CORONARY BALLOON ANGIOPLASTY;  Surgeon: Runell Gess, MD;  Location: [facility];  Service: Cardiovascular;  Laterality: N/A;   COSMETIC SURGERY  < 2000   "removed birthmark from top of my head"  IR ANGIOGRAM SELECTIVE EACH ADDITIONAL VESSEL  06/08/2021   IR ANGIOGRAM SELECTIVE EACH ADDITIONAL VESSEL  06/08/2021   IR ANGIOGRAM SELECTIVE EACH ADDITIONAL VESSEL  06/08/2021   IR ANGIOGRAM SELECTIVE EACH ADDITIONAL VESSEL  06/08/2021   IR ANGIOGRAM SELECTIVE EACH ADDITIONAL VESSEL  06/21/2021   IR ANGIOGRAM SELECTIVE EACH ADDITIONAL VESSEL  06/21/2021   IR ANGIOGRAM SELECTIVE EACH ADDITIONAL VESSEL  06/21/2021   IR ANGIOGRAM SELECTIVE EACH ADDITIONAL VESSEL  06/21/2021   IR ANGIOGRAM VISCERAL SELECTIVE  06/08/2021    IR ANGIOGRAM VISCERAL SELECTIVE  06/21/2021   IR EMBO ARTERIAL NOT HEMORR HEMANG INC GUIDE ROADMAPPING  06/08/2021   IR EMBO TUMOR ORGAN ISCHEMIA INFARCT INC GUIDE ROADMAPPING  06/21/2021   IR EMBO TUMOR ORGAN ISCHEMIA INFARCT INC GUIDE ROADMAPPING  06/21/2021   IR RADIOLOGIST EVAL & MGMT  05/08/2021   IR RADIOLOGIST EVAL & MGMT  07/26/2021   IR RADIOLOGIST EVAL & MGMT  09/05/2021   IR RADIOLOGIST EVAL & MGMT  12/27/2021   IR US GUIDE VASC ACCESS LEFT  06/21/2021   IR US GUIDE VASC ACCESS RIGHT  06/08/2021   LEFT HEART CATH AND CORONARY ANGIOGRAPHY N/A 02/08/2021   Procedure: LEFT HEART CATH AND CORONARY ANGIOGRAPHY;  Surgeon: Runell Gess, MD;  Location: [facility];  Service: Cardiovascular;  Laterality: N/A;   PERIPHERAL VASCULAR ATHERECTOMY  04/27/2018   Procedure: PERIPHERAL VASCULAR ATHERECTOMY;  Surgeon: Runell Gess, MD;  Location: [facility] IN[facility];  Service: Cardiovascular;;  right popliteal artery   PERIPHERAL VASCULAR CATHETERIZATION N/A 06/29/2015   Procedure: Lower Extremity Angiography;  Surgeon: Runell Gess, MD;  Location: [facility] INVASIVE CV [facility];  Service: Cardiovascular;  Laterality: N/A;   PERIPHERAL VASCULAR CATHETERIZATION N/A 07/13/2015   Procedure: Lower Extremity Angiography;  Surgeon: Runell Gess, MD;  Location: [facility];  Service: Cardiovascular;  Laterality: N/A;   PERIPHERAL VASCULAR CATHETERIZATION  07/13/2015   Procedure: Peripheral Vascular Atherectomy;  Surgeon: Runell Gess, MD;  Location: [facility];  Service: Cardiovascular;;   PERIPHERAL VASCULAR INTERVENTION  04/27/2018   Procedure: PERIPHERAL VASCULAR INTERVENTION;  Surgeon: Runell Gess, MD;  Location: [facility];  Service: Cardiovascular;;  right popliteal artery   POLYPECTOMY     TONSILLECTOMY     UPPER GASTROINTESTINAL ENDOSCOPY     Social History:  reports that he quit smoking about 26 years ago. His smoking use included cigarettes. He has  a 37.50 pack-year smoking history. He has never used smokeless tobacco. He reports current alcohol use of about 1.0 standard drink of alcohol per week. He reports that he does not use drugs.  Allergies  Allergen Reactions   Penicillins Other (See Comments)    Unsure, childhood reaction Has patient had a PCN reaction causing immediate rash, facial/tongue/throat swelling, SOB or lightheadedness with hypotension: Unknown Has patient had a PCN reaction causing severe rash involving mucus membranes or skin necrosis: Unknown Has patient had a PCN reaction that required hospitalization: No Has patient had a PCN reaction occurring within the last 10 years: No If all of the above answers are "NO", then may proceed with Cephalosporin use.     Family History  Problem Relation Age of Onset   Colon cancer Maternal Grandmother        in her 11's   Colon polyps Mother        in her 12's   Diabetes Mother    Heart disease Father    Diabetes Maternal Aunt  Aunts and Uncles   Esophageal cancer Neg Hx    Stomach cancer Neg Hx    Rectal cancer Neg Hx     Prior to Admission medications   Medication Sig Start Date End Date Taking? Authorizing Provider  acetaminophen (TYLENOL) 500 MG tablet Take 1 tablet (500 mg total) by mouth every 6 (six) hours as needed. Patient taking differently: Take 500 mg by mouth every 6 (six) hours as needed for moderate pain. 03/27/22  Yes Derwood Kaplan, MD  amiodarone (PACERONE) 200 MG tablet Take 1 tablet (200 mg total) by mouth daily. 12/24/22  Yes Graciella Freer, [name]  apixaban (ELIQUIS) 5 MG TABS tablet Take 1 tablet (5 mg total) by mouth 2 (two) times daily. 10/30/22  Yes Joylene Grapes, NP  aspirin EC 81 MG tablet Take 1 tablet (81 mg total) by mouth daily. 06/01/15  Yes Chilton Si, MD  atorvastatin (LIPITOR) 80 MG tablet Take 1 tablet (80 mg total) by mouth daily. 10/30/22  Yes Monge, Petra Kuba, NP  carvedilol (COREG) 12.5 MG tablet Take 1 tablet  (12.5 mg total) by mouth 2 (two) times daily with a meal. 12/18/22  Yes Donita Brooks, MD  cyanocobalamin (VITAMIN B12) 1000 MCG tablet Take 1,000 mcg by mouth daily.   Yes [provider]  diphenoxylate-atropine (LOMOTIL) 2.5-0.025 MG tablet Take 1 tablet by mouth in the morning and at bedtime. 12/30/22  Yes Donita Brooks, MD  empagliflozin (JARDIANCE) 10 MG TABS tablet Take 1 tablet (10 mg total) by mouth daily. 10/30/22  Yes Monge, Petra Kuba, NP  fenofibrate 160 MG tablet Take 1 tablet (160 mg total) by mouth daily. 03/22/22  Yes Donita Brooks, MD  lisinopril (ZESTRIL) 20 MG tablet Take 1 tablet (20 mg total) by mouth daily. 10/30/22  Yes Monge, Petra Kuba, NP  magic mouthwash w/lidocaine SOLN Take 5 mLs by mouth every 6 (six) hours as needed for mouth pain. 12/31/22  Yes Quentin Mulling R, [name]  Omega-3 Fatty Acids (FISH OIL) 1000 MG CAPS Take 1,000 mg by mouth 2 (two) times daily.   Yes [provider]  pantoprazole (PROTONIX) 40 MG tablet Take 1 tablet (40 mg total) by mouth 2 (two) times daily. 03/22/22  Yes Donita Brooks, MD  pioglitazone (ACTOS) 30 MG tablet TAKE 1 TABLET BY MOUTH EVERY DAY. STRENGTH: 30 MG Patient taking differently: Take 30 mg by mouth daily. 08/26/22  Yes Donita Brooks, MD  sildenafil (VIAGRA) 100 MG tablet Take 0.5-1 tablets (50-100 mg total) by mouth daily as needed for erectile dysfunction. 05/30/22  Yes Donita Brooks, MD  sitaGLIPtin (JANUVIA) 100 MG tablet Take 1 tablet (100 mg total) by mouth daily. 11/05/22  Yes Howard, Amber S, FNP  vedolizumab (ENTYVIO) 300 MG injection Inject 300 mg as directed See admin instructions. Every 2 months   Yes [provider]  diltiazem (CARDIZEM) 30 MG tablet Take 1 tablet (30 mg total) by mouth 3 (three) times daily as needed (palpitations). Patient not taking: Reported on 01/02/2023 10/30/22 10/30/23  Joylene Grapes, NP  glucose blood (ONETOUCH ULTRA) test strip CHECK BLOOD SUGAR TWICE A DAY 03/19/21    Donita Brooks, MD  Insulin Pen Needle (B-D UF III MINI PEN NEEDLES) 31G X 5 MM MISC Use daily with Victoza 05/31/22   Donita Brooks, MD  mesalamine (CANASA) 1000 MG suppository Place 1 suppository (1,000 mg total) rectally at bedtime. Patient not taking: Reported on 01/02/2023 12/31/22   Quentin Mulling  R, [name]    Physical Exam: Vitals:   01/02/23 2200 01/02/23 2230 01/02/23 2300 01/02/23 2330  BP: 99/76 100/66 126/65 122/80  Pulse: 65 67 66 65  Resp: 10 11 15    Temp:      SpO2: 97% 97% 95% 99%   Constitutional: Ill appearing Respiratory: clear to auscultation bilaterally, no wheezing, no crackles. Normal respiratory effort. No accessory muscle use.  Cardiovascular: Regular rate and rhythm, no murmurs / rubs / gallops. No extremity edema. 2+ pedal pulses. No carotid bruits.  Abdomen: no tenderness, no masses palpated. No hepatosplenomegaly. Bowel sounds positive.  Neurologic: CN 2-12 grossly intact. Sensation intact, DTR normal. Strength 5/5 in all 4.  Psychiatric: Normal judgment and insight. Alert and oriented x 3. Normal mood.   Data Reviewed:    Labs on Admission: I have personally reviewed following labs and imaging studies  CBC: Recent Labs  Lab 12/31/22 0807 01/02/23 1843  WBC 7.2 5.0  NEUTROABS 4.8  --   HGB 13.9 13.4  HCT 41.3 39.8  MCV 84.5 85.8  PLT 210 212   Basic Metabolic Panel: Recent Labs  Lab 12/31/22 0807 01/02/23 1843  NA 132* 129*  K 4.1 4.1  CL 97* 96*  CO2 27 18*  GLUCOSE 110* 169*  BUN 35* 46*  CREATININE 1.85* 2.56*  CALCIUM 8.8* 8.1*   GFR: Estimated Creatinine Clearance: 31.2 mL/min (A) (by C-G formula based on SCr of 2.56 mg/dL (H)). Liver Function Tests: Recent Labs  Lab 12/31/22 0807 01/02/23 1843  AST 58* 64*  ALT 38 46*  ALKPHOS 91 101  BILITOT 1.4* 1.4*  PROT 6.5 6.2*  ALBUMIN 3.1* 2.2*    Urine analysis:    Component Value Date/Time   COLORURINE YELLOW 09/20/2013 0943   APPEARANCEUR CLEAR 09/20/2013 0943    LABSPEC 1.020 09/20/2013 0943   PHURINE 5.0 09/20/2013 0943   GLUCOSEU >=1000 (A) 09/20/2013 0943   HGBUR NEGATIVE 09/20/2013 0943   BILIRUBINUR NEGATIVE 09/20/2013 0943   KETONESUR NEGATIVE 09/20/2013 0943   PROTEINUR NEGATIVE 10/18/2022 0848   UROBILINOGEN 0.2 09/20/2013 0943   NITRITE NEGATIVE 09/20/2013 0943   LEUKOCYTESUR NEGATIVE 09/20/2013 0943    C.Diff = neg  GI pathogen pnl = neg  MR ABD 5/17: IMPRESSION: 1. Significant technical limitation of prior examination dated 07/20/2022 makes direct comparison somewhat difficult, however there is no obvious change in heterogeneously enhancing liver lesions, largest of the inferior right lobe of the liver, hepatic segments V/VI, and in the superior left lobe of the liver, hepatic segment II. Findings remain consistent with multifocal hepatocellular carcinoma. 2. Cholelithiasis without evidence of acute cholecystitis.     Assessment and Plan: * AKI (acute kidney injury) (HCC) Creat 2.5 today up from 1.8 a couple of days ago. Likely pre-renal / ATN in setting of severe diarrhea. IVF: 1L bolus in ED Giving a 2nd 1L bolus now Then 125 cc/hr Strict intake and output Repeat BMP in AM  Diarrhea Pt with persistent diarrhea: C.Diff neg GI pathogen pnl neg At this point diarrhea is suspected to be due to either UC flare vs Carbometyx side effect. Carbometyx stopped x3 days ago No inflammation seen on MRI abd to suggest UC flare on 5/17; however, this didn't get the rectum.  Had CT pelvis with contrast scheduled for early June by GI Will get CT w/o contrast (due to renal fxn) of pelvis here PRN imodium Message sent to [facility] for AM consult  Hepatocellular carcinoma (HCC) Carbometyx put on hold x2 days ago  by heme/onc. Was due for Entyvio in 4 days, but suspect this going to be on hold too most likely.  Ulcerative colitis (HCC) H/o UC previously in remission but not clear if this causing diarrhea or not currently (see  above). CT pelvis And [facility] consult as discussed above  Type 2 diabetes mellitus with other specified complication (HCC) Hold home PO hypoglycemics Sensitive SSI Q4H for now.  Paroxysmal atrial fibrillation (HCC) Cont amiodarone Cont Eliquis  CKD (chronic kidney disease), stage III (HCC) Looks like GFR 40-50 at baseline.  Hypertension Pt with soft BPs in ED, holding home BP meds for now.      Advance Care Planning:   Code Status: Full Code  Consults: [facility]  Family Communication: Family at bedside  Severity of Illness: The appropriate patient status for this patient is OBSERVATION. Observation status is judged to be reasonable and necessary in order to provide the required intensity of service to ensure the patient's safety. The patient's presenting symptoms, physical exam findings, and initial radiographic and laboratory data in the context of their medical condition is felt to place them at decreased risk for further clinical deterioration. Furthermore, it is anticipated that the patient will be medically stable for discharge from the hospital within 2 midnights of admission.   Author: Hillary Bow., DO 01/03/2023 1:20 AM  For on call review [email or web address].

## 2023-01-03 NOTE — Assessment & Plan Note (Signed)
Pt with soft BPs in ED, holding home BP meds for now.

## 2023-01-03 NOTE — Chronic Care Management (AMB) (Signed)
   01/03/2023  Matthew Chen 05/24/1956 [id number]   Reason for Encounter: Patient is not currently enrolled in the CCM program. CCM status changed to previously enrolled.   [facility]/Chronic Care Management [phone]

## 2023-01-03 NOTE — Assessment & Plan Note (Signed)
Hold home PO hypoglycemics ?Sensitive SSI Q4H for now. ?

## 2023-01-04 DIAGNOSIS — K625 Hemorrhage of anus and rectum: Secondary | ICD-10-CM | POA: Diagnosis not present

## 2023-01-04 DIAGNOSIS — K6289 Other specified diseases of anus and rectum: Secondary | ICD-10-CM | POA: Diagnosis not present

## 2023-01-04 DIAGNOSIS — K5190 Ulcerative colitis, unspecified, without complications: Secondary | ICD-10-CM

## 2023-01-04 DIAGNOSIS — K51919 Ulcerative colitis, unspecified with unspecified complications: Secondary | ICD-10-CM | POA: Diagnosis not present

## 2023-01-04 DIAGNOSIS — R197 Diarrhea, unspecified: Secondary | ICD-10-CM | POA: Diagnosis not present

## 2023-01-04 DIAGNOSIS — N179 Acute kidney failure, unspecified: Secondary | ICD-10-CM | POA: Diagnosis not present

## 2023-01-04 DIAGNOSIS — Z7901 Long term (current) use of anticoagulants: Secondary | ICD-10-CM

## 2023-01-04 LAB — GLUCOSE, CAPILLARY
Glucose-Capillary: 101 mg/dL — ABNORMAL HIGH (ref 70–99)
Glucose-Capillary: 103 mg/dL — ABNORMAL HIGH (ref 70–99)
Glucose-Capillary: 95 mg/dL (ref 70–99)
Glucose-Capillary: 138 mg/dL — ABNORMAL HIGH (ref 70–99)

## 2023-01-04 LAB — BASIC METABOLIC PANEL
Anion gap: 7 (ref 5–15)
BUN: 26 mg/dL — ABNORMAL HIGH (ref 8–23)
CO2: 23 mmol/L (ref 22–32)
Calcium: 7.9 mg/dL — ABNORMAL LOW (ref 8.9–10.3)
Chloride: 107 mmol/L (ref 98–111)
Creatinine, Ser: 1.57 mg/dL — ABNORMAL HIGH (ref 0.61–1.24)
GFR, Estimated: 48 mL/min — ABNORMAL LOW (ref >60)
Glucose, Bld: 95 mg/dL (ref 70–99)
Potassium: 5 mmol/L (ref 3.5–5.1)
Sodium: 137 mmol/L (ref 135–145)

## 2023-01-04 LAB — CBC
HCT: 36 % — ABNORMAL LOW (ref 39.0–52.0)
Hemoglobin: 11.9 g/dL — ABNORMAL LOW (ref 13.0–17.0)
MCH: 28.1 pg (ref 26.0–34.0)
MCHC: 33.1 g/dL (ref 30.0–36.0)
MCV: 85.1 fL (ref 80.0–100.0)
Platelets: 199 10*3/uL (ref 150–400)
RBC: 4.23 MIL/uL (ref 4.22–5.81)
RDW: 26.9 % — ABNORMAL HIGH (ref 11.5–15.5)
WBC: 4.4 10*3/uL (ref 4.0–10.5)
nRBC: 0 % (ref 0.0–0.2)

## 2023-01-04 MED ORDER — CARVEDILOL 12.5 MG PO TABS
12.5000 mg | ORAL_TABLET | Freq: Two times a day (BID) | ORAL | Status: DC
  Administered 2023-01-04 – 2023-01-06 (×4): 12.5 mg via ORAL
  Filled 2023-01-04 (×4): qty 1

## 2023-01-04 MED ORDER — COLESTIPOL HCL 1 G PO TABS
1.0000 g | ORAL_TABLET | Freq: Two times a day (BID) | ORAL | Status: DC
  Administered 2023-01-04 – 2023-01-06 (×5): 1 g via ORAL
  Filled 2023-01-04 (×5): qty 1

## 2023-01-04 MED ORDER — DIPHENOXYLATE-ATROPINE 2.5-0.025 MG PO TABS
1.0000 | ORAL_TABLET | Freq: Three times a day (TID) | ORAL | Status: DC
  Administered 2023-01-04 – 2023-01-06 (×6): 1 via ORAL
  Filled 2023-01-04 (×6): qty 1

## 2023-01-04 NOTE — Progress Notes (Signed)
Progress Note   Subjective  Patient states diarrhea has persisted, not much better. He has had some rectal bleeding. Hgb has not changed much. Lidocaine / diltiazem ointment ordered from [facility] and he thinks it has helped his pain.    Objective   Vital signs in last 24 hours: Temp:  [97.2 F (36.2 C)-98.2 F (36.8 C)] 97.7 F (36.5 C) (05/25 0906) Pulse Rate:  [61-83] 68 (05/25 0906) Resp:  [12-19] 18 (05/25 0906) BP: (108-146)/(65-96) 133/80 (05/25 0906) SpO2:  [96 %-99 %] 98 % (05/25 0906) Weight:  [89.1 kg] 89.1 kg (05/24 1611) Last BM Date : 01/03/23 General:    white male in NAD Neurologic:  Alert and oriented,  grossly normal neurologically. Psych:  Cooperative. Normal mood and affect.  Intake/Output from previous day: 05/24 0701 - 05/25 0700 In: 3292.6 [P.O.:1440; I.V.:1852.6] Out: -  Intake/Output this shift: Total I/O In: 240 [P.O.:240] Out: -   Lab Results: Recent Labs    01/02/23 1843 01/03/23 0221 01/04/23 0709  WBC 5.0 4.5 4.4  HGB 13.4 12.5* 11.9*  HCT 39.8 38.2* 36.0*  PLT 212 163 199   BMET Recent Labs    01/02/23 1843 01/03/23 0221 01/04/23 0709  NA 129* 132* 137  K 4.1 3.9 5.0  CL 96* 99 107  CO2 18* 21* 23  GLUCOSE 169* 86 95  BUN 46* 41* 26*  CREATININE 2.56* 2.05* 1.57*  CALCIUM 8.1* 7.8* 7.9*   LFT Recent Labs    01/02/23 1843  PROT 6.2*  ALBUMIN 2.2*  AST 64*  ALT 46*  ALKPHOS 101  BILITOT 1.4*   PT/INR No results for input(s): "LABPROT", "INR" in the last 72 hours.  Studies/Results: CT PELVIS WO CONTRAST  Result Date: 01/03/2023 CLINICAL DATA:  Weakness and diarrhea. Evaluate for rectal infection. EXAM: CT PELVIS WITHOUT CONTRAST TECHNIQUE: Multidetector CT imaging of the pelvis was performed following the standard protocol without intravenous contrast. RADIATION DOSE REDUCTION: This exam was performed according to the departmental dose-optimization program which includes automated exposure control,  adjustment of the mA and/or kV according to patient size and/or use of iterative reconstruction technique. COMPARISON:  None Available. FINDINGS: Evaluation of this exam is limited in the absence of intravenous contrast. Urinary Tract: The visualized ureters and urinary bladder appear unremarkable. Bowel: No bowel dilatation or active inflammation in the pelvis. The appendix is normal. Vascular/Lymphatic: Moderate aortoiliac atherosclerotic disease. The iliac veins are grossly unremarkable. No pelvic adenopathy. Reproductive: The prostate and seminal vesicles are grossly unremarkable. No pelvic mass. Other:  None Musculoskeletal: Mild degenerative changes of the hips. No acute osseous pathology. IMPRESSION: 1. No acute intrapelvic pathology. 2.  Aortic Atherosclerosis (ICD10-I70.0). Electronically Signed   By: Elgie Collard M.D.   On: 01/03/2023 01:34       Assessment / Plan:    67 y/o male here with the following:  Diarrhea Rectal pain UC - on Entyvio HCC on chemotherapy  Diarrhea not much improved today. Rectal pain has improved. He is having some bleeding / pain secondary to distal rectal ulcer / fissure but this is mild / superficial. Hgb relatively stable. I have recommend topical diltiazem / lidocaine ointment - place pea sized amount into anal canal TID. His wife was supposed to pick it up at [facility] city pharmacy last night and bring to the hospital but I see only hydrocortisone cream on med list. Will clarify.   Otherwise his UC is not causing his diarrhea, his colonic mucosa appeared  okay. I took some biopsies. I discussed his case with Dr. Truett Perna who feels his diarrhea is very likely related to the Carbometyx and should be self limited and resolve with time since it has been held. He did not recommend steroids for this. Will switch immodim to lomotil, schedule TID and PRN, add empiric colestid. Hold Eliquis for now given his rectal bleeding. Hopefully he improves with more time. Renal  function is better today.  PLAN: - increase lomotil - adding colestid BID - diltiazem / lidocaine ointment for anal fissure / rectal ulcer as outlined  - await biopsy results - holding Eliquis for now given rectal bleeding  Call with questions we will reassess him tomorrow.  Harlin Rain, MD [facility]

## 2023-01-04 NOTE — Progress Notes (Signed)
PROGRESS NOTE    Matthew Chen  EAV:[id number] DOB: November 02, 1955 DOA: 01/02/2023 PCP: Donita Brooks, MD   Brief Narrative: Matthew Chen is a 67 y.o. male with a history of CKD stage III, diabetes mellitus type 2, hypertension, hepatocellular carcinoma on chemotherapy, ulcerative colitis, atrial fibrillation on Eliquis, C. difficile infection.  Patient presented secondary to weakness and diarrhea and was found to have associated AKI on admission.  Patient started on IV fluids.  GI consulted for diarrhea evaluation.   Assessment and Plan:  AKI on CKD stage IIIa Secondary to fluid loss from diarrhea.  Baseline creatinine appears to be around 1.5-1.8.  Creatinine of 2.56 on admission.  Creatinine improved to 1.57 with IV fluids.  Diarrhea has improved.  AKI resolved. -Continue IV fluids   Diarrhea Unclear etiology.  Concern that this may be related to ulcerative colitis.  Patient with elevated ESR and CRP.  GI pathogen panel and C. difficile panel are negative.  [facility] consulted with concern for possible chemotherapy-induced diarrhea versus possible ulcerative colitis flare.  GI performed flexible sigmoidoscopy on 5/24 which was significant for anal fissure solitary ulcer in the dentate line/anal canal with biopsy obtained. -Watch for continued diarrheal episodes -GI recommendations: Follow-up fecal calprotectin, follow-up biopsy results, discontinue Eliquis but okay to continue aspirin  GI bleeding Multiple episodes of GI bleeding with blood clots overnight and this morning.  Likely related to Eliquis use in addition to recent endoscopic procedure with biopsy performed. -Trend CBC   Hepatocellular carcinoma Patient follows with hematology oncology as an outpatient.  Patient was on Cabometyx which was held prior to admission.  Ulcerative colitis Known history.  Previously in remission; patient is also managed on Entyvio.  Concern for possible flare but this is unclear.  GI consulted  as mentioned above.  Diabetes type 2 Patient is on Jardiance Januvia, Actos as an outpatient. -Continue sliding scale insulin  Paroxysmal atrial fibrillation -Continue amiodarone and discontinue Eliquis  Primary hypertension Patient is on Coreg and lisinopril as an outpatient. -Resume Coreg and continue to hold lisinopril   DVT prophylaxis: Eliquis Code Status:   Code Status: Full Code Family Communication: Wife, granddaughter and grandson-in-law at bedside Disposition Plan: Discharge home pending GI recommendations and stable hemoglobin   Consultants:   GI  Procedures:  5/24: Flexible sigmoidoscopy  Antimicrobials: None    Subjective: Patient reports x-ray does bloody stool overnight.  Patient reports associated blood clots.  No other concerns.  Objective: BP 133/80 (BP Location: Left Arm)   Pulse 68   Temp 97.7 F (36.5 C) (Oral)   Resp 18   Ht 5\' 10"  (1.778 m)   Wt 89.1 kg   SpO2 98%   BMI 28.18 kg/m   Examination:  General exam: Appears calm and comfortable Respiratory system: Clear to auscultation. Respiratory effort normal. Cardiovascular system: S1 & S2 heard, RRR. Gastrointestinal system: Abdomen is nondistended, soft and nontender. No organomegaly or masses felt. Normal bowel sounds heard. Central nervous system: Alert and oriented. No focal neurological deficits. Musculoskeletal: No edema. No calf tenderness Skin: No cyanosis. No rashes Psychiatry: Judgement and insight appear normal. Mood & affect appropriate.    Data Reviewed: I have personally reviewed following labs and imaging studies  CBC Lab Results  Component Value Date   WBC 4.4 01/04/2023   RBC 4.23 01/04/2023   HGB 11.9 (L) 01/04/2023   HCT 36.0 (L) 01/04/2023   MCV 85.1 01/04/2023   MCH 28.1 01/04/2023   PLT 199 01/04/2023  MCHC 33.1 01/04/2023   RDW 26.9 (H) 01/04/2023   LYMPHSABS 1.5 12/31/2022   MONOABS 0.8 12/31/2022   EOSABS 0.1 12/31/2022   BASOSABS 0.1  12/31/2022     Last metabolic panel Lab Results  Component Value Date   NA 137 01/04/2023   K 5.0 01/04/2023   CL 107 01/04/2023   CO2 23 01/04/2023   BUN 26 (H) 01/04/2023   CREATININE 1.57 (H) 01/04/2023   GLUCOSE 95 01/04/2023   GFRNONAA 48 (L) 01/04/2023   GFRAA 49 (L) 11/20/2020   CALCIUM 7.9 (L) 01/04/2023   PROT 6.2 (L) 01/02/2023   ALBUMIN 2.2 (L) 01/02/2023   LABGLOB 3.1 12/24/2022   AGRATIO 1.0 (L) 12/24/2022   BILITOT 1.4 (H) 01/02/2023   ALKPHOS 101 01/02/2023   AST 64 (H) 01/02/2023   ALT 46 (H) 01/02/2023   ANIONGAP 7 01/04/2023    GFR: Estimated Creatinine Clearance: 51.3 mL/min (A) (by C-G formula based on SCr of 1.57 mg/dL (H)).  Recent Results (from the past 240 hour(s))  Clostridium Difficile by PCR     Status: None   Collection Time: 12/31/22  9:54 AM   Specimen: Stool   Stool  Result Value Ref Range Status   Toxigenic C. Difficile by PCR Negative Negative Final      Radiology Studies: CT PELVIS WO CONTRAST  Result Date: 01/03/2023 CLINICAL DATA:  Weakness and diarrhea. Evaluate for rectal infection. EXAM: CT PELVIS WITHOUT CONTRAST TECHNIQUE: Multidetector CT imaging of the pelvis was performed following the standard protocol without intravenous contrast. RADIATION DOSE REDUCTION: This exam was performed according to the departmental dose-optimization program which includes automated exposure control, adjustment of the mA and/or kV according to patient size and/or use of iterative reconstruction technique. COMPARISON:  None Available. FINDINGS: Evaluation of this exam is limited in the absence of intravenous contrast. Urinary Tract: The visualized ureters and urinary bladder appear unremarkable. Bowel: No bowel dilatation or active inflammation in the pelvis. The appendix is normal. Vascular/Lymphatic: Moderate aortoiliac atherosclerotic disease. The iliac veins are grossly unremarkable. No pelvic adenopathy. Reproductive: The prostate and seminal  vesicles are grossly unremarkable. No pelvic mass. Other:  None Musculoskeletal: Mild degenerative changes of the hips. No acute osseous pathology. IMPRESSION: 1. No acute intrapelvic pathology. 2.  Aortic Atherosclerosis (ICD10-I70.0). Electronically Signed   By: Elgie Collard M.D.   On: 01/03/2023 01:34      LOS: 1 day    Jacquelin Hawking, MD [facility] 01/04/2023, 9:33 AM   If 7PM-7AM, please contact night-coverage [email or web address]

## 2023-01-04 NOTE — Plan of Care (Signed)
  Problem: Education: Goal: Ability to describe self-care measures that may prevent or decrease complications (Diabetes Survival Skills Education) will improve Outcome: Progressing Pt has a medical hx of DM2.  Per MD's orders his BSBG is checked ACHS.  So far his BSBG have been WNL and did not need insulin coverage.   Problem: Coping: Goal: Ability to adjust to condition or change in health will improve Outcome: Progressing Pt has an understanding of his hospital admission.  He had a flex sigmoid on 01/03/2023.  D/T pt has a hx of A.Fib, he is on ASA and Eliquis, after the flex sig he began having GIB.  GI MD has been consulted and has explained to pt why the bleeding has occurred.  Pt has verbalized an understanding of his prognosis.     Problem: Fluid Volume: Goal: Ability to maintain a balanced intake and output will improve Outcome: Progressing Pt is IVF per MD's orders with strict I/Os.   Problem: Nutritional: Goal: Maintenance of adequate nutrition will improve Outcome: Progressing Pt was observed eating 95% of his breakfast.  He stated the he was not hungry at lunch time.  Offered pt a snack and he refused thus far.   Problem: Skin Integrity: Goal: Risk for impaired skin integrity will decrease Outcome: Progressing Skin integrity monitored and assessed q-shift.  Instructed pt to turn q2 hours to prevent further skin impairment. Tubes and drains assessed for device related pressure sores. Pt is continent of his bowel and bladder.  He is ambulatory and can care to his perineal needs.     Problem: Education: Goal: Ability to identify signs and symptoms of gastrointestinal bleeding will improve Outcome: Progressing Pt has an understanding of his hospital admission.  He had a flex sigmoid on 01/03/2023.  D/T pt has a hx of A.Fib, he is on ASA and Eliquis, after the flex sig he began having GIB.  GI MD has been consulted and has explained to pt why the bleeding has occurred.  Pt has  verbalized an understanding of his prognosis.  He has had 3x burgundy bloody BMs with blood clots thus far.  GI team came and revamp pt's medication list to prevent GIB.

## 2023-01-05 DIAGNOSIS — K625 Hemorrhage of anus and rectum: Secondary | ICD-10-CM | POA: Diagnosis not present

## 2023-01-05 DIAGNOSIS — K6289 Other specified diseases of anus and rectum: Secondary | ICD-10-CM | POA: Diagnosis not present

## 2023-01-05 DIAGNOSIS — K5190 Ulcerative colitis, unspecified, without complications: Secondary | ICD-10-CM | POA: Diagnosis not present

## 2023-01-05 DIAGNOSIS — K51919 Ulcerative colitis, unspecified with unspecified complications: Secondary | ICD-10-CM | POA: Diagnosis not present

## 2023-01-05 DIAGNOSIS — R197 Diarrhea, unspecified: Secondary | ICD-10-CM | POA: Diagnosis not present

## 2023-01-05 DIAGNOSIS — N179 Acute kidney failure, unspecified: Secondary | ICD-10-CM | POA: Diagnosis not present

## 2023-01-05 LAB — BASIC METABOLIC PANEL
Anion gap: 6 (ref 5–15)
BUN: 21 mg/dL (ref 8–23)
CO2: 23 mmol/L (ref 22–32)
Calcium: 7.3 mg/dL — ABNORMAL LOW (ref 8.9–10.3)
Chloride: 109 mmol/L (ref 98–111)
Creatinine, Ser: 1.34 mg/dL — ABNORMAL HIGH (ref 0.61–1.24)
GFR, Estimated: 58 mL/min — ABNORMAL LOW (ref >60)
Glucose, Bld: 94 mg/dL (ref 70–99)
Potassium: 3.9 mmol/L (ref 3.5–5.1)
Sodium: 138 mmol/L (ref 135–145)

## 2023-01-05 LAB — CBC
HCT: 28.9 % — ABNORMAL LOW (ref 39.0–52.0)
Hemoglobin: 9.6 g/dL — ABNORMAL LOW (ref 13.0–17.0)
MCH: 28.6 pg (ref 26.0–34.0)
MCHC: 33.2 g/dL (ref 30.0–36.0)
MCV: 86 fL (ref 80.0–100.0)
Platelets: 151 10*3/uL (ref 150–400)
RBC: 3.36 MIL/uL — ABNORMAL LOW (ref 4.22–5.81)
RDW: 26.7 % — ABNORMAL HIGH (ref 11.5–15.5)
WBC: 3.7 10*3/uL — ABNORMAL LOW (ref 4.0–10.5)
nRBC: 0 % (ref 0.0–0.2)

## 2023-01-05 LAB — GLUCOSE, CAPILLARY
Glucose-Capillary: 97 mg/dL (ref 70–99)
Glucose-Capillary: 80 mg/dL (ref 70–99)
Glucose-Capillary: 94 mg/dL (ref 70–99)

## 2023-01-05 NOTE — Progress Notes (Signed)
    Progress Note   Subjective  Chief Complaint: Diarrhea/ Rectal Pain  Today, patient tells me he is actually feeling much better.  He passed 2 semisolid stools though they were still urgent this morning.  He has not seen any blood in his past 2 bowel movements.  Successfully using the Diltiazem/lidocaine ointment for fissure.  Describes getting the Colestid yesterday.    Objective   Vital signs in last 24 hours: Temp:  [97.4 F (36.3 C)-98.2 F (36.8 C)] 97.5 F (36.4 C) (05/26 0809) Pulse Rate:  [58-81] 78 (05/26 0809) Resp:  [14-18] 16 (05/26 0809) BP: (124-143)/(67-81) 143/81 (05/26 0809) SpO2:  [96 %-99 %] 99 % (05/26 0809) Last BM Date : 01/05/23 General:    white male in NAD Heart:  Regular rate and rhythm; no murmurs Lungs: Respirations even and unlabored, lungs CTA bilaterally Abdomen:  Soft, nontender and nondistended. Normal bowel sounds. Psych:  Cooperative. Normal mood and affect.  Intake/Output from previous day: 05/25 0701 - 05/26 0700 In: 240 [P.O.:240] Out: -  Intake/Output this shift: Total I/O In: 480 [P.O.:480] Out: -   Lab Results: Recent Labs    01/03/23 0221 01/04/23 0709 01/05/23 0315  WBC 4.5 4.4 3.7*  HGB 12.5* 11.9* 9.6*  HCT 38.2* 36.0* 28.9*  PLT 163 199 151   BMET Recent Labs    01/03/23 0221 01/04/23 0709 01/05/23 0315  NA 132* 137 138  K 3.9 5.0 3.9  CL 99 107 109  CO2 21* 23 23  GLUCOSE 86 95 94  BUN 41* 26* 21  CREATININE 2.05* 1.57* 1.34*  CALCIUM 7.8* 7.9* 7.3*   LFT Recent Labs    01/02/23 1843  PROT 6.2*  ALBUMIN 2.2*  AST 64*  ALT 46*  ALKPHOS 101  BILITOT 1.4*    Assessment / Plan:   Assessment: 1.  Diarrhea: Thought related to CarboMetyx, patient is better with even semiformed stools over the past 24 hours on Colestid twice daily and Lomotil at increased dosage, flex sigmoidoscopy showed that colon mucosa was normal, so UC is likely not contributing 2.  Rectal pain 3.  UC 4.  HCC on  chemotherapy  Plan: 1.  Patient actually had some solid stools today, he is discussing possibly going home.  His wife is a little nervous because if he starts back with diarrhea she is not sure that he will not end right back here.  Explained that conservatively we can keep him overnight and make sure that things are going well. 2.  Continue Colestid twice daily and Lomotil at increased dose.  These are both medicines that he can go home with. 3.  Could likely consider adding back in Eliquis over the next 24 hours as rectal bleeding is slowed  Thank you for your kind consultation, we will continue to follow.    LOS: 2 days   Unk Lightning  01/05/2023, 10:37 AM

## 2023-01-05 NOTE — Plan of Care (Signed)
Patient AOX4, VSS throughout shift.  All meds given on time as ordered.  Diminished lungs, IS encouraged.  Pt ambulated with steady gait to bathroom.  Pt had multiple Bms o/n, mostly liquid brown and red, no clots seen.  Last BM pt had showed partly formed brown stool without blood.  POC maintained, will continue to monitor.  Problem: Education: Goal: Ability to describe self-care measures that may prevent or decrease complications (Diabetes Survival Skills Education) will improve Outcome: Progressing Goal: Individualized Educational Video(s) Outcome: Progressing   Problem: Coping: Goal: Ability to adjust to condition or change in health will improve Outcome: Progressing   Problem: Fluid Volume: Goal: Ability to maintain a balanced intake and output will improve Outcome: Progressing   Problem: Health Behavior/Discharge Planning: Goal: Ability to identify and utilize available resources and services will improve Outcome: Progressing Goal: Ability to manage health-related needs will improve Outcome: Progressing   Problem: Metabolic: Goal: Ability to maintain appropriate glucose levels will improve Outcome: Progressing   Problem: Nutritional: Goal: Maintenance of adequate nutrition will improve Outcome: Progressing Goal: Progress toward achieving an optimal weight will improve Outcome: Progressing   Problem: Skin Integrity: Goal: Risk for impaired skin integrity will decrease Outcome: Progressing   Problem: Tissue Perfusion: Goal: Adequacy of tissue perfusion will improve Outcome: Progressing   Problem: Education: Goal: Ability to identify signs and symptoms of gastrointestinal bleeding will improve Outcome: Progressing   Problem: Bowel/Gastric: Goal: Will show no signs and symptoms of gastrointestinal bleeding Outcome: Progressing   Problem: Fluid Volume: Goal: Will show no signs and symptoms of excessive bleeding Outcome: Progressing   Problem: Clinical  Measurements: Goal: Complications related to the disease process, condition or treatment will be avoided or minimized Outcome: Progressing   Problem: Education: Goal: Knowledge of General Education information will improve Description: Including pain rating scale, medication(s)/side effects and non-pharmacologic comfort measures Outcome: Progressing   Problem: Health Behavior/Discharge Planning: Goal: Ability to manage health-related needs will improve Outcome: Progressing   Problem: Clinical Measurements: Goal: Ability to maintain clinical measurements within normal limits will improve Outcome: Progressing Goal: Will remain free from infection Outcome: Progressing Goal: Diagnostic test results will improve Outcome: Progressing Goal: Respiratory complications will improve Outcome: Progressing Goal: Cardiovascular complication will be avoided Outcome: Progressing   Problem: Activity: Goal: Risk for activity intolerance will decrease Outcome: Progressing   Problem: Nutrition: Goal: Adequate nutrition will be maintained Outcome: Progressing   Problem: Coping: Goal: Level of anxiety will decrease Outcome: Progressing   Problem: Elimination: Goal: Will not experience complications related to bowel motility Outcome: Progressing Goal: Will not experience complications related to urinary retention Outcome: Progressing   Problem: Pain Managment: Goal: General experience of comfort will improve Outcome: Progressing   Problem: Safety: Goal: Ability to remain free from injury will improve Outcome: Progressing   Problem: Skin Integrity: Goal: Risk for impaired skin integrity will decrease Outcome: Progressing

## 2023-01-05 NOTE — Progress Notes (Signed)
PROGRESS NOTE    NIEGEL ZEHNER  ZOX:[id number] DOB: Jul 27, 1956 DOA: 01/02/2023 PCP: Donita Brooks, MD   Brief Narrative: Matthew Chen is a 67 y.o. male with a history of CKD stage III, diabetes mellitus type 2, hypertension, hepatocellular carcinoma on chemotherapy, ulcerative colitis, atrial fibrillation on Eliquis, C. difficile infection.  Patient presented secondary to weakness and diarrhea and was found to have associated AKI on admission.  Patient started on IV fluids.  GI consulted for diarrhea evaluation.   Assessment and Plan:  AKI on CKD stage IIIa Secondary to fluid loss from diarrhea.  Baseline creatinine appears to be around 1.5-1.8.  Creatinine of 2.56 on admission.  Creatinine improved to 1.57 with IV fluids.  Diarrhea has improved.  AKI resolved.   Diarrhea Unclear etiology.  Concern that this may be related to ulcerative colitis.  Patient with elevated ESR and CRP.  GI pathogen panel and C. difficile panel are negative.  [facility] consulted with concern for possible chemotherapy-induced diarrhea versus possible ulcerative colitis flare.  GI performed flexible sigmoidoscopy on 5/24 which was significant for anal fissure solitary ulcer in the dentate line/anal canal with biopsy obtained. -Watch for continued diarrheal episodes -GI recommendations: Follow-up fecal calprotectin, follow-up biopsy results, discontinue Eliquis but okay to continue aspirin  GI bleeding Multiple episodes of GI bleeding with blood clots overnight and this morning.  Likely related to Eliquis use in addition to recent endoscopic procedure with biopsy performed. Hemoglobin drifty down from 11.9 to 9.6. No recurrent GI bleeding overnight -Trend CBC   Hepatocellular carcinoma Patient follows with hematology oncology as an outpatient.  Patient was on Cabometyx which was held prior to admission.  Ulcerative colitis Known history.  Previously in remission; patient is also managed on Entyvio.  Concern  for possible flare but this is unclear.  GI consulted as mentioned above.  Diabetes type 2 Patient is on Jardiance Januvia, Actos as an outpatient. -Continue sliding scale insulin  Paroxysmal atrial fibrillation -Continue amiodarone and discontinue Eliquis  Primary hypertension Patient is on Coreg and lisinopril as an outpatient. -Continue Coreg and continue to hold lisinopril   DVT prophylaxis: Eliquis Code Status:   Code Status: Full Code Family Communication: None at bedside Disposition Plan: Discharge home likely in 24 hours pending GI recommendations and stable hemoglobin   Consultants:  [facility]  Procedures:  5/24: Flexible sigmoidoscopy  Antimicrobials: None    Subjective: Patient reports a large bowel movement overnight.  No hematochezia noted.  No melena noted.  Objective: BP (!) 143/81 (BP Location: Left Arm)   Pulse 78   Temp (!) 97.5 F (36.4 C) (Oral)   Resp 16   Ht 5\' 10"  (1.778 m)   Wt 89.1 kg   SpO2 99%   BMI 28.18 kg/m   Examination:  General exam: Appears calm and comfortable Respiratory system: Clear to auscultation. Respiratory effort normal. Cardiovascular system: S1 & S2 heard, RRR. Gastrointestinal system: Abdomen is nondistended, soft and nontender. Normal bowel sounds heard. Central nervous system: Alert and oriented. No focal neurological deficits. Musculoskeletal: No edema. No calf tenderness Skin: No cyanosis. No rashes Psychiatry: Judgement and insight appear normal. Mood & affect appropriate.    Data Reviewed: I have personally reviewed following labs and imaging studies  CBC Lab Results  Component Value Date   WBC 3.7 (L) 01/05/2023   RBC 3.36 (L) 01/05/2023   HGB 9.6 (L) 01/05/2023   HCT 28.9 (L) 01/05/2023   MCV 86.0 01/05/2023   MCH  28.6 01/05/2023   PLT 151 01/05/2023   MCHC 33.2 01/05/2023   RDW 26.7 (H) 01/05/2023   LYMPHSABS 1.5 12/31/2022   MONOABS 0.8 12/31/2022   EOSABS 0.1 12/31/2022   BASOSABS 0.1  12/31/2022     Last metabolic panel Lab Results  Component Value Date   NA 138 01/05/2023   K 3.9 01/05/2023   CL 109 01/05/2023   CO2 23 01/05/2023   BUN 21 01/05/2023   CREATININE 1.34 (H) 01/05/2023   GLUCOSE 94 01/05/2023   GFRNONAA 58 (L) 01/05/2023   GFRAA 49 (L) 11/20/2020   CALCIUM 7.3 (L) 01/05/2023   PROT 6.2 (L) 01/02/2023   ALBUMIN 2.2 (L) 01/02/2023   LABGLOB 3.1 12/24/2022   AGRATIO 1.0 (L) 12/24/2022   BILITOT 1.4 (H) 01/02/2023   ALKPHOS 101 01/02/2023   AST 64 (H) 01/02/2023   ALT 46 (H) 01/02/2023   ANIONGAP 6 01/05/2023    GFR: Estimated Creatinine Clearance: 60.1 mL/min (A) (by C-G formula based on SCr of 1.34 mg/dL (H)).  Recent Results (from the past 240 hour(s))  Clostridium Difficile by PCR     Status: None   Collection Time: 12/31/22  9:54 AM   Specimen: Stool   Stool  Result Value Ref Range Status   Toxigenic C. Difficile by PCR Negative Negative Final      Radiology Studies: No results found.    LOS: 2 days    Jacquelin Hawking, MD [facility] 01/05/2023, 12:06 PM   If 7PM-7AM, please contact night-coverage [email or web address]

## 2023-01-05 NOTE — Plan of Care (Signed)
  Problem: Pain Managment: Goal: General experience of comfort will improve Outcome: Progressing   Problem: Safety: Goal: Ability to remain free from injury will improve Outcome: Progressing   Problem: Skin Integrity: Goal: Risk for impaired skin integrity will decrease Outcome: Progressing   

## 2023-01-06 ENCOUNTER — Encounter (HOSPITAL_COMMUNITY): Payer: Self-pay | Admitting: Gastroenterology

## 2023-01-06 DIAGNOSIS — C220 Liver cell carcinoma: Secondary | ICD-10-CM | POA: Diagnosis not present

## 2023-01-06 DIAGNOSIS — K6289 Other specified diseases of anus and rectum: Secondary | ICD-10-CM | POA: Diagnosis not present

## 2023-01-06 DIAGNOSIS — K5190 Ulcerative colitis, unspecified, without complications: Secondary | ICD-10-CM | POA: Diagnosis not present

## 2023-01-06 DIAGNOSIS — K602 Anal fissure, unspecified: Secondary | ICD-10-CM

## 2023-01-06 DIAGNOSIS — R197 Diarrhea, unspecified: Secondary | ICD-10-CM | POA: Diagnosis not present

## 2023-01-06 DIAGNOSIS — N179 Acute kidney failure, unspecified: Secondary | ICD-10-CM | POA: Diagnosis not present

## 2023-01-06 LAB — VEDOLIZUMAB AND ANTI-VEDO AB

## 2023-01-06 LAB — GLUCOSE, CAPILLARY: Glucose-Capillary: 150 mg/dL — ABNORMAL HIGH (ref 70–99)

## 2023-01-06 LAB — CBC
HCT: 28.6 % — ABNORMAL LOW (ref 39.0–52.0)
Hemoglobin: 9.3 g/dL — ABNORMAL LOW (ref 13.0–17.0)
MCH: 28.1 pg (ref 26.0–34.0)
MCHC: 32.5 g/dL (ref 30.0–36.0)
MCV: 86.4 fL (ref 80.0–100.0)
Platelets: 153 10*3/uL (ref 150–400)
RBC: 3.31 MIL/uL — ABNORMAL LOW (ref 4.22–5.81)
RDW: 26.9 % — ABNORMAL HIGH (ref 11.5–15.5)
WBC: 3.2 10*3/uL — ABNORMAL LOW (ref 4.0–10.5)
nRBC: 0 % (ref 0.0–0.2)

## 2023-01-06 MED ORDER — APIXABAN 5 MG PO TABS
5.0000 mg | ORAL_TABLET | Freq: Two times a day (BID) | ORAL | Status: DC
Start: 2023-01-08 — End: 2023-04-28

## 2023-01-06 MED ORDER — COLESTIPOL HCL 1 G PO TABS: 1.0000 g | ORAL_TABLET | Freq: Two times a day (BID) | ORAL | 0 refills | Status: DC

## 2023-01-06 MED ORDER — DIPHENOXYLATE-ATROPINE 2.5-0.025 MG PO TABS: 1.0000 | ORAL_TABLET | Freq: Four times a day (QID) | ORAL | 0 refills | Status: DC

## 2023-01-06 NOTE — Discharge Summary (Signed)
Physician Discharge Summary   Patient: Matthew Chen MRN: [id number] DOB: 11-24-1955  Admit date:     01/02/2023  Discharge date: 01/06/23  Discharge Physician: Rickey Primus   PCP: Donita Brooks, MD   Recommendations at discharge:  PCP follow-up GI follow-up Resume Eliquis in 2 days  Discharge Diagnoses: Principal Problem:   AKI (acute kidney injury) (HCC) Active Problems:   Diarrhea   Ulcerative colitis (HCC)   Hepatocellular carcinoma (HCC)   Hypertension   CKD (chronic kidney disease), stage III (HCC)   Paroxysmal atrial fibrillation (HCC)   Type 2 diabetes mellitus with other specified complication (HCC)   Rectal pain   Anal fissure  Resolved Problems:   * No resolved hospital problems. *  Hospital Course: Matthew Chen is a 67 y.o. male with a history of CKD stage III, diabetes mellitus type 2, hypertension, hepatocellular carcinoma on chemotherapy, ulcerative colitis, atrial fibrillation on Eliquis, C. difficile infection.  Patient presented secondary to weakness and diarrhea and was found to have associated AKI on admission.  Patient started on IV fluids.  GI consulted for diarrhea evaluation.  Assessment and Plan:  AKI on CKD stage IIIa Secondary to fluid loss from diarrhea.  Baseline creatinine appears to be around 1.5-1.8.  Creatinine of 2.56 on admission.  Creatinine improved to 1.57 with IV fluids.  Diarrhea has improved.  AKI resolved.   Diarrhea Unclear etiology.  Concern that this may be related to ulcerative colitis.  Patient with elevated ESR and CRP.  GI pathogen panel and C. difficile panel are negative.  [facility] consulted with concern for possible chemotherapy-induced diarrhea versus possible ulcerative colitis flare.  GI performed flexible sigmoidoscopy on 5/24 which was significant for anal fissure solitary ulcer in the dentate line/anal canal with biopsy obtained. Lomotil on discharge. GI follow-up for biopsy results. Resume Eliquis in 2 days per  GI recommendations.   GI bleeding Multiple episodes of GI bleeding with blood clots overnight and this morning.  Likely related to Eliquis use in addition to recent endoscopic procedure with biopsy performed. Hemoglobin drifty down from 11.9 to 9.6. No recurrent GI bleeding overnight. Hemoglobin stable at 9.3 prior to discharge.   Hepatocellular carcinoma Patient follows with hematology oncology as an outpatient.  Patient was on Cabometyx which was held prior to admission.   Ulcerative colitis Known history.  Previously in remission; patient is also managed on Entyvio.  Concern for possible flare but this is unclear.  GI consulted as mentioned above.   Diabetes type 2 Patient is on Jardiance Januvia, Actos as an outpatient. Continue on discharge.   Paroxysmal atrial fibrillation Continue amiodarone. Recommendation to resume Eliquis in two days (5/29).   Primary hypertension Patient is on Coreg and lisinopril as an outpatient. Continue regimen on discharge.   Consultants: [facility] Procedures performed:  5/24: Flexible sigmoidoscopy Disposition: Home Diet recommendation: Carb modified diet   DISCHARGE MEDICATION: Allergies as of 01/06/2023       Reactions   Penicillins Other (See Comments)   Unsure, childhood reaction Has patient had a PCN reaction causing immediate rash, facial/tongue/throat swelling, SOB or lightheadedness with hypotension: Unknown Has patient had a PCN reaction causing severe rash involving mucus membranes or skin necrosis: Unknown Has patient had a PCN reaction that required hospitalization: No Has patient had a PCN reaction occurring within the last 10 years: No If all of the above answers are "NO", then may proceed with Cephalosporin use.        Medication List  TAKE these medications    AMBULATORY NON FORMULARY MEDICATION Diltiazem 2%/Lidocaine 2% apply a pea sized amount PR TID   amiodarone 200 MG tablet Commonly known as:  PACERONE Take 1 tablet (200 mg total) by mouth daily.   apixaban 5 MG Tabs tablet Commonly known as: Eliquis Take 1 tablet (5 mg total) by mouth 2 (two) times daily. Start taking on: Jan 08, 2023 What changed: These instructions start on Jan 08, 2023. If you are unsure what to do until then, ask your doctor or other care provider.   aspirin EC 81 MG tablet Take 1 tablet (81 mg total) by mouth daily.   atorvastatin 80 MG tablet Commonly known as: LIPITOR Take 1 tablet (80 mg total) by mouth daily.   B-D UF III MINI PEN NEEDLES 31G X 5 MM Misc Generic drug: Insulin Pen Needle Use daily with Victoza   carvedilol 12.5 MG tablet Commonly known as: COREG Take 1 tablet (12.5 mg total) by mouth 2 (two) times daily with a meal.   colestipol 1 g tablet Commonly known as: COLESTID Take 1 tablet (1 g total) by mouth 2 (two) times daily.   cyanocobalamin 1000 MCG tablet Commonly known as: VITAMIN B12 Take 1,000 mcg by mouth daily.   diltiazem 30 MG tablet Commonly known as: Cardizem Take 1 tablet (30 mg total) by mouth 3 (three) times daily as needed (palpitations).   diphenoxylate-atropine 2.5-0.025 MG tablet Commonly known as: Lomotil Take 1-2 tablets by mouth 4 (four) times daily. What changed:  how much to take when to take this   empagliflozin 10 MG Tabs tablet Commonly known as: JARDIANCE Take 1 tablet (10 mg total) by mouth daily.   Entyvio 300 MG injection Generic drug: vedolizumab Inject 300 mg as directed See admin instructions. Every 2 months   fenofibrate 160 MG tablet Take 1 tablet (160 mg total) by mouth daily.   Fish Oil 1000 MG Caps Take 1,000 mg by mouth 2 (two) times daily.   Lidocaine-Menthol (Spray) 4-1 % Liqd Apply 1 spray topically as needed.   lisinopril 20 MG tablet Commonly known as: ZESTRIL Take 1 tablet (20 mg total) by mouth daily.   magic mouthwash w/lidocaine Soln Take 5 mLs by mouth every 6 (six) hours as needed for mouth pain.    mesalamine 1000 MG suppository Commonly known as: CANASA Place 1 suppository (1,000 mg total) rectally at bedtime.   OneTouch Ultra test strip Generic drug: glucose blood CHECK BLOOD SUGAR TWICE A DAY   oxyCODONE 5 MG immediate release tablet Commonly known as: Roxicodone Take 1 tablet (5 mg total) by mouth every 4 (four) hours as needed for severe pain.   Pain Relief Extra Strength 500 MG tablet Generic drug: acetaminophen Take 1 tablet (500 mg total) by mouth every 6 (six) hours as needed. What changed: reasons to take this   pantoprazole 40 MG tablet Commonly known as: PROTONIX Take 1 tablet (40 mg total) by mouth 2 (two) times daily.   pioglitazone 30 MG tablet Commonly known as: ACTOS TAKE 1 TABLET BY MOUTH EVERY DAY. STRENGTH: 30 MG What changed:  how much to take how to take this when to take this additional instructions   sildenafil 100 MG tablet Commonly known as: Viagra Take 0.5-1 tablets (50-100 mg total) by mouth daily as needed for erectile dysfunction.   sitaGLIPtin 100 MG tablet Commonly known as: Januvia Take 1 tablet (100 mg total) by mouth daily.        Follow-up  Information     Armbruster, Willaim Rayas, MD. Call.   Specialty: Gastroenterology Why: Biopsy results Contact information: [address] [place] [address] [phone]         Donita Brooks, MD. Schedule an appointment as soon as possible for a visit in 1 week(s).   Specialty: Family Medicine Why: For hospital follow-up Contact information: [address] [place] [address] [phone]                Discharge Exam: BP 104/80 (BP Location: Right Arm)   Pulse 80   Temp 97.7 F (36.5 C) (Oral)   Resp 16   Ht 5\' 10"  (1.778 m)   Wt 89.1 kg   SpO2 97%   BMI 28.18 kg/m   General exam: Appears calm and comfortable Respiratory system: Clear to auscultation. Respiratory effort normal. Cardiovascular system: S1 & S2 heard, RRR. No murmurs,  rubs, gallops or clicks. Gastrointestinal system: Abdomen is nondistended, soft and nontender. Normal bowel sounds heard. Central nervous system: Alert and oriented. No focal neurological deficits. Musculoskeletal: No edema. No calf tenderness Skin: No cyanosis. No rashes Psychiatry: Judgement and insight appear normal. Mood & affect appropriate.   Condition at discharge: stable  The results of significant diagnostics from this hospitalization (including imaging, microbiology, ancillary and laboratory) are listed below for reference.   Imaging Studies: CT PELVIS WO CONTRAST  Result Date: 01/03/2023 CLINICAL DATA:  Weakness and diarrhea. Evaluate for rectal infection. EXAM: CT PELVIS WITHOUT CONTRAST TECHNIQUE: Multidetector CT imaging of the pelvis was performed following the standard protocol without intravenous contrast. RADIATION DOSE REDUCTION: This exam was performed according to the departmental dose-optimization program which includes automated exposure control, adjustment of the mA and/or kV according to patient size and/or use of iterative reconstruction technique. COMPARISON:  None Available. FINDINGS: Evaluation of this exam is limited in the absence of intravenous contrast. Urinary Tract: The visualized ureters and urinary bladder appear unremarkable. Bowel: No bowel dilatation or active inflammation in the pelvis. The appendix is normal. Vascular/Lymphatic: Moderate aortoiliac atherosclerotic disease. The iliac veins are grossly unremarkable. No pelvic adenopathy. Reproductive: The prostate and seminal vesicles are grossly unremarkable. No pelvic mass. Other:  None Musculoskeletal: Mild degenerative changes of the hips. No acute osseous pathology. IMPRESSION: 1. No acute intrapelvic pathology. 2.  Aortic Atherosclerosis (ICD10-I70.0). Electronically Signed   By: Elgie Collard M.D.   On: 01/03/2023 01:34   MR Abdomen W Wo Contrast  Result Date: 12/27/2022 CLINICAL DATA:   Hepatocellular carcinoma restaging EXAM: MRI ABDOMEN WITHOUT AND WITH CONTRAST TECHNIQUE: Multiplanar multisequence MR imaging of the abdomen was performed both before and after the administration of intravenous contrast. CONTRAST:  9 mL Vueway gadolinium contrast IV COMPARISON:  07/20/2022 FINDINGS: Lower chest: No acute abnormality. Hepatobiliary: Significant technical limitation of prior examination dated 07/20/2022 makes direct comparison somewhat difficult, however there is no obvious change in heterogeneously enhancing liver lesions, largest of the inferior right lobe of the liver, hepatic segments V/VI measuring at least 9.5 x 4.6 cm with overlying capsular retraction (series 11, image 44), and in the superior left lobe of the liver, hepatic segment II measuring 2.8 x 2.6 cm (series 11, image 13). Contracted gallbladder containing numerous tiny stones. Gallbladder wall thickening, or biliary dilatation. Pancreas: Unremarkable. No pancreatic ductal dilatation or surrounding inflammatory changes. Spleen: Normal in size without significant abnormality. Adrenals/Urinary Tract: Adrenal glands are unremarkable. Kidneys are normal, without renal calculi, solid lesion, or hydronephrosis. Stomach/Bowel: Stomach is within  normal limits. No evidence of bowel wall thickening, distention, or inflammatory changes. Vascular/Lymphatic: No significant vascular findings are present. No enlarged abdominal lymph nodes. Other: No abdominal wall hernia or abnormality. No ascites. Musculoskeletal: No acute or significant osseous findings. IMPRESSION: 1. Significant technical limitation of prior examination dated 07/20/2022 makes direct comparison somewhat difficult, however there is no obvious change in heterogeneously enhancing liver lesions, largest of the inferior right lobe of the liver, hepatic segments V/VI, and in the superior left lobe of the liver, hepatic segment II. Findings remain consistent with multifocal  hepatocellular carcinoma. 2. Cholelithiasis without evidence of acute cholecystitis. Electronically Signed   By: Jearld Lesch M.D.   On: 12/27/2022 16:53   VAS [place] LOWER EXTREMITY ARTERIAL DUPLEX  Result Date: 12/24/2022 LOWER EXTREMITY ARTERIAL DUPLEX STUDY Patient Name:  TRUEMAN HOLDMAN  Date of Exam:   12/24/2022 Medical Rec #: [id number]     Accession #:    [id number] Date of Birth: 10/19/1955      Patient Gender: M Patient Age:   70 years Exam Location:  [facility] Procedure:      VAS [place] LOWER EXTREMITY ARTERIAL DUPLEX Referring Phys: Christiane Ha BERRY --------------------------------------------------------------------------------  Indications: Peripheral artery disease, and patient denies any claudication              symptoms or rest pain. High Risk Factors: Hypertension, hyperlipidemia, Diabetes, past history of                    smoking, coronary artery disease.  Vascular Interventions: Successful diamondback orbital rotational                         atherectomy/PTA of the distal left SFA on 06/29/2015.                         Successful diamondback orbital rotational atherectomy,                         PTA of the right popliteal artery on 07/13/2015.                         Diamondback orbital rotational atherectomy, drug-coated                         balloon angioplasty, self-expanding stenting right                         popliteal artery on 04/27/2018. Current ABI:            1.12 on the right and 1.25 on the left Comparison Study: In 12/2021, a lower arterial duplex showed 30-49% stenosis in                   the right distal CFA, short segment occlusion in the right                   distal SFA. Right popliteal artery stent struts not                   visualized-no evidence of focal stenosis noted in the                   popliteal artery. On the left, there is a 50-99% stenosis in  the distal SFA, s/p atherectomy and PTA. Performing Technologist: Tyna Jaksch RVT  Examination  Guidelines: A complete evaluation includes B-mode imaging, spectral Doppler, color Doppler, and power Doppler as needed of all accessible portions of each vessel. Bilateral testing is considered an integral part of a complete examination. Limited examinations for reoccurring indications may be performed as noted.  +----------+--------+-----+---------------+---------+---------------+ RIGHT     PSV cm/sRatioStenosis       Waveform Comments        +----------+--------+-----+---------------+---------+---------------+ CFA Prox  111                         triphasic                +----------+--------+-----+---------------+---------+---------------+ CFA Mid   89                          triphasic                +----------+--------+-----+---------------+---------+---------------+ CFA Distal80                          biphasic                 +----------+--------+-----+---------------+---------+---------------+ SFA Prox  64                          biphasic                 +----------+--------+-----+---------------+---------+---------------+ SFA Mid   87                          triphasic                +----------+--------+-----+---------------+---------+---------------+ SFA Distal194     2.6  50-74% stenosistriphasicbased on VR 2.6 +----------+--------+-----+---------------+---------+---------------+ POP Prox  57                          biphasic                 +----------+--------+-----+---------------+---------+---------------+ POP Mid   30                          biphasic                 +----------+--------+-----+---------------+---------+---------------+ POP Distal17                          biphasic dampened        +----------+--------+-----+---------------+---------+---------------+ TP Trunk  39                          biphasic                 +----------+--------+-----+---------------+---------+---------------+ A focal velocity  elevation of 194 cm/s was obtained at distal SFA with post stenotic turbulence with a VR of 2.6. Findings are characteristic of 50-74% stenosis.  Right Stent(s): +-------------------------+--------+--------+--------+-------------------------+ Distal Popliteal Artery  PSV cm/sStenosisWaveformComments                  to proximal TPT                                                            +-------------------------+--------+--------+--------+-------------------------+  Prox to Stent                                    obscured by calcified                                                      plaque with shadowing     +-------------------------+--------+--------+--------+-------------------------+ Proximal Stent                                   obscured by calcified                                                      plaque with shadowing     +-------------------------+--------+--------+--------+-------------------------+ Mid Stent                30              biphasic                          +-------------------------+--------+--------+--------+-------------------------+ Distal Stent             33              biphasic                          +-------------------------+--------+--------+--------+-------------------------+ Distal to Stent          22              biphasic                          +-------------------------+--------+--------+--------+-------------------------+ The visualized portions of the popliteal artery stent is patent without evidence of stenosis.  +----------+--------+-----+---------------+---------+--------------------------+ LEFT      PSV cm/sRatioStenosis       Waveform Comments                   +----------+--------+-----+---------------+---------+--------------------------+ CFA Prox  108                         triphasic                            +----------+--------+-----+---------------+---------+--------------------------+ CFA Mid   83                          triphasic                           +----------+--------+-----+---------------+---------+--------------------------+ CFA Distal89                          triphasic                           +----------+--------+-----+---------------+---------+--------------------------+ SFA Prox  71  triphasic                           +----------+--------+-----+---------------+---------+--------------------------+ SFA Mid   153          30-49% stenosistriphasic                           +----------+--------+-----+---------------+---------+--------------------------+ SFA Distal178          30-49% stenosistriphasic1-49% stenosis, s/p                                                       atherectomy/PTA            +----------+--------+-----+---------------+---------+--------------------------+ POP Prox  34                          triphasic                           +----------+--------+-----+---------------+---------+--------------------------+ POP Mid   34                          triphasic                           +----------+--------+-----+---------------+---------+--------------------------+ POP Distal29                          triphasic                           +----------+--------+-----+---------------+---------+--------------------------+ TP Trunk  43                          triphasic                           +----------+--------+-----+---------------+---------+--------------------------+  Summary: Right: Moderate improvement is noted compared to previous study. Scattered calcified plaque with shadowing throughout. 50-74% in the distal SFA, based on VR 2.6. The visualized portions of the popliteal artery stent is patent without evidence of stenosis. Left: Minimal improvement is noted compared to previous study.  Scattered calcified plaque with shadowing throughout. 30-49% stenosis in the mid SFA. 1-49% stenosis in the distal SFA, s/p atherectomy and PTA.  See table(s) above for measurements and observations. See ABI report. Suggest follow up study in 12 months. Electronically signed by Lorine Bears MD on 12/24/2022 at 5:34:29 PM.    Final    VAS [place] ABI WITH/WO TBI  Result Date: 12/24/2022  LOWER EXTREMITY DOPPLER STUDY Patient Name:  DECOTA RUDGE  Date of Exam:   12/24/2022 Medical Rec #: [id number]     Accession #:    [id number] Date of Birth: [birth year]      Patient Gender: M Patient Age:   85 years Exam Location:  [facility] Procedure:      VAS [place] ABI WITH/WO TBI Referring Phys: Christiane Ha BERRY --------------------------------------------------------------------------------  Indications: Peripheral artery disease. Patient denies any claudication symptoms              or rest pain. High Risk Factors: Hypertension, hyperlipidemia, Diabetes, past  history of                    smoking, coronary artery disease.  Vascular Interventions: Successful diamondback orbital rotational                         atherectomy/PTA of the distal left SFA on 06/29/2015.                         Successful diamondback orbital rotational atherectomy,                         PTA of the right popliteal artery on 07/13/2015.                         Diamondback orbital rotational atherectomy, drug-coated                         balloon angioplasty, self-expanding stenting right                         popliteal artery on 04/27/2018. Comparison Study: In 12/2021, a lower arterial Doppler showed an ABI of .84 on                   the right and .99 on the left. Performing Technologist: Tyna Jaksch RVT  Examination Guidelines: A complete evaluation includes at minimum, Doppler waveform signals and systolic blood pressure reading at the level of bilateral brachial, anterior tibial, and posterior tibial arteries, when vessel segments are accessible.  Bilateral testing is considered an integral part of a complete examination. Photoelectric Plethysmograph (PPG) waveforms and toe systolic pressure readings are included as required and additional duplex testing as needed. Limited examinations for reoccurring indications may be performed as noted.  ABI Findings: +---------+------------------+-----+---------+--------+ Right    Rt Pressure (mmHg)IndexWaveform Comment  +---------+------------------+-----+---------+--------+ Brachial 115                                      +---------+------------------+-----+---------+--------+ PTA      132               1.12 triphasic         +---------+------------------+-----+---------+--------+ DP       109               0.92 triphasic         +---------+------------------+-----+---------+--------+ Great Toe91                0.77 Abnormal          +---------+------------------+-----+---------+--------+ +---------+------------------+-----+---------+-------+ Left     Lt Pressure (mmHg)IndexWaveform Comment +---------+------------------+-----+---------+-------+ Brachial 118                                     +---------+------------------+-----+---------+-------+ PTA      148               1.25 triphasic        +---------+------------------+-----+---------+-------+ DP       146               1.24 triphasic        +---------+------------------+-----+---------+-------+ Jennelle Human  0.82 Normal           +---------+------------------+-----+---------+-------+ +-------+-----------+-----------+------------+------------+ ABI/TBIToday's ABIToday's TBIPrevious ABIPrevious TBI +-------+-----------+-----------+------------+------------+ Right  1.12       .77        .84         .36          +-------+-----------+-----------+------------+------------+ Left   1.25       .82        .99         .62           +-------+-----------+-----------+------------+------------+  Right ABIs and bilateral TBIs appear increased compared to prior study on 12/25/2021. Left ABIs appear essentially unchanged compared to prior study on 12/25/2021.  Summary: Right: Resting right ankle-brachial index is within normal range. The right toe-brachial index is normal. Left: Resting left ankle-brachial index is within normal range. The left toe-brachial index is normal. *See table(s) above for measurements and observations. See LE Arterial duplex report. Suggest follow up study in 12 months. Electronically signed by Lorine Bears MD on 12/24/2022 at 5:33:06 PM.    Final     Microbiology: Results for orders placed or performed in visit on 12/31/22  Clostridium Difficile by PCR     Status: None   Collection Time: 12/31/22  9:54 AM   Specimen: Stool   Stool  Result Value Ref Range Status   Toxigenic C. Difficile by PCR Negative Negative Final    Labs: CBC: Recent Labs  Lab 12/31/22 0807 01/02/23 1843 01/03/23 0221 01/04/23 0709 01/05/23 0315 01/06/23 0243  WBC 7.2 5.0 4.5 4.4 3.7* 3.2*  NEUTROABS 4.8  --   --   --   --   --   HGB 13.9 13.4 12.5* 11.9* 9.6* 9.3*  HCT 41.3 39.8 38.2* 36.0* 28.9* 28.6*  MCV 84.5 85.8 87.4 85.1 86.0 86.4  PLT 210 212 163 199 151 153   Basic Metabolic Panel: Recent Labs  Lab 12/31/22 0807 01/02/23 1843 01/03/23 0221 01/04/23 0709 01/05/23 0315  NA 132* 129* 132* 137 138  K 4.1 4.1 3.9 5.0 3.9  CL 97* 96* 99 107 109  CO2 27 18* 21* 23 23  GLUCOSE 110* 169* 86 95 94  BUN 35* 46* 41* 26* 21  CREATININE 1.85* 2.56* 2.05* 1.57* 1.34*  CALCIUM 8.8* 8.1* 7.8* 7.9* 7.3*  MG  --  2.3  --   --   --    Liver Function Tests: Recent Labs  Lab 12/31/22 0807 01/02/23 1843  AST 58* 64*  ALT 38 46*  ALKPHOS 91 101  BILITOT 1.4* 1.4*  PROT 6.5 6.2*  ALBUMIN 3.1* 2.2*   CBG: Recent Labs  Lab 01/04/23 2003 01/05/23 0803 01/05/23 1121 01/05/23 1643 01/06/23 0800  GLUCAP 138*  97 80 94 150*    Discharge time spent: 35 minutes.  Signed: Jacquelin Hawking, MD [facility] 01/06/2023

## 2023-01-06 NOTE — Plan of Care (Signed)
Patient AOX4, VSS throughout shift. All meds given on time as ordered. Diminished lungs, IS encouraged. Pt ambulated with steady gait to bathroom. Pt had a couple Bms o/n, mostly formed brown stool without blood. POC maintained, will continue to monitor.  Problem: Education: Goal: Ability to describe self-care measures that may prevent or decrease complications (Diabetes Survival Skills Education) will improve Outcome: Progressing Goal: Individualized Educational Video(s) Outcome: Progressing   Problem: Coping: Goal: Ability to adjust to condition or change in health will improve Outcome: Progressing   Problem: Fluid Volume: Goal: Ability to maintain a balanced intake and output will improve Outcome: Progressing   Problem: Health Behavior/Discharge Planning: Goal: Ability to identify and utilize available resources and services will improve Outcome: Progressing Goal: Ability to manage health-related needs will improve Outcome: Progressing   Problem: Metabolic: Goal: Ability to maintain appropriate glucose levels will improve Outcome: Progressing   Problem: Nutritional: Goal: Maintenance of adequate nutrition will improve Outcome: Progressing Goal: Progress toward achieving an optimal weight will improve Outcome: Progressing   Problem: Skin Integrity: Goal: Risk for impaired skin integrity will decrease Outcome: Progressing   Problem: Tissue Perfusion: Goal: Adequacy of tissue perfusion will improve Outcome: Progressing   Problem: Education: Goal: Ability to identify signs and symptoms of gastrointestinal bleeding will improve Outcome: Progressing   Problem: Bowel/Gastric: Goal: Will show no signs and symptoms of gastrointestinal bleeding Outcome: Progressing   Problem: Fluid Volume: Goal: Will show no signs and symptoms of excessive bleeding Outcome: Progressing   Problem: Clinical Measurements: Goal: Complications related to the disease process, condition or  treatment will be avoided or minimized Outcome: Progressing   Problem: Education: Goal: Knowledge of General Education information will improve Description: Including pain rating scale, medication(s)/side effects and non-pharmacologic comfort measures Outcome: Progressing   Problem: Health Behavior/Discharge Planning: Goal: Ability to manage health-related needs will improve Outcome: Progressing   Problem: Clinical Measurements: Goal: Ability to maintain clinical measurements within normal limits will improve Outcome: Progressing Goal: Will remain free from infection Outcome: Progressing Goal: Diagnostic test results will improve Outcome: Progressing Goal: Respiratory complications will improve Outcome: Progressing Goal: Cardiovascular complication will be avoided Outcome: Progressing   Problem: Activity: Goal: Risk for activity intolerance will decrease Outcome: Progressing   Problem: Nutrition: Goal: Adequate nutrition will be maintained Outcome: Progressing   Problem: Coping: Goal: Level of anxiety will decrease Outcome: Progressing   Problem: Elimination: Goal: Will not experience complications related to bowel motility Outcome: Progressing Goal: Will not experience complications related to urinary retention Outcome: Progressing   Problem: Pain Managment: Goal: General experience of comfort will improve Outcome: Progressing   Problem: Safety: Goal: Ability to remain free from injury will improve Outcome: Progressing   Problem: Skin Integrity: Goal: Risk for impaired skin integrity will decrease Outcome: Progressing

## 2023-01-06 NOTE — Discharge Instructions (Signed)
Matthew Chen,  You were in the hospital with dehydration from diarrhea and resultant kidney impairment. You improved with IV fluids. You had a colonoscopy for your diarrhea to ensure there was no concern for a UC flare. You had some bleeding from your rectum but thankfully that is improved. Please keep well hydrated with your diarrhea and take your antidiarrhea medications as prescribed. Please follow-up with your PCP for a hospital follow-up visit and the GI physician for biopsy results. Please resume your Eliquis in two days.

## 2023-01-06 NOTE — Progress Notes (Signed)
Explained discharge instructions to patient. Reviewed follow up appointment and next medication administration times. Also reviewed education. Patient verbalized having an understanding for instructions given. All belongings are in the patient's possession. IV was removed. No other needs verbalized. Transported downstairs for discharge. 

## 2023-01-06 NOTE — Plan of Care (Signed)
  Problem: Education: Goal: Ability to describe self-care measures that may prevent or decrease complications (Diabetes Survival Skills Education) will improve Outcome: Adequate for Discharge Goal: Individualized Educational Video(s) Outcome: Adequate for Discharge   Problem: Coping: Goal: Ability to adjust to condition or change in health will improve Outcome: Adequate for Discharge   Problem: Fluid Volume: Goal: Ability to maintain a balanced intake and output will improve Outcome: Adequate for Discharge   Problem: Health Behavior/Discharge Planning: Goal: Ability to identify and utilize available resources and services will improve Outcome: Adequate for Discharge Goal: Ability to manage health-related needs will improve Outcome: Adequate for Discharge   Problem: Metabolic: Goal: Ability to maintain appropriate glucose levels will improve Outcome: Adequate for Discharge   Problem: Nutritional: Goal: Maintenance of adequate nutrition will improve Outcome: Adequate for Discharge Goal: Progress toward achieving an optimal weight will improve Outcome: Adequate for Discharge   Problem: Skin Integrity: Goal: Risk for impaired skin integrity will decrease Outcome: Adequate for Discharge   Problem: Tissue Perfusion: Goal: Adequacy of tissue perfusion will improve Outcome: Adequate for Discharge   Problem: Education: Goal: Ability to identify signs and symptoms of gastrointestinal bleeding will improve Outcome: Adequate for Discharge   Problem: Bowel/Gastric: Goal: Will show no signs and symptoms of gastrointestinal bleeding Outcome: Adequate for Discharge   Problem: Fluid Volume: Goal: Will show no signs and symptoms of excessive bleeding Outcome: Adequate for Discharge   Problem: Clinical Measurements: Goal: Complications related to the disease process, condition or treatment will be avoided or minimized Outcome: Adequate for Discharge   Problem: Education: Goal:  Knowledge of General Education information will improve Description: Including pain rating scale, medication(s)/side effects and non-pharmacologic comfort measures Outcome: Adequate for Discharge   Problem: Health Behavior/Discharge Planning: Goal: Ability to manage health-related needs will improve Outcome: Adequate for Discharge   Problem: Clinical Measurements: Goal: Ability to maintain clinical measurements within normal limits will improve Outcome: Adequate for Discharge Goal: Will remain free from infection Outcome: Adequate for Discharge Goal: Diagnostic test results will improve Outcome: Adequate for Discharge Goal: Respiratory complications will improve Outcome: Adequate for Discharge Goal: Cardiovascular complication will be avoided Outcome: Adequate for Discharge   Problem: Activity: Goal: Risk for activity intolerance will decrease Outcome: Adequate for Discharge   Problem: Nutrition: Goal: Adequate nutrition will be maintained Outcome: Adequate for Discharge   Problem: Coping: Goal: Level of anxiety will decrease Outcome: Adequate for Discharge   Problem: Elimination: Goal: Will not experience complications related to bowel motility Outcome: Adequate for Discharge Goal: Will not experience complications related to urinary retention Outcome: Adequate for Discharge   Problem: Pain Managment: Goal: General experience of comfort will improve Outcome: Adequate for Discharge   Problem: Safety: Goal: Ability to remain free from injury will improve Outcome: Adequate for Discharge   Problem: Skin Integrity: Goal: Risk for impaired skin integrity will decrease Outcome: Adequate for Discharge

## 2023-01-06 NOTE — Progress Notes (Signed)
IP PROGRESS NOTE  Subjective:   Mr. Ursin is well-known to me with a history of hepatocellular carcinoma.  He was seen in the office 12/31/2022.  A restaging MRI of the abdomen on 12/27/2022 revealed stable disease.  He reported diarrhea and cabozantinib was placed on hold. He presented to the emergency room on 01/02/2023 with generalized weakness and persistent diarrhea.  He was admitted for further evaluation.  Dr. Adela Lank was consulted.  A sigmoidoscopy on 01/03/2023 revealed a single ulcer at the dentate line and an anal fissure.  The sigmoid colon appeared normal. An enteric pathogen panel and C. difficile toxin were negative.  Mr. Margo Aye reports improvement in diarrhea.  He reports 2 diarrhea stools yesterday.  He had rectal bleeding on 01/04/2023.     Objective: Vital signs in last 24 hours: Blood pressure 133/72, pulse 68, temperature 97.8 F (36.6 C), temperature source Oral, resp. rate 15, height 5\' 10"  (1.778 m), weight 196 lb 6.9 oz (89.1 kg), SpO2 98 %.  Intake/Output from previous day: 05/26 0701 - 05/27 0700 In: 1090 [P.O.:1090] Out: -   Physical Exam:  HEENT: No thrush Lungs: Clear bilaterally Cardiac: Regular rate and rhythm Abdomen: Soft and nontender, no hepatosplenomegaly, no mass Extremities: No leg edema   Portacath/PICC-without erythema  Lab Results: Recent Labs    01/05/23 0315 01/06/23 0243  WBC 3.7* 3.2*  HGB 9.6* 9.3*  HCT 28.9* 28.6*  PLT 151 153    BMET Recent Labs    01/04/23 0709 01/05/23 0315  NA 137 138  K 5.0 3.9  CL 107 109  CO2 23 23  GLUCOSE 95 94  BUN 26* 21  CREATININE 1.57* 1.34*  CALCIUM 7.9* 7.3*    Lab Results  Component Value Date   CEA1 1.9 06/21/2021    Studies/Results: No results found.  Medications: I have reviewed the patient's current medications.  Assessment/Plan:  Hepatocellular carcinoma CT angio chest aorta 01/30/2021-no evidence of thoracic aortic aneurysm, diffuse hepatic steatosis, suggestion  of early cirrhosis, 7.1 cm enhancing masslike area in segment 5/6, prominent gastrohepatic and periportal nodes measuring up to 7 mm MRI liver 03/22/2021-arterial hyperenhancing subcapsular mass, segment 6, 7 x 5.1 cm, LI-RADS 5, no pathologically large lymph nodes Ultrasound-guided biopsy 04/24/2021-hepatocellular carcinoma Y90 radioembolization of segment 6 and segment 7 hepatic arterial branches 06/21/2021 MRI abdomen 09/03/2021-new increased enhancement inferior and superior to the dominant right liver lesion, new 11 mm enhancing lesion in the left liver LIRADS 3, dominant right hepatic lesion stable in size MRI abdomen 12/19/2021-unchanged hyperenhancing mass in the anterior inferior right liver measuring 7.8 x 5 cm, interval enlargement of a arterial enhancing lesion in segment 2- LIRADS 5, new small enhancing lesions hepatic segment 4A measuring 1.2 x 0.9 cm and 0.8 x 0.6 cm- LIRADS 5 Cycle 1 atezolizumab /bevacizumab 02/07/2022 Cycle 2 atezolizumab/bevacizumab 02/27/2022 Cycle 3 atezolizumab/bevacizumab 03/21/2022 Cycle 4 atezolizumab/bevacizumab 04/22/2022 MRI abdomen 05/05/2022-no change in dominant segment 6 lesion, no change in hyperenhancing lesions in segment 4A and segment 2, no new lesions Cycle 5 atezolizumab/bevacizumab 05/13/2022 Cycle 6 atezolizumab/bevacizumab 06/04/2022 Cycle 7 atezolizumab/bevacizumab 06/24/2022 Cycle 8 atezolizumab/bevacizumab 07/15/2022 MRI abdomen 07/20/2022-mild progression of bilateral liver lesions, no evidence of extrahepatic metastatic disease MRI abdomen [facility] 08/19/2022-increased size of the dominant admitted 5/6 lesion, now measuring 12.7 x 8.8 cm, multiple additional hepatic lesions appear larger than on the previous exam, stable mildly enlarged upper abdominal lymph nodes Lenvatinib 09/09/2022, stopped 10/04/2022 Cabozantinib 10/11/2022 MRI abdomen 12/27/2022-technical limitation on the 07/20/2022 exam makes direct comparison difficult, notes  significant change in  the enhancing liver lesions Cabozantinib placed on hold 12/31/2022 Ulcerative colitis-maintained on vedolizumab Peripheral arterial disease History of a thoracic aortic aneurysm CAD Diabetes Hypertension Chronic renal failure Paroxysmal atrial fibrillation Admission 01/02/2023 with increased diarrhea-sigmoidoscopy with a single ulcer at the dentate line and an anal fissure, sigmoid colon unremarkable.  Negative enteric pathogen panel   Mr. Tuazon has multifocal hepatocellular carcinoma.  He developed progressive disease following  Y 90 and atezolizumab/bevacizumab.  He is currently being treated with cabozantinib.  An MRI liver on 12/27/2022 revealed no significant change in measurable disease.  He has developed diarrhea over the past several weeks.  The diarrhea is likely secondary to cabozantinib.  Cabozantinib was placed on hold 12/31/2022.  The diarrhea does not appear related to inflammatory bowel disease.  The diarrhea appears improved over the past few days.  The creatinine has improved.  I discussed treatment options and the prognosis with Mr. Bernhard.  He understands no therapy would be curative.  His predicted lifespan is estimated to be months to a year with regard to the hepatocellular carcinoma.  He has comorbid conditions which may limit his lifespan.  We will consider switching to a different systemic therapy agent such as lenvatinib when he returns for outpatient follow-up.  Recommendations: Antidiarrhea medication as recommended by Dr. Adela Lank Treatment of anal fissure and rectal ulcer per Dr. Adela Lank Continue to hold cabozantinib Outpatient follow-up is scheduled at the Cancer center 12/30/2022, this will be rescheduled until next week    LOS: 3 days   Thornton Papas, MD   01/06/2023, 7:19 AM

## 2023-01-06 NOTE — Progress Notes (Signed)
      Progress Note   Subjective  Patient reports he is stable. Diarrhea improved from admission but still having some urgency and episode of incontinence overnight. Rectal bleeding has stopped. Hgb stable.   Objective   Vital signs in last 24 hours: Temp:  [97.6 F (36.4 C)-97.9 F (36.6 C)] 97.7 F (36.5 C) (05/27 0800) Pulse Rate:  [58-80] 80 (05/27 0800) Resp:  [14-16] 16 (05/27 0800) BP: (104-133)/(72-80) 104/80 (05/27 0800) SpO2:  [97 %-99 %] 97 % (05/27 0800) Last BM Date : 01/06/23 (0800) General:    white male in NAD Neurologic:  Alert and oriented,  grossly normal neurologically. Psych:  Cooperative. Normal mood and affect.  Intake/Output from previous day: 05/26 0701 - 05/27 0700 In: 1090 [P.O.:1090] Out: -  Intake/Output this shift: No intake/output data recorded.  Lab Results: Recent Labs    01/04/23 0709 01/05/23 0315 01/06/23 0243  WBC 4.4 3.7* 3.2*  HGB 11.9* 9.6* 9.3*  HCT 36.0* 28.9* 28.6*  PLT 199 151 153   BMET Recent Labs    01/04/23 0709 01/05/23 0315  NA 137 138  K 5.0 3.9  CL 107 109  CO2 23 23  GLUCOSE 95 94  BUN 26* 21  CREATININE 1.57* 1.34*  CALCIUM 7.9* 7.3*   LFT No results for input(s): "PROT", "ALBUMIN", "AST", "ALT", "ALKPHOS", "BILITOT", "BILIDIR", "IBILI" in the last 72 hours. PT/INR No results for input(s): "LABPROT", "INR" in the last 72 hours.  Studies/Results: No results found.     Assessment / Plan:    67 y/o male here with the following:   Diarrhea - likely secondary to cabozantinib  Rectal pain / bleeding - likely secondary to anal fissure / rectal ulceration UC - on Entyvio HCC on cabozantinib    Flex sig showed no active UC. Biopsies taken. On lomotil and colestid and his diarrhea is improved but not resolved. His rectal pain / bleeding is improved with diltiazem / lidocaine ointment applied PR (we got this at the [place] [facility] compounded and his wife brought it to the hospital).   His AKI  has resolved, he can function on his current regimen. I think okay to go home today. Can increase lomotil as needed, would increase to QID and additional dosing as needed. Can continue colestid 1gm BID with option to increase to 2gm BID. Continue diltiazem / lidocaine PR.   Cabozantinib on hold. Should have biopsies back from his flex sig later this week.   He can follow up with Dr. Russella Dar as an outpatient.  PLAN: - okay for discharge home - lomotil QID with additional dosing PRN - colestid 1gm BID - can increase to 2gm BID if needed - continue Entyvio as outpatient - await pathology results - holding cabozantinib per oncology - we will coordinate outpatient follow up with Dr. Russella Dar  Call with questions, we will sign off for now.  Harlin Rain, MD [facility]

## 2023-01-07 ENCOUNTER — Inpatient Hospital Stay: Payer: PPO | Admitting: Oncology

## 2023-01-07 ENCOUNTER — Other Ambulatory Visit: Payer: Self-pay | Admitting: *Deleted

## 2023-01-07 DIAGNOSIS — K5190 Ulcerative colitis, unspecified, without complications: Secondary | ICD-10-CM | POA: Diagnosis not present

## 2023-01-07 DIAGNOSIS — C220 Liver cell carcinoma: Secondary | ICD-10-CM

## 2023-01-07 LAB — SURGICAL PATHOLOGY

## 2023-01-07 LAB — SERIAL MONITORING

## 2023-01-07 LAB — VEDOLIZUMAB AND ANTI-VEDO AB: Anti-Vedolizumab Antibody: <25 ng/mL

## 2023-01-07 NOTE — Progress Notes (Signed)
Per Dr. Truett Perna: Cancel f/u 5/28 and schedule lab/OV week of 6/3. Scheduling message sent.

## 2023-01-08 ENCOUNTER — Telehealth: Payer: Self-pay

## 2023-01-08 LAB — CALPROTECTIN, FECAL: Calprotectin, Fecal: 504 ug/g — ABNORMAL HIGH (ref 0–120)

## 2023-01-08 NOTE — Transitions of Care (Post Inpatient/ED Visit) (Signed)
01/08/2023  Name: Matthew Chen MRN: [id number] DOB: 1956-01-24  Today's TOC FU Call Status: Today's TOC FU Call Status:: Successful TOC FU Call Competed TOC FU Call Complete Date: 01/08/23  Transition Care Management Follow-up Telephone Call Date of Discharge: 01/06/23 Discharge Facility: [facility]) Type of Discharge: Inpatient Admission Primary Inpatient Discharge Diagnosis:: Acute Kidney Injury How have you been since you were released from the hospital?: Better (Patient noted he has has 2 episodes of diarrhea yesterday.  He took Lomotil and has not had any episodes today.) Any questions or concerns?: No  Items Reviewed: Did you receive and understand the discharge instructions provided?: Yes Medications obtained,verified, and reconciled?: Yes (Medications Reviewed) Any new allergies since your discharge?: No Dietary orders reviewed?: Yes Type of Diet Ordered:: Carb Modified Do you have support at home?: Yes People in Home: spouse Name of Support/Comfort Primary Source: [name]  Medications Reviewed Today: Medications Reviewed Today     Reviewed by Jodelle Gross, RN (Case Manager) on 01/08/23 at 1327  Med List Status: <None>   Medication Order Taking? Sig Documenting Provider Last Dose Status Informant  acetaminophen (TYLENOL) 500 MG tablet [id number] No Take 1 tablet (500 mg total) by mouth every 6 (six) hours as needed.  Patient taking differently: Take 500 mg by mouth every 6 (six) hours as needed for moderate pain.   Derwood Kaplan, MD Unknown Active Self  AMBULATORY Clent [place] MEDICATION [id number] Yes Diltiazem 2%/Lidocaine 2% apply a pea sized amount PR TID Armbruster, Willaim Rayas, MD Taking Active   amiodarone (PACERONE) 200 MG tablet [id number] Yes Take 1 tablet (200 mg total) by mouth daily. Graciella Freer, [name] Taking Active Self  apixaban (ELIQUIS) 5 MG TABS tablet [id number] Yes Take 1 tablet (5 mg total) by mouth 2 (two) times daily. Narda Bonds, MD  Taking Active   aspirin EC 81 MG tablet [id number] Yes Take 1 tablet (81 mg total) by mouth daily. Chilton Si, MD Taking Active Self  atorvastatin (LIPITOR) 80 MG tablet [id number] Yes Take 1 tablet (80 mg total) by mouth daily. Joylene Grapes, NP Taking Active Self  carvedilol (COREG) 12.5 MG tablet [id number] Yes Take 1 tablet (12.5 mg total) by mouth 2 (two) times daily with a meal. Donita Brooks, MD Taking Active Self  colestipol (COLESTID) 1 g tablet [id number] Yes Take 1 tablet (1 g total) by mouth 2 (two) times daily. Narda Bonds, MD Taking Active   cyanocobalamin (VITAMIN B12) 1000 MCG tablet [id number] No Take 1,000 mcg by mouth daily. [provider] Unknown Active Self  diltiazem (CARDIZEM) 30 MG tablet [id number] No Take 1 tablet (30 mg total) by mouth 3 (three) times daily as needed (palpitations).  Patient not taking: Reported on 01/02/2023   Joylene Grapes, NP Not Taking Active Self  diphenoxylate-atropine (LOMOTIL) 2.5-0.025 MG tablet [id number] Yes Take 1-2 tablets by mouth 4 (four) times daily. Narda Bonds, MD Taking Active   empagliflozin (JARDIANCE) 10 MG TABS tablet [id number] Yes Take 1 tablet (10 mg total) by mouth daily. Joylene Grapes, NP Taking Active Self  fenofibrate 160 MG tablet [id number] No Take 1 tablet (160 mg total) by mouth daily. Donita Brooks, MD Unknown Active Self  glucose blood (ONETOUCH ULTRA) test strip [id number] Yes CHECK BLOOD SUGAR TWICE A DAY Donita Brooks, MD Taking Active Self  Insulin Pen Needle (B-D UF III MINI PEN NEEDLES) 31G X 5 MM MISC [id number] Yes Use daily with Victoza Donita Brooks,  MD Taking Active Self  Lidocaine-Menthol, Spray, 4-1 % LIQD [id number] No Apply 1 spray topically as needed. Donita Brooks, MD Unknown Active   lisinopril (ZESTRIL) 20 MG tablet [id number] Yes Take 1 tablet (20 mg total) by mouth daily. Joylene Grapes, NP Taking Active Self  magic mouthwash w/lidocaine SOLN [id number] No Take 5 mLs  by mouth every 6 (six) hours as needed for mouth pain. Doree Albee, [name] Unknown Active Self  mesalamine (CANASA) 1000 MG suppository [id number] No Place 1 suppository (1,000 mg total) rectally at bedtime.  Patient not taking: Reported on 01/02/2023   Doree Albee, [name] Not Taking Active Self  Omega-3 Fatty Acids (FISH OIL) 1000 MG CAPS [id number]  Take 1,000 mg by mouth 2 (two) times daily. [provider]  Active Self  oxyCODONE (ROXICODONE) 5 MG immediate release tablet [id number]  Take 1 tablet (5 mg total) by mouth every 4 (four) hours as needed for severe pain. Donita Brooks, MD  Active   pantoprazole (PROTONIX) 40 MG tablet [id number]  Take 1 tablet (40 mg total) by mouth 2 (two) times daily. Donita Brooks, MD  Active Self  pioglitazone (ACTOS) 30 MG tablet [id number]  TAKE 1 TABLET BY MOUTH EVERY DAY. STRENGTH: 30 MG  Patient taking differently: Take 30 mg by mouth daily.   Donita Brooks, MD  Active Self  sildenafil (VIAGRA) 100 MG tablet [id number]  Take 0.5-1 tablets (50-100 mg total) by mouth daily as needed for erectile dysfunction. Donita Brooks, MD  Active Self  sitaGLIPtin (JANUVIA) 100 MG tablet [id number]  Take 1 tablet (100 mg total) by mouth daily. Park Meo, FNP  Active Self  vedolizumab (ENTYVIO) 300 MG injection [id number]  Inject 300 mg as directed See admin instructions. Every 2 months [provider]  Active Self           Med Note (WHITE, Karlene Lineman Jan 02, 2023 11:57 PM) Next infusion 01/07/23            Home Care and Equipment/Supplies: Were Home Health Services Ordered?: No Any new equipment or medical supplies ordered?: No  Functional Questionnaire: Do you need assistance with bathing/showering or dressing?: No Do you need assistance with meal preparation?: No Do you need assistance with eating?: No Do you have difficulty maintaining continence: No Do you need assistance with getting out of bed/getting out of a  chair/moving?: No Do you have difficulty managing or taking your medications?: No  Follow up appointments reviewed: PCP Follow-up appointment confirmed?: Yes Date of PCP follow-up appointment?: 01/20/23 Follow-up Provider: Dr. Tanya Nones Specialist [facility] Follow-up appointment confirmed?: NA Do you need transportation to your follow-up appointment?: No Do you understand care options if your condition(s) worsen?: Yes-patient verbalized understanding  SDOH Interventions Today    Flowsheet Row Most Recent Value  SDOH Interventions   Food Insecurity Interventions Intervention Not Indicated  Housing Interventions Intervention Not Indicated  Transportation Interventions Intervention Not Indicated  Utilities Interventions Intervention Not Indicated  Financial Strain Interventions Intervention Not Indicated      Interventions Today    Flowsheet Row Most Recent Value  Chronic Disease   Chronic disease during today's visit Hypertension (HTN), Atrial Fibrillation (AFib)  General Interventions   General Interventions Discussed/Reviewed General Interventions Discussed, General Interventions Reviewed  Nutrition Interventions   Nutrition Discussed/Reviewed Nutrition Discussed       TOC Interventions Today    Flowsheet Row Most Recent Value  TOC Interventions   TOC Interventions  Discussed/Reviewed TOC Interventions Discussed, TOC Interventions Reviewed, Post discharge activity limitations per provider       Jodelle Gross, RN, BSN, CCM Care Management Coordinator [facility]

## 2023-01-11 ENCOUNTER — Encounter (HOSPITAL_COMMUNITY): Payer: Self-pay

## 2023-01-11 ENCOUNTER — Other Ambulatory Visit: Payer: Self-pay

## 2023-01-11 ENCOUNTER — Telehealth: Payer: Self-pay | Admitting: Nurse Practitioner

## 2023-01-11 ENCOUNTER — Emergency Department (HOSPITAL_COMMUNITY)
Admission: EM | Admit: 2023-01-11 | Discharge: 2023-01-11 | Disposition: A | Discharge status: Home / Self Care | Payer: PPO | Attending: Emergency Medicine | Admitting: Emergency Medicine

## 2023-01-11 DIAGNOSIS — Z7982 Long term (current) use of aspirin: Secondary | ICD-10-CM | POA: Insufficient documentation

## 2023-01-11 DIAGNOSIS — D649 Anemia, unspecified: Secondary | ICD-10-CM | POA: Insufficient documentation

## 2023-01-11 DIAGNOSIS — C220 Liver cell carcinoma: Secondary | ICD-10-CM | POA: Diagnosis not present

## 2023-01-11 DIAGNOSIS — Z7901 Long term (current) use of anticoagulants: Secondary | ICD-10-CM | POA: Diagnosis not present

## 2023-01-11 DIAGNOSIS — R197 Diarrhea, unspecified: Secondary | ICD-10-CM | POA: Insufficient documentation

## 2023-01-11 LAB — COMPREHENSIVE METABOLIC PANEL
ALT: 30 U/L (ref 0–44)
AST: 44 U/L — ABNORMAL HIGH (ref 15–41)
Albumin: 2 g/dL — ABNORMAL LOW (ref 3.5–5.0)
Alkaline Phosphatase: 73 U/L (ref 38–126)
Anion gap: 7 (ref 5–15)
BUN: 28 mg/dL — ABNORMAL HIGH (ref 8–23)
CO2: 24 mmol/L (ref 22–32)
Calcium: 7.9 mg/dL — ABNORMAL LOW (ref 8.9–10.3)
Chloride: 105 mmol/L (ref 98–111)
Creatinine, Ser: 1.61 mg/dL — ABNORMAL HIGH (ref 0.61–1.24)
GFR, Estimated: 47 mL/min — ABNORMAL LOW (ref >60)
Glucose, Bld: 154 mg/dL — ABNORMAL HIGH (ref 70–99)
Potassium: 4.1 mmol/L (ref 3.5–5.1)
Sodium: 136 mmol/L (ref 135–145)
Total Bilirubin: 1.2 mg/dL (ref 0.3–1.2)
Total Protein: 5.5 g/dL — ABNORMAL LOW (ref 6.5–8.1)

## 2023-01-11 LAB — LIPASE, BLOOD: Lipase: 25 U/L (ref 11–51)

## 2023-01-11 LAB — CBC
HCT: 29.2 % — ABNORMAL LOW (ref 39.0–52.0)
Hemoglobin: 9.5 g/dL — ABNORMAL LOW (ref 13.0–17.0)
MCH: 30.5 pg (ref 26.0–34.0)
MCHC: 32.5 g/dL (ref 30.0–36.0)
MCV: 93.9 fL (ref 80.0–100.0)
Platelets: 246 10*3/uL (ref 150–400)
RBC: 3.11 MIL/uL — ABNORMAL LOW (ref 4.22–5.81)
RDW: 27.2 % — ABNORMAL HIGH (ref 11.5–15.5)
WBC: 4.9 10*3/uL (ref 4.0–10.5)
nRBC: 0 % (ref 0.0–0.2)

## 2023-01-11 LAB — URINALYSIS, ROUTINE W REFLEX MICROSCOPIC
Bilirubin Urine: NEGATIVE
Glucose, UA: >=500 mg/dL — AB
Hgb urine dipstick: NEGATIVE
Ketones, ur: NEGATIVE mg/dL
Leukocytes,Ua: NEGATIVE
Nitrite: NEGATIVE
Protein, ur: NEGATIVE mg/dL
Specific Gravity, Urine: 1.015 (ref 1.005–1.030)
pH: 5 (ref 5.0–8.0)

## 2023-01-11 MED ORDER — LACTATED RINGERS IV BOLUS
2000.0000 mL | Freq: Once | INTRAVENOUS | Status: AC
  Administered 2023-01-11: 2000 mL via INTRAVENOUS

## 2023-01-11 NOTE — Discharge Instructions (Signed)
Your workup today is overall reassuring.  You received IV fluids in the emergency department.  Take Lomotil as discussed.  You can take 2 tablets 4 times a day.  Do not exceed more than 8 tablets within 24-hour period.

## 2023-01-11 NOTE — Telephone Encounter (Signed)
67 year old male with history of CKD stage III, diabetes mellitus type 2, hypertension, hepatocellular carcinoma on chemotherapy, ulcerative colitis, atrial fibrillation on Eliquis, C. difficile infection. Last Entyvio infusion was 01/07/2023.  Patient called the answering service due to having persistent diarrhea.  He endorsed passing a episodes of diarrhea yesterday evening into this morning.  No bloody diarrhea.  No abdominal pain.  He was admitted to the hospital 5/23 - 01/06/2023 with generalized weakness, diarrhea and AKI. flexible sigmoidoscopy on 5/24 which was significant for anal fissure solitary ulcer in the dentate line/anal canal with biopsy obtained.  Flex sig: anal fissure found on perianal exam. - A single (solitary) ulcer in the dentate line / anal canal. - The rectum, sigmoid colon and recto-sigmoid colon are normal. Biopsied. Rectal ulcer / fissure causing pain. His colitis appears well controlled on Entyvio but will await biopsies. I suspect his diarrhea is from the Carbometyx and would hold off on steroids at this time.  A. RECTUM, BIOPSY:       Benign colonic mucosa with hyperplastic and prolapse changes.       Negative for activity, chronicity, granuloma, dysplasia or  malignancy.    He is taking Lomotil 3-4 times daily Colestid 1 tab twice daily without improvement.  His urine is getting darker in color.  No chest pain, shortness of breath or dizziness.  I advised the patient to go to the ED for labs and IV hydration and further diarrhea medication/management to be determined after ED evaluation.

## 2023-01-11 NOTE — ED Provider Notes (Signed)
[place] EMERGENCY DEPARTMENT AT [facility] Provider Note   CSN: [id number] Arrival date & time: 01/11/23  1318     History  Chief Complaint  Patient presents with   Diarrhea    Matthew Chen is a 67 y.o. male.  67 year old male with past medical history significant for hepatic cell carcinoma recently on chemo but stopped prior to recent admission presents today for concern of persistent diarrhea.  This has been ongoing for 3 weeks.  He had recent admission secondary to this due to an AKI.  He states since leaving the hospital this has not improved.  Currently denies orthostasis.  He states since yesterday afternoon to this morning he had 8 episodes.  Denies any abdominal pain.  Denies any blood in his stools.  He does have history of ulcerative colitis but states that it has been under control.  The history is provided by the patient. No language interpreter was used.       Home Medications Prior to Admission medications   Medication Sig Start Date End Date Taking? Authorizing Provider  acetaminophen (TYLENOL) 500 MG tablet Take 1 tablet (500 mg total) by mouth every 6 (six) hours as needed. Patient taking differently: Take 500 mg by mouth every 6 (six) hours as needed for moderate pain. 03/27/22   Derwood Kaplan, MD  AMBULATORY NON FORMULARY MEDICATION Diltiazem 2%/Lidocaine 2% apply a pea sized amount PR TID 01/03/23   Armbruster, Willaim Rayas, MD  amiodarone (PACERONE) 200 MG tablet Take 1 tablet (200 mg total) by mouth daily. 12/24/22   Graciella Freer, [name]  apixaban (ELIQUIS) 5 MG TABS tablet Take 1 tablet (5 mg total) by mouth 2 (two) times daily. 01/08/23   Narda Bonds, MD  aspirin EC 81 MG tablet Take 1 tablet (81 mg total) by mouth daily. 06/01/15   Chilton Si, MD  atorvastatin (LIPITOR) 80 MG tablet Take 1 tablet (80 mg total) by mouth daily. 10/30/22   Joylene Grapes, NP  carvedilol (COREG) 12.5 MG tablet Take 1 tablet (12.5 mg total) by mouth 2  (two) times daily with a meal. 12/18/22   Donita Brooks, MD  colestipol (COLESTID) 1 g tablet Take 1 tablet (1 g total) by mouth 2 (two) times daily. 01/06/23 02/05/23  Narda Bonds, MD  cyanocobalamin (VITAMIN B12) 1000 MCG tablet Take 1,000 mcg by mouth daily.    [provider]  diltiazem (CARDIZEM) 30 MG tablet Take 1 tablet (30 mg total) by mouth 3 (three) times daily as needed (palpitations). Patient not taking: Reported on 01/02/2023 10/30/22 10/30/23  Joylene Grapes, NP  diphenoxylate-atropine (LOMOTIL) 2.5-0.025 MG tablet Take 1-2 tablets by mouth 4 (four) times daily. 01/06/23   Narda Bonds, MD  empagliflozin (JARDIANCE) 10 MG TABS tablet Take 1 tablet (10 mg total) by mouth daily. 10/30/22   Joylene Grapes, NP  fenofibrate 160 MG tablet Take 1 tablet (160 mg total) by mouth daily. 03/22/22   Donita Brooks, MD  glucose blood (ONETOUCH ULTRA) test strip CHECK BLOOD SUGAR TWICE A DAY 03/19/21   Donita Brooks, MD  Insulin Pen Needle (B-D UF III MINI PEN NEEDLES) 31G X 5 MM MISC Use daily with Victoza 05/31/22   Donita Brooks, MD  Lidocaine-Menthol, Spray, 4-1 % LIQD Apply 1 spray topically as needed. 01/03/23   Donita Brooks, MD  lisinopril (ZESTRIL) 20 MG tablet Take 1 tablet (20 mg total) by mouth daily. 10/30/22   Monge,  Petra Kuba, NP  magic mouthwash w/lidocaine SOLN Take 5 mLs by mouth every 6 (six) hours as needed for mouth pain. 12/31/22   Doree Albee, [name]  mesalamine (CANASA) 1000 MG suppository Place 1 suppository (1,000 mg total) rectally at bedtime. Patient not taking: Reported on 01/02/2023 12/31/22   Doree Albee, [name]  Omega-3 Fatty Acids (FISH OIL) 1000 MG CAPS Take 1,000 mg by mouth 2 (two) times daily.    [provider]  oxyCODONE (ROXICODONE) 5 MG immediate release tablet Take 1 tablet (5 mg total) by mouth every 4 (four) hours as needed for severe pain. 01/03/23   Donita Brooks, MD  pantoprazole (PROTONIX) 40 MG tablet Take 1 tablet  (40 mg total) by mouth 2 (two) times daily. 03/22/22   Donita Brooks, MD  pioglitazone (ACTOS) 30 MG tablet TAKE 1 TABLET BY MOUTH EVERY DAY. STRENGTH: 30 MG Patient taking differently: Take 30 mg by mouth daily. 08/26/22   Donita Brooks, MD  sildenafil (VIAGRA) 100 MG tablet Take 0.5-1 tablets (50-100 mg total) by mouth daily as needed for erectile dysfunction. 05/30/22   Donita Brooks, MD  sitaGLIPtin (JANUVIA) 100 MG tablet Take 1 tablet (100 mg total) by mouth daily. 11/05/22   Park Meo, FNP  vedolizumab (ENTYVIO) 300 MG injection Inject 300 mg as directed See admin instructions. Every 2 months    [provider]      Allergies    Penicillins    Review of Systems   Review of Systems  Constitutional:  Negative for chills and fever.  Gastrointestinal:  Positive for diarrhea. Negative for abdominal pain, blood in stool, nausea and vomiting.  Genitourinary:  Negative for dysuria.  Neurological:  Negative for light-headedness.  All other systems reviewed and are negative.   Physical Exam Updated Vital Signs BP (!) 96/52 (BP Location: Right Arm)   Pulse 73   Temp 97.7 F (36.5 C)   Resp 17   Ht 5\' 10"  (1.778 m)   Wt 89.4 kg   SpO2 (!) 89%   BMI 28.27 kg/m  Physical Exam Vitals and nursing note reviewed.  Constitutional:      General: He is not in acute distress.    Appearance: Normal appearance. He is not ill-appearing.  HENT:     Head: Normocephalic and atraumatic.     Nose: Nose normal.  Eyes:     General: No scleral icterus.    Extraocular Movements: Extraocular movements intact.     Conjunctiva/sclera: Conjunctivae normal.  Cardiovascular:     Rate and Rhythm: Normal rate and regular rhythm.     Pulses: Normal pulses.     Heart sounds: Normal heart sounds.  Pulmonary:     Effort: Pulmonary effort is normal. No respiratory distress.     Breath sounds: Normal breath sounds. No wheezing or rales.  Abdominal:     General: There is no  distension.     Palpations: Abdomen is soft.     Tenderness: There is no abdominal tenderness. There is no guarding or rebound.  Musculoskeletal:        General: Normal range of motion.     Cervical back: Normal range of motion.  Skin:    General: Skin is warm and dry.  Neurological:     General: No focal deficit present.     Mental Status: He is alert. Mental status is at baseline.     ED Results / Procedures / Treatments   Labs (all  labs ordered are listed, but only abnormal results are displayed) Labs Reviewed  COMPREHENSIVE METABOLIC PANEL - Abnormal; Notable for the following components:      Result Value   Glucose, Bld 154 (*)    BUN 28 (*)    Creatinine, Ser 1.61 (*)    Calcium 7.9 (*)    Total Protein 5.5 (*)    Albumin 2.0 (*)    AST 44 (*)    GFR, Estimated 47 (*)    All other components within normal limits  CBC - Abnormal; Notable for the following components:   RBC 3.11 (*)    Hemoglobin 9.5 (*)    HCT 29.2 (*)    RDW 27.2 (*)    All other components within normal limits  URINALYSIS, ROUTINE W REFLEX MICROSCOPIC - Abnormal; Notable for the following components:   Glucose, UA >=500 (*)    Bacteria, UA RARE (*)    All other components within normal limits  GASTROINTESTINAL PANEL BY PCR, STOOL (REPLACES STOOL CULTURE)  C DIFFICILE QUICK SCREEN W PCR REFLEX    LIPASE, BLOOD    EKG None  Radiology No results found.  Procedures Procedures    Medications Ordered in ED Medications  lactated ringers bolus 2,000 mL (has no administration in time range)    ED Course/ Medical Decision Making/ A&P                             Medical Decision Making Amount and/or Complexity of Data Reviewed Labs: ordered.   Six 23-year-old male presents today for concern of persistent diarrhea.  He has history of hepatic cell carcinoma recently on chemo but was stopped prior to recent admission.  Symptoms ongoing for 3 weeks.  He was admitted for AKI recently.  Has  history of UC as well.  No blood in the stool.  He states he was told this was under well control.  CBC does show mild anemia at 9.5 but this is around his baseline.  No leukocytosis.  UA without evidence of UTI.  CMP shows creatinine 1.61 which is mildly increased from recent metabolic panel but not significantly.  Without other acute concerns.  Stool and C. difficile panel ordered however patient throughout the ED stay which was about 6 hours was unable to provide sample.  He has not had a bowel movement while he has been here.  No abdominal pain associated with this.  He is primarily concerned about getting dehydrated to the extent he was last time.  He would like some IV fluids.  He does have close follow-up.  He does take Lomotil at home.  He takes 3 tablets a day.  We did discuss he could take 2 tablets in the morning and then additional tablet after his bowel movement.  He will reach out to his gastroenterologist.  Strict return precautions given.  Patient does not want to be admitted primarily just wanted IV hydration and to rule out any concerning findings.  He is without abdominal pain.  Had recent imaging.  Given no abdominal pain no need for imaging at this time.  Discussed with attending.  Patient is appropriate for discharge.  Discharged in stable condition.  Final Clinical Impression(s) / ED Diagnoses Final diagnoses:  Diarrhea, unspecified type    Rx / DC Orders ED Discharge Orders     None         Marita Kansas, [name] 01/11/23 2321  Terald Sleeper, MD 01/11/23 [phone]

## 2023-01-11 NOTE — ED Triage Notes (Signed)
Pt came to ED for diarrhea that started 3 weeks ago. Pt states he took medicine to stop it but it has not worked. Pt went 8 times today. Pt has been eating and drinking. Denies n/v. axox4

## 2023-01-11 NOTE — ED Notes (Signed)
Patient verbalizes understanding of discharge instructions. Opportunity for questioning and answers were provided. Armband removed by staff, pt discharged from ED. Pt ambulatory to ED waiting room with steady gait.  

## 2023-01-12 ENCOUNTER — Telehealth: Payer: Self-pay | Admitting: Nurse Practitioner

## 2023-01-12 NOTE — Telephone Encounter (Signed)
Dr. Russella Dar, pls see on call phone note Saturday 01/11/2023. 67 year old male with history of CKD stage III, diabetes mellitus type 2, hypertension, hepatocellular carcinoma on chemotherapy, ulcerative colitis, atrial fibrillation on Eliquis, C. difficile infection. Last Entyvio infusion was 01/07/2023.    Patient with persistent diarrhea, recently admitted to hospital with AKI and diarrhea.    Labs in the ED: Cr was elevated 1.61 with urine glucose > 500. He received IV fluids and was discharged home. Please have your nurse ( I could not recall which nurse is currently assigned to you) contact patient for symptoms update and schedule a follow up appointment with you or APP. THX.

## 2023-01-13 ENCOUNTER — Telehealth: Payer: Self-pay | Admitting: Physician Assistant

## 2023-01-13 ENCOUNTER — Telehealth: Payer: Self-pay

## 2023-01-13 DIAGNOSIS — R197 Diarrhea, unspecified: Secondary | ICD-10-CM

## 2023-01-13 NOTE — Telephone Encounter (Signed)
The pt states he is pushing fluids and will come in on Tue or Wed for labs- the order has been entered.  She states the medication changes have helped.  He will call if needed in the meantime.  He does have follow up already scheduled and he will keep that appt as planned

## 2023-01-13 NOTE — Telephone Encounter (Signed)
Please see Dr. Lanetta Inch note on 01/06/2023 for diarrhea mgmt. Push PO fluids including Gatorade G2 (or something similar) for electrolytes. BMP and CBC Tuesday or Wednesday.

## 2023-01-13 NOTE — Telephone Encounter (Signed)
Patient called stating he was in the ED for diarrhea and dehydration over the weekend. States the doctor instructed him to take lomotil 4 times a day and Colestid 2 tablets 2 times a day. Requesting a call back to discuss these changes of medication dosage. Please advise, thank you.

## 2023-01-13 NOTE — Telephone Encounter (Signed)
Pt called in wanting to ask for a refill of diphenoxylate-atropine (LOMOTIL) 2.5-0.025 MG tablet [[id number]]. Pt states that dosage amount of this med has changed to 4x's a day and this has made him out of this med. Please advise.   LOV: 12/30/22  PHARMACY: [id number] [place], [place] - [address] AT [facility] [address] [place] [address] Phone: [phone]  Fax: [phone]   CB#: [phone]

## 2023-01-14 ENCOUNTER — Ambulatory Visit (HOSPITAL_COMMUNITY): Admission: RE | Admit: 2023-01-14 | Payer: PPO | Source: Ambulatory Visit

## 2023-01-14 ENCOUNTER — Other Ambulatory Visit: Payer: Self-pay

## 2023-01-14 DIAGNOSIS — R152 Fecal urgency: Secondary | ICD-10-CM

## 2023-01-14 DIAGNOSIS — R140 Abdominal distension (gaseous): Secondary | ICD-10-CM

## 2023-01-14 DIAGNOSIS — K51219 Ulcerative (chronic) proctitis with unspecified complications: Secondary | ICD-10-CM

## 2023-01-14 MED ORDER — DIPHENOXYLATE-ATROPINE 2.5-0.025 MG PO TABS
1.0000 | ORAL_TABLET | Freq: Four times a day (QID) | ORAL | 0 refills | Status: DC
Start: 2023-01-14 — End: 2023-01-15

## 2023-01-14 NOTE — Telephone Encounter (Signed)
Inbound call from patient requesting a refill for Lomotil with the higher dosage be sent into [facility]. States he is currently out of medication. Please advise, thank you.

## 2023-01-14 NOTE — Telephone Encounter (Signed)
I have spoken to patient to advise that it appears Dr Tanya Nones sent lomotil moments ago to his pharmacy. He should pick this script up and let us know if the increased dosage does not help. He is reminded to come for labs as recommended and to keep his upcoming office visit with Dr Russella Dar in August.

## 2023-01-15 ENCOUNTER — Telehealth: Payer: Self-pay

## 2023-01-15 ENCOUNTER — Encounter: Payer: Self-pay | Admitting: Nurse Practitioner

## 2023-01-15 ENCOUNTER — Inpatient Hospital Stay (HOSPITAL_BASED_OUTPATIENT_CLINIC_OR_DEPARTMENT_OTHER): Payer: PPO | Admitting: Nurse Practitioner

## 2023-01-15 ENCOUNTER — Inpatient Hospital Stay: Payer: PPO | Attending: Oncology

## 2023-01-15 ENCOUNTER — Other Ambulatory Visit (INDEPENDENT_AMBULATORY_CARE_PROVIDER_SITE_OTHER): Payer: PPO

## 2023-01-15 ENCOUNTER — Other Ambulatory Visit (HOSPITAL_BASED_OUTPATIENT_CLINIC_OR_DEPARTMENT_OTHER): Payer: Self-pay

## 2023-01-15 VITALS — BP 111/68 | HR 69 | Temp 98.2°F | Resp 20 | Ht 70.0 in | Wt 209.6 lb

## 2023-01-15 DIAGNOSIS — T451X5A Adverse effect of antineoplastic and immunosuppressive drugs, initial encounter: Secondary | ICD-10-CM | POA: Diagnosis not present

## 2023-01-15 DIAGNOSIS — R197 Diarrhea, unspecified: Secondary | ICD-10-CM

## 2023-01-15 DIAGNOSIS — C220 Liver cell carcinoma: Secondary | ICD-10-CM | POA: Insufficient documentation

## 2023-01-15 DIAGNOSIS — K51219 Ulcerative (chronic) proctitis with unspecified complications: Secondary | ICD-10-CM | POA: Diagnosis not present

## 2023-01-15 DIAGNOSIS — R140 Abdominal distension (gaseous): Secondary | ICD-10-CM

## 2023-01-15 DIAGNOSIS — R152 Fecal urgency: Secondary | ICD-10-CM | POA: Diagnosis not present

## 2023-01-15 DIAGNOSIS — C7931 Secondary malignant neoplasm of brain: Secondary | ICD-10-CM | POA: Insufficient documentation

## 2023-01-15 DIAGNOSIS — K521 Toxic gastroenteritis and colitis: Secondary | ICD-10-CM | POA: Insufficient documentation

## 2023-01-15 DIAGNOSIS — D649 Anemia, unspecified: Secondary | ICD-10-CM | POA: Diagnosis not present

## 2023-01-15 DIAGNOSIS — R159 Full incontinence of feces: Secondary | ICD-10-CM

## 2023-01-15 LAB — CBC WITH DIFFERENTIAL/PLATELET
Basophils Absolute: 0.1 10*3/uL (ref 0.0–0.1)
Basophils Relative: 1 % (ref 0.0–3.0)
Eosinophils Absolute: 0.2 10*3/uL (ref 0.0–0.7)
Eosinophils Relative: 2.6 % (ref 0.0–5.0)
HCT: 30.4 % — ABNORMAL LOW (ref 39.0–52.0)
Hemoglobin: 9.8 g/dL — ABNORMAL LOW (ref 13.0–17.0)
Lymphocytes Relative: 26.9 % (ref 12.0–46.0)
Lymphs Abs: 1.8 10*3/uL (ref 0.7–4.0)
MCHC: 32.3 g/dL (ref 30.0–36.0)
MCV: 92.5 fl (ref 78.0–100.0)
Monocytes Absolute: 0.8 10*3/uL (ref 0.1–1.0)
Monocytes Relative: 11.6 % (ref 3.0–12.0)
Neutro Abs: 4 10*3/uL (ref 1.4–7.7)
Neutrophils Relative %: 57.9 % (ref 43.0–77.0)
Platelets: 278 10*3/uL (ref 150.0–400.0)
RBC: 3.28 Mil/uL — ABNORMAL LOW (ref 4.22–5.81)
RDW: 27.3 % — ABNORMAL HIGH (ref 11.5–15.5)
WBC: 6.8 10*3/uL (ref 4.0–10.5)

## 2023-01-15 LAB — CMP (CANCER CENTER ONLY)
ALT: 24 U/L (ref 0–44)
AST: 38 U/L (ref 15–41)
Albumin: 2.7 g/dL — ABNORMAL LOW (ref 3.5–5.0)
Alkaline Phosphatase: 78 U/L (ref 38–126)
Anion gap: 6 (ref 5–15)
BUN: 17 mg/dL (ref 8–23)
CO2: 27 mmol/L (ref 22–32)
Calcium: 7.9 mg/dL — ABNORMAL LOW (ref 8.9–10.3)
Chloride: 107 mmol/L (ref 98–111)
Creatinine: 1.24 mg/dL (ref 0.61–1.24)
GFR, Estimated: >60 mL/min (ref >60)
Glucose, Bld: 127 mg/dL — ABNORMAL HIGH (ref 70–99)
Potassium: 4.1 mmol/L (ref 3.5–5.1)
Sodium: 140 mmol/L (ref 135–145)
Total Bilirubin: 0.9 mg/dL (ref 0.3–1.2)
Total Protein: 5.1 g/dL — ABNORMAL LOW (ref 6.5–8.1)

## 2023-01-15 LAB — BASIC METABOLIC PANEL
BUN: 15 mg/dL (ref 6–23)
CO2: 23 mEq/L (ref 19–32)
Calcium: 8.2 mg/dL — ABNORMAL LOW (ref 8.4–10.5)
Chloride: 106 mEq/L (ref 96–112)
Creatinine, Ser: 1.21 mg/dL (ref 0.40–1.50)
GFR: 61.99 mL/min (ref >60.00)
Glucose, Bld: 114 mg/dL — ABNORMAL HIGH (ref 70–99)
Potassium: 3.9 mEq/L (ref 3.5–5.1)
Sodium: 141 mEq/L (ref 135–145)

## 2023-01-15 MED ORDER — OPIUM 10 MG/ML (1%) PO TINC
0.6000 mL | Freq: Four times a day (QID) | ORAL | 0 refills | Status: DC | PRN
Start: 2023-01-15 — End: 2023-03-24

## 2023-01-15 MED ORDER — DIPHENOXYLATE-ATROPINE 2.5-0.025 MG PO TABS
1.0000 | ORAL_TABLET | Freq: Four times a day (QID) | ORAL | 0 refills | Status: DC
Start: 2023-01-15 — End: 2023-03-24
  Filled 2023-01-15 – 2023-01-16 (×3): qty 60, 8d supply, fill #0

## 2023-01-15 NOTE — Telephone Encounter (Signed)
Transition Care Management Follow-up Telephone Call Date of discharge and from where: Matthew Chen 6/1 How have you been since you were released from the hospital? better Any questions or concerns? No  Items Reviewed: Did the pt receive and understand the discharge instructions provided? Yes  Medications obtained and verified? Yes  Other? No  Any new allergies since your discharge? No  Dietary orders reviewed? No Do you have support at home? Yes   Follow up appointments reviewed:  PCP Hospital f/u appt confirmed? No  Scheduled to see PCP  on 6/10 @ . Specialist Hospital f/u appt confirmed? No  Scheduled to see  on  @ . Are transportation arrangements needed? No  If their condition worsens, is the pt aware to call PCP or go to the Emergency Dept.? Yes Was the patient provided with contact information for the PCP's office or ED? Yes Was to pt encouraged to call back with questions or concerns? Yes

## 2023-01-15 NOTE — Progress Notes (Signed)
[facility] OFFICE PROGRESS NOTE   Diagnosis: Hepatocellular carcinoma  INTERVAL HISTORY:   Matthew Chen returns for follow-up.  He was recently hospitalized with increased diarrhea.  The diarrhea was felt to be secondary to cabozantinib.  Cabozantinib is on hold.  He continues to have loose stools.  Yesterday he estimates 4-5.  He is having trouble obtaining Lomotil.  He is taking 2 tablets twice a day.  He notes lower extremity edema.  He reports hydrochlorothiazide was recently stopped.  No rash.  No nausea or vomiting.  Objective:  Vital signs in last 24 hours:  Blood pressure 111/68, pulse 69, temperature 98.2 F (36.8 C), temperature source Oral, resp. rate 20, height 5\' 10"  (1.778 m), weight 209 lb 9.6 oz (95.1 kg), SpO2 98 %.    HEENT: No thrush or ulcers. Resp: Lungs clear bilaterally. Cardio: Regular rate and rhythm. GI: Abdomen soft and nontender.  No hepatosplenomegaly. Vascular: Pitting edema below the knees bilaterally. Skin: Scattered ecchymoses on extremities.   Lab Results:  Lab Results  Component Value Date   WBC 4.9 01/11/2023   HGB 9.5 (L) 01/11/2023   HCT 29.2 (L) 01/11/2023   MCV 93.9 01/11/2023   PLT 246 01/11/2023   NEUTROABS 4.8 12/31/2022    Imaging:  No results found.  Medications: I have reviewed the patient's current medications.  Assessment/Plan: Hepatocellular carcinoma CT angio chest aorta 01/30/2021-no evidence of thoracic aortic aneurysm, diffuse hepatic steatosis, suggestion of early cirrhosis, 7.1 cm enhancing masslike area in segment 5/6, prominent gastrohepatic and periportal nodes measuring up to 7 mm MRI liver 03/22/2021-arterial hyperenhancing subcapsular mass, segment 6, 7 x 5.1 cm, LI-RADS 5, no pathologically large lymph nodes Ultrasound-guided biopsy 04/24/2021-hepatocellular carcinoma Y90 radioembolization of segment 6 and segment 7 hepatic arterial branches 06/21/2021 MRI abdomen 09/03/2021-new increased  enhancement inferior and superior to the dominant right liver lesion, new 11 mm enhancing lesion in the left liver LIRADS 3, dominant right hepatic lesion stable in size MRI abdomen 12/19/2021-unchanged hyperenhancing mass in the anterior inferior right liver measuring 7.8 x 5 cm, interval enlargement of a arterial enhancing lesion in segment 2- LIRADS 5, new small enhancing lesions hepatic segment 4A measuring 1.2 x 0.9 cm and 0.8 x 0.6 cm- LIRADS 5 Cycle 1 atezolizumab /bevacizumab 02/07/2022 Cycle 2 atezolizumab/bevacizumab 02/27/2022 Cycle 3 atezolizumab/bevacizumab 03/21/2022 Cycle 4 atezolizumab/bevacizumab 04/22/2022 MRI abdomen 05/05/2022-no change in dominant segment 6 lesion, no change in hyperenhancing lesions in segment 4A and segment 2, no new lesions Cycle 5 atezolizumab/bevacizumab 05/13/2022 Cycle 6 atezolizumab/bevacizumab 06/04/2022 Cycle 7 atezolizumab/bevacizumab 06/24/2022 Cycle 8 atezolizumab/bevacizumab 07/15/2022 MRI abdomen 07/20/2022-mild progression of bilateral liver lesions, no evidence of extrahepatic metastatic disease MRI abdomen [facility] 08/19/2022-increased size of the dominant admitted 5/6 lesion, now measuring 12.7 x 8.8 cm, multiple additional hepatic lesions appear larger than on the previous exam, stable mildly enlarged upper abdominal lymph nodes Lenvatinib 09/09/2022, stopped 10/04/2022 Cabozantinib 10/11/2022 MRI abdomen 12/27/2022-technical limitation on the 07/20/2022 exam makes direct comparison difficult, notes significant change in the enhancing liver lesions Cabozantinib placed on hold 12/31/2022 Ulcerative colitis-maintained on vedolizumab Peripheral arterial disease History of a thoracic aortic aneurysm CAD Diabetes Hypertension Chronic renal failure Paroxysmal atrial fibrillation Admission 01/02/2023 with increased diarrhea-sigmoidoscopy with a single ulcer at the dentate line and an anal fissure, sigmoid colon unremarkable.  Negative enteric pathogen  panel  Disposition: Matthew Chen appears stable.  Cabozantinib was placed on hold beginning 12/31/2022 due to diarrhea.  The diarrhea is better but persists.  We refilled the prescription for  Lomotil at today's visit.  We are also sending a prescription for tincture of opium to his pharmacy.  He will continue to push fluids orally.  His case will be presented at an upcoming GI tumor conference.  He will return for lab and follow-up in 1 week.  Patient seen with Dr. Truett Perna.   Lonna Cobb ANP/GNP-BC   01/15/2023  2:08 PM This was a shared visit with Lonna Cobb.  Matthew Chen was interviewed and examined.  He has persistent diarrhea, potentially related to cabozantinib.  He will continue Imodium and Lomotil.  He will try tincture of opium if the diarrhea persists.  He will call for increased diarrhea and symptoms of dehydration.  We discussed treatment options for the hepatocellular carcinoma.  He has previously been treated with Y90 to the right liver, atezolizumab/bevacizumab, lenvatinib, and cabozantinib.  Systemic treatment options are limited.  He does not wish to receive further treatment which could cause diarrhea.  We will present his case at the GI tumor conference to discuss his eligibility for additional hepatic directed therapy.  I was present for greater than 50% of today's visit.  I performed medical stage making.  Mancel Bale, MD

## 2023-01-16 ENCOUNTER — Other Ambulatory Visit (HOSPITAL_BASED_OUTPATIENT_CLINIC_OR_DEPARTMENT_OTHER): Payer: Self-pay

## 2023-01-16 NOTE — Telephone Encounter (Signed)
See alternate results note.  

## 2023-01-16 NOTE — Telephone Encounter (Signed)
Patient is returning Matthew Chen's call 

## 2023-01-17 ENCOUNTER — Other Ambulatory Visit: Payer: Self-pay

## 2023-01-20 ENCOUNTER — Ambulatory Visit (INDEPENDENT_AMBULATORY_CARE_PROVIDER_SITE_OTHER): Payer: PPO | Admitting: Family Medicine

## 2023-01-20 ENCOUNTER — Encounter: Payer: Self-pay | Admitting: Family Medicine

## 2023-01-20 VITALS — BP 120/70 | HR 66 | Temp 98.9°F | Ht 70.0 in | Wt 215.0 lb

## 2023-01-20 DIAGNOSIS — Z5111 Encounter for antineoplastic chemotherapy: Secondary | ICD-10-CM | POA: Insufficient documentation

## 2023-01-20 DIAGNOSIS — E1165 Type 2 diabetes mellitus with hyperglycemia: Secondary | ICD-10-CM | POA: Insufficient documentation

## 2023-01-20 DIAGNOSIS — C220 Liver cell carcinoma: Secondary | ICD-10-CM

## 2023-01-20 LAB — CBC WITH DIFFERENTIAL/PLATELET
Basophils Absolute: 40 cells/uL (ref 0–200)
Monocytes Relative: 14.1 %
Neutro Abs: 2870 cells/uL (ref 1500–7800)
Total Lymphocyte: 25.1 %

## 2023-01-20 MED ORDER — FUROSEMIDE 40 MG PO TABS: 40.0000 mg | ORAL_TABLET | Freq: Every day | ORAL | 0 refills | Status: DC | PRN

## 2023-01-20 NOTE — Progress Notes (Signed)
Subjective:    Patient ID: Matthew Chen, male    DOB: 03-11-1956, 67 y.o.   MRN: [id number]  Admit date:     01/02/2023  Discharge date: 01/06/23  Discharge Physician: Rickey Primus    PCP: Donita Brooks, MD    Recommendations at discharge:  PCP follow-up GI follow-up Resume Eliquis in 2 days   Discharge Diagnoses: Principal Problem:   AKI (acute kidney injury) (HCC) Active Problems:   Diarrhea   Ulcerative colitis (HCC)   Hepatocellular carcinoma (HCC)   Hypertension   CKD (chronic kidney disease), stage III (HCC)   Paroxysmal atrial fibrillation (HCC)   Type 2 diabetes mellitus with other specified complication (HCC)   Rectal pain   Anal fissure   Resolved Problems:   * No resolved hospital problems. *   Hospital Course: Matthew Chen is a 67 y.o. male with a history of CKD stage III, diabetes mellitus type 2, hypertension, hepatocellular carcinoma on chemotherapy, ulcerative colitis, atrial fibrillation on Eliquis, C. difficile infection.  Patient presented secondary to weakness and diarrhea and was found to have associated AKI on admission.  Patient started on IV fluids.  GI consulted for diarrhea evaluation.   Assessment and Plan:   AKI on CKD stage IIIa Secondary to fluid loss from diarrhea.  Baseline creatinine appears to be around 1.5-1.8.  Creatinine of 2.56 on admission.  Creatinine improved to 1.57 with IV fluids.  Diarrhea has improved.  AKI resolved.   Diarrhea Unclear etiology.  Concern that this may be related to ulcerative colitis.  Patient with elevated ESR and CRP.  GI pathogen panel and C. difficile panel are negative.  [facility] consulted with concern for possible chemotherapy-induced diarrhea versus possible ulcerative colitis flare.  GI performed flexible sigmoidoscopy on 5/24 which was significant for anal fissure solitary ulcer in the dentate line/anal canal with biopsy obtained. Lomotil on discharge. GI follow-up for biopsy results. Resume  Eliquis in 2 days per GI recommendations.   GI bleeding Multiple episodes of GI bleeding with blood clots overnight and this morning.  Likely related to Eliquis use in addition to recent endoscopic procedure with biopsy performed. Hemoglobin drifty down from 11.9 to 9.6. No recurrent GI bleeding overnight. Hemoglobin stable at 9.3 prior to discharge.   Hepatocellular carcinoma Patient follows with hematology oncology as an outpatient.  Patient was on Cabometyx which was held prior to admission.   Ulcerative colitis Known history.  Previously in remission; patient is also managed on Entyvio.  Concern for possible flare but this is unclear.  GI consulted as mentioned above.   Diabetes type 2 Patient is on Jardiance Januvia, Actos as an outpatient. Continue on discharge.   Paroxysmal atrial fibrillation Continue amiodarone. Recommendation to resume Eliquis in two days (5/29).   Primary hypertension Patient is on Coreg and lisinopril as an outpatient. Continue regimen on discharge.   01/20/23 Patient presents today for hospital follow-up.  His sigmoidoscopy/biopsy revealed no evidence of completed.  There was diarrhea was attributed to his chemotherapy.  He has been off the chemotherapy medication now for couple weeks.  The severe diarrhea and fecal incontinence is improving.  He feels much better.  His weight is up substantially due to IV fluid.  His baseline creatinine is around 1.5-1.6.  On his hospital admission he was greater than 2 due to dehydration.  At his last discharge, his creatinine was below his baseline at 1.2.  I believe that he was likely over hydrated with IV fluid.  The patient has +1 pitting edema in both legs today distal to the knee.  His legs ache due to the swelling.  He is asking if he can take a fluid pill to help with the swelling.  Currently he is not sure what he is going to do about his liver cancer.  He believes that he is going to stop treatment.  He does not want to  feel as bad as he felt before.  This point, his goal is to make it through October to see the wedding of his granddaughter.  At this point he is interested in comfort measures however he is going to discuss his situation with his oncologist next week.  Past Medical History:  Diagnosis Date   Adenomatous colon polyp 03/2008   Anginal pain (HCC)    Aortic aneurysm, thoracic (HCC) 09/09/2016   4.4 cm by echo 05/2015   Arthritis    "joints in my fingers ache" (07/13/2015)   Bicuspid aortic valve 09/09/2016   Cataract 2019   bilateral eyes   CKD (chronic kidney disease), stage III (HCC)    Coronary artery disease    Diabetes mellitus without complication (HCC)    Hemorrhoids    Hepatocellular carcinoma (HCC)    Hyperlipidemia    Hypertension    Paroxysmal atrial fibrillation (HCC)    Peripheral arterial disease (HCC)    a. dopppler 05/29/2015 revealed high-grade right popliteal and distal left SFA disease b. LE angio 06/28/2015 revealing high grade calcific dx with distal L SFA and R popliteal artery s/p diamondback orbital rotational atherectomy, PTA of L SFA  c. 07/13/2015 diamondback orbital rotational atherectomy and drug eluting balloon angioplasty of R popliteal artery (P2 segment) using distal protection    Type II diabetes mellitus (HCC)    Ulcerative colitis    Past Surgical History:  Procedure Laterality Date   ABDOMINAL AORTOGRAM W/LOWER EXTREMITY N/A 04/27/2018   Procedure: ABDOMINAL AORTOGRAM W/LOWER EXTREMITY;  Surgeon: Runell Gess, MD;  Location: [facility] INVASIVE CV LAB;  Service: Cardiovascular;  Laterality: N/A;   BIOPSY  01/03/2023   Procedure: BIOPSY;  Surgeon: Benancio Deeds, MD;  Location: [facility] ENDOSCOPY;  Service: Gastroenterology;;   COLONOSCOPY     COLONOSCOPY W/ POLYPECTOMY  X 4-5   CORONARY ATHERECTOMY N/A 02/15/2021   Procedure: CORONARY ATHERECTOMY;  Surgeon: Runell Gess, MD;  Location: [facility] INVASIVE CV LAB;  Service: Cardiovascular;  Laterality: N/A;   aborted    CORONARY BALLOON ANGIOPLASTY N/A 02/15/2021   Procedure: CORONARY BALLOON ANGIOPLASTY;  Surgeon: Runell Gess, MD;  Location: [facility] INVASIVE CV LAB;  Service: Cardiovascular;  Laterality: N/A;   COSMETIC SURGERY  < 2000   "removed birthmark from top of my head"   FLEXIBLE SIGMOIDOSCOPY N/A 01/03/2023   Procedure: FLEXIBLE SIGMOIDOSCOPY;  Surgeon: Benancio Deeds, MD;  Location: [facility] ENDOSCOPY;  Service: Gastroenterology;  Laterality: N/A;   IR ANGIOGRAM SELECTIVE EACH ADDITIONAL VESSEL  06/08/2021   IR ANGIOGRAM SELECTIVE EACH ADDITIONAL VESSEL  06/08/2021   IR ANGIOGRAM SELECTIVE EACH ADDITIONAL VESSEL  06/08/2021   IR ANGIOGRAM SELECTIVE EACH ADDITIONAL VESSEL  06/08/2021   IR ANGIOGRAM SELECTIVE EACH ADDITIONAL VESSEL  06/21/2021   IR ANGIOGRAM SELECTIVE EACH ADDITIONAL VESSEL  06/21/2021   IR ANGIOGRAM SELECTIVE EACH ADDITIONAL VESSEL  06/21/2021   IR ANGIOGRAM SELECTIVE EACH ADDITIONAL VESSEL  06/21/2021   IR ANGIOGRAM VISCERAL SELECTIVE  06/08/2021   IR ANGIOGRAM VISCERAL SELECTIVE  06/21/2021   IR EMBO ARTERIAL NOT HEMORR HEMANG INC GUIDE ROADMAPPING  06/08/2021   IR EMBO TUMOR ORGAN ISCHEMIA INFARCT INC GUIDE ROADMAPPING  06/21/2021   IR EMBO TUMOR ORGAN ISCHEMIA INFARCT INC GUIDE ROADMAPPING  06/21/2021   IR RADIOLOGIST EVAL & MGMT  05/08/2021   IR RADIOLOGIST EVAL & MGMT  07/26/2021   IR RADIOLOGIST EVAL & MGMT  09/05/2021   IR RADIOLOGIST EVAL & MGMT  12/27/2021   IR US GUIDE VASC ACCESS LEFT  06/21/2021   IR US GUIDE VASC ACCESS RIGHT  06/08/2021   LEFT HEART CATH AND CORONARY ANGIOGRAPHY N/A 02/08/2021   Procedure: LEFT HEART CATH AND CORONARY ANGIOGRAPHY;  Surgeon: Runell Gess, MD;  Location: [facility] INVASIVE CV LAB;  Service: Cardiovascular;  Laterality: N/A;   PERIPHERAL VASCULAR ATHERECTOMY  04/27/2018   Procedure: PERIPHERAL VASCULAR ATHERECTOMY;  Surgeon: Runell Gess, MD;  Location[facility] INVASIVE CV LAB;  Service: Cardiovascular;;  right popliteal  artery   PERIPHERAL VASCULAR CATHETERIZATION N/A 06/29/2015   Procedure: Lower Extremity Angiography;  Surgeon: Runell Gess, MD;  Location: [facility] INVASIVE CV LAB;  Service: Cardiovascular;  Laterality: N/A;   PERIPHERAL VASCULAR CATHETERIZATION N/A 07/13/2015   Procedure: Lower Extremity Angiography;  Surgeon: Runell Gess, MD;  Location: [facility] INVASIVE CV LAB;  Service: Cardiovascular;  Laterality: N/A;   PERIPHERAL VASCULAR CATHETERIZATION  07/13/2015   Procedure: Peripheral Vascular Atherectomy;  Surgeon: Runell Gess, MD;  Location: [facility] INVASIVE CV LAB;  Service: Cardiovascular;;   PERIPHERAL VASCULAR INTERVENTION  04/27/2018   Procedure: PERIPHERAL VASCULAR INTERVENTION;  Surgeon: Runell Gess, MD;  Location: [facility] INVASIVE CV LAB;  Service: Cardiovascular;;  right popliteal artery   POLYPECTOMY     TONSILLECTOMY     UPPER GASTROINTESTINAL ENDOSCOPY     Current Outpatient Medications on File Prior to Visit  Medication Sig Dispense Refill   acetaminophen (TYLENOL) 500 MG tablet Take 1 tablet (500 mg total) by mouth every 6 (six) hours as needed. (Patient taking differently: Take 500 mg by mouth every 6 (six) hours as needed for moderate pain.) 30 tablet 0   AMBULATORY NON FORMULARY MEDICATION Diltiazem 2%/Lidocaine 2% apply a pea sized amount PR TID 30 g 1   amiodarone (PACERONE) 200 MG tablet Take 1 tablet (200 mg total) by mouth daily. 90 tablet 3   apixaban (ELIQUIS) 5 MG TABS tablet Take 1 tablet (5 mg total) by mouth 2 (two) times daily.     aspirin EC 81 MG tablet Take 1 tablet (81 mg total) by mouth daily. 90 tablet 3   atorvastatin (LIPITOR) 80 MG tablet Take 1 tablet (80 mg total) by mouth daily. 90 tablet 3   carvedilol (COREG) 12.5 MG tablet Take 1 tablet (12.5 mg total) by mouth 2 (two) times daily with a meal. 180 tablet 3   colestipol (COLESTID) 1 g tablet Take 1 tablet (1 g total) by mouth 2 (two) times daily. 60 tablet 0   cyanocobalamin (VITAMIN B12) 1000 MCG  tablet Take 1,000 mcg by mouth daily.     diltiazem (CARDIZEM) 30 MG tablet Take 1 tablet (30 mg total) by mouth 3 (three) times daily as needed (palpitations). 90 tablet 3   diphenoxylate-atropine (LOMOTIL) 2.5-0.025 MG tablet Take 1-2 tablets by mouth 4 (four) times daily. (Patient taking differently: Take 1-2 tablets by mouth 4 (four) times daily. Pt states he is taking 8 pills per day.) 60 tablet 0   empagliflozin (JARDIANCE) 10 MG TABS tablet Take 1 tablet (10 mg total) by mouth daily. 90 tablet 3   fenofibrate 160 MG  tablet Take 1 tablet (160 mg total) by mouth daily. 90 tablet 3   glucose blood (ONETOUCH ULTRA) test strip CHECK BLOOD SUGAR TWICE A DAY 50 strip 15   Insulin Pen Needle (B-D UF III MINI PEN NEEDLES) 31G X 5 MM MISC Use daily with Victoza 100 each 11   Lidocaine-Menthol, Spray, 4-1 % LIQD Apply 1 spray topically as needed. 118 mL 1   lisinopril (ZESTRIL) 20 MG tablet Take 1 tablet (20 mg total) by mouth daily. 90 tablet 3   magic mouthwash w/lidocaine SOLN Take 5 mLs by mouth every 6 (six) hours as needed for mouth pain. 550 mL 2   mesalamine (CANASA) 1000 MG suppository Place 1 suppository (1,000 mg total) rectally at bedtime. 30 suppository 0   Omega-3 Fatty Acids (FISH OIL) 1000 MG CAPS Take 1,000 mg by mouth 2 (two) times daily.     Opium 10 MG/ML (1%) TINC Take 0.6 mLs (6 mg total) by mouth every 6 (six) hours as needed for diarrhea or loose stools. 118 mL 0   oxyCODONE (ROXICODONE) 5 MG immediate release tablet Take 1 tablet (5 mg total) by mouth every 4 (four) hours as needed for severe pain. 30 tablet 0   pantoprazole (PROTONIX) 40 MG tablet Take 1 tablet (40 mg total) by mouth 2 (two) times daily. 60 tablet 11   pioglitazone (ACTOS) 30 MG tablet TAKE 1 TABLET BY MOUTH EVERY DAY. STRENGTH: 30 MG (Patient taking differently: Take 30 mg by mouth daily.) 90 tablet 1   sildenafil (VIAGRA) 100 MG tablet Take 0.5-1 tablets (50-100 mg total) by mouth daily as needed for erectile  dysfunction. 5 tablet 11   sitaGLIPtin (JANUVIA) 100 MG tablet Take 1 tablet (100 mg total) by mouth daily. 90 tablet 3   vedolizumab (ENTYVIO) 300 MG injection Inject 300 mg as directed See admin instructions. Every 2 months     No current facility-administered medications on file prior to visit.   Allergies  Allergen Reactions   Penicillins Other (See Comments)    Unsure, childhood reaction Has patient had a PCN reaction causing immediate rash, facial/tongue/throat swelling, SOB or lightheadedness with hypotension: Unknown Has patient had a PCN reaction causing severe rash involving mucus membranes or skin necrosis: Unknown Has patient had a PCN reaction that required hospitalization: No Has patient had a PCN reaction occurring within the last 10 years: No If all of the above answers are "NO", then may proceed with Cephalosporin use.    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, building services  Tobacco Use   Smoking status: Former    Packs/day: 1.50    Years: 25.00    Additional pack years: 0.00    Total pack years: 37.50    Types: Cigarettes    Quit date: 08/12/1996    Years since quitting: 26.4   Smokeless tobacco: Never   Tobacco comments:    Former smoker 09/17/2022  Vaping Use   Vaping Use: Never used  Substance and Sexual Activity   Alcohol use: Yes    Alcohol/week: 1.0 standard drink of alcohol    Types: 1 Cans of beer per week    Comment: social use   Drug use: No   Sexual activity:  Yes  Other Topics Concern   Not on file  Social History Narrative   ** Merged History Encounter **       Social Determinants of Health   Financial Resource Strain: Low Risk  (01/08/2023)   Overall Financial Resource Strain (CARDIA)    Difficulty of Paying  Living Expenses: Not hard at all  Food Insecurity: No Food Insecurity (01/08/2023)   Hunger Vital Sign    Worried About Running Out of Food in the Last Year: Never true    Ran Out of Food in the Last Year: Never true  Transportation Needs: No Transportation Needs (01/08/2023)   PRAPARE - Administrator, Civil Service (Medical): No    Lack of Transportation (Non-Medical): No  Physical Activity: Not on file  Stress: Not on file  Social Connections: Not on file  Intimate Partner Violence: Not At Risk (01/03/2023)   Humiliation, Afraid, Rape, and Kick questionnaire    Fear of Current or Ex-Partner: No    Emotionally Abused: No    Physically Abused: No    Sexually Abused: No      Review of Systems  All other systems reviewed and are negative.      Objective:   Physical Exam Vitals reviewed.  Constitutional:      General: He is not in acute distress.    Appearance: He is well-developed. He is not ill-appearing, toxic-appearing or diaphoretic.  Neck:     Thyroid: No thyromegaly.     Vascular: No JVD.  Cardiovascular:     Rate and Rhythm: Normal rate. Rhythm irregular.     Heart sounds: Murmur heard.  Pulmonary:     Effort: Pulmonary effort is normal. No respiratory distress.     Breath sounds: Normal breath sounds. No wheezing or rales.  Abdominal:     General: Bowel sounds are normal. There is no distension.     Palpations: Abdomen is soft.     Tenderness: There is no abdominal tenderness. There is no guarding or rebound.  Musculoskeletal:     Right lower leg: Edema present.     Left lower leg: Edema present.  Skin:    General: Skin is warm.     Coloration: Skin is not pale.     Findings: No rash.  Neurological:     Mental Status: He is alert.           Assessment & Plan:  Hepatocellular carcinoma (HCC) - Plan: CBC with Differential/Platelet, COMPLETE METABOLIC PANEL WITH GFR Patient looks much better today than he looked at his last visit.  He was  extremely dehydrated and also in tremendous pain.  The fecal leakage, and the uncontrollable diarrhea all have improved since holding his chemotherapy medication.  He continues to take Lomotil 2 tablets 4 times a day and is also on colestipol seems to be helping.  I recommended adding a fiber supplement as well.  Check CBC today to monitor his drop in hemoglobin.  Check CMP to monitor his renal function.  I will give him Lasix 40 mg once daily as needed leg swelling.  I do not want the patient to become dehydrated.  I anticipate that he will take medication 2 to 3 days and then be able to stop it.  His baseline creatinine is around 1.5.  Patient will discuss whether he wants to continue treatment for his cancer next week with his oncologist.  We discussed the possible hospice referral.  At the present time he elects to  defer this for now

## 2023-01-21 LAB — COMPLETE METABOLIC PANEL WITH GFR
AG Ratio: 1 (calc) (ref 1.0–2.5)
ALT: 23 U/L (ref 9–46)
AST: 40 U/L — ABNORMAL HIGH (ref 10–35)
Albumin: 2.6 g/dL — ABNORMAL LOW (ref 3.6–5.1)
Alkaline phosphatase (APISO): 108 U/L (ref 35–144)
BUN: 16 mg/dL (ref 7–25)
CO2: 26 mmol/L (ref 20–32)
Calcium: 7.9 mg/dL — ABNORMAL LOW (ref 8.6–10.3)
Chloride: 110 mmol/L (ref 98–110)
Creat: 1.21 mg/dL (ref 0.70–1.35)
Globulin: 2.6 g/dL (calc) (ref 1.9–3.7)
Glucose, Bld: 137 mg/dL — ABNORMAL HIGH (ref 65–99)
Potassium: 4 mmol/L (ref 3.5–5.3)
Sodium: 142 mmol/L (ref 135–146)
Total Bilirubin: 0.8 mg/dL (ref 0.2–1.2)
Total Protein: 5.2 g/dL — ABNORMAL LOW (ref 6.1–8.1)
eGFR: 66 mL/min/{1.73_m2} (ref >=60)

## 2023-01-21 LAB — CBC WITH DIFFERENTIAL/PLATELET
Absolute Monocytes: 705 cells/uL (ref 200–950)
Basophils Relative: 0.8 %
Eosinophils Absolute: 130 cells/uL (ref 15–500)
Eosinophils Relative: 2.6 %
HCT: 27.7 % — ABNORMAL LOW (ref 38.5–50.0)
Hemoglobin: 8.8 g/dL — ABNORMAL LOW (ref 13.2–17.1)
Lymphs Abs: 1255 cells/uL (ref 850–3900)
MCH: 30.8 pg (ref 27.0–33.0)
MCHC: 31.8 g/dL — ABNORMAL LOW (ref 32.0–36.0)
MCV: 96.9 fL (ref 80.0–100.0)
MPV: 10.9 fL (ref 7.5–12.5)
Neutrophils Relative %: 57.4 %
Platelets: 238 10*3/uL (ref 140–400)
RBC: 2.86 10*6/uL — ABNORMAL LOW (ref 4.20–5.80)
RDW: 21.3 % — ABNORMAL HIGH (ref 11.0–15.0)
WBC: 5 10*3/uL (ref 3.8–10.8)

## 2023-01-22 ENCOUNTER — Other Ambulatory Visit: Payer: Self-pay

## 2023-01-22 ENCOUNTER — Inpatient Hospital Stay (HOSPITAL_BASED_OUTPATIENT_CLINIC_OR_DEPARTMENT_OTHER): Payer: PPO | Admitting: Oncology

## 2023-01-22 ENCOUNTER — Inpatient Hospital Stay: Payer: PPO

## 2023-01-22 VITALS — BP 106/71 | HR 84 | Temp 98.1°F | Resp 18 | Ht 70.0 in | Wt 209.4 lb

## 2023-01-22 DIAGNOSIS — C220 Liver cell carcinoma: Secondary | ICD-10-CM

## 2023-01-22 DIAGNOSIS — K51219 Ulcerative (chronic) proctitis with unspecified complications: Secondary | ICD-10-CM

## 2023-01-22 DIAGNOSIS — R152 Fecal urgency: Secondary | ICD-10-CM

## 2023-01-22 DIAGNOSIS — R140 Abdominal distension (gaseous): Secondary | ICD-10-CM

## 2023-01-22 DIAGNOSIS — D649 Anemia, unspecified: Secondary | ICD-10-CM

## 2023-01-22 LAB — CBC WITH DIFFERENTIAL (CANCER CENTER ONLY)
Abs Immature Granulocytes: 0.04 10*3/uL (ref 0.00–0.07)
Basophils Absolute: 0 10*3/uL (ref 0.0–0.1)
Basophils Relative: 1 %
Eosinophils Absolute: 0.1 10*3/uL (ref 0.0–0.5)
Eosinophils Relative: 2 %
HCT: 27.3 % — ABNORMAL LOW (ref 39.0–52.0)
Hemoglobin: 8.6 g/dL — ABNORMAL LOW (ref 13.0–17.0)
Immature Granulocytes: 1 %
Lymphocytes Relative: 21 %
Lymphs Abs: 1.2 10*3/uL (ref 0.7–4.0)
MCH: 30.3 pg (ref 26.0–34.0)
MCHC: 31.5 g/dL (ref 30.0–36.0)
MCV: 96.1 fL (ref 80.0–100.0)
Monocytes Absolute: 0.7 10*3/uL (ref 0.1–1.0)
Monocytes Relative: 13 %
Neutro Abs: 3.6 10*3/uL (ref 1.7–7.7)
Neutrophils Relative %: 62 %
Platelet Count: 246 10*3/uL (ref 150–400)
RBC: 2.84 MIL/uL — ABNORMAL LOW (ref 4.22–5.81)
RDW: 25.2 % — ABNORMAL HIGH (ref 11.5–15.5)
WBC Count: 5.8 10*3/uL (ref 4.0–10.5)
nRBC: 0.9 % — ABNORMAL HIGH (ref 0.0–0.2)

## 2023-01-22 LAB — CMP (CANCER CENTER ONLY)
ALT: 26 U/L (ref 0–44)
AST: 44 U/L — ABNORMAL HIGH (ref 15–41)
Albumin: 3 g/dL — ABNORMAL LOW (ref 3.5–5.0)
Alkaline Phosphatase: 89 U/L (ref 38–126)
Anion gap: 7 (ref 5–15)
BUN: 20 mg/dL (ref 8–23)
CO2: 28 mmol/L (ref 22–32)
Calcium: 8.3 mg/dL — ABNORMAL LOW (ref 8.9–10.3)
Chloride: 106 mmol/L (ref 98–111)
Creatinine: 1.23 mg/dL (ref 0.61–1.24)
GFR, Estimated: >60 mL/min (ref >60)
Glucose, Bld: 164 mg/dL — ABNORMAL HIGH (ref 70–99)
Potassium: 3.5 mmol/L (ref 3.5–5.1)
Sodium: 141 mmol/L (ref 135–145)
Total Bilirubin: 0.9 mg/dL (ref 0.3–1.2)
Total Protein: 5.7 g/dL — ABNORMAL LOW (ref 6.5–8.1)

## 2023-01-22 MED ORDER — IRON (FERROUS SULFATE) 325 (65 FE) MG PO TABS
325.0000 mg | ORAL_TABLET | Freq: Every day | ORAL | 3 refills | Status: DC
Start: 2023-01-22 — End: 2023-05-26

## 2023-01-22 NOTE — Progress Notes (Signed)
[facility] OFFICE PROGRESS NOTE   Diagnosis: Hepatocellular carcinoma  INTERVAL HISTORY:   Matthew Chen returns as scheduled.  He continues to have frequent loose stools.  This has improved partially compared to when we saw him last week.  He does not feel dehydrated.  He is eating and drinking.  He takes Imodium and Lomotil.  The tincture of opium has not yet been approved by his insurance company. He was placed on furosemide when he saw Dr. Tanya Nones.  He has noted mild improvement in the leg swelling. Objective:  Vital signs in last 24 hours:  Blood pressure 106/71, pulse 84, temperature 98.1 F (36.7 C), temperature source Oral, resp. rate 18, height 5\' 10"  (1.778 m), weight 209 lb 6.4 oz (95 kg), SpO2 99 %.     Resp: Lungs clear bilaterally Cardio: Regular rate and rhythm GI: No hepatosplenomegaly, no apparent ascites Vascular: 1+ pitting edema at the lower leg bilaterally  Lab Results:  Lab Results  Component Value Date   WBC 5.8 01/22/2023   HGB 8.6 (L) 01/22/2023   HCT 27.3 (L) 01/22/2023   MCV 96.1 01/22/2023   PLT 246 01/22/2023   NEUTROABS 3.6 01/22/2023    CMP  Lab Results  Component Value Date   NA 142 01/20/2023   K 4.0 01/20/2023   CL 110 01/20/2023   CO2 26 01/20/2023   GLUCOSE 137 (H) 01/20/2023   BUN 16 01/20/2023   CREATININE 1.21 01/20/2023   CALCIUM 7.9 (L) 01/20/2023   PROT 5.2 (L) 01/20/2023   ALBUMIN 2.7 (L) 01/15/2023   AST 40 (H) 01/20/2023   ALT 23 01/20/2023   ALKPHOS 78 01/15/2023   BILITOT 0.8 01/20/2023   GFRNONAA >60 01/15/2023   GFRAA 49 (L) 11/20/2020    Lab Results  Component Value Date   CEA1 1.9 06/21/2021    Medications: I have reviewed the patient's current medications.   Assessment/Plan: Hepatocellular carcinoma CT angio chest aorta 01/30/2021-no evidence of thoracic aortic aneurysm, diffuse hepatic steatosis, suggestion of early cirrhosis, 7.1 cm enhancing masslike area in segment 5/6, prominent  gastrohepatic and periportal nodes measuring up to 7 mm MRI liver 03/22/2021-arterial hyperenhancing subcapsular mass, segment 6, 7 x 5.1 cm, LI-RADS 5, no pathologically large lymph nodes Ultrasound-guided biopsy 04/24/2021-hepatocellular carcinoma Y90 radioembolization of segment 6 and segment 7 hepatic arterial branches 06/21/2021 MRI abdomen 09/03/2021-new increased enhancement inferior and superior to the dominant right liver lesion, new 11 mm enhancing lesion in the left liver LIRADS 3, dominant right hepatic lesion stable in size MRI abdomen 12/19/2021-unchanged hyperenhancing mass in the anterior inferior right liver measuring 7.8 x 5 cm, interval enlargement of a arterial enhancing lesion in segment 2- LIRADS 5, new small enhancing lesions hepatic segment 4A measuring 1.2 x 0.9 cm and 0.8 x 0.6 cm- LIRADS 5 Cycle 1 atezolizumab /bevacizumab 02/07/2022 Cycle 2 atezolizumab/bevacizumab 02/27/2022 Cycle 3 atezolizumab/bevacizumab 03/21/2022 Cycle 4 atezolizumab/bevacizumab 04/22/2022 MRI abdomen 05/05/2022-no change in dominant segment 6 lesion, no change in hyperenhancing lesions in segment 4A and segment 2, no new lesions Cycle 5 atezolizumab/bevacizumab 05/13/2022 Cycle 6 atezolizumab/bevacizumab 06/04/2022 Cycle 7 atezolizumab/bevacizumab 06/24/2022 Cycle 8 atezolizumab/bevacizumab 07/15/2022 MRI abdomen 07/20/2022-mild progression of bilateral liver lesions, no evidence of extrahepatic metastatic disease MRI abdomen [facility] 08/19/2022-increased size of the dominant admitted 5/6 lesion, now measuring 12.7 x 8.8 cm, multiple additional hepatic lesions appear larger than on the previous exam, stable mildly enlarged upper abdominal lymph nodes Lenvatinib 09/09/2022, stopped 10/04/2022 Cabozantinib 10/11/2022 MRI abdomen 12/27/2022-technical limitation on the 07/20/2022  exam makes direct comparison difficult, notes significant change in the enhancing liver lesions Cabozantinib placed on hold 12/31/2022 Ulcerative  colitis-maintained on vedolizumab Peripheral arterial disease History of a thoracic aortic aneurysm CAD Diabetes Hypertension Chronic renal failure Paroxysmal atrial fibrillation Admission 01/02/2023 with increased diarrhea-sigmoidoscopy with a single ulcer at the dentate line and an anal fissure, sigmoid colon unremarkable.  Negative enteric pathogen panel    Disposition: Matthew Chen has hepatocellular carcinoma.  He is currently continued off of systemic therapy since cabozantinib was discontinued 12/31/2022.  He has persistent diarrhea, potentially related to cabozantinib.  He will continue Imodium/Lomotil.  He will begin a trial of tincture of opium when this is approved by his insurance company.  I will present his case at the GI tumor conference on 01/29/2023.  We will discuss options for hepatic directed therapy.  Matthew Chen reports he does not wish to consider systemic treatments which may have significant toxicity. Lab visit with Dr. Tanya Nones on He will return for an office visit on 01/29/2023.  He is scheduled for a lab visit with Dr. Tanya Nones on 01/28/2023. We can consider adjusting his blood pressure regimen if the blood pressure remains low. Thornton Papas, MD  01/22/2023  1:23 PM

## 2023-01-23 ENCOUNTER — Telehealth: Payer: Self-pay | Admitting: *Deleted

## 2023-01-23 ENCOUNTER — Ambulatory Visit (INDEPENDENT_AMBULATORY_CARE_PROVIDER_SITE_OTHER): Payer: PPO

## 2023-01-23 ENCOUNTER — Telehealth: Payer: Self-pay

## 2023-01-23 VITALS — Ht 70.0 in | Wt 209.0 lb

## 2023-01-23 DIAGNOSIS — Z0000 Encounter for general adult medical examination without abnormal findings: Secondary | ICD-10-CM | POA: Diagnosis not present

## 2023-01-23 NOTE — Telephone Encounter (Signed)
Dr Stark please advise  

## 2023-01-23 NOTE — Patient Instructions (Signed)
Matthew Chen , Thank you for taking time to come for your Medicare Wellness Visit. I appreciate your ongoing commitment to your health goals. Please review the following plan we discussed and let me know if I can assist you in the future.   These are the goals we discussed:  Goals      Remain active     Attend granddaughter's wedding in the fall         This is a list of the screening recommended for you and due dates:  Health Maintenance  Topic Date Due   COVID-19 Vaccine (1) 02/05/2023*   Zoster (Shingles) Vaccine (1 of 2) 04/22/2023*   Flu Shot  03/13/2023   Hemoglobin A1C  05/08/2023   Complete foot exam   05/31/2023   Eye exam for diabetics  07/26/2023   Yearly kidney health urinalysis for diabetes  11/05/2023   Yearly kidney function blood test for diabetes  01/22/2024   Medicare Annual Wellness Visit  01/23/2024   Colon Cancer Screening  07/11/2024   Pneumonia Vaccine  Completed   Hepatitis C Screening  Completed   HPV Vaccine  Aged Out   DTaP/Tdap/Td vaccine  Discontinued  *Topic was postponed. The date shown is not the original due date.    Advanced directives: Information on Advanced Care Planning can be found at [facility] Advance Health Care Directives Advance Health Care Directives ([email or web address] Please bring a copy of your health care power of attorney and living will to the office to be added to your chart at your convenience.  Conditions/risks identified: Aim for 30 minutes of exercise or brisk walking, 6-8 glasses of water, and 5 servings of fruits and vegetables each day.  Next appointment: Follow up in one year for your annual wellness visit.   Preventive Care 67 Years and Older, Male  Preventive care refers to lifestyle choices and visits with your health care provider that can promote health and wellness. What does preventive care include? A yearly physical exam. This is also called an annual well check. Dental exams once or twice a  year. Routine eye exams. Ask your health care provider how often you should have your eyes checked. Personal lifestyle choices, including: Daily care of your teeth and gums. Regular physical activity. Eating a healthy diet. Avoiding tobacco and drug use. Limiting alcohol use. Practicing safe sex. Taking low doses of aspirin every day. Taking vitamin and mineral supplements as recommended by your health care provider. What happens during an annual well check? The services and screenings done by your health care provider during your annual well check will depend on your age, overall health, lifestyle risk factors, and family history of disease. Counseling  Your health care provider may ask you questions about your: Alcohol use. Tobacco use. Drug use. Emotional well-being. Home and relationship well-being. Sexual activity. Eating habits. History of falls. Memory and ability to understand (cognition). Work and work Astronomer. Screening  You may have the following tests or measurements: Height, weight, and BMI. Blood pressure. Lipid and cholesterol levels. These may be checked every 5 years, or more frequently if you are over 26 years old. Skin check. Lung cancer screening. You may have this screening every year starting at age 60 if you have a 30-pack-year history of smoking and currently smoke or have quit within the past 15 years. Fecal occult blood test (FOBT) of the stool. You may have this test every year starting at age 33. Flexible sigmoidoscopy or colonoscopy. You  may have a sigmoidoscopy every 5 years or a colonoscopy every 10 years starting at age 80. Prostate cancer screening. Recommendations will vary depending on your family history and other risks. Hepatitis C blood test. Hepatitis B blood test. Sexually transmitted disease (STD) testing. Diabetes screening. This is done by checking your blood sugar (glucose) after you have not eaten for a while (fasting). You may  have this done every 1-3 years. Abdominal aortic aneurysm (AAA) screening. You may need this if you are a current or former smoker. Osteoporosis. You may be screened starting at age 74 if you are at high risk. Talk with your health care provider about your test results, treatment options, and if necessary, the need for more tests. Vaccines  Your health care provider may recommend certain vaccines, such as: Influenza vaccine. This is recommended every year. Tetanus, diphtheria, and acellular pertussis (Tdap, Td) vaccine. You may need a Td booster every 10 years. Zoster vaccine. You may need this after age 72. Pneumococcal 13-valent conjugate (PCV13) vaccine. One dose is recommended after age 67. Pneumococcal polysaccharide (PPSV23) vaccine. One dose is recommended after age 68. Talk to your health care provider about which screenings and vaccines you need and how often you need them. This information is not intended to replace advice given to you by your health care provider. Make sure you discuss any questions you have with your health care provider. Document Released: 08/25/2015 Document Revised: 04/17/2016 Document Reviewed: 05/30/2015 Elsevier Interactive Patient Education  2017 [employer].  Fall Prevention in the Home Falls can cause injuries. They can happen to people of all ages. There are many things you can do to make your home safe and to help prevent falls. What can I do on the outside of my home? Regularly fix the edges of walkways and driveways and fix any cracks. Remove anything that might make you trip as you walk through a door, such as a raised step or threshold. Trim any bushes or trees on the path to your home. Use bright outdoor lighting. Clear any walking paths of anything that might make someone trip, such as rocks or tools. Regularly check to see if handrails are loose or broken. Make sure that both sides of any steps have handrails. Any raised decks and porches  should have guardrails on the edges. Have any leaves, snow, or ice cleared regularly. Use sand or salt on walking paths during winter. Clean up any spills in your garage right away. This includes oil or grease spills. What can I do in the bathroom? Use night lights. Install grab bars by the toilet and in the tub and shower. Do not use towel bars as grab bars. Use non-skid mats or decals in the tub or shower. If you need to sit down in the shower, use a plastic, non-slip stool. Keep the floor dry. Clean up any water that spills on the floor as soon as it happens. Remove soap buildup in the tub or shower regularly. Attach bath mats securely with double-sided non-slip rug tape. Do not have throw rugs and other things on the floor that can make you trip. What can I do in the bedroom? Use night lights. Make sure that you have a light by your bed that is easy to reach. Do not use any sheets or blankets that are too big for your bed. They should not hang down onto the floor. Have a firm chair that has side arms. You can use this for support while you get  dressed. Do not have throw rugs and other things on the floor that can make you trip. What can I do in the kitchen? Clean up any spills right away. Avoid walking on wet floors. Keep items that you use a lot in easy-to-reach places. If you need to reach something above you, use a strong step stool that has a grab bar. Keep electrical cords out of the way. Do not use floor polish or wax that makes floors slippery. If you must use wax, use non-skid floor wax. Do not have throw rugs and other things on the floor that can make you trip. What can I do with my stairs? Do not leave any items on the stairs. Make sure that there are handrails on both sides of the stairs and use them. Fix handrails that are broken or loose. Make sure that handrails are as long as the stairways. Check any carpeting to make sure that it is firmly attached to the stairs.  Fix any carpet that is loose or worn. Avoid having throw rugs at the top or bottom of the stairs. If you do have throw rugs, attach them to the floor with carpet tape. Make sure that you have a light switch at the top of the stairs and the bottom of the stairs. If you do not have them, ask someone to add them for you. What else can I do to help prevent falls? Wear shoes that: Do not have high heels. Have rubber bottoms. Are comfortable and fit you well. Are closed at the toe. Do not wear sandals. If you use a stepladder: Make sure that it is fully opened. Do not climb a closed stepladder. Make sure that both sides of the stepladder are locked into place. Ask someone to hold it for you, if possible. Clearly mark and make sure that you can see: Any grab bars or handrails. First and last steps. Where the edge of each step is. Use tools that help you move around (mobility aids) if they are needed. These include: Canes. Walkers. Scooters. Crutches. Turn on the lights when you go into a dark area. Replace any light bulbs as soon as they burn out. Set up your furniture so you have a clear path. Avoid moving your furniture around. If any of your floors are uneven, fix them. If there are any pets around you, be aware of where they are. Review your medicines with your doctor. Some medicines can make you feel dizzy. This can increase your chance of falling. Ask your doctor what other things that you can do to help prevent falls. This information is not intended to replace advice given to you by your health care provider. Make sure you discuss any questions you have with your health care provider. Document Released: 05/25/2009 Document Revised: 01/04/2016 Document Reviewed: 09/02/2014 Elsevier Interactive Patient Education  2017 [employer].

## 2023-01-23 NOTE — Telephone Encounter (Signed)
Increase Colestipol to 2g po bid. If not effective then increase to 3 g po bid.

## 2023-01-23 NOTE — Progress Notes (Signed)
Subjective:   Matthew Chen is a 67 y.o. male who presents for Medicare Annual/Subsequent preventive examination.  I connected with  Matthew Chen on 01/23/23 by a audio enabled telemedicine application and verified that I am speaking with the correct person using two identifiers.  Patient Location: Home  Provider Location: Home Office  I discussed the limitations of evaluation and management by telemedicine. The patient expressed understanding and agreed to proceed.  Review of Systems     Cardiac Risk Factors include: advanced age (>81men, >30 women);diabetes mellitus;dyslipidemia;hypertension;male gender     Objective:    Today's Vitals   01/23/23 1316  Weight: 209 lb (94.8 kg)  Height: 5\' 10"  (1.778 m)   Body mass index is 29.99 kg/m.     01/23/2023    1:20 PM 01/11/2023    5:49 PM 01/03/2023    4:06 PM 01/03/2023    4:27 AM 01/02/2023    6:38 PM 12/31/2022    9:53 AM 12/09/2022   11:14 AM  Advanced Directives  Does Patient Have a Medical Advance Directive? No No No  No No No  Type of Careers adviser;Living will Living will;Healthcare Power of Attorney  Does patient want to make changes to medical advance directive?      No - Patient declined No - Patient declined  Copy of Healthcare Power of Attorney in Chart?      No - copy requested   Would patient like information on creating a medical advance directive? Yes (MAU/Ambulatory/Procedural Areas - Information given)  No - Patient declined No - Patient declined   No - Patient declined    Current Medications (verified) Outpatient Encounter Medications as of 01/23/2023  Medication Sig   acetaminophen (TYLENOL) 500 MG tablet Take 1 tablet (500 mg total) by mouth every 6 (six) hours as needed. (Patient taking differently: Take 500 mg by mouth every 6 (six) hours as needed for moderate pain.)   AMBULATORY NON FORMULARY MEDICATION Diltiazem 2%/Lidocaine 2% apply a pea sized amount PR TID    amiodarone (PACERONE) 200 MG tablet Take 1 tablet (200 mg total) by mouth daily.   apixaban (ELIQUIS) 5 MG TABS tablet Take 1 tablet (5 mg total) by mouth 2 (two) times daily.   aspirin EC 81 MG tablet Take 1 tablet (81 mg total) by mouth daily.   atorvastatin (LIPITOR) 80 MG tablet Take 1 tablet (80 mg total) by mouth daily.   carvedilol (COREG) 12.5 MG tablet Take 1 tablet (12.5 mg total) by mouth 2 (two) times daily with a meal.   colestipol (COLESTID) 1 g tablet Take 1 tablet (1 g total) by mouth 2 (two) times daily.   cyanocobalamin (VITAMIN B12) 1000 MCG tablet Take 1,000 mcg by mouth daily.   diphenoxylate-atropine (LOMOTIL) 2.5-0.025 MG tablet Take 1-2 tablets by mouth 4 (four) times daily. (Patient taking differently: Take 1-2 tablets by mouth 4 (four) times daily. Pt states he is taking 8 pills per day.)   empagliflozin (JARDIANCE) 10 MG TABS tablet Take 1 tablet (10 mg total) by mouth daily.   fenofibrate 160 MG tablet Take 1 tablet (160 mg total) by mouth daily.   furosemide (LASIX) 40 MG tablet Take 1 tablet (40 mg total) by mouth daily as needed for edema.   glucose blood (ONETOUCH ULTRA) test strip CHECK BLOOD SUGAR TWICE A DAY   Insulin Pen Needle (B-D UF III MINI PEN NEEDLES) 31G X 5 MM MISC Use  daily with Victoza   Iron, Ferrous Sulfate, 325 (65 Fe) MG TABS Take 325 mg by mouth daily.   lisinopril (ZESTRIL) 20 MG tablet Take 1 tablet (20 mg total) by mouth daily.   Omega-3 Fatty Acids (FISH OIL) 1000 MG CAPS Take 1,000 mg by mouth 2 (two) times daily.   Opium 10 MG/ML (1%) TINC Take 0.6 mLs (6 mg total) by mouth every 6 (six) hours as needed for diarrhea or loose stools.   oxyCODONE (ROXICODONE) 5 MG immediate release tablet Take 1 tablet (5 mg total) by mouth every 4 (four) hours as needed for severe pain.   pantoprazole (PROTONIX) 40 MG tablet Take 1 tablet (40 mg total) by mouth 2 (two) times daily.   pioglitazone (ACTOS) 30 MG tablet TAKE 1 TABLET BY MOUTH EVERY DAY.  STRENGTH: 30 MG (Patient taking differently: Take 30 mg by mouth daily.)   sildenafil (VIAGRA) 100 MG tablet Take 0.5-1 tablets (50-100 mg total) by mouth daily as needed for erectile dysfunction.   sitaGLIPtin (JANUVIA) 100 MG tablet Take 1 tablet (100 mg total) by mouth daily.   vedolizumab (ENTYVIO) 300 MG injection Inject 300 mg as directed See admin instructions. Every 2 months   diltiazem (CARDIZEM) 30 MG tablet Take 1 tablet (30 mg total) by mouth 3 (three) times daily as needed (palpitations). (Patient not taking: Reported on 01/23/2023)   No facility-administered encounter medications on file as of 01/23/2023.    Allergies (verified) Penicillins   History: Past Medical History:  Diagnosis Date   Adenomatous colon polyp 03/2008   Anginal pain (HCC)    Aortic aneurysm, thoracic (HCC) 09/09/2016   4.4 cm by echo 05/2015   Arthritis    "joints in my fingers ache" (07/13/2015)   Bicuspid aortic valve 09/09/2016   Cataract 2019   bilateral eyes   CKD (chronic kidney disease), stage III (HCC)    Coronary artery disease    Diabetes mellitus without complication (HCC)    Hemorrhoids    Hepatocellular carcinoma (HCC)    Hyperlipidemia    Hypertension    Paroxysmal atrial fibrillation (HCC)    Peripheral arterial disease (HCC)    a. dopppler 05/29/2015 revealed high-grade right popliteal and distal left SFA disease b. LE angio 06/28/2015 revealing high grade calcific dx with distal L SFA and R popliteal artery s/p diamondback orbital rotational atherectomy, PTA of L SFA  c. 07/13/2015 diamondback orbital rotational atherectomy and drug eluting balloon angioplasty of R popliteal artery (P2 segment) using distal protection    Type II diabetes mellitus (HCC)    Ulcerative colitis    Past Surgical History:  Procedure Laterality Date   ABDOMINAL AORTOGRAM W/LOWER EXTREMITY N/A 04/27/2018   Procedure: ABDOMINAL AORTOGRAM W/LOWER EXTREMITY;  Surgeon: Runell Gess, MD;  Location: [facility]  INVASIVE CV LAB;  Service: Cardiovascular;  Laterality: N/A;   BIOPSY  01/03/2023   Procedure: BIOPSY;  Surgeon: Benancio Deeds, MD;  Location: [facility] ENDOSCOPY;  Service: Gastroenterology;;   COLONOSCOPY     COLONOSCOPY W/ POLYPECTOMY  X 4-5   CORONARY ATHERECTOMY N/A 02/15/2021   Procedure: CORONARY ATHERECTOMY;  Surgeon: Runell Gess, MD;  Location: [facility] INVASIVE CV LAB;  Service: Cardiovascular;  Laterality: N/A;  aborted    CORONARY BALLOON ANGIOPLASTY N/A 02/15/2021   Procedure: CORONARY BALLOON ANGIOPLASTY;  Surgeon: Runell Gess, MD;  Location: [facility] INVASIVE CV LAB;  Service: Cardiovascular;  Laterality: N/A;   COSMETIC SURGERY  < 2000   "removed birthmark from top of my  head"   FLEXIBLE SIGMOIDOSCOPY N/A 01/03/2023   Procedure: FLEXIBLE SIGMOIDOSCOPY;  Surgeon: Benancio Deeds, MD;  Location: [facility] ENDOSCOPY;  Service: Gastroenterology;  Laterality: N/A;   IR ANGIOGRAM SELECTIVE EACH ADDITIONAL VESSEL  06/08/2021   IR ANGIOGRAM SELECTIVE EACH ADDITIONAL VESSEL  06/08/2021   IR ANGIOGRAM SELECTIVE EACH ADDITIONAL VESSEL  06/08/2021   IR ANGIOGRAM SELECTIVE EACH ADDITIONAL VESSEL  06/08/2021   IR ANGIOGRAM SELECTIVE EACH ADDITIONAL VESSEL  06/21/2021   IR ANGIOGRAM SELECTIVE EACH ADDITIONAL VESSEL  06/21/2021   IR ANGIOGRAM SELECTIVE EACH ADDITIONAL VESSEL  06/21/2021   IR ANGIOGRAM SELECTIVE EACH ADDITIONAL VESSEL  06/21/2021   IR ANGIOGRAM VISCERAL SELECTIVE  06/08/2021   IR ANGIOGRAM VISCERAL SELECTIVE  06/21/2021   IR EMBO ARTERIAL NOT HEMORR HEMANG INC GUIDE ROADMAPPING  06/08/2021   IR EMBO TUMOR ORGAN ISCHEMIA INFARCT INC GUIDE ROADMAPPING  06/21/2021   IR EMBO TUMOR ORGAN ISCHEMIA INFARCT INC GUIDE ROADMAPPING  06/21/2021   IR RADIOLOGIST EVAL & MGMT  05/08/2021   IR RADIOLOGIST EVAL & MGMT  07/26/2021   IR RADIOLOGIST EVAL & MGMT  09/05/2021   IR RADIOLOGIST EVAL & MGMT  12/27/2021   IR US GUIDE VASC ACCESS LEFT  06/21/2021   IR US GUIDE VASC ACCESS RIGHT   06/08/2021   LEFT HEART CATH AND CORONARY ANGIOGRAPHY N/A 02/08/2021   Procedure: LEFT HEART CATH AND CORONARY ANGIOGRAPHY;  Surgeon: Runell Gess, MD;  Location: [facility] INVASIVE CV LAB;  Service: Cardiovascular;  Laterality: N/A;   PERIPHERAL VASCULAR ATHERECTOMY  04/27/2018   Procedure: PERIPHERAL VASCULAR ATHERECTOMY;  Surgeon: Runell Gess, MD;  [facility] INVAS[facility] CV LAB;  Service: Cardiovascular;;  right popliteal artery   PERIPHERAL VASCULAR CATHETERIZATION N/A 06/29/2015   Procedure: Lower Extremity Angiography;  Surgeon: Runell Gess, MD;  Location: [facility] INVASIVE CV LAB;  Service: Cardiovascular;  Laterality: N/A;   PERIPHERAL VASCULAR CATHETERIZATION N/A 07/13/2015   Procedure: Lower Extremity Angiography;  Surgeon: Runell Gess, MD;  Location: [facility] INVASIVE CV LAB;  Service: Cardiovascular;  Laterality: N/A;   PERIPHERAL VASCULAR CATHETERIZATION  07/13/2015   Procedure: Peripheral Vascular Atherectomy;  Surgeon: Runell Gess, MD;  Location: [facility] INVASIVE CV LAB;  Service: Cardiovascular;;   PERIPHERAL VASCULAR INTERVENTION  04/27/2018   Procedure: PERIPHERAL VASCULAR INTERVENTION;  Surgeon: Runell Gess, MD;  Location: [facility] INVASIVE CV LAB;  Service: Cardiovascular;;  right popliteal artery   POLYPECTOMY     TONSILLECTOMY     UPPER GASTROINTESTINAL ENDOSCOPY     Family History  Problem Relation Age of Onset   Colon cancer Maternal Grandmother        in her 35's   Colon polyps Mother        in her 58's   Diabetes Mother    Heart disease Father    Diabetes Maternal Aunt        Aunts and Uncles   Esophageal cancer Neg Hx    Stomach cancer Neg Hx    Rectal cancer Neg Hx    Social History   Socioeconomic History   Marital status: Married    Spouse name: Corday Valek   Number of children: 2   Years of education: Not on file   Highest education level: Not on file  Occupational History   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, building services   Tobacco Use   Smoking status: Former    Packs/day: 1.50    Years: 25.00    Additional pack years: 0.00  Total pack years: 37.50    Types: Cigarettes    Quit date: 08/12/1996    Years since quitting: 26.4   Smokeless tobacco: Never   Tobacco comments:    Former smoker 09/17/2022  Vaping Use   Vaping Use: Never used  Substance and Sexual Activity   Alcohol use: Yes    Alcohol/week: 1.0 standard drink of alcohol    Types: 1 Cans of beer per week    Comment: social use   Drug use: No   Sexual activity: Yes  Other Topics Concern   Not on file  Social History Narrative   ** Merged History Encounter **       Social Determinants of Health   Financial Resource Strain: Low Risk  (01/23/2023)   Overall Financial Resource Strain (CARDIA)    Difficulty of Paying Living Expenses: Not hard at all  Food Insecurity: No Food Insecurity (01/23/2023)   Hunger Vital Sign    Worried About Running Out of Food in the Last Year: Never true    Ran Out of Food in the Last Year: Never true  Transportation Needs: No Transportation Needs (01/23/2023)   PRAPARE - Administrator, Civil Service (Medical): No    Lack of Transportation (Non-Medical): No  Physical Activity: Insufficiently Active (01/23/2023)   Exercise Vital Sign    Days of Exercise per Week: 3 days    Minutes of Exercise per Session: 30 min  Stress: No Stress Concern Present (01/23/2023)   [employer] of Occupational Health - Occupational Stress Questionnaire    Feeling of Stress : Only a little  Social Connections: Socially Integrated (01/23/2023)   Social Connection and Isolation Panel [NHANES]    Frequency of Communication with Friends and Family: More than three times a week    Frequency of Social Gatherings with Friends and Family: Three times a week    Attends Religious Services: More than 4 times per year    Active Member of Clubs or Organizations: Yes    Attends Banker Meetings: 1 to 4 times per  year    Marital Status: Married    Tobacco Counseling Counseling given: Not Answered Tobacco comments: Former smoker 09/17/2022   Clinical Intake:  Pre-visit preparation completed: Yes  Pain : No/denies pain  Diabetes: Yes CBG done?: No Did pt. bring in CBG monitor from home?: No  How often do you need to have someone help you when you read instructions, pamphlets, or other written materials from your doctor or pharmacy?: 1 - Never  Diabetic?Yes   Nutrition Risk Assessment:  Has the patient had any N/V/D within the last 2 months?  Yes  Does the patient have any non-healing wounds?  No  Has the patient had any unintentional weight loss or weight gain?  No   Diabetes:  Is the patient diabetic?  Yes  If diabetic, was a CBG obtained today?  No  Did the patient bring in their glucometer from home?  No  How often do you monitor your CBG's? Daily and as needed.   Financial Strains and Diabetes Management:  Are you having any financial strains with the device, your supplies or your medication? No .  Does the patient want to be seen by Chronic Care Management for management of their diabetes?  No  Would the patient like to be referred to a Nutritionist or for Diabetic Management?  No   Diabetic Exams:  Diabetic Eye Exam: Completed 07/25/22 Diabetic Foot Exam: Completed 05/30/22  Interpreter Needed?: No  Information entered by :: Kandis Fantasia LPN   Activities of Daily Living    01/23/2023    1:20 PM 01/03/2023    3:33 AM  In your present state of health, do you have any difficulty performing the following activities:  Hearing? 0 0  Vision? 0 0  Difficulty concentrating or making decisions? 0 0  Walking or climbing stairs? 1 1  Dressing or bathing? 0 0  Doing errands, shopping? 0 0  Preparing Food and eating ? N   Using the Toilet? N   In the past six months, have you accidently leaked urine? N   Do you have problems with loss of bowel control? Y   Managing  your Medications? N   Managing your Finances? N   Housekeeping or managing your Housekeeping? N     Patient Care Team: Donita Brooks, MD as PCP - General (Family Medicine) Runell Gess, MD as PCP - Cardiology (Cardiology) Regan Lemming, MD as PCP - Electrophysiology (Cardiology) Donita Brooks, MD (Family Medicine) Chilton Si, MD as Attending Physician (Cardiology) Runell Gess, MD as Consulting Physician (Cardiology) Erroll Luna, [facility] as Pharmacist (Pharmacist) [name], MD as Consulting Physician (Oncology) Meryl Dare, MD as Consulting Physician (Gastroenterology)  Indicate any recent Medical Services you may have received from other than [facility] providers in the past year (date may be approximate).     Assessment:   This is a routine wellness examination for Semih.  Hearing/Vision screen Hearing Screening - Comments:: Denies hearing difficulties   Vision Screening - Comments:: up to date with routine eye exams with [facility]   Dietary issues and exercise activities discussed: Current Exercise Habits: Home exercise routine, Type of exercise: walking, Time (Minutes): 30, Frequency (Times/Week): 3, Weekly Exercise (Minutes/Week): 90, Intensity: Mild   Goals Addressed             This Visit's Progress    Remain active       Attend granddaughter's wedding in the fall      COMPLETED: Set My Target A1C-Diabetes Type 2       Timeframe:  Long-Range Goal Priority:  High Start Date:     08/17/21                        Expected End Date:   02/14/22                    Follow Up Date 11/15/21    - set target A1C    Why is this important?   Your target A1C is decided together by you and your doctor.  It is based on several things like your age and other health issues.    Notes:  A1c < 7      Depression Screen    01/23/2023    1:19 PM 01/20/2023    4:13 PM 11/12/2022    2:00 PM 05/30/2022    8:36 AM 11/20/2020     8:17 AM 04/25/2020   11:26 AM 05/03/2019   10:21 AM  PHQ 2/9 Scores  PHQ - 2 Score 0 0 0 0 0 0 0    Fall Risk    01/23/2023    1:18 PM 01/20/2023    4:13 PM 11/12/2022    2:00 PM 05/30/2022    8:36 AM 11/20/2020    8:17 AM  Fall Risk   Falls in the past year?  1 1 0 0 0  Number falls in past yr: 1 1 0 0   Injury with Fall? 1 1 0 0   Risk for fall due to : History of fall(s);Impaired balance/gait;Impaired mobility History of fall(s);Impaired balance/gait;Impaired mobility;Medication side effect No Fall Risks No Fall Risks No Fall Risks  Follow up Education provided;Falls prevention discussed;Falls evaluation completed Education provided;Falls prevention discussed Falls prevention discussed Falls prevention discussed Falls evaluation completed    FALL RISK PREVENTION PERTAINING TO THE HOME:  Any stairs in or around the home? Yes  If so, are there any without handrails? No  Home free of loose throw rugs in walkways, pet beds, electrical cords, etc? Yes  Adequate lighting in your home to reduce risk of falls? Yes   ASSISTIVE DEVICES UTILIZED TO PREVENT FALLS:  Life alert? No  Use of a cane, walker or w/c? No  Grab bars in the bathroom? Yes  Shower chair or bench in shower? No  Elevated toilet seat or a handicapped toilet? Yes   TIMED UP AND GO:  Was the test performed? No . Telephonic visit    Cognitive Function:        01/23/2023    1:21 PM  6CIT Screen  What Year? 0 points  What month? 0 points  What time? 0 points  Count back from 20 0 points  Months in reverse 0 points  Repeat phrase 0 points  Total Score 0 points    Immunizations Immunization History  Administered Date(s) Administered   Fluad Quad(high Dose 65+) 06/04/2022   Influenza Inj Mdck Quad Pf 06/14/2017   Influenza, High Dose Seasonal PF 05/22/2021   Influenza,inj,Quad PF,6+ Mos 07/22/2013, 06/12/2015, 06/10/2016, 04/21/2018, 05/03/2019   Influenza-Unspecified 06/14/2017, 04/21/2018   PNEUMOCOCCAL  CONJUGATE-20 05/30/2022   Pneumococcal Polysaccharide-23 06/23/2017    TDAP status: Due, Education has been provided regarding the importance of this vaccine. Advised may receive this vaccine at local pharmacy or Health Dept. Aware to provide a copy of the vaccination record if obtained from local pharmacy or Health Dept. Verbalized acceptance and understanding.  Pneumococcal vaccine status: Up to date  Covid-19 vaccine status: Information provided on how to obtain vaccines.   Qualifies for Shingles Vaccine? Yes   Zostavax completed No   Shingrix Completed?: No.    Education has been provided regarding the importance of this vaccine. Patient has been advised to call insurance company to determine out of pocket expense if they have not yet received this vaccine. Advised may also receive vaccine at local pharmacy or Health Dept. Verbalized acceptance and understanding.  Screening Tests Health Maintenance  Topic Date Due   COVID-19 Vaccine (1) 02/05/2023 (Originally 08/13/1960)   Zoster Vaccines- Shingrix (1 of 2) 04/22/2023 (Originally 08/13/1974)   INFLUENZA VACCINE  03/13/2023   HEMOGLOBIN A1C  05/08/2023   FOOT EXAM  05/31/2023   OPHTHALMOLOGY EXAM  07/26/2023   Diabetic kidney evaluation - Urine ACR  11/05/2023   Diabetic kidney evaluation - eGFR measurement  01/22/2024   Medicare Annual Wellness (AWV)  01/23/2024   Colonoscopy  07/11/2024   Pneumonia Vaccine 89+ Years old  Completed   Hepatitis C Screening  Completed   HPV VACCINES  Aged Out   DTaP/Tdap/Td  Discontinued    Health Maintenance  There are no preventive care reminders to display for this patient.  Colorectal cancer screening: Type of screening: Colonoscopy. Completed 07/11/21. Repeat every 3 years  Lung Cancer Screening: (Low Dose CT Chest recommended if Age 47-80 years, 30 pack-year  currently smoking OR have quit w/in 15years.) does not qualify.   Lung Cancer Screening Referral: n/a  Additional  Screening:  Hepatitis C Screening: does qualify; Completed 02/05/21  Vision Screening: Recommended annual ophthalmology exams for early detection of glaucoma and other disorders of the eye. Is the patient up to date with their annual eye exam?  Yes  Who is the provider or what is the name of the office in which the patient attends annual eye exams? [facility] If pt is not established with a provider, would they like to be referred to a provider to establish care? No .   Dental Screening: Recommended annual dental exams for proper oral hygiene  Community Resource Referral / Chronic Care Management: CRR required this visit?  No   CCM required this visit?  No      Plan:     I have personally reviewed and noted the following in the patient's chart:   Medical and social history Use of alcohol, tobacco or illicit drugs  Current medications and supplements including opioid prescriptions. Patient is currently taking opioid prescriptions. Information provided to patient regarding non-opioid alternatives. Patient advised to discuss non-opioid treatment plan with their provider. Functional ability and status Nutritional status Physical activity Advanced directives List of other physicians Hospitalizations, surgeries, and ER visits in previous 12 months Vitals Screenings to include cognitive, depression, and falls Referrals and appointments  In addition, I have reviewed and discussed with patient certain preventive protocols, quality metrics, and best practice recommendations. A written personalized care plan for preventive services as well as general preventive health recommendations were provided to patient.     Durwin Nora, [place]   1/61/0960   Due to this being a virtual visit, the after visit summary with patients personalized plan was offered to patient via mail or my-chart. Patient would like to access on my-chart  Nurse Notes: No concerns

## 2023-01-23 NOTE — Telephone Encounter (Signed)
-----   Message from Lucky Cowboy, RN sent at 01/23/2023  8:41 AM EDT ----- Regarding: RE: Diarrhea Hi Darl Pikes, I have forward this to [place], RN she is now working with Dr. Russella Dar.  ----- Message ----- From: Wandalee Ferdinand, RN Sent: 01/22/2023   2:23 PM EDT To: Lucky Cowboy, RN Subject: Diarrhea                                       Matthew Chen, Matthew Chen is a patient we share and he is having a lot of diarrhea despite Lomotil #8 tabs/day. We have ordered tincture of opium, but insurance has not approved it. He sees Dr. Russella Dar on 8/12. In the meantime, does your group have any suggestions for [place] to try for him? Thank you, Susan,RN

## 2023-01-23 NOTE — Telephone Encounter (Signed)
Wandalee Ferdinand, RN  Chnupa, Perlie Gold, RN; Loretha Stapler, RN Yes, I will be glad to call him. Thank you, Darl Pikes

## 2023-01-23 NOTE — Telephone Encounter (Signed)
Dr. Russella Dar has recommended for his diarrhea to increase the colestipol to 2 grams bid and if not effective, increase to 3 grams bid.  Left this information on his voice mail and also sent Mychart message. Received notification via CoverMyMeds that the tincture of opium was denied. Will begin appeal process.

## 2023-01-23 NOTE — Telephone Encounter (Signed)
Darl Pikes see Dr Ardell Isaacs req.  Are you calling the pt?

## 2023-01-24 ENCOUNTER — Encounter: Payer: Self-pay | Admitting: *Deleted

## 2023-01-24 NOTE — Progress Notes (Signed)
Received fax from [facility] [employer] that appeal for opium tincture was denied by Medicare plan. Was provided form to complete for independent review with [employer]. Form completed and last office note faxed to [phone].

## 2023-01-26 ENCOUNTER — Other Ambulatory Visit: Payer: Self-pay | Admitting: Physician Assistant

## 2023-01-27 ENCOUNTER — Other Ambulatory Visit: Payer: Self-pay

## 2023-01-27 ENCOUNTER — Other Ambulatory Visit: Payer: Self-pay | Admitting: Student

## 2023-01-27 ENCOUNTER — Other Ambulatory Visit (HOSPITAL_COMMUNITY): Payer: Self-pay

## 2023-01-27 ENCOUNTER — Telehealth: Payer: Self-pay

## 2023-01-27 ENCOUNTER — Other Ambulatory Visit: Payer: Self-pay | Admitting: Family Medicine

## 2023-01-27 ENCOUNTER — Other Ambulatory Visit: Payer: Self-pay | Admitting: Nurse Practitioner

## 2023-01-27 DIAGNOSIS — I10 Essential (primary) hypertension: Secondary | ICD-10-CM

## 2023-01-27 DIAGNOSIS — E1169 Type 2 diabetes mellitus with other specified complication: Secondary | ICD-10-CM

## 2023-01-27 DIAGNOSIS — I4819 Other persistent atrial fibrillation: Secondary | ICD-10-CM

## 2023-01-27 MED ORDER — CARVEDILOL 12.5 MG PO TABS
12.5000 mg | ORAL_TABLET | Freq: Two times a day (BID) | ORAL | 1 refills | Status: DC
Start: 2023-01-27 — End: 2023-04-11

## 2023-01-27 NOTE — Telephone Encounter (Signed)
Prescription Request  01/27/2023  LOV: [id number]  What is the name of the medication or equipment? carvedilol (COREG) 12.5 MG tablet [[id number]]  Have you contacted your pharmacy to request a refill? No   Which pharmacy would you like this sent to?  [id number] [place], [place] - [address] [place] [address] Phone: [phone] Fax: [phone]    Patient notified that their request is being sent to the clinical staff for review and that they should receive a response within 2 business days.   Please advise at [facility] [phone]

## 2023-01-28 ENCOUNTER — Other Ambulatory Visit: Payer: PPO

## 2023-01-28 DIAGNOSIS — D649 Anemia, unspecified: Secondary | ICD-10-CM

## 2023-01-28 DIAGNOSIS — D689 Coagulation defect, unspecified: Secondary | ICD-10-CM

## 2023-01-28 DIAGNOSIS — C220 Liver cell carcinoma: Secondary | ICD-10-CM | POA: Diagnosis not present

## 2023-01-28 LAB — CBC WITH DIFFERENTIAL/PLATELET
Absolute Monocytes: 1225 cells/uL — ABNORMAL HIGH (ref 200–950)
Basophils Absolute: 47 cells/uL (ref 0–200)
Basophils Relative: 0.6 %
Eosinophils Absolute: 103 cells/uL (ref 15–500)
Eosinophils Relative: 1.3 %
HCT: 27 % — ABNORMAL LOW (ref 38.5–50.0)
Hemoglobin: 8.4 g/dL — ABNORMAL LOW (ref 13.2–17.1)
Lymphs Abs: 1422 cells/uL (ref 850–3900)
MCH: 30.2 pg (ref 27.0–33.0)
MCHC: 31.1 g/dL — ABNORMAL LOW (ref 32.0–36.0)
MCV: 97.1 fL (ref 80.0–100.0)
MPV: 11.2 fL (ref 7.5–12.5)
Monocytes Relative: 15.5 %
Neutro Abs: 5103 cells/uL (ref 1500–7800)
Neutrophils Relative %: 64.6 %
Platelets: 272 10*3/uL (ref 140–400)
RBC: 2.78 10*6/uL — ABNORMAL LOW (ref 4.20–5.80)
RDW: 19.7 % — ABNORMAL HIGH (ref 11.0–15.0)
Total Lymphocyte: 18 %
WBC: 7.9 10*3/uL (ref 3.8–10.8)

## 2023-01-28 MED ORDER — AMIODARONE HCL 200 MG PO TABS
200.0000 mg | ORAL_TABLET | Freq: Every day | ORAL | 3 refills | Status: DC
Start: 2023-01-28 — End: 2023-06-30

## 2023-01-28 NOTE — Progress Notes (Unsigned)
[facility] OFFICE PROGRESS NOTE   Diagnosis: Hepatocellular carcinoma  INTERVAL HISTORY:   [name] returns as scheduled.  He feels well.  Good appetite.  No dyspnea.  He has been active getting out of the house.  He continues to have intermittent diarrhea.  The diarrhea is generally improved.  He is taking Lomotil.  His insurance denied the tincture of opium.  He continues colestipol and iron.  He is not using Imodium.  No bleeding.   Objective:  Vital signs in last 24 hours:  Blood pressure 104/80, pulse 74, temperature 98.1 F (36.7 C), temperature source Oral, resp. rate 18, height 5\' 10"  (1.778 m), weight 204 lb 11.2 oz (92.9 kg), SpO2 100 %.   Resp: Lungs clear bilaterally Cardio: Regular rate and rhythm GI: No hepatomegaly, nontender, no mass Vascular: Pitting edema of the lower leg bilaterally  Lab Results:  Lab Results  Component Value Date   WBC 7.9 01/28/2023   HGB 8.4 (L) 01/28/2023   HCT 27.0 (L) 01/28/2023   MCV 97.1 01/28/2023   PLT 272 01/28/2023   NEUTROABS 5,103 01/28/2023    CMP  Lab Results  Component Value Date   NA 141 01/22/2023   K 3.5 01/22/2023   CL 106 01/22/2023   CO2 28 01/22/2023   GLUCOSE 164 (H) 01/22/2023   BUN 20 01/22/2023   CREATININE 1.23 01/22/2023   CALCIUM 8.3 (L) 01/22/2023   PROT 5.7 (L) 01/22/2023   ALBUMIN 3.0 (L) 01/22/2023   AST 44 (H) 01/22/2023   ALT 26 01/22/2023   ALKPHOS 89 01/22/2023   BILITOT 0.9 01/22/2023   GFRNONAA >60 01/22/2023   GFRAA 49 (L) 11/20/2020    Lab Results  Component Value Date   CEA1 1.9 06/21/2021      Medications: I have reviewed the patient's current medications.   Assessment/Plan: Hepatocellular carcinoma CT angio chest aorta 01/30/2021-no evidence of thoracic aortic aneurysm, diffuse hepatic steatosis, suggestion of early cirrhosis, 7.1 cm enhancing masslike area in segment 5/6, prominent gastrohepatic and periportal nodes measuring up to 7 mm MRI liver  03/22/2021-arterial hyperenhancing subcapsular mass, segment 6, 7 x 5.1 cm, LI-RADS 5, no pathologically large lymph nodes Ultrasound-guided biopsy 04/24/2021-hepatocellular carcinoma Y90 radioembolization of segment 6 and segment 7 hepatic arterial branches 06/21/2021 MRI abdomen 09/03/2021-new increased enhancement inferior and superior to the dominant right liver lesion, new 11 mm enhancing lesion in the left liver LIRADS 3, dominant right hepatic lesion stable in size MRI abdomen 12/19/2021-unchanged hyperenhancing mass in the anterior inferior right liver measuring 7.8 x 5 cm, interval enlargement of a arterial enhancing lesion in segment 2- LIRADS 5, new small enhancing lesions hepatic segment 4A measuring 1.2 x 0.9 cm and 0.8 x 0.6 cm- LIRADS 5 Cycle 1 atezolizumab /bevacizumab 02/07/2022 Cycle 2 atezolizumab/bevacizumab 02/27/2022 Cycle 3 atezolizumab/bevacizumab 03/21/2022 Cycle 4 atezolizumab/bevacizumab 04/22/2022 MRI abdomen 05/05/2022-no change in dominant segment 6 lesion, no change in hyperenhancing lesions in segment 4A and segment 2, no new lesions Cycle 5 atezolizumab/bevacizumab 05/13/2022 Cycle 6 atezolizumab/bevacizumab 06/04/2022 Cycle 7 atezolizumab/bevacizumab 06/24/2022 Cycle 8 atezolizumab/bevacizumab 07/15/2022 MRI abdomen 07/20/2022-mild progression of bilateral liver lesions, no evidence of extrahepatic metastatic disease MRI abdomen [facility] 08/19/2022-increased size of the dominant admitted 5/6 lesion, now measuring 12.7 x 8.8 cm, multiple additional hepatic lesions appear larger than on the previous exam, stable mildly enlarged upper abdominal lymph nodes Lenvatinib 09/09/2022, stopped 10/04/2022 Cabozantinib 10/11/2022 MRI abdomen 12/27/2022-technical limitation on the 07/20/2022 exam makes direct comparison difficult, notes significant change in the enhancing  liver lesions Cabozantinib placed on hold 12/31/2022 Ulcerative colitis-maintained on vedolizumab Peripheral arterial  disease History of a thoracic aortic aneurysm CAD Diabetes Hypertension Chronic renal failure Paroxysmal atrial fibrillation Admission 01/02/2023 with increased diarrhea-sigmoidoscopy with a single ulcer at the dentate line and an anal fissure, sigmoid colon unremarkable.  Negative enteric pathogen panel      Disposition: [name] has hepatocellular carcinoma.  A brain MRI last month revealed stable disease.  He has multifocal hepatocellular carcinoma.  He has limited systemic treatment options.  I presented his case at the GI tumor conference today.  I reviewed the case with Dr. Elby Showers.  He will see Mr. [name] to consider hepatic directed therapy.  Mr. [name] has developed anemia over the past month.  The hemoglobin was stable yesterday.  The etiology of the anemia is unclear.  He denies bleeding.  The anemia could be related to GI blood loss, anemia of chronic disease, cabozantinib, or another etiology.  He appears asymptomatic.  We will check a ferritin and reticulocyte count when he returns in 2 weeks.  The etiology of the persistent diarrhea is unclear.  The diarrhea could be related to inflammatory bowel disease.  He has been off of cabozantinib for the past month.  We will see if Dr. Russella Dar can see him sooner than scheduled.  He will continue Colestid and Lomotil. Thornton Papas, MD  01/29/2023  1:02 PM

## 2023-01-29 ENCOUNTER — Inpatient Hospital Stay (HOSPITAL_BASED_OUTPATIENT_CLINIC_OR_DEPARTMENT_OTHER): Payer: PPO | Admitting: Oncology

## 2023-01-29 ENCOUNTER — Encounter: Payer: Self-pay | Admitting: Oncology

## 2023-01-29 ENCOUNTER — Other Ambulatory Visit: Payer: Self-pay

## 2023-01-29 ENCOUNTER — Other Ambulatory Visit: Payer: Self-pay | Admitting: Oncology

## 2023-01-29 ENCOUNTER — Other Ambulatory Visit (HOSPITAL_COMMUNITY): Payer: Self-pay

## 2023-01-29 ENCOUNTER — Telehealth: Payer: Self-pay

## 2023-01-29 VITALS — BP 104/80 | HR 74 | Temp 98.1°F | Resp 18 | Ht 70.0 in | Wt 204.7 lb

## 2023-01-29 DIAGNOSIS — C220 Liver cell carcinoma: Secondary | ICD-10-CM | POA: Diagnosis not present

## 2023-01-29 DIAGNOSIS — D649 Anemia, unspecified: Secondary | ICD-10-CM

## 2023-01-29 NOTE — Progress Notes (Signed)
The proposed treatment discussed in conference is for discussion purpose only and is not a binding recommendation.  The patients have not been physically examined, or presented with their treatment options.  Therefore, final treatment plans cannot be decided.  

## 2023-01-29 NOTE — Telephone Encounter (Signed)
-----   Message from Wandalee Ferdinand, RN sent at 01/29/2023 12:54 PM EDT ----- Regarding: Appointment [name], Dr. Truett Perna is asking if there is any way Dr. Russella Dar can see [name] soon. He is really struggling with diarrhea. We were not able to get the tincture of opium approved despite appeal. Thanks, Susan,RN

## 2023-01-30 ENCOUNTER — Telehealth: Payer: Self-pay

## 2023-01-30 ENCOUNTER — Other Ambulatory Visit: Payer: Self-pay

## 2023-01-30 ENCOUNTER — Other Ambulatory Visit (HOSPITAL_COMMUNITY): Payer: Self-pay

## 2023-01-30 DIAGNOSIS — R197 Diarrhea, unspecified: Secondary | ICD-10-CM

## 2023-01-30 DIAGNOSIS — K51311 Ulcerative (chronic) rectosigmoiditis with rectal bleeding: Secondary | ICD-10-CM

## 2023-01-30 DIAGNOSIS — K51219 Ulcerative (chronic) proctitis with unspecified complications: Secondary | ICD-10-CM

## 2023-01-30 MED ORDER — COLESTIPOL HCL 1 G PO TABS
1.0000 g | ORAL_TABLET | Freq: Two times a day (BID) | ORAL | 0 refills | Status: DC
Start: 2023-01-30 — End: 2023-03-06

## 2023-01-30 NOTE — Telephone Encounter (Signed)
Matthew Chen I have the pt scheduled to see [name] NP on 02/06/23 at 130 pm.

## 2023-01-30 NOTE — Telephone Encounter (Signed)
Pt called in to ask if pcp would send in a prescription for this med empagliflozin (JARDIANCE) 10 MG TABS tablet [[id number]]. Pt also would like for pcp to send in a new prescription for this med colestipol (COLESTID) 1 g tablet [[id number]].  LOV: 01/28/23  PHARMACY: [id number] [place], [place] - [address] [place] [address]    CB#: [phone]

## 2023-02-04 NOTE — Progress Notes (Shared)
Reason for follow up: Patient presents today via virtual telephone visit for Y90 treatment of HCC right HCC and to discuss additional liver-directed treatment options.   Referring Physician(s): Sherrill,Gary B  History of present illness: Matthew Chen, 67 year old male, has a medical history significant for DM2, HTN, PAD, paroxysmal atrial fibrillation, CKD stage III, thoracic aortic aneurysm and hepatocellular carcinoma. He was referred to our team in 2022 for targeted liver therapy for a 7 cm unifocal liver mass. In the absence of significant liver disease and given the size of the mass a hepatectomy was not indicated. He was deemed a candidate for locoregional therapy with radiation segmentectomy. Split dose TARE was delivered via segment 6 and 7 branches to the right hepatic mass 06/21/21. The patient tolerated the procedure well.   An MRI obtained 2 months after the TARE showed a similarly sized mass with change in enhancement pattern of early enhancing lesion. Image findings attributed to post-radiation effect versus residual active carcinoma. His MRI at 6 months post-procedure demonstrated viability of the index mass in the right lobe of the liver without significant change in size but also revealed multifocal left lobe masses. The patient last followed up with me Dec 27, 2021 and we discussed the possibility of left lobar transarterial radioembolization and conventional transarterial chemoembolization to the previously treated right lobe mass. I reached out to Dr. Truett Perna for further discussion and the patient's case was presented at the GI tumor conference 02/01/22. The consensus recommendation was for systemic therapy.   He underwent 8 cycles of atezolizumab/bevacizumab. MR imaging 07/22/22 and 08/19/22 showed disease progression and he has been on several additional therapies since then. His most recent treatment with Cabozantinib was placed on hold 12/31/2022 due to drug discontinuation. He  is currently off systemic therapy.   His case was again presented at the GI tumor conference 01/29/23 and IR has been asked to evaluate Matthew Chen for additional hepatic directed therapy.   He presents today via virtual telephone visit to discuss treatment options.  Her remains off systemic treatment due to side effects including diarrhea and bowel incontinence, rectal pain, oral lesions, etc.   He reports a recent fever and feeling ill, thought has has resolved.  He has had a good appetite.  Reports mild yellowing of skin, none in sclera.  He does not recall any adverse reaction to prior Y-90.  Past Medical History:  Diagnosis Date   Adenomatous colon polyp 03/2008   Anginal pain (HCC)    Aortic aneurysm, thoracic (HCC) 09/09/2016   4.4 cm by echo 05/2015   Arthritis    "joints in my fingers ache" (07/13/2015)   Bicuspid aortic valve 09/09/2016   Cataract 2019   bilateral eyes   CKD (chronic kidney disease), stage III (HCC)    Coronary artery disease    Diabetes mellitus without complication (HCC)    Hemorrhoids    Hepatocellular carcinoma (HCC)    Hyperlipidemia    Hypertension    Paroxysmal atrial fibrillation (HCC)    Peripheral arterial disease (HCC)    a. dopppler 05/29/2015 revealed high-grade right popliteal and distal left SFA disease b. LE angio 06/28/2015 revealing high grade calcific dx with distal L SFA and R popliteal artery s/p diamondback orbital rotational atherectomy, PTA of L SFA  c. 07/13/2015 diamondback orbital rotational atherectomy and drug eluting balloon angioplasty of R popliteal artery (P2 segment) using distal protection    Type II diabetes mellitus (HCC)    Ulcerative colitis  Past Surgical History:  Procedure Laterality Date   ABDOMINAL AORTOGRAM W/LOWER EXTREMITY N/A 04/27/2018   Procedure: ABDOMINAL AORTOGRAM W/LOWER EXTREMITY;  Surgeon: Runell Gess, MD;  Location: [facility] INVASIVE CV LAB;  Service: Cardiovascular;  Laterality: N/A;   BIOPSY   01/03/2023   Procedure: BIOPSY;  Surgeon: Benancio Deeds, MD;  Location: [facility] ENDOSCOPY;  Service: Gastroenterology;;   COLONOSCOPY     COLONOSCOPY W/ POLYPECTOMY  X 4-5   CORONARY ATHERECTOMY N/A 02/15/2021   Procedure: CORONARY ATHERECTOMY;  Surgeon: Runell Gess, MD;  Location: [facility];  Service: Cardiovascular;  Laterality: N/A;  aborted    CORONARY BALLOON ANGIOPLASTY N/A 02/15/2021   Procedure: CORONARY BALLOON ANGIOPLASTY;  Surgeon: Runell Gess, MD;  Location: [facility];  Service: Cardiovascular;  Laterality: N/A;   COSMETIC SURGERY  < 2000   "removed birthmark from top of my head"   FLEXIBLE SIGMOIDOSCOPY N/A 01/03/2023   Procedure: FLEXIBLE SIGMOIDOSCOPY;  Surgeon: Benancio Deeds, MD;  Location: [facility] ENDOSCOPY;  Service: Gastroenterology;  Laterality: N/A;   IR ANGIOGRAM SELECTIVE EACH ADDITIONAL VESSEL  06/08/2021   IR ANGIOGRAM SELECTIVE EACH ADDITIONAL VESSEL  06/08/2021   IR ANGIOGRAM SELECTIVE EACH ADDITIONAL VESSEL  06/08/2021   IR ANGIOGRAM SELECTIVE EACH ADDITIONAL VESSEL  06/08/2021   IR ANGIOGRAM SELECTIVE EACH ADDITIONAL VESSEL  06/21/2021   IR ANGIOGRAM SELECTIVE EACH ADDITIONAL VESSEL  06/21/2021   IR ANGIOGRAM SELECTIVE EACH ADDITIONAL VESSEL  06/21/2021   IR ANGIOGRAM SELECTIVE EACH ADDITIONAL VESSEL  06/21/2021   IR ANGIOGRAM VISCERAL SELECTIVE  06/08/2021   IR ANGIOGRAM VISCERAL SELECTIVE  06/21/2021   IR EMBO ARTERIAL NOT HEMORR HEMANG INC GUIDE ROADMAPPING  06/08/2021   IR EMBO TUMOR ORGAN ISCHEMIA INFARCT INC GUIDE ROADMAPPING  06/21/2021   IR EMBO TUMOR ORGAN ISCHEMIA INFARCT INC GUIDE ROADMAPPING  06/21/2021   IR RADIOLOGIST EVAL & MGMT  05/08/2021   IR RADIOLOGIST EVAL & MGMT  07/26/2021   IR RADIOLOGIST EVAL & MGMT  09/05/2021   IR RADIOLOGIST EVAL & MGMT  12/27/2021   IR US GUIDE VASC ACCESS LEFT  06/21/2021   IR US GUIDE VASC ACCESS RIGHT  06/08/2021   LEFT HEART CATH AND CORONARY ANGIOGRAPHY N/A 02/08/2021    Procedure: LEFT HEART CATH AND CORONARY ANGIOGRAPHY;  Surgeon: Runell Gess, MD;  Location: [facility];  Service: Cardiovascular;  Laterality: N/A;   PERIPHERAL VASCULAR ATHERECTOMY  04/27/2018   Procedure: PERIPHERAL VASCULAR ATHERECTOMY;  Surgeon: Runell Gess, MD;  Location: [facility] INVASIVE CV LAB;  Service: Cardiovascular;;  right popliteal artery   PERIPHERAL VASCULAR CATHETERIZATION N/A 06/29/2015   Procedure: Lower Extremity Angiography;  Surgeon: Runell Gess, MD;  Location: [facility] INVASIVE CV LAB;  Service: Cardiovascular;  Laterality: N/A;   PERIPHERAL VASCULAR CATHETERIZATION N/A 07/13/2015   Procedure: Lower Extremity Angiography;  Surgeon: Runell Gess, MD;  Location: [facility] INVASIVE CV LAB;  Service: Cardiovascular;  Laterality: N/A;   PERIPHERAL VASCULAR CATHETERIZATION  07/13/2015   Procedure: Peripheral Vascular Atherectomy;  Surgeon: Runell Gess, MD;  Location: [facility];  Service: Cardiovascular;;   PERIPHERAL VASCULAR INTERVENTION  04/27/2018   Procedure: PERIPHERAL VASCULAR INTERVENTION;  Surgeon: Runell Gess, MD;  Location: [facility];  Service: Cardiovascular;;  right popliteal artery   POLYPECTOMY     TONSILLECTOMY     UPPER GASTROINTESTINAL ENDOSCOPY      Allergies: Penicillins  Medications: Prior to Admission medications   Medication Sig Start Date End  Date Taking? Authorizing Provider  acetaminophen (TYLENOL) 500 MG tablet Take 1 tablet (500 mg total) by mouth every 6 (six) hours as needed. Patient taking differently: Take 500 mg by mouth every 6 (six) hours as needed for moderate pain. 03/27/22   Derwood Kaplan, MD  AMBULATORY NON FORMULARY MEDICATION Diltiazem 2%/Lidocaine 2% apply a pea sized amount PR TID 01/03/23   Armbruster, Willaim Rayas, MD  amiodarone (PACERONE) 200 MG tablet Take 1 tablet (200 mg total) by mouth daily. 01/28/23   Graciella Freer, [name]  apixaban (ELIQUIS) 5 MG TABS tablet Take 1 tablet (5 mg  total) by mouth 2 (two) times daily. 01/08/23   Narda Bonds, MD  aspirin EC 81 MG tablet Take 1 tablet (81 mg total) by mouth daily. 06/01/15   Chilton Si, MD  atorvastatin (LIPITOR) 80 MG tablet Take 1 tablet (80 mg total) by mouth daily. 10/30/22   Joylene Grapes, NP  carvedilol (COREG) 12.5 MG tablet Take 1 tablet (12.5 mg total) by mouth 2 (two) times daily with a meal. 01/27/23   Donita Brooks, MD  colestipol (COLESTID) 1 g tablet Take 1 tablet (1 g total) by mouth 2 (two) times daily. 01/30/23 03/01/23  Donita Brooks, MD  cyanocobalamin (VITAMIN B12) 1000 MCG tablet Take 1,000 mcg by mouth daily.    [provider]  diltiazem (CARDIZEM) 30 MG tablet TAKE 1 TABLET (30 MG TOTAL) BY MOUTH 3 (THREE) TIMES DAILY AS NEEDED (PALPITATIONS). 01/28/23 01/28/24  Joylene Grapes, NP  diphenoxylate-atropine (LOMOTIL) 2.5-0.025 MG tablet Take 1-2 tablets by mouth 4 (four) times daily. Patient taking differently: Take 1-2 tablets by mouth 4 (four) times daily. Pt states he is taking 8 pills per day. 01/15/23   Rana Snare, NP  empagliflozin (JARDIANCE) 10 MG TABS tablet Take 1 tablet (10 mg total) by mouth daily. 10/30/22   Joylene Grapes, NP  empagliflozin (JARDIANCE) 25 MG TABS tablet TAKE 1 TABLET BY MOUTH EVERY DAY BEFORE BREAKFAST 01/27/23   Donita Brooks, MD  fenofibrate 160 MG tablet Take 1 tablet (160 mg total) by mouth daily. 03/22/22   Donita Brooks, MD  furosemide (LASIX) 40 MG tablet TAKE 1 TABLET (40 MG TOTAL) BY MOUTH DAILY AS NEEDED FOR EDEMA. 01/27/23   Donita Brooks, MD  glucose blood (ONETOUCH ULTRA) test strip CHECK BLOOD SUGAR TWICE A DAY 03/19/21   Donita Brooks, MD  Insulin Pen Needle (B-D UF III MINI PEN NEEDLES) 31G X 5 MM MISC Use daily with Victoza 05/31/22   Donita Brooks, MD  Iron, Ferrous Sulfate, 325 (65 Fe) MG TABS Take 325 mg by mouth daily. 01/22/23   Donita Brooks, MD  lisinopril (ZESTRIL) 20 MG tablet Take 1 tablet (20 mg total) by mouth  daily. 10/30/22   Joylene Grapes, NP  mesalamine (CANASA) 1000 MG suppository PLACE 1 SUPPOSITORY (1,000 MG TOTAL) RECTALLY AT BEDTIME. 01/27/23   Doree Albee, [name]  Omega-3 Fatty Acids (FISH OIL) 1000 MG CAPS Take 1,000 mg by mouth 2 (two) times daily.    [provider]  Opium 10 MG/ML (1%) TINC Take 0.6 mLs (6 mg total) by mouth every 6 (six) hours as needed for diarrhea or loose stools. 01/15/23   Rana Snare, NP  oxyCODONE (ROXICODONE) 5 MG immediate release tablet Take 1 tablet (5 mg total) by mouth every 4 (four) hours as needed for severe pain. 01/03/23   Donita Brooks, MD  pantoprazole (  PROTONIX) 40 MG tablet Take 1 tablet (40 mg total) by mouth 2 (two) times daily. 03/22/22   Donita Brooks, MD  pioglitazone (ACTOS) 30 MG tablet Take 1 tablet (30 mg total) by mouth daily. 01/27/23   Donita Brooks, MD  sildenafil (VIAGRA) 100 MG tablet Take 0.5-1 tablets (50-100 mg total) by mouth daily as needed for erectile dysfunction. 05/30/22   Donita Brooks, MD  sitaGLIPtin (JANUVIA) 100 MG tablet Take 1 tablet (100 mg total) by mouth daily. 11/05/22   Park Meo, FNP  vedolizumab (ENTYVIO) 300 MG injection Inject 300 mg as directed See admin instructions. 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, building services  Tobacco Use   Smoking status: Former    Packs/day: 1.50    Years:  25.00    Additional pack years: 0.00    Total pack years: 37.50    Types: Cigarettes    Quit date: 08/12/1996    Years since quitting: 26.4   Smokeless tobacco: Never   Tobacco comments:    Former smoker 09/17/2022  Vaping Use   Vaping Use: Never used  Substance and Sexual Activity   Alcohol use: Yes    Alcohol/week: 1.0 standard drink of alcohol    Types: 1 Cans of beer per week    Comment: social use   Drug use: No   Sexual activity: Yes  Other Topics Concern   Not on file  Social History Narrative   ** Merged History Encounter **       Social Determinants of Health   Financial Resource Strain: Low Risk  (01/23/2023)   Overall Financial Resource Strain (CARDIA)    Difficulty of Paying Living Expenses: Not hard at all  Food Insecurity: No Food Insecurity (01/23/2023)   Hunger Vital Sign    Worried About Running Out of Food in the Last Year: Never true    Ran Out of Food in the Last Year: Never true  Transportation Needs: No Transportation Needs (01/23/2023)   PRAPARE - Administrator, Civil Service (Medical): No    Lack of Transportation (Non-Medical): No  Physical Activity: Insufficiently Active (01/23/2023)   Exercise Vital Sign    Days of Exercise per Week: 3 days    Minutes of Exercise per Session: 30 min  Stress: No Stress Concern Present (01/23/2023)   [employer] of Occupational Health - Occupational Stress Questionnaire    Feeling of Stress : Only a little  Social Connections: Socially Integrated (01/23/2023)   Social Connection and Isolation Panel [NHANES]    Frequency of Communication with Friends and Family: More than three times a  week    Frequency of Social Gatherings with Friends and Family: Three times a week    Attends Religious Services: More than 4 times per year    Active Member of Clubs or Organizations: Yes    Attends Banker Meetings: 1 to 4 times per year    Marital Status: Married     Vital Signs: There were no  vitals taken for this visit.  No physical exam was performed in lieu of virtual telephone visit.   Imaging: MR 03/22/21    06/08/21  Dose mapping   MR 12/19/21     Seg IV, 1.2 cm (TR5)    Seg IV, 0.7 cm (TR 4)    Seg II, 1.7 cm (TR5)  MR abdomen 12/27/22  Seg VI/VII - LIRADS TR-viable.  Significant capsular retraction and central tumor necrosis from prior Y90.  Peripheral enhancing nodular foci representative of viable tumor.   Seg II mass - 2.8 cm, LIRADS 5   Labs:  CBC: Recent Labs    01/15/23 1042 01/20/23 1607 01/22/23 1309 01/28/23 0803  WBC 6.8 5.0 5.8 7.9  HGB 9.8* 8.8* 8.6* 8.4*  HCT 30.4* 27.7* 27.3* 27.0*  PLT 278.0 238 246 272    COAGS: No results for input(s): "INR", "APTT" in the last 8760 hours.  BMP: Recent Labs    01/05/23 0315 01/11/23 1340 01/15/23 1042 01/15/23 1338 01/20/23 1607 01/22/23 1309  NA 138 136 141 140 142 141  K 3.9 4.1 3.9 4.1 4.0 3.5  CL 109 105 106 107 110 106  CO2 23 24 23 27 26 28   GLUCOSE 94 154* 114* 127* 137* 164*  BUN 21 28* 15 17 16 20   CALCIUM 7.3* 7.9* 8.2* 7.9* 7.9* 8.3*  CREATININE 1.34* 1.61* 1.21 1.24 1.21 1.23  GFRNONAA 58* 47*  --  >60  --  >60    LIVER FUNCTION TESTS: Recent Labs    01/02/23 1843 01/11/23 1340 01/15/23 1338 01/20/23 1607 01/22/23 1309  BILITOT 1.4* 1.2 0.9 0.8 0.9  AST 64* 44* 38 40* 44*  ALT 46* 30 24 23 26   ALKPHOS 101 73 78  --  89  PROT 6.2* 5.5* 5.1* 5.2* 5.7*  ALBUMIN 2.2* 2.0* 2.7*  --  3.0*     Assessment and Plan: 67 year old male without significant underlying liver disease (Childs A, ALBI Grade 2) with biopsy proven large right hepatocellular carcinoma status post transarterial radioembolization with split dose segmentectomy on 06/21/21, noted to have new multifocal bilobar small masses compatible with HCC on follow up imaging, status post multiple rounds of chemo and immunotherapy stopped due to poor tolerance.  Most recent imaging demonstrating  persistent viability of peripheral aspects of treated right lobe mass, corroborated by increasing a-FP (most recent 9.7).    We discussed in depth the multiple locoregional treatment options for his LIRADS TR-viable right lobe mass and LIRADS 5 left lobe mass.  The left lobe mass would be ideal for ablation, however is adjacent to the left dome with close proximity to the heart and diaphragm.  Given good central response of the right lobe mass previously, I believe additional radiation segmentectomy approach to both masses would provide the optimal response.  He tolerated this well in the past with no side effects.  Risks and benefits discussed with the patient including, but not limited to bleeding, infection, vascular injury, post procedural pain, nausea, vomiting and fatigue, contrast induced renal failure, liver failure, radiation injury to the bowel, radiation induced cholecystitis,  neutropenia and possible need for additional procedures.  All of the patient's questions were answered, patient is agreeable to proceed.  Plan for hepatic angiogram with Tc-MAA mapping followed 2 weeks later by radiation segmentectomy with split doses between the right and left lobe masses at [facility].  Plan for left radial artery approach.   Marliss Coots, MD Pager: [phone]    I spent a total of 40 Minutes in face to face in clinical consultation, greater than 50% of which was counseling/coordinating care for hepatocellular carcinoma.

## 2023-02-05 ENCOUNTER — Ambulatory Visit
Admission: RE | Admit: 2023-02-05 | Discharge: 2023-02-05 | Disposition: A | Discharge status: Home / Self Care | Payer: PPO | Source: Ambulatory Visit | Attending: Oncology | Admitting: Oncology

## 2023-02-05 DIAGNOSIS — C220 Liver cell carcinoma: Secondary | ICD-10-CM

## 2023-02-05 HISTORY — PX: IR RADIOLOGIST EVAL & MGMT: IMG5224

## 2023-02-06 ENCOUNTER — Ambulatory Visit: Payer: PPO | Admitting: Nurse Practitioner

## 2023-02-06 ENCOUNTER — Encounter: Payer: Self-pay | Admitting: Nurse Practitioner

## 2023-02-06 VITALS — BP 90/50 | HR 79 | Ht 70.0 in | Wt 209.4 lb

## 2023-02-06 DIAGNOSIS — D508 Other iron deficiency anemias: Secondary | ICD-10-CM

## 2023-02-06 DIAGNOSIS — R1084 Generalized abdominal pain: Secondary | ICD-10-CM | POA: Diagnosis not present

## 2023-02-06 DIAGNOSIS — K51919 Ulcerative colitis, unspecified with unspecified complications: Secondary | ICD-10-CM

## 2023-02-06 MED ORDER — DICYCLOMINE HCL 10 MG PO CAPS: 10.0000 mg | ORAL_CAPSULE | Freq: Two times a day (BID) | ORAL | 0 refills | Status: DC

## 2023-02-06 NOTE — Progress Notes (Signed)
Forwarding to Dr. Russella Dar who is this patient's primary GI - I helped consult on his case when inpatient. Dr. Truett Perna felt strongly his diarrhea was due to chemotherapy at the time. His rectal bleeding at the time was due to anal fissure and rectal ulcer at the time, not the biopsy sites. Hopefully he continues to improve with time.

## 2023-02-06 NOTE — Progress Notes (Addendum)
Primary GI:  Claudette Head, MD  Assessment and Plan   Brief Narrative:  67 y.o.  male whose past medical history includes,  but is not necessarily limited to, ulcerative colitis , on Entyvio, CKD 3, DM 2, multifocal HCC undergoing chemotherapy , A-fib on Eliquis  Ulcerative colitis, on Entyvio.   Hospitalized the end of May with worsening diarrhea / rectal pain.  Enteric path panel negative. Stool  fecal calprotectin elevated. No acute findings on CT scan .  No active ulcerative colitis on flexible sigmoidoscopy ( just anal fissure and rectal ulcer) though path report is confusing because it is for "rectal" biopsy but it appears that it was actually the left colon that was biopsied.  Diarrhea felt to be possibly related to chemotherapeutic agent Carbometyx which had already been placed on hold on 5/21. Query if Carbometyx can cause elevated fecal calprotectin?  -Diarrhea slowly improving.  Continue colestipol 2 g twice daily -Continue Lomotil 1 to 2 tablets 4 times daily as needed -Next Entyvio infusion will be late July  Anal fissure.  Pain has resolved.  He completed nearly 4 weeks of diltiazem ointment, using it three times daily   -Advised to complete the remainder of the prescription even though pain has resolved  Acute anemia / rectal bleeding following flexible sigmoidoscopy with biopsies in late May. He describes copious amounts of blood loss .  Unclear why such profuse rectal bleeding after biopsies (may be due in part to Eliquis, also ? Carbometyx which has been associated with significant hemorrhage.) Hgb declined from ~13 into 9 range.  Hgb declined a little further after admission due to some ongoing rectal bleeding. Hgb has stabilized in mid 8 range. PCP recently started oral iron .   -Increase iron to BID -CBC in 3 weeks  Periumbilical pain , worse with eating, He isn't clear if it gets better with BM. Pain present for several weeks. MRI in mid May ( for [facility] restaging)  without GI findings other than cholelithiasis  without evidence for acute cholecystitis and known multifocal hepatocellular carcinoma. Etiology unclear?  -Trial of Bentyl 10 mg BID. If no improvement after a week then can discontinue it  -Consider CTA to rule out ischemia though his weight has been relatively stable  AKI on CKD , resolved  Multifocal hepatocellular carcinoma Undergoing chemo with Dr. Truett Perna.   A-Fib On Eliquis  Hypotension. SBP today 88-90. He isn't driving.  -I'm going to contact PCP, maybe he needs some medication adjustments. Diarrhea is improving but volume depletion still possible.  Addendum: Sent a message to patient's PCP Dr. Tanya Nones about patient's blood pressure.  Appreciate his immediate response.  He will have nurse contact patient and adjust some of his BP meds   History of Present Illness   Chief complaint:   Oncology asked patient be seen sooner for UC and also new anemia   Office visit 12/31/22 Seen in the office 12/31/2022 by Quentin Mulling, PA with worsening diarrhea,  fecal incontinence and rectal discomfort.  He had significant rectal tenderness on DRE .  Stool studies were negative for infection but his calprotectin was elevated at 504, ESR and CRP were elevated.  CT scan of the abdomen pelvis did not show any abscesses or other complications .  Mesalamine suppositories were prescribed .   01/03/23 Hospitalization Patient ended up being hospitalized a few days later with AKI and hypotension from diarrhea.  Fecal calprotectin was elevated inpatient flexible sigmoidoscopy remarkable for an anal fissure and solitary  ulcer at the dentate line .  The rectum, sigmoid colon and recto-sigmoid colon were normal. Biopsied.  Rectal Biopsy showed hyperplastic and prolapse changes. Negative for activity, chronicity, granuloma, dysplasia or malignancy.  Seems like from the endoscopy report that some biopsies were taken from his left colon but path report only shows  rectal biopsy?  It was thought that diarrhea may be secondary to Carbometyx which Oncology had stopped a few days prior to admission.  Fissure was treated with diltiazem ointment.  Diarrhea improved with Lomotil and colestipol.   01/11/23 Patient called the answering service with ongoing diarrhea.  He was sent to the ED for IV hydration.  He did get IV fluids.  Of note his urine glucose was greater than 500.  His creatinine was elevated at 1.61. After some phone messages between our staff and the patient, we increased his colestipol to 2 g twice daily on 6/13.   Interval History:  Used Dilitiazem TID for almost 4 weeks, now using once daily ( if that). Rectal pain has resolved.    Diarrhea finally improving.  Yesterday he had only 3-4 runny BMs (as opposed to the 10-12 BMs / day he had been having). Finally had a solid BM today. Also, no longer having excessive flatus.  He is currently taking colestipol 2 g twice daily and Lomotil 1 to 2 tablets 4 times a day.   PCP started him on iron a few weeks ago.  He had a significant amount of bleeding following flexible sigmoidoscopy with biopsies.  Preprocedure his hemoglobin was 12.5 , following the procedure it drifted to 9.3 with the bleeding .  Following hospital discharge his hemoglobin was found to have drifted further into the mid 8 range where it has remained stable on oral iron). He hasn't had any rectal bleeding in the last 3 weeks but PCP stopped Eliquis this week.  Deniece Portela has been having periumbilical pain for a few weeks.  He did not bring it up at prior visits because he was more focused on the diarrhea. Over the last few weeks he has felt like his "stomach is in knots" . Pain is exacerbated by eating. The pain possibly gets better with a BM but he isn't sure.  Pain not related to physical activity, it doesn't wake him up at night.  Nothing that he knows of makes it better.  MRI abdomen for [facility] cancer restaging didn't show any GI abnormalities other  than multifocal HCC   Previous GI Endoscopies / Labs / Imaging   Flexible sigmoidoscopy - Anal fissure found on perianal exam.  - A single (solitary) ulcer in the dentate line / anal canal.  - The rectum, sigmoid colon and recto-sigmoid colon are normal. Biopsied. FINAL MICROSCOPIC DIAGNOSIS:   A. RECTUM, BIOPSY:       Benign colonic mucosa with hyperplastic and prolapse changes.       Negative for activity, chronicity, granuloma, dysplasia or  malignancy.       Latest Ref Rng & Units 01/22/2023    1:09 PM 01/20/2023    4:07 PM 01/15/2023    1:38 PM  Hepatic Function  Total Protein 6.5 - 8.1 g/dL 5.7  5.2  5.1   Albumin 3.5 - 5.0 g/dL 3.0   2.7   AST 15 - 41 U/L 44  40  38   ALT 0 - 44 U/L 26  23  24    Alk Phosphatase 38 - 126 U/L 89   78   Total Bilirubin  0.3 - 1.2 mg/dL 0.9  0.8  0.9        Latest Ref Rng & Units 01/28/2023    8:03 AM 01/22/2023    1:09 PM 01/20/2023    4:07 PM  CBC  WBC 3.8 - 10.8 Thousand/uL 7.9  5.8  5.0   Hemoglobin 13.2 - 17.1 g/dL 8.4  8.6  8.8   Hematocrit 38.5 - 50.0 % 27.0  27.3  27.7   Platelets 140 - 400 Thousand/uL 272  246  238      Past Medical History:  Diagnosis Date   Adenomatous colon polyp 03/2008   Anginal pain (HCC)    Aortic aneurysm, thoracic (HCC) 09/09/2016   4.4 cm by echo 05/2015   Arthritis    "joints in my fingers ache" (07/13/2015)   Bicuspid aortic valve 09/09/2016   Cataract 2019   bilateral eyes   CKD (chronic kidney disease), stage III (HCC)    Coronary artery disease    Diabetes mellitus without complication (HCC)    Hemorrhoids    Hepatocellular carcinoma (HCC)    Hyperlipidemia    Hypertension    Paroxysmal atrial fibrillation (HCC)    Peripheral arterial disease (HCC)    a. dopppler 05/29/2015 revealed high-grade right popliteal and distal left SFA disease b. LE angio 06/28/2015 revealing high grade calcific dx with distal L SFA and R popliteal artery s/p diamondback orbital rotational atherectomy, PTA of  L SFA  c. 07/13/2015 diamondback orbital rotational atherectomy and drug eluting balloon angioplasty of R popliteal artery (P2 segment) using distal protection    Type II diabetes mellitus (HCC)    Ulcerative colitis     Past Surgical History:  Procedure Laterality Date   ABDOMINAL AORTOGRAM W/LOWER EXTREMITY N/A 04/27/2018   Procedure: ABDOMINAL AORTOGRAM W/LOWER EXTREMITY;  Surgeon: Runell Gess, MD;  Location: [facility] INVASIVE CV LAB;  Service: Cardiovascular;  Laterality: N/A;   BIOPSY  01/03/2023   Procedure: BIOPSY;  Surgeon: Benancio Deeds, MD;  Location: [facility] ENDOSCOPY;  Service: Gastroenterology;;   COLONOSCOPY     COLONOSCOPY W/ POLYPECTOMY  X 4-5   CORONARY ATHERECTOMY N/A 02/15/2021   Procedure: CORONARY ATHERECTOMY;  Surgeon: Runell Gess, MD;  Location: [facility] INVASIVE CV LAB;  Service: Cardiovascular;  Laterality: N/A;  aborted    CORONARY BALLOON ANGIOPLASTY N/A 02/15/2021   Procedure: CORONARY BALLOON ANGIOPLASTY;  Surgeon: Runell Gess, MD;  Location: [facility] INVASIVE CV LAB;  Service: Cardiovascular;  Laterality: N/A;   COSMETIC SURGERY  < 2000   "removed birthmark from top of my head"   FLEXIBLE SIGMOIDOSCOPY N/A 01/03/2023   Procedure: FLEXIBLE SIGMOIDOSCOPY;  Surgeon: Benancio Deeds, MD;  Location: [facility] ENDOSCOPY;  Service: Gastroenterology;  Laterality: N/A;   IR ANGIOGRAM SELECTIVE EACH ADDITIONAL VESSEL  06/08/2021   IR ANGIOGRAM SELECTIVE EACH ADDITIONAL VESSEL  06/08/2021   IR ANGIOGRAM SELECTIVE EACH ADDITIONAL VESSEL  06/08/2021   IR ANGIOGRAM SELECTIVE EACH ADDITIONAL VESSEL  06/08/2021   IR ANGIOGRAM SELECTIVE EACH ADDITIONAL VESSEL  06/21/2021   IR ANGIOGRAM SELECTIVE EACH ADDITIONAL VESSEL  06/21/2021   IR ANGIOGRAM SELECTIVE EACH ADDITIONAL VESSEL  06/21/2021   IR ANGIOGRAM SELECTIVE EACH ADDITIONAL VESSEL  06/21/2021   IR ANGIOGRAM VISCERAL SELECTIVE  06/08/2021   IR ANGIOGRAM VISCERAL SELECTIVE  06/21/2021   IR EMBO ARTERIAL NOT HEMORR HEMANG  INC GUIDE ROADMAPPING  06/08/2021   IR EMBO TUMOR ORGAN ISCHEMIA INFARCT INC GUIDE ROADMAPPING  06/21/2021   IR EMBO TUMOR ORGAN ISCHEMIA INFARCT INC GUIDE  ROADMAPPING  06/21/2021   IR RADIOLOGIST EVAL & MGMT  05/08/2021   IR RADIOLOGIST EVAL & MGMT  07/26/2021   IR RADIOLOGIST EVAL & MGMT  09/05/2021   IR RADIOLOGIST EVAL & MGMT  12/27/2021   IR RADIOLOGIST EVAL & MGMT  02/05/2023   IR US GUIDE VASC ACCESS LEFT  06/21/2021   IR US GUIDE VASC ACCESS RIGHT  06/08/2021   LEFT HEART CATH AND CORONARY ANGIOGRAPHY N/A 02/08/2021   Procedure: LEFT HEART CATH AND CORONARY ANGIOGRAPHY;  Surgeon: Runell Gess, MD;  Location: [facility];  Service: Cardiovascular;  Laterality: N/A;   PERIPHERAL VASCULAR ATHERECTOMY  04/27/2018   Procedure: PERIPHERAL VASCULAR ATHERECTOMY;  Surgeon: Runell Gess, MD;  Location: [facility] INVASIVE CV LAB;  Service: Cardiovascular;;  right popliteal artery   PERIPHERAL VASCULAR CATHETERIZATION N/A 06/29/2015   Procedure: Lower Extremity Angiography;  Surgeon: Runell Gess, MD;  Location: [facility] INVASIVE CV LAB;  Service: Cardiovascular;  Laterality: N/A;   PERIPHERAL VASCULAR CATHETERIZATION N/A 07/13/2015   Procedure: Lower Extremity Angiography;  Surgeon: Runell Gess, MD;  Location: [place] INVASIVE CV LAB;  Service: Cardiovascular;  Laterality: N/A;   PERIPHERAL VASCULAR CATHETERIZATION  07/13/2015   Procedure: Peripheral Vascular Atherectomy;  Surgeon: Runell Gess, MD;  Location: [facility] CV LAB;  Service: Cardiovascular;;   PERIPHERAL VASCULAR INTERVENTION  04/27/2018   Procedure: PERIPHERAL VASCULAR INTERVENTION;  Surgeon: Runell Gess, MD;  Location: [facility]VAS[facility] CV LAB;  Service: Cardiovascular;;  right popliteal artery   POLYPECTOMY     TONSILLECTOMY     UPPER GASTROINTESTINAL ENDOSCOPY      Family History  Problem Relation Age of Onset   Colon cancer Maternal Grandmother        in her 27's   Colon polyps Mother        in her 65's    Diabetes Mother    Heart disease Father    Diabetes Maternal Aunt        Aunts and Uncles   Esophageal cancer Neg Hx    Stomach cancer Neg Hx    Rectal cancer Neg Hx     Current Medications, Allergies, Family History and Social History were reviewed in [employer] electronic medical record.     Current Outpatient Medications  Medication Sig Dispense Refill   acetaminophen (TYLENOL) 500 MG tablet Take 1 tablet (500 mg total) by mouth every 6 (six) hours as needed. (Patient taking differently: Take 500 mg by mouth every 6 (six) hours as needed for moderate pain.) 30 tablet 0   AMBULATORY NON FORMULARY MEDICATION Diltiazem 2%/Lidocaine 2% apply a pea sized amount PR TID 30 g 1   amiodarone (PACERONE) 200 MG tablet Take 1 tablet (200 mg total) by mouth daily. 90 tablet 3   apixaban (ELIQUIS) 5 MG TABS tablet Take 1 tablet (5 mg total) by mouth 2 (two) times daily.     aspirin EC 81 MG tablet Take 1 tablet (81 mg total) by mouth daily. 90 tablet 3   atorvastatin (LIPITOR) 80 MG tablet Take 1 tablet (80 mg total) by mouth daily. 90 tablet 3   carvedilol (COREG) 12.5 MG tablet Take 1 tablet (12.5 mg total) by mouth 2 (two) times daily with a meal. 180 tablet 1   colestipol (COLESTID) 1 g tablet Take 1 tablet (1 g total) by mouth 2 (two) times daily. 60 tablet 0   cyanocobalamin (VITAMIN B12) 1000 MCG tablet Take 1,000 mcg by mouth daily.  diltiazem (CARDIZEM) 30 MG tablet TAKE 1 TABLET (30 MG TOTAL) BY MOUTH 3 (THREE) TIMES DAILY AS NEEDED (PALPITATIONS). 270 tablet 1   diphenoxylate-atropine (LOMOTIL) 2.5-0.025 MG tablet Take 1-2 tablets by mouth 4 (four) times daily. (Patient taking differently: Take 1-2 tablets by mouth 4 (four) times daily. Pt states he is taking 8 pills per day.) 60 tablet 0   empagliflozin (JARDIANCE) 10 MG TABS tablet Take 1 tablet (10 mg total) by mouth daily. 90 tablet 3   empagliflozin (JARDIANCE) 25 MG TABS tablet TAKE 1 TABLET BY MOUTH EVERY DAY BEFORE BREAKFAST  90 tablet 1   fenofibrate 160 MG tablet Take 1 tablet (160 mg total) by mouth daily. 90 tablet 3   furosemide (LASIX) 40 MG tablet TAKE 1 TABLET (40 MG TOTAL) BY MOUTH DAILY AS NEEDED FOR EDEMA. 30 tablet 0   glucose blood (ONETOUCH ULTRA) test strip CHECK BLOOD SUGAR TWICE A DAY 50 strip 15   Insulin Pen Needle (B-D UF III MINI PEN NEEDLES) 31G X 5 MM MISC Use daily with Victoza 100 each 11   Iron, Ferrous Sulfate, 325 (65 Fe) MG TABS Take 325 mg by mouth daily. 30 tablet 3   lisinopril (ZESTRIL) 20 MG tablet Take 1 tablet (20 mg total) by mouth daily. 90 tablet 3   mesalamine (CANASA) 1000 MG suppository PLACE 1 SUPPOSITORY (1,000 MG TOTAL) RECTALLY AT BEDTIME. 30 suppository 0   Omega-3 Fatty Acids (FISH OIL) 1000 MG CAPS Take 1,000 mg by mouth 2 (two) times daily.     Opium 10 MG/ML (1%) TINC Take 0.6 mLs (6 mg total) by mouth every 6 (six) hours as needed for diarrhea or loose stools. 118 mL 0   oxyCODONE (ROXICODONE) 5 MG immediate release tablet Take 1 tablet (5 mg total) by mouth every 4 (four) hours as needed for severe pain. 30 tablet 0   pantoprazole (PROTONIX) 40 MG tablet Take 1 tablet (40 mg total) by mouth 2 (two) times daily. 60 tablet 11   pioglitazone (ACTOS) 30 MG tablet Take 1 tablet (30 mg total) by mouth daily. 90 tablet 1   sildenafil (VIAGRA) 100 MG tablet Take 0.5-1 tablets (50-100 mg total) by mouth daily as needed for erectile dysfunction. 5 tablet 11   sitaGLIPtin (JANUVIA) 100 MG tablet Take 1 tablet (100 mg total) by mouth daily. 90 tablet 3   vedolizumab (ENTYVIO) 300 MG injection Inject 300 mg as directed See admin instructions. Every 2 months     No current facility-administered medications for this visit.    Review of Systems: No chest pain. No shortness of breath. No urinary complaints.    Physical Exam  Wt Readings from Last 3 Encounters:  01/29/23 204 lb 11.2 oz (92.9 kg)  01/23/23 209 lb (94.8 kg)  01/22/23 209 lb 6.4 oz (95 kg)    BP (!) 90/50    Pulse 79   Ht 5\' 10"  (1.778 m)   Wt 209 lb 6.4 oz (95 kg)   BMI 30.05 kg/m  Constitutional:  Pleasant, pale male in no acute distress. Psychiatric: Normal mood and affect. Behavior is normal. EENT: Pupils normal.  Conjunctivae are normal. No scleral icterus. Neck supple.  Cardiovascular: Normal rate, regular rhythm.  Pulmonary/chest: Effort normal and breath sounds normal. No wheezing, rales or rhonchi. Abdominal: Soft, nondistended, nontender. Bowel sounds active throughout. There are no masses palpable. No hepatomegaly. Neurological: Alert and oriented to person place and time.   [name], NP  02/06/2023, 1:03 PM  Cc:  Donita Brooks, MD

## 2023-02-06 NOTE — Patient Instructions (Signed)
We have sent the following medications to your pharmacy for you to pick up at your convenience: Dicyclomine  Increase oral iron to twice a day  You will need a CBC in 3 weeks  Due to recent changes in healthcare laws, you may see the results of your imaging and laboratory studies on MyChart before your provider has had a chance to review them.  We understand that in some cases there may be results that are confusing or concerning to you. Not all laboratory results come back in the same time frame and the provider may be waiting for multiple results in order to interpret others.  Please give [place] 48 hours in order for your provider to thoroughly review all the results before contacting the office for clarification of your results.    _______________________________________________________  If your blood pressure at your visit was 140/90 or greater, please contact your primary care physician to follow up on this.  _______________________________________________________  If you are age 14 or older, your body mass index should be between 23-30. Your Body mass index is 30.05 kg/m. If this is out of the aforementioned range listed, please consider follow up with your Primary Care Provider.  If you are age 49 or younger, your body mass index should be between 19-25. Your Body mass index is 30.05 kg/m. If this is out of the aformentioned range listed, please consider follow up with your Primary Care Provider.   ________________________________________________________  The [facility] providers would like to encourage you to use [facility] to communicate with providers for non-urgent requests or questions.  Due to long hold times on the telephone, sending your provider a message by [facility] may be a faster and more efficient way to get a response.  Please allow 48 business hours for a response.  Please remember that this is for non-urgent requests.   _______________________________________________________   I appreciate the  opportunity to care for you  Thank You   Midge Minium

## 2023-02-07 ENCOUNTER — Telehealth: Payer: Self-pay

## 2023-02-07 NOTE — Telephone Encounter (Signed)
6/28 @ 8:50 am: LM for pt to call office to discuss. My Chart message also sent. Mjp,lpn  Donita Brooks, MD  Meredith Pel, NP; Darral Dash, LPN Sure, I will be glad to help in any way.   Matthew Chen, Please call Matthew Chen and ask him to stop lisinopril and reduce his carvedilol to 6.25 mg pobid (we will wean him off to avoid tachycardia) due to low BP.  I will be glad to recheck his hgb or he can have it checked at GI in 3 weeks whichever is easier for him.  Have him let us know what his BP is doing Monday after these changes. Thanks, [name]

## 2023-02-10 ENCOUNTER — Inpatient Hospital Stay: Payer: PPO

## 2023-02-10 ENCOUNTER — Other Ambulatory Visit: Payer: PPO

## 2023-02-10 ENCOUNTER — Telehealth: Payer: Self-pay | Admitting: *Deleted

## 2023-02-10 ENCOUNTER — Inpatient Hospital Stay: Payer: PPO | Admitting: Nurse Practitioner

## 2023-02-10 NOTE — Telephone Encounter (Signed)
Per provider, called and rescheduled pt for labs and office visit for 7/2. Pt made aware and verbalized understanding.

## 2023-02-11 ENCOUNTER — Telehealth: Payer: Self-pay

## 2023-02-11 ENCOUNTER — Encounter: Payer: Self-pay | Admitting: Nurse Practitioner

## 2023-02-11 ENCOUNTER — Inpatient Hospital Stay: Payer: PPO | Attending: Oncology

## 2023-02-11 ENCOUNTER — Other Ambulatory Visit: Payer: Self-pay

## 2023-02-11 ENCOUNTER — Inpatient Hospital Stay: Payer: PPO | Admitting: Nurse Practitioner

## 2023-02-11 VITALS — BP 122/63 | HR 71 | Temp 98.2°F | Resp 20 | Ht 70.0 in | Wt 207.8 lb

## 2023-02-11 DIAGNOSIS — D631 Anemia in chronic kidney disease: Secondary | ICD-10-CM | POA: Insufficient documentation

## 2023-02-11 DIAGNOSIS — I2510 Atherosclerotic heart disease of native coronary artery without angina pectoris: Secondary | ICD-10-CM | POA: Insufficient documentation

## 2023-02-11 DIAGNOSIS — E1122 Type 2 diabetes mellitus with diabetic chronic kidney disease: Secondary | ICD-10-CM | POA: Diagnosis not present

## 2023-02-11 DIAGNOSIS — D649 Anemia, unspecified: Secondary | ICD-10-CM

## 2023-02-11 DIAGNOSIS — I129 Hypertensive chronic kidney disease with stage 1 through stage 4 chronic kidney disease, or unspecified chronic kidney disease: Secondary | ICD-10-CM | POA: Diagnosis not present

## 2023-02-11 DIAGNOSIS — N189 Chronic kidney disease, unspecified: Secondary | ICD-10-CM | POA: Diagnosis not present

## 2023-02-11 DIAGNOSIS — C220 Liver cell carcinoma: Secondary | ICD-10-CM

## 2023-02-11 DIAGNOSIS — K5190 Ulcerative colitis, unspecified, without complications: Secondary | ICD-10-CM | POA: Insufficient documentation

## 2023-02-11 DIAGNOSIS — I739 Peripheral vascular disease, unspecified: Secondary | ICD-10-CM | POA: Insufficient documentation

## 2023-02-11 DIAGNOSIS — I480 Paroxysmal atrial fibrillation: Secondary | ICD-10-CM | POA: Insufficient documentation

## 2023-02-11 LAB — CBC WITH DIFFERENTIAL (CANCER CENTER ONLY)
Abs Immature Granulocytes: 0.11 10*3/uL — ABNORMAL HIGH (ref 0.00–0.07)
Basophils Absolute: 0.1 10*3/uL (ref 0.0–0.1)
Basophils Relative: 1 %
Eosinophils Absolute: 0.2 10*3/uL (ref 0.0–0.5)
Eosinophils Relative: 2 %
HCT: 26.1 % — ABNORMAL LOW (ref 39.0–52.0)
Hemoglobin: 8.2 g/dL — ABNORMAL LOW (ref 13.0–17.0)
Immature Granulocytes: 1 %
Lymphocytes Relative: 11 %
Lymphs Abs: 0.9 10*3/uL (ref 0.7–4.0)
MCH: 30.3 pg (ref 26.0–34.0)
MCHC: 31.4 g/dL (ref 30.0–36.0)
MCV: 96.3 fL (ref 80.0–100.0)
Monocytes Absolute: 0.9 10*3/uL (ref 0.1–1.0)
Monocytes Relative: 10 %
Neutro Abs: 6.5 10*3/uL (ref 1.7–7.7)
Neutrophils Relative %: 75 %
Platelet Count: 425 10*3/uL — ABNORMAL HIGH (ref 150–400)
RBC: 2.71 MIL/uL — ABNORMAL LOW (ref 4.22–5.81)
RDW: 18.8 % — ABNORMAL HIGH (ref 11.5–15.5)
WBC Count: 8.6 10*3/uL (ref 4.0–10.5)
nRBC: 0.8 % — ABNORMAL HIGH (ref 0.0–0.2)

## 2023-02-11 LAB — CMP (CANCER CENTER ONLY)
ALT: 29 U/L (ref 0–44)
AST: 53 U/L — ABNORMAL HIGH (ref 15–41)
Albumin: 3 g/dL — ABNORMAL LOW (ref 3.5–5.0)
Alkaline Phosphatase: 118 U/L (ref 38–126)
Anion gap: 8 (ref 5–15)
BUN: 22 mg/dL (ref 8–23)
CO2: 23 mmol/L (ref 22–32)
Calcium: 8.5 mg/dL — ABNORMAL LOW (ref 8.9–10.3)
Chloride: 107 mmol/L (ref 98–111)
Creatinine: 1.36 mg/dL — ABNORMAL HIGH (ref 0.61–1.24)
GFR, Estimated: 57 mL/min — ABNORMAL LOW (ref >60)
Glucose, Bld: 93 mg/dL (ref 70–99)
Potassium: 4.6 mmol/L (ref 3.5–5.1)
Sodium: 138 mmol/L (ref 135–145)
Total Bilirubin: 1.1 mg/dL (ref 0.3–1.2)
Total Protein: 6.3 g/dL — ABNORMAL LOW (ref 6.5–8.1)

## 2023-02-11 LAB — SAMPLE TO BLOOD BANK

## 2023-02-11 LAB — FERRITIN: Ferritin: 243 ng/mL (ref 24–336)

## 2023-02-11 LAB — RETICULOCYTES
Immature Retic Fract: 37.3 % — ABNORMAL HIGH (ref 2.3–15.9)
RBC.: 2.74 MIL/uL — ABNORMAL LOW (ref 4.22–5.81)
Retic Count, Absolute: 94.3 10*3/uL (ref 19.0–186.0)
Retic Ct Pct: 3.4 % — ABNORMAL HIGH (ref 0.4–3.1)

## 2023-02-11 LAB — SAVE SMEAR(SSMR), FOR PROVIDER SLIDE REVIEW

## 2023-02-11 NOTE — Telephone Encounter (Signed)
Spoke with pt reminding him to wear his blue blood bank bracelet tomorrow for his lab appt and for his infusion appt on 02/14/23. Pt verbalizes understanding.

## 2023-02-11 NOTE — Telephone Encounter (Signed)
Called pt to verify that he can come in for lab at [facility] tomorrow.

## 2023-02-11 NOTE — Progress Notes (Signed)
[facility] OFFICE PROGRESS NOTE   Diagnosis: Hepatocellular carcinoma  INTERVAL HISTORY:   Matthew Chen returns as scheduled.  He continues to have frequent bowel movements.  He notes intermittent fecal incontinence.  He is not aware of any bleeding.  Energy level overall is poor.  He has dyspnea on exertion.  Objective:  Vital signs in last 24 hours:  Blood pressure 122/63, pulse 71, temperature 98.2 F (36.8 C), temperature source Oral, resp. rate 20, height 5\' 10"  (1.778 m), weight 207 lb 12.8 oz (94.3 kg), SpO2 100 %.     Resp: Lungs clear bilaterally. Cardio: Regular rate and rhythm. GI: No hepatomegaly. Vascular: Pitting edema lower leg bilaterally.   Lab Results:  Lab Results  Component Value Date   WBC 8.6 02/11/2023   HGB 8.2 (L) 02/11/2023   HCT 26.1 (L) 02/11/2023   MCV 96.3 02/11/2023   PLT 425 (H) 02/11/2023   NEUTROABS 6.5 02/11/2023  Blood smear: The platelets appear increased in number.  The majority the white cells appear as mature neutrophils and lymphocytes.  No blasts.  There are ovalocytes and a few teardrops.  The polychromasia is increased.  There is marked variation in Red cell size.  Imaging:  No results found.  Medications: I have reviewed the patient's current medications.  Assessment/Plan: Hepatocellular carcinoma CT angio chest aorta 01/30/2021-no evidence of thoracic aortic aneurysm, diffuse hepatic steatosis, suggestion of early cirrhosis, 7.1 cm enhancing masslike area in segment 5/6, prominent gastrohepatic and periportal nodes measuring up to 7 mm MRI liver 03/22/2021-arterial hyperenhancing subcapsular mass, segment 6, 7 x 5.1 cm, LI-RADS 5, no pathologically large lymph nodes Ultrasound-guided biopsy 04/24/2021-hepatocellular carcinoma Y90 radioembolization of segment 6 and segment 7 hepatic arterial branches 06/21/2021 MRI abdomen 09/03/2021-new increased enhancement inferior and superior to the dominant right liver  lesion, new 11 mm enhancing lesion in the left liver LIRADS 3, dominant right hepatic lesion stable in size MRI abdomen 12/19/2021-unchanged hyperenhancing mass in the anterior inferior right liver measuring 7.8 x 5 cm, interval enlargement of a arterial enhancing lesion in segment 2- LIRADS 5, new small enhancing lesions hepatic segment 4A measuring 1.2 x 0.9 cm and 0.8 x 0.6 cm- LIRADS 5 Cycle 1 atezolizumab /bevacizumab 02/07/2022 Cycle 2 atezolizumab/bevacizumab 02/27/2022 Cycle 3 atezolizumab/bevacizumab 03/21/2022 Cycle 4 atezolizumab/bevacizumab 04/22/2022 MRI abdomen 05/05/2022-no change in dominant segment 6 lesion, no change in hyperenhancing lesions in segment 4A and segment 2, no new lesions Cycle 5 atezolizumab/bevacizumab 05/13/2022 Cycle 6 atezolizumab/bevacizumab 06/04/2022 Cycle 7 atezolizumab/bevacizumab 06/24/2022 Cycle 8 atezolizumab/bevacizumab 07/15/2022 MRI abdomen 07/20/2022-mild progression of bilateral liver lesions, no evidence of extrahepatic metastatic disease MRI abdomen [facility] 08/19/2022-increased size of the dominant admitted 5/6 lesion, now measuring 12.7 x 8.8 cm, multiple additional hepatic lesions appear larger than on the previous exam, stable mildly enlarged upper abdominal lymph nodes Lenvatinib 09/09/2022, stopped 10/04/2022 Cabozantinib 10/11/2022 MRI abdomen 12/27/2022-technical limitation on the 07/20/2022 exam makes direct comparison difficult, notes significant change in the enhancing liver lesions Cabozantinib placed on hold 12/31/2022 Seen by Dr. Elby Showers Interventional Radiology 02/05/2023-plan for radiation segmentectomy with split doses between right and left lobe masses Ulcerative colitis-maintained on vedolizumab Peripheral arterial disease History of a thoracic aortic aneurysm CAD Diabetes Hypertension Chronic renal failure Paroxysmal atrial fibrillation Admission 01/02/2023 with increased diarrhea-sigmoidoscopy with a single ulcer at the dentate line and an anal  fissure, sigmoid colon unremarkable.  Negative enteric pathogen panel    Disposition: Matthew Chen has multifocal hepatocellular carcinoma.  He recently saw Dr. Elby Showers with Interventional Radiology.  The plan is to retreat with Y90.  He has persistent/progressive anemia.  Etiology remains unclear.  He will complete stool cards.  We will follow-up on the outstanding labs from today.  He is symptomatic from the anemia.  We are making arrangements for a blood transfusion.  We discussed risks including allergic reaction and infection.  He agrees to proceed.  He will return for lab and follow-up in approximately 3 weeks.  Patient seen with Dr. Truett Chen.  Lonna Cobb ANP/GNP-BC   02/11/2023  3:01 PM This was a shared visit with Lonna Cobb.  Matthew Chen saw Dr. Elby Showers and is being scheduled for repeat treatment with Y90.  Systemic treatment options are limited.  He has progressive anemia over the past month.  The etiology of the anemia is unclear.  He denies bleeding.  The anemia may be related to bleeding, chronic disease, renal insufficiency, or a primary bone marrow process.  He could have metastatic disease involving the bone marrow.  Matthew Chen agrees to a Red cell transfusion.  We will check stool Hemoccult cards.  He will return for an office visit in 3 weeks.  I was present for greater than 50% of today's visit.  I performed medical decision making.  Mancel Bale, MD

## 2023-02-12 ENCOUNTER — Other Ambulatory Visit: Payer: Self-pay | Admitting: *Deleted

## 2023-02-12 ENCOUNTER — Other Ambulatory Visit (HOSPITAL_COMMUNITY): Payer: Self-pay | Admitting: Interventional Radiology

## 2023-02-12 ENCOUNTER — Inpatient Hospital Stay: Payer: PPO

## 2023-02-12 DIAGNOSIS — C220 Liver cell carcinoma: Secondary | ICD-10-CM

## 2023-02-12 DIAGNOSIS — D649 Anemia, unspecified: Secondary | ICD-10-CM

## 2023-02-12 LAB — BPAM RBC: Unit Type and Rh: 5100

## 2023-02-12 LAB — TYPE AND SCREEN: ABO/RH(D): O POS

## 2023-02-12 LAB — PREPARE RBC (CROSSMATCH)

## 2023-02-12 LAB — ABO/RH: ABO/RH(D): O POS

## 2023-02-12 NOTE — Progress Notes (Signed)
Confirmed orders are in for transfusion at 0800 on 02/14/23 and confirmed w/[facility] they will pick up from [facility] at 0700 for stat delivery. Released prepare order.

## 2023-02-12 NOTE — Progress Notes (Signed)
@   1637 [facility] blood bank called to report they do not see the prepare orders. Was requested to re-enter orders and release again.

## 2023-02-13 ENCOUNTER — Other Ambulatory Visit: Payer: Self-pay | Admitting: Nurse Practitioner

## 2023-02-13 ENCOUNTER — Other Ambulatory Visit: Payer: Self-pay | Admitting: Family Medicine

## 2023-02-13 DIAGNOSIS — K51219 Ulcerative (chronic) proctitis with unspecified complications: Secondary | ICD-10-CM

## 2023-02-13 DIAGNOSIS — K51311 Ulcerative (chronic) rectosigmoiditis with rectal bleeding: Secondary | ICD-10-CM

## 2023-02-13 DIAGNOSIS — R197 Diarrhea, unspecified: Secondary | ICD-10-CM

## 2023-02-14 ENCOUNTER — Inpatient Hospital Stay: Payer: PPO

## 2023-02-14 ENCOUNTER — Other Ambulatory Visit: Payer: Self-pay | Admitting: *Deleted

## 2023-02-14 ENCOUNTER — Telehealth: Payer: Self-pay | Admitting: *Deleted

## 2023-02-14 ENCOUNTER — Telehealth: Payer: Self-pay | Admitting: Nurse Practitioner

## 2023-02-14 DIAGNOSIS — C220 Liver cell carcinoma: Secondary | ICD-10-CM | POA: Diagnosis not present

## 2023-02-14 DIAGNOSIS — D649 Anemia, unspecified: Secondary | ICD-10-CM

## 2023-02-14 LAB — OCCULT BLOOD X 1 CARD TO LAB, STOOL
Fecal Occult Bld: POSITIVE — AB
Fecal Occult Bld: POSITIVE — AB
Fecal Occult Bld: POSITIVE — AB

## 2023-02-14 LAB — BPAM RBC
ISSUE DATE / TIME: 202407050700
ISSUE DATE / TIME: 202407050700
Unit Type and Rh: 5100
Unit Type and Rh: 5100

## 2023-02-14 LAB — TYPE AND SCREEN
Antibody Screen: NEGATIVE
Unit division: 0

## 2023-02-14 MED ORDER — SODIUM CHLORIDE 0.9% IV SOLUTION
250.0000 mL | Freq: Once | INTRAVENOUS | Status: AC
  Administered 2023-02-14: 250 mL via INTRAVENOUS

## 2023-02-14 NOTE — Telephone Encounter (Signed)
-----   Message from [name], MD sent at 02/14/2023  1:32 PM EDT ----- Please call patient, the stool Hemoccult cards are positive for blood, GI bleeding may explain his anemia, please forward result to Dr. Russella Dar and ask him to follow-up with Dr. Russella Dar, may need a repeat endoscopy

## 2023-02-14 NOTE — Telephone Encounter (Signed)
Inbound call from patient requesting a call from a nurse. Patient is have some concerns he stated he have some blood in his stool.. Please advise

## 2023-02-14 NOTE — Patient Instructions (Signed)

## 2023-02-14 NOTE — Telephone Encounter (Signed)
Prescription Request  02/14/2023  LOV: 01/20/2023  What is the name of the medication or equipment? fenofibrate 160 MG tablet [[id number]]  Have you contacted your pharmacy to request a refill? Yes   Which pharmacy would you like this sent to?  [id number] [place], [place] - [address] [place] [address] Phone: [phone] Fax: [phone]    Patient notified that their request is being sent to the clinical staff for review and that they should receive a response within 2 business days.   Please advise at [facility] [phone]

## 2023-02-14 NOTE — Telephone Encounter (Signed)
Mr. Pas made aware stool Hemoccult cards are positive for blood, GI bleeding may explain his anemia, Forwarded result to Dr. Russella Dar. Encouraged him to follow-up with Dr. Russella Dar, may need a repeat endoscopy. Mr. Tufo agrees. Sent staff message to Dr. Ardell Isaacs nurse as well.

## 2023-02-14 NOTE — Telephone Encounter (Signed)
Last reordered 01/30/23 #60   Requested Prescriptions  Refused Prescriptions Disp Refills   colestipol (COLESTID) 1 g tablet [Pharmacy Med Name: COLESTIPOL HCL 1 GM TABLET] 60 tablet 0    Sig: TAKE 1 TABLET BY MOUTH 2 TIMES DAILY.     Cardiovascular:  Antilipid - Bile Acid Sequestrants Failed - 02/14/2023  9:35 AM      Failed - Valid encounter within last 12 months    Recent Outpatient Visits           2 years ago Type 2 diabetes mellitus with other specified complication, unspecified whether long term insulin use (HCC)   [facility] Pickard, Priscille Heidelberg, MD   2 years ago Type 2 diabetes mellitus with other specified complication, unspecified whether long term insulin use (HCC)   [facility] Pickard, Priscille Heidelberg, MD   3 years ago Benign essential HTN   [facility] Tanya Nones, Priscille Heidelberg, MD   3 years ago Benign essential HTN   [facility] Tanya Nones, Priscille Heidelberg, MD   4 years ago Benign essential HTN   [facility] Pickard, Priscille Heidelberg, MD       Future Appointments             In 1 month Camnitz, Andree Coss, MD [facility]   In 2 months Allyson Sabal, Delton See, MD [facility]            Failed - Lipid Panel in normal range within the last 12 months    Cholesterol, Total  Date Value Ref Range Status  10/07/2016 119 100 - 199 mg/dL Final   Cholesterol  Date Value Ref Range Status  05/27/2022 110 <200 mg/dL Final   LDL Cholesterol (Calc)  Date Value Ref Range Status  05/27/2022 49 mg/dL (calc) Final    Comment:    Reference range: <100 . Desirable range <100 mg/dL for primary prevention;   <70 mg/dL for patients with CHD or diabetic patients  with > or = 2 CHD risk factors. Marland Kitchen LDL-C is now calculated using the Martin-Hopkins  calculation, which is a validated novel method providing  better accuracy than the Friedewald equation in the   estimation of LDL-C.  Horald Pollen et al. Lenox Ahr. 6433;295(18): 2061-2068  (http://education.[employer].com/faq/FAQ164)    HDL  Date Value Ref Range Status  05/27/2022 36 (L) > OR = 40 mg/dL Final  84/16/6063 26 (L) >39 mg/dL Final   Triglycerides  Date Value Ref Range Status  05/27/2022 175 (H) <150 mg/dL Final

## 2023-02-15 LAB — TYPE AND SCREEN: Unit division: 0

## 2023-02-15 LAB — BPAM RBC: Blood Product Expiration Date: 202407282359

## 2023-02-17 NOTE — Telephone Encounter (Signed)
Meryl Dare, MD  Josede Cicero, Austin Miles, LPN He has ulcerative colitis and a rectal ulcer on flex sigmoidoscopy in 12/2022. Colonoscopy was done in 2022. He has a follow up visit scheduled with me in August to further discuss. I don't think we will recommend any further evaluation for the heme + stool unless he is having persistent, visible rectal bleeding.       Patient is advised. Appointment 03/24/23 at 8:30 am.

## 2023-02-17 NOTE — Telephone Encounter (Signed)
Noted. See my separate staff message re: heme + stool

## 2023-02-17 NOTE — Telephone Encounter (Signed)
-----   Message from Wandalee Ferdinand, RN sent at 02/14/2023  2:09 PM EDT ----- Regarding: GI Bleeding Hi Patty, Stool Hemoccult cards are positive for blood, GI bleeding may explain his anemia, please forward result to Dr. Russella Dar (results were routed) and ask him to follow-up with Dr. Russella Dar, may need a repeat endoscopy. I have called patient w/result and to reach out to Dr. Ardell Isaacs office. Susan,RN

## 2023-02-17 NOTE — Telephone Encounter (Addendum)
Message forwarded to Dr Russella Dar. [name], NP (last provider to see patient) is out of the office this week. Spoke with the patient. He reports brown soft stools. He had recently been dealing with diarrhea after one of the chemo drugs he was taking. That has "almost gone away completely now." He report "chapped skin" on his "butt cheeks from where I had to wear Depends because of the diarrhea." Asked if he had any indigestion, reflux, GERD or abdominal pain. Patient denies any of this.

## 2023-02-21 NOTE — Telephone Encounter (Signed)
Patient returned your call.

## 2023-02-21 NOTE — Telephone Encounter (Signed)
Called the patient to discuss. No answer. Left him a message asking for a return call.

## 2023-02-21 NOTE — Telephone Encounter (Signed)
PT is returning call because he has seen blood in his stool this morning and wants to make Dr. Russella Dar aware per previous notations. Please advise.

## 2023-02-25 NOTE — Telephone Encounter (Signed)
Follow up appointment with Dr Russella Dar is 03/24/23. Monitoring the schedule for any cancellations. No openings any sooner as of today.

## 2023-02-26 ENCOUNTER — Other Ambulatory Visit: Payer: Self-pay | Admitting: Radiology

## 2023-02-26 DIAGNOSIS — C220 Liver cell carcinoma: Secondary | ICD-10-CM

## 2023-02-27 ENCOUNTER — Ambulatory Visit (INDEPENDENT_AMBULATORY_CARE_PROVIDER_SITE_OTHER): Payer: PPO | Admitting: Family Medicine

## 2023-02-27 VITALS — BP 120/70 | HR 111 | Temp 99.3°F | Ht 70.0 in | Wt 202.0 lb

## 2023-02-27 DIAGNOSIS — R350 Frequency of micturition: Secondary | ICD-10-CM | POA: Diagnosis not present

## 2023-02-27 LAB — URINALYSIS, ROUTINE W REFLEX MICROSCOPIC
Bilirubin Urine: NEGATIVE
Hgb urine dipstick: NEGATIVE
Ketones, ur: NEGATIVE
Leukocytes,Ua: NEGATIVE
Nitrite: NEGATIVE
Protein, ur: NEGATIVE
Specific Gravity, Urine: 1.015 (ref 1.001–1.035)
pH: 5.5 (ref 5.0–8.0)

## 2023-02-27 MED ORDER — SOLIFENACIN SUCCINATE 10 MG PO TABS: 10.0000 mg | ORAL_TABLET | Freq: Every day | ORAL | 11 refills | Status: DC

## 2023-02-27 NOTE — Progress Notes (Signed)
Subjective:    Patient ID: Matthew Chen, male    DOB: Sep 16, 1955, 67 y.o.   MRN: [id number]  Patient reports a 4-month history of increased urinary frequency.  He reports he is having to wake up every hour on the hour to use the bathroom.  He denies a weak stream.  Had a CT scan of the pelvis in May that showed a normal-sized prostate with no abnormalities.  He denies any dysuria or hesitancy or burning.  Urinalysis today shows +2 glucose but no nitrates no leukocyte esterase and no blood.  He reports a strong urinary stream but frequent urges to urinate and sudden urges to urinate. Past Medical History:  Diagnosis Date   Adenomatous colon polyp 03/2008   Anginal pain (HCC)    Aortic aneurysm, thoracic (HCC) 09/09/2016   4.4 cm by echo 05/2015   Arthritis    "joints in my fingers ache" (07/13/2015)   Bicuspid aortic valve 09/09/2016   Cataract 2019   bilateral eyes   CKD (chronic kidney disease), stage III (HCC)    Coronary artery disease    Diabetes mellitus without complication (HCC)    Hemorrhoids    Hepatocellular carcinoma (HCC)    Hyperlipidemia    Hypertension    Paroxysmal atrial fibrillation (HCC)    Peripheral arterial disease (HCC)    a. dopppler 05/29/2015 revealed high-grade right popliteal and distal left SFA disease b. LE angio 06/28/2015 revealing high grade calcific dx with distal L SFA and R popliteal artery s/p diamondback orbital rotational atherectomy, PTA of L SFA  c. 07/13/2015 diamondback orbital rotational atherectomy and drug eluting balloon angioplasty of R popliteal artery (P2 segment) using distal protection    Type II diabetes mellitus (HCC)    Ulcerative colitis    Past Surgical History:  Procedure Laterality Date   ABDOMINAL AORTOGRAM W/LOWER EXTREMITY N/A 04/27/2018   Procedure: ABDOMINAL AORTOGRAM W/LOWER EXTREMITY;  Surgeon: Runell Gess, MD;  Location: [facility];  Service: Cardiovascular;  Laterality: N/A;   BIOPSY  01/03/2023    Procedure: BIOPSY;  Surgeon: Benancio Deeds, MD;  Location: [facility] ENDOSCOPY;  Service: Gastroenterology;;   COLONOSCOPY     COLONOSCOPY W/ POLYPECTOMY  X 4-5   CORONARY ATHERECTOMY N/A 02/15/2021   Procedure: CORONARY ATHERECTOMY;  Surgeon: Runell Gess, MD;  Location: [facility];  Service: Cardiovascular;  Laterality: N/A;  aborted    CORONARY BALLOON ANGIOPLASTY N/A 02/15/2021   Procedure: CORONARY BALLOON ANGIOPLASTY;  Surgeon: Runell Gess, MD;  Location: [facility];  Service: Cardiovascular;  Laterality: N/A;   COSMETIC SURGERY  < 2000   "removed birthmark from top of my head"   FLEXIBLE SIGMOIDOSCOPY N/A 01/03/2023   Procedure: FLEXIBLE SIGMOIDOSCOPY;  Surgeon: Benancio Deeds, MD;  Location: [facility] ENDOSCOPY;  Service: Gastroenterology;  Laterality: N/A;   IR ANGIOGRAM SELECTIVE EACH ADDITIONAL VESSEL  06/08/2021   IR ANGIOGRAM SELECTIVE EACH ADDITIONAL VESSEL  06/08/2021   IR ANGIOGRAM SELECTIVE EACH ADDITIONAL VESSEL  06/08/2021   IR ANGIOGRAM SELECTIVE EACH ADDITIONAL VESSEL  06/08/2021   IR ANGIOGRAM SELECTIVE EACH ADDITIONAL VESSEL  06/21/2021   IR ANGIOGRAM SELECTIVE EACH ADDITIONAL VESSEL  06/21/2021   IR ANGIOGRAM SELECTIVE EACH ADDITIONAL VESSEL  06/21/2021   IR ANGIOGRAM SELECTIVE EACH ADDITIONAL VESSEL  06/21/2021   IR ANGIOGRAM VISCERAL SELECTIVE  06/08/2021   IR ANGIOGRAM VISCERAL SELECTIVE  06/21/2021   IR EMBO ARTERIAL NOT HEMORR HEMANG INC GUIDE ROADMAPPING  06/08/2021   IR  EMBO TUMOR ORGAN ISCHEMIA INFARCT INC GUIDE ROADMAPPING  06/21/2021   IR EMBO TUMOR ORGAN ISCHEMIA INFARCT INC GUIDE ROADMAPPING  06/21/2021   IR RADIOLOGIST EVAL & MGMT  05/08/2021   IR RADIOLOGIST EVAL & MGMT  07/26/2021   IR RADIOLOGIST EVAL & MGMT  09/05/2021   IR RADIOLOGIST EVAL & MGMT  12/27/2021   IR RADIOLOGIST EVAL & MGMT  02/05/2023   IR US GUIDE VASC ACCESS LEFT  06/21/2021   IR US GUIDE VASC ACCESS RIGHT  06/08/2021   LEFT HEART CATH AND CORONARY  ANGIOGRAPHY N/A 02/08/2021   Procedure: LEFT HEART CATH AND CORONARY ANGIOGRAPHY;  Surgeon: Runell Gess, MD;  Location: [facility];  Service: Cardiovascular;  Laterality: N/A;   PERIPHERAL VASCULAR ATHERECTOMY  04/27/2018   Procedure: PERIPHERAL VASCULAR ATHERECTOMY;  Surgeon: Runell Gess, MD;  Location: [facility] CV LAB;  Service: Cardiovascular;;  right popliteal artery   PERIPHERAL VASCULAR CATHETERIZATION N/A 06/29/2015   Procedure: Lower Extremity Angiography;  Surgeon: Runell Gess, MD;  Location: [facility] INVASIVE CV LAB;  Service: Cardiovascular;  Laterality: N/A;   PERIPHERAL VASCULAR CATHETERIZATION N/A 07/13/2015   Procedure: Lower Extremity Angiography;  Surgeon: Runell Gess, MD;  Location: [facility] INVASIVE CV LAB;  Service: Cardiovascular;  Laterality: N/A;   PERIPHERAL VASCULAR CATHETERIZATION  07/13/2015   Procedure: Peripheral Vascular Atherectomy;  Surgeon: Runell Gess, MD;  Location: [facility];  Service: Cardiovascular;;   PERIPHERAL VASCULAR INTERVENTION  04/27/2018   Procedure: PERIPHERAL VASCULAR INTERVENTION;  Surgeon: Runell Gess, MD;  Location: [facility];  Service: Cardiovascular;;  right popliteal artery   POLYPECTOMY     TONSILLECTOMY     UPPER GASTROINTESTINAL ENDOSCOPY     Current Outpatient Medications on File Prior to Visit  Medication Sig Dispense Refill   acetaminophen (TYLENOL) 500 MG tablet Take 1 tablet (500 mg total) by mouth every 6 (six) hours as needed. (Patient taking differently: Take 500 mg by mouth every 6 (six) hours as needed for moderate pain.) 30 tablet 0   amiodarone (PACERONE) 200 MG tablet Take 1 tablet (200 mg total) by mouth daily. 90 tablet 3   aspirin EC 81 MG tablet Take 1 tablet (81 mg total) by mouth daily. 90 tablet 3   atorvastatin (LIPITOR) 80 MG tablet Take 1 tablet (80 mg total) by mouth daily. 90 tablet 3   carvedilol (COREG) 12.5 MG tablet Take 1 tablet (12.5 mg total) by mouth 2  (two) times daily with a meal. (Patient taking differently: Take 12.5 mg by mouth 2 (two) times daily with a meal. Per recent PCP visit: taking 0.5 tablets BID) 180 tablet 1   cyanocobalamin (VITAMIN B12) 1000 MCG tablet Take 1,000 mcg by mouth daily.     dicyclomine (BENTYL) 10 MG capsule Take 1 capsule (10 mg total) by mouth in the morning and at bedtime. 60 capsule 0   diltiazem (CARDIZEM) 30 MG tablet TAKE 1 TABLET (30 MG TOTAL) BY MOUTH 3 (THREE) TIMES DAILY AS NEEDED (PALPITATIONS). 270 tablet 1   empagliflozin (JARDIANCE) 10 MG TABS tablet Take 1 tablet (10 mg total) by mouth daily. 90 tablet 3   fenofibrate 160 MG tablet Take 1 tablet (160 mg total) by mouth daily. 90 tablet 3   furosemide (LASIX) 40 MG tablet TAKE 1 TABLET (40 MG TOTAL) BY MOUTH DAILY AS NEEDED FOR EDEMA. 30 tablet 0   glucose blood (ONETOUCH ULTRA) test strip CHECK BLOOD SUGAR TWICE A DAY 50  strip 15   Insulin Pen Needle (B-D UF III MINI PEN NEEDLES) 31G X 5 MM MISC Use daily with Victoza 100 each 11   Iron, Ferrous Sulfate, 325 (65 Fe) MG TABS Take 325 mg by mouth daily. 30 tablet 3   mesalamine (CANASA) 1000 MG suppository PLACE 1 SUPPOSITORY (1,000 MG TOTAL) RECTALLY AT BEDTIME. 30 suppository 0   Omega-3 Fatty Acids (FISH OIL) 1000 MG CAPS Take 1,000 mg by mouth 2 (two) times daily.     oxyCODONE (ROXICODONE) 5 MG immediate release tablet Take 1 tablet (5 mg total) by mouth every 4 (four) hours as needed for severe pain. 30 tablet 0   pantoprazole (PROTONIX) 40 MG tablet Take 1 tablet (40 mg total) by mouth 2 (two) times daily. 60 tablet 11   pioglitazone (ACTOS) 30 MG tablet Take 1 tablet (30 mg total) by mouth daily. 90 tablet 1   sildenafil (VIAGRA) 100 MG tablet Take 0.5-1 tablets (50-100 mg total) by mouth daily as needed for erectile dysfunction. 5 tablet 11   sitaGLIPtin (JANUVIA) 100 MG tablet Take 1 tablet (100 mg total) by mouth daily. 90 tablet 3   vedolizumab (ENTYVIO) 300 MG injection Inject 300 mg as  directed See admin instructions. Every 2 months     AMBULATORY NON FORMULARY MEDICATION Diltiazem 2%/Lidocaine 2% apply a pea sized amount PR TID (Patient not taking: Reported on 02/27/2023) 30 g 1   apixaban (ELIQUIS) 5 MG TABS tablet Take 1 tablet (5 mg total) by mouth 2 (two) times daily. (Patient not taking: Reported on 02/11/2023)     colestipol (COLESTID) 1 g tablet Take 1 tablet (1 g total) by mouth 2 (two) times daily. (Patient not taking: Reported on 02/27/2023) 60 tablet 0   diphenoxylate-atropine (LOMOTIL) 2.5-0.025 MG tablet Take 1-2 tablets by mouth 4 (four) times daily. (Patient not taking: Reported on 02/27/2023) 60 tablet 0   lisinopril (ZESTRIL) 20 MG tablet Take 1 tablet (20 mg total) by mouth daily. (Patient not taking: Reported on 02/11/2023) 90 tablet 3   Opium 10 MG/ML (1%) TINC Take 0.6 mLs (6 mg total) by mouth every 6 (six) hours as needed for diarrhea or loose stools. (Patient not taking: Reported on 02/11/2023) 118 mL 0   No current facility-administered medications on file prior to visit.   Allergies  Allergen Reactions   Penicillins Other (See Comments)    Unsure, childhood reaction Has patient had a PCN reaction causing immediate rash, facial/tongue/throat swelling, SOB or lightheadedness with hypotension: Unknown Has patient had a PCN reaction causing severe rash involving mucus membranes or skin necrosis: Unknown Has patient had a PCN reaction that required hospitalization: No Has patient had a PCN reaction occurring within the last 10 years: No If all of the above answers are "NO", then may proceed with Cephalosporin use.    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, building services  Tobacco Use   Smoking status: Former    Current packs/day:  0.00    Average packs/day: 1.5 packs/day for 25.0 years (37.5 ttl pk-yrs)    Types: Cigarettes    Start date: 08/13/1971    Quit date: 08/12/1996    Years since quitting: 26.5   Smokeless tobacco: Never  Tobacco comments:    Former smoker 09/17/2022  Vaping Use   Vaping status: Never Used  Substance and Sexual Activity   Alcohol use: Yes    Alcohol/week: 1.0 standard drink of alcohol    Types: 1 Cans of beer per week    Comment: social use   Drug use: No   Sexual activity: Yes  Other Topics Concern   Not on file  Social History Narrative   ** Merged History Encounter **       Social Determinants of Health   Financial Resource Strain: Low Risk  (01/23/2023)   Overall Financial Resource Strain (CARDIA)    Difficulty of Paying Living Expenses: Not hard at all  Food Insecurity: No Food Insecurity (01/23/2023)   Hunger Vital Sign    Worried About Running Out of Food in the Last Year: Never true    Ran Out of Food in the Last Year: Never true  Transportation Needs: No Transportation Needs (01/23/2023)   PRAPARE - Administrator, Civil Service (Medical): No    Lack of Transportation (Non-Medical): No  Physical Activity: Insufficiently Active (01/23/2023)   Exercise Vital Sign    Days of Exercise per Week: 3 days    Minutes of Exercise per Session: 30 min  Stress: No Stress Concern Present (01/23/2023)   [employer] of Occupational Health - Occupational Stress Questionnaire    Feeling of Stress : Only a little  Social Connections: Socially Integrated (01/23/2023)   Social Connection and Isolation Panel [NHANES]    Frequency of Communication with Friends and Family: More than three times a week    Frequency of Social Gatherings with Friends and Family: Three times a week    Attends Religious Services: More than 4 times per year    Active Member of Clubs or Organizations: Yes    Attends Banker Meetings: 1 to 4 times per year    Marital Status: Married   Catering manager Violence: Not At Risk (01/23/2023)   Humiliation, Afraid, Rape, and Kick questionnaire    Fear of Current or Ex-Partner: No    Emotionally Abused: No    Physically Abused: No    Sexually Abused: No      Review of Systems  All other systems reviewed and are negative.      Objective:   Physical Exam Vitals reviewed.  Constitutional:      General: He is not in acute distress.    Appearance: He is well-developed. He is not ill-appearing, toxic-appearing or diaphoretic.  Neck:     Thyroid: No thyromegaly.     Vascular: No JVD.  Cardiovascular:     Rate and Rhythm: Normal rate. Rhythm irregular.     Heart sounds: Murmur heard.  Pulmonary:     Effort: Pulmonary effort is normal. No respiratory distress.     Breath sounds: Normal breath sounds. No wheezing or rales.  Abdominal:     General: Bowel sounds are normal. There is no distension.     Palpations: Abdomen is soft.     Tenderness: There is no abdominal tenderness. There is no guarding or rebound.  Skin:    General: Skin is warm.     Coloration: Skin is not pale.     Findings: No rash.  Neurological:     Mental Status: He is alert.           Assessment & Plan:  Urinary frequency - Plan: Urinalysis, Routine w reflex microscopic I believe the patient  is dealing with overactive bladder.  He is not taking Lasix.  I recommended he avoid that.  We will try Vesicare 10 mg p.o. PSS in 1 to 2 weeks

## 2023-02-28 ENCOUNTER — Inpatient Hospital Stay (HOSPITAL_COMMUNITY)
Admission: RE | Admit: 2023-02-28 | Discharge: 2023-02-28 | Disposition: A | Discharge status: Home / Self Care | Payer: PPO | Source: Ambulatory Visit | Attending: Interventional Radiology | Admitting: Interventional Radiology

## 2023-02-28 ENCOUNTER — Ambulatory Visit (HOSPITAL_COMMUNITY): Payer: PPO

## 2023-02-28 ENCOUNTER — Encounter (HOSPITAL_COMMUNITY): Payer: PPO

## 2023-03-04 DIAGNOSIS — K5190 Ulcerative colitis, unspecified, without complications: Secondary | ICD-10-CM | POA: Diagnosis not present

## 2023-03-05 ENCOUNTER — Other Ambulatory Visit: Payer: Self-pay | Admitting: Radiology

## 2023-03-06 ENCOUNTER — Encounter: Payer: Self-pay | Admitting: Oncology

## 2023-03-06 ENCOUNTER — Inpatient Hospital Stay: Payer: PPO | Admitting: Oncology

## 2023-03-06 ENCOUNTER — Inpatient Hospital Stay: Payer: PPO

## 2023-03-06 VITALS — BP 130/80 | HR 78 | Temp 98.2°F | Resp 18 | Ht 70.0 in | Wt 207.7 lb

## 2023-03-06 DIAGNOSIS — D649 Anemia, unspecified: Secondary | ICD-10-CM

## 2023-03-06 DIAGNOSIS — C220 Liver cell carcinoma: Secondary | ICD-10-CM

## 2023-03-06 LAB — CMP (CANCER CENTER ONLY)
ALT: 18 U/L (ref 0–44)
AST: 43 U/L — ABNORMAL HIGH (ref 15–41)
Albumin: 3 g/dL — ABNORMAL LOW (ref 3.5–5.0)
Alkaline Phosphatase: 124 U/L (ref 38–126)
Anion gap: 6 (ref 5–15)
BUN: 19 mg/dL (ref 8–23)
CO2: 24 mmol/L (ref 22–32)
Calcium: 8.7 mg/dL — ABNORMAL LOW (ref 8.9–10.3)
Chloride: 107 mmol/L (ref 98–111)
Creatinine: 1.04 mg/dL (ref 0.61–1.24)
GFR, Estimated: >60 mL/min (ref >60)
Glucose, Bld: 101 mg/dL — ABNORMAL HIGH (ref 70–99)
Potassium: 3.7 mmol/L (ref 3.5–5.1)
Sodium: 137 mmol/L (ref 135–145)
Total Bilirubin: 1.1 mg/dL (ref 0.3–1.2)
Total Protein: 6.4 g/dL — ABNORMAL LOW (ref 6.5–8.1)

## 2023-03-06 LAB — CBC WITH DIFFERENTIAL (CANCER CENTER ONLY)
Abs Immature Granulocytes: 0.07 10*3/uL (ref 0.00–0.07)
Basophils Absolute: 0.1 10*3/uL (ref 0.0–0.1)
Basophils Relative: 1 %
Eosinophils Absolute: 0.2 10*3/uL (ref 0.0–0.5)
Eosinophils Relative: 2 %
HCT: 30.3 % — ABNORMAL LOW (ref 39.0–52.0)
Hemoglobin: 9.8 g/dL — ABNORMAL LOW (ref 13.0–17.0)
Immature Granulocytes: 1 %
Lymphocytes Relative: 14 %
Lymphs Abs: 1 10*3/uL (ref 0.7–4.0)
MCH: 29 pg (ref 26.0–34.0)
MCHC: 32.3 g/dL (ref 30.0–36.0)
MCV: 89.6 fL (ref 80.0–100.0)
Monocytes Absolute: 0.9 10*3/uL (ref 0.1–1.0)
Monocytes Relative: 12 %
Neutro Abs: 4.8 10*3/uL (ref 1.7–7.7)
Neutrophils Relative %: 70 %
Platelet Count: 352 10*3/uL (ref 150–400)
RBC: 3.38 MIL/uL — ABNORMAL LOW (ref 4.22–5.81)
RDW: 21.2 % — ABNORMAL HIGH (ref 11.5–15.5)
WBC Count: 6.9 10*3/uL (ref 4.0–10.5)
nRBC: 0.3 % — ABNORMAL HIGH (ref 0.0–0.2)

## 2023-03-06 LAB — SAMPLE TO BLOOD BANK

## 2023-03-06 NOTE — Progress Notes (Signed)
[facility] OFFICE PROGRESS NOTE   Diagnosis: Hepatocellular carcinoma  INTERVAL HISTORY:   Matthew Chen returns as scheduled.  He reports feeling better.  He noted improvement after the Red cell transfusion 02/14/2023.  Diarrhea is much improved.  He saw Dr. Elby Showers and is being scheduled for repeat Y90 therapy.  He is scheduled for mapping tomorrow. He reports persistent "irritation" near the rectum. Objective:  Vital signs in last 24 hours:  Blood pressure 130/80, pulse 78, temperature 98.2 F (36.8 C), temperature source Oral, resp. rate 18, height 5\' 10"  (1.778 m), weight 207 lb 11.2 oz (94.2 kg), SpO2 100%.   Resp: Lungs clear bilaterally Cardio: Regular rate and rhythm GI: Mildly distended, the liver edge is palpable in the right subcostal region with associated tenderness, no splenomegaly Vascular: 1+ edema in the legs bilaterally  Skin: Erythema at the perineum without skin breakdown, the erythema appears to be fading  Portacath/PICC-without erythema  Lab Results:  Lab Results  Component Value Date   WBC 6.9 03/06/2023   HGB 9.8 (L) 03/06/2023   HCT 30.3 (L) 03/06/2023   MCV 89.6 03/06/2023   PLT 352 03/06/2023   NEUTROABS 4.8 03/06/2023    CMP  Lab Results  Component Value Date   NA 137 03/06/2023   K 3.7 03/06/2023   CL 107 03/06/2023   CO2 24 03/06/2023   GLUCOSE 101 (H) 03/06/2023   BUN 19 03/06/2023   CREATININE 1.04 03/06/2023   CALCIUM 8.7 (L) 03/06/2023   PROT 6.4 (L) 03/06/2023   ALBUMIN 3.0 (L) 03/06/2023   AST 43 (H) 03/06/2023   ALT 18 03/06/2023   ALKPHOS 124 03/06/2023   BILITOT 1.1 03/06/2023   GFRNONAA >60 03/06/2023   GFRAA 49 (L) 11/20/2020    Lab Results  Component Value Date   CEA1 1.9 06/21/2021    Medications: I have reviewed the patient's current medications.   Assessment/Plan: Hepatocellular carcinoma CT angio chest aorta 01/30/2021-no evidence of thoracic aortic aneurysm, diffuse hepatic steatosis,  suggestion of early cirrhosis, 7.1 cm enhancing masslike area in segment 5/6, prominent gastrohepatic and periportal nodes measuring up to 7 mm MRI liver 03/22/2021-arterial hyperenhancing subcapsular mass, segment 6, 7 x 5.1 cm, LI-RADS 5, no pathologically large lymph nodes Ultrasound-guided biopsy 04/24/2021-hepatocellular carcinoma Y90 radioembolization of segment 6 and segment 7 hepatic arterial branches 06/21/2021 MRI abdomen 09/03/2021-new increased enhancement inferior and superior to the dominant right liver lesion, new 11 mm enhancing lesion in the left liver LIRADS 3, dominant right hepatic lesion stable in size MRI abdomen 12/19/2021-unchanged hyperenhancing mass in the anterior inferior right liver measuring 7.8 x 5 cm, interval enlargement of a arterial enhancing lesion in segment 2- LIRADS 5, new small enhancing lesions hepatic segment 4A measuring 1.2 x 0.9 cm and 0.8 x 0.6 cm- LIRADS 5 Cycle 1 atezolizumab /bevacizumab 02/07/2022 Cycle 2 atezolizumab/bevacizumab 02/27/2022 Cycle 3 atezolizumab/bevacizumab 03/21/2022 Cycle 4 atezolizumab/bevacizumab 04/22/2022 MRI abdomen 05/05/2022-no change in dominant segment 6 lesion, no change in hyperenhancing lesions in segment 4A and segment 2, no new lesions Cycle 5 atezolizumab/bevacizumab 05/13/2022 Cycle 6 atezolizumab/bevacizumab 06/04/2022 Cycle 7 atezolizumab/bevacizumab 06/24/2022 Cycle 8 atezolizumab/bevacizumab 07/15/2022 MRI abdomen 07/20/2022-mild progression of bilateral liver lesions, no evidence of extrahepatic metastatic disease MRI abdomen [facility] 08/19/2022-increased size of the dominant admitted 5/6 lesion, now measuring 12.7 x 8.8 cm, multiple additional hepatic lesions appear larger than on the previous exam, stable mildly enlarged upper abdominal lymph nodes Lenvatinib 09/09/2022, stopped 10/04/2022 Cabozantinib 10/11/2022 MRI abdomen 12/27/2022-technical limitation on the 07/20/2022 exam  makes direct comparison difficult, notes significant  change in the enhancing liver lesions Cabozantinib placed on hold 12/31/2022 Seen by Dr. Elby Showers Interventional Radiology 02/05/2023-plan for radiation segmentectomy with split doses between right and left lobe masses Ulcerative colitis-maintained on vedolizumab Peripheral arterial disease History of a thoracic aortic aneurysm CAD Diabetes Hypertension Chronic renal failure Paroxysmal atrial fibrillation Admission 01/02/2023 with increased diarrhea-sigmoidoscopy with a single ulcer at the dentate line and an anal fissure, sigmoid colon unremarkable.  Negative enteric pathogen panel Anemia secondary to chronic disease, renal failure, possible GI bleeding-status post Red cell transfusion 02/14/2023.      Disposition: Matthew Chen has improved performance status.  He is being scheduled for Y90 segmentectomy to the right and left liver masses.  He will return for an office visit 2-3 weeks following the Y90 procedure.  The diarrhea has already resolved.  The diarrhea may have been related to cabozantinib.  The anemia has improved following the Red cell transfusion 02/14/2023.  We will check a CBC at the next office visit. He will try Desitin for the erythema at the perineum. Thornton Papas, MD  03/06/2023  3:20 PM

## 2023-03-06 NOTE — H&P (Signed)
Referring Physician(s): Sherrill,B  Supervising Physician: Marliss Coots  Patient Status:  Matthew Chen  Chief Complaint: Hepatocellular carcinoma    Subjective: 67 year old male without significant underlying liver disease (Childs A, ALBI Grade 2) with biopsy proven large right hepatocellular carcinoma status post transarterial radioembolization with split dose segmentectomy on 06/21/21, noted to have new multifocal bilobar small masses compatible with HCC on follow up imaging, status post multiple rounds of chemo and immunotherapy stopped due to poor tolerance.  Most recent imaging demonstrating persistent viability of peripheral aspects of treated right lobe mass, corroborated by increasing AFP (most recent 9.7).  Patient is status post virtual consultation with Dr. Elby Showers on 02/05/2023 to discuss additional liver directed treatment options.  He was deemed an appropriate candidate for radiation segmentectomy with split doses between the right and left lobe liver masses.  He presents today for hepatic arteriogram with roadmapping study prior to additional treatment.  Additional past medical history as listed below. He currently denies fever,HA,CP,dyspnea, cough, back pain,N/V or bleeding; he does have occ RUQ discomfort.   Past Medical History:  Diagnosis Date   Adenomatous colon polyp 03/2008   Anginal pain (HCC)    Aortic aneurysm, thoracic (HCC) 09/09/2016   4.4 cm by echo 05/2015   Arthritis    "joints in my fingers ache" (07/13/2015)   Bicuspid aortic valve 09/09/2016   Cataract 2019   bilateral eyes   CKD (chronic kidney disease), stage III (HCC)    Coronary artery disease    Diabetes mellitus without complication (HCC)    Hemorrhoids    Hepatocellular carcinoma (HCC)    Hyperlipidemia    Hypertension    Paroxysmal atrial fibrillation (HCC)    Peripheral arterial disease (HCC)    a. dopppler 05/29/2015 revealed high-grade right popliteal and distal left SFA disease b. LE  angio 06/28/2015 revealing high grade calcific dx with distal L SFA and R popliteal artery s/p diamondback orbital rotational atherectomy, PTA of L SFA  c. 07/13/2015 diamondback orbital rotational atherectomy and drug eluting balloon angioplasty of R popliteal artery (P2 segment) using distal protection    Type II diabetes mellitus (HCC)    Ulcerative colitis    Past Surgical History:  Procedure Laterality Date   ABDOMINAL AORTOGRAM W/LOWER EXTREMITY N/A 04/27/2018   Procedure: ABDOMINAL AORTOGRAM W/LOWER EXTREMITY;  Surgeon: Runell Gess, MD;  Location: [facility];  Service: Cardiovascular;  Laterality: N/A;   BIOPSY  01/03/2023   Procedure: BIOPSY;  Surgeon: Benancio Deeds, MD;  Location: [facility] ENDOSCOPY;  Service: Gastroenterology;;   COLONOSCOPY     COLONOSCOPY W/ POLYPECTOMY  X 4-5   CORONARY ATHERECTOMY N/A 02/15/2021   Procedure: CORONARY ATHERECTOMY;  Surgeon: Runell Gess, MD;  Location: [facility];  Service: Cardiovascular;  Laterality: N/A;  aborted    CORONARY BALLOON ANGIOPLASTY N/A 02/15/2021   Procedure: CORONARY BALLOON ANGIOPLASTY;  Surgeon: Runell Gess, MD;  Location: [facility];  Service: Cardiovascular;  Laterality: N/A;   COSMETIC SURGERY  < 2000   "removed birthmark from top of my head"   FLEXIBLE SIGMOIDOSCOPY N/A 01/03/2023   Procedure: FLEXIBLE SIGMOIDOSCOPY;  Surgeon: Benancio Deeds, MD;  Location: [facility] ENDOSCOPY;  Service: Gastroenterology;  Laterality: N/A;   IR ANGIOGRAM SELECTIVE EACH ADDITIONAL VESSEL  06/08/2021   IR ANGIOGRAM SELECTIVE EACH ADDITIONAL VESSEL  06/08/2021   IR ANGIOGRAM SELECTIVE EACH ADDITIONAL VESSEL  06/08/2021   IR ANGIOGRAM SELECTIVE EACH ADDITIONAL VESSEL  06/08/2021  IR ANGIOGRAM SELECTIVE EACH ADDITIONAL VESSEL  06/21/2021   IR ANGIOGRAM SELECTIVE EACH ADDITIONAL VESSEL  06/21/2021   IR ANGIOGRAM SELECTIVE EACH ADDITIONAL VESSEL  06/21/2021   IR ANGIOGRAM SELECTIVE EACH ADDITIONAL VESSEL   06/21/2021   IR ANGIOGRAM VISCERAL SELECTIVE  06/08/2021   IR ANGIOGRAM VISCERAL SELECTIVE  06/21/2021   IR EMBO ARTERIAL NOT HEMORR HEMANG INC GUIDE ROADMAPPING  06/08/2021   IR EMBO TUMOR ORGAN ISCHEMIA INFARCT INC GUIDE ROADMAPPING  06/21/2021   IR EMBO TUMOR ORGAN ISCHEMIA INFARCT INC GUIDE ROADMAPPING  06/21/2021   IR RADIOLOGIST EVAL & MGMT  05/08/2021   IR RADIOLOGIST EVAL & MGMT  07/26/2021   IR RADIOLOGIST EVAL & MGMT  09/05/2021   IR RADIOLOGIST EVAL & MGMT  12/27/2021   IR RADIOLOGIST EVAL & MGMT  02/05/2023   IR US GUIDE VASC ACCESS LEFT  06/21/2021   IR US GUIDE VASC ACCESS RIGHT  06/08/2021   LEFT HEART CATH AND CORONARY ANGIOGRAPHY N/A 02/08/2021   Procedure: LEFT HEART CATH AND CORONARY ANGIOGRAPHY;  Surgeon: Runell Gess, MD;  Location: [facility];  Service: Cardiovascular;  Laterality: N/A;   PERIPHERAL VASCULAR ATHERECTOMY  04/27/2018   Procedure: PERIPHERAL VASCULAR ATHERECTOMY;  Surgeon: Runell Gess, MD;  Location: [facility] INVASIVE CV LAB;  Service: Cardiovascular;;  right popliteal artery   PERIPHERAL VASCULAR CATHETERIZATION N/A 06/29/2015   Procedure: Lower Extremity Angiography;  Surgeon: Runell Gess, MD;  Location: [facility] INVASIVE CV LAB;  Service: Cardiovascular;  Laterality: N/A;   PERIPHERAL VASCULAR CATHETERIZATION N/A 07/13/2015   Procedure: Lower Extremity Angiography;  Surgeon: Runell Gess, MD;  Location: [facility] INVASIVE CV LAB;  Service: Cardiovascular;  Laterality: N/A;   PERIPHERAL VASCULAR CATHETERIZATION  07/13/2015   Procedure: Peripheral Vascular Atherectomy;  Surgeon: Runell Gess, MD;  Location: [facility];  Service: Cardiovascular;;   PERIPHERAL VASCULAR INTERVENTION  04/27/2018   Procedure: PERIPHERAL VASCULAR INTERVENTION;  Surgeon: Runell Gess, MD;  Location: [facility];  Service: Cardiovascular;;  right popliteal artery   POLYPECTOMY     TONSILLECTOMY     UPPER GASTROINTESTINAL ENDOSCOPY         Allergies: Penicillins  Medications: Prior to Admission medications   Medication Sig Start Date End Date Taking? Authorizing Provider  acetaminophen (TYLENOL) 500 MG tablet Take 1 tablet (500 mg total) by mouth every 6 (six) hours as needed. Patient taking differently: Take 500 mg by mouth every 6 (six) hours as needed for moderate pain. 03/27/22   Derwood Kaplan, MD  AMBULATORY NON FORMULARY MEDICATION Diltiazem 2%/Lidocaine 2% apply a pea sized amount PR TID Patient not taking: Reported on 02/27/2023 01/03/23   Benancio Deeds, MD  amiodarone (PACERONE) 200 MG tablet Take 1 tablet (200 mg total) by mouth daily. 01/28/23   Graciella Freer, [name]  apixaban (ELIQUIS) 5 MG TABS tablet Take 1 tablet (5 mg total) by mouth 2 (two) times daily. Patient not taking: Reported on 02/11/2023 01/08/23   Narda Bonds, MD  aspirin EC 81 MG tablet Take 1 tablet (81 mg total) by mouth daily. 06/01/15   Chilton Si, MD  atorvastatin (LIPITOR) 80 MG tablet Take 1 tablet (80 mg total) by mouth daily. 10/30/22   Joylene Grapes, NP  carvedilol (COREG) 12.5 MG tablet Take 1 tablet (12.5 mg total) by mouth 2 (two) times daily with a meal. Patient taking differently: Take 12.5 mg by mouth 2 (two) times daily with a meal. Per recent PCP visit:  taking 0.5 tablets BID 01/27/23   Donita Brooks, MD  cyanocobalamin (VITAMIN B12) 1000 MCG tablet Take 1,000 mcg by mouth daily.    [provider]  dicyclomine (BENTYL) 10 MG capsule Take 1 capsule (10 mg total) by mouth in the morning and at bedtime. 02/06/23   Meredith Pel, NP  diltiazem (CARDIZEM) 30 MG tablet TAKE 1 TABLET (30 MG TOTAL) BY MOUTH 3 (THREE) TIMES DAILY AS NEEDED (PALPITATIONS). 01/28/23 01/28/24  Joylene Grapes, NP  diphenoxylate-atropine (LOMOTIL) 2.5-0.025 MG tablet Take 1-2 tablets by mouth 4 (four) times daily. Patient not taking: Reported on 02/27/2023 01/15/23   Rana Snare, NP  empagliflozin (JARDIANCE) 10 MG TABS  tablet Take 1 tablet (10 mg total) by mouth daily. 10/30/22   Joylene Grapes, NP  fenofibrate 160 MG tablet Take 1 tablet (160 mg total) by mouth daily. 03/22/22   Donita Brooks, MD  furosemide (LASIX) 40 MG tablet TAKE 1 TABLET (40 MG TOTAL) BY MOUTH DAILY AS NEEDED FOR EDEMA. 01/27/23   Donita Brooks, MD  glucose blood (ONETOUCH ULTRA) test strip CHECK BLOOD SUGAR TWICE A DAY 03/19/21   Donita Brooks, MD  Insulin Pen Needle (B-D UF III MINI PEN NEEDLES) 31G X 5 MM MISC Use daily with Victoza 05/31/22   Donita Brooks, MD  Iron, Ferrous Sulfate, 325 (65 Fe) MG TABS Take 325 mg by mouth daily. 01/22/23   Donita Brooks, MD  lisinopril (ZESTRIL) 20 MG tablet Take 1 tablet (20 mg total) by mouth daily. Patient not taking: Reported on 02/11/2023 10/30/22   Joylene Grapes, NP  mesalamine (CANASA) 1000 MG suppository PLACE 1 SUPPOSITORY (1,000 MG TOTAL) RECTALLY AT BEDTIME. 01/27/23   Doree Albee, [name]  Omega-3 Fatty Acids (FISH OIL) 1000 MG CAPS Take 1,000 mg by mouth 2 (two) times daily.    [provider]  Opium 10 MG/ML (1%) TINC Take 0.6 mLs (6 mg total) by mouth every 6 (six) hours as needed for diarrhea or loose stools. Patient not taking: Reported on 02/11/2023 01/15/23   Rana Snare, NP  oxyCODONE (ROXICODONE) 5 MG immediate release tablet Take 1 tablet (5 mg total) by mouth every 4 (four) hours as needed for severe pain. 01/03/23   Donita Brooks, MD  pantoprazole (PROTONIX) 40 MG tablet Take 1 tablet (40 mg total) by mouth 2 (two) times daily. 03/22/22   Donita Brooks, MD  pioglitazone (ACTOS) 30 MG tablet Take 1 tablet (30 mg total) by mouth daily. 01/27/23   Donita Brooks, MD  sildenafil (VIAGRA) 100 MG tablet Take 0.5-1 tablets (50-100 mg total) by mouth daily as needed for erectile dysfunction. 05/30/22   Donita Brooks, MD  sitaGLIPtin (JANUVIA) 100 MG tablet Take 1 tablet (100 mg total) by mouth daily. 11/05/22   Park Meo, FNP  solifenacin  (VESICARE) 10 MG tablet Take 1 tablet (10 mg total) by mouth daily. 02/27/23   Donita Brooks, MD  vedolizumab (ENTYVIO) 300 MG injection Inject 300 mg as directed See admin instructions. Every 2 months    [provider]     Vital Signs: Vitals:   03/07/23 0750  BP: (!) 147/84  Pulse: 69  Resp: 16  Temp: (!) 97.5 F (36.4 C)  SpO2: 98%       Code Status: FULL CODE  Physical Exam: awake/alert; chest- CTA bilat; heart- RRR,+murmur; abd- soft, mild dist, +BS, mildly tender RUQ to palpation; bilat 2+  LE edema  Imaging: No results found.  Labs:  CBC: Recent Labs    01/22/23 1309 01/28/23 0803 02/11/23 1357 03/06/23 0846  WBC 5.8 7.9 8.6 6.9  HGB 8.6* 8.4* 8.2* 9.8*  HCT 27.3* 27.0* 26.1* 30.3*  PLT 246 272 425* 352    COAGS: No results for input(s): "INR", "APTT" in the last 8760 hours.  BMP: Recent Labs    01/15/23 1338 01/20/23 1607 01/22/23 1309 02/11/23 1357 03/06/23 0846  NA 140 142 141 138 137  K 4.1 4.0 3.5 4.6 3.7  CL 107 110 106 107 107  CO2 27 26 28 23 24   GLUCOSE 127* 137* 164* 93 101*  BUN 17 16 20 22 19   CALCIUM 7.9* 7.9* 8.3* 8.5* 8.7*  CREATININE 1.24 1.21 1.23 1.36* 1.04  GFRNONAA >60  --  >60 57* >60    LIVER FUNCTION TESTS: Recent Labs    01/15/23 1338 01/20/23 1607 01/22/23 1309 02/11/23 1357 03/06/23 0846  BILITOT 0.9 0.8 0.9 1.1 1.1  AST 38 40* 44* 53* 43*  ALT 24 23 26 29 18   ALKPHOS 78  --  89 118 124  PROT 5.1* 5.2* 5.7* 6.3* 6.4*  ALBUMIN 2.7*  --  3.0* 3.0* 3.0*    Assessment and Plan: 67 year old male with past medical history significant for thoracic aortic aneurysm, arthritis, chronic kidney disease, coronary artery disease, diabetes, hyperlipidemia, hypertension, paroxysmal atrial fibrillation, peripheral arterial disease, ulcerative colitis, and biopsy proven large right hepatocellular carcinoma ,status post transarterial radioembolization with split dose segmentectomy on 06/21/21, noted to have new  multifocal bilobar small masses compatible with HCC on follow up imaging, status post multiple rounds of chemo and immunotherapy stopped due to poor tolerance.  Most recent imaging demonstrating persistent viability of peripheral aspects of treated right lobe mass, corroborated by increasing AFP (most recent 9.7).  Patient is status post virtual consultation with Dr. Elby Showers on 02/05/2023 to discuss additional liver directed treatment options.  He was deemed an appropriate candidate for radiation segmentectomy with split doses between the right and left lobe liver masses.  He presents today for hepatic arteriogram with roadmapping study prior to additional treatment.Risks and benefits of procedure were discussed with the patient including, but not limited to bleeding, infection, vascular injury or contrast induced renal failure.   This interventional procedure involves the use of X-rays and because of the nature of the planned procedure, it is possible that we will have prolonged use of X-ray fluoroscopy.   Potential radiation risks to you include (but are not limited to) the following: - A slightly elevated risk for cancer      several years later in life. This risk is typically less than 0.5% percent. This risk is low in comparison to the normal incidence of human cancer, which is 33% for women and 50% for men according to the [employer]. - Radiation induced injury can include skin redness, resembling a rash, tissue breakdown / ulcers and hair loss (which can be temporary or permanent).    The likelihood of either of these occurring depends on the difficulty of the procedure and whether you are sensitive to radiation due to previous procedures, disease, or genetic conditions.    IF your procedure requires a prolonged use of radiation, you will be notified and given written instructions for further action.  It is your responsibility to monitor the irradiated area for the 2 weeks following  the procedure and to notify your physician if you are concerned that you have  suffered a radiation induced injury.     All of the patient's questions were answered, patient is agreeable to proceed.   Consent signed and in chart.   LABS PENDING   Electronically Signed: D. Jeananne Rama, [name] 03/06/2023, 1:36 PM   I spent a total of  20 minutes at the the patient's bedside AND on the patient's hospital floor or unit, greater than 50% of which was counseling/coordinating care for  hepatic arteriogram with possible embolization/ test Y-90 dosing

## 2023-03-07 ENCOUNTER — Other Ambulatory Visit (HOSPITAL_COMMUNITY): Payer: Self-pay | Admitting: Interventional Radiology

## 2023-03-07 ENCOUNTER — Encounter (HOSPITAL_COMMUNITY): Admission: RE | Admit: 2023-03-07 | Payer: PPO | Source: Ambulatory Visit

## 2023-03-07 ENCOUNTER — Ambulatory Visit (HOSPITAL_COMMUNITY): Payer: PPO

## 2023-03-07 ENCOUNTER — Ambulatory Visit (HOSPITAL_COMMUNITY)
Admission: RE | Admit: 2023-03-07 | Discharge: 2023-03-07 | Disposition: A | Discharge status: Home / Self Care | Payer: PPO | Source: Ambulatory Visit | Attending: Interventional Radiology | Admitting: Interventional Radiology

## 2023-03-07 ENCOUNTER — Other Ambulatory Visit (HOSPITAL_COMMUNITY): Payer: PPO

## 2023-03-07 ENCOUNTER — Encounter (HOSPITAL_COMMUNITY): Payer: Self-pay

## 2023-03-07 DIAGNOSIS — E1122 Type 2 diabetes mellitus with diabetic chronic kidney disease: Secondary | ICD-10-CM | POA: Diagnosis not present

## 2023-03-07 DIAGNOSIS — E785 Hyperlipidemia, unspecified: Secondary | ICD-10-CM | POA: Diagnosis not present

## 2023-03-07 DIAGNOSIS — Z7984 Long term (current) use of oral hypoglycemic drugs: Secondary | ICD-10-CM | POA: Diagnosis not present

## 2023-03-07 DIAGNOSIS — I739 Peripheral vascular disease, unspecified: Secondary | ICD-10-CM | POA: Insufficient documentation

## 2023-03-07 DIAGNOSIS — I2510 Atherosclerotic heart disease of native coronary artery without angina pectoris: Secondary | ICD-10-CM | POA: Diagnosis not present

## 2023-03-07 DIAGNOSIS — C220 Liver cell carcinoma: Secondary | ICD-10-CM

## 2023-03-07 DIAGNOSIS — Z794 Long term (current) use of insulin: Secondary | ICD-10-CM | POA: Insufficient documentation

## 2023-03-07 DIAGNOSIS — E1151 Type 2 diabetes mellitus with diabetic peripheral angiopathy without gangrene: Secondary | ICD-10-CM | POA: Diagnosis not present

## 2023-03-07 DIAGNOSIS — N183 Chronic kidney disease, stage 3 unspecified: Secondary | ICD-10-CM | POA: Insufficient documentation

## 2023-03-07 DIAGNOSIS — I480 Paroxysmal atrial fibrillation: Secondary | ICD-10-CM | POA: Diagnosis not present

## 2023-03-07 DIAGNOSIS — I129 Hypertensive chronic kidney disease with stage 1 through stage 4 chronic kidney disease, or unspecified chronic kidney disease: Secondary | ICD-10-CM | POA: Insufficient documentation

## 2023-03-07 DIAGNOSIS — I712 Thoracic aortic aneurysm, without rupture, unspecified: Secondary | ICD-10-CM | POA: Diagnosis not present

## 2023-03-07 DIAGNOSIS — K5190 Ulcerative colitis, unspecified, without complications: Secondary | ICD-10-CM | POA: Insufficient documentation

## 2023-03-07 DIAGNOSIS — N189 Chronic kidney disease, unspecified: Secondary | ICD-10-CM | POA: Insufficient documentation

## 2023-03-07 HISTORY — PX: IR ANGIOGRAM VISCERAL SELECTIVE: IMG657

## 2023-03-07 HISTORY — PX: IR US GUIDE VASC ACCESS LEFT: IMG2389

## 2023-03-07 HISTORY — PX: IR ANGIOGRAM SELECTIVE EACH ADDITIONAL VESSEL: IMG667

## 2023-03-07 LAB — COMPREHENSIVE METABOLIC PANEL
ALT: 23 U/L (ref 0–44)
AST: 48 U/L — ABNORMAL HIGH (ref 15–41)
Albumin: 2.3 g/dL — ABNORMAL LOW (ref 3.5–5.0)
Alkaline Phosphatase: 167 U/L — ABNORMAL HIGH (ref 38–126)
Anion gap: 9 (ref 5–15)
BUN: 18 mg/dL (ref 8–23)
CO2: 22 mmol/L (ref 22–32)
Calcium: 8.4 mg/dL — ABNORMAL LOW (ref 8.9–10.3)
Chloride: 109 mmol/L (ref 98–111)
Creatinine, Ser: 0.95 mg/dL (ref 0.61–1.24)
GFR, Estimated: >60 mL/min (ref >60)
Glucose, Bld: 108 mg/dL — ABNORMAL HIGH (ref 70–99)
Potassium: 3.7 mmol/L (ref 3.5–5.1)
Sodium: 140 mmol/L (ref 135–145)
Total Bilirubin: 1 mg/dL (ref 0.3–1.2)
Total Protein: 6.3 g/dL — ABNORMAL LOW (ref 6.5–8.1)

## 2023-03-07 LAB — CBC WITH DIFFERENTIAL/PLATELET
Abs Immature Granulocytes: 0.15 10*3/uL — ABNORMAL HIGH (ref 0.00–0.07)
Basophils Absolute: 0 10*3/uL (ref 0.0–0.1)
Basophils Relative: 1 %
Eosinophils Absolute: 0.2 10*3/uL (ref 0.0–0.5)
Eosinophils Relative: 3 %
HCT: 29.4 % — ABNORMAL LOW (ref 39.0–52.0)
Hemoglobin: 9.1 g/dL — ABNORMAL LOW (ref 13.0–17.0)
Immature Granulocytes: 2 %
Lymphocytes Relative: 13 %
Lymphs Abs: 1 10*3/uL (ref 0.7–4.0)
MCH: 28.3 pg (ref 26.0–34.0)
MCHC: 31 g/dL (ref 30.0–36.0)
MCV: 91.6 fL (ref 80.0–100.0)
Monocytes Absolute: 1 10*3/uL (ref 0.1–1.0)
Monocytes Relative: 13 %
Neutro Abs: 5.2 10*3/uL (ref 1.7–7.7)
Neutrophils Relative %: 68 %
Platelets: 324 10*3/uL (ref 150–400)
RBC: 3.21 MIL/uL — ABNORMAL LOW (ref 4.22–5.81)
RDW: 21.4 % — ABNORMAL HIGH (ref 11.5–15.5)
WBC: 7.6 10*3/uL (ref 4.0–10.5)
nRBC: 0.3 % — ABNORMAL HIGH (ref 0.0–0.2)

## 2023-03-07 LAB — PROTIME-INR
INR: 1.3 — ABNORMAL HIGH (ref 0.8–1.2)
Prothrombin Time: 16.4 seconds — ABNORMAL HIGH (ref 11.4–15.2)

## 2023-03-07 LAB — GLUCOSE, CAPILLARY: Glucose-Capillary: 103 mg/dL — ABNORMAL HIGH (ref 70–99)

## 2023-03-07 MED ORDER — SODIUM CHLORIDE 0.9 % IV SOLN: INTRAVENOUS | Status: DC

## 2023-03-07 MED ORDER — VERAPAMIL HCL 2.5 MG/ML IV SOLN
INTRA_ARTERIAL | Status: AC | PRN
  Administered 2023-03-07: 6 mL via INTRA_ARTERIAL

## 2023-03-07 MED ORDER — IOHEXOL 300 MG/ML  SOLN
100.0000 mL | Freq: Once | INTRAMUSCULAR | Status: AC | PRN
  Administered 2023-03-07: 59 mL via INTRA_ARTERIAL

## 2023-03-07 MED ORDER — LIDOCAINE HCL (PF) 1 % IJ SOLN
30.0000 mL | Freq: Once | INTRAMUSCULAR | Status: AC
  Administered 2023-03-07: 1 mL via INTRADERMAL

## 2023-03-07 MED ORDER — NITROGLYCERIN IN D5W 100-5 MCG/ML-% IV SOLN
INTRAVENOUS | Status: AC
  Filled 2023-03-07: qty 250

## 2023-03-07 MED ORDER — FENTANYL CITRATE (PF) 100 MCG/2ML IJ SOLN
INTRAMUSCULAR | Status: AC
  Filled 2023-03-07: qty 2

## 2023-03-07 MED ORDER — TECHNETIUM TO 99M ALBUMIN AGGREGATED
4.4000 | Freq: Once | INTRAVENOUS | Status: AC
  Administered 2023-03-07: 4.4 via INTRAVENOUS

## 2023-03-07 MED ORDER — LIDOCAINE HCL (PF) 1 % IJ SOLN
INTRAMUSCULAR | Status: AC
  Filled 2023-03-07: qty 30

## 2023-03-07 MED ORDER — MIDAZOLAM HCL 2 MG/2ML IJ SOLN
INTRAMUSCULAR | Status: AC
  Filled 2023-03-07: qty 2

## 2023-03-07 MED ORDER — IOHEXOL 300 MG/ML  SOLN
100.0000 mL | Freq: Once | INTRAMUSCULAR | Status: AC | PRN
  Administered 2023-03-07: 35 mL via INTRA_ARTERIAL

## 2023-03-07 MED ORDER — HEPARIN SODIUM (PORCINE) 1000 UNIT/ML IJ SOLN
INTRAMUSCULAR | Status: AC
  Filled 2023-03-07: qty 10

## 2023-03-07 MED ORDER — VERAPAMIL HCL 2.5 MG/ML IV SOLN
INTRAVENOUS | Status: AC
  Filled 2023-03-07: qty 2

## 2023-03-07 MED ORDER — FENTANYL CITRATE (PF) 100 MCG/2ML IJ SOLN
INTRAMUSCULAR | Status: AC | PRN
  Administered 2023-03-07 (×3): 50 ug via INTRAVENOUS

## 2023-03-07 MED ORDER — MIDAZOLAM HCL 2 MG/2ML IJ SOLN
INTRAMUSCULAR | Status: AC | PRN
  Administered 2023-03-07 (×3): 1 mg via INTRAVENOUS

## 2023-03-07 MED ORDER — IOHEXOL 300 MG/ML  SOLN
100.0000 mL | Freq: Once | INTRAMUSCULAR | Status: AC | PRN
  Administered 2023-03-07: 60 mL via INTRA_ARTERIAL

## 2023-03-07 NOTE — Procedures (Signed)
Interventional Radiology Procedure Note  Procedure:  1) Selective catheterization and angiography of the celiac, common hepatic, right hepatic, and left hepatic arteries 2) Cone beam CT 3) Intra-arterial Tc-MAA administration   Findings: Please refer to procedural dictation for full description.  Split Tc-MAA dose between right and left (segs 2 and 3) hepatic arteries.  Left radial artery access, TR band closure.  Complications: None immediate  Estimated Blood Loss: < 5 mL  Recommendations: TR band applied with 9 cc air at 10:56.  Deflation/removal as per protocol. Will follow up Nuc Med scan, discuss results and next steps with pt, family, and Dr. Truett Perna.   Marliss Coots, MD

## 2023-03-07 NOTE — Progress Notes (Deleted)
Lab report from this am 03/07/2023 (hgb) given to Jeananne Rama PA

## 2023-03-11 ENCOUNTER — Telehealth: Payer: Self-pay | Admitting: Family Medicine

## 2023-03-11 ENCOUNTER — Other Ambulatory Visit: Payer: Self-pay | Admitting: Family Medicine

## 2023-03-11 MED ORDER — NIRMATRELVIR/RITONAVIR (PAXLOVID)TABLET: 3.0000 | ORAL_TABLET | Freq: Two times a day (BID) | ORAL | 0 refills | Status: DC

## 2023-03-11 NOTE — Progress Notes (Signed)
Called patient back and changed recommendation.  Stop amiodarone while on paxlovid and resume amiodarone 1 day after.  Patient acknowledged and I explained risk of increased serum concentration and QT prolongation on paxlovid.

## 2023-03-11 NOTE — Telephone Encounter (Signed)
See phone note.  Discontinue amiodarone while on paxlovid.

## 2023-03-11 NOTE — Telephone Encounter (Signed)
Patient called to report positive COVID test result taken last night.   Sx: head congestion, body aches, coughing. Patient stated sx began late Sunday night.   Requesting for provider to send in script for PAXLOVID.  Pharmacy confirmed as:  [id number] [place], [place] - [address] [place] [address] Phone: [phone]  Fax: [phone] DEA #: [id number]    Please advise at [phone]

## 2023-03-12 ENCOUNTER — Other Ambulatory Visit: Payer: Self-pay | Admitting: Family Medicine

## 2023-03-12 NOTE — Telephone Encounter (Signed)
Patient called to notify provider the pharmacy doesn't have enough information to fill the script for PAXLOVID. Call transferred to CMA. Please see previous messages in thread for additional information.

## 2023-03-13 NOTE — Telephone Encounter (Signed)
Called and spoke w/pharmacist and pt and per pcp,"Yes, we stopped amiodarone and he is not on eliquis, he can take paxlovid."  Voiced understanding per pt/pharmacist-[facility]

## 2023-03-13 NOTE — Telephone Encounter (Signed)
Called and spoke w/pt this morning aware Rx can be picked up and ok to take per pcp.

## 2023-03-20 ENCOUNTER — Other Ambulatory Visit: Payer: Self-pay | Admitting: Radiology

## 2023-03-20 DIAGNOSIS — C220 Liver cell carcinoma: Secondary | ICD-10-CM

## 2023-03-20 NOTE — H&P (Signed)
Referring Physician(s): Sherrill,B  Supervising Physician: Suttle,D  Patient Status:  [facility]   Chief Complaint:  Hepatocellular carcinoma  Subjective: 67 year old male without significant underlying liver disease (Childs A, ALBI Grade 2) with biopsy proven large right hepatocellular carcinoma status post transarterial radioembolization with split dose segmentectomy on 06/21/21, noted to have new multifocal bilobar small masses compatible with HCC on follow up imaging, status post multiple rounds of chemo and immunotherapy stopped due to poor tolerance. Most recent imaging demonstrating persistent viability of peripheral aspects of treated right lobe mass, corroborated by increasing AFP (most recent 42.1). Patient is status post virtual consultation with Dr. Elby Showers on 02/05/2023 to discuss additional liver directed treatment options. He was deemed an appropriate candidate for radiation segmentectomy with split doses between the right and left lobe liver masses. He underwent hepatic arteriogram with roadmapping study on 03/07/23 and is scheduled today for right hepatic lobe Y90 radioembolization.  He tested positive for COVID on 03/10/23. He denies fever/chills, HA, dyspnea, abd/back pain,N/V or bleeding; he does have occ cough.     Past Medical History:  Diagnosis Date   Adenomatous colon polyp 03/2008   Anginal pain (HCC)    Aortic aneurysm, thoracic (HCC) 09/09/2016   4.4 cm by echo 05/2015   Arthritis    "joints in my fingers ache" (07/13/2015)   Bicuspid aortic valve 09/09/2016   Cataract 2019   bilateral eyes   CKD (chronic kidney disease), stage III (HCC)    Coronary artery disease    Diabetes mellitus without complication (HCC)    Hemorrhoids    Hepatocellular carcinoma (HCC)    Hyperlipidemia    Hypertension    Paroxysmal atrial fibrillation (HCC)    Peripheral arterial disease (HCC)    a. dopppler 05/29/2015 revealed high-grade right popliteal and distal left SFA disease  b. LE angio 06/28/2015 revealing high grade calcific dx with distal L SFA and R popliteal artery s/p diamondback orbital rotational atherectomy, PTA of L SFA  c. 07/13/2015 diamondback orbital rotational atherectomy and drug eluting balloon angioplasty of R popliteal artery (P2 segment) using distal protection    Type II diabetes mellitus (HCC)    Ulcerative colitis    Past Surgical History:  Procedure Laterality Date   ABDOMINAL AORTOGRAM W/LOWER EXTREMITY N/A 04/27/2018   Procedure: ABDOMINAL AORTOGRAM W/LOWER EXTREMITY;  Surgeon: Runell Gess, MD;  Location: [facility];  Service: Cardiovascular;  Laterality: N/A;   BIOPSY  01/03/2023   Procedure: BIOPSY;  Surgeon: Benancio Deeds, MD;  Location: [facility] ENDOSCOPY;  Service: Gastroenterology;;   COLONOSCOPY     COLONOSCOPY W/ POLYPECTOMY  X 4-5   CORONARY ATHERECTOMY N/A 02/15/2021   Procedure: CORONARY ATHERECTOMY;  Surgeon: Runell Gess, MD;  Location: [facility];  Service: Cardiovascular;  Laterality: N/A;  aborted    CORONARY BALLOON ANGIOPLASTY N/A 02/15/2021   Procedure: CORONARY BALLOON ANGIOPLASTY;  Surgeon: Runell Gess, MD;  Location: [facility];  Service: Cardiovascular;  Laterality: N/A;   COSMETIC SURGERY  < 2000   "removed birthmark from top of my head"   FLEXIBLE SIGMOIDOSCOPY N/A 01/03/2023   Procedure: FLEXIBLE SIGMOIDOSCOPY;  Surgeon: Benancio Deeds, MD;  Location: [facility] ENDOSCOPY;  Service: Gastroenterology;  Laterality: N/A;   IR ANGIOGRAM SELECTIVE EACH ADDITIONAL VESSEL  06/08/2021   IR ANGIOGRAM SELECTIVE EACH ADDITIONAL VESSEL  06/08/2021   IR ANGIOGRAM SELECTIVE EACH ADDITIONAL VESSEL  06/08/2021   IR ANGIOGRAM SELECTIVE EACH ADDITIONAL VESSEL  06/08/2021  IR ANGIOGRAM SELECTIVE EACH ADDITIONAL VESSEL  06/21/2021   IR ANGIOGRAM SELECTIVE EACH ADDITIONAL VESSEL  06/21/2021   IR ANGIOGRAM SELECTIVE EACH ADDITIONAL VESSEL  06/21/2021   IR ANGIOGRAM SELECTIVE EACH ADDITIONAL  VESSEL  06/21/2021   IR ANGIOGRAM SELECTIVE EACH ADDITIONAL VESSEL  03/07/2023   IR ANGIOGRAM SELECTIVE EACH ADDITIONAL VESSEL  03/07/2023   IR ANGIOGRAM VISCERAL SELECTIVE  06/08/2021   IR ANGIOGRAM VISCERAL SELECTIVE  06/21/2021   IR ANGIOGRAM VISCERAL SELECTIVE  03/07/2023   IR EMBO ARTERIAL NOT HEMORR HEMANG INC GUIDE ROADMAPPING  06/08/2021   IR EMBO TUMOR ORGAN ISCHEMIA INFARCT INC GUIDE ROADMAPPING  06/21/2021   IR EMBO TUMOR ORGAN ISCHEMIA INFARCT INC GUIDE ROADMAPPING  06/21/2021   IR RADIOLOGIST EVAL & MGMT  05/08/2021   IR RADIOLOGIST EVAL & MGMT  07/26/2021   IR RADIOLOGIST EVAL & MGMT  09/05/2021   IR RADIOLOGIST EVAL & MGMT  12/27/2021   IR RADIOLOGIST EVAL & MGMT  02/05/2023   IR US GUIDE VASC ACCESS LEFT  06/21/2021   IR US GUIDE VASC ACCESS LEFT  03/07/2023   IR US GUIDE VASC ACCESS LEFT  03/07/2023   IR US GUIDE VASC ACCESS LEFT  03/07/2023   IR US GUIDE VASC ACCESS RIGHT  06/08/2021   LEFT HEART CATH AND CORONARY ANGIOGRAPHY N/A 02/08/2021   Procedure: LEFT HEART CATH AND CORONARY ANGIOGRAPHY;  Surgeon: Runell Gess, MD;  Location: [facility];  Service: Cardiovascular;  Laterality: N/A;   PERIPHERAL VASCULAR ATHERECTOMY  04/27/2018   Procedure: PERIPHERAL VASCULAR ATHERECTOMY;  Surgeon: Runell Gess, MD;  Location: [facility] INVASIVE CV LAB;  Service: Cardiovascular;;  right popliteal artery   PERIPHERAL VASCULAR CATHETERIZATION N/A 06/29/2015   Procedure: Lower Extremity Angiography;  Surgeon: Runell Gess, MD;  Location: [facility] INVASIVE CV LAB;  Service: Cardiovascular;  Laterality: N/A;   PERIPHERAL VASCULAR CATHETERIZATION N/A 07/13/2015   Procedure: Lower Extremity Angiography;  Surgeon: Runell Gess, MD;  Location: [facility] INVASIVE CV LAB;  Service: Cardiovascular;  Laterality: N/A;   PERIPHERAL VASCULAR CATHETERIZATION  07/13/2015   Procedure: Peripheral Vascular Atherectomy;  Surgeon: Runell Gess, MD;  Location: [facility];  Service:  Cardiovascular;;   PERIPHERAL VASCULAR INTERVENTION  04/27/2018   Procedure: PERIPHERAL VASCULAR INTERVENTION;  Surgeon: Runell Gess, MD;  Location: [facility];  Service: Cardiovascular;;  right popliteal artery   POLYPECTOMY     TONSILLECTOMY     UPPER GASTROINTESTINAL ENDOSCOPY        Allergies: Penicillins  Medications: Prior to Admission medications   Medication Sig Start Date End Date Taking? Authorizing Provider  acetaminophen (TYLENOL) 500 MG tablet Take 1 tablet (500 mg total) by mouth every 6 (six) hours as needed. Patient taking differently: Take 500 mg by mouth every 6 (six) hours as needed for moderate pain. 03/27/22   Derwood Kaplan, MD  AMBULATORY NON FORMULARY MEDICATION Diltiazem 2%/Lidocaine 2% apply a pea sized amount PR TID Patient not taking: Reported on 02/27/2023 01/03/23   Benancio Deeds, MD  amiodarone (PACERONE) 200 MG tablet Take 1 tablet (200 mg total) by mouth daily. 01/28/23   Graciella Freer, [name]  apixaban (ELIQUIS) 5 MG TABS tablet Take 1 tablet (5 mg total) by mouth 2 (two) times daily. Patient not taking: Reported on 02/11/2023 01/08/23   Narda Bonds, MD  aspirin EC 81 MG tablet Take 1 tablet (81 mg total) by mouth daily. 06/01/15   Chilton Si, MD  atorvastatin (LIPITOR) 80  MG tablet Take 1 tablet (80 mg total) by mouth daily. 10/30/22   Joylene Grapes, NP  carvedilol (COREG) 12.5 MG tablet Take 1 tablet (12.5 mg total) by mouth 2 (two) times daily with a meal. Patient taking differently: Take 12.5 mg by mouth 2 (two) times daily with a meal. Per recent PCP visit: taking 0.5 tablets BID 01/27/23   Donita Brooks, MD  cyanocobalamin (VITAMIN B12) 1000 MCG tablet Take 1,000 mcg by mouth daily.    [provider]  dicyclomine (BENTYL) 10 MG capsule Take 1 capsule (10 mg total) by mouth in the morning and at bedtime. 02/06/23   Meredith Pel, NP  diltiazem (CARDIZEM) 30 MG tablet TAKE 1 TABLET (30 MG TOTAL) BY  MOUTH 3 (THREE) TIMES DAILY AS NEEDED (PALPITATIONS). 01/28/23 01/28/24  Joylene Grapes, NP  diphenoxylate-atropine (LOMOTIL) 2.5-0.025 MG tablet Take 1-2 tablets by mouth 4 (four) times daily. Patient not taking: Reported on 02/27/2023 01/15/23   Rana Snare, NP  empagliflozin (JARDIANCE) 10 MG TABS tablet Take 1 tablet (10 mg total) by mouth daily. 10/30/22   Joylene Grapes, NP  fenofibrate 160 MG tablet Take 1 tablet (160 mg total) by mouth daily. 03/22/22   Donita Brooks, MD  furosemide (LASIX) 40 MG tablet TAKE 1 TABLET (40 MG TOTAL) BY MOUTH DAILY AS NEEDED FOR EDEMA. 01/27/23   Donita Brooks, MD  glucose blood (ONETOUCH ULTRA) test strip CHECK BLOOD SUGAR TWICE A DAY 03/19/21   Donita Brooks, MD  Insulin Pen Needle (B-D UF III MINI PEN NEEDLES) 31G X 5 MM MISC Use daily with Victoza 05/31/22   Donita Brooks, MD  Iron, Ferrous Sulfate, 325 (65 Fe) MG TABS Take 325 mg by mouth daily. 01/22/23   Donita Brooks, MD  lisinopril (ZESTRIL) 20 MG tablet Take 1 tablet (20 mg total) by mouth daily. Patient not taking: Reported on 02/11/2023 10/30/22   Joylene Grapes, NP  mesalamine (CANASA) 1000 MG suppository PLACE 1 SUPPOSITORY (1,000 MG TOTAL) RECTALLY AT BEDTIME. 01/27/23   Doree Albee, [name]  Omega-3 Fatty Acids (FISH OIL) 1000 MG CAPS Take 1,000 mg by mouth 2 (two) times daily.    [provider]  Opium 10 MG/ML (1%) TINC Take 0.6 mLs (6 mg total) by mouth every 6 (six) hours as needed for diarrhea or loose stools. Patient not taking: Reported on 02/11/2023 01/15/23   Rana Snare, NP  oxyCODONE (ROXICODONE) 5 MG immediate release tablet Take 1 tablet (5 mg total) by mouth every 4 (four) hours as needed for severe pain. 01/03/23   Donita Brooks, MD  pantoprazole (PROTONIX) 40 MG tablet Take 1 tablet (40 mg total) by mouth 2 (two) times daily. 03/22/22   Donita Brooks, MD  PAXLOVID, 300/100, 20 x 150 MG & 10 x 100MG  TBPK PLEASE SEE ATTACHED FOR DETAILED DIRECTIONS 03/13/23    Donita Brooks, MD  pioglitazone (ACTOS) 30 MG tablet Take 1 tablet (30 mg total) by mouth daily. 01/27/23   Donita Brooks, MD  sildenafil (VIAGRA) 100 MG tablet Take 0.5-1 tablets (50-100 mg total) by mouth daily as needed for erectile dysfunction. 05/30/22   Donita Brooks, MD  sitaGLIPtin (JANUVIA) 100 MG tablet Take 1 tablet (100 mg total) by mouth daily. 11/05/22   Park Meo, FNP  solifenacin (VESICARE) 10 MG tablet Take 1 tablet (10 mg total) by mouth daily. 02/27/23   Donita Brooks, MD  vedolizumab (  ENTYVIO) 300 MG injection Inject 300 mg as directed See admin instructions. Every 2 months    [provider]     Vital Signs: Vitals:   03/21/23 0818  BP: (!) 153/94  Pulse: 87  Resp: 18  Temp: 97.7 F (36.5 C)  SpO2: 97%      Code Status: FULL CODE  Physical Exam: awake/alert; chest- CTA bilat; heart- RRR,+murmur; abd- soft, mild dist, +BS,NT; bilat 2+ LE edema   Imaging: No results found.  Labs:  CBC: Recent Labs    01/28/23 0803 02/11/23 1357 03/06/23 0846 03/07/23 0819  WBC 7.9 8.6 6.9 7.6  HGB 8.4* 8.2* 9.8* 9.1*  HCT 27.0* 26.1* 30.3* 29.4*  PLT 272 425* 352 324    COAGS: Recent Labs    03/07/23 0819  INR 1.3*    BMP: Recent Labs    01/22/23 1309 02/11/23 1357 03/06/23 0846 03/07/23 0819  NA 141 138 137 140  K 3.5 4.6 3.7 3.7  CL 106 107 107 109  CO2 28 23 24 22   GLUCOSE 164* 93 101* 108*  BUN 20 22 19 18   CALCIUM 8.3* 8.5* 8.7* 8.4*  CREATININE 1.23 1.36* 1.04 0.95  GFRNONAA >60 57* >60 >60    LIVER FUNCTION TESTS: Recent Labs    01/22/23 1309 02/11/23 1357 03/06/23 0846 03/07/23 0819  BILITOT 0.9 1.1 1.1 1.0  AST 44* 53* 43* 48*  ALT 26 29 18 23   ALKPHOS 89 118 124 167*  PROT 5.7* 6.3* 6.4* 6.3*  ALBUMIN 3.0* 3.0* 3.0* 2.3*    Assessment and Plan: 67 year old male with past medical history significant for thoracic aortic aneurysm, arthritis, chronic kidney disease, coronary artery disease,  diabetes, hyperlipidemia, hypertension, paroxysmal atrial fibrillation, peripheral arterial disease, ulcerative colitis, and biopsy proven large right hepatocellular carcinoma ,status post transarterial radioembolization with split dose segmentectomy on 06/21/21, noted to have new multifocal bilobar small masses compatible with HCC on follow up imaging, status post multiple rounds of chemo and immunotherapy stopped due to poor tolerance.  Most recent imaging demonstrating persistent viability of peripheral aspects of treated right lobe mass, corroborated by increasing AFP (most recent 42.1).  Patient is status post virtual consultation with Dr. Elby Showers on 02/05/2023 to discuss additional liver directed treatment options.  He was deemed an appropriate candidate for radiation segmentectomy with split doses between the right and left lobe liver masses .He underwent hepatic arteriogram with roadmapping study on 03/07/23 and is scheduled today for right hepatic lobe Y90 radioembolization.  He tested positive for COVID on 03/10/23 .Risks and benefits of procedure were discussed with the patient including, but not limited to bleeding, infection, vascular injury or contrast induced renal failure.  This interventional procedure involves the use of X-rays and because of the nature of the planned procedure, it is possible that we will have prolonged use of X-ray fluoroscopy.  Potential radiation risks to you include (but are not limited to) the following: - A slightly elevated risk for cancer  several years later in life. This risk is typically less than 0.5% percent. This risk is low in comparison to the normal incidence of human cancer, which is 33% for women and 50% for men according to the [employer]. - Radiation induced injury can include skin redness, resembling a rash, tissue breakdown / ulcers and hair loss (which can be temporary or permanent).   The likelihood of either of these occurring depends  on the difficulty of the procedure and whether you are sensitive to radiation  due to previous procedures, disease, or genetic conditions.   IF your procedure requires a prolonged use of radiation, you will be notified and given written instructions for further action.  It is your responsibility to monitor the irradiated area for the 2 weeks following the procedure and to notify your physician if you are concerned that you have suffered a radiation induced injury.    All of the patient's questions were answered, patient is agreeable to proceed.  Consent signed and in chart.   LABS PENDING   Electronically Signed: D. Jeananne Rama, [name] 03/20/2023, 6:03 PM   I spent a total of 20 minutes at the the patient's bedside AND on the patient's hospital floor or unit, greater than 50% of which was counseling/coordinating care for hepatic arteriogram with right hepatic Y-90 radioembolization

## 2023-03-21 ENCOUNTER — Other Ambulatory Visit (HOSPITAL_COMMUNITY): Payer: Self-pay | Admitting: Interventional Radiology

## 2023-03-21 ENCOUNTER — Ambulatory Visit (HOSPITAL_COMMUNITY): Admission: RE | Admit: 2023-03-21 | Payer: PPO | Source: Ambulatory Visit

## 2023-03-21 ENCOUNTER — Other Ambulatory Visit: Payer: Self-pay

## 2023-03-21 ENCOUNTER — Ambulatory Visit (HOSPITAL_COMMUNITY)
Admission: RE | Admit: 2023-03-21 | Discharge: 2023-03-21 | Disposition: A | Discharge status: Home / Self Care | Payer: PPO | Source: Ambulatory Visit | Attending: Interventional Radiology | Admitting: Interventional Radiology

## 2023-03-21 ENCOUNTER — Encounter (HOSPITAL_COMMUNITY): Payer: Self-pay

## 2023-03-21 DIAGNOSIS — E1151 Type 2 diabetes mellitus with diabetic peripheral angiopathy without gangrene: Secondary | ICD-10-CM | POA: Diagnosis not present

## 2023-03-21 DIAGNOSIS — Z7984 Long term (current) use of oral hypoglycemic drugs: Secondary | ICD-10-CM | POA: Diagnosis not present

## 2023-03-21 DIAGNOSIS — Z794 Long term (current) use of insulin: Secondary | ICD-10-CM | POA: Insufficient documentation

## 2023-03-21 DIAGNOSIS — C220 Liver cell carcinoma: Secondary | ICD-10-CM

## 2023-03-21 DIAGNOSIS — Z0189 Encounter for other specified special examinations: Secondary | ICD-10-CM | POA: Diagnosis not present

## 2023-03-21 DIAGNOSIS — I129 Hypertensive chronic kidney disease with stage 1 through stage 4 chronic kidney disease, or unspecified chronic kidney disease: Secondary | ICD-10-CM | POA: Diagnosis not present

## 2023-03-21 DIAGNOSIS — Z8616 Personal history of COVID-19: Secondary | ICD-10-CM | POA: Insufficient documentation

## 2023-03-21 DIAGNOSIS — I480 Paroxysmal atrial fibrillation: Secondary | ICD-10-CM | POA: Diagnosis not present

## 2023-03-21 DIAGNOSIS — N183 Chronic kidney disease, stage 3 unspecified: Secondary | ICD-10-CM | POA: Insufficient documentation

## 2023-03-21 DIAGNOSIS — E1122 Type 2 diabetes mellitus with diabetic chronic kidney disease: Secondary | ICD-10-CM | POA: Insufficient documentation

## 2023-03-21 DIAGNOSIS — E785 Hyperlipidemia, unspecified: Secondary | ICD-10-CM | POA: Diagnosis not present

## 2023-03-21 DIAGNOSIS — I2510 Atherosclerotic heart disease of native coronary artery without angina pectoris: Secondary | ICD-10-CM | POA: Diagnosis not present

## 2023-03-21 HISTORY — PX: IR US GUIDE VASC ACCESS LEFT: IMG2389

## 2023-03-21 HISTORY — PX: IR EMBO TUMOR ORGAN ISCHEMIA INFARCT INC GUIDE ROADMAPPING: IMG5449

## 2023-03-21 HISTORY — PX: IR ANGIOGRAM VISCERAL SELECTIVE: IMG657

## 2023-03-21 HISTORY — PX: IR ANGIOGRAM SELECTIVE EACH ADDITIONAL VESSEL: IMG667

## 2023-03-21 LAB — COMPREHENSIVE METABOLIC PANEL
ALT: 23 U/L (ref 0–44)
AST: 51 U/L — ABNORMAL HIGH (ref 15–41)
Albumin: 2.5 g/dL — ABNORMAL LOW (ref 3.5–5.0)
Alkaline Phosphatase: 128 U/L — ABNORMAL HIGH (ref 38–126)
Anion gap: 8 (ref 5–15)
BUN: 22 mg/dL (ref 8–23)
CO2: 23 mmol/L (ref 22–32)
Calcium: 8.6 mg/dL — ABNORMAL LOW (ref 8.9–10.3)
Chloride: 107 mmol/L (ref 98–111)
Creatinine, Ser: 1.38 mg/dL — ABNORMAL HIGH (ref 0.61–1.24)
GFR, Estimated: 56 mL/min — ABNORMAL LOW (ref >60)
Glucose, Bld: 84 mg/dL (ref 70–99)
Potassium: 3.7 mmol/L (ref 3.5–5.1)
Sodium: 138 mmol/L (ref 135–145)
Total Bilirubin: 1.4 mg/dL — ABNORMAL HIGH (ref 0.3–1.2)
Total Protein: 7.1 g/dL (ref 6.5–8.1)

## 2023-03-21 LAB — CBC WITH DIFFERENTIAL/PLATELET
Abs Immature Granulocytes: 0.02 10*3/uL (ref 0.00–0.07)
Basophils Absolute: 0.1 10*3/uL (ref 0.0–0.1)
Basophils Relative: 1 %
Eosinophils Absolute: 0.1 10*3/uL (ref 0.0–0.5)
Eosinophils Relative: 2 %
HCT: 34.4 % — ABNORMAL LOW (ref 39.0–52.0)
Hemoglobin: 10.6 g/dL — ABNORMAL LOW (ref 13.0–17.0)
Immature Granulocytes: 0 %
Lymphocytes Relative: 13 %
Lymphs Abs: 0.8 10*3/uL (ref 0.7–4.0)
MCH: 28.5 pg (ref 26.0–34.0)
MCHC: 30.8 g/dL (ref 30.0–36.0)
MCV: 92.5 fL (ref 80.0–100.0)
Monocytes Absolute: 0.8 10*3/uL (ref 0.1–1.0)
Monocytes Relative: 12 %
Neutro Abs: 4.6 10*3/uL (ref 1.7–7.7)
Neutrophils Relative %: 72 %
Platelets: 248 10*3/uL (ref 150–400)
RBC: 3.72 MIL/uL — ABNORMAL LOW (ref 4.22–5.81)
RDW: 23.1 % — ABNORMAL HIGH (ref 11.5–15.5)
WBC: 6.4 10*3/uL (ref 4.0–10.5)
nRBC: 0 % (ref 0.0–0.2)

## 2023-03-21 LAB — PROTIME-INR
INR: 1.2 (ref 0.8–1.2)
Prothrombin Time: 15.6 seconds — ABNORMAL HIGH (ref 11.4–15.2)

## 2023-03-21 MED ORDER — IOHEXOL 300 MG/ML  SOLN
100.0000 mL | Freq: Once | INTRAMUSCULAR | Status: AC | PRN
  Administered 2023-03-21: 45 mL via INTRA_ARTERIAL

## 2023-03-21 MED ORDER — METRONIDAZOLE 500 MG/100ML IV SOLN
500.0000 mg | Freq: Once | INTRAVENOUS | Status: AC
  Administered 2023-03-21: 500 mg via INTRAVENOUS
  Filled 2023-03-21: qty 100

## 2023-03-21 MED ORDER — LEVOFLOXACIN IN D5W 500 MG/100ML IV SOLN
500.0000 mg | Freq: Once | INTRAVENOUS | Status: AC
  Administered 2023-03-21: 500 mg via INTRAVENOUS
  Filled 2023-03-21: qty 100

## 2023-03-21 MED ORDER — LIDOCAINE HCL (PF) 1 % IJ SOLN
INTRAMUSCULAR | Status: AC
  Filled 2023-03-21: qty 30

## 2023-03-21 MED ORDER — MIDAZOLAM HCL 2 MG/2ML IJ SOLN
INTRAMUSCULAR | Status: AC | PRN
Start: 2023-03-21 — End: 2023-03-21
  Administered 2023-03-21: 1 mg via INTRAVENOUS

## 2023-03-21 MED ORDER — DEXAMETHASONE SODIUM PHOSPHATE 10 MG/ML IJ SOLN
8.0000 mg | Freq: Once | INTRAMUSCULAR | Status: AC
  Administered 2023-03-21: 8 mg via INTRAVENOUS
  Filled 2023-03-21: qty 1

## 2023-03-21 MED ORDER — HEPARIN SODIUM (PORCINE) 1000 UNIT/ML IJ SOLN
INTRAMUSCULAR | Status: AC
  Filled 2023-03-21: qty 10

## 2023-03-21 MED ORDER — PANTOPRAZOLE SODIUM 40 MG IV SOLR
40.0000 mg | Freq: Once | INTRAVENOUS | Status: AC
  Administered 2023-03-21: 40 mg via INTRAVENOUS
  Filled 2023-03-21: qty 10

## 2023-03-21 MED ORDER — NITROGLYCERIN 2 % TD OINT
TOPICAL_OINTMENT | TRANSDERMAL | Status: AC
  Filled 2023-03-21: qty 1

## 2023-03-21 MED ORDER — SODIUM CHLORIDE 0.9 % IV SOLN: INTRAVENOUS | Status: DC

## 2023-03-21 MED ORDER — FENTANYL CITRATE (PF) 100 MCG/2ML IJ SOLN
INTRAMUSCULAR | Status: AC
  Filled 2023-03-21: qty 2

## 2023-03-21 MED ORDER — SODIUM CHLORIDE 0.9 % IV SOLN
8.0000 mg | Freq: Once | INTRAVENOUS | Status: AC
  Administered 2023-03-21: 8 mg via INTRAVENOUS
  Filled 2023-03-21: qty 4

## 2023-03-21 MED ORDER — NITROGLYCERIN 2 % TD OINT
1.0000 [in_us] | TOPICAL_OINTMENT | Freq: Once | TRANSDERMAL | Status: AC
  Administered 2023-03-21: 1 [in_us] via TOPICAL

## 2023-03-21 MED ORDER — MIDAZOLAM HCL 2 MG/2ML IJ SOLN
INTRAMUSCULAR | Status: AC | PRN
  Administered 2023-03-21: 1 mg via INTRAVENOUS

## 2023-03-21 MED ORDER — YTTRIUM 90 INJECTION
23.6900 | INJECTION | Freq: Once | INTRAVENOUS | Status: AC
  Administered 2023-03-21: 23.44 via INTRA_ARTERIAL

## 2023-03-21 MED ORDER — MIDAZOLAM HCL 2 MG/2ML IJ SOLN
INTRAMUSCULAR | Status: AC
  Filled 2023-03-21: qty 2

## 2023-03-21 MED ORDER — VERAPAMIL HCL 2.5 MG/ML IV SOLN
INTRAVENOUS | Status: AC
  Filled 2023-03-21: qty 2

## 2023-03-21 MED ORDER — LIDOCAINE HCL (PF) 1 % IJ SOLN
2.0000 mL | Freq: Once | INTRAMUSCULAR | Status: AC
  Administered 2023-03-21: 2 mL via INTRADERMAL

## 2023-03-21 MED ORDER — FENTANYL CITRATE (PF) 100 MCG/2ML IJ SOLN
INTRAMUSCULAR | Status: AC | PRN
Start: 2023-03-21 — End: 2023-03-21
  Administered 2023-03-21: 50 ug via INTRAVENOUS

## 2023-03-21 MED ORDER — NITROGLYCERIN IN D5W 100-5 MCG/ML-% IV SOLN
INTRAVENOUS | Status: AC
  Filled 2023-03-21: qty 250

## 2023-03-21 MED ORDER — LIDOCAINE-PRILOCAINE 2.5-2.5 % EX CREA
TOPICAL_CREAM | Freq: Once | CUTANEOUS | Status: AC
  Filled 2023-03-21: qty 5

## 2023-03-21 MED ORDER — FENTANYL CITRATE (PF) 100 MCG/2ML IJ SOLN
INTRAMUSCULAR | Status: AC | PRN
  Administered 2023-03-21: 50 ug via INTRAVENOUS

## 2023-03-21 NOTE — Discharge Instructions (Addendum)
Please call Interventional Radiology clinic [phone] with any questions or concerns.  You may remove your dressing and shower tomorrow.    Hepatic Artery Radioembolization, Care After The following information offers guidance on how to care for yourself after your procedure. Your health care provider may also give you more specific instructions. If you have problems or questions, contact your health care provider. What can I expect after the procedure? After the procedure, it is possible to have: A slight fever for 7 to 10 days. This may be accompanied by pain, nausea, or vomiting, which is referred to as post-embolization syndrome. You may be given medicine to help relieve these symptoms. If your fever gets worse, tell your health care provider. Tiredness (fatigue). Loss of appetite. This should gradually improve after about 1 week. Abdominal pain on your right side. Soreness and tenderness in your groin area where the needle and catheter were placed (puncture site). Follow these instructions at home: Puncture site care Follow instructions from your health care provider about how to take care of the puncture site. Make sure you: Wash your hands with soap and water for at least 20 seconds before and after you change your bandage (dressing). If soap and water are not available, use hand sanitizer. Change your dressing as told by your health care provider. Check your puncture site every day for signs of infection. Check for: More redness, swelling, or pain. Fluid or blood. Warmth. Pus or a bad smell. Activity Rest as told by your health care provider. Return to your normal activities as told by your health care provider. Ask your health care provider what activities are safe for you. Avoid sitting for a long time without moving. Get up to take short walks every 1-2 hours. This is important to improve blood flow and breathing. Ask for help if you feel weak or unsteady. If you were given  a sedative during the procedure, it can affect you for several hours. Do not drive or operate machinery until your health care provider says that it is safe. Do not lift anything that is heavier than 10 lb (4.5 kg), or the limit that you are told, until your health care provider says that it is safe. Medicines Take over-the-counter and prescription medicines only as told by your health care provider. Ask your health care provider if the medicine prescribed to you: Requires you to avoid driving or using machinery. Can cause constipation. You may need to take these actions to prevent or treat constipation: Drink enough fluid to keep your urine pale yellow. Take over-the-counter or prescription medicines. Eat foods that are high in fiber, such as beans, whole grains, and fresh fruits and vegetables. Limit foods that are high in fat and processed sugars, such as fried or sweet foods. General instructions Eat frequent, small meals until your appetite returns. Follow instructions from your health care provider about eating or drinking restrictions. Do not take baths, swim, or use a hot tub until your health care provider approves. You may take showers. Wash your puncture site with mild soap and water, and pat the area dry. Wear compression stockings as told by your health care provider. These stockings help to prevent blood clots and reduce swelling in your legs. Keep all follow-up visits. This is important. You may need to have blood tests and imaging tests. Contact a health care provider if: You have any of these signs of infection: More redness, swelling, or pain around your puncture site. Fluid or blood coming from your  puncture site. Warmth coming from your puncture site. Pus or a bad smell coming from your puncture site. You have pain that: Gets worse. Does not get better with medicine. Feels like very bad heartburn. Is in the middle of your abdomen, above your belly button. You have any  signs of infection or liver failure, such as: Your skin or the white parts of your eyes turn yellow (jaundice). The color of your urine changes to dark brown. The color of your stool (feces) changes to light yellow. Your abdominal measurement (girth) increases in a short period of time. You gain more than 5 lb (2.3 kg) in a short period of time. Get help right away if: You have a fever that lasts more than 10 days or is higher than what your health care provider told you to expect. You develop any of the following in your legs: Pain. Swelling. Skin that is cold or pale or turns blue. You have chest pain. You have blood in your vomit, saliva, or stool. You have trouble breathing. These symptoms may represent a serious problem that is an emergency. Do not wait to see if the symptoms will go away. Get medical help right away. Call your local emergency services (911 in the [place] Do not drive yourself to the hospital. Summary After the procedure, it is possible to have a slight fever for up to 7-10 days, tiredness, loss of appetite, abdominal pain on the right side, and groin tenderness where the catheter was placed. Follow instructions from your health care provider about how to take care of the puncture site. Contact a health care provider if you have any signs of infection. Get help right away if you develop pain or swelling in your legs or if your legs feel cool or look pale. This information is not intended to replace advice given to you by your health care provider. Make sure you discuss any questions you have with your health care provider. Document Revised: 07/02/2020 Document Reviewed: 07/02/2020 Else[employer] Patient Education  2022 [employer]      Moderate Conscious Sedation, Adult, Care After This sheet gives you information about how to care for yourself after your procedure. Your health care provider may also give you more specific instructions. If you have problems or  questions, contact your health care provider. What can I expect after the procedure? After the procedure, it is common to have: Sleepiness for several hours. Impaired judgment for several hours. Difficulty with balance. Vomiting if you eat too soon. Follow these instructions at home: For the time period you were told by your health care provider: Rest. Do not participate in activities where you could fall or become injured. Do not drive or use machinery. Do not drink alcohol. Do not take sleeping pills or medicines that cause drowsiness. Do not make important decisions or sign legal documents. Do not take care of children on your own.      Eating and drinking Follow the diet recommended by your health care provider. Drink enough fluid to keep your urine pale yellow. If you vomit: Drink water, juice, or soup when you can drink without vomiting. Make sure you have little or no nausea before eating solid foods.   General instructions Take over-the-counter and prescription medicines only as told by your health care provider. Have a responsible adult stay with you for the time you are told. It is important to have someone help care for you until you are awake and alert. Do not smoke. Keep  all follow-up visits as told by your health care provider. This is important. Contact a health care provider if: You are still sleepy or having trouble with balance after 24 hours. You feel light-headed. You keep feeling nauseous or you keep vomiting. You develop a rash. You have a fever. You have redness or swelling around the IV site. Get help right away if: You have trouble breathing. You have new-onset confusion at home. Summary After the procedure, it is common to feel sleepy, have impaired judgment, or feel nauseous if you eat too soon. Rest after you get home. Know the things you should not do after the procedure. Follow the diet recommended by your health care provider and drink enough  fluid to keep your urine pale yellow. Get help right away if you have trouble breathing or new-onset confusion at home. This information is not intended to replace advice given to you by your health care provider. Make sure you discuss any questions you have with your health care provider. Document Revised: 11/26/2019 Document Reviewed: 06/24/2019 Elsevier Patient Education  2021 [employer]     RADIATION PRECAUTIONS:  For up to a week after your procedure, there will be a small amount of radioactivity near your liver. This is not especially dangerous to other people. However, as told by your health care provider, you should follow these precautions for 7 days: Do not come in close contact with people. Do not sleep in the same bed as someone else. Do not hold children or babies. Do not have contact with pregnant women.  Post Y-90 Radioembolization Discharge Instructions  You have been given a radioactive material during your procedure.  While it is safe for you to be discharged home from the hospital, you need to proceed directly home.    Do not use public transportation, including air travel, lasting more than 2 hours for 1 week.  Avoid crowded public places for 1 week.  Adult visitors should try to avoid close contact with you for 1 week.    Children and pregnant females should not visit or have close contact with you for 1 week.  Items that you touch are not radioactive.  Do not sleep in the same bed as your partner for 1 week, and a condom should be used for sexual activity during the first 24 hours.  Your blood may be radioactive and caution should be used if any bleeding occurs during the recovery period.  Body fluids may be radioactive for 24 hours.  Wash your hands after voiding.  Men should sit to urinate.  Dispose of any soiled materials (flush down toilet or place in trash at home) during the first day.  Drink 6 to 8 glasses of fluids per day for 5 days to hydrate  yourself.  If you need to see a doctor during the first week, you must let them know that you were treated with yttrium-90 microspheres, and will be slightly radioactive.  They can call Interventional Radiology [phone] with any questions.

## 2023-03-21 NOTE — Procedures (Signed)
Interventional Radiology Procedure Note  Procedure: Left hepatic transarterial radioembolization  Findings: Please refer to procedural dictation for full description.  L radial artery access, TR band closure.  Complications: None immediate  Estimated Blood Loss: < 5 mL  Recommendations: TR band protocol release. IR will arrange 2 week outpatient follow up.   Marliss Coots, MD

## 2023-03-22 ENCOUNTER — Other Ambulatory Visit (HOSPITAL_COMMUNITY): Payer: Self-pay | Admitting: Interventional Radiology

## 2023-03-22 DIAGNOSIS — C220 Liver cell carcinoma: Secondary | ICD-10-CM

## 2023-03-24 ENCOUNTER — Ambulatory Visit: Payer: PPO | Admitting: Gastroenterology

## 2023-03-24 ENCOUNTER — Other Ambulatory Visit (HOSPITAL_COMMUNITY): Payer: Self-pay | Admitting: Interventional Radiology

## 2023-03-24 ENCOUNTER — Encounter: Payer: Self-pay | Admitting: Gastroenterology

## 2023-03-24 VITALS — BP 130/76 | HR 109 | Ht 70.0 in | Wt 196.8 lb

## 2023-03-24 DIAGNOSIS — C220 Liver cell carcinoma: Secondary | ICD-10-CM

## 2023-03-24 DIAGNOSIS — R197 Diarrhea, unspecified: Secondary | ICD-10-CM

## 2023-03-24 DIAGNOSIS — K222 Esophageal obstruction: Secondary | ICD-10-CM

## 2023-03-24 DIAGNOSIS — K2100 Gastro-esophageal reflux disease with esophagitis, without bleeding: Secondary | ICD-10-CM

## 2023-03-24 DIAGNOSIS — K5150 Left sided colitis without complications: Secondary | ICD-10-CM

## 2023-03-24 NOTE — Patient Instructions (Addendum)
_______________________________________________________  If your blood pressure at your visit was 140/90 or greater, please contact your primary care physician to follow up on this.  _______________________________________________________  If you are age 68 or older, your body mass index should be between 23-30. Your Body mass index is 28.24 kg/m. If this is out of the aforementioned range listed, please consider follow up with your Primary Care Provider.  If you are age 44 or younger, your body mass index should be between 19-25. Your Body mass index is 28.24 kg/m. If this is out of the aformentioned range listed, please consider follow up with your Primary Care Provider.   ________________________________________________________  The [place] [facility] providers would like to encourage you to use [facility] to communicate with providers for non-urgent requests or questions.  Due to long hold times on the telephone, sending your provider a message by [facility] may be a faster and more efficient way to get a response.  Please allow 48 business hours for a response.  Please remember that this is for non-urgent requests.   We have you schedule for a follow up visit June 25, 2023  _______________________________________________________   It was a pleasure to see you today!  Thank you for trusting me with your gastrointestinal care!

## 2023-03-24 NOTE — Progress Notes (Signed)
Assessment     Left-sided ulcerative colitis, well controlled on Entyvio Severe diarrhea secondary to cabozantinib, resolved Rectal bleeding secondary to rectal ulcer and anal fissure, resolved Multifocal hepatocellular carcinoma, per Dr. Truett Perna Periumbilical abdominal pain, resolved Multifactorial anemia Cholelithiasis, asymptomatic GERD with LA Grade B esophagitis and an esophageal stricture Afib on Eliquis   Recommendations    Continue Entyvio infusions as scheduled  Consider changing to Entyvio subcutaneously q14d at next visit Complete course of diltiazem gel  Continue pantoprazole 40 mg bid and follow antireflux measures Ongoing follow-up with Dr. Truett Perna as planned REV in 3 months   HPI    This is a 67 year old male with left-sided ulcerative colitis and multifocal hepatocellular carcinoma who developed severe diarrhea secondary to cabozantinib which was discontinued in May. His diarrhea has resolved.  For the past 1 month he reports 3 semi-formed BMs/day. His post prandial abdominal pain has resolved.  He discontinued Colestipol, Lomotil and dicyclomine. Rectal bleeding was felt secondary to a rectal ulcer and an anal fissure has resolved.  Flexible sigmoidoscopy with biopsies in May were unremarkable, no evidence of ulcerative colitis activity. He states he feels well except for frequent urination at night.  He underwent Y90 radioembolization of the left hepatic artery on August 9  He has no GI complaints.    Labs / Imaging       Latest Ref Rng & Units 03/21/2023    8:20 AM 03/07/2023    8:19 AM 03/06/2023    8:46 AM  Hepatic Function  Total Protein 6.5 - 8.1 g/dL 7.1  6.3  6.4   Albumin 3.5 - 5.0 g/dL 2.5  2.3  3.0   AST 15 - 41 U/L 51  48  43   ALT 0 - 44 U/L 23  23  18    Alk Phosphatase 38 - 126 U/L 128  167  124   Total Bilirubin 0.3 - 1.2 mg/dL 1.4  1.0  1.1        Latest Ref Rng & Units 03/21/2023    8:20 AM 03/07/2023    8:19 AM 03/06/2023    8:46 AM   CBC  WBC 4.0 - 10.5 K/uL 6.4  7.6  6.9   Hemoglobin 13.0 - 17.0 g/dL 78.2  9.1  9.8   Hematocrit 39.0 - 52.0 % 34.4  29.4  30.3   Platelets 150 - 400 K/uL 248  324  352      IR Angiogram Selective Each Additional Vessel INDICATION: 67 year old male with history of multifocal bilobar hepatocellular carcinoma presenting for left hepatic radioembolization.  EXAM: 1. Ultrasound-guided vascular access of the left radial artery. 2. Selective catheterization and angiography of the celiac, proper hepatic, and left hepatic arteries. 3. Transarterial radioembolization of left hepatic artery  MEDICATIONS: 500 mg Levaquin, 500 mg Flagyl, intravenous. The antibiotics were administered within 1 hour of the procedure  Additional Medications: 3.375 grams IV Zosyn, 20 mg IV Decadron, 40 mg IV Protonix, 4 mg IV Zofran  Y-90 dose: 23.7 mCi  ANESTHESIA/SEDATION: Moderate (conscious) sedation was employed during this procedure. A total of Versed 2 mg and Fentanyl 100 mcg was administered intravenously.  Moderate Sedation Time: 30 minutes. The patient's level of consciousness and vital signs were monitored continuously by radiology nursing throughout the procedure under my direct supervision.  CONTRAST:  45mL OMNIPAQUE IOHEXOL 300 MG/ML  SOLN  FLUOROSCOPY TIME:  Four hundred eighty-nine mGy  COMPLICATIONS: None immediate.  PROCEDURE: Informed consent was obtained from the patient  following explanation of the procedure, risks, benefits and alternatives. The patient understands, agrees and consents for the procedure. All questions were addressed. A time out was performed prior to the initiation of the procedure. Maximal barrier sterile technique utilized including caps, mask, sterile gowns, sterile gloves, large sterile drape, hand hygiene, and Betadine prep.  The left wrist was prepped and draped in standard fashion. Pulse oximeter was attached to the left thumb. Tora Perches test was  performed, grade A. The left radial artery measured 0.22 cm in diameter. Subdermal Local anesthesia was provided at the planned needle entry site with 1% lidocaine. A small skin nick was made. Under direct ultrasound visualization, the left radial artery was punctured with a 21 gauge micropuncture needle. A permanent image was captured and stored in the record. A microwire was placed and exchanged for a 4/5 French slender sheath. The sheath was flushed followed by installation of standard radial cocktail.  A 5 Fr MG1 catheter was advanced into the celiac artery. Celiac angiography demonstrates conventional anatomy. A Progreat Omega microcatheter and fathom 16 microwire were used to selectively catheterize the left hepatic artery. Selective angiography demonstrates the known masses in the left hepatic lobe, which is the same mass visualized on a recent planning angiogram. These findings are consistent with multifocal hepatocellular carcinoma. There is no evidence of non-target extra-hepatic branches.  Working with the authorized user Dr. Genevive Bi, the previously prescribed Y90 dose was injected into the arterioles supplying the mass. Flow to the lesion was slowed but not occluded. The catheters were withdrawn and stowed by the radiation safety team.  All wires and catheters were removed. Hemostasis was achieved using TR band. There were no immediate complications. Peripheral pulses were unchanged. The patient was transferred to the recovery area in stable condition.  IMPRESSION: Technically successful transarterial radioembolization of left hepatic artery.  PLAN: Return in 1 month for right hepatic artery transarterial radioembolization.  Marliss Coots, MD  [facility]  [facility]iology  Electronically Signed   By: Marliss Coots M.D.   On: 03/21/2023 14:39 IR Angiogram Visceral Selective INDICATION: 67 year old male with  history of multifocal bilobar hepatocellular carcinoma presenting for left hepatic radioembolization.  EXAM: 1. Ultrasound-guided vascular access of the left radial artery. 2. Selective catheterization and angiography of the celiac, proper hepatic, and left hepatic arteries. 3. Transarterial radioembolization of left hepatic artery  MEDICATIONS: 500 mg Levaquin, 500 mg Flagyl, intravenous. The antibiotics were administered within 1 hour of the procedure  Additional Medications: 3.375 grams IV Zosyn, 20 mg IV Decadron, 40 mg IV Protonix, 4 mg IV Zofran  Y-90 dose: 23.7 mCi  ANESTHESIA/SEDATION: Moderate (conscious) sedation was employed during this procedure. A total of Versed 2 mg and Fentanyl 100 mcg was administered intravenously.  Moderate Sedation Time: 30 minutes. The patient's level of consciousness and vital signs were monitored continuously by radiology nursing throughout the procedure under my direct supervision.  CONTRAST:  45mL OMNIPAQUE IOHEXOL 300 MG/ML  SOLN  FLUOROSCOPY TIME:  Four hundred eighty-nine mGy  COMPLICATIONS: None immediate.  PROCEDURE: Informed consent was obtained from the patient following explanation of the procedure, risks, benefits and alternatives. The patient understands, agrees and consents for the procedure. All questions were addressed. A time out was performed prior to the initiation of the procedure. Maximal barrier sterile technique utilized including caps, mask, sterile gowns, sterile gloves, large sterile drape, hand hygiene, and Betadine prep.  The left wrist was prepped and draped in standard fashion. Pulse oximeter was attached  to the left thumb. Tora Perches test was performed, grade A. The left radial artery measured 0.22 cm in diameter. Subdermal Local anesthesia was provided at the planned needle entry site with 1% lidocaine. A small skin nick was made. Under direct ultrasound visualization, the left radial artery was  punctured with a 21 gauge micropuncture needle. A permanent image was captured and stored in the record. A microwire was placed and exchanged for a 4/5 French slender sheath. The sheath was flushed followed by installation of standard radial cocktail.  A 5 Fr MG1 catheter was advanced into the celiac artery. Celiac angiography demonstrates conventional anatomy. A Progreat Omega microcatheter and fathom 16 microwire were used to selectively catheterize the left hepatic artery. Selective angiography demonstrates the known masses in the left hepatic lobe, which is the same mass visualized on a recent planning angiogram. These findings are consistent with multifocal hepatocellular carcinoma. There is no evidence of non-target extra-hepatic branches.  Working with the authorized user Dr. Genevive Bi, the previously prescribed Y90 dose was injected into the arterioles supplying the mass. Flow to the lesion was slowed but not occluded. The catheters were withdrawn and stowed by the radiation safety team.  All wires and catheters were removed. Hemostasis was achieved using TR band. There were no immediate complications. Peripheral pulses were unchanged. The patient was transferred to the recovery area in stable condition.  IMPRESSION: Technically successful transarterial radioembolization of left hepatic artery.  PLAN: Return in 1 month for right hepatic artery transarterial radioembolization.  Marliss Coots, MD  [facility]  [facility] Radiology  Electronically Signed   By: Marliss Coots M.D.   On: 03/21/2023 14:39 IR EMBO TUMOR ORGAN ISCHEMIA INFARCT INC GUIDE ROADMAPPING INDICATION: 67 year old male with history of multifocal bilobar hepatocellular carcinoma presenting for left hepatic radioembolization.  EXAM: 1. Ultrasound-guided vascular access of the left radial artery. 2. Selective catheterization and angiography of the celiac,  proper hepatic, and left hepatic arteries. 3. Transarterial radioembolization of left hepatic artery  MEDICATIONS: 500 mg Levaquin, 500 mg Flagyl, intravenous. The antibiotics were administered within 1 hour of the procedure  Additional Medications: 3.375 grams IV Zosyn, 20 mg IV Decadron, 40 mg IV Protonix, 4 mg IV Zofran  Y-90 dose: 23.7 mCi  ANESTHESIA/SEDATION: Moderate (conscious) sedation was employed during this procedure. A total of Versed 2 mg and Fentanyl 100 mcg was administered intravenously.  Moderate Sedation Time: 30 minutes. The patient's level of consciousness and vital signs were monitored continuously by radiology nursing throughout the procedure under my direct supervision.  CONTRAST:  45mL OMNIPAQUE IOHEXOL 300 MG/ML  SOLN  FLUOROSCOPY TIME:  Four hundred eighty-nine mGy  COMPLICATIONS: None immediate.  PROCEDURE: Informed consent was obtained from the patient following explanation of the procedure, risks, benefits and alternatives. The patient understands, agrees and consents for the procedure. All questions were addressed. A time out was performed prior to the initiation of the procedure. Maximal barrier sterile technique utilized including caps, mask, sterile gowns, sterile gloves, large sterile drape, hand hygiene, and Betadine prep.  The left wrist was prepped and draped in standard fashion. Pulse oximeter was attached to the left thumb. Tora Perches test was performed, grade A. The left radial artery measured 0.22 cm in diameter. Subdermal Local anesthesia was provided at the planned needle entry site with 1% lidocaine. A small skin nick was made. Under direct ultrasound visualization, the left radial artery was punctured with a 21 gauge micropuncture needle. A permanent image was captured and stored in the  record. A microwire was placed and exchanged for a 4/5 French slender sheath. The sheath was flushed followed by installation of standard  radial cocktail.  A 5 Fr MG1 catheter was advanced into the celiac artery. Celiac angiography demonstrates conventional anatomy. A Progreat Omega microcatheter and fathom 16 microwire were used to selectively catheterize the left hepatic artery. Selective angiography demonstrates the known masses in the left hepatic lobe, which is the same mass visualized on a recent planning angiogram. These findings are consistent with multifocal hepatocellular carcinoma. There is no evidence of non-target extra-hepatic branches.  Working with the authorized user Dr. Genevive Bi, the previously prescribed Y90 dose was injected into the arterioles supplying the mass. Flow to the lesion was slowed but not occluded. The catheters were withdrawn and stowed by the radiation safety team.  All wires and catheters were removed. Hemostasis was achieved using TR band. There were no immediate complications. Peripheral pulses were unchanged. The patient was transferred to the recovery area in stable condition.  IMPRESSION: Technically successful transarterial radioembolization of left hepatic artery.  PLAN: Return in 1 month for right hepatic artery transarterial radioembolization.  Marliss Coots, MD  [facility]  [facility] Radiology  Electronically Signed   By: Marliss Coots M.D.   On: 03/21/2023 14:39 IR US Guide Vasc Access Left INDICATION: 67 year old male with history of multifocal bilobar hepatocellular carcinoma presenting for left hepatic radioembolization.  EXAM: 1. Ultrasound-guided vascular access of the left radial artery. 2. Selective catheterization and angiography of the celiac, proper hepatic, and left hepatic arteries. 3. Transarterial radioembolization of left hepatic artery  MEDICATIONS: 500 mg Levaquin, 500 mg Flagyl, intravenous. The antibiotics were administered within 1 hour of the procedure  Additional Medications: 3.375  grams IV Zosyn, 20 mg IV Decadron, 40 mg IV Protonix, 4 mg IV Zofran  Y-90 dose: 23.7 mCi  ANESTHESIA/SEDATION: Moderate (conscious) sedation was employed during this procedure. A total of Versed 2 mg and Fentanyl 100 mcg was administered intravenously.  Moderate Sedation Time: 30 minutes. The patient's level of consciousness and vital signs were monitored continuously by radiology nursing throughout the procedure under my direct supervision.  CONTRAST:  45mL OMNIPAQUE IOHEXOL 300 MG/ML  SOLN  FLUOROSCOPY TIME:  Four hundred eighty-nine mGy  COMPLICATIONS: None immediate.  PROCEDURE: Informed consent was obtained from the patient following explanation of the procedure, risks, benefits and alternatives. The patient understands, agrees and consents for the procedure. All questions were addressed. A time out was performed prior to the initiation of the procedure. Maximal barrier sterile technique utilized including caps, mask, sterile gowns, sterile gloves, large sterile drape, hand hygiene, and Betadine prep.  The left wrist was prepped and draped in standard fashion. Pulse oximeter was attached to the left thumb. Tora Perches test was performed, grade A. The left radial artery measured 0.22 cm in diameter. Subdermal Local anesthesia was provided at the planned needle entry site with 1% lidocaine. A small skin nick was made. Under direct ultrasound visualization, the left radial artery was punctured with a 21 gauge micropuncture needle. A permanent image was captured and stored in the record. A microwire was placed and exchanged for a 4/5 French slender sheath. The sheath was flushed followed by installation of standard radial cocktail.  A 5 Fr MG1 catheter was advanced into the celiac artery. Celiac angiography demonstrates conventional anatomy. A Progreat Omega microcatheter and fathom 16 microwire were used to selectively catheterize the left hepatic artery. Selective  angiography demonstrates the known masses in the  left hepatic lobe, which is the same mass visualized on a recent planning angiogram. These findings are consistent with multifocal hepatocellular carcinoma. There is no evidence of non-target extra-hepatic branches.  Working with the authorized user Dr. Genevive Bi, the previously prescribed Y90 dose was injected into the arterioles supplying the mass. Flow to the lesion was slowed but not occluded. The catheters were withdrawn and stowed by the radiation safety team.  All wires and catheters were removed. Hemostasis was achieved using TR band. There were no immediate complications. Peripheral pulses were unchanged. The patient was transferred to the recovery area in stable condition.  IMPRESSION: Technically successful transarterial radioembolization of left hepatic artery.  PLAN: Return in 1 month for right hepatic artery transarterial radioembolization.  Marliss Coots, MD  [facility]  [facility] Radiology  Electronically Signed   By: Marliss Coots M.D.   On: 03/21/2023 14:39 NM Y90 LIVER SPECT THERAPY CLINICAL DATA:  Multifocal hepatocellular carcinoma. Radioembolization of the LEFT hepatic lobe with yttrium 90 microspheres. Prior radio embolization of the RIGHT hepatic lobe mass with 32 millicuries Yttrium 90 microspheres November 2022. Subsequent progression on recent MRI.  EXAM: NUCLEAR MEDICINE SPECIAL MED RAD PHYSICS CONS; NUCLEAR MEDICINE RADIO PHARM THERAPY INTRA ARTERIAL; NUCLEAR MEDICINE TREATMENT PROCEDURE; NUCLEAR MEDICINE LIVER SCAN  TECHNIQUE: In conjunction with the interventional radiologist a Y- Microsphere dose was calculated utilizing body surface area formulation. Calculated dose equal 23.9 mCi. Pre therapy MAA liver SPECT scan and CTA were evaluated. Utilizing a microcatheter system, the hepatic artery was selected and Y-90 microspheres were delivered  in fractionated aliquots. Radiopharmaceutical was delivered by the interventional radiologist and nuclear radiologist.  The patient tolerated procedure well. No adverse effects were noted.  Bremsstrahlung planar and SPECT imaging of the abdomen following intrahepatic arterial delivery of Y-90 microsphere was performed.  RADIOPHARMACEUTICALS:  23.7 millicuries yttrium 90 microspheres  COMPARISON:  MAA scan 03/07/2023, MRI 12/27/2022, angiography 03/07/2023  FINDINGS: Y - 90 microspheres therapy as above. First therapy the LEFT hepatic lobe.  Bremsstrahlung planar and SPECT imaging of the abdomen following intrahepatic arterial delivery of Y-85microsphere demonstrates radioactivity localized to the LEFT hepatic lobe. No evidence of extrahepatic activity.  IMPRESSION: Successful Y - 90 microsphere delivery for treatment of unresectable liver metastasis. First therapy to the LEFT lobe. Prior lobar microsphere therapy to the RIGHT hepatic lobe. See clinical data  Bremssstrahlung scan demonstrates activity localized to LEFT hepatic lobe with no extrahepatic activity identified.  Electronically Signed   By: Genevive Bi M.D.   On: 03/21/2023 12:22   Current Medications, Allergies, Past Medical History, Past Surgical History, Family History and Social History were reviewed in [employer] record.   Physical Exam: General: Well developed, well nourished, no acute distress Head: Normocephalic and atraumatic Eyes: Sclerae anicteric, EOMI Ears: Normal auditory acuity Mouth: No deformities or lesions noted Lungs: Clear throughout to auscultation Heart: Regular rate and rhythm; No murmurs, rubs or bruits Abdomen: Soft, non tender and non distended. No masses, hepatosplenomegaly or hernias noted. Normal Bowel sounds Rectal: Musculoskeletal: Symmetrical with no gross deformities  Pulses:  Normal pulses noted Extremities: No edema or deformities  noted Neurological: Alert oriented x 4, grossly nonfocal Psychological:  Alert and cooperative. Normal mood and affect    T. Russella Dar, MD 03/24/2023, 8:25 AM

## 2023-03-25 ENCOUNTER — Ambulatory Visit (HOSPITAL_COMMUNITY): Payer: PPO | Attending: Cardiovascular Disease

## 2023-03-25 DIAGNOSIS — I7121 Aneurysm of the ascending aorta, without rupture: Secondary | ICD-10-CM | POA: Diagnosis not present

## 2023-03-25 DIAGNOSIS — I480 Paroxysmal atrial fibrillation: Secondary | ICD-10-CM | POA: Diagnosis not present

## 2023-03-25 DIAGNOSIS — E782 Mixed hyperlipidemia: Secondary | ICD-10-CM | POA: Diagnosis not present

## 2023-03-25 DIAGNOSIS — I10 Essential (primary) hypertension: Secondary | ICD-10-CM | POA: Diagnosis not present

## 2023-03-25 DIAGNOSIS — I2510 Atherosclerotic heart disease of native coronary artery without angina pectoris: Secondary | ICD-10-CM | POA: Insufficient documentation

## 2023-03-25 DIAGNOSIS — Q231 Congenital insufficiency of aortic valve: Secondary | ICD-10-CM | POA: Insufficient documentation

## 2023-03-25 DIAGNOSIS — I739 Peripheral vascular disease, unspecified: Secondary | ICD-10-CM | POA: Insufficient documentation

## 2023-03-25 LAB — ECHOCARDIOGRAM COMPLETE
Area-P 1/2: 4.28 cm2
S' Lateral: 3.6 cm

## 2023-03-31 ENCOUNTER — Other Ambulatory Visit: Payer: Self-pay | Admitting: Family Medicine

## 2023-03-31 ENCOUNTER — Telehealth: Payer: Self-pay | Admitting: Family Medicine

## 2023-03-31 ENCOUNTER — Telehealth: Payer: Self-pay

## 2023-03-31 MED ORDER — MIRABEGRON ER 25 MG PO TB24: 25.0000 mg | ORAL_TABLET | Freq: Every day | ORAL | 3 refills | Status: DC

## 2023-03-31 NOTE — Telephone Encounter (Signed)
Prescription Request  03/31/2023  LOV: 02/27/2023  What is the name of the medication or equipment?   pantoprazole (PROTONIX) 40 MG tablet  **New script requested**  Have you contacted your pharmacy to request a refill? Yes   Which pharmacy would you like this sent to?  [id number] [place], [place] - [address] [place] [address] Phone: [phone] Fax: [phone]    Patient notified that their request is being sent to the clinical staff for review and that they should receive a response within 2 business days.   Please advise pharmacist.

## 2023-03-31 NOTE — Telephone Encounter (Signed)
Pt came in wanting to speak with pcp/nurse. Pt states that he is on a med that is supposed to help him with his urine frequency. Pt states that this med is not working for him. Pt would like to speak with nurse/pcp to see if there is some other med that he could take that may help. Please advise.  Cb#: [phone]

## 2023-04-01 ENCOUNTER — Other Ambulatory Visit: Payer: Self-pay

## 2023-04-03 ENCOUNTER — Encounter: Payer: Self-pay | Admitting: Cardiology

## 2023-04-03 ENCOUNTER — Ambulatory Visit: Payer: PPO | Admitting: Cardiology

## 2023-04-03 VITALS — BP 110/66 | HR 69 | Ht 70.0 in | Wt 205.6 lb

## 2023-04-03 DIAGNOSIS — I2510 Atherosclerotic heart disease of native coronary artery without angina pectoris: Secondary | ICD-10-CM

## 2023-04-03 DIAGNOSIS — I4819 Other persistent atrial fibrillation: Secondary | ICD-10-CM

## 2023-04-03 DIAGNOSIS — D6869 Other thrombophilia: Secondary | ICD-10-CM

## 2023-04-03 DIAGNOSIS — I10 Essential (primary) hypertension: Secondary | ICD-10-CM

## 2023-04-03 NOTE — Progress Notes (Signed)
  Electrophysiology Office Note:   Date:  04/03/2023  ID:  Jacaree, Gulledge [birth year], MRN [id number]  Primary Cardiologist: Nanetta Batty, MD Electrophysiologist: Regan Lemming, MD      History of Present Illness:   Matthew Chen is a 68 y.o. male with h/o atrial fibrillation seen today for routine electrophysiology followup.  Since last being seen in our clinic the patient reports fatigue.  Fatigue started this week.  He is getting radiation therapy for hepatocellular carcinoma and recently received therapy.  He was told that he may feel fatigued afterwards.  He remains in normal rhythm.  His edema is improving.  He did have an echo that showed a reduction mildly in his ejection fraction and has follow-up with primary cardiology.  he denies chest pain, palpitations, dyspnea, PND, orthopnea, nausea, vomiting, dizziness, syncope, edema, weight gain, or early satiety.   Review of systems complete and found to be negative unless listed in HPI.   EP Information / Studies Reviewed:    EKG is ordered today. Personal review as below.  EKG Interpretation Date/Time:  Thursday April 03 2023 11:31:37 EDT Ventricular Rate:  69 PR Interval:  172 QRS Duration:  86 QT Interval:  454 QTC Calculation: 486 R Axis:   57  Text Interpretation: Normal sinus rhythm Prolonged QT When compared with ECG of 02-Jan-2023 18:37, Premature atrial complexes are no longer Present Questionable change in QRS axis T wave amplitude has decreased in Anterior leads Confirmed by Kaydyn Sayas ([id number]) on 04/03/2023 11:37:48 AM     Risk Assessment/Calculations:    CHA2DS2-VASc Score = 4   This indicates a 4.8% annual risk of stroke. The patient's score is based upon: CHF History: 0 HTN History: 1 Diabetes History: 1 Stroke History: 0 Vascular Disease History: 1 Age Score: 1 Gender Score: 0             Physical Exam:   VS:  BP 110/66 (BP Location: Left Arm, Patient Position: Sitting, Cuff Size: Large)    Pulse 69   Ht 5\' 10"  (1.778 m)   Wt 205 lb 9.6 oz (93.3 kg)   SpO2 98%   BMI 29.50 kg/m    Wt Readings from Last 3 Encounters:  04/03/23 205 lb 9.6 oz (93.3 kg)  03/24/23 196 lb 12.8 oz (89.3 kg)  03/21/23 210 lb (95.3 kg)     GEN: Well nourished, well developed in no acute distress NECK: No JVD; No carotid bruits CARDIAC: Regular rate and rhythm, no murmurs, rubs, gallops RESPIRATORY:  Clear to auscultation without rales, wheezing or rhonchi  ABDOMEN: Soft, non-tender, non-distended EXTREMITIES:  No edema; No deformity   ASSESSMENT AND PLAN:    1.  Persistent atrial fibrillation: Currently on amiodarone and Eliquis.  Remains in sinus rhythm.  Unfortunately, his LFTs are elevated on amiodarone.  He is undergoing therapy for hepatocellular carcinoma.  Yeraldy Spike recheck in 3 months, and if they remain elevated, may need to discuss with his oncologist.  2.  Secondary hypercoagulable state: Currently on Eliquis for atrial fibrillation  3.  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  Has switch medications to allow for more antiarrhythmic options  4.  Coronary artery disease: No current chest pain.  Plan per primary cardiology.  5.  Peripheral arterial disease: Plan per primary cardiology  6.  Thoracic aortic aneurysm: Stable on most recent echo  7.  Hypertension: Currently well-controlled  Follow up with EP APP in 6 months  Signed, Thurza Kwiecinski Jorja Loa, MD

## 2023-04-07 ENCOUNTER — Encounter (HOSPITAL_COMMUNITY): Payer: Self-pay | Admitting: Internal Medicine

## 2023-04-07 ENCOUNTER — Inpatient Hospital Stay: Payer: PPO | Attending: Oncology

## 2023-04-07 ENCOUNTER — Inpatient Hospital Stay (HOSPITAL_COMMUNITY)
Admission: AD | Admit: 2023-04-07 | Discharge: 2023-04-11 | DRG: 291 | Disposition: A | Discharge status: Home / Self Care | Payer: PPO | Source: Ambulatory Visit | Attending: Internal Medicine | Admitting: Internal Medicine

## 2023-04-07 ENCOUNTER — Encounter (HOSPITAL_COMMUNITY): Payer: Self-pay

## 2023-04-07 ENCOUNTER — Observation Stay (HOSPITAL_COMMUNITY): Payer: PPO

## 2023-04-07 ENCOUNTER — Inpatient Hospital Stay: Payer: PPO | Admitting: Nurse Practitioner

## 2023-04-07 ENCOUNTER — Other Ambulatory Visit: Payer: Self-pay

## 2023-04-07 VITALS — BP 114/69 | HR 63 | Temp 98.1°F | Resp 20 | Wt 212.6 lb

## 2023-04-07 DIAGNOSIS — I5023 Acute on chronic systolic (congestive) heart failure: Secondary | ICD-10-CM | POA: Diagnosis present

## 2023-04-07 DIAGNOSIS — I2510 Atherosclerotic heart disease of native coronary artery without angina pectoris: Secondary | ICD-10-CM | POA: Insufficient documentation

## 2023-04-07 DIAGNOSIS — I480 Paroxysmal atrial fibrillation: Secondary | ICD-10-CM | POA: Diagnosis present

## 2023-04-07 DIAGNOSIS — C220 Liver cell carcinoma: Secondary | ICD-10-CM | POA: Insufficient documentation

## 2023-04-07 DIAGNOSIS — Z87891 Personal history of nicotine dependence: Secondary | ICD-10-CM

## 2023-04-07 DIAGNOSIS — Z8719 Personal history of other diseases of the digestive system: Secondary | ICD-10-CM

## 2023-04-07 DIAGNOSIS — D631 Anemia in chronic kidney disease: Secondary | ICD-10-CM | POA: Insufficient documentation

## 2023-04-07 DIAGNOSIS — I4819 Other persistent atrial fibrillation: Secondary | ICD-10-CM | POA: Diagnosis present

## 2023-04-07 DIAGNOSIS — I2584 Coronary atherosclerosis due to calcified coronary lesion: Secondary | ICD-10-CM | POA: Diagnosis present

## 2023-04-07 DIAGNOSIS — E1151 Type 2 diabetes mellitus with diabetic peripheral angiopathy without gangrene: Secondary | ICD-10-CM | POA: Diagnosis present

## 2023-04-07 DIAGNOSIS — N189 Chronic kidney disease, unspecified: Secondary | ICD-10-CM | POA: Insufficient documentation

## 2023-04-07 DIAGNOSIS — R310 Gross hematuria: Secondary | ICD-10-CM | POA: Diagnosis present

## 2023-04-07 DIAGNOSIS — R0600 Dyspnea, unspecified: Secondary | ICD-10-CM

## 2023-04-07 DIAGNOSIS — I739 Peripheral vascular disease, unspecified: Secondary | ICD-10-CM | POA: Insufficient documentation

## 2023-04-07 DIAGNOSIS — I129 Hypertensive chronic kidney disease with stage 1 through stage 4 chronic kidney disease, or unspecified chronic kidney disease: Secondary | ICD-10-CM | POA: Insufficient documentation

## 2023-04-07 DIAGNOSIS — E1122 Type 2 diabetes mellitus with diabetic chronic kidney disease: Secondary | ICD-10-CM | POA: Diagnosis present

## 2023-04-07 DIAGNOSIS — Z95820 Peripheral vascular angioplasty status with implants and grafts: Secondary | ICD-10-CM

## 2023-04-07 DIAGNOSIS — E876 Hypokalemia: Secondary | ICD-10-CM | POA: Diagnosis present

## 2023-04-07 DIAGNOSIS — Z79899 Other long term (current) drug therapy: Secondary | ICD-10-CM

## 2023-04-07 DIAGNOSIS — Z800 Family history of malignant neoplasm of digestive organs: Secondary | ICD-10-CM

## 2023-04-07 DIAGNOSIS — K5190 Ulcerative colitis, unspecified, without complications: Secondary | ICD-10-CM | POA: Insufficient documentation

## 2023-04-07 DIAGNOSIS — R5383 Other fatigue: Secondary | ICD-10-CM | POA: Insufficient documentation

## 2023-04-07 DIAGNOSIS — N179 Acute kidney failure, unspecified: Secondary | ICD-10-CM | POA: Diagnosis present

## 2023-04-07 DIAGNOSIS — Z923 Personal history of irradiation: Secondary | ICD-10-CM

## 2023-04-07 DIAGNOSIS — Q231 Congenital insufficiency of aortic valve: Secondary | ICD-10-CM

## 2023-04-07 DIAGNOSIS — Z7984 Long term (current) use of oral hypoglycemic drugs: Secondary | ICD-10-CM

## 2023-04-07 DIAGNOSIS — E871 Hypo-osmolality and hyponatremia: Secondary | ICD-10-CM | POA: Diagnosis present

## 2023-04-07 DIAGNOSIS — T501X6A Underdosing of loop [high-ceiling] diuretics, initial encounter: Secondary | ICD-10-CM | POA: Diagnosis present

## 2023-04-07 DIAGNOSIS — E860 Dehydration: Secondary | ICD-10-CM | POA: Diagnosis present

## 2023-04-07 DIAGNOSIS — N182 Chronic kidney disease, stage 2 (mild): Secondary | ICD-10-CM | POA: Diagnosis present

## 2023-04-07 DIAGNOSIS — Z8249 Family history of ischemic heart disease and other diseases of the circulatory system: Secondary | ICD-10-CM

## 2023-04-07 DIAGNOSIS — Z83719 Family history of colon polyps, unspecified: Secondary | ICD-10-CM

## 2023-04-07 DIAGNOSIS — I340 Nonrheumatic mitral (valve) insufficiency: Secondary | ICD-10-CM | POA: Diagnosis present

## 2023-04-07 DIAGNOSIS — Z6830 Body mass index (BMI) 30.0-30.9, adult: Secondary | ICD-10-CM

## 2023-04-07 DIAGNOSIS — E8809 Other disorders of plasma-protein metabolism, not elsewhere classified: Secondary | ICD-10-CM | POA: Diagnosis present

## 2023-04-07 DIAGNOSIS — Z91128 Patient's intentional underdosing of medication regimen for other reason: Secondary | ICD-10-CM

## 2023-04-07 DIAGNOSIS — Z880 Allergy status to penicillin: Secondary | ICD-10-CM

## 2023-04-07 DIAGNOSIS — Z8679 Personal history of other diseases of the circulatory system: Secondary | ICD-10-CM

## 2023-04-07 DIAGNOSIS — Z7901 Long term (current) use of anticoagulants: Secondary | ICD-10-CM

## 2023-04-07 DIAGNOSIS — R17 Unspecified jaundice: Secondary | ICD-10-CM | POA: Diagnosis present

## 2023-04-07 DIAGNOSIS — I70202 Unspecified atherosclerosis of native arteries of extremities, left leg: Secondary | ICD-10-CM | POA: Diagnosis present

## 2023-04-07 DIAGNOSIS — I130 Hypertensive heart and chronic kidney disease with heart failure and stage 1 through stage 4 chronic kidney disease, or unspecified chronic kidney disease: Principal | ICD-10-CM | POA: Diagnosis present

## 2023-04-07 DIAGNOSIS — I10 Essential (primary) hypertension: Secondary | ICD-10-CM | POA: Diagnosis present

## 2023-04-07 DIAGNOSIS — Z833 Family history of diabetes mellitus: Secondary | ICD-10-CM

## 2023-04-07 DIAGNOSIS — E46 Unspecified protein-calorie malnutrition: Secondary | ICD-10-CM | POA: Diagnosis present

## 2023-04-07 DIAGNOSIS — Z7982 Long term (current) use of aspirin: Secondary | ICD-10-CM

## 2023-04-07 DIAGNOSIS — N183 Chronic kidney disease, stage 3 unspecified: Secondary | ICD-10-CM | POA: Diagnosis present

## 2023-04-07 DIAGNOSIS — E785 Hyperlipidemia, unspecified: Secondary | ICD-10-CM | POA: Diagnosis present

## 2023-04-07 DIAGNOSIS — Z86010 Personal history of colonic polyps: Secondary | ICD-10-CM

## 2023-04-07 DIAGNOSIS — E669 Obesity, unspecified: Secondary | ICD-10-CM | POA: Diagnosis present

## 2023-04-07 LAB — CBC WITH DIFFERENTIAL (CANCER CENTER ONLY)
Abs Immature Granulocytes: 0.07 10*3/uL (ref 0.00–0.07)
Basophils Absolute: 0 10*3/uL (ref 0.0–0.1)
Basophils Relative: 0 %
Eosinophils Absolute: 0.1 10*3/uL (ref 0.0–0.5)
Eosinophils Relative: 1 %
HCT: 33.5 % — ABNORMAL LOW (ref 39.0–52.0)
Hemoglobin: 10.4 g/dL — ABNORMAL LOW (ref 13.0–17.0)
Immature Granulocytes: 1 %
Lymphocytes Relative: 3 %
Lymphs Abs: 0.4 10*3/uL — ABNORMAL LOW (ref 0.7–4.0)
MCH: 27.8 pg (ref 26.0–34.0)
MCHC: 31 g/dL (ref 30.0–36.0)
MCV: 89.6 fL (ref 80.0–100.0)
Monocytes Absolute: 1.3 10*3/uL — ABNORMAL HIGH (ref 0.1–1.0)
Monocytes Relative: 11 %
Neutro Abs: 10 10*3/uL — ABNORMAL HIGH (ref 1.7–7.7)
Neutrophils Relative %: 84 %
Platelet Count: 345 10*3/uL (ref 150–400)
RBC: 3.74 MIL/uL — ABNORMAL LOW (ref 4.22–5.81)
RDW: 19.9 % — ABNORMAL HIGH (ref 11.5–15.5)
WBC Count: 11.8 10*3/uL — ABNORMAL HIGH (ref 4.0–10.5)
nRBC: 0 % (ref 0.0–0.2)

## 2023-04-07 LAB — CMP (CANCER CENTER ONLY)
ALT: 27 U/L (ref 0–44)
AST: 144 U/L — ABNORMAL HIGH (ref 15–41)
Albumin: 2.6 g/dL — ABNORMAL LOW (ref 3.5–5.0)
Alkaline Phosphatase: 207 U/L — ABNORMAL HIGH (ref 38–126)
Anion gap: 8 (ref 5–15)
BUN: 20 mg/dL (ref 8–23)
CO2: 25 mmol/L (ref 22–32)
Calcium: 8.1 mg/dL — ABNORMAL LOW (ref 8.9–10.3)
Chloride: 101 mmol/L (ref 98–111)
Creatinine: 1.42 mg/dL — ABNORMAL HIGH (ref 0.61–1.24)
GFR, Estimated: 54 mL/min — ABNORMAL LOW (ref >60)
Glucose, Bld: 153 mg/dL — ABNORMAL HIGH (ref 70–99)
Potassium: 4.5 mmol/L (ref 3.5–5.1)
Sodium: 134 mmol/L — ABNORMAL LOW (ref 135–145)
Total Bilirubin: 3.9 mg/dL (ref 0.3–1.2)
Total Protein: 5.8 g/dL — ABNORMAL LOW (ref 6.5–8.1)

## 2023-04-07 LAB — URINALYSIS, ROUTINE W REFLEX MICROSCOPIC
Bilirubin Urine: NEGATIVE
Glucose, UA: >=500 mg/dL — AB
Ketones, ur: NEGATIVE mg/dL
Leukocytes,Ua: NEGATIVE
Nitrite: NEGATIVE
Protein, ur: NEGATIVE mg/dL
RBC / HPF: >50 RBC/hpf (ref 0–5)
Specific Gravity, Urine: 1.003 — ABNORMAL LOW (ref 1.005–1.030)
pH: 5 (ref 5.0–8.0)

## 2023-04-07 LAB — GLUCOSE, CAPILLARY
Glucose-Capillary: 110 mg/dL — ABNORMAL HIGH (ref 70–99)
Glucose-Capillary: 137 mg/dL — ABNORMAL HIGH (ref 70–99)

## 2023-04-07 LAB — SAMPLE TO BLOOD BANK

## 2023-04-07 LAB — BRAIN NATRIURETIC PEPTIDE: B Natriuretic Peptide: 1374.1 pg/mL — ABNORMAL HIGH (ref 0.0–100.0)

## 2023-04-07 MED ORDER — LINAGLIPTIN 5 MG PO TABS
5.0000 mg | ORAL_TABLET | Freq: Every day | ORAL | Status: DC
  Administered 2023-04-08 – 2023-04-11 (×4): 5 mg via ORAL
  Filled 2023-04-07 (×4): qty 1

## 2023-04-07 MED ORDER — FUROSEMIDE 10 MG/ML IJ SOLN
40.0000 mg | Freq: Two times a day (BID) | INTRAMUSCULAR | Status: DC
  Administered 2023-04-08: 40 mg via INTRAVENOUS
  Filled 2023-04-07: qty 4

## 2023-04-07 MED ORDER — ONDANSETRON HCL 4 MG PO TABS: 4.0000 mg | ORAL_TABLET | Freq: Four times a day (QID) | ORAL | Status: DC | PRN

## 2023-04-07 MED ORDER — ACETAMINOPHEN 325 MG PO TABS: 650.0000 mg | ORAL_TABLET | Freq: Four times a day (QID) | ORAL | Status: DC | PRN

## 2023-04-07 MED ORDER — FUROSEMIDE 10 MG/ML IJ SOLN
40.0000 mg | Freq: Once | INTRAMUSCULAR | Status: AC
  Administered 2023-04-07: 40 mg via INTRAVENOUS
  Filled 2023-04-07: qty 4

## 2023-04-07 MED ORDER — PROCHLORPERAZINE EDISYLATE 10 MG/2ML IJ SOLN: 10.0000 mg | Freq: Four times a day (QID) | INTRAMUSCULAR | Status: DC | PRN

## 2023-04-07 MED ORDER — ACETAMINOPHEN 650 MG RE SUPP: 650.0000 mg | Freq: Four times a day (QID) | RECTAL | Status: DC | PRN

## 2023-04-07 MED ORDER — ONDANSETRON HCL 4 MG/2ML IJ SOLN: 4.0000 mg | Freq: Four times a day (QID) | INTRAMUSCULAR | Status: DC | PRN

## 2023-04-07 MED ORDER — PIOGLITAZONE HCL 15 MG PO TABS
30.0000 mg | ORAL_TABLET | Freq: Every day | ORAL | Status: DC
  Administered 2023-04-08 – 2023-04-11 (×4): 30 mg via ORAL
  Filled 2023-04-07 (×4): qty 2

## 2023-04-07 MED ORDER — VITAMIN B-12 1000 MCG PO TABS
1000.0000 ug | ORAL_TABLET | Freq: Every day | ORAL | Status: DC
  Administered 2023-04-08 – 2023-04-11 (×4): 1000 ug via ORAL
  Filled 2023-04-07 (×4): qty 1

## 2023-04-07 MED ORDER — OMEGA-3-ACID ETHYL ESTERS 1 G PO CAPS
1.0000 g | ORAL_CAPSULE | Freq: Two times a day (BID) | ORAL | Status: DC
  Administered 2023-04-07 – 2023-04-11 (×8): 1 g via ORAL
  Filled 2023-04-07 (×8): qty 1

## 2023-04-07 MED ORDER — PANTOPRAZOLE SODIUM 40 MG PO TBEC
40.0000 mg | DELAYED_RELEASE_TABLET | Freq: Two times a day (BID) | ORAL | Status: DC
  Administered 2023-04-07 – 2023-04-11 (×8): 40 mg via ORAL
  Filled 2023-04-07 (×8): qty 1

## 2023-04-07 MED ORDER — FENOFIBRATE 160 MG PO TABS
160.0000 mg | ORAL_TABLET | Freq: Every day | ORAL | Status: DC
  Administered 2023-04-08 – 2023-04-11 (×4): 160 mg via ORAL
  Filled 2023-04-07 (×4): qty 1

## 2023-04-07 MED ORDER — POTASSIUM CHLORIDE CRYS ER 20 MEQ PO TBCR
20.0000 meq | EXTENDED_RELEASE_TABLET | Freq: Every day | ORAL | Status: DC
  Administered 2023-04-07 – 2023-04-09 (×3): 20 meq via ORAL
  Filled 2023-04-07 (×3): qty 1

## 2023-04-07 MED ORDER — ASPIRIN 81 MG PO TBEC
81.0000 mg | DELAYED_RELEASE_TABLET | Freq: Every day | ORAL | Status: DC
  Administered 2023-04-08 – 2023-04-11 (×4): 81 mg via ORAL
  Filled 2023-04-07 (×4): qty 1

## 2023-04-07 MED ORDER — ATORVASTATIN CALCIUM 40 MG PO TABS: 80.0000 mg | ORAL_TABLET | Freq: Every day | ORAL | Status: DC

## 2023-04-07 MED ORDER — CARVEDILOL 6.25 MG PO TABS
6.2500 mg | ORAL_TABLET | Freq: Two times a day (BID) | ORAL | Status: DC
  Administered 2023-04-07 – 2023-04-09 (×4): 6.25 mg via ORAL
  Filled 2023-04-07 (×4): qty 1

## 2023-04-07 MED ORDER — FESOTERODINE FUMARATE ER 4 MG PO TB24
4.0000 mg | ORAL_TABLET | Freq: Every day | ORAL | Status: DC
  Administered 2023-04-07 – 2023-04-10 (×4): 4 mg via ORAL
  Filled 2023-04-07 (×6): qty 1

## 2023-04-07 MED ORDER — FERROUS SULFATE 325 (65 FE) MG PO TABS
325.0000 mg | ORAL_TABLET | Freq: Every day | ORAL | Status: DC
  Administered 2023-04-08 – 2023-04-11 (×4): 325 mg via ORAL
  Filled 2023-04-07 (×5): qty 1

## 2023-04-07 MED ORDER — AMIODARONE HCL 200 MG PO TABS
200.0000 mg | ORAL_TABLET | Freq: Every day | ORAL | Status: DC
  Administered 2023-04-07 – 2023-04-08 (×2): 200 mg via ORAL
  Filled 2023-04-07 (×2): qty 1

## 2023-04-07 MED ORDER — EMPAGLIFLOZIN 10 MG PO TABS
10.0000 mg | ORAL_TABLET | Freq: Every day | ORAL | Status: DC
  Administered 2023-04-08 – 2023-04-11 (×4): 10 mg via ORAL
  Filled 2023-04-07 (×4): qty 1

## 2023-04-07 NOTE — H&P (Signed)
History and Physical    Patient: Matthew Chen GEX:[id number] DOB: October 19, 1955 DOA: 04/07/2023 DOS: the patient was seen and examined on 04/07/2023 PCP: Donita Brooks, MD  Patient coming from: Home  Chief Complaint: Shortness of breath.  HPI: Matthew Chen is a 67 y.o. male with medical history significant of colon polyps, type II DM, hypertension, CAD, anginal pain, thoracic aortic aneurysm, bicuspid aortic valve, PAD, paroxysmal atrial fibrillation not on anticoagulation, chronic systolic heart failure with an EF of 45-50 % on recent echocardiogram who is following with Dr. Truett Perna due to hepatocellular carcinoma who is being referred to our service for admission due to progressively worse dyspnea, lower extremity and abdominal edema for the past week.  He has not been taking his furosemide.  He has been drinking a lot of water.  No high sodium dietary indiscretions. He denied fever, chills, rhinorrhea, sore throat, wheezing or hemoptysis.  No chest pain, palpitations, diaphoresis, PND, positive orthopnea.  Positive RUQ pain and nausea, but no emesis, diarrhea, constipation, melena or hematochezia.  No flank pain, dysuria, frequency or hematuria.  No polyuria, polydipsia, polyphagia or blurred vision.   Lab work: CBC showed a white count 11.9, hemoglobin 10.4 g/dL platelets 010.  CMP showed normal electrolytes after sodium and calcium correction.  Glucose 153, BUN 20 and creatinine 1.42 mg/dL (2 weeks ago his creatinine was 1.38 and a month ago 0.95 mg/dL.Marland Kitchen  Total protein 5.8 and albumin 2.6 g/dL.  AST 144 ALT 27 alkaline phosphatase 207 units/L.  Total bilirubin was 3.9 mg/dL.  His previous bilirubin was normal.  BMP was 1374.1 pg/mL.  Imaging: Portable 1 view chest radiograph with cardiomegaly, but no acute cardiopulmonary process.   Review of Systems: As mentioned in the history of present illness. All other systems reviewed and are negative. Past Medical History:  Diagnosis Date    Adenomatous colon polyp 03/2008   Anginal pain (HCC)    Aortic aneurysm, thoracic (HCC) 09/09/2016   4.4 cm by echo 05/2015   Arthritis    "joints in my fingers ache" (07/13/2015)   Bicuspid aortic valve 09/09/2016   Cataract 2019   bilateral eyes   CKD (chronic kidney disease), stage III (HCC)    Coronary artery disease    Diabetes mellitus without complication (HCC)    Hemorrhoids    Hepatocellular carcinoma (HCC)    Hyperlipidemia    Hypertension    Paroxysmal atrial fibrillation (HCC)    Peripheral arterial disease (HCC)    a. dopppler 05/29/2015 revealed high-grade right popliteal and distal left SFA disease b. LE angio 06/28/2015 revealing high grade calcific dx with distal L SFA and R popliteal artery s/p diamondback orbital rotational atherectomy, PTA of L SFA  c. 07/13/2015 diamondback orbital rotational atherectomy and drug eluting balloon angioplasty of R popliteal artery (P2 segment) using distal protection    Type II diabetes mellitus (HCC)    Ulcerative colitis    Past Surgical History:  Procedure Laterality Date   ABDOMINAL AORTOGRAM W/LOWER EXTREMITY N/A 04/27/2018   Procedure: ABDOMINAL AORTOGRAM W/LOWER EXTREMITY;  Surgeon: Runell Gess, MD;  Location: [facility];  Service: Cardiovascular;  Laterality: N/A;   BIOPSY  01/03/2023   Procedure: BIOPSY;  Surgeon: Benancio Deeds, MD;  Location: [facility] ENDOSCOPY;  Service: Gastroenterology;;   COLONOSCOPY     COLONOSCOPY W/ POLYPECTOMY  X 4-5   CORONARY ATHERECTOMY N/A 02/15/2021   Procedure: CORONARY ATHERECTOMY;  Surgeon: Runell Gess, MD;  Location: [facility];  Service: Cardiovascular;  Laterality: N/A;  aborted    CORONARY BALLOON ANGIOPLASTY N/A 02/15/2021   Procedure: CORONARY BALLOON ANGIOPLASTY;  Surgeon: Runell Gess, MD;  Location: [facility];  Service: Cardiovascular;  Laterality: N/A;   COSMETIC SURGERY  < 2000   "removed birthmark from top of my head"   FLEXIBLE  SIGMOIDOSCOPY N/A 01/03/2023   Procedure: FLEXIBLE SIGMOIDOSCOPY;  Surgeon: Benancio Deeds, MD;  Location: [facility] ENDOSCOPY;  Service: Gastroenterology;  Laterality: N/A;   IR ANGIOGRAM SELECTIVE EACH ADDITIONAL VESSEL  06/08/2021   IR ANGIOGRAM SELECTIVE EACH ADDITIONAL VESSEL  06/08/2021   IR ANGIOGRAM SELECTIVE EACH ADDITIONAL VESSEL  06/08/2021   IR ANGIOGRAM SELECTIVE EACH ADDITIONAL VESSEL  06/08/2021   IR ANGIOGRAM SELECTIVE EACH ADDITIONAL VESSEL  06/21/2021   IR ANGIOGRAM SELECTIVE EACH ADDITIONAL VESSEL  06/21/2021   IR ANGIOGRAM SELECTIVE EACH ADDITIONAL VESSEL  06/21/2021   IR ANGIOGRAM SELECTIVE EACH ADDITIONAL VESSEL  06/21/2021   IR ANGIOGRAM SELECTIVE EACH ADDITIONAL VESSEL  03/07/2023   IR ANGIOGRAM SELECTIVE EACH ADDITIONAL VESSEL  03/07/2023   IR ANGIOGRAM SELECTIVE EACH ADDITIONAL VESSEL  03/21/2023   IR ANGIOGRAM SELECTIVE EACH ADDITIONAL VESSEL  03/21/2023   IR ANGIOGRAM VISCERAL SELECTIVE  06/08/2021   IR ANGIOGRAM VISCERAL SELECTIVE  06/21/2021   IR ANGIOGRAM VISCERAL SELECTIVE  03/07/2023   IR ANGIOGRAM VISCERAL SELECTIVE  03/21/2023   IR EMBO ARTERIAL NOT HEMORR HEMANG INC GUIDE ROADMAPPING  06/08/2021   IR EMBO TUMOR ORGAN ISCHEMIA INFARCT INC GUIDE ROADMAPPING  06/21/2021   IR EMBO TUMOR ORGAN ISCHEMIA INFARCT INC GUIDE ROADMAPPING  06/21/2021   IR EMBO TUMOR ORGAN ISCHEMIA INFARCT INC GUIDE ROADMAPPING  03/21/2023   IR RADIOLOGIST EVAL & MGMT  05/08/2021   IR RADIOLOGIST EVAL & MGMT  07/26/2021   IR RADIOLOGIST EVAL & MGMT  09/05/2021   IR RADIOLOGIST EVAL & MGMT  12/27/2021   IR RADIOLOGIST EVAL & MGMT  02/05/2023   IR US GUIDE VASC ACCESS LEFT  06/21/2021   IR US GUIDE VASC ACCESS LEFT  03/07/2023   IR US GUIDE VASC ACCESS LEFT  03/07/2023   IR US GUIDE VASC ACCESS LEFT  03/07/2023   IR US GUIDE VASC ACCESS LEFT  03/21/2023   IR US GUIDE VASC ACCESS RIGHT  06/08/2021   LEFT HEART CATH AND CORONARY ANGIOGRAPHY N/A 02/08/2021   Procedure: LEFT HEART CATH AND CORONARY  ANGIOGRAPHY;  Surgeon: Runell Gess, MD;  Location: [facility];  Service: Cardiovascular;  Laterality: N/A;   PERIPHERAL VASCULAR ATHERECTOMY  04/27/2018   Procedure: PERIPHERAL VASCULAR ATHERECTOMY;  Surgeon: Runell Gess, MD;  Location: [facility] INVASIVE CV LAB;  Service: Cardiovascular;;  right popliteal artery   PERIPHERAL VASCULAR CATHETERIZATION N/A 06/29/2015   Procedure: Lower Extremity Angiography;  Surgeon: Runell Gess, MD;  Location: [facility] INVASIVE CV LAB;  Service: Cardiovascular;  Laterality: N/A;   PERIPHERAL VASCULAR CATHETERIZATION N/A 07/13/2015   Procedure: Lower Extremity Angiography;  Surgeon: Runell Gess, MD;  Location: [facility] INVASIVE CV LAB;  Service: Cardiovascular;  Laterality: N/A;   PERIPHERAL VASCULAR CATHETERIZATION  07/13/2015   Procedure: Peripheral Vascular Atherectomy;  Surgeon: Runell Gess, MD;  Location: [facility];  Service: Cardiovascular;;   PERIPHERAL VASCULAR INTERVENTION  04/27/2018   Procedure: PERIPHERAL VASCULAR INTERVENTION;  Surgeon: Runell Gess, MD;  Location: [facility];  Service: Cardiovascular;;  right popliteal artery   POLYPECTOMY     TONSILLECTOMY     UPPER GASTROINTESTINAL ENDOSCOPY  Social History:  reports that he quit smoking about 26 years ago. His smoking use included cigarettes. He started smoking about 51 years ago. He has a 37.5 pack-year smoking history. He has never used smokeless tobacco. He reports current alcohol use of about 1.0 standard drink of alcohol per week. He reports that he does not use drugs.  Allergies  Allergen Reactions   Penicillins Other (See Comments)    Unsure, childhood reaction Has patient had a PCN reaction causing immediate rash, facial/tongue/throat swelling, SOB or lightheadedness with hypotension: Unknown Has patient had a PCN reaction causing severe rash involving mucus membranes or skin necrosis: Unknown Has patient had a PCN reaction that required  hospitalization: No Has patient had a PCN reaction occurring within the last 10 years: No If all of the above answers are "NO", then may proceed with Cephalosporin use.     Family History  Problem Relation Age of Onset   Colon cancer Maternal Grandmother        in her 57's   Colon polyps Mother        in her 2's   Diabetes Mother    Heart disease Father    Diabetes Maternal Aunt        Aunts and Uncles   Esophageal cancer Neg Hx    Stomach cancer Neg Hx    Rectal cancer Neg Hx     Prior to Admission medications   Medication Sig Start Date End Date Taking? Authorizing Provider  acetaminophen (TYLENOL) 500 MG tablet Take 1 tablet (500 mg total) by mouth every 6 (six) hours as needed. Patient taking differently: Take 500 mg by mouth every 6 (six) hours as needed for moderate pain. 03/27/22   Derwood Kaplan, MD  amiodarone (PACERONE) 200 MG tablet Take 1 tablet (200 mg total) by mouth daily. 01/28/23   Graciella Freer, [name]  apixaban (ELIQUIS) 5 MG TABS tablet Take 1 tablet (5 mg total) by mouth 2 (two) times daily. Patient not taking: Reported on 04/03/2023 01/08/23   Narda Bonds, MD  aspirin EC 81 MG tablet Take 1 tablet (81 mg total) by mouth daily. 06/01/15   Chilton Si, MD  atorvastatin (LIPITOR) 80 MG tablet Take 1 tablet (80 mg total) by mouth daily. 10/30/22   Joylene Grapes, NP  carvedilol (COREG) 12.5 MG tablet Take 1 tablet (12.5 mg total) by mouth 2 (two) times daily with a meal. Patient taking differently: Take 12.5 mg by mouth 2 (two) times daily with a meal. Per recent PCP visit: taking 0.5 tablets BID 01/27/23   Donita Brooks, MD  cyanocobalamin (VITAMIN B12) 1000 MCG tablet Take 1,000 mcg by mouth daily.    [provider]  dicyclomine (BENTYL) 10 MG capsule Take 1 capsule (10 mg total) by mouth in the morning and at bedtime. Patient not taking: Reported on 04/03/2023 02/06/23   Meredith Pel, NP  diltiazem (CARDIZEM) 30 MG tablet TAKE 1  TABLET (30 MG TOTAL) BY MOUTH 3 (THREE) TIMES DAILY AS NEEDED (PALPITATIONS). Patient not taking: Reported on 04/03/2023 01/28/23 01/28/24  Joylene Grapes, NP  empagliflozin (JARDIANCE) 10 MG TABS tablet Take 1 tablet (10 mg total) by mouth daily. 10/30/22   Joylene Grapes, NP  fenofibrate 160 MG tablet Take 1 tablet (160 mg total) by mouth daily. 03/22/22   Donita Brooks, MD  furosemide (LASIX) 40 MG tablet TAKE 1 TABLET (40 MG TOTAL) BY MOUTH DAILY AS NEEDED FOR EDEMA. Patient not taking: Reported  on 04/03/2023 01/27/23   Donita Brooks, MD  glucose blood (ONETOUCH ULTRA) test strip CHECK BLOOD SUGAR TWICE A DAY 03/19/21   Donita Brooks, MD  Insulin Pen Needle (B-D UF III MINI PEN NEEDLES) 31G X 5 MM MISC Use daily with Victoza 05/31/22   Donita Brooks, MD  Iron, Ferrous Sulfate, 325 (65 Fe) MG TABS Take 325 mg by mouth daily. 01/22/23   Donita Brooks, MD  mirabegron ER (MYRBETRIQ) 25 MG TB24 tablet Take 1 tablet (25 mg total) by mouth daily. 03/31/23   Donita Brooks, MD  Omega-3 Fatty Acids (FISH OIL) 1000 MG CAPS Take 1,000 mg by mouth 2 (two) times daily.    [provider]  oxyCODONE (ROXICODONE) 5 MG immediate release tablet Take 1 tablet (5 mg total) by mouth every 4 (four) hours as needed for severe pain. Patient not taking: Reported on 04/03/2023 01/03/23   Donita Brooks, MD  pantoprazole (PROTONIX) 40 MG tablet Take 1 tablet (40 mg total) by mouth 2 (two) times daily. 03/22/22   Donita Brooks, MD  pioglitazone (ACTOS) 30 MG tablet Take 1 tablet (30 mg total) by mouth daily. 01/27/23   Donita Brooks, MD  sildenafil (VIAGRA) 100 MG tablet Take 0.5-1 tablets (50-100 mg total) by mouth daily as needed for erectile dysfunction. Patient not taking: Reported on 04/03/2023 05/30/22   Donita Brooks, MD  sitaGLIPtin (JANUVIA) 100 MG tablet Take 1 tablet (100 mg total) by mouth daily. 11/05/22   Park Meo, FNP  solifenacin (VESICARE) 10 MG tablet Take 1 tablet  (10 mg total) by mouth daily. 02/27/23   Donita Brooks, MD  vedolizumab (ENTYVIO) 300 MG injection Inject 300 mg as directed See admin instructions. Every 2 months    [provider]    Physical Exam: Vitals:   04/07/23 1300  BP: 134/74  Pulse: 77  Temp: 98.5 F (36.9 C)  TempSrc: Oral  SpO2: 99%   Physical Exam Vitals and nursing note reviewed.  Constitutional:      Appearance: Normal appearance.  HENT:     Head: Normocephalic.     Mouth/Throat:     Mouth: Mucous membranes are moist.  Eyes:     General: No scleral icterus.    Pupils: Pupils are equal, round, and reactive to light.  Cardiovascular:     Rate and Rhythm: Normal rate and regular rhythm.  Pulmonary:     Effort: Pulmonary effort is normal.  Abdominal:     General: Bowel sounds are normal.     Palpations: Abdomen is soft.  Musculoskeletal:     Cervical back: Neck supple.     Right lower leg: Edema present.     Left lower leg: Edema present.  Skin:    General: Skin is warm and dry.  Neurological:     General: No focal deficit present.     Mental Status: He is alert and oriented to person, place, and time.  Psychiatric:        Mood and Affect: Mood normal.        Behavior: Behavior normal.     Data Reviewed:  Results are pending, will review when available.  04/07/2023 transthoracic echocardiogram. IMPRESSIONS:   1. Left ventricular ejection fraction, by estimation, is 45 to 50%. The left ventricle has mildly decreased function. The left ventricle demonstrates global hypokinesis. Left ventricular diastolic parameters are indeterminate.  2. Right ventricular systolic function is normal. The right ventricular size is  normal. There is mildly elevated pulmonary artery systolic pressure. The estimated right ventricular systolic pressure is 39.5 mmHg.  3. Left atrial size was severely dilated.  4. Right atrial size was mildly dilated.  5. The mitral valve is normal in structure. Mild to  moderate mitral valve regurgitation. No evidence of mitral stenosis.  6. The aortic valve is bicuspid. There is moderate calcification of the aortic valve. There is moderate thickening of the aortic valve. Aortic valve regurgitation is not visualized. Aortic valve sclerosis is present, with no evidence of aortic valve stenosis.  7. Aortic dilatation noted. There is mild dilatation of the aortic root, measuring 43 mm. There is mild dilatation of the ascending aorta, measuring 41 mm.  8. The inferior vena cava is normal in size with greater than 50% respiratory variability, suggesting right atrial pressure of 3 mmHg.  Assessment and Plan: Principal Problem:   Acute on chronic systolic CHF (congestive heart failure) (HCC) Symptoms exacerbated by lack of diuretic and worsening liver function. Observation/telemetry. Supplemental oxygen as needed. Sodium and fluid restriction. Continue furosemide 40 mg IVP twice daily. Monitor daily weights, intake and output. Monitor renal function electrolytes. Cardiology to see in AM.  Active Problems:   Hepatocellular carcinoma (HCC) Now with:   Hyperbilirubinemia Follow LFTs in the morning. Check PT/INR in AM. Continue treatment as above. Will see if function improves with diuresis.    Hypertension Continue furosemide as above. Continue carvedilol 6.25 mg p.o. twice daily.    CKD (chronic kidney disease), stage III (HCC) Monitor renal function and electrolytes.    Paroxysmal atrial fibrillation (HCC) Off anticoagulation. Continue carvedilol 6.25 mg p.o. twice daily.    Peripheral arterial disease (HCC)   CAD (coronary artery disease) Continue aspirin and beta-blocker. Statin held due to worsening of the liver function.    Hyperlipidemia Hold atorvastatin due to liver function.     Advance Care Planning:   Code Status: Full Code   Consults: Cardiology consult requested and will be seeing in the morning.  Family Communication:  His wife was at bedside.  Severity of Illness: The appropriate patient status for this patient is OBSERVATION. Observation status is judged to be reasonable and necessary in order to provide the required intensity of service to ensure the patient's safety. The patient's presenting symptoms, physical exam findings, and initial radiographic and laboratory data in the context of their medical condition is felt to place them at decreased risk for further clinical deterioration. Furthermore, it is anticipated that the patient will be medically stable for discharge from the hospital within 2 midnights of admission.   Author: Bobette Mo, MD 04/07/2023 1:06 PM  For on call review [email or web address].   This document was prepared using Dragon voice recognition software and may contain some unintended transcription errors.

## 2023-04-07 NOTE — Progress Notes (Signed)
CRITICAL VALUE STICKER  CRITICAL VALUE: Total bili=3.9  RECEIVER (on-site recipient of call):Shanda Cadotte,RN  DATE & TIME NOTIFIED: 04/07/23 @ 1105  MESSENGER (representative from lab):Marchelle Folks  MD NOTIFIED: Lonna Cobb, NP   TIME OF NOTIFICATION:1109  RESPONSE: Being admitted to hospital.

## 2023-04-07 NOTE — Progress Notes (Signed)
[facility] OFFICE PROGRESS NOTE   Diagnosis: Hepatocellular carcinoma  INTERVAL HISTORY:   Matthew Chen returns as scheduled.  He underwent Y90 left lobe 03/21/2023.  He has developed severe shortness of breath over the past 1 week.  He feels very weak.  No cough.  No chest pain.  His abdomen feels distended.  He notes asymmetric leg edema left greater than right.  Objective:  Vital signs in last 24 hours:  Blood pressure 105/67, pulse 64, temperature 98.1 F (36.7 C), temperature source Tympanic, resp. rate 18, weight 212 lb 9.6 oz (96.4 kg), SpO2 100%.    HEENT: No thrush.  Mucous membranes are moist. Resp: Breath sounds diminished at both lung bases.  Faint rales left lung base.  Increased respiratory rate. Cardio: Regular rate and rhythm.  Veins are distended. GI: Abdomen is distended.  ? palpable liver edge. Vascular: Pitting edema lower leg bilaterally left greater than right.   Lab Results:  Lab Results  Component Value Date   WBC 11.8 (H) 04/07/2023   HGB 10.4 (L) 04/07/2023   HCT 33.5 (L) 04/07/2023   MCV 89.6 04/07/2023   PLT 345 04/07/2023   NEUTROABS 10.0 (H) 04/07/2023    Imaging:  No results found.  Medications: I have reviewed the patient's current medications.  Assessment/Plan: Hepatocellular carcinoma CT angio chest aorta 01/30/2021-no evidence of thoracic aortic aneurysm, diffuse hepatic steatosis, suggestion of early cirrhosis, 7.1 cm enhancing masslike area in segment 5/6, prominent gastrohepatic and periportal nodes measuring up to 7 mm MRI liver 03/22/2021-arterial hyperenhancing subcapsular mass, segment 6, 7 x 5.1 cm, LI-RADS 5, no pathologically large lymph nodes Ultrasound-guided biopsy 04/24/2021-hepatocellular carcinoma Y90 radioembolization of segment 6 and segment 7 hepatic arterial branches 06/21/2021 MRI abdomen 09/03/2021-new increased enhancement inferior and superior to the dominant right liver lesion, new 11 mm enhancing  lesion in the left liver LIRADS 3, dominant right hepatic lesion stable in size MRI abdomen 12/19/2021-unchanged hyperenhancing mass in the anterior inferior right liver measuring 7.8 x 5 cm, interval enlargement of a arterial enhancing lesion in segment 2- LIRADS 5, new small enhancing lesions hepatic segment 4A measuring 1.2 x 0.9 cm and 0.8 x 0.6 cm- LIRADS 5 Cycle 1 atezolizumab /bevacizumab 02/07/2022 Cycle 2 atezolizumab/bevacizumab 02/27/2022 Cycle 3 atezolizumab/bevacizumab 03/21/2022 Cycle 4 atezolizumab/bevacizumab 04/22/2022 MRI abdomen 05/05/2022-no change in dominant segment 6 lesion, no change in hyperenhancing lesions in segment 4A and segment 2, no new lesions Cycle 5 atezolizumab/bevacizumab 05/13/2022 Cycle 6 atezolizumab/bevacizumab 06/04/2022 Cycle 7 atezolizumab/bevacizumab 06/24/2022 Cycle 8 atezolizumab/bevacizumab 07/15/2022 MRI abdomen 07/20/2022-mild progression of bilateral liver lesions, no evidence of extrahepatic metastatic disease MRI abdomen [facility] 08/19/2022-increased size of the dominant admitted 5/6 lesion, now measuring 12.7 x 8.8 cm, multiple additional hepatic lesions appear larger than on the previous exam, stable mildly enlarged upper abdominal lymph nodes Lenvatinib 09/09/2022, stopped 10/04/2022 Cabozantinib 10/11/2022 MRI abdomen 12/27/2022-technical limitation on the 07/20/2022 exam makes direct comparison difficult, notes significant change in the enhancing liver lesions Cabozantinib placed on hold 12/31/2022 Seen by Dr. Elby Showers Interventional Radiology 02/05/2023-plan for radiation segmentectomy with split doses between right and left lobe masses 03/21/2023-Y90 left lobe Ulcerative colitis-maintained on vedolizumab Peripheral arterial disease History of a thoracic aortic aneurysm CAD Diabetes Hypertension Chronic renal failure Paroxysmal atrial fibrillation Admission 01/02/2023 with increased diarrhea-sigmoidoscopy with a single ulcer at the dentate line and an anal  fissure, sigmoid colon unremarkable.  Negative enteric pathogen panel Anemia secondary to chronic disease, renal failure, possible GI bleeding-status post Red cell transfusion 02/14/2023.  Disposition:  Matthew Chen has hepatocellular carcinoma.  He underwent Y90 to the left lobe 03/21/2023.  He presents today for routine follow-up, reports progressive dyspnea over the past 1 week, associated weakness.  We discussed the possibility of heart failure, pulmonary embolus.  Less likely current presentation is related to [facility]  We are recommending hospitalization.  Dr. Truett Perna has spoken with Dr. Robb Matar.  Matthew Chen will be admitted to [facility].  Patient seen with Dr. Truett Perna.  Lonna Cobb ANP/GNP-BC   04/07/2023  10:23 AM  This was a shared visit with Lonna Cobb.  Matthew Chen was interviewed and examined.  He presents today with increased dyspnea.  He had significant dyspnea when ambulating to the exam table.  He has lower extremity edema and the neck veins are distended.  The dyspnea may be related to CHF.  The differential diagnosis includes a pulmonary embolism, progressive hepatocellular carcinoma, toxicity from the recent radioembolization, and pneumonia.  I contacted Dr. Robb Matar and he graciously agreed to accept Matthew Chen for hospital admission.  I will check on him in the hospital.  I was present for greater than 50% of today's visit.  I performed medical decision making.  Mancel Bale, MD

## 2023-04-08 DIAGNOSIS — D631 Anemia in chronic kidney disease: Secondary | ICD-10-CM | POA: Diagnosis not present

## 2023-04-08 DIAGNOSIS — E46 Unspecified protein-calorie malnutrition: Secondary | ICD-10-CM | POA: Diagnosis not present

## 2023-04-08 DIAGNOSIS — R0602 Shortness of breath: Secondary | ICD-10-CM | POA: Diagnosis present

## 2023-04-08 DIAGNOSIS — Z91128 Patient's intentional underdosing of medication regimen for other reason: Secondary | ICD-10-CM | POA: Diagnosis not present

## 2023-04-08 DIAGNOSIS — E1151 Type 2 diabetes mellitus with diabetic peripheral angiopathy without gangrene: Secondary | ICD-10-CM | POA: Diagnosis not present

## 2023-04-08 DIAGNOSIS — E860 Dehydration: Secondary | ICD-10-CM | POA: Diagnosis not present

## 2023-04-08 DIAGNOSIS — I5043 Acute on chronic combined systolic (congestive) and diastolic (congestive) heart failure: Secondary | ICD-10-CM

## 2023-04-08 DIAGNOSIS — E669 Obesity, unspecified: Secondary | ICD-10-CM | POA: Diagnosis not present

## 2023-04-08 DIAGNOSIS — K5190 Ulcerative colitis, unspecified, without complications: Secondary | ICD-10-CM | POA: Diagnosis not present

## 2023-04-08 DIAGNOSIS — E785 Hyperlipidemia, unspecified: Secondary | ICD-10-CM | POA: Diagnosis not present

## 2023-04-08 DIAGNOSIS — N179 Acute kidney failure, unspecified: Secondary | ICD-10-CM

## 2023-04-08 DIAGNOSIS — I2510 Atherosclerotic heart disease of native coronary artery without angina pectoris: Secondary | ICD-10-CM | POA: Diagnosis not present

## 2023-04-08 DIAGNOSIS — I70202 Unspecified atherosclerosis of native arteries of extremities, left leg: Secondary | ICD-10-CM | POA: Diagnosis not present

## 2023-04-08 DIAGNOSIS — I4819 Other persistent atrial fibrillation: Secondary | ICD-10-CM

## 2023-04-08 DIAGNOSIS — C220 Liver cell carcinoma: Secondary | ICD-10-CM | POA: Diagnosis not present

## 2023-04-08 DIAGNOSIS — Q231 Congenital insufficiency of aortic valve: Secondary | ICD-10-CM | POA: Diagnosis not present

## 2023-04-08 DIAGNOSIS — I5023 Acute on chronic systolic (congestive) heart failure: Secondary | ICD-10-CM | POA: Diagnosis not present

## 2023-04-08 DIAGNOSIS — N182 Chronic kidney disease, stage 2 (mild): Secondary | ICD-10-CM | POA: Diagnosis not present

## 2023-04-08 DIAGNOSIS — N183 Chronic kidney disease, stage 3 unspecified: Secondary | ICD-10-CM

## 2023-04-08 DIAGNOSIS — E871 Hypo-osmolality and hyponatremia: Secondary | ICD-10-CM | POA: Diagnosis not present

## 2023-04-08 DIAGNOSIS — E1122 Type 2 diabetes mellitus with diabetic chronic kidney disease: Secondary | ICD-10-CM | POA: Diagnosis not present

## 2023-04-08 DIAGNOSIS — E8809 Other disorders of plasma-protein metabolism, not elsewhere classified: Secondary | ICD-10-CM | POA: Diagnosis not present

## 2023-04-08 DIAGNOSIS — I130 Hypertensive heart and chronic kidney disease with heart failure and stage 1 through stage 4 chronic kidney disease, or unspecified chronic kidney disease: Secondary | ICD-10-CM | POA: Diagnosis not present

## 2023-04-08 DIAGNOSIS — R310 Gross hematuria: Secondary | ICD-10-CM | POA: Diagnosis not present

## 2023-04-08 DIAGNOSIS — T501X6A Underdosing of loop [high-ceiling] diuretics, initial encounter: Secondary | ICD-10-CM | POA: Diagnosis not present

## 2023-04-08 DIAGNOSIS — E876 Hypokalemia: Secondary | ICD-10-CM | POA: Diagnosis not present

## 2023-04-08 DIAGNOSIS — R17 Unspecified jaundice: Secondary | ICD-10-CM | POA: Diagnosis not present

## 2023-04-08 LAB — COMPREHENSIVE METABOLIC PANEL
ALT: 26 U/L (ref 0–44)
AST: 107 U/L — ABNORMAL HIGH (ref 15–41)
Albumin: 1.8 g/dL — ABNORMAL LOW (ref 3.5–5.0)
Alkaline Phosphatase: 187 U/L — ABNORMAL HIGH (ref 38–126)
Anion gap: 8 (ref 5–15)
BUN: 25 mg/dL — ABNORMAL HIGH (ref 8–23)
CO2: 23 mmol/L (ref 22–32)
Calcium: 7.9 mg/dL — ABNORMAL LOW (ref 8.9–10.3)
Chloride: 101 mmol/L (ref 98–111)
Creatinine, Ser: 1.52 mg/dL — ABNORMAL HIGH (ref 0.61–1.24)
GFR, Estimated: 50 mL/min — ABNORMAL LOW (ref >60)
Glucose, Bld: 109 mg/dL — ABNORMAL HIGH (ref 70–99)
Potassium: 4 mmol/L (ref 3.5–5.1)
Sodium: 132 mmol/L — ABNORMAL LOW (ref 135–145)
Total Bilirubin: 3.8 mg/dL — ABNORMAL HIGH (ref 0.3–1.2)
Total Protein: 5.8 g/dL — ABNORMAL LOW (ref 6.5–8.1)

## 2023-04-08 LAB — CBC
HCT: 29 % — ABNORMAL LOW (ref 39.0–52.0)
Hemoglobin: 9.1 g/dL — ABNORMAL LOW (ref 13.0–17.0)
MCH: 28.2 pg (ref 26.0–34.0)
MCHC: 31.4 g/dL (ref 30.0–36.0)
MCV: 89.8 fL (ref 80.0–100.0)
Platelets: 300 10*3/uL (ref 150–400)
RBC: 3.23 MIL/uL — ABNORMAL LOW (ref 4.22–5.81)
RDW: 19.9 % — ABNORMAL HIGH (ref 11.5–15.5)
WBC: 10.8 10*3/uL — ABNORMAL HIGH (ref 4.0–10.5)
nRBC: 0 % (ref 0.0–0.2)

## 2023-04-08 LAB — PROTIME-INR
INR: 1.5 — ABNORMAL HIGH (ref 0.8–1.2)
Prothrombin Time: 18.2 s — ABNORMAL HIGH (ref 11.4–15.2)

## 2023-04-08 LAB — GLUCOSE, CAPILLARY
Glucose-Capillary: 98 mg/dL (ref 70–99)
Glucose-Capillary: 134 mg/dL — ABNORMAL HIGH (ref 70–99)

## 2023-04-08 MED ORDER — FUROSEMIDE 10 MG/ML IJ SOLN
80.0000 mg | Freq: Two times a day (BID) | INTRAMUSCULAR | Status: DC
  Administered 2023-04-08 – 2023-04-10 (×5): 80 mg via INTRAVENOUS
  Filled 2023-04-08 (×5): qty 8

## 2023-04-08 NOTE — Progress Notes (Signed)
IP PROGRESS NOTE  Subjective:   [name] reports partial improvement in dyspnea.  He has hematuria.  Objective: Vital signs in last 24 hours: Blood pressure 99/63, pulse 70, temperature 98.9 F (37.2 C), temperature source Oral, resp. rate 19, height 5\' 10"  (1.778 m), weight 210 lb 1.6 oz (95.3 kg), SpO2 100%.  Intake/Output from previous day: 08/26 0701 - 08/27 0700 In: 300 [P.O.:300] Out: 1800 [Urine:1800]  Physical Exam:  HEENT: Sclera anicteric Lungs: [place] C[place] bilaterally Cardiac: Regular rate and rhythm the neck veins are mildly distended Abdomen: Mildly distended, the liver edge is palpable in the right upper abdomen Extremities: 1+ edema at the lower leg bilaterally   Lab Results: Recent Labs    04/07/23 0943 04/08/23 0410  WBC 11.8* 10.8*  HGB 10.4* 9.1*  HCT 33.5* 29.0*  PLT 345 300    BMET Recent Labs    04/07/23 0943 04/08/23 0410  NA 134* 132*  K 4.5 4.0  CL 101 101  CO2 25 23  GLUCOSE 153* 109*  BUN 20 25*  CREATININE 1.42* 1.52*  CALCIUM 8.1* 7.9*    Lab Results  Component Value Date   CEA1 1.9 06/21/2021    Studies/Results: DG Chest Port 1 View  Result Date: 04/07/2023 CLINICAL DATA:  Acute dyspnea. EXAM: PORTABLE CHEST 1 VIEW COMPARISON:  Chest radiographs 10/05/2022 and 03/27/2022 FINDINGS: Single frontal view of the chest. Cardiac silhouette is at the upper limits of normal size, unchanged. Mediastinal contours are within normal limits. The lungs are clear. No pleural effusion pneumothorax. No acute skeletal abnormality. IMPRESSION: No acute cardiopulmonary process. Electronically Signed   By: Neita Garnet M.D.   On: 04/07/2023 15:56    Medications: I have reviewed the patient's current medications.  Assessment/Plan:  Hepatocellular carcinoma CT angio chest aorta 01/30/2021-no evidence of thoracic aortic aneurysm, diffuse hepatic steatosis, suggestion of early cirrhosis, 7.1 cm enhancing masslike area in segment 5/6, prominent  gastrohepatic and periportal nodes measuring up to 7 mm MRI liver 03/22/2021-arterial hyperenhancing subcapsular mass, segment 6, 7 x 5.1 cm, LI-RADS 5, no pathologically large lymph nodes Ultrasound-guided biopsy 04/24/2021-hepatocellular carcinoma Y90 radioembolization of segment 6 and segment 7 hepatic arterial branches 06/21/2021 MRI abdomen 09/03/2021-new increased enhancement inferior and superior to the dominant right liver lesion, new 11 mm enhancing lesion in the left liver LIRADS 3, dominant right hepatic lesion stable in size MRI abdomen 12/19/2021-unchanged hyperenhancing mass in the anterior inferior right liver measuring 7.8 x 5 cm, interval enlargement of a arterial enhancing lesion in segment 2- LIRADS 5, new small enhancing lesions hepatic segment 4A measuring 1.2 x 0.9 cm and 0.8 x 0.6 cm- LIRADS 5 Cycle 1 atezolizumab /bevacizumab 02/07/2022 Cycle 2 atezolizumab/bevacizumab 02/27/2022 Cycle 3 atezolizumab/bevacizumab 03/21/2022 Cycle 4 atezolizumab/bevacizumab 04/22/2022 MRI abdomen 05/05/2022-no change in dominant segment 6 lesion, no change in hyperenhancing lesions in segment 4A and segment 2, no new lesions Cycle 5 atezolizumab/bevacizumab 05/13/2022 Cycle 6 atezolizumab/bevacizumab 06/04/2022 Cycle 7 atezolizumab/bevacizumab 06/24/2022 Cycle 8 atezolizumab/bevacizumab 07/15/2022 MRI abdomen 07/20/2022-mild progression of bilateral liver lesions, no evidence of extrahepatic metastatic disease MRI abdomen [facility] 08/19/2022-increased size of the dominant admitted 5/6 lesion, now measuring 12.7 x 8.8 cm, multiple additional hepatic lesions appear larger than on the previous exam, stable mildly enlarged upper abdominal lymph nodes Lenvatinib 09/09/2022, stopped 10/04/2022 Cabozantinib 10/11/2022 MRI abdomen 12/27/2022-technical limitation on the 07/20/2022 exam makes direct comparison difficult, notes significant change in the enhancing liver lesions Cabozantinib placed on hold 12/31/2022 Seen by Dr.  Elby Showers Interventional Radiology 02/05/2023-plan for radiation segmentectomy  with split doses between right and left lobe masses 03/21/2023-Y90 left lobe Ulcerative colitis-maintained on vedolizumab Peripheral arterial disease History of a thoracic aortic aneurysm CAD Diabetes Hypertension Chronic renal failure Paroxysmal atrial fibrillation Admission 01/02/2023 with increased diarrhea-sigmoidoscopy with a single ulcer at the dentate line and an anal fissure, sigmoid colon unremarkable.  Negative enteric pathogen panel Anemia secondary to chronic disease, renal failure, possible GI/GU bleeding-status post Red cell transfusion 02/14/2023. Admission 04/07/2023 with increased dyspnea   [name] was admitted yesterday with increased dyspnea.  A chest x-ray was negative.  The dyspnea may be related to CHF complicated by comorbid conditions including renal failure, hepatocellular carcinoma, and anemia.  He has increased hyperbilirubinemia, potentially secondary to the recent radioembolization.  [name] reports gross hematuria.  The etiology of the hematuria is unclear.  He could have an infection or another urinary tract process.  We can consider a urology evaluation if the gross hematuria persist.  His current clinical presentation does not appear to be directly related to the hepatocellular carcinoma.  Recommendations: Evaluation/management of dyspnea per the medical service and cardiology Consider urology evaluation for persistent gross hematuria Transfuse packed red blood cells as needed for symptomatic anemia Please call oncology as needed, outpatient follow-up will be scheduled [facility]  LOS: 1 day   Thornton Papas, MD   04/08/2023, 1:50 PM

## 2023-04-08 NOTE — Consult Note (Addendum)
Cardiology Consultation   Patient ID: Matthew Chen MRN: [id number]; DOB: March 29, 1956  Admit date: 04/07/2023 Date of Consult: 04/08/2023  PCP:  Donita Brooks, MD   [facility] Providers Cardiologist:  Nanetta Batty, MD  Electrophysiologist:  Regan Lemming, MD   Patient Profile:   Matthew Chen is a 67 y.o. male with a hx of persistent atrial fibrillation, chronic diastolic heart failure, thoracic aortic aneurysm, bicuspid aortic valve, CKD stage III, CAD, hypertension, hyperlipidemia, DM2, PAD, hx of tobacco abuse, and hepatocellular carcinoma who is being seen 04/08/2023 for the evaluation of CHF exacerbation at the request of Dr. Pola Corn.  History of Present Illness:   Matthew Chen has a history of PAD with orbital rotaional atherectomy and balloon angioplasty to distal left SFA 06/2015 and to high grade right popliteal 07/2015; rotational atherectomy and self expanding stent to 95% stenosis in the right popliteal 04/2018. Chest pain was evaluated in 2020 with echo showing preserved EF and mild MR, mild AS, and 46 mm ascending thoracic aortic aneurysm. Nuclear stress test was nonischemic 01/2021. CT chest showed 7.1 cm liver mass with MRI consistent with hepatocellular carcinoma. Heart cath 01/2021 showed high grade subtotally occluded ostial calcified D1 branch. Due to CKD, decision was made to intervene in a staged manner. He was brought back to the lab on 02/15/21 but procedure was complicated by coronary perforation that did not result in pericardial effusion. During that admission, he was found to have Afib confirmed on heart monitor. He was started on eliquis and referred to EP. OP DCCV attempted but he was in sinus rhythm on presentation. He presented for tikosyn load, but this was complicated by prolonged QT (?interaction with cabozatinib) and tikosyn was discontinued. EP planned to start amiodarone PO load after lenvatinib washout. Unfortunately he presented with SOB found to be  in Afib RVR and was hospitalized 09/2022. He converted on cardizem gtt and wad discharged on coreg, amiodarone, and PRN cardizem.   Prior Echo 2022 and 2023 with preserved EF and grade 2 DD.   Echo 03/25/23 with LVEF 45-50%, severe LAE, PASP 39.5 mmHg, mild to moderate MR, aortic root 43 mm and ascending aorta 41 mm. He was last seen in the clinic 04/03/23 by Dr. Elberta Fortis and was doing well, undergoing radiation treatments. Was seen by oncology 04/07/23 and has developed SOB over the last week with distended abdomen and LE edema. He was sent to the ER for evaluation. He presented to [facility] for admission and was started on IV lasix.    He has received 40 mg IV lasix x 2 doses  BNP 1374 CXR does not appear consistent with edema or congestion AoCKD with sCr 1.52 (1.42) - baseline appears to be 1.0-1.3 Albumin 1.8 from 3.0 1 month ago Alk phos 187 AST 107  His wife is bedside and helps with history. He states he has had worsening shortness of breath - dyspnea on exertion and orthopnea, worsening LE edema and abdominal distention. He denies chest pain. He was given lasix at home for LE edema, but this resulted in voiding every hour. He was then trialed on bladder medication and lasix stopped about 1 month ago. Sounds as though he cycles between hypervolemia and significant dehydration, also with UC. On my exam, lungs are clear, but significant LE edema and abdominal distension.    Past Medical History:  Diagnosis Date   Adenomatous colon polyp 03/2008   Anginal pain (HCC)    Aortic aneurysm, thoracic (HCC) 09/09/2016  4.4 cm by echo 05/2015   Arthritis    "joints in my fingers ache" (07/13/2015)   Bicuspid aortic valve 09/09/2016   Cataract 2019   bilateral eyes   CKD (chronic kidney disease), stage III (HCC)    Coronary artery disease    Diabetes mellitus without complication (HCC)    Hemorrhoids    Hepatocellular carcinoma (HCC)    Hyperlipidemia    Hypertension    Paroxysmal atrial  fibrillation (HCC)    Peripheral arterial disease (HCC)    a. dopppler 05/29/2015 revealed high-grade right popliteal and distal left SFA disease b. LE angio 06/28/2015 revealing high grade calcific dx with distal L SFA and R popliteal artery s/p diamondback orbital rotational atherectomy, PTA of L SFA  c. 07/13/2015 diamondback orbital rotational atherectomy and drug eluting balloon angioplasty of R popliteal artery (P2 segment) using distal protection    Type II diabetes mellitus (HCC)    Ulcerative colitis     Past Surgical History:  Procedure Laterality Date   ABDOMINAL AORTOGRAM W/LOWER EXTREMITY N/A 04/27/2018   Procedure: ABDOMINAL AORTOGRAM W/LOWER EXTREMITY;  Surgeon: Runell Gess, MD;  Location: [facility] INVASIVE CV LAB;  Service: Cardiovascular;  Laterality: N/A;   BIOPSY  01/03/2023   Procedure: BIOPSY;  Surgeon: Benancio Deeds, MD;  Location: [facility] ENDOSCOPY;  Service: Gastroenterology;;   COLONOSCOPY     COLONOSCOPY W/ POLYPECTOMY  X 4-5   CORONARY ATHERECTOMY N/A 02/15/2021   Procedure: CORONARY ATHERECTOMY;  Surgeon: Runell Gess, MD;  Location: [facility] INVASIVE CV LAB;  Service: Cardiovascular;  Laterality: N/A;  aborted    CORONARY BALLOON ANGIOPLASTY N/A 02/15/2021   Procedure: CORONARY BALLOON ANGIOPLASTY;  Surgeon: Runell Gess, MD;  Location: [facility] INVASIVE CV LAB;  Service: Cardiovascular;  Laterality: N/A;   COSMETIC SURGERY  < 2000   "removed birthmark from top of my head"   FLEXIBLE SIGMOIDOSCOPY N/A 01/03/2023   Procedure: FLEXIBLE SIGMOIDOSCOPY;  Surgeon: Benancio Deeds, MD;  Location: [facility] ENDOSCOPY;  Service: Gastroenterology;  Laterality: N/A;   IR ANGIOGRAM SELECTIVE EACH ADDITIONAL VESSEL  06/08/2021   IR ANGIOGRAM SELECTIVE EACH ADDITIONAL VESSEL  06/08/2021   IR ANGIOGRAM SELECTIVE EACH ADDITIONAL VESSEL  06/08/2021   IR ANGIOGRAM SELECTIVE EACH ADDITIONAL VESSEL  06/08/2021   IR ANGIOGRAM SELECTIVE EACH ADDITIONAL VESSEL  06/21/2021   IR ANGIOGRAM  SELECTIVE EACH ADDITIONAL VESSEL  06/21/2021   IR ANGIOGRAM SELECTIVE EACH ADDITIONAL VESSEL  06/21/2021   IR ANGIOGRAM SELECTIVE EACH ADDITIONAL VESSEL  06/21/2021   IR ANGIOGRAM SELECTIVE EACH ADDITIONAL VESSEL  03/07/2023   IR ANGIOGRAM SELECTIVE EACH ADDITIONAL VESSEL  03/07/2023   IR ANGIOGRAM SELECTIVE EACH ADDITIONAL VESSEL  03/21/2023   IR ANGIOGRAM SELECTIVE EACH ADDITIONAL VESSEL  03/21/2023   IR ANGIOGRAM VISCERAL SELECTIVE  06/08/2021   IR ANGIOGRAM VISCERAL SELECTIVE  06/21/2021   IR ANGIOGRAM VISCERAL SELECTIVE  03/07/2023   IR ANGIOGRAM VISCERAL SELECTIVE  03/21/2023   IR EMBO ARTERIAL NOT HEMORR HEMANG INC GUIDE ROADMAPPING  06/08/2021   IR EMBO TUMOR ORGAN ISCHEMIA INFARCT INC GUIDE ROADMAPPING  06/21/2021   IR EMBO TUMOR ORGAN ISCHEMIA INFARCT INC GUIDE ROADMAPPING  06/21/2021   IR EMBO TUMOR ORGAN ISCHEMIA INFARCT INC GUIDE ROADMAPPING  03/21/2023   IR RADIOLOGIST EVAL & MGMT  05/08/2021   IR RADIOLOGIST EVAL & MGMT  07/26/2021   IR RADIOLOGIST EVAL & MGMT  09/05/2021   IR RADIOLOGIST EVAL & MGMT  12/27/2021   IR RADIOLOGIST EVAL & MGMT  02/05/2023   IR  US GUIDE VASC ACCESS LEFT  06/21/2021   IR US GUIDE VASC ACCESS LEFT  03/07/2023   IR US GUIDE VASC ACCESS LEFT  03/07/2023   IR US GUIDE VASC ACCESS LEFT  03/07/2023   IR US GUIDE VASC ACCESS LEFT  03/21/2023   IR US GUIDE VASC ACCESS RIGHT  06/08/2021   LEFT HEART CATH AND CORONARY ANGIOGRAPHY N/A 02/08/2021   Procedure: LEFT HEART CATH AND CORONARY ANGIOGRAPHY;  Surgeon: Runell Gess, MD;  Location: [facility] CV LAB;  Service: Cardiovascular;  Laterality: N/A;   PERIPHERAL VASCULAR ATHERECTOMY  04/27/2018   Procedure: PERIPHERAL VASCULAR ATHERECTOMY;  Surgeon: Runell Gess, MD;  Location: [facility] INVASIVE CV LAB;  Service: Cardiovascular;;  right popliteal artery   PERIPHERAL VASCULAR CATHETERIZATION N/A 06/29/2015   Procedure: Lower Extremity Angiography;  Surgeon: Runell Gess, MD;  Location: [facility] INVASIVE CV LAB;   Service: Cardiovascular;  Laterality: N/A;   PERIPHERAL VASCULAR CATHETERIZATION N/A 07/13/2015   Procedure: Lower Extremity Angiography;  Surgeon: Runell Gess, MD;  Location: [facility] INVASIVE CV LAB;  Service: Cardiovascular;  Laterality: N/A;   PERIPHERAL VASCULAR CATHETERIZATION  07/13/2015   Procedure: Peripheral Vascular Atherectomy;  Surgeon: Runell Gess, MD;  Location: [facility] INVASIVE CV LAB;  Service: Cardiovascular;;   PERIPHERAL VASCULAR INTERVENTION  04/27/2018   Procedure: PERIPHERAL VASCULAR INTERVENTION;  Surgeon: Runell Gess, MD;  Location: [facility] INVASIVE CV LAB;  Service: Cardiovascular;;  right popliteal artery   POLYPECTOMY     TONSILLECTOMY     UPPER GASTROINTESTINAL ENDOSCOPY       Home Medications:  Prior to Admission medications   Medication Sig Start Date End Date Taking? Authorizing Provider  acetaminophen (TYLENOL) 500 MG tablet Take 1 tablet (500 mg total) by mouth every 6 (six) hours as needed. Patient taking differently: Take 500 mg by mouth every 6 (six) hours as needed for moderate pain. 03/27/22  Yes Derwood Kaplan, MD  amiodarone (PACERONE) 200 MG tablet Take 1 tablet (200 mg total) by mouth daily. 01/28/23  Yes Graciella Freer, [name]  aspirin EC 81 MG tablet Take 1 tablet (81 mg total) by mouth daily. 06/01/15  Yes Chilton Si, MD  atorvastatin (LIPITOR) 80 MG tablet Take 1 tablet (80 mg total) by mouth daily. 10/30/22  Yes Monge, Petra Kuba, NP  carvedilol (COREG) 12.5 MG tablet Take 1 tablet (12.5 mg total) by mouth 2 (two) times daily with a meal. Patient taking differently: Take 12.5 mg by mouth 2 (two) times daily with a meal. Per recent PCP visit: taking 0.5 tablets BID 01/27/23  Yes Donita Brooks, MD  cyanocobalamin (VITAMIN B12) 1000 MCG tablet Take 1,000 mcg by mouth daily.   Yes [provider]  diltiazem (CARDIZEM) 30 MG tablet TAKE 1 TABLET (30 MG TOTAL) BY MOUTH 3 (THREE) TIMES DAILY AS NEEDED (PALPITATIONS). 01/28/23 01/28/24  Yes Monge, Petra Kuba, NP  empagliflozin (JARDIANCE) 10 MG TABS tablet Take 1 tablet (10 mg total) by mouth daily. 10/30/22  Yes Monge, Petra Kuba, NP  fenofibrate 160 MG tablet Take 1 tablet (160 mg total) by mouth daily. 03/22/22  Yes Donita Brooks, MD  Iron, Ferrous Sulfate, 325 (65 Fe) MG TABS Take 325 mg by mouth daily. Patient taking differently: Take 325 mg by mouth in the morning and at bedtime. 01/22/23  Yes Donita Brooks, MD  Omega-3 Fatty Acids (FISH OIL) 1000 MG CAPS Take 1,000 mg by mouth 2 (two) times daily.   Yes [provider]  pantoprazole (PROTONIX) 40 MG tablet Take 1 tablet (40 mg total) by mouth 2 (two) times daily. 03/22/22  Yes Donita Brooks, MD  pioglitazone (ACTOS) 30 MG tablet Take 1 tablet (30 mg total) by mouth daily. 01/27/23  Yes Donita Brooks, MD  sitaGLIPtin (JANUVIA) 100 MG tablet Take 1 tablet (100 mg total) by mouth daily. 11/05/22  Yes Park Meo, FNP  solifenacin (VESICARE) 10 MG tablet Take 1 tablet (10 mg total) by mouth daily. 02/27/23  Yes Donita Brooks, MD  vedolizumab (ENTYVIO) 300 MG injection Inject 300 mg as directed See admin instructions. Every 2 months   Yes [provider]  apixaban (ELIQUIS) 5 MG TABS tablet Take 1 tablet (5 mg total) by mouth 2 (two) times daily. Patient not taking: Reported on 04/03/2023 01/08/23   Narda Bonds, MD  dicyclomine (BENTYL) 10 MG capsule Take 1 capsule (10 mg total) by mouth in the morning and at bedtime. Patient not taking: Reported on 04/03/2023 02/06/23   Meredith Pel, NP  furosemide (LASIX) 40 MG tablet TAKE 1 TABLET (40 MG TOTAL) BY MOUTH DAILY AS NEEDED FOR EDEMA. Patient not taking: Reported on 04/03/2023 01/27/23   Donita Brooks, MD  glucose blood (ONETOUCH ULTRA) test strip CHECK BLOOD SUGAR TWICE A DAY 03/19/21   Donita Brooks, MD  Insulin Pen Needle (B-D UF III MINI PEN NEEDLES) 31G X 5 MM MISC Use daily with Victoza 05/31/22   Donita Brooks, MD  mirabegron ER  (MYRBETRIQ) 25 MG TB24 tablet Take 1 tablet (25 mg total) by mouth daily. Patient not taking: Reported on 04/07/2023 03/31/23   Donita Brooks, MD  oxyCODONE (ROXICODONE) 5 MG immediate release tablet Take 1 tablet (5 mg total) by mouth every 4 (four) hours as needed for severe pain. Patient not taking: Reported on 04/07/2023 01/03/23   Donita Brooks, MD  sildenafil (VIAGRA) 100 MG tablet Take 0.5-1 tablets (50-100 mg total) by mouth daily as needed for erectile dysfunction. Patient not taking: Reported on 04/03/2023 05/30/22   Donita Brooks, MD    Inpatient Medications: Scheduled Meds:  amiodarone  200 mg Oral QHS   aspirin EC  81 mg Oral Daily   carvedilol  6.25 mg Oral BID WC   cyanocobalamin  1,000 mcg Oral Daily   empagliflozin  10 mg Oral Daily   fenofibrate  160 mg Oral Daily   ferrous sulfate  325 mg Oral Daily   fesoterodine  4 mg Oral Daily   furosemide  40 mg Intravenous BID   linagliptin  5 mg Oral Daily   omega-3 acid ethyl esters  1 g Oral BID WC   pantoprazole  40 mg Oral BID   pioglitazone  30 mg Oral Daily   potassium chloride  20 mEq Oral Daily   Continuous Infusions:  PRN Meds: acetaminophen **OR** acetaminophen, prochlorperazine  Allergies:    Allergies  Allergen Reactions   Penicillins Other (See Comments)    Unsure, childhood reaction Has patient had a PCN reaction causing immediate rash, facial/tongue/throat swelling, SOB or lightheadedness with hypotension: Unknown Has patient had a PCN reaction causing severe rash involving mucus membranes or skin necrosis: Unknown Has patient had a PCN reaction that required hospitalization: No Has patient had a PCN reaction occurring within the last 10 years: No If all of the above answers are "NO", then may proceed with Cephalosporin use.     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, building services  Tobacco Use   Smoking status: Former    Current packs/day: 0.00    Average packs/day: 1.5 packs/day for 25.0 years (37.5 ttl pk-yrs)    Types: Cigarettes    Start date: 08/13/1971    Quit date: 08/12/1996    Years since quitting: 26.6   Smokeless tobacco: Never   Tobacco comments:    Former smoker 09/17/2022  Vaping Use   Vaping status: Never Used  Substance and Sexual Activity   Alcohol use: Yes    Alcohol/week: 1.0 standard drink of alcohol    Types: 1 Cans of beer per week    Comment: social use   Drug use: No   Sexual activity: Yes  Other Topics Concern   Not on file  Social History Narrative   ** Merged History Encounter **       Social Determinants of Health   Financial Resource Strain: Low Risk  (01/23/2023)   Overall Financial Resource Strain (CARDIA)    Difficulty of Paying Living Expenses: Not hard at all  Food Insecurity: No Food Insecurity (04/07/2023)   Hunger Vital Sign    Worried About Running Out of Food in the Last Year: Never true    Ran Out of Food in the Last Year: Never true  Transportation Needs: No Transportation Needs (04/07/2023)   PRAPARE - Administrator, Civil Service (Medical): No    Lack of Transportation (Non-Medical): No  Physical Activity: Insufficiently Active (01/23/2023)   Exercise Vital Sign    Days of Exercise per Week: 3 days    Minutes of Exercise per Session: 30 min  Stress: No Stress Concern Present (01/23/2023)   [employer] of Occupational Health - Occupational Stress Questionnaire    Feeling of Stress : Only a little  Social Connections: Socially Integrated (01/23/2023)   Social Connection and Isolation Panel [NHANES]    Frequency of Communication with Friends and Family: More than three times a week    Frequency of Social Gatherings with Friends and Family: Three times a week     Attends Religious Services: More than 4 times per year    Active Member of Clubs or Organizations: Yes    Attends Banker Meetings: 1 to 4 times per year    Marital Status: Married  Catering manager Violence: Not At Risk (04/07/2023)   Humiliation, Afraid, Rape, and Kick questionnaire    Fear of Current or Ex-Partner: No    Emotionally Abused: No    Physically Abused: No    Sexually Abused: No    Family History:    Family History  Problem Relation Age of Onset   Colon cancer Maternal Grandmother        in her 13's   Colon polyps Mother        in her 17's   Diabetes Mother    Heart disease Father    Diabetes Maternal Aunt        Aunts and Uncles   Esophageal cancer Neg Hx    Stomach cancer Neg Hx    Rectal cancer Neg Hx      ROS:  Please see the history of present illness.   All other ROS reviewed and negative.  Physical Exam/Data:   Vitals:   04/07/23 2043 04/08/23 0103 04/08/23 0406 04/08/23 0619  BP: 100/71 115/65 118/69   Pulse: 72 70 63   Resp: 18 16 19    Temp: 100.1 F (37.8 C) 99.2 F (37.3 C) 98.9 F (37.2 C)   TempSrc: Oral Oral Oral   SpO2: 97% 97% 97%   Weight:    95.3 kg  Height:        Intake/Output Summary (Last 24 hours) at 04/08/2023 0748 Last data filed at 04/07/2023 2100 Gross per 24 hour  Intake 300 ml  Output 1800 ml  Net -1500 ml      04/08/2023    6:19 AM 04/07/2023    1:20 PM 04/07/2023    1:18 PM  Last 3 Weights  Weight (lbs) 210 lb 1.6 oz 218 lb 8 oz 218 lb 8 oz  Weight (kg) 95.3 kg 99.111 kg 99.111 kg     Body mass index is 30.15 kg/m.  General:  Well nourished, well developed, in no acute distress HEENT: normal Neck: no JVD Vascular: No carotid bruits; Distal pulses 2+ bilaterally Cardiac:  normal S1, S2; RRR, 3/6 systolic murmur Lungs:  clear to auscultation bilaterally, no wheezing, rhonchi or rales  Abd: soft, nontender, no hepatomegaly  Ext: 2+ B LE R>L  Musculoskeletal:  No deformities, BUE and  BLE strength normal and equal Skin: warm and dry  Neuro:  CNs 2-12 intact, no focal abnormalities noted Psych:  Normal affect   EKG:  The EKG was personally reviewed and demonstrates:  SR HR 69 QTC 486 ms Telemetry:  Telemetry was personally reviewed and demonstrates:  SR HR 80s  Relevant CV Studies:  Echo 04/07/2023 IMPRESSIONS:    1. Left ventricular ejection fraction, by estimation, is 45 to 50%. The left ventricle has mildly decreased function. The left ventricle demonstrates global hypokinesis. Left ventricular diastolic parameters are indeterminate.  2. Right ventricular systolic function is normal. The right ventricular size is normal. There is mildly elevated pulmonary artery systolic pressure. The estimated right ventricular systolic pressure is 39.5 mmHg.  3. Left atrial size was severely dilated.  4. Right atrial size was mildly dilated.  5. The mitral valve is normal in structure. Mild to moderate mitral valve regurgitation. No evidence of mitral stenosis.  6. The aortic valve is bicuspid. There is moderate calcification of the aortic valve. There is moderate thickening of the aortic valve. Aortic valve regurgitation is not visualized. Aortic valve sclerosis is present, with no evidence of aortic valve stenosis.  7. Aortic dilatation noted. There is mild dilatation of the aortic root, measuring 43 mm. There is mild dilatation of the ascending aorta, measuring 41 mm.  8. The inferior vena cava is normal in size with greater than 50% respiratory variability, suggesting right atrial pressure of 3 mmHg.  Laboratory Data:  High Sensitivity Troponin:  No results for input(s): "TROPONINIHS" in the last 720 hours.   Chemistry Recent Labs  Lab 04/07/23 0943 04/08/23 0410  NA 134* 132*  K 4.5 4.0  CL 101 101  CO2 25 23  GLUCOSE 153* 109*  BUN 20 25*  CREATININE 1.42* 1.52*  CALCIUM 8.1* 7.9*  GFRNONAA 54* 50*  ANIONGAP 8 8    Recent Labs  Lab 04/07/23 0943  04/08/23 0410  PROT 5.8* 5.8*  ALBUMIN 2.6* 1.8*  AST 144* 107*  ALT 27 26  ALKPHOS 207* 187*  BILITOT 3.9* 3.8*   Lipids No results for input(s): "CHOL", "TRIG", "HDL", "LABVLDL", "LDLCALC", "  CHOLHDL" in the last 168 hours.  Hematology Recent Labs  Lab 04/07/23 0943 04/08/23 0410  WBC 11.8* 10.8*  RBC 3.74* 3.23*  HGB 10.4* 9.1*  HCT 33.5* 29.0*  MCV 89.6 89.8  MCH 27.8 28.2  MCHC 31.0 31.4  RDW 19.9* 19.9*  PLT 345 300   Thyroid No results for input(s): "TSH", "FREET4" in the last 168 hours.  BNP Recent Labs  Lab 04/07/23 0943  BNP 1,374.1*    DDimer No results for input(s): "DDIMER" in the last 168 hours.   Radiology/Studies:  [facility] Chest Port 1 View  Result Date: 04/07/2023 CLINICAL DATA:  Acute dyspnea. EXAM: PORTABLE CHEST 1 VIEW COMPARISON:  Chest radiographs 10/05/2022 and 03/27/2022 FINDINGS: Single frontal view of the chest. Cardiac silhouette is at the upper limits of normal size, unchanged. Mediastinal contours are within normal limits. The lungs are clear. No pleural effusion pneumothorax. No acute skeletal abnormality. IMPRESSION: No acute cardiopulmonary process. Electronically Signed   By: Neita Garnet M.D.   On: 04/07/2023 15:56     Assessment and Plan:   Acute on chronic systolic and diastolic heart failure Mild to moderate MR Hepatocarcinoma Hypoalbuminemia Elevated LFTs  - recent echo with LVEF 45-50% with prior echo consistent with grade 2 DD - was taking PO lasix at home, but recently stopped - BNP 1374 - has received 40 mg IV lasix x 2 doses with 1800 ml urine output charted and weight down 8 lbs - he has significant LE edema and abdominal distension - continue IV lasix with gentle fluid removal - will increase to 80 mg IV lasix BID - do not think we can add spironolactone to his regimen given renal function and need for diuresis - I think volume status is complicated by renal function, liver function, and low albumin  - recommend  compression stockings and ambulation as tolerated, he is significantly weak after radiation therapy   Acute on chronic kidney disease III - baseline 1.0-1.3 - sCr 1.52 (1.42), K 4.0 - monitor with diuresis   Persistent atrial fibrillation - now on amiodarone 200 mg daily with coreg, PRN cardizem - QTC 487 ms - maintaining sinus rhythm on telemetry   CAD - known D1 stenosis, intervention c/b coronary perforation - treated medically - no current chest pain   PAD - multiple prior interventions    Risk Assessment/Risk Scores:       New York Heart Association (NYHA) Functional Class NYHA Class IV       For questions or updates, please contact [facility] Please consult [email or web address] for contact info under    Signed, Marcelino Duster, [place]  04/08/2023 7:48 AM

## 2023-04-08 NOTE — Evaluation (Signed)
Physical Therapy Brief Evaluation  Patient Details Name: Matthew Chen MRN: [id number] DOB: [birth year] Today's Date: 04/08/2023   History of Present Illness  67 y.o. male with PMH significant for HCC, obesity, DM2, HTN, HLD, paroxysmal A-fib not on anticoagulation, CAD, chronic systolic CHF, PAD, bicuspid aortic valve, thoracic aortic aneurysm, CKD, ulcerative colitis.  Patient follows up with oncology Dr. Truett Perna for [facility].    8/9, he underwent Y90 (radioembolization) of left lobe of liver by IR     8/26, patient had a scheduled routine follow-up at cancer center.  He reported progressive shortness of breath, pedal edema, abdominal distention and generalized weakness for 1 week.  Patient reported that for the past 1 week, he has not been taking Lasix and also been drinking a lot of water.  Dx of acute on chronic CHF.  Clinical Impression  Pt is mobilizing well at an independent level. He ambulated 350' without an assistive device, no loss of balance, SpO2 95% on room air walking. He is safe to ambulate in the halls independently and was encouraged to do so TID to minimize deconditioning during hospitalization.   No further PT indicated, will sign off.      PT Assessment    Assistance Needed at Discharge       Equipment Recommendations None recommended by PT  Recommendations for Other Services       Precautions/Restrictions Precautions Precautions: None Restrictions Weight Bearing Restrictions: No        Mobility  Bed Mobility          Transfers Overall transfer level: Independent                      Ambulation/Gait Ambulation/Gait assistance: Independent Gait Distance (Feet): 350 Feet Assistive device: None Gait Pattern/deviations: WFL(Within Functional Limits)   General Gait Details: steady, no loss of balance, SpO2 95% on room air  Home Activity Instructions    Stairs            Modified Rankin (Stroke Patients Only)        Balance                           Pertinent Vitals/Pain   Pain Assessment Pain Assessment: No/denies pain     Home Living   Living Arrangements: Spouse/significant other       Home Equipment: Cane - single point        Prior Function        UE/LE Assessment               Communication   Communication Communication: No apparent difficulties     Cognition         General Comments      Exercises     Assessment/Plan    PT Problem List         PT Visit Diagnosis      No Skilled PT Patient is independent with all acitivity/mobility   Co-evaluation                AMPAC 6 Clicks Help needed turning from your back to your side while in a flat bed without using bedrails?: None Help needed moving from lying on your back to sitting on the side of a flat bed without using bedrails?: None Help needed moving to and from a bed to a chair (including a wheelchair)?: None Help needed standing up from a chair using your arms (e.g.,  wheelchair or bedside chair)?: None Help needed to walk in hospital room?: None Help needed climbing 3-5 steps with a railing? : None 6 Click Score: 24      End of Session   Activity Tolerance: Patient tolerated treatment well Patient left: in chair;with call bell/phone within reach;with family/visitor present Nurse Communication: Mobility status       Time: 1610-9604 PT Time Calculation (min) (ACUTE ONLY): 12 min  Charges:   PT Evaluation $PT Eval Low Complexity: 1 Low      Tamala Ser PT 04/08/2023  Acute Rehabilitation Services  Office [phone]

## 2023-04-08 NOTE — Progress Notes (Signed)
PROGRESS NOTE  Matthew Chen  DOB: 1956-04-29  PCP: Donita Brooks, MD OZD:[id number]  DOA: 04/07/2023  LOS: 1 day  Hospital Day: 2  Brief narrative: Matthew Chen is a 67 y.o. male with PMH significant for HCC, obesity, DM2, HTN, HLD, paroxysmal A-fib not on anticoagulation, CAD, chronic systolic CHF, PAD, bicuspid aortic valve, thoracic aortic aneurysm, CKD, ulcerative colitis. Patient follows up with oncology Dr. Truett Perna for [facility].   8/9, he underwent Y90 (radioembolization) of left lobe of liver by IR  8/26, patient had a scheduled routine follow-up at cancer center.  He reported progressive shortness of breath, pedal edema, abdominal distention and generalized weakness for 1 week.  Patient reported that for the past 1 week, he has not been taking Lasix and also been drinking a lot of water.  Initial vital signs unremarkable Labs showed WBC count 11.8, hemoglobin 10.4, BNP over 1300, sodium 134, BUN/creatinine 20/1.42, LFTs elevated with total bilirubin 3.9 He was hospitalized for further evaluation and management.  Subjective: Patient was seen and examined this morning.  Pleasant elderly Caucasian male.  Lying in bed.  Not in distress.  Wife at bedside. Currently on IV Lasix. Reports drops of blood after urination. Chart reviewed Vital signs stable. Repeat labs this morning showed hemoglobin down to 9.1, bilirubin remains elevated at 3.8  Assessment and plan: Acute on chronic systolic CHF Essential hypertension Presented with symptoms of volume overload status (shortness of breath, pedal edema, abdominal distention), reports noncompliance to diuretics.   Most recent echo from 8/13 with EF 45 to 50%, global hypokinesis, mild to moderate mitral regurg, severely dilated left atrium, mildly elevated pulmonary artery systolic pressure PTA meds include carvedilol 6.25 mg twice daily, Lasix He has been started on Lasix 40 mg IV twice daily.  Noted plan for cardiology to increase Lasix  to 80 mg twice daily. Compression stockings ordered. Net IO Since Admission: -1,780 mL [04/08/23 1355] Continue to monitor for daily intake output, weight, blood pressure, BNP, renal function and electrolytes. Recent Labs  Lab 04/07/23 0943 04/08/23 0410  BNP 1,374.1*  --   BUN 20 25*  CREATININE 1.42* 1.52*  NA 134* 132*  K 4.5 4.0   CKD stage 2 Creatinine was normal at 0.94 in July.  Currently with creatinine elevated to 1.52 Monitor on diuresis  Acute hyponatremia Likely combination of fluid retention, low solute intake Continue to monitor with diuresis.  Hepatocellular carcinoma Elevated LFTs New hyperbilirubinemia Patient follows up with oncology Dr. Truett Perna for [facility]   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  Likely secondary to [facility] as well as amiodarone. Last seen by cardiologist Dr. Elberta Fortis on 8/22.  He had a plan to recheck LFTs in 3 months. Labs on admission shows significant rise in AST and bilirubin level.  Albumin low, INR 1.5. To be seen by oncology Dr. Truett Perna Recent Labs  Lab 04/07/23 [phone] 04/08/23 0410  AST 144* 107*  ALT 27 26  ALKPHOS 207* 187*  BILITOT 3.9* 3.8*  PROT 5.8* 5.8*  ALBUMIN 2.6* 1.8*  INR  --  1.5*  PLT 345 300   Paroxysmal A-fib PTA meds-carvedilol 6.25 mg p.o. twice daily, Cardizem 30 mg 3 times daily as needed Continue Coreg 6.25 mg twice daily. Was previously anticoagulated with Eliquis.  Reports he has been taken off of it for at least 2 months now.  Type 2 diabetes mellitus A1c 7 on March 2024 PTA  meds-Jardiance 10 mg daily, Actos 30 mg daily, Januvia 100 mg daily Continue all for now. Recent Labs  Lab 04/07/23 1741 04/07/23 2051 04/08/23 0748 04/08/23 1155  GLUCAP 110* 137* 98 134*   CAD/PAD/HLD Continue continue aspirin and beta-blocker. Statin held due to worsening of the liver function. Continue fenofibrate  Mild hematuria Patient reported  drops of blood after urination today.  He is wife showed me a picture of it.  Had not noticed that at home Urinalysis at presentation showed large amount of hemoglobin. Most recent CT pelvis from 5/24 did not show any remarkable findings in the visualized ureter and urinary bladder. Continue to monitor.  Chronic anemia Continue PPI, iron supplement,  Recent Labs    02/11/23 1357 02/11/23 1358 03/06/23 0846 03/07/23 0819 03/21/23 0820 04/07/23 0943 04/08/23 0410  HGB 8.2*  --  9.8* 9.1* 10.6* 10.4* 9.1*  MCV 96.3  --  89.6 91.6 92.5 89.6 89.8  FERRITIN  --  243  --   --   --   --   --   RETICCTPCT 3.4*  --   --   --   --   --   --    Mobility: PT eval ordered  Goals of care   Code Status: Full Code     DVT prophylaxis: SCDs Place and maintain sequential compression device Start: 04/08/23 1350   Antimicrobials: None Fluid: None Consultants: Cardiology Family Communication: Wife at bedside  Status: Observation Level of care:  Telemetry   Patient is from: Home Needs to continue in-hospital care: IV diuresis, renal function monitoring Anticipated d/c to: Home in 2 to 3 days   Diet:  Diet Order             Diet heart healthy/carb modified Room service appropriate? Yes; Fluid consistency: Thin; Fluid restriction: 1200 mL Fluid  Diet effective now                   Scheduled Meds:  amiodarone  200 mg Oral QHS   aspirin EC  81 mg Oral Daily   carvedilol  6.25 mg Oral BID WC   cyanocobalamin  1,000 mcg Oral Daily   empagliflozin  10 mg Oral Daily   fenofibrate  160 mg Oral Daily   ferrous sulfate  325 mg Oral Daily   fesoterodine  4 mg Oral Daily   furosemide  80 mg Intravenous BID   linagliptin  5 mg Oral Daily   omega-3 acid ethyl esters  1 g Oral BID WC   pantoprazole  40 mg Oral BID   pioglitazone  30 mg Oral Daily   potassium chloride  20 mEq Oral Daily    PRN meds: acetaminophen **OR** acetaminophen, prochlorperazine   Infusions:     Antimicrobials: Anti-infectives (From admission, onward)    None       Nutritional status:  Body mass index is 30.15 kg/m.          Objective: Vitals:   04/08/23 0749 04/08/23 1312  BP: 120/64 99/63  Pulse: 62 70  Resp:    Temp:  98.9 F (37.2 C)  SpO2:  100%    Intake/Output Summary (Last 24 hours) at 04/08/2023 1355 Last data filed at 04/08/2023 1100 Gross per 24 hour  Intake 420 ml  Output 2200 ml  Net -1780 ml   Filed Weights   04/07/23 1318 04/07/23 1320 04/08/23 0619  Weight: 99.1 kg 99.1 kg 95.3 kg   Weight change:  Body mass index is  30.15 kg/m.   Physical Exam: General exam: Pleasant, elderly Caucasian male.  Not in distress Skin: No rashes, lesions or ulcers. HEENT: Atraumatic, normocephalic, no obvious bleeding Lungs: Clear to auscultation bilaterally CVS: Regular rate and rhythm, no murmur GI/Abd soft, nontender, mild distention present, bowel sound present CNS: Alert, awake, oriented x 3 Psychiatry: Mood appropriate Extremities: 1+ bilateral pedal edema, no calf tenderness  Data Review: I have personally reviewed the laboratory data and studies available.  F/u labs ordered Unresulted Labs (From admission, onward)     Start     Ordered   Unscheduled  CBC with Differential/Platelet  Daily,   R      04/08/23 1355   Unscheduled  Comprehensive metabolic panel  Daily,   R      04/08/23 1355   Unscheduled  Protime-INR  Tomorrow morning,   R        04/08/23 1355            Total time spent in review of labs and imaging, patient evaluation, formulation of plan, documentation and communication with family: 55 minutes  Signed, Lorin Glass, MD [facility] 04/08/2023

## 2023-04-08 NOTE — Plan of Care (Signed)
  Problem: Safety: Goal: Ability to remain free from injury will improve Outcome: Progressing   Problem: Education: Goal: Ability to verbalize understanding of medication therapies will improve Outcome: Progressing   Problem: Skin Integrity: Goal: Risk for impaired skin integrity will decrease Outcome: Progressing

## 2023-04-09 ENCOUNTER — Inpatient Hospital Stay
Admission: RE | Admit: 2023-04-09 | Discharge: 2023-04-09 | Disposition: A | Discharge status: Home / Self Care | Payer: PPO | Source: Ambulatory Visit | Attending: Interventional Radiology | Admitting: Interventional Radiology

## 2023-04-09 DIAGNOSIS — I4819 Other persistent atrial fibrillation: Secondary | ICD-10-CM | POA: Diagnosis not present

## 2023-04-09 DIAGNOSIS — I5043 Acute on chronic combined systolic (congestive) and diastolic (congestive) heart failure: Secondary | ICD-10-CM | POA: Diagnosis not present

## 2023-04-09 DIAGNOSIS — C220 Liver cell carcinoma: Secondary | ICD-10-CM

## 2023-04-09 DIAGNOSIS — I5023 Acute on chronic systolic (congestive) heart failure: Secondary | ICD-10-CM | POA: Diagnosis not present

## 2023-04-09 DIAGNOSIS — N179 Acute kidney failure, unspecified: Secondary | ICD-10-CM | POA: Diagnosis not present

## 2023-04-09 DIAGNOSIS — N183 Chronic kidney disease, stage 3 unspecified: Secondary | ICD-10-CM | POA: Diagnosis not present

## 2023-04-09 LAB — PROTIME-INR
INR: 1.4 — ABNORMAL HIGH (ref 0.8–1.2)
Prothrombin Time: 17.5 s — ABNORMAL HIGH (ref 11.4–15.2)

## 2023-04-09 LAB — COMPREHENSIVE METABOLIC PANEL
ALT: 25 U/L (ref 0–44)
AST: 69 U/L — ABNORMAL HIGH (ref 15–41)
Albumin: 1.7 g/dL — ABNORMAL LOW (ref 3.5–5.0)
Alkaline Phosphatase: 185 U/L — ABNORMAL HIGH (ref 38–126)
Anion gap: 13 (ref 5–15)
BUN: 31 mg/dL — ABNORMAL HIGH (ref 8–23)
CO2: 25 mmol/L (ref 22–32)
Calcium: 8.3 mg/dL — ABNORMAL LOW (ref 8.9–10.3)
Chloride: 102 mmol/L (ref 98–111)
Creatinine, Ser: 1.42 mg/dL — ABNORMAL HIGH (ref 0.61–1.24)
GFR, Estimated: 54 mL/min — ABNORMAL LOW (ref >60)
Glucose, Bld: 101 mg/dL — ABNORMAL HIGH (ref 70–99)
Potassium: 3.4 mmol/L — ABNORMAL LOW (ref 3.5–5.1)
Sodium: 140 mmol/L (ref 135–145)
Total Bilirubin: 3.6 mg/dL — ABNORMAL HIGH (ref 0.3–1.2)
Total Protein: 5.8 g/dL — ABNORMAL LOW (ref 6.5–8.1)

## 2023-04-09 LAB — CBC WITH DIFFERENTIAL/PLATELET
Abs Immature Granulocytes: 0.05 10*3/uL (ref 0.00–0.07)
Basophils Absolute: 0 10*3/uL (ref 0.0–0.1)
Basophils Relative: 0 %
Eosinophils Absolute: 0.1 10*3/uL (ref 0.0–0.5)
Eosinophils Relative: 1 %
HCT: 30.2 % — ABNORMAL LOW (ref 39.0–52.0)
Hemoglobin: 9.5 g/dL — ABNORMAL LOW (ref 13.0–17.0)
Immature Granulocytes: 1 %
Lymphocytes Relative: 5 %
Lymphs Abs: 0.5 10*3/uL — ABNORMAL LOW (ref 0.7–4.0)
MCH: 27.7 pg (ref 26.0–34.0)
MCHC: 31.5 g/dL (ref 30.0–36.0)
MCV: 88 fL (ref 80.0–100.0)
Monocytes Absolute: 1 10*3/uL (ref 0.1–1.0)
Monocytes Relative: 10 %
Neutro Abs: 8.3 10*3/uL — ABNORMAL HIGH (ref 1.7–7.7)
Neutrophils Relative %: 83 %
Platelets: 301 10*3/uL (ref 150–400)
RBC: 3.43 MIL/uL — ABNORMAL LOW (ref 4.22–5.81)
RDW: 19.9 % — ABNORMAL HIGH (ref 11.5–15.5)
WBC: 10.1 10*3/uL (ref 4.0–10.5)
nRBC: 0 % (ref 0.0–0.2)

## 2023-04-09 MED ORDER — AMIODARONE HCL IN DEXTROSE 360-4.14 MG/200ML-% IV SOLN
30.0000 mg/h | INTRAVENOUS | Status: DC
  Administered 2023-04-09 – 2023-04-10 (×3): 30 mg/h via INTRAVENOUS
  Filled 2023-04-09 (×2): qty 200

## 2023-04-09 MED ORDER — METOPROLOL TARTRATE 25 MG PO TABS
25.0000 mg | ORAL_TABLET | Freq: Four times a day (QID) | ORAL | Status: DC
  Administered 2023-04-09 – 2023-04-10 (×5): 25 mg via ORAL
  Filled 2023-04-09 (×5): qty 1

## 2023-04-09 MED ORDER — AMIODARONE LOAD VIA INFUSION
150.0000 mg | Freq: Once | INTRAVENOUS | Status: AC
  Administered 2023-04-09: 150 mg via INTRAVENOUS
  Filled 2023-04-09: qty 83.34

## 2023-04-09 MED ORDER — AMIODARONE HCL IN DEXTROSE 360-4.14 MG/200ML-% IV SOLN
60.0000 mg/h | INTRAVENOUS | Status: AC
  Administered 2023-04-09 (×2): 60 mg/h via INTRAVENOUS
  Filled 2023-04-09 (×2): qty 200

## 2023-04-09 MED ORDER — ENSURE ENLIVE PO LIQD
237.0000 mL | Freq: Two times a day (BID) | ORAL | Status: DC
  Administered 2023-04-09 – 2023-04-11 (×3): 237 mL via ORAL

## 2023-04-09 NOTE — Progress Notes (Signed)
Initial Nutrition Assessment  INTERVENTION:   -Ensure Plus High Protein po BID, each supplement provides 350 kcal and 20 grams of protein.   NUTRITION DIAGNOSIS:   Increased nutrient needs related to cancer and cancer related treatments as evidenced by estimated needs.  GOAL:   Patient will meet greater than or equal to 90% of their needs  MONITOR:   PO intake, Supplement acceptance, Labs, Weight trends, I & O's  REASON FOR ASSESSMENT:   Consult Assessment of nutrition requirement/status  ASSESSMENT:   67 y.o. male with PMH significant for HCC, obesity, DM2, HTN, HLD, paroxysmal A-fib not on anticoagulation, CAD, chronic systolic CHF, PAD, bicuspid aortic valve, thoracic aortic aneurysm, CKD, ulcerative colitis.  Patient follows up with oncology Dr. Truett Perna for [facility].    8/9, he underwent Y90 (radioembolization) of left lobe of liver by IR.  Patient in room, wife at bedside. Pt reports he ate tomato soup and grilled cheese for lunch. Reports that he has had a good appetite. He has had good blood sugar control. Denies any issues with swallowing, chewing or taste. He is open to drinking Ensure supplements given his increased needs.  Pt reports UBW ~193 lbs. Admission weight: 218 lbs Current weight; 204 lbs -r/t fluid, on Lasix  Medications: Vitamin B-12, Ferrous sulfate, Lasix, Lovaza, KLOR-CON  Labs reviewed: CBGs: 98-137 Low K   NUTRITION - FOCUSED PHYSICAL EXAM:  Flowsheet Row Most Recent Value  Orbital Region Moderate depletion  Upper Arm Region Moderate depletion  Thoracic and Lumbar Region Unable to assess  Buccal Region Moderate depletion  Temple Region Moderate depletion  Clavicle Bone Region Unable to assess  Clavicle and Acromion Bone Region Unable to assess  Scapular Bone Region No depletion  Dorsal Hand No depletion  Patellar Region Unable to assess  Anterior Thigh Region Unable to assess  Posterior Calf Region Unable to assess  Edema (RD Assessment)  Moderate  Hair Reviewed  Eyes Reviewed  Mouth Reviewed       Diet Order:   Diet Order             Diet heart healthy/carb modified Room service appropriate? Yes; Fluid consistency: Thin; Fluid restriction: 1200 mL Fluid  Diet effective now                   EDUCATION NEEDS:   No education needs have been identified at this time  Skin:  Skin Assessment: Reviewed RN Assessment  Last BM:  8/28  Height:   Ht Readings from Last 1 Encounters:  04/07/23 5\' 10"  (1.778 m)    Weight:   Wt Readings from Last 1 Encounters:  04/09/23 92.6 kg    BMI:  Body mass index is 29.3 kg/m.  Estimated Nutritional Needs:   Kcal:  2200-2400  Protein:  110-120g  Fluid:  Per MD: 1.5L/day   Matthew Franco, MS, RD, LDN Inpatient Clinical Dietitian Contact information available via Amion

## 2023-04-09 NOTE — Progress Notes (Signed)
PROGRESS NOTE  Matthew Chen  DOB: 05-25-1956  PCP: Donita Brooks, MD KDT:[id number]  DOA: 04/07/2023  LOS: 2 days  Hospital Day: 3  Brief narrative: Matthew Chen is a 67 y.o. male with PMH significant for HCC, obesity, DM2, HTN, HLD, paroxysmal A-fib not on anticoagulation, CAD, chronic systolic CHF, PAD, bicuspid aortic valve, thoracic aortic aneurysm, CKD, ulcerative colitis. Patient follows up with oncology Dr. Truett Perna for [facility].   8/9, he underwent Y90 (radioembolization) of left lobe of liver by IR  8/26, patient had a scheduled routine follow-up at cancer center.  He reported progressive shortness of breath, pedal edema, abdominal distention and generalized weakness for 1 week.  Patient reported that for the past 1 week, he has not been taking Lasix and also been drinking a lot of water.  Initial vital signs unremarkable Labs showed WBC count 11.8, hemoglobin 10.4, BNP over 1300, sodium 134, BUN/creatinine 20/1.42, LFTs elevated with total bilirubin 3.9 He was hospitalized for further evaluation and management.  Subjective: Patient was seen and examined this morning. Lying on bed.  Not in distress.  Breathing better.  Was able to ambulate on the hallway today.  Swelling improving. Hematuria has stopped. He is in A-fib with RVR since last night.    Assessment and plan: Acute on chronic systolic CHF Essential hypertension Presented with symptoms of volume overload status (shortness of breath, pedal edema, abdominal distention), reports noncompliance to diuretics.   Most recent echo from 8/13 with EF 45 to 50%, global hypokinesis, mild to moderate mitral regurg, severely dilated left atrium, mildly elevated pulmonary artery systolic pressure PTA meds include carvedilol 6.25 mg twice daily, Lasix He has been started on Lasix 80 mg IV twice daily.  More than 6 L of output since admission. Net IO Since Admission: -6,020 mL [04/09/23 1354] Continue to monitor for daily intake  output, weight, blood pressure, BNP, renal function and electrolytes. Recent Labs  Lab 04/07/23 0943 04/08/23 0410 04/09/23 0415  BNP 1,374.1*  --   --   BUN 20 25* 31*  CREATININE 1.42* 1.52* 1.42*  NA 134* 132* 140  K 4.5 4.0 3.4*   A-fib with RVR  H/o paroxysmal A-fib Patient is in A-fib with RVR since last night.   PTA meds-carvedilol 6.25 mg p.o. twice daily, amiodarone 200 mg daily Cardizem 30 mg 3 times daily as needed Cardiologist switched from carvedilol to metoprolol.  Started IV amiodarone drip.  Continue to monitor daily.  If no improvement, may need digoxin. Was previously anticoagulated with Eliquis.  Reports he has been taken off of it for at least 2 months now.  CKD stage 2 Creatinine was normal at 0.94 in July.  Currently with creatinine elevated to 1.52 Monitor on diuresis  Acute hyponatremia Likely combination of fluid retention, low solute intake Improving.  Hepatocellular carcinoma Elevated LFTs New hyperbilirubinemia Patient follows up with oncology Dr. Truett Perna for [facility].   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  Likely secondary to [facility] as well as amiodarone. Last seen by cardiologist Dr. Elberta Fortis on 8/22.  He had a plan to recheck LFTs in 3 months. Labs on admission shows significant rise in AST and bilirubin level.  Oncology consult appreciated.  Rising LFT and bilirubin likely due to radioembolization last week. Albumin low, INR 1.5. To be seen by oncology Dr. Truett Perna Recent Labs  Lab 04/07/23 [phone] 04/08/23 0410 04/09/23 0415  AST 144* 107* 69*  ALT 27 26 25   ALKPHOS 207* 187* 185*  BILITOT 3.9* 3.8* 3.6*  PROT 5.8* 5.8* 5.8*  ALBUMIN 2.6* 1.8* 1.7*  INR  --  1.5* 1.4*  PLT 345 300 301   Hypoalbuminemia Malnutrition Albumin level low at 1.7.  ID consult appreciated.  Started on supplements.  Type 2 diabetes mellitus A1c 7 on March 2024 PTA meds-Jardiance 10 mg daily,  Actos 30 mg daily, Januvia 100 mg daily Continue all for now. Recent Labs  Lab 04/07/23 1741 04/07/23 2051 04/08/23 0748 04/08/23 1155  GLUCAP 110* 137* 98 134*   CAD/PAD/HLD Continue continue aspirin and beta-blocker. Statin held due to worsening of the liver function. Continue fenofibrate  Mild hematuria 8/27, patient reported drops of blood after urination today.  He is wife showed me a picture of it.  Had not noticed that at home Urinalysis at presentation showed large amount of hemoglobin. Most recent CT pelvis from 5/24 did not show any remarkable findings in the visualized ureter and urinary bladder. Patient states that hematuria has stopped.  Continue to monitor.  If recurs, will get urology consultation.  Chronic anemia Continue PPI, iron supplement,  Recent Labs    02/11/23 1357 02/11/23 1358 03/06/23 0846 03/07/23 0819 03/21/23 0820 04/07/23 0943 04/08/23 0410 04/09/23 0415  HGB 8.2*  --    < > 9.1* 10.6* 10.4* 9.1* 9.5*  MCV 96.3  --    < > 91.6 92.5 89.6 89.8 88.0  FERRITIN  --  243  --   --   --   --   --   --   RETICCTPCT 3.4*  --   --   --   --   --   --   --    < > = values in this interval not displayed.   Mobility: PT eval obtained.  No follow-up recommended.  Goals of care   Code Status: Full Code     DVT prophylaxis: SCDs Place and maintain sequential compression device Start: 04/08/23 1350   Antimicrobials: None Fluid: None Consultants: Cardiology Family Communication: Wife at bedside  Status: Inpatient Level of care:  Telemetry   Patient is from: Home Needs to continue in-hospital care: IV diuresis to continue.  Started on IV amiodarone. Anticipated d/c to: Home in 2 to 3 days   Diet:  Diet Order             Diet heart healthy/carb modified Room service appropriate? Yes; Fluid consistency: Thin; Fluid restriction: 1200 mL Fluid  Diet effective now                   Scheduled Meds:  aspirin EC  81 mg Oral Daily    cyanocobalamin  1,000 mcg Oral Daily   empagliflozin  10 mg Oral Daily   feeding supplement  237 mL Oral BID BM   fenofibrate  160 mg Oral Daily   ferrous sulfate  325 mg Oral Daily   fesoterodine  4 mg Oral Daily   furosemide  80 mg Intravenous BID   linagliptin  5 mg Oral Daily   metoprolol tartrate  25 mg Oral Q6H   omega-3 acid ethyl esters  1 g Oral BID WC   pantoprazole  40 mg Oral BID   pioglitazone  30 mg Oral Daily   potassium chloride  20 mEq Oral Daily    PRN meds: acetaminophen **OR** acetaminophen, prochlorperazine   Infusions:   amiodarone 60 mg/hr (04/09/23 1151)   Followed by  amiodarone      Antimicrobials: Anti-infectives (From admission, onward)    None       Nutritional status:  Body mass index is 29.3 kg/m.  Nutrition Problem: Increased nutrient needs Etiology: cancer and cancer related treatments Signs/Symptoms: estimated needs     Objective: Vitals:   04/08/23 2131 04/09/23 0557  BP: 101/70 112/70  Pulse:  75  Resp: 20 20  Temp: 98.6 F (37 C) 98.3 F (36.8 C)  SpO2: 97% 96%    Intake/Output Summary (Last 24 hours) at 04/09/2023 1354 Last data filed at 04/09/2023 0500 Gross per 24 hour  Intake 410 ml  Output 4650 ml  Net -4240 ml   Filed Weights   04/07/23 1320 04/08/23 0619 04/09/23 0500  Weight: 99.1 kg 95.3 kg 92.6 kg   Weight change: -6.486 kg Body mass index is 29.3 kg/m.   Physical Exam: General exam: Pleasant, elderly Caucasian male.  Not in distress Skin: No rashes, lesions or ulcers. HEENT: Atraumatic, normocephalic, no obvious bleeding Lungs: Clear to auscultation bilaterally CVS: Irregular fast rhythm, no murmur GI/Abd soft, nontender, mild distention present, bowel sound present CNS: Alert, awake, oriented x 3 Psychiatry: Anxious with RVR Extremities: Improving bilateral pedal edema, no calf tenderness  Data Review: I have personally reviewed the laboratory data and studies available.  F/u labs  ordered Unresulted Labs (From admission, onward)     Start     Ordered   04/09/23 0500  CBC with Differential/Platelet  Daily,   R      04/08/23 1355   04/09/23 0500  Comprehensive metabolic panel  Daily,   R      04/08/23 1355            Total time spent in review of labs and imaging, patient evaluation, formulation of plan, documentation and communication with family: 45 minutes  Signed, Lorin Glass, MD [facility] 04/09/2023

## 2023-04-09 NOTE — Progress Notes (Signed)
Heart Failure Navigator Progress Note  Assessed for Heart & Vascular TOC clinic readiness.  Patient does not meet criteria due to having follow up with [facility] on 04/29/23.   Navigator will sign off at this time.    Camarie Mctigue,RN, BSN,MSN Heart Failure Nurse Navigator. Contact by secure chat only.

## 2023-04-09 NOTE — Progress Notes (Addendum)
Rounding Note    Patient Name: Matthew Chen Date of Encounter: 04/09/2023  [facility] Cardiologist: Nanetta Batty, MD   Subjective   Pt unaware of his Afib RVR  Inpatient Medications    Scheduled Meds:  amiodarone  200 mg Oral QHS   aspirin EC  81 mg Oral Daily   carvedilol  6.25 mg Oral BID WC   cyanocobalamin  1,000 mcg Oral Daily   empagliflozin  10 mg Oral Daily   fenofibrate  160 mg Oral Daily   ferrous sulfate  325 mg Oral Daily   fesoterodine  4 mg Oral Daily   furosemide  80 mg Intravenous BID   linagliptin  5 mg Oral Daily   omega-3 acid ethyl esters  1 g Oral BID WC   pantoprazole  40 mg Oral BID   pioglitazone  30 mg Oral Daily   potassium chloride  20 mEq Oral Daily   Continuous Infusions:  PRN Meds: acetaminophen **OR** acetaminophen, prochlorperazine   Vital Signs    Vitals:   04/08/23 1606 04/08/23 2131 04/09/23 0500 04/09/23 0557  BP: 116/71 101/70  112/70  Pulse: 72   75  Resp:  20  20  Temp: 98.2 F (36.8 C) 98.6 F (37 C)  98.3 F (36.8 C)  TempSrc: Oral Oral  Oral  SpO2: 95% 97%  96%  Weight:   92.6 kg   Height:        Intake/Output Summary (Last 24 hours) at 04/09/2023 1055 Last data filed at 04/09/2023 0500 Gross per 24 hour  Intake 410 ml  Output 5050 ml  Net -4640 ml      04/09/2023    5:00 AM 04/08/2023    6:19 AM 04/07/2023    1:20 PM  Last 3 Weights  Weight (lbs) 204 lb 3.2 oz 210 lb 1.6 oz 218 lb 8 oz  Weight (kg) 92.625 kg 95.3 kg 99.111 kg      Telemetry    Afib RVR in the 120s - Personally Reviewed  ECG    Na - Personally Reviewed  Physical Exam   GEN: No acute distress.   Neck: No JVD Cardiac: iRRR, systolic  murmur  Respiratory: Clear to auscultation bilaterally. GI: less distended than yesterday  MS: B LE L>R edema; No deformity. Neuro:  Nonfocal  Psych: Normal affect   Labs    High Sensitivity Troponin:  No results for input(s): "TROPONINIHS" in the last 720 hours.    Chemistry Recent Labs  Lab 04/07/23 0943 04/08/23 0410 04/09/23 0415  NA 134* 132* 140  K 4.5 4.0 3.4*  CL 101 101 102  CO2 25 23 25   GLUCOSE 153* 109* 101*  BUN 20 25* 31*  CREATININE 1.42* 1.52* 1.42*  CALCIUM 8.1* 7.9* 8.3*  PROT 5.8* 5.8* 5.8*  ALBUMIN 2.6* 1.8* 1.7*  AST 144* 107* 69*  ALT 27 26 25   ALKPHOS 207* 187* 185*  BILITOT 3.9* 3.8* 3.6*  GFRNONAA 54* 50* 54*  ANIONGAP 8 8 13     Lipids No results for input(s): "CHOL", "TRIG", "HDL", "LABVLDL", "LDLCALC", "CHOLHDL" in the last 168 hours.  Hematology Recent Labs  Lab 04/07/23 0943 04/08/23 0410 04/09/23 0415  WBC 11.8* 10.8* 10.1  RBC 3.74* 3.23* 3.43*  HGB 10.4* 9.1* 9.5*  HCT 33.5* 29.0* 30.2*  MCV 89.6 89.8 88.0  MCH 27.8 28.2 27.7  MCHC 31.0 31.4 31.5  RDW 19.9* 19.9* 19.9*  PLT 345 300 301   Thyroid No results for input(s): "  TSH", "FREET4" in the last 168 hours.  BNP Recent Labs  Lab 04/07/23 0943  BNP 1,374.1*    DDimer No results for input(s): "DDIMER" in the last 168 hours.   Radiology    DG Chest Port 1 View  Result Date: 04/07/2023 CLINICAL DATA:  Acute dyspnea. EXAM: PORTABLE CHEST 1 VIEW COMPARISON:  Chest radiographs 10/05/2022 and 03/27/2022 FINDINGS: Single frontal view of the chest. Cardiac silhouette is at the upper limits of normal size, unchanged. Mediastinal contours are within normal limits. The lungs are clear. No pleural effusion pneumothorax. No acute skeletal abnormality. IMPRESSION: No acute cardiopulmonary process. Electronically Signed   By: Neita Garnet M.D.   On: 04/07/2023 15:56    Cardiac Studies   Echo 03/25/23:  1. Left ventricular ejection fraction, by estimation, is 45 to 50%. The  left ventricle has mildly decreased function. The left ventricle  demonstrates global hypokinesis. Left ventricular diastolic parameters are  indeterminate.   2. Right ventricular systolic function is normal. The right ventricular  size is normal. There is mildly elevated  pulmonary artery systolic  pressure. The estimated right ventricular systolic pressure is 39.5 mmHg.   3. Left atrial size was severely dilated.   4. Right atrial size was mildly dilated.   5. The mitral valve is normal in structure. Mild to moderate mitral valve  regurgitation. No evidence of mitral stenosis.   6. The aortic valve is bicuspid. There is moderate calcification of the  aortic valve. There is moderate thickening of the aortic valve. Aortic  valve regurgitation is not visualized. Aortic valve sclerosis is present,  with no evidence of aortic valve  stenosis.   7. Aortic dilatation noted. There is mild dilatation of the aortic root,  measuring 43 mm. There is mild dilatation of the ascending aorta,  measuring 41 mm.   8. The inferior vena cava is normal in size with greater than 50%  respiratory variability, suggesting right atrial pressure of 3 mmHg.   Comparison(s): Prior images reviewed side by side. The left ventricular  function is worsened. Prior EF 65%.   Patient Profile     67 y.o. male with a hx of persistent atrial fibrillation, chronic diastolic heart failure, thoracic aortic aneurysm, bicuspid aortic valve, CKD stage III, CAD, hypertension, hyperlipidemia, DM2, PAD, hx of tobacco abuse, and hepatocellular carcinoma who is being seen for the evaluation of CHF exacerbation.  Assessment & Plan    Acute on chronic systolic and diastolic heart failure Mild to moderate MR Hepatocellular carcinoma  Hypoalbuminemia Elevated LFTs Acute on chronic kidney disease III, hematuria - recent echo with LVEF 45-50% with prior echo consistent with grade 2 DD - BNP 1374 - LE and abdominal distension likely more related to liver function - no spironolactone given renal function - baseline sCr 1.0-1.3, sCr now stable at 1.42 - increased diuresis to 80 mg IV lasix with excellent results of 5 L urine output, weight down to 204 from 218 lbs - continue this diuresis today  especially since RVR   Persistent Afib - amiodarone 200 mg daily with coreg and PRN cardizem - QTC was 487 ms, failed tikosyn previously - unfortunately converted to Afib RVR overnight with rates now in the 100-120s - switched coreg to lopressor for more pressure room to rate control - will start IV amiodarone - could consider digoxin if needed  Bicuspid AV/Calcified - stable, no significant AS   CAD - known D1 stenosis, intervention c/b coronary perforation - treated medically - no current  chest pain     PAD - multiple prior interventions    For questions or updates, please contact [facility] Please consult [email or web address] for contact info under        Signed, Marcelino Duster, PA  04/09/2023, 10:55 AM

## 2023-04-09 NOTE — TOC CM/SW Note (Signed)
Transition of Care [facility]) - Inpatient Brief Assessment   Patient Details  Name: Matthew Chen MRN: [id number] Date of Birth: 08-21-1955  Transition of Care [facility]) CM/SW Contact:    Larrie Kass, LCSW Phone Number: 04/09/2023, 9:24 AM   Clinical Narrative:  Transition of Care Department [facility]) has reviewed patient and no TOC needs have been identified at this time. We will continue to monitor patient advancement through interdisciplinary progression rounds. If new patient transition needs arise, please place a TOC consult.  Transition of Care Asessment: Insurance and Status: Insurance coverage has been reviewed Patient has primary care physician: Yes Home environment has been reviewed: home with wife Prior level of function:: independent Prior/Current Home Services: No current home services Social Determinants of Health Reivew: SDOH reviewed no interventions necessary Readmission risk has been reviewed: Yes Transition of care needs: no transition of care needs at this time

## 2023-04-10 DIAGNOSIS — N183 Chronic kidney disease, stage 3 unspecified: Secondary | ICD-10-CM | POA: Diagnosis not present

## 2023-04-10 DIAGNOSIS — I5023 Acute on chronic systolic (congestive) heart failure: Secondary | ICD-10-CM | POA: Diagnosis not present

## 2023-04-10 DIAGNOSIS — I4819 Other persistent atrial fibrillation: Secondary | ICD-10-CM | POA: Diagnosis not present

## 2023-04-10 DIAGNOSIS — N179 Acute kidney failure, unspecified: Secondary | ICD-10-CM | POA: Diagnosis not present

## 2023-04-10 DIAGNOSIS — I5043 Acute on chronic combined systolic (congestive) and diastolic (congestive) heart failure: Secondary | ICD-10-CM | POA: Diagnosis not present

## 2023-04-10 LAB — COMPREHENSIVE METABOLIC PANEL
ALT: 23 U/L (ref 0–44)
AST: 61 U/L — ABNORMAL HIGH (ref 15–41)
Albumin: 1.8 g/dL — ABNORMAL LOW (ref 3.5–5.0)
Alkaline Phosphatase: 197 U/L — ABNORMAL HIGH (ref 38–126)
Anion gap: 12 (ref 5–15)
BUN: 39 mg/dL — ABNORMAL HIGH (ref 8–23)
CO2: 25 mmol/L (ref 22–32)
Calcium: 8 mg/dL — ABNORMAL LOW (ref 8.9–10.3)
Chloride: 100 mmol/L (ref 98–111)
Creatinine, Ser: 1.34 mg/dL — ABNORMAL HIGH (ref 0.61–1.24)
GFR, Estimated: 58 mL/min — ABNORMAL LOW (ref >60)
Glucose, Bld: 107 mg/dL — ABNORMAL HIGH (ref 70–99)
Potassium: 3.2 mmol/L — ABNORMAL LOW (ref 3.5–5.1)
Sodium: 137 mmol/L (ref 135–145)
Total Bilirubin: 3 mg/dL — ABNORMAL HIGH (ref 0.3–1.2)
Total Protein: 5.9 g/dL — ABNORMAL LOW (ref 6.5–8.1)

## 2023-04-10 LAB — CBC WITH DIFFERENTIAL/PLATELET
Abs Immature Granulocytes: 0.08 10*3/uL — ABNORMAL HIGH (ref 0.00–0.07)
Basophils Absolute: 0 10*3/uL (ref 0.0–0.1)
Basophils Relative: 0 %
Eosinophils Absolute: 0.2 10*3/uL (ref 0.0–0.5)
Eosinophils Relative: 2 %
HCT: 33.2 % — ABNORMAL LOW (ref 39.0–52.0)
Hemoglobin: 10.3 g/dL — ABNORMAL LOW (ref 13.0–17.0)
Immature Granulocytes: 1 %
Lymphocytes Relative: 5 %
Lymphs Abs: 0.5 10*3/uL — ABNORMAL LOW (ref 0.7–4.0)
MCH: 27.5 pg (ref 26.0–34.0)
MCHC: 31 g/dL (ref 30.0–36.0)
MCV: 88.8 fL (ref 80.0–100.0)
Monocytes Absolute: 0.9 10*3/uL (ref 0.1–1.0)
Monocytes Relative: 9 %
Neutro Abs: 7.8 10*3/uL — ABNORMAL HIGH (ref 1.7–7.7)
Neutrophils Relative %: 83 %
Platelets: 318 10*3/uL (ref 150–400)
RBC: 3.74 MIL/uL — ABNORMAL LOW (ref 4.22–5.81)
RDW: 20 % — ABNORMAL HIGH (ref 11.5–15.5)
WBC: 9.5 10*3/uL (ref 4.0–10.5)
nRBC: 0.3 % — ABNORMAL HIGH (ref 0.0–0.2)

## 2023-04-10 MED ORDER — ORAL CARE MOUTH RINSE: 15.0000 mL | OROMUCOSAL | Status: DC | PRN

## 2023-04-10 MED ORDER — METOPROLOL TARTRATE 50 MG PO TABS
50.0000 mg | ORAL_TABLET | Freq: Two times a day (BID) | ORAL | Status: DC
  Administered 2023-04-10 – 2023-04-11 (×2): 50 mg via ORAL
  Filled 2023-04-10 (×2): qty 1

## 2023-04-10 MED ORDER — POTASSIUM CHLORIDE CRYS ER 20 MEQ PO TBCR
40.0000 meq | EXTENDED_RELEASE_TABLET | Freq: Every day | ORAL | Status: DC
  Administered 2023-04-10 – 2023-04-11 (×2): 40 meq via ORAL
  Filled 2023-04-10 (×2): qty 2

## 2023-04-10 MED ORDER — AMIODARONE HCL 200 MG PO TABS
200.0000 mg | ORAL_TABLET | Freq: Every day | ORAL | Status: DC
  Administered 2023-04-10 – 2023-04-11 (×2): 200 mg via ORAL
  Filled 2023-04-10 (×2): qty 1

## 2023-04-10 NOTE — Progress Notes (Signed)
Rounding Note    Patient Name: Matthew Chen Date of Encounter: 04/10/2023  [facility] Cardiologist: Nanetta Batty, MD   Subjective   He's been able to walk around without SOB. Feels better  Interval Hx: Rates are better controlled Went into afib 8/28, switched oral amio to IV and change coreg to metop to cycle -diuresing well  Inpatient Medications    Scheduled Meds:  aspirin EC  81 mg Oral Daily   cyanocobalamin  1,000 mcg Oral Daily   empagliflozin  10 mg Oral Daily   feeding supplement  237 mL Oral BID BM   fenofibrate  160 mg Oral Daily   ferrous sulfate  325 mg Oral Daily   fesoterodine  4 mg Oral Daily   furosemide  80 mg Intravenous BID   linagliptin  5 mg Oral Daily   metoprolol tartrate  25 mg Oral Q6H   omega-3 acid ethyl esters  1 g Oral BID WC   pantoprazole  40 mg Oral BID   pioglitazone  30 mg Oral Daily   potassium chloride  40 mEq Oral Daily   Continuous Infusions:  amiodarone 30 mg/hr (04/09/23 2317)   PRN Meds: acetaminophen **OR** acetaminophen, mouth rinse, prochlorperazine   Vital Signs    Vitals:   04/09/23 2230 04/10/23 0638 04/10/23 0702 04/10/23 0835  BP: 108/82 116/80  (!) 119/91  Pulse: (!) 106 (!) 105  (!) 110  Resp: 20 18    Temp: 98.1 F (36.7 C) 98 F (36.7 C)  97.8 F (36.6 C)  TempSrc: Oral Oral  Oral  SpO2: 99% 98%  99%  Weight:   91 kg   Height:        Intake/Output Summary (Last 24 hours) at 04/10/2023 0945 Last data filed at 04/10/2023 0900 Gross per 24 hour  Intake 943.14 ml  Output 5000 ml  Net -4056.86 ml      04/10/2023    7:02 AM 04/09/2023    5:00 AM 04/08/2023    6:19 AM  Last 3 Weights  Weight (lbs) 200 lb 9.9 oz 204 lb 3.2 oz 210 lb 1.6 oz  Weight (kg) 91 kg 92.625 kg 95.3 kg      Telemetry    Afib rates in the 1 teens - Personally Reviewed  ECG    Na - Personally Reviewed  Physical Exam   GEN: No acute distress.   Neck: No JVD Cardiac: iRRR, systolic  murmur  Respiratory:  Clear to auscultation bilaterally. GI: less distended   MS:  LE edema BL pitting; improved. Neuro:  Nonfocal  Psych: Normal affect   Labs    High Sensitivity Troponin:  No results for input(s): "TROPONINIHS" in the last 720 hours.   Chemistry Recent Labs  Lab 04/08/23 0410 04/09/23 0415 04/10/23 0419  NA 132* 140 137  K 4.0 3.4* 3.2*  CL 101 102 100  CO2 23 25 25   GLUCOSE 109* 101* 107*  BUN 25* 31* 39*  CREATININE 1.52* 1.42* 1.34*  CALCIUM 7.9* 8.3* 8.0*  PROT 5.8* 5.8* 5.9*  ALBUMIN 1.8* 1.7* 1.8*  AST 107* 69* 61*  ALT 26 25 23   ALKPHOS 187* 185* 197*  BILITOT 3.8* 3.6* 3.0*  GFRNONAA 50* 54* 58*  ANIONGAP 8 13 12     Lipids No results for input(s): "CHOL", "TRIG", "HDL", "LABVLDL", "LDLCALC", "CHOLHDL" in the last 168 hours.  Hematology Recent Labs  Lab 04/08/23 0410 04/09/23 0415 04/10/23 0419  WBC 10.8* 10.1 9.5  RBC 3.23*  3.43* 3.74*  HGB 9.1* 9.5* 10.3*  HCT 29.0* 30.2* 33.2*  MCV 89.8 88.0 88.8  MCH 28.2 27.7 27.5  MCHC 31.4 31.5 31.0  RDW 19.9* 19.9* 20.0*  PLT 300 301 318   Thyroid No results for input(s): "TSH", "FREET4" in the last 168 hours.  BNP Recent Labs  Lab 04/07/23 0943  BNP 1,374.1*    DDimer No results for input(s): "DDIMER" in the last 168 hours.   Radiology    No results found.  Cardiac Studies   Echo 03/25/23:  1. Left ventricular ejection fraction, by estimation, is 45 to 50%. The  left ventricle has mildly decreased function. The left ventricle  demonstrates global hypokinesis. Left ventricular diastolic parameters are  indeterminate.   2. Right ventricular systolic function is normal. The right ventricular  size is normal. There is mildly elevated pulmonary artery systolic  pressure. The estimated right ventricular systolic pressure is 39.5 mmHg.   3. Left atrial size was severely dilated.   4. Right atrial size was mildly dilated.   5. The mitral valve is normal in structure. Mild to moderate mitral valve   regurgitation. No evidence of mitral stenosis.   6. The aortic valve is bicuspid. There is moderate calcification of the  aortic valve. There is moderate thickening of the aortic valve. Aortic  valve regurgitation is not visualized. Aortic valve sclerosis is present,  with no evidence of aortic valve  stenosis.   7. Aortic dilatation noted. There is mild dilatation of the aortic root,  measuring 43 mm. There is mild dilatation of the ascending aorta,  measuring 41 mm.   8. The inferior vena cava is normal in size with greater than 50%  respiratory variability, suggesting right atrial pressure of 3 mmHg.   Comparison(s): Prior images reviewed side by side. The left ventricular  function is worsened. Prior EF 65%.   Patient Profile     67 y.o. male with a hx of persistent atrial fibrillation, chronic diastolic heart failure, thoracic aortic aneurysm, bicuspid aortic valve, CKD stage III, CAD, hypertension, hyperlipidemia, DM2, PAD, hx of tobacco abuse, and hepatocellular carcinoma who is being seen for the evaluation of CHF exacerbation.  Assessment & Plan    Acute on chronic systolic and diastolic heart failure Mild to moderate MR Hepatocellular carcinoma  Hypoalbuminemia Elevated LFTs Acute on chronic kidney disease III, hematuria - recent echo with LVEF 45-50% with prior echo consistent with grade 2 DD - BNP 1374 - LE and abdominal distension likely more related to liver function - no spironolactone given renal function - baseline sCr 1.0-1.3, stable - out net negative 9L total - down to 200 from 218 lbs - continue this IV diuresis today    Persistent Afib - at home on amiodarone 200 mg daily with coreg and PRN cardizem - QTC was 487 ms, failed tikosyn previously - unfortunately converted to Afib RVR overnight with rates now in the 100-120s - switched coreg to lopressor for more pressure room to rate control 8/28 - started IV amiodarone 8/28 -rates are better controlled  will go back to oral amiodarone -continue home eliquis   Bicuspid AV/Calcified - stable, no significant AS  CAD - known D1 stenosis, intervention c/b coronary perforation - treated medically - no current chest pain     PAD - multiple prior interventions   Can do another day of IV lasix. Clinically he has improved. LE edema will take time with low albumin. Recommend lasix 40 mg BID starting tomorrow, I  think he can go home tomorrow.  For questions or updates, please contact [facility] Please consult [email or web address] for contact info under        Signed, Maisie Fus, MD  04/10/2023, 9:45 AM

## 2023-04-10 NOTE — Progress Notes (Signed)
PROGRESS NOTE  Matthew Chen  DOB: 01-Aug-1956  PCP: Donita Brooks, MD ZOX:[id number]  DOA: 04/07/2023  LOS: 3 days  Hospital Day: 4  Brief narrative: Matthew Chen is a 67 y.o. male with PMH significant for HCC, obesity, DM2, HTN, HLD, paroxysmal A-fib not on anticoagulation, CAD, chronic systolic CHF, PAD, bicuspid aortic valve, thoracic aortic aneurysm, CKD, ulcerative colitis. Patient follows up with oncology Dr. Truett Perna for [facility].   8/9, he underwent Y90 (radioembolization) of left lobe of liver by IR  8/26, patient had a scheduled routine follow-up at cancer center.  He reported progressive shortness of breath, pedal edema, abdominal distention and generalized weakness for 1 week.  Patient reported that for the past 1 week, he has not been taking Lasix and also been drinking a lot of water.  Initial vital signs unremarkable Labs showed WBC count 11.8, hemoglobin 10.4, BNP over 1300, sodium 134, BUN/creatinine 20/1.42, LFTs elevated with total bilirubin 3.9 He was hospitalized for further evaluation and management.  Subjective: Patient was seen and examined this morning. Lying on bed.  Not in distress.  No new symptoms.  Remains on IV amiodarone drip.  Still in A-fib.  Diuresing well on IV Lasix.  Wife not at bedside today.  Assessment and plan: Acute on chronic systolic CHF Essential hypertension Presented with symptoms of volume overload status (shortness of breath, pedal edema, abdominal distention), reports noncompliance to diuretics.   Most recent echo from 8/13 with EF 45 to 50%, global hypokinesis, mild to moderate mitral regurg, severely dilated left atrium, mildly elevated pulmonary artery systolic pressure. He has been started on Lasix 80 mg IV twice daily.  More than 10 L of output since admission. Also on metoprolol Net IO Since Admission: -10,531.86 mL [04/10/23 1250] Continue to monitor for daily intake output, weight, blood pressure, BNP, renal function and  electrolytes. Recent Labs  Lab 04/07/23 0943 04/08/23 0410 04/09/23 0415 04/10/23 0419  BNP 1,374.1*  --   --   --   BUN 20 25* 31* 39*  CREATININE 1.42* 1.52* 1.42* 1.34*  NA 134* 132* 140 137  K 4.5 4.0 3.4* 3.2*   A-fib with RVR  H/o paroxysmal A-fib Patient is in A-fib with RVR since last night.   PTA meds-carvedilol 6.25 mg p.o. twice daily, amiodarone 200 mg daily Cardizem 30 mg 3 times daily as needed Cardiologist switched from carvedilol to metoprolol 50 mg twice daily.  Started IV amiodarone drip.  Continue to monitor daily.  If no improvement, may need digoxin. Was previously anticoagulated with Eliquis.  Reports he has been taken off of it for at least 2 months now.  CKD stage 2 Creatinine was normal at 0.94 in July.  Currently with creatinine elevated to 1.52 Monitor on diuresis  Acute hyponatremia Likely combination of fluid retention, low solute intake Improving.  Hepatocellular carcinoma Elevated LFTs New hyperbilirubinemia Patient follows up with oncology Dr. Truett Perna for [facility].   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  Likely secondary to [facility] as well as amiodarone. Last seen by cardiologist Dr. Elberta Fortis on 8/22.  He had a plan to recheck LFTs in 3 months. Labs on admission shows significant rise in AST and bilirubin level.  Oncology consult appreciated.  Rising LFT and bilirubin likely due to radioembolization last week.  Gradually improving Albumin low, INR 1.5. seen by oncology Dr. Truett Perna Recent Labs  Lab 04/07/23 [phone] 04/08/23 0410 04/09/23 1478  04/10/23 0419  AST 144* 107* 69* 61*  ALT 27 26 25 23   ALKPHOS 207* 187* 185* 197*  BILITOT 3.9* 3.8* 3.6* 3.0*  PROT 5.8* 5.8* 5.8* 5.9*  ALBUMIN 2.6* 1.8* 1.7* 1.8*  INR  --  1.5* 1.4*  --   PLT 345 300 301 318   Hypoalbuminemia Malnutrition Albumin level <2.  ID consult appreciated.  Started on supplements.  Type 2 diabetes  mellitus A1c 7 on March 2024 PTA meds-Jardiance 10 mg daily, Actos 30 mg daily, Januvia 100 mg daily Continue all for now. Recent Labs  Lab 04/07/23 1741 04/07/23 2051 04/08/23 0748 04/08/23 1155  GLUCAP 110* 137* 98 134*   CAD/PAD/HLD Continue continue aspirin and beta-blocker. Statin held due to worsening of the liver function. Continue fenofibrate  Mild hematuria 8/27, patient reported drops of blood after urination today.  His wife showed me a picture of it.  Had not noticed that at home Urinalysis at presentation showed large amount of hemoglobin. Most recent CT pelvis from 5/24 did not show any remarkable findings in the visualized ureter and urinary bladder. Patient states that hematuria has stopped.  Continue to monitor.  If recurs, will get urology consultation.  Chronic anemia Continue PPI, iron supplement,  Recent Labs    02/11/23 1357 02/11/23 1358 03/06/23 0846 03/21/23 0820 04/07/23 0943 04/08/23 0410 04/09/23 0415 04/10/23 0419  HGB 8.2*  --    < > 10.6* 10.4* 9.1* 9.5* 10.3*  MCV 96.3  --    < > 92.5 89.6 89.8 88.0 88.8  FERRITIN  --  243  --   --   --   --   --   --   RETICCTPCT 3.4*  --   --   --   --   --   --   --    < > = values in this interval not displayed.   Mobility: PT eval obtained.  No follow-up recommended.  Goals of care   Code Status: Full Code     DVT prophylaxis: SCDs Place and maintain sequential compression device Start: 04/08/23 1350   Antimicrobials: None Fluid: None Consultants: Cardiology Family Communication: Wife at bedside  Status: Inpatient Level of care:  Telemetry   Patient is from: Home Needs to continue in-hospital care: IV diuresis to continue.  Currently on IV amiodarone. Anticipated d/c to: Home in 2 to 3 days   Diet:  Diet Order             Diet heart healthy/carb modified Room service appropriate? Yes; Fluid consistency: Thin; Fluid restriction: 1200 mL Fluid  Diet effective now                    Scheduled Meds:  amiodarone  200 mg Oral Daily   aspirin EC  81 mg Oral Daily   cyanocobalamin  1,000 mcg Oral Daily   empagliflozin  10 mg Oral Daily   feeding supplement  237 mL Oral BID BM   fenofibrate  160 mg Oral Daily   ferrous sulfate  325 mg Oral Daily   fesoterodine  4 mg Oral Daily   furosemide  80 mg Intravenous BID   linagliptin  5 mg Oral Daily   metoprolol tartrate  50 mg Oral BID   omega-3 acid ethyl esters  1 g Oral BID WC   pantoprazole  40 mg Oral BID   pioglitazone  30 mg Oral Daily   potassium chloride  40 mEq Oral Daily  PRN meds: acetaminophen **OR** acetaminophen, mouth rinse, prochlorperazine   Infusions:     Antimicrobials: Anti-infectives (From admission, onward)    None       Nutritional status:  Body mass index is 28.79 kg/m.  Nutrition Problem: Increased nutrient needs Etiology: cancer and cancer related treatments Signs/Symptoms: estimated needs     Objective: Vitals:   04/10/23 0638 04/10/23 0835  BP: 116/80 (!) 119/91  Pulse: (!) 105 (!) 110  Resp: 18   Temp: 98 F (36.7 C) 97.8 F (36.6 C)  SpO2: 98% 99%    Intake/Output Summary (Last 24 hours) at 04/10/2023 1250 Last data filed at 04/10/2023 1200 Gross per 24 hour  Intake 1423.14 ml  Output 6175 ml  Net -4751.86 ml   Filed Weights   04/08/23 0619 04/09/23 0500 04/10/23 0702  Weight: 95.3 kg 92.6 kg 91 kg   Weight change:  Body mass index is 28.79 kg/m.   Physical Exam: General exam: Pleasant, elderly Caucasian male.  No new symptoms Skin: No rashes, lesions or ulcers. HEENT: Atraumatic, normocephalic, no obvious bleeding Lungs: Clear to auscultation bilaterally CVS: Irregular fast rhythm, no murmur GI/Abd soft, nontender, mild distention present, bowel sound present CNS: Alert, awake, oriented x 3 Psychiatry: Mood appropriate Extremities: Improving bilateral pedal edema, no calf tenderness  Data Review: I have personally reviewed the  laboratory data and studies available.  F/u labs ordered Unresulted Labs (From admission, onward)     Start     Ordered   04/09/23 0500  CBC with Differential/Platelet  Daily,   R      04/08/23 1355   04/09/23 0500  Comprehensive metabolic panel  Daily,   R      04/08/23 1355            Total time spent in review of labs and imaging, patient evaluation, formulation of plan, documentation and communication with family: 45 minutes  Signed, Lorin Glass, MD [facility] 04/10/2023

## 2023-04-11 ENCOUNTER — Other Ambulatory Visit: Payer: Self-pay | Admitting: *Deleted

## 2023-04-11 DIAGNOSIS — I5043 Acute on chronic combined systolic (congestive) and diastolic (congestive) heart failure: Secondary | ICD-10-CM | POA: Diagnosis not present

## 2023-04-11 DIAGNOSIS — I4819 Other persistent atrial fibrillation: Secondary | ICD-10-CM | POA: Diagnosis not present

## 2023-04-11 DIAGNOSIS — N179 Acute kidney failure, unspecified: Secondary | ICD-10-CM | POA: Diagnosis not present

## 2023-04-11 DIAGNOSIS — C220 Liver cell carcinoma: Secondary | ICD-10-CM

## 2023-04-11 DIAGNOSIS — I5023 Acute on chronic systolic (congestive) heart failure: Secondary | ICD-10-CM | POA: Diagnosis not present

## 2023-04-11 DIAGNOSIS — N183 Chronic kidney disease, stage 3 unspecified: Secondary | ICD-10-CM | POA: Diagnosis not present

## 2023-04-11 LAB — CBC WITH DIFFERENTIAL/PLATELET
Abs Immature Granulocytes: 0.13 10*3/uL — ABNORMAL HIGH (ref 0.00–0.07)
Basophils Absolute: 0 10*3/uL (ref 0.0–0.1)
Basophils Relative: 0 %
Eosinophils Absolute: 0.2 10*3/uL (ref 0.0–0.5)
Eosinophils Relative: 2 %
HCT: 34.2 % — ABNORMAL LOW (ref 39.0–52.0)
Hemoglobin: 10.9 g/dL — ABNORMAL LOW (ref 13.0–17.0)
Immature Granulocytes: 1 %
Lymphocytes Relative: 6 %
Lymphs Abs: 0.8 10*3/uL (ref 0.7–4.0)
MCH: 28 pg (ref 26.0–34.0)
MCHC: 31.9 g/dL (ref 30.0–36.0)
MCV: 87.9 fL (ref 80.0–100.0)
Monocytes Absolute: 1.1 10*3/uL — ABNORMAL HIGH (ref 0.1–1.0)
Monocytes Relative: 9 %
Neutro Abs: 9.9 10*3/uL — ABNORMAL HIGH (ref 1.7–7.7)
Neutrophils Relative %: 82 %
Platelets: 441 10*3/uL — ABNORMAL HIGH (ref 150–400)
RBC: 3.89 MIL/uL — ABNORMAL LOW (ref 4.22–5.81)
RDW: 20.3 % — ABNORMAL HIGH (ref 11.5–15.5)
WBC: 12.3 10*3/uL — ABNORMAL HIGH (ref 4.0–10.5)
nRBC: 0.5 % — ABNORMAL HIGH (ref 0.0–0.2)

## 2023-04-11 LAB — COMPREHENSIVE METABOLIC PANEL
ALT: 26 U/L (ref 0–44)
AST: 66 U/L — ABNORMAL HIGH (ref 15–41)
Albumin: 1.9 g/dL — ABNORMAL LOW (ref 3.5–5.0)
Alkaline Phosphatase: 219 U/L — ABNORMAL HIGH (ref 38–126)
Anion gap: 11 (ref 5–15)
BUN: 42 mg/dL — ABNORMAL HIGH (ref 8–23)
CO2: 30 mmol/L (ref 22–32)
Calcium: 8.5 mg/dL — ABNORMAL LOW (ref 8.9–10.3)
Chloride: 99 mmol/L (ref 98–111)
Creatinine, Ser: 1.47 mg/dL — ABNORMAL HIGH (ref 0.61–1.24)
GFR, Estimated: 52 mL/min — ABNORMAL LOW (ref >60)
Glucose, Bld: 111 mg/dL — ABNORMAL HIGH (ref 70–99)
Potassium: 3.8 mmol/L (ref 3.5–5.1)
Sodium: 140 mmol/L (ref 135–145)
Total Bilirubin: 2.9 mg/dL — ABNORMAL HIGH (ref 0.3–1.2)
Total Protein: 6.6 g/dL (ref 6.5–8.1)

## 2023-04-11 MED ORDER — FUROSEMIDE 40 MG PO TABS: 40.0000 mg | ORAL_TABLET | Freq: Two times a day (BID) | ORAL | 0 refills | Status: DC

## 2023-04-11 MED ORDER — POTASSIUM CHLORIDE CRYS ER 20 MEQ PO TBCR: 40.0000 meq | EXTENDED_RELEASE_TABLET | Freq: Every day | ORAL | 0 refills | Status: DC

## 2023-04-11 MED ORDER — FUROSEMIDE 40 MG PO TABS
40.0000 mg | ORAL_TABLET | Freq: Two times a day (BID) | ORAL | Status: DC
  Administered 2023-04-11: 40 mg via ORAL
  Filled 2023-04-11: qty 1

## 2023-04-11 MED ORDER — METOPROLOL TARTRATE 50 MG PO TABS: 50.0000 mg | ORAL_TABLET | Freq: Two times a day (BID) | ORAL | 0 refills | Status: DC

## 2023-04-11 MED ORDER — ENSURE ENLIVE PO LIQD: 237.0000 mL | Freq: Two times a day (BID) | ORAL | 12 refills | Status: DC

## 2023-04-11 NOTE — Progress Notes (Signed)
Per Dr. Truett Perna: Hospital D/C today. Needs lab/OV in 3 weeks. Scheduling message sent.

## 2023-04-11 NOTE — Discharge Summary (Signed)
Physician Discharge Summary  Matthew Chen:[id number] DOB: 08-12-1956 DOA: 04/07/2023  PCP: Donita Brooks, MD  Admit date: 04/07/2023 Discharge date: 04/11/2023  Admitted From: Home Discharge disposition: Home  Recommendations at discharge:  Acute CHF/hypertension meds have been adjusted to metoprolol twice a day, Lasix twice a day Continue potassium supplement with Lasix. Continue amiodarone as before For diabetes, continue Jardiance and Januvia.  Stop Actos   Brief narrative: Matthew Chen is a 67 y.o. male with PMH significant for HCC, obesity, DM2, HTN, HLD, paroxysmal A-fib not on anticoagulation, CAD, chronic systolic CHF, PAD, bicuspid aortic valve, thoracic aortic aneurysm, CKD, ulcerative colitis. Patient follows up with oncology Dr. Truett Perna for [facility].   8/9, he underwent Y90 (radioembolization) of left lobe of liver by IR  8/26, patient had a scheduled routine follow-up at cancer center.  He reported progressive shortness of breath, pedal edema, abdominal distention and generalized weakness for 1 week.  Patient reported that for the past 1 week, he has not been taking Lasix and also been drinking a lot of water.  Initial vital signs unremarkable Labs showed WBC count 11.8, hemoglobin 10.4, BNP over 1300, sodium 134, BUN/creatinine 20/1.42, LFTs elevated with total bilirubin 3.9 He was hospitalized for further evaluation and management.  Subjective: Patient was seen and examined this morning. Lying on bed.  Wife at bedside. Feels better.  Converted to normal sinus rhythm.  Diuresing well.   Feels ready to go  Hospital course: Acute on chronic systolic CHF Essential hypertension Presented with symptoms of volume overload status (shortness of breath, pedal edema, abdominal distention), reports noncompliance to diuretics.   Most recent echo from 8/13 with EF 45 to 50%, global hypokinesis, mild to moderate mitral regurg, severely dilated left atrium, mildly elevated  pulmonary artery systolic pressure. Cardiology consult was obtained Patient was aggressively diuresed with IV Lasix.  More than 10 L of negative balance since admission.  Weight down from 218 pounds to 196 pound. Will discharge patient on Lasix 40 mg twice daily and metoprolol tartrate 50 mg twice daily. Follow-up with cardiology as an outpatient.  A-fib with RVR  H/o paroxysmal A-fib Patient was in A-fib with RVR on 8/28.  He was started on IV amiodarone drip.  Carvedilol was switched to metoprolol. Converted to normal sinus rhythm. Per cardiology recommendation, will discharge patient on amiodarone 200 mg daily, metoprolol 50 mg twice daily.  Continue Cardizem as needed as before. Patient reports he was previously anticoagulated with Eliquis.  But was taken off of it 2 months prior. Continue aspirin as before.  AKI on CKD stage 2 Creatinine was normal at 0.94 in July.  Presented with with creatinine elevated to 1.52.  Stable on dialysis.  Probably new baseline. Monitor on diuresis Recent Labs    01/22/23 1309 02/11/23 1357 03/06/23 0846 03/07/23 0819 03/21/23 0820 04/07/23 0943 04/08/23 0410 04/09/23 0415 04/10/23 0419 04/11/23 0413  BUN 20 22 19 18 22 20  25* 31* 39* 42*  CREATININE 1.23 1.36* 1.04 0.95 1.38* 1.42* 1.52* 1.42* 1.34* 1.47*   Hypokalemia Potassium level improved with replacement.  He has been started on KCl 40 mg twice daily. Recent Labs  Lab 04/07/23 0943 04/08/23 0410 04/09/23 0415 04/10/23 0419 04/11/23 0413  K 4.5 4.0 3.4* 3.2* 3.8   Acute hyponatremia Likely combination of fluid retention, low solute intake Improved Recent Labs  Lab 04/07/23 0943 04/08/23 0410 04/09/23 0415 04/10/23 0419 04/11/23 0413  NA 134* 132* 140 137 140  Hepatocellular carcinoma Elevated LFTs New hyperbilirubinemia Patient follows up with oncology Dr. Truett Perna for [facility].   8/9, he underwent Y90 (radioembolization) of left lobe of liver by IR He has chronic  mild elevation of LFTs.  Labs on admission shows significant rise in AST and bilirubin level.  Oncology consult appreciated.  Rising LFT and bilirubin likely due to radioembolization last week.  Gradually improving Albumin low, INR 1.5. seen by oncology Dr. Truett Perna.  Gradually improving LFTs.  Continue to monitor as an outpatient. I would keep statin on hold till liver enzymes improved. Recent Labs  Lab 04/07/23 0943 04/08/23 0410 04/09/23 0415 04/10/23 0419 04/11/23 0413  AST 144* 107* 69* 61* 66*  ALT 27 26 25 23 26   ALKPHOS 207* 187* 185* 197* 219*  BILITOT 3.9* 3.8* 3.6* 3.0* 2.9*  PROT 5.8* 5.8* 5.8* 5.9* 6.6  ALBUMIN 2.6* 1.8* 1.7* 1.8* 1.9*  INR  --  1.5* 1.4*  --   --   PLT 345 300 301 318 441*   Hypoalbuminemia Malnutrition Albumin level <2.  ID consult appreciated.  Started on supplements.  Type 2 diabetes mellitus A1c 7 on March 2024 PTA meds-Jardiance 10 mg daily, Actos 30 mg daily, Januvia 100 mg daily Continue Jardiance and Januvia.  I will stop Actos as it could contribute to fluid overload. Recent Labs  Lab 04/07/23 1741 04/07/23 2051 04/08/23 0748 04/08/23 1155  GLUCAP 110* 137* 98 134*   CAD/PAD/HLD Continue continue aspirin and beta-blocker. Statin held due to worsening of the liver function. Continue fenofibrate  Mild hematuria 8/27, patient reported drops of blood after urination.  His wife showed me a picture of it.  Had not noticed that at home Urinalysis at presentation showed large amount of hemoglobin. Most recent CT pelvis from 5/24 did not show any remarkable findings in the visualized ureter and urinary bladder. Patient states that hematuria has stopped.  Has not recurred in last 48 hours. Continue aspirin  Chronic anemia Continue PPI, iron supplement,  Recent Labs    02/11/23 1357 02/11/23 1358 03/06/23 0846 04/07/23 0943 04/08/23 0410 04/09/23 0415 04/10/23 0419 04/11/23 0413  HGB 8.2*  --    < > 10.4* 9.1* 9.5* 10.3* 10.9*   MCV 96.3  --    < > 89.6 89.8 88.0 88.8 87.9  FERRITIN  --  243  --   --   --   --   --   --   RETICCTPCT 3.4*  --   --   --   --   --   --   --    < > = values in this interval not displayed.   Mobility: PT eval obtained.  No follow-up recommended.  Goals of care   Code Status: Full Code   Wounds:  -    Discharge Exam:   Vitals:   04/10/23 2211 04/11/23 0400 04/11/23 0421 04/11/23 0431  BP:  118/73 118/73   Pulse: (!) 123 63 63   Resp:  20 20   Temp:  97.9 F (36.6 C) 97.9 F (36.6 C)   TempSrc:  Oral Oral   SpO2:  97% 97%   Weight:    88.8 kg  Height:        Body mass index is 28.09 kg/m.   General exam: Pleasant, elderly Caucasian male.  No new symptoms Skin: No rashes, lesions or ulcers. HEENT: Atraumatic, normocephalic, no obvious bleeding Lungs: Clear to auscultation bilaterally CVS: Irregular fast rhythm, no murmur GI/Abd soft, nontender, mild  distention present, bowel sound present CNS: Alert, awake, oriented x 3 Psychiatry: Mood appropriate Extremities: Improving bilateral pedal edema, no calf tenderness  Follow ups:    Follow-up Information     Runell Gess, MD Follow up on 04/29/2023.   Specialties: Cardiology, Radiology Why: 9:00AM. Cardiology follow up Contact information: [address] [place] [address] [phone]         Donita Brooks, MD Follow up.   Specialty: Family Medicine Contact information: [address] [place] [address] [phone]                 Discharge Instructions:   Discharge Instructions     Call MD for:  difficulty breathing, headache or visual disturbances   Complete by: As directed    Call MD for:  extreme fatigue   Complete by: As directed    Call MD for:  hives   Complete by: As directed    Call MD for:  persistant dizziness or light-headedness   Complete by: As directed    Call MD for:  persistant nausea and vomiting   Complete by: As directed     Call MD for:  severe uncontrolled pain   Complete by: As directed    Call MD for:  temperature >100.4   Complete by: As directed    Diet general   Complete by: As directed    Discharge instructions   Complete by: As directed    Recommendations at discharge:   Acute CHF/hypertension meds have been adjusted to metoprolol twice a day, Lasix twice a day  Continue potassium supplement with Lasix.  Continue amiodarone as before  For diabetes, continue Jardiance and Januvia.  Stop Actos  Discharge instructions for CHF Check weight daily -preferably same time every day. Restrict fluid intake to 1200 ml daily Restrict salt intake to less than 2 g daily. Call MD if you have one of the following symptoms 1) 3 pound weight gain in 24 hours or 5 pounds in 1 week  2) swelling in the hands, feet or stomach  3) progressive shortness of breath 4) if you have to sleep on extra pillows at night in order to breathe     General discharge instructions: Follow with Primary MD Donita Brooks, MD in 7 days  Please request your PCP  to go over your hospital tests, procedures, radiology results at the follow up. Please get your medicines reviewed and adjusted.  Your PCP may decide to repeat certain labs or tests as needed. Do not drive, operate heavy machinery, perform activities at heights, swimming or participation in water activities or provide baby sitting services if your were admitted for syncope or siezures until you have seen by Primary MD or a Neurologist and advised to do so again. [place] Controlled Substance Reporting System database was reviewed. Do not drive, operate heavy machinery, perform activities at heights, swim, participate in water activities or provide baby-sitting services while on medications for pain, sleep and mood until your outpatient physician has reevaluated you and advised to do so again.  You are strongly recommended to comply with the dose, frequency and duration  of prescribed medications. Activity: As tolerated with Full fall precautions use walker/cane & assistance as needed Avoid using any recreational substances like cigarette, tobacco, alcohol, or non-prescribed drug. If you experience worsening of your admission symptoms, develop shortness of breath, life threatening emergency, suicidal or homicidal thoughts you must seek medical attention immediately by calling  911 or calling your MD immediately  if symptoms less severe. You must read complete instructions/literature along with all the possible adverse reactions/side effects for all the medicines you take and that have been prescribed to you. Take any new medicine only after you have completely understood and accepted all the possible adverse reactions/side effects.  Wear Seat belts while driving. You were cared for by a hospitalist during your hospital stay. If you have any questions about your discharge medications or the care you received while you were in the hospital after you are discharged, you can call the unit and ask to speak with the hospitalist or the covering physician. Once you are discharged, your primary care physician will handle any further medical issues. Please note that NO REFILLS for any discharge medications will be authorized once you are discharged, as it is imperative that you return to your primary care physician (or establish a relationship with a primary care physician if you do not have one).   Increase activity slowly   Complete by: As directed        Discharge Medications:   Allergies as of 04/11/2023       Reactions   Penicillins Other (See Comments)   Unsure, childhood reaction Has patient had a PCN reaction causing immediate rash, facial/tongue/throat swelling, SOB or lightheadedness with hypotension: Unknown Has patient had a PCN reaction causing severe rash involving mucus membranes or skin necrosis: Unknown Has patient had a PCN reaction that required  hospitalization: No Has patient had a PCN reaction occurring within the last 10 years: No If all of the above answers are "NO", then may proceed with Cephalosporin use.        Medication List     STOP taking these medications    atorvastatin 80 MG tablet Commonly known as: LIPITOR   carvedilol 12.5 MG tablet Commonly known as: COREG   dicyclomine 10 MG capsule Commonly known as: BENTYL   mirabegron ER 25 MG Tb24 tablet Commonly known as: Myrbetriq   oxyCODONE 5 MG immediate release tablet Commonly known as: Roxicodone   pioglitazone 30 MG tablet Commonly known as: ACTOS   sildenafil 100 MG tablet Commonly known as: Viagra       TAKE these medications    amiodarone 200 MG tablet Commonly known as: PACERONE Take 1 tablet (200 mg total) by mouth daily.   apixaban 5 MG Tabs tablet Commonly known as: Eliquis Take 1 tablet (5 mg total) by mouth 2 (two) times daily.   aspirin EC 81 MG tablet Take 1 tablet (81 mg total) by mouth daily.   B-D UF III MINI PEN NEEDLES 31G X 5 MM Misc Generic drug: Insulin Pen Needle Use daily with Victoza   cyanocobalamin 1000 MCG tablet Commonly known as: VITAMIN B12 Take 1,000 mcg by mouth daily.   diltiazem 30 MG tablet Commonly known as: CARDIZEM TAKE 1 TABLET (30 MG TOTAL) BY MOUTH 3 (THREE) TIMES DAILY AS NEEDED (PALPITATIONS).   empagliflozin 10 MG Tabs tablet Commonly known as: JARDIANCE Take 1 tablet (10 mg total) by mouth daily.   Entyvio 300 MG injection Generic drug: vedolizumab Inject 300 mg as directed See admin instructions. Every 2 months   feeding supplement Liqd Take 237 mLs by mouth 2 (two) times daily between meals.   fenofibrate 160 MG tablet Take 1 tablet (160 mg total) by mouth daily.   Fish Oil 1000 MG Caps Take 1,000 mg by mouth 2 (two) times daily.   furosemide 40 MG  tablet Commonly known as: LASIX Take 1 tablet (40 mg total) by mouth 2 (two) times daily at 10 am and 4 pm. What changed:   when to take this reasons to take this   Iron (Ferrous Sulfate) 325 (65 Fe) MG Tabs Take 325 mg by mouth daily. What changed: when to take this   metoprolol tartrate 50 MG tablet Commonly known as: LOPRESSOR Take 1 tablet (50 mg total) by mouth 2 (two) times daily.   OneTouch Ultra test strip Generic drug: glucose blood CHECK BLOOD SUGAR TWICE A DAY   Pain Relief Extra Strength 500 MG tablet Generic drug: acetaminophen Take 1 tablet (500 mg total) by mouth every 6 (six) hours as needed. What changed: reasons to take this   pantoprazole 40 MG tablet Commonly known as: PROTONIX Take 1 tablet (40 mg total) by mouth 2 (two) times daily.   potassium chloride SA 20 MEQ tablet Commonly known as: KLOR-CON M Take 2 tablets (40 mEq total) by mouth daily. Start taking on: April 12, 2023   sitaGLIPtin 100 MG tablet Commonly known as: Januvia Take 1 tablet (100 mg total) by mouth daily.   solifenacin 10 MG tablet Commonly known as: VESICARE Take 1 tablet (10 mg total) by mouth daily.         The results of significant diagnostics from this hospitalization (including imaging, microbiology, ancillary and laboratory) are listed below for reference.    Procedures and Diagnostic Studies:   No results found.   Labs:   Basic Metabolic Panel: Recent Labs  Lab 04/07/23 0943 04/08/23 0410 04/09/23 0415 04/10/23 0419 04/11/23 0413  NA 134* 132* 140 137 140  K 4.5 4.0 3.4* 3.2* 3.8  CL 101 101 102 100 99  CO2 25 23 25 25 30   GLUCOSE 153* 109* 101* 107* 111*  BUN 20 25* 31* 39* 42*  CREATININE 1.42* 1.52* 1.42* 1.34* 1.47*  CALCIUM 8.1* 7.9* 8.3* 8.0* 8.5*   GFR Estimated Creatinine Clearance: 54.7 mL/min (A) (by C-G formula based on SCr of 1.47 mg/dL (H)). Liver Function Tests: Recent Labs  Lab 04/07/23 0943 04/08/23 0410 04/09/23 0415 04/10/23 0419 04/11/23 0413  AST 144* 107* 69* 61* 66*  ALT 27 26 25 23 26   ALKPHOS 207* 187* 185* 197* 219*  BILITOT 3.9*  3.8* 3.6* 3.0* 2.9*  PROT 5.8* 5.8* 5.8* 5.9* 6.6  ALBUMIN 2.6* 1.8* 1.7* 1.8* 1.9*   No results for input(s): "LIPASE", "AMYLASE" in the last 168 hours. No results for input(s): "AMMONIA" in the last 168 hours. Coagulation profile Recent Labs  Lab 04/08/23 0410 04/09/23 0415  INR 1.5* 1.4*    CBC: Recent Labs  Lab 04/07/23 0943 04/08/23 0410 04/09/23 0415 04/10/23 0419 04/11/23 0413  WBC 11.8* 10.8* 10.1 9.5 12.3*  NEUTROABS 10.0*  --  8.3* 7.8* 9.9*  HGB 10.4* 9.1* 9.5* 10.3* 10.9*  HCT 33.5* 29.0* 30.2* 33.2* 34.2*  MCV 89.6 89.8 88.0 88.8 87.9  PLT 345 300 301 318 441*   Cardiac Enzymes: No results for input(s): "CKTOTAL", "CKMB", "CKMBINDEX", "TROPONINI" in the last 168 hours. BNP: Invalid input(s): "POCBNP" CBG: Recent Labs  Lab 04/07/23 1741 04/07/23 2051 04/08/23 0748 04/08/23 1155  GLUCAP 110* 137* 98 134*   D-Dimer No results for input(s): "DDIMER" in the last 72 hours. Hgb A1c No results for input(s): "HGBA1C" in the last 72 hours. Lipid Profile No results for input(s): "CHOL", "HDL", "LDLCALC", "TRIG", "CHOLHDL", "LDLDIRECT" in the last 72 hours. Thyroid function studies No results for input(s): "  TSH", "T4TOTAL", "T3FREE", "THYROIDAB" in the last 72 hours.  Invalid input(s): "FREET3" Anemia work up No results for input(s): "VITAMINB12", "FOLATE", "FERRITIN", "TIBC", "IRON", "RETICCTPCT" in the last 72 hours. Microbiology No results found for this or any previous visit (from the past 240 hour(s)).  Time coordinating discharge: 45 minutes  Signed: Dalaysia Harms  [facility] 04/11/2023, 11:19 AM

## 2023-04-11 NOTE — Progress Notes (Signed)
Rounding Note    Patient Name: Matthew Chen Date of Encounter: 04/11/2023  [facility] Cardiologist: Nanetta Batty, MD   Subjective   He feels much better. Ready to go   Interval Hx: Converted to sinus rhythm -diuresing well  Inpatient Medications    Scheduled Meds:  amiodarone  200 mg Oral Daily   aspirin EC  81 mg Oral Daily   cyanocobalamin  1,000 mcg Oral Daily   empagliflozin  10 mg Oral Daily   feeding supplement  237 mL Oral BID BM   fenofibrate  160 mg Oral Daily   ferrous sulfate  325 mg Oral Daily   fesoterodine  4 mg Oral Daily   furosemide  40 mg Oral BID   linagliptin  5 mg Oral Daily   metoprolol tartrate  50 mg Oral BID   omega-3 acid ethyl esters  1 g Oral BID WC   pantoprazole  40 mg Oral BID   pioglitazone  30 mg Oral Daily   potassium chloride  40 mEq Oral Daily   Continuous Infusions:   PRN Meds: acetaminophen **OR** acetaminophen, mouth rinse, prochlorperazine   Vital Signs    Vitals:   04/10/23 2211 04/11/23 0400 04/11/23 0421 04/11/23 0431  BP:  118/73 118/73   Pulse: (!) 123 63 63   Resp:  20 20   Temp:  97.9 F (36.6 C) 97.9 F (36.6 C)   TempSrc:  Oral Oral   SpO2:  97% 97%   Weight:    88.8 kg  Height:        Intake/Output Summary (Last 24 hours) at 04/11/2023 0912 Last data filed at 04/11/2023 0750 Gross per 24 hour  Intake 1640 ml  Output 2795 ml  Net -1155 ml      04/11/2023    4:31 AM 04/10/2023    7:02 AM 04/09/2023    5:00 AM  Last 3 Weights  Weight (lbs) 195 lb 12.8 oz 200 lb 9.9 oz 204 lb 3.2 oz  Weight (kg) 88.814 kg 91 kg 92.625 kg      Telemetry    Sinus rhythm with PACs - Personally Reviewed  ECG    NA - Personally Reviewed  Physical Exam   GEN: No acute distress.   Cardiac: RRR, systolic  murmur  Respiratory: Clear to auscultation bilaterally. GI: less distended   MS:  LE edema BL pitting; improved. Neuro:  Nonfocal  Psych: Normal affect   Labs    High Sensitivity Troponin:   No results for input(s): "TROPONINIHS" in the last 720 hours.   Chemistry Recent Labs  Lab 04/09/23 0415 04/10/23 0419 04/11/23 0413  NA 140 137 140  K 3.4* 3.2* 3.8  CL 102 100 99  CO2 25 25 30   GLUCOSE 101* 107* 111*  BUN 31* 39* 42*  CREATININE 1.42* 1.34* 1.47*  CALCIUM 8.3* 8.0* 8.5*  PROT 5.8* 5.9* 6.6  ALBUMIN 1.7* 1.8* 1.9*  AST 69* 61* 66*  ALT 25 23 26   ALKPHOS 185* 197* 219*  BILITOT 3.6* 3.0* 2.9*  GFRNONAA 54* 58* 52*  ANIONGAP 13 12 11     Lipids No results for input(s): "CHOL", "TRIG", "HDL", "LABVLDL", "LDLCALC", "CHOLHDL" in the last 168 hours.  Hematology Recent Labs  Lab 04/09/23 0415 04/10/23 0419 04/11/23 0413  WBC 10.1 9.5 12.3*  RBC 3.43* 3.74* 3.89*  HGB 9.5* 10.3* 10.9*  HCT 30.2* 33.2* 34.2*  MCV 88.0 88.8 87.9  MCH 27.7 27.5 28.0  MCHC 31.5 31.0 31.9  RDW 19.9* 20.0* 20.3*  PLT 301 318 441*   Thyroid No results for input(s): "TSH", "FREET4" in the last 168 hours.  BNP Recent Labs  Lab 04/07/23 0943  BNP 1,374.1*    DDimer No results for input(s): "DDIMER" in the last 168 hours.   Radiology    No results found.  Cardiac Studies   Echo 03/25/23:  1. Left ventricular ejection fraction, by estimation, is 45 to 50%. The  left ventricle has mildly decreased function. The left ventricle  demonstrates global hypokinesis. Left ventricular diastolic parameters are  indeterminate.   2. Right ventricular systolic function is normal. The right ventricular  size is normal. There is mildly elevated pulmonary artery systolic  pressure. The estimated right ventricular systolic pressure is 39.5 mmHg.   3. Left atrial size was severely dilated.   4. Right atrial size was mildly dilated.   5. The mitral valve is normal in structure. Mild to moderate mitral valve  regurgitation. No evidence of mitral stenosis.   6. The aortic valve is bicuspid. There is moderate calcification of the  aortic valve. There is moderate thickening of the aortic  valve. Aortic  valve regurgitation is not visualized. Aortic valve sclerosis is present,  with no evidence of aortic valve  stenosis.   7. Aortic dilatation noted. There is mild dilatation of the aortic root,  measuring 43 mm. There is mild dilatation of the ascending aorta,  measuring 41 mm.   8. The inferior vena cava is normal in size with greater than 50%  respiratory variability, suggesting right atrial pressure of 3 mmHg.   Comparison(s): Prior images reviewed side by side. The left ventricular  function is worsened. Prior EF 65%.   Patient Profile     67 y.o. male with a hx of persistent atrial fibrillation, chronic diastolic heart failure, thoracic aortic aneurysm, bicuspid aortic valve, CKD stage III, CAD, hypertension, hyperlipidemia, DM2, PAD, hx of tobacco abuse, and hepatocellular carcinoma who is being seen for the evaluation of CHF exacerbation.  Assessment & Plan    Acute on chronic systolic and diastolic heart failure Mild to moderate MR Hepatocellular carcinoma  Hypoalbuminemia Elevated LFTs Acute on chronic kidney disease III, hematuria - recent echo with LVEF 45-50% with prior echo consistent with grade 2 DD - BNP 1374 - LE and abdominal distension likely more related to liver function - would add spironolactone but he doesn't have the best BP room - baseline sCr 1.0-1.3, stable - out net negative 11L total - down to 195.8 from 218 lbs - will change to oral lasix 40 mg BID; recommend BMET in 2 weeks   Persistent Afib - at home on amiodarone 200 mg daily with coreg and PRN cardizem - QTC was 487 ms, failed tikosyn previously - unfortunately converted to Afib RVR overnight with rates now in the 100-120s - switched coreg to lopressor for more pressure room to rate control 8/28 - started IV amiodarone 8/28 - converted back to sinus rhythm - can continue metop tartrate 50 mg BID, stop coreg -continue home eliquis   Bicuspid AV/Calcified -  stable  DM2: - pioglitazone is not great with VO, would consider stopping  CAD - known D1 stenosis, intervention c/b coronary perforation - treated medically - no current chest pain    PAD - multiple prior interventions   He can be discharged from a cardiac perspective. Will arrange FU   For questions or updates, please contact [facility] Please consult [email or web address] for contact info  under        Signed, Maisie Fus, MD  04/11/2023, 9:12 AM

## 2023-04-11 NOTE — Consult Note (Signed)
[facility] Customer service manager [facility]) Accountable Care Organization (ACO) [facility] Liaison Note  04/11/2023  Matthew Chen 29-Jun-1956 [id number]  Location: [facility] RN Hospital Liaison screened the patient remotely at [facility].  Insurance: [employer]   Matthew Chen is a 68 y.o. male who is a Primary Care Patient of Donita Brooks, MD. The patient was screened for readmission hospitalization with noted extreme risk score for unplanned readmission risk with 2 IP/1 ED in 6 months.  The patient was assessed for potential [facility]) Care Management service needs for post hospital transition for care coordination. Review of patient's electronic medical record reveals patient was admitted with CHF. Pt is active with [facility]. Liaison attempted outreach to pt both inpt and mobile noted in EPIC however unsuccessful. Liaison collaborated with Revonda Standard at [employer]  concerning pt's discharge disposition. [facility] will continue to assist with care management needs.    [facility] Care Management/Population Health does not replace or interfere with any arrangements made by the Inpatient Transition of Care team.   For questions contact:   Elliot Cousin, RN, [facility] Liaison [place]   Population Health Office Hours MTWF  8:00 am-6:00 pm [phone] mobile [phone] [Office toll free line] Office Hours are M-F 8:30 - 5 pm [email or web address]

## 2023-04-12 DIAGNOSIS — I5022 Chronic systolic (congestive) heart failure: Secondary | ICD-10-CM | POA: Diagnosis not present

## 2023-04-15 ENCOUNTER — Telehealth: Payer: Self-pay

## 2023-04-15 NOTE — Transitions of Care (Post Inpatient/ED Visit) (Signed)
   04/15/2023  Name: Matthew Chen MRN: [id number] DOB: [birth year]  Today's TOC FU Call Status: Today's TOC FU Call Status:: Unsuccessful Call (1st Attempt) Unsuccessful Call (1st Attempt) Date: 04/15/23  Attempted to reach the patient regarding the most recent Inpatient/ED visit.  Follow Up Plan: Additional outreach attempts will be made to reach the patient to complete the Transitions of Care (Post Inpatient/ED visit) call.   Jodelle Gross RN, BSN, CCM [facility] RN Care Coordinator/ Transitions of Care Direct Dial: [phone]  Fax: [phone]

## 2023-04-16 ENCOUNTER — Telehealth: Payer: Self-pay

## 2023-04-16 NOTE — Transitions of Care (Post Inpatient/ED Visit) (Signed)
04/16/2023  Name: Matthew Chen MRN: [id number] DOB: September 26, 1955  Today's TOC FU Call Status: Today's TOC FU Call Status:: Successful TOC FU Call Completed TOC FU Call Complete Date: 04/16/23 Patient's Name and Date of Birth confirmed.  Transition Care Management Follow-up Telephone Call Date of Discharge: 04/11/23 Discharge Facility: [facility]) Type of Discharge: Inpatient Admission Primary Inpatient Discharge Diagnosis:: Acute on Chronic Systolic Congestive Heart Failure How have you been since you were released from the hospital?: Better (Patient is in [place] on vacation with spouse) Any questions or concerns?: No  Items Reviewed: Did you receive and understand the discharge instructions provided?: Yes Medications obtained,verified, and reconciled?: Yes (Medications Reviewed) Any new allergies since your discharge?: No Dietary orders reviewed?: No Do you have support at home?: Yes People in Home: spouse Name of Support/Comfort Primary Source: [name]  Medications Reviewed Today: Medications Reviewed Today     Reviewed by Jodelle Gross, RN (Case Manager) on 04/16/23 at [phone]  Med List Status: <None>   Medication Order Taking? Sig Documenting Provider Last Dose Status Informant  acetaminophen (TYLENOL) 500 MG tablet [id number] Yes Take 1 tablet (500 mg total) by mouth every 6 (six) hours as needed.  Patient taking differently: Take 500 mg by mouth every 6 (six) hours as needed for moderate pain.   Derwood Kaplan, MD Taking Active Self, Spouse/Significant Other  amiodarone (PACERONE) 200 MG tablet [id number] Yes Take 1 tablet (200 mg total) by mouth daily. Graciella Freer, [name] Taking Active Self, Spouse/Significant Other  apixaban (ELIQUIS) 5 MG TABS tablet [id number] Yes Take 1 tablet (5 mg total) by mouth 2 (two) times daily. Narda Bonds, MD Taking Active Self, Spouse/Significant Other  aspirin EC 81 MG tablet [id number] Yes Take 1 tablet (81 mg total) by  mouth daily. Chilton Si, MD Taking Active Self, Spouse/Significant Other  cyanocobalamin (VITAMIN B12) 1000 MCG tablet [id number] Yes Take 1,000 mcg by mouth daily. [provider] Taking Active Self, Spouse/Significant Other  diltiazem (CARDIZEM) 30 MG tablet [id number] Yes TAKE 1 TABLET (30 MG TOTAL) BY MOUTH 3 (THREE) TIMES DAILY AS NEEDED (PALPITATIONS). Joylene Grapes, NP Taking Active Self, Spouse/Significant Other  empagliflozin (JARDIANCE) 10 MG TABS tablet [id number] Yes Take 1 tablet (10 mg total) by mouth daily. Joylene Grapes, NP Taking Active Self, Spouse/Significant Other  feeding supplement (ENSURE ENLIVE / ENSURE PLUS) LIQD [id number] Yes Take 237 mLs by mouth 2 (two) times daily between meals. Lorin Glass, MD Taking Active   fenofibrate 160 MG tablet [id number] Yes Take 1 tablet (160 mg total) by mouth daily. Donita Brooks, MD Taking Active Self, Spouse/Significant Other  furosemide (LASIX) 40 MG tablet [id number] Yes Take 1 tablet (40 mg total) by mouth 2 (two) times daily at 10 am and 4 pm. Lorin Glass, MD Taking Active   glucose blood (ONETOUCH ULTRA) test strip [id number] Yes CHECK BLOOD SUGAR TWICE A DAY Pickard, Priscille Heidelberg, MD Taking Active Self, Spouse/Significant Other  Insulin Pen Needle (B-D UF III MINI PEN NEEDLES) 31G X 5 MM MISC [id number] Yes Use daily with Geralyn Flash, MD Taking Active Self, Spouse/Significant Other  Iron, Ferrous Sulfate, 325 (65 Fe) MG TABS [id number] Yes Take 325 mg by mouth daily.  Patient taking differently: Take 325 mg by mouth in the morning and at bedtime.   Donita Brooks, MD Taking Active Self, Spouse/Significant Other  metoprolol tartrate (LOPRESSOR) 50 MG tablet [id number] Yes Take 1 tablet (50 mg total) by mouth 2 (two)  times daily. Lorin Glass, MD Taking Active   Omega-3 Fatty Acids (FISH OIL) 1000 MG CAPS [id number] Yes Take 1,000 mg by mouth 2 (two) times daily. [provider] Taking Active Self,  Spouse/Significant Other  pantoprazole (PROTONIX) 40 MG tablet [id number] Yes Take 1 tablet (40 mg total) by mouth 2 (two) times daily. Donita Brooks, MD Taking Active Self, Spouse/Significant Other  potassium chloride SA (KLOR-CON M) 20 MEQ tablet [id number] Yes Take 2 tablets (40 mEq total) by mouth daily. Lorin Glass, MD Taking Active   sitaGLIPtin (JANUVIA) 100 MG tablet [id number] Yes Take 1 tablet (100 mg total) by mouth daily. Park Meo, FNP Taking Active Self, Spouse/Significant Other  solifenacin (VESICARE) 10 MG tablet [id number] Yes Take 1 tablet (10 mg total) by mouth daily. Donita Brooks, MD Taking Active Self, Spouse/Significant Other  vedolizumab (ENTYVIO) 300 MG injection [id number] Yes Inject 300 mg as directed See admin instructions. Every 2 months [provider] Taking Active Self, Spouse/Significant Other           Med Note Kandis Cocking Kirt Boys Apr 07, 2023  5:38 PM)              Home Care and Equipment/Supplies: Were Home Health Services Ordered?: No Any new equipment or medical supplies ordered?: No  Functional Questionnaire: Do you need assistance with bathing/showering or dressing?: No Do you need assistance with meal preparation?: No Do you need assistance with eating?: No Do you have difficulty maintaining continence: No Do you need assistance with getting out of bed/getting out of a chair/moving?: No Do you have difficulty managing or taking your medications?: No  Follow up appointments reviewed: PCP Follow-up appointment confirmed?: Yes Date of PCP follow-up appointment?: 04/21/23 Follow-up Provider: Dr. Tanya Nones Specialist [facility] Follow-up appointment confirmed?: NA Do you need transportation to your follow-up appointment?: No Do you understand care options if your condition(s) worsen?: Yes-patient verbalized understanding  SDOH Interventions Today    Flowsheet Row Most Recent Value  SDOH Interventions   Food  Insecurity Interventions Intervention Not Indicated  Housing Interventions Intervention Not Indicated  Transportation Interventions Intervention Not Indicated     Jodelle Gross RN, BSN, CCM [facility] RN Care Coordinator/ Transitions of Care Direct Dial: [phone]  Fax: [phone]

## 2023-04-18 ENCOUNTER — Ambulatory Visit
Admission: RE | Admit: 2023-04-18 | Discharge: 2023-04-18 | Disposition: A | Discharge status: Home / Self Care | Payer: PPO | Source: Ambulatory Visit | Attending: Interventional Radiology

## 2023-04-18 DIAGNOSIS — C220 Liver cell carcinoma: Secondary | ICD-10-CM | POA: Diagnosis not present

## 2023-04-18 HISTORY — PX: IR RADIOLOGIST EVAL & MGMT: IMG5224

## 2023-04-18 NOTE — Progress Notes (Signed)
Reason for follow up: Patient presents today via virtual telephone visit after left lobar Y90 on 03/21/23   Referring Physician(s): Sherrill,Gary B   History of present illness: Matthew Chen, 67 year old male, has a medical history significant for DM2, HTN, PAD, paroxysmal atrial fibrillation, CKD stage III, thoracic aortic aneurysm and hepatocellular carcinoma. He was referred to our team in 2022 for targeted liver therapy for a 7 cm unifocal liver mass. In the absence of significant liver disease and given the size of the mass a hepatectomy was not indicated. He was deemed a candidate for locoregional therapy with radiation segmentectomy. Split dose TARE was delivered via segment 6 and 7 branches to the right hepatic mass 06/21/21. The patient tolerated the procedure well.    An MRI obtained 2 months after the TARE showed a similarly sized mass with change in enhancement pattern of early enhancing lesion. Image findings attributed to post-radiation effect versus residual active carcinoma. His MRI at 6 months post-procedure demonstrated viability of the index mass in the right lobe of the liver without significant change in size but also revealed multifocal left lobe masses. The patient last followed up with me Dec 27, 2021 and we discussed the possibility of left lobar transarterial radioembolization and conventional transarterial chemoembolization to the previously treated right lobe mass. I reached out to Dr. Truett Perna for further discussion and the patient's case was presented at the GI tumor conference 02/01/22. The consensus recommendation was for systemic therapy.    He underwent 8 cycles of atezolizumab/bevacizumab. MR imaging 07/22/22 and 08/19/22 showed disease progression and he has been on several additional therapies since then. His most recent treatment with Cabozantinib was placed on hold 12/31/2022 due to drug discontinuation. He is currently off systemic therapy.    His case was again  presented at the GI tumor conference 01/29/23 and IR has been asked to evaluate Matthew Chen for additional hepatic directed therapy.   He remains off systemic treatment due to side effects including diarrhea and bowel incontinence, rectal pain, oral lesions, etc.   He reports a recent fever and feeling ill, thought has has resolved.  He has had a good appetite.  Reports mild yellowing of skin, none in sclera.  He does not recall any adverse reaction to prior Y-90.  At planning angiogram for planned radiation segmentectomy to unifocal bilobar [facility], Matthew Chen was found to have progressive, multifocal, bilobar disease.  A-FP was drawn which was up from 9.7 (12/09/22) to 42.1 (03/07/23).  He therefore underwent left lobar radioembolization on 03/21/23 which went as planned without complication. He is currently scheduled for right lobar radioembolization on 04/24/23.  He was recently admitted to [facility] from 04/07/23 and was discharged from [facility] on Friday 04/11/23.  He went on vacation with his family immediately after to [place]. While at a restaurant there he experienced a fall without loss of consciousness and suffered a laceration to his arm.  He still has significant soreness from this.  While admitted for CHF recently, he experienced atrial fibrillation with RVR, which was corrected.  He follows up with Cardiology next week.  He experienced hematuria while admitted, which has resolved completely.  His wife has noticed some worsening scleral icterus and jaundice.  He reports a worsening appetite but still eats.  No nausea or vomiting, no stool changes.  Approximately 10 days after left sided TARE, he reported aching, dry throat, nasal scabs developing, and overall feeling weak.  This progressed until he saw Dr. Truett Perna on 8/26  at which time he was admitted.  Those symptoms have completely resolved.      Past Medical History:  Diagnosis Date   Adenomatous colon polyp 03/2008   Anginal pain (HCC)    Aortic aneurysm,  thoracic (HCC) 09/09/2016   4.4 cm by echo 05/2015   Arthritis    "joints in my fingers ache" (07/13/2015)   Bicuspid aortic valve 09/09/2016   Cataract 2019   bilateral eyes   CKD (chronic kidney disease), stage III (HCC)    Coronary artery disease    Diabetes mellitus without complication (HCC)    Hemorrhoids    Hepatocellular carcinoma (HCC)    Hyperlipidemia    Hypertension    Paroxysmal atrial fibrillation (HCC)    Peripheral arterial disease (HCC)    a. dopppler 05/29/2015 revealed high-grade right popliteal and distal left SFA disease b. LE angio 06/28/2015 revealing high grade calcific dx with distal L SFA and R popliteal artery s/p diamondback orbital rotational atherectomy, PTA of L SFA  c. 07/13/2015 diamondback orbital rotational atherectomy and drug eluting balloon angioplasty of R popliteal artery (P2 segment) using distal protection    Type II diabetes mellitus (HCC)    Ulcerative colitis     Past Surgical History:  Procedure Laterality Date   ABDOMINAL AORTOGRAM W/LOWER EXTREMITY N/A 04/27/2018   Procedure: ABDOMINAL AORTOGRAM W/LOWER EXTREMITY;  Surgeon: Runell Gess, MD;  Location: [facility] INVASIVE CV LAB;  Service: Cardiovascular;  Laterality: N/A;   BIOPSY  01/03/2023   Procedure: BIOPSY;  Surgeon: Benancio Deeds, MD;  Location: [facility] ENDOSCOPY;  Service: Gastroenterology;;   COLONOSCOPY     COLONOSCOPY W/ POLYPECTOMY  X 4-5   CORONARY ATHERECTOMY N/A 02/15/2021   Procedure: CORONARY ATHERECTOMY;  Surgeon: Runell Gess, MD;  Location: [facility] INVAS[facility] CV LAB;  Service: Cardiovascular;  Laterality: N/A;  aborted    CORONARY BALLOON ANGIOPLASTY N/A 02/15/2021   Procedure: CORONARY BALLOON ANGIOPLASTY;  Surgeon: Runell Gess, MD;  Location: [facility] INVAS[facility] CV LAB;  Service: Cardiovascular;  Laterality: N/A;   COSMETIC SURGERY  < 2000   "removed birthmark from top of my head"   FLEXIBLE SIGMOIDOSCOPY N/A 01/03/2023   Procedure: FLEXIBLE SIGMOIDOSCOPY;  Surgeon:  Benancio Deeds, MD;  Location: [facility] ENDOSCOPY;  Service: Gastroenterology;  Laterality: N/A;   IR ANGIOGRAM SELECTIVE EACH ADDITIONAL VESSEL  06/08/2021   IR ANGIOGRAM SELECTIVE EACH ADDITIONAL VESSEL  06/08/2021   IR ANGIOGRAM SELECTIVE EACH ADDITIONAL VESSEL  06/08/2021   IR ANGIOGRAM SELECTIVE EACH ADDITIONAL VESSEL  06/08/2021   IR ANGIOGRAM SELECTIVE EACH ADDITIONAL VESSEL  06/21/2021   IR ANGIOGRAM SELECTIVE EACH ADDITIONAL VESSEL  06/21/2021   IR ANGIOGRAM SELECTIVE EACH ADDITIONAL VESSEL  06/21/2021   IR ANGIOGRAM SELECTIVE EACH ADDITIONAL VESSEL  06/21/2021   IR ANGIOGRAM SELECTIVE EACH ADDITIONAL VESSEL  03/07/2023   IR ANGIOGRAM SELECTIVE EACH ADDITIONAL VESSEL  03/07/2023   IR ANGIOGRAM SELECTIVE EACH ADDITIONAL VESSEL  03/21/2023   IR ANGIOGRAM SELECTIVE EACH ADDITIONAL VESSEL  03/21/2023   IR ANGIOGRAM VISCERAL SELECTIVE  06/08/2021   IR ANGIOGRAM VISCERAL SELECTIVE  06/21/2021   IR ANGIOGRAM VISCERAL SELECTIVE  03/07/2023   IR ANGIOGRAM VISCERAL SELECTIVE  03/21/2023   IR EMBO ARTERIAL NOT HEMORR HEMANG INC GUIDE ROADMAPPING  06/08/2021   IR EMBO TUMOR ORGAN ISCHEMIA INFARCT INC GUIDE ROADMAPPING  06/21/2021   IR EMBO TUMOR ORGAN ISCHEMIA INFARCT INC GUIDE ROADMAPPING  06/21/2021   IR EMBO TUMOR ORGAN ISCHEMIA INFARCT INC GUIDE ROADMAPPING  03/21/2023   IR RADIOLOGIST  EVAL & MGMT  05/08/2021   IR RADIOLOGIST EVAL & MGMT  07/26/2021   IR RADIOLOGIST EVAL & MGMT  09/05/2021   IR RADIOLOGIST EVAL & MGMT  12/27/2021   IR RADIOLOGIST EVAL & MGMT  02/05/2023   IR US GUIDE VASC ACCESS LEFT  06/21/2021   IR US GUIDE VASC ACCESS LEFT  03/07/2023   IR US GUIDE VASC ACCESS LEFT  03/07/2023   IR US GUIDE VASC ACCESS LEFT  03/07/2023   IR US GUIDE VASC ACCESS LEFT  03/21/2023   IR US GUIDE VASC ACCESS RIGHT  06/08/2021   LEFT HEART CATH AND CORONARY ANGIOGRAPHY N/A 02/08/2021   Procedure: LEFT HEART CATH AND CORONARY ANGIOGRAPHY;  Surgeon: Runell Gess, MD;  Location: [facility];   Service: Cardiovascular;  Laterality: N/A;   PERIPHERAL VASCULAR ATHERECTOMY  04/27/2018   Procedure: PERIPHERAL VASCULAR ATHERECTOMY;  Surgeon: Runell Gess, MD;  Location[facility] INVASIVE CV LAB;  Service: Cardiovascular;;  right popliteal artery   PERIPHERAL VASCULAR CATHETERIZATION N/A 06/29/2015   Procedure: Lower Extremity Angiography;  Surgeon: Runell Gess, MD;  Location: [facility] INVASIVE CV LAB;  Service: Cardiovascular;  Laterality: N/A;   PERIPHERAL VASCULAR CATHETERIZATION N/A 07/13/2015   Procedure: Lower Extremity Angiography;  Surgeon: Runell Gess, MD;  Location: [facility] INVASIVE CV LAB;  Service: Cardiovascular;  Laterality: N/A;   PERIPHERAL VASCULAR CATHETERIZATION  07/13/2015   Procedure: Peripheral Vascular Atherectomy;  Surgeon: Runell Gess, MD;  Location: [facility];  Service: Cardiovascular;;   PERIPHERAL VASCULAR INTERVENTION  04/27/2018   Procedure: PERIPHERAL VASCULAR INTERVENTION;  Surgeon: Runell Gess, MD;  Location: [facility] INVAS[facility] CV LAB;  Service: Cardiovascular;;  right popliteal artery   POLYPECTOMY     TONSILLECTOMY     UPPER GASTROINTESTINAL ENDOSCOPY      Allergies: Penicillins  Medications: Prior to Admission medications   Medication Sig Start Date End Date Taking? Authorizing Provider  acetaminophen (TYLENOL) 500 MG tablet Take 1 tablet (500 mg total) by mouth every 6 (six) hours as needed. Patient taking differently: Take 500 mg by mouth every 6 (six) hours as needed for moderate pain. 03/27/22   Derwood Kaplan, MD  amiodarone (PACERONE) 200 MG tablet Take 1 tablet (200 mg total) by mouth daily. 01/28/23   Graciella Freer, [name]  apixaban (ELIQUIS) 5 MG TABS tablet Take 1 tablet (5 mg total) by mouth 2 (two) times daily. 01/08/23   Narda Bonds, MD  aspirin EC 81 MG tablet Take 1 tablet (81 mg total) by mouth daily. 06/01/15   Chilton Si, MD  cyanocobalamin (VITAMIN B12) 1000 MCG tablet Take 1,000 mcg by mouth daily.     [provider]  diltiazem (CARDIZEM) 30 MG tablet TAKE 1 TABLET (30 MG TOTAL) BY MOUTH 3 (THREE) TIMES DAILY AS NEEDED (PALPITATIONS). 01/28/23 01/28/24  Joylene Grapes, NP  empagliflozin (JARDIANCE) 10 MG TABS tablet Take 1 tablet (10 mg total) by mouth daily. 10/30/22   Joylene Grapes, NP  feeding supplement (ENSURE ENLIVE / ENSURE PLUS) LIQD Take 237 mLs by mouth 2 (two) times daily between meals. 04/11/23   Lorin Glass, MD  fenofibrate 160 MG tablet Take 1 tablet (160 mg total) by mouth daily. 03/22/22   Donita Brooks, MD  furosemide (LASIX) 40 MG tablet Take 1 tablet (40 mg total) by mouth 2 (two) times daily at 10 am and 4 pm. 04/11/23 05/11/23  Lorin Glass, MD  glucose blood (ONETOUCH ULTRA) test strip CHECK  BLOOD SUGAR TWICE A DAY 03/19/21   Donita Brooks, MD  Insulin Pen Needle (B-D UF III MINI PEN NEEDLES) 31G X 5 MM MISC Use daily with Victoza 05/31/22   Donita Brooks, MD  Iron, Ferrous Sulfate, 325 (65 Fe) MG TABS Take 325 mg by mouth daily. Patient taking differently: Take 325 mg by mouth in the morning and at bedtime. 01/22/23   Donita Brooks, MD  metoprolol tartrate (LOPRESSOR) 50 MG tablet Take 1 tablet (50 mg total) by mouth 2 (two) times daily. 04/11/23 05/11/23  Lorin Glass, MD  Omega-3 Fatty Acids (FISH OIL) 1000 MG CAPS Take 1,000 mg by mouth 2 (two) times daily.    [provider]  pantoprazole (PROTONIX) 40 MG tablet Take 1 tablet (40 mg total) by mouth 2 (two) times daily. 03/22/22   Donita Brooks, MD  potassium chloride SA (KLOR-CON M) 20 MEQ tablet Take 2 tablets (40 mEq total) by mouth daily. 04/12/23 05/12/23  Lorin Glass, MD  sitaGLIPtin (JANUVIA) 100 MG tablet Take 1 tablet (100 mg total) by mouth daily. 11/05/22   Park Meo, FNP  solifenacin (VESICARE) 10 MG tablet Take 1 tablet (10 mg total) by mouth daily. 02/27/23   Donita Brooks, MD  vedolizumab (ENTYVIO) 300 MG injection Inject 300 mg as directed See admin instructions. 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, building services  Tobacco Use   Smoking status: Former    Current packs/day: 0.00    Average packs/day: 1.5 packs/day for 25.0 years (37.5 ttl pk-yrs)    Types: Cigarettes    Start date: 08/13/1971    Quit date: 08/12/1996    Years since quitting: 26.6   Smokeless tobacco: Never   Tobacco comments:    Former smoker 09/17/2022  Vaping Use   Vaping status: Never Used  Substance and Sexual Activity   Alcohol use: Yes    Alcohol/week: 1.0 standard drink of alcohol    Types: 1 Cans of beer per week    Comment: social use   Drug use: No   Sexual activity: Yes  Other Topics Concern   Not on file  Social History Narrative   ** Merged History Encounter **       Social Determinants of Health   Financial Resource Strain: Low Risk  (01/23/2023)   Overall Financial Resource Strain (CARDIA)    Difficulty of Paying Living Expenses: Not hard at all  Food Insecurity: No Food Insecurity (04/16/2023)   Hunger Vital Sign    Worried About Running Out of Food in the Last Year: Never true    Ran Out of  Food in the Last Year: Never true  Transportation Needs: No Transportation Needs (04/16/2023)   PRAPARE - Administrator, Civil Service (Medical): No    Lack of Transportation (Non-Medical): No  Physical Activity: Insufficiently Active (01/23/2023)   Exercise Vital  Sign    Days of Exercise per Week: 3 days    Minutes of Exercise per Session: 30 min  Stress: No Stress Concern Present (01/23/2023)   [employer] of Occupational Health - Occupational Stress Questionnaire    Feeling of Stress : Only a little  Social Connections: Socially Integrated (01/23/2023)   Social Connection and Isolation Panel [NHANES]    Frequency of Communication with Friends and Family: More than three times a week    Frequency of Social Gatherings with Friends and Family: Three times a week    Attends Religious Services: More than 4 times per year    Active Member of Clubs or Organizations: Yes    Attends Banker Meetings: 1 to 4 times per year    Marital Status: Married     Vital Signs: There were no vitals taken for this visit.  No physical examination was performed in lieu of virtual telephone clinic visit.  Imaging: MR 03/22/21    06/08/21  Dose mapping   MR 12/19/21     Seg IV, 1.2 cm (TR5)    Seg IV, 0.7 cm (TR 4)    Seg II, 1.7 cm (TR5)   MR abdomen 12/27/22  Seg VI/VII - LIRADS TR-viable.  Significant capsular retraction and central tumor necrosis from prior Y90.  Peripheral enhancing nodular foci representative of viable tumor.    Seg II mass - 2.8 cm, LIRADS 5  Y-90 (left lobar, 03/21/23)  Multifocal hyperenhancing left lobe masses. 23.7 mCi administered.  SPECT 03/21/23   Labs:  CBC: Recent Labs    04/08/23 0410 04/09/23 0415 04/10/23 0419 04/11/23 0413  WBC 10.8* 10.1 9.5 12.3*  HGB 9.1* 9.5* 10.3* 10.9*  HCT 29.0* 30.2* 33.2* 34.2*  PLT 300 301 318 441*    COAGS: Recent Labs    03/07/23 0819 03/21/23 0820 04/08/23 0410 04/09/23 0415  INR 1.3* 1.2 1.5* 1.4*    BMP: Recent Labs    04/08/23 0410 04/09/23 0415 04/10/23 0419 04/11/23 0413  NA 132* 140 137 140  K 4.0 3.4* 3.2* 3.8  CL 101 102 100 99  CO2 23 25 25 30   GLUCOSE 109* 101* 107* 111*  BUN 25* 31* 39* 42*  CALCIUM 7.9* 8.3* 8.0* 8.5*   CREATININE 1.52* 1.42* 1.34* 1.47*  GFRNONAA 50* 54* 58* 52*    LIVER FUNCTION TESTS: Recent Labs    04/08/23 0410 04/09/23 0415 04/10/23 0419 04/11/23 0413  BILITOT 3.8* 3.6* 3.0* 2.9*  AST 107* 69* 61* 66*  ALT 26 25 23 26   ALKPHOS 187* 185* 197* 219*  PROT 5.8* 5.8* 5.9* 6.6  ALBUMIN 1.8* 1.7* 1.8* 1.9*       Assessment and Plan: 67 year old male with biopsy proven large right hepatocellular carcinoma status post transarterial radioembolization with split dose segmentectomy on 06/21/21, noted to have new multifocal bilobar small masses compatible with HCC on follow up imaging, status post multiple rounds of chemo and immunotherapy stopped due to poor tolerance.  Most recent imaging demonstrating new, progressive multifocal, bilobar HCC with increasing a-FP.  He is now status post left lobar transarterial radioembolization on 03/21/23.  He tolerated this well, however after approximately 10 days had progressively worsening symptoms of fatigue,  nasal sores, dry throat, and weakness which prompted admission during which he was treated for CHF and atrial fibrillation.  He was discharged home on 8/30, traveled out of town, and suffered a fall with some lingering soreness and a significant arm laceration.  He will return to [place] this weekend.  He is hopeful to proceed with right sided radioembolization next Thursday, 9/12 at [facility].  We will plan to draw labs that morning including a-FP to monitor treatment response.   Electronically Signed: Bennie Dallas 04/18/2023, 8:57 AM   I spent a total of 25 Minutes in virtual telephone clinical consultation, greater than 50% of which was counseling/coordinating care for hepatocellular carcinoma.

## 2023-04-21 ENCOUNTER — Ambulatory Visit (INDEPENDENT_AMBULATORY_CARE_PROVIDER_SITE_OTHER): Payer: PPO | Admitting: Family Medicine

## 2023-04-21 VITALS — BP 118/66 | HR 79 | Temp 98.2°F | Ht 70.0 in | Wt 215.8 lb

## 2023-04-21 DIAGNOSIS — I5021 Acute systolic (congestive) heart failure: Secondary | ICD-10-CM | POA: Diagnosis not present

## 2023-04-21 MED ORDER — SPIRONOLACTONE 25 MG PO TABS: 25.0000 mg | ORAL_TABLET | Freq: Every day | ORAL | 3 refills | Status: DC

## 2023-04-21 NOTE — Progress Notes (Signed)
Subjective:    Patient ID: Matthew Chen, male    DOB: 1955/11/16, 67 y.o.   MRN: [id number]  Patient read about his congestive heart failure and fluid overload.  He is currently on Lasix 40 mg twice daily.  Patient also had an episode of atrial fibrillation.  In the hospital they discontinued his pioglitazone due to the fluid retention.  I also switched his carvedilol to metoprolol.  Here to normal sinus rhythm but he has +2 edema from his ankles to his knees despite taking Lasix 40 mg twice daily.  Recently suffered a fall and suffered an abrasion to the dorsum of his right forearm.  He has black eschar covering superficial skin tears over the dorsal surface of his forearm.  There is no evidence of cellulitis.  I believe the black eschar is due to the skin tear he suffered from the fall losing his vascular supply.  He denies any chest pain or shortness of breath.  Recently had an echocardiogram in the hospital that showed an ejection fraction of 45% down from 65% a year ago. Past Medical History:  Diagnosis Date   Adenomatous colon polyp 03/2008   Anginal pain (HCC)    Aortic aneurysm, thoracic (HCC) 09/09/2016   4.4 cm by echo 05/2015   Arthritis    "joints in my fingers ache" (07/13/2015)   Bicuspid aortic valve 09/09/2016   Cataract 2019   bilateral eyes   CKD (chronic kidney disease), stage III (HCC)    Coronary artery disease    Diabetes mellitus without complication (HCC)    Hemorrhoids    Hepatocellular carcinoma (HCC)    Hyperlipidemia    Hypertension    Paroxysmal atrial fibrillation (HCC)    Peripheral arterial disease (HCC)    a. dopppler 05/29/2015 revealed high-grade right popliteal and distal left SFA disease b. LE angio 06/28/2015 revealing high grade calcific dx with distal L SFA and R popliteal artery s/p diamondback orbital rotational atherectomy, PTA of L SFA  c. 07/13/2015 diamondback orbital rotational atherectomy and drug eluting balloon angioplasty of R popliteal  artery (P2 segment) using distal protection    Type II diabetes mellitus (HCC)    Ulcerative colitis    Past Surgical History:  Procedure Laterality Date   ABDOMINAL AORTOGRAM W/LOWER EXTREMITY N/A 04/27/2018   Procedure: ABDOMINAL AORTOGRAM W/LOWER EXTREMITY;  Surgeon: Runell Gess, MD;  Location: [facility];  Service: Cardiovascular;  Laterality: N/A;   BIOPSY  01/03/2023   Procedure: BIOPSY;  Surgeon: Benancio Deeds, MD;  Location: [facility] ENDOSCOPY;  Service: Gastroenterology;;   COLONOSCOPY     COLONOSCOPY W/ POLYPECTOMY  X 4-5   CORONARY ATHERECTOMY N/A 02/15/2021   Procedure: CORONARY ATHERECTOMY;  Surgeon: Runell Gess, MD;  Location: [facility];  Service: Cardiovascular;  Laterality: N/A;  aborted    CORONARY BALLOON ANGIOPLASTY N/A 02/15/2021   Procedure: CORONARY BALLOON ANGIOPLASTY;  Surgeon: Runell Gess, MD;  Location: [facility];  Service: Cardiovascular;  Laterality: N/A;   COSMETIC SURGERY  < 2000   "removed birthmark from top of my head"   FLEXIBLE SIGMOIDOSCOPY N/A 01/03/2023   Procedure: FLEXIBLE SIGMOIDOSCOPY;  Surgeon: Benancio Deeds, MD;  Location: [facility] ENDOSCOPY;  Service: Gastroenterology;  Laterality: N/A;   IR ANGIOGRAM SELECTIVE EACH ADDITIONAL VESSEL  06/08/2021   IR ANGIOGRAM SELECTIVE EACH ADDITIONAL VESSEL  06/08/2021   IR ANGIOGRAM SELECTIVE EACH ADDITIONAL VESSEL  06/08/2021   IR ANGIOGRAM SELECTIVE EACH ADDITIONAL VESSEL  06/08/2021   IR ANGIOGRAM SELECTIVE EACH ADDITIONAL VESSEL  06/21/2021   IR ANGIOGRAM SELECTIVE EACH ADDITIONAL VESSEL  06/21/2021   IR ANGIOGRAM SELECTIVE EACH ADDITIONAL VESSEL  06/21/2021   IR ANGIOGRAM SELECTIVE EACH ADDITIONAL VESSEL  06/21/2021   IR ANGIOGRAM SELECTIVE EACH ADDITIONAL VESSEL  03/07/2023   IR ANGIOGRAM SELECTIVE EACH ADDITIONAL VESSEL  03/07/2023   IR ANGIOGRAM SELECTIVE EACH ADDITIONAL VESSEL  03/21/2023   IR ANGIOGRAM SELECTIVE EACH ADDITIONAL VESSEL  03/21/2023   IR  ANGIOGRAM VISCERAL SELECTIVE  06/08/2021   IR ANGIOGRAM VISCERAL SELECTIVE  06/21/2021   IR ANGIOGRAM VISCERAL SELECTIVE  03/07/2023   IR ANGIOGRAM VISCERAL SELECTIVE  03/21/2023   IR EMBO ARTERIAL NOT HEMORR HEMANG INC GUIDE ROADMAPPING  06/08/2021   IR EMBO TUMOR ORGAN ISCHEMIA INFARCT INC GUIDE ROADMAPPING  06/21/2021   IR EMBO TUMOR ORGAN ISCHEMIA INFARCT INC GUIDE ROADMAPPING  06/21/2021   IR EMBO TUMOR ORGAN ISCHEMIA INFARCT INC GUIDE ROADMAPPING  03/21/2023   IR RADIOLOGIST EVAL & MGMT  05/08/2021   IR RADIOLOGIST EVAL & MGMT  07/26/2021   IR RADIOLOGIST EVAL & MGMT  09/05/2021   IR RADIOLOGIST EVAL & MGMT  12/27/2021   IR RADIOLOGIST EVAL & MGMT  02/05/2023   IR RADIOLOGIST EVAL & MGMT  04/18/2023   IR US GUIDE VASC ACCESS LEFT  06/21/2021   IR US GUIDE VASC ACCESS LEFT  03/07/2023   IR US GUIDE VASC ACCESS LEFT  03/07/2023   IR US GUIDE VASC ACCESS LEFT  03/07/2023   IR US GUIDE VASC ACCESS LEFT  03/21/2023   IR US GUIDE VASC ACCESS RIGHT  06/08/2021   LEFT HEART CATH AND CORONARY ANGIOGRAPHY N/A 02/08/2021   Procedure: LEFT HEART CATH AND CORONARY ANGIOGRAPHY;  Surgeon: Runell Gess, MD;  Location: [facility];  Service: Cardiovascular;  Laterality: N/A;   PERIPHERAL VASCULAR ATHERECTOMY  04/27/2018   Procedure: PERIPHERAL VASCULAR ATHERECTOMY;  Surgeon: Runell Gess, MD;  [facility] IN[facility] CV [facility];  Service: Cardiovascular;;  right popliteal artery   PERIPHERAL VASCULAR CATHETERIZATION N/A 06/29/2015   Procedure: Lower Extremity Angiography;  Surgeon: Runell Gess, MD;  Location: [facility] INVASIVE CV LAB;  Service: Cardiovascular;  Laterality: N/A;   PERIPHERAL VASCULAR CATHETERIZATION N/A 07/13/2015   Procedure: Lower Extremity Angiography;  Surgeon: Runell Gess, MD;  Location: [facility] INVASIVE CV LAB;  Service: Cardiovascular;  Laterality: N/A;   PERIPHERAL VASCULAR CATHETERIZATION  07/13/2015   Procedure: Peripheral Vascular Atherectomy;  Surgeon: Runell Gess, MD;   Location: [facility];  Service: Cardiovascular;;   PERIPHERAL VASCULAR INTERVENTION  04/27/2018   Procedure: PERIPHERAL VASCULAR INTERVENTION;  Surgeon: Runell Gess, MD;  Location: [facility];  Service: Cardiovascular;;  right popliteal artery   POLYPECTOMY     TONSILLECTOMY     UPPER GASTROINTESTINAL ENDOSCOPY     Current Outpatient Medications on File Prior to Visit  Medication Sig Dispense Refill   acetaminophen (TYLENOL) 500 MG tablet Take 1 tablet (500 mg total) by mouth every 6 (six) hours as needed. (Patient taking differently: Take 500 mg by mouth every 6 (six) hours as needed for moderate pain.) 30 tablet 0   amiodarone (PACERONE) 200 MG tablet Take 1 tablet (200 mg total) by mouth daily. 90 tablet 3   aspirin EC 81 MG tablet Take 1 tablet (81 mg total) by mouth daily. 90 tablet 3   cyanocobalamin (VITAMIN B12) 1000 MCG tablet Take 1,000 mcg by mouth daily.  diltiazem (CARDIZEM) 30 MG tablet TAKE 1 TABLET (30 MG TOTAL) BY MOUTH 3 (THREE) TIMES DAILY AS NEEDED (PALPITATIONS). 270 tablet 1   empagliflozin (JARDIANCE) 10 MG TABS tablet Take 1 tablet (10 mg total) by mouth daily. 90 tablet 3   feeding supplement (ENSURE ENLIVE / ENSURE PLUS) LIQD Take 237 mLs by mouth 2 (two) times daily between meals. 237 mL 12   fenofibrate 160 MG tablet Take 1 tablet (160 mg total) by mouth daily. 90 tablet 3   furosemide (LASIX) 40 MG tablet Take 1 tablet (40 mg total) by mouth 2 (two) times daily at 10 am and 4 pm. 30 tablet 0   glucose blood (ONETOUCH ULTRA) test strip CHECK BLOOD SUGAR TWICE A DAY 50 strip 15   Insulin Pen Needle (B-D UF III MINI PEN NEEDLES) 31G X 5 MM MISC Use daily with Victoza 100 each 11   Iron, Ferrous Sulfate, 325 (65 Fe) MG TABS Take 325 mg by mouth daily. (Patient taking differently: Take 325 mg by mouth in the morning and at bedtime.) 30 tablet 3   metoprolol tartrate (LOPRESSOR) 50 MG tablet Take 1 tablet (50 mg total) by mouth 2 (two) times daily.  60 tablet 0   Omega-3 Fatty Acids (FISH OIL) 1000 MG CAPS Take 1,000 mg by mouth 2 (two) times daily.     pantoprazole (PROTONIX) 40 MG tablet Take 1 tablet (40 mg total) by mouth 2 (two) times daily. 60 tablet 11   potassium chloride SA (KLOR-CON M) 20 MEQ tablet Take 2 tablets (40 mEq total) by mouth daily. 30 tablet 0   sitaGLIPtin (JANUVIA) 100 MG tablet Take 1 tablet (100 mg total) by mouth daily. 90 tablet 3   solifenacin (VESICARE) 10 MG tablet Take 1 tablet (10 mg total) by mouth daily. 30 tablet 11   vedolizumab (ENTYVIO) 300 MG injection Inject 300 mg as directed See admin instructions. Every 2 months     apixaban (ELIQUIS) 5 MG TABS tablet Take 1 tablet (5 mg total) by mouth 2 (two) times daily. (Patient not taking: Reported on 04/21/2023)     No current facility-administered medications on file prior to visit.   Allergies  Allergen Reactions   Penicillins Other (See Comments)    Unsure, childhood reaction Has patient had a PCN reaction causing immediate rash, facial/tongue/throat swelling, SOB or lightheadedness with hypotension: Unknown Has patient had a PCN reaction causing severe rash involving mucus membranes or skin necrosis: Unknown Has patient had a PCN reaction that required hospitalization: No Has patient had a PCN reaction occurring within the last 10 years: No If all of the above answers are "NO", then may proceed with Cephalosporin use.    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, building services  Tobacco Use   Smoking status: Former    Current packs/day: 0.00    Average packs/day: 1.5 packs/day for 25.0 years (37.5 ttl pk-yrs)    Types: Cigarettes    Start date: 08/13/1971    Quit date: 08/12/1996    Years since quitting: 26.7   Smokeless  tobacco: Never   Tobacco comments:    Former smoker 09/17/2022  Vaping Use   Vaping status: Never Used  Substance and Sexual Activity  Alcohol use: Yes    Alcohol/week: 1.0 standard drink of alcohol    Types: 1 Cans of beer per week    Comment: social use   Drug use: No   Sexual activity: Yes  Other Topics Concern   Not on file  Social History Narrative   ** Merged History Encounter **       Social Determinants of Health   Financial Resource Strain: Low Risk  (01/23/2023)   Overall Financial Resource Strain (CARDIA)    Difficulty of Paying Living Expenses: Not hard at all  Food Insecurity: No Food Insecurity (04/16/2023)   Hunger Vital Sign    Worried About Running Out of Food in the Last Year: Never true    Ran Out of Food in the Last Year: Never true  Transportation Needs: No Transportation Needs (04/16/2023)   PRAPARE - Administrator, Civil Service (Medical): No    Lack of Transportation (Non-Medical): No  Physical Activity: Insufficiently Active (01/23/2023)   Exercise Vital Sign    Days of Exercise per Week: 3 days    Minutes of Exercise per Session: 30 min  Stress: No Stress Concern Present (01/23/2023)   [employer] of Occupational Health - Occupational Stress Questionnaire    Feeling of Stress : Only a little  Social Connections: Socially Integrated (01/23/2023)   Social Connection and Isolation Panel [NHANES]    Frequency of Communication with Friends and Family: More than three times a week    Frequency of Social Gatherings with Friends and Family: Three times a week    Attends Religious Services: More than 4 times per year    Active Member of Clubs or Organizations: Yes    Attends Banker Meetings: 1 to 4 times per year    Marital Status: Married  Catering manager Violence: Not At Risk (04/07/2023)   Humiliation, Afraid, Rape, and Kick questionnaire    Fear of Current or Ex-Partner: No    Emotionally Abused: No    Physically Abused:  No    Sexually Abused: No      Review of Systems  All other systems reviewed and are negative.      Objective:   Physical Exam Vitals reviewed.  Constitutional:      General: He is not in acute distress.    Appearance: He is well-developed. He is not ill-appearing, toxic-appearing or diaphoretic.  Neck:     Thyroid: No thyromegaly.     Vascular: No JVD.  Cardiovascular:     Rate and Rhythm: Normal rate. Rhythm irregular.     Heart sounds: Murmur heard.  Pulmonary:     Effort: Pulmonary effort is normal. No respiratory distress.     Breath sounds: Normal breath sounds. No wheezing or rales.  Abdominal:     General: Bowel sounds are normal. There is no distension.     Palpations: Abdomen is soft.     Tenderness: There is no abdominal tenderness. There is no guarding or rebound.  Musculoskeletal:     Right forearm: Laceration present. No tenderness or bony tenderness.       Arms:     Right lower leg: Edema present.     Left lower leg: Edema present.  Skin:    General: Skin is warm.     Coloration: Skin is not pale.     Findings: Lesion present. No rash.  Neurological:     Mental Status: He is alert.  Assessment & Plan:  Acute systolic congestive heart failure (HCC) - Plan: COMPLETE METABOLIC PANEL WITH GFR Patient will continue Lasix 40 mg twice daily.  Given his new onset congestive heart failure we will add spironolactone 25 mg daily and recheck a CMP.  I do not believe his blood pressure will tolerate an angiotensin receptor blocker or Entresto.  I will recheck the patient next week.  Hopefully the combination of diuretics will improve his fluid status.  I believe that the fluid retention is due to a combination of liver dysfunction coupled with congestive heart failure.  I agree that holding Actos is in his best interest.  Recommended covering the skin tears with Polysporin, clean gauze, and an Ace bandage daily to prevent secondary infection.  Recheck in  1 week

## 2023-04-22 LAB — COMPLETE METABOLIC PANEL WITH GFR
AG Ratio: 0.7 (calc) — ABNORMAL LOW (ref 1.0–2.5)
ALT: 21 U/L (ref 9–46)
AST: 53 U/L — ABNORMAL HIGH (ref 10–35)
Albumin: 2.4 g/dL — ABNORMAL LOW (ref 3.6–5.1)
Alkaline phosphatase (APISO): 226 U/L — ABNORMAL HIGH (ref 35–144)
BUN: 25 mg/dL (ref 7–25)
CO2: 26 mmol/L (ref 20–32)
Calcium: 8.3 mg/dL — ABNORMAL LOW (ref 8.6–10.3)
Chloride: 103 mmol/L (ref 98–110)
Creat: 1.14 mg/dL (ref 0.70–1.35)
Globulin: 3.3 g/dL (ref 1.9–3.7)
Glucose, Bld: 120 mg/dL — ABNORMAL HIGH (ref 65–99)
Potassium: 4.2 mmol/L (ref 3.5–5.3)
Sodium: 139 mmol/L (ref 135–146)
Total Bilirubin: 3.1 mg/dL — ABNORMAL HIGH (ref 0.2–1.2)
Total Protein: 5.7 g/dL — ABNORMAL LOW (ref 6.1–8.1)
eGFR: 70 mL/min/{1.73_m2} (ref >=60)

## 2023-04-24 ENCOUNTER — Ambulatory Visit (HOSPITAL_COMMUNITY): Payer: PPO

## 2023-04-24 ENCOUNTER — Other Ambulatory Visit (HOSPITAL_COMMUNITY): Payer: PPO

## 2023-04-28 ENCOUNTER — Ambulatory Visit (INDEPENDENT_AMBULATORY_CARE_PROVIDER_SITE_OTHER): Payer: PPO | Admitting: Family Medicine

## 2023-04-28 VITALS — BP 120/72 | HR 74 | Temp 97.7°F | Ht 70.0 in | Wt 204.0 lb

## 2023-04-28 DIAGNOSIS — I5021 Acute systolic (congestive) heart failure: Secondary | ICD-10-CM | POA: Diagnosis not present

## 2023-04-28 LAB — BASIC METABOLIC PANEL WITH GFR
BUN: 21 mg/dL (ref 7–25)
CO2: 27 mmol/L (ref 20–32)
Calcium: 8.4 mg/dL — ABNORMAL LOW (ref 8.6–10.3)
Chloride: 103 mmol/L (ref 98–110)
Creat: 1.07 mg/dL (ref 0.70–1.35)
Glucose, Bld: 73 mg/dL (ref 65–99)
Potassium: 4.3 mmol/L (ref 3.5–5.3)
Sodium: 138 mmol/L (ref 135–146)
eGFR: 76 mL/min/{1.73_m2} (ref >=60)

## 2023-04-28 NOTE — Progress Notes (Signed)
Subjective:    Patient ID: Matthew Chen, male    DOB: 24-Aug-1955, 67 y.o.   MRN: [id number] 04/21/23 Patient recently admitted due to congestive heart failure and fluid overload.  He is currently on Lasix 40 mg twice daily.  Patient also had an episode of atrial fibrillation.  In the hospital they discontinued his pioglitazone due to the fluid retention.  I also switched his carvedilol to metoprolol.  Here to normal sinus rhythm but he has +2 edema from his ankles to his knees despite taking Lasix 40 mg twice daily.  Recently suffered a fall and suffered an abrasion to the dorsum of his right forearm.  He has black eschar covering superficial skin tears over the dorsal surface of his forearm.  There is no evidence of cellulitis.  I believe the black eschar is due to the skin tear he suffered from the fall losing his vascular supply.  He denies any chest pain or shortness of breath.  Recently had an echocardiogram in the hospital that showed an ejection fraction of 45% down from 65% a year ago.  At that time, my plan was: Patient will continue Lasix 40 mg twice daily.  Given his new onset congestive heart failure we will add spironolactone 25 mg daily and recheck a CMP.  I do not believe his blood pressure will tolerate an angiotensin receptor blocker or Entresto.  I will recheck the patient next week.  Hopefully the combination of diuretics will improve his fluid status.  I believe that the fluid retention is due to a combination of liver dysfunction coupled with congestive heart failure.  I agree that holding Actos is in his best interest.  Recommended covering the skin tears with Polysporin, clean gauze, and an Ace bandage daily to prevent secondary infection.  Recheck in 1 week  04/28/23 Wt Readings from Last 3 Encounters:  04/28/23 204 lb (92.5 kg)  04/21/23 215 lb 12.8 oz (97.9 kg)  04/11/23 195 lb 12.8 oz (88.8 kg)   After we added spironolactone to his Lasix, the patient has diuresed and lost 11  pounds in the last week.  He has still +1 edema in his legs but it is much better than it was last week.  He denies any shortness of breath.  He does not appear fluid overloaded.  Past Medical History:  Diagnosis Date   Adenomatous colon polyp 03/2008   Anginal pain (HCC)    Aortic aneurysm, thoracic (HCC) 09/09/2016   4.4 cm by echo 05/2015   Arthritis    "joints in my fingers ache" (07/13/2015)   Bicuspid aortic valve 09/09/2016   Cataract 2019   bilateral eyes   CKD (chronic kidney disease), stage III (HCC)    Coronary artery disease    Diabetes mellitus without complication (HCC)    Hemorrhoids    Hepatocellular carcinoma (HCC)    Hyperlipidemia    Hypertension    Paroxysmal atrial fibrillation (HCC)    Peripheral arterial disease (HCC)    a. dopppler 05/29/2015 revealed high-grade right popliteal and distal left SFA disease b. LE angio 06/28/2015 revealing high grade calcific dx with distal L SFA and R popliteal artery s/p diamondback orbital rotational atherectomy, PTA of L SFA  c. 07/13/2015 diamondback orbital rotational atherectomy and drug eluting balloon angioplasty of R popliteal artery (P2 segment) using distal protection    Type II diabetes mellitus (HCC)    Ulcerative colitis    Past Surgical History:  Procedure Laterality Date   ABDOMINAL  AORTOGRAM W/LOWER EXTREMITY N/A 04/27/2018   Procedure: ABDOMINAL AORTOGRAM W/LOWER EXTREMITY;  Surgeon: Runell Gess, MD;  Location: [facility] INVASIVE CV LAB;  Service: Cardiovascular;  Laterality: N/A;   BIOPSY  01/03/2023   Procedure: BIOPSY;  Surgeon: Benancio Deeds, MD;  Location: [facility] ENDOSCOPY;  Service: Gastroenterology;;   COLONOSCOPY     COLONOSCOPY W/ POLYPECTOMY  X 4-5   CORONARY ATHERECTOMY N/A 02/15/2021   Procedure: CORONARY ATHERECTOMY;  Surgeon: Runell Gess, MD;  Location: [facility] INVASIVE CV LAB;  Service: Cardiovascular;  Laterality: N/A;  aborted    CORONARY BALLOON ANGIOPLASTY N/A 02/15/2021   Procedure:  CORONARY BALLOON ANGIOPLASTY;  Surgeon: Runell Gess, MD;  Location: [facility] INVASIVE CV LAB;  Service: Cardiovascular;  Laterality: N/A;   COSMETIC SURGERY  < 2000   "removed birthmark from top of my head"   FLEXIBLE SIGMOIDOSCOPY N/A 01/03/2023   Procedure: FLEXIBLE SIGMOIDOSCOPY;  Surgeon: Benancio Deeds, MD;  Location: [facility] ENDOSCOPY;  Service: Gastroenterology;  Laterality: N/A;   IR ANGIOGRAM SELECTIVE EACH ADDITIONAL VESSEL  06/08/2021   IR ANGIOGRAM SELECTIVE EACH ADDITIONAL VESSEL  06/08/2021   IR ANGIOGRAM SELECTIVE EACH ADDITIONAL VESSEL  06/08/2021   IR ANGIOGRAM SELECTIVE EACH ADDITIONAL VESSEL  06/08/2021   IR ANGIOGRAM SELECTIVE EACH ADDITIONAL VESSEL  06/21/2021   IR ANGIOGRAM SELECTIVE EACH ADDITIONAL VESSEL  06/21/2021   IR ANGIOGRAM SELECTIVE EACH ADDITIONAL VESSEL  06/21/2021   IR ANGIOGRAM SELECTIVE EACH ADDITIONAL VESSEL  06/21/2021   IR ANGIOGRAM SELECTIVE EACH ADDITIONAL VESSEL  03/07/2023   IR ANGIOGRAM SELECTIVE EACH ADDITIONAL VESSEL  03/07/2023   IR ANGIOGRAM SELECTIVE EACH ADDITIONAL VESSEL  03/21/2023   IR ANGIOGRAM SELECTIVE EACH ADDITIONAL VESSEL  03/21/2023   IR ANGIOGRAM VISCERAL SELECTIVE  06/08/2021   IR ANGIOGRAM VISCERAL SELECTIVE  06/21/2021   IR ANGIOGRAM VISCERAL SELECTIVE  03/07/2023   IR ANGIOGRAM VISCERAL SELECTIVE  03/21/2023   IR EMBO ARTERIAL NOT HEMORR HEMANG INC GUIDE ROADMAPPING  06/08/2021   IR EMBO TUMOR ORGAN ISCHEMIA INFARCT INC GUIDE ROADMAPPING  06/21/2021   IR EMBO TUMOR ORGAN ISCHEMIA INFARCT INC GUIDE ROADMAPPING  06/21/2021   IR EMBO TUMOR ORGAN ISCHEMIA INFARCT INC GUIDE ROADMAPPING  03/21/2023   IR RADIOLOGIST EVAL & MGMT  05/08/2021   IR RADIOLOGIST EVAL & MGMT  07/26/2021   IR RADIOLOGIST EVAL & MGMT  09/05/2021   IR RADIOLOGIST EVAL & MGMT  12/27/2021   IR RADIOLOGIST EVAL & MGMT  02/05/2023   IR RADIOLOGIST EVAL & MGMT  04/18/2023   IR US GUIDE VASC ACCESS LEFT  06/21/2021   IR US GUIDE VASC ACCESS LEFT  03/07/2023   IR US GUIDE  VASC ACCESS LEFT  03/07/2023   IR US GUIDE VASC ACCESS LEFT  03/07/2023   IR US GUIDE VASC ACCESS LEFT  03/21/2023   IR US GUIDE VASC ACCESS RIGHT  06/08/2021   LEFT HEART CATH AND CORONARY ANGIOGRAPHY N/A 02/08/2021   Procedure: LEFT HEART CATH AND CORONARY ANGIOGRAPHY;  Surgeon: Runell Gess, MD;  Location: [facility] INVASIVE CV LAB;  Service: Cardiovascular;  Laterality: N/A;   PERIPHERAL VASCULAR ATHERECTOMY  04/27/2018   Procedure: PERIPHERAL VASCULAR ATHERECTOMY;  Surgeon: Runell Gess, MD;  Location: [facility] INVASIVE CV LAB;  Service: Cardiovascular;;  right popliteal artery   PERIPHERAL VASCULAR CATHETERIZATION N/A 06/29/2015   Procedure: Lower Extremity Angiography;  Surgeon: Runell Gess, MD;  Location: [facility] INVASIVE CV LAB;  Service: Cardiovascular;  Laterality: N/A;   PERIPHERAL VASCULAR CATHETERIZATION N/A 07/13/2015  Procedure: Lower Extremity Angiography;  Surgeon: Runell Gess, MD;  Location: [facility] INVASIVE CV LAB;  Service: Cardiovascular;  Laterality: N/A;   PERIPHERAL VASCULAR CATHETERIZATION  07/13/2015   Procedure: Peripheral Vascular Atherectomy;  Surgeon: Runell Gess, MD;  Location: [facility] INVASIVE CV LAB;  Service: Cardiovascular;;   PERIPHERAL VASCULAR INTERVENTION  04/27/2018   Procedure: PERIPHERAL VASCULAR INTERVENTION;  Surgeon: Runell Gess, MD;  Location: [facility] INVASIVE CV LAB;  Service: Cardiovascular;;  right popliteal artery   POLYPECTOMY     TONSILLECTOMY     UPPER GASTROINTESTINAL ENDOSCOPY     Current Outpatient Medications on File Prior to Visit  Medication Sig Dispense Refill   acetaminophen (TYLENOL) 500 MG tablet Take 1 tablet (500 mg total) by mouth every 6 (six) hours as needed. (Patient taking differently: Take 500 mg by mouth every 6 (six) hours as needed for moderate pain.) 30 tablet 0   amiodarone (PACERONE) 200 MG tablet Take 1 tablet (200 mg total) by mouth daily. 90 tablet 3   apixaban (ELIQUIS) 5 MG TABS tablet Take 1 tablet (5 mg total) by  mouth 2 (two) times daily. (Patient not taking: Reported on 04/21/2023)     aspirin EC 81 MG tablet Take 1 tablet (81 mg total) by mouth daily. 90 tablet 3   cyanocobalamin (VITAMIN B12) 1000 MCG tablet Take 1,000 mcg by mouth daily.     diltiazem (CARDIZEM) 30 MG tablet TAKE 1 TABLET (30 MG TOTAL) BY MOUTH 3 (THREE) TIMES DAILY AS NEEDED (PALPITATIONS). 270 tablet 1   empagliflozin (JARDIANCE) 10 MG TABS tablet Take 1 tablet (10 mg total) by mouth daily. 90 tablet 3   feeding supplement (ENSURE ENLIVE / ENSURE PLUS) LIQD Take 237 mLs by mouth 2 (two) times daily between meals. 237 mL 12   fenofibrate 160 MG tablet Take 1 tablet (160 mg total) by mouth daily. 90 tablet 3   furosemide (LASIX) 40 MG tablet Take 1 tablet (40 mg total) by mouth 2 (two) times daily at 10 am and 4 pm. 30 tablet 0   glucose blood (ONETOUCH ULTRA) test strip CHECK BLOOD SUGAR TWICE A DAY 50 strip 15   Insulin Pen Needle (B-D UF III MINI PEN NEEDLES) 31G X 5 MM MISC Use daily with Victoza 100 each 11   Iron, Ferrous Sulfate, 325 (65 Fe) MG TABS Take 325 mg by mouth daily. (Patient taking differently: Take 325 mg by mouth in the morning and at bedtime.) 30 tablet 3   metoprolol tartrate (LOPRESSOR) 50 MG tablet Take 1 tablet (50 mg total) by mouth 2 (two) times daily. 60 tablet 0   Omega-3 Fatty Acids (FISH OIL) 1000 MG CAPS Take 1,000 mg by mouth 2 (two) times daily.     pantoprazole (PROTONIX) 40 MG tablet Take 1 tablet (40 mg total) by mouth 2 (two) times daily. 60 tablet 11   potassium chloride SA (KLOR-CON M) 20 MEQ tablet Take 2 tablets (40 mEq total) by mouth daily. 30 tablet 0   sitaGLIPtin (JANUVIA) 100 MG tablet Take 1 tablet (100 mg total) by mouth daily. 90 tablet 3   solifenacin (VESICARE) 10 MG tablet Take 1 tablet (10 mg total) by mouth daily. 30 tablet 11   spironolactone (ALDACTONE) 25 MG tablet Take 1 tablet (25 mg total) by mouth daily. 90 tablet 3   vedolizumab (ENTYVIO) 300 MG injection Inject 300 mg as  directed See admin instructions. Every 2 months     No current facility-administered medications on file  prior to visit.   Allergies  Allergen Reactions   Penicillins Other (See Comments)    Unsure, childhood reaction Has patient had a PCN reaction causing immediate rash, facial/tongue/throat swelling, SOB or lightheadedness with hypotension: Unknown Has patient had a PCN reaction causing severe rash involving mucus membranes or skin necrosis: Unknown Has patient had a PCN reaction that required hospitalization: No Has patient had a PCN reaction occurring within the last 10 years: No If all of the above answers are "NO", then may proceed with Cephalosporin use.    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, building services  Tobacco Use   Smoking status: Former    Current packs/day: 0.00    Average packs/day: 1.5 packs/day for 25.0 years (37.5 ttl pk-yrs)    Types: Cigarettes    Start date: 08/13/1971    Quit date: 08/12/1996    Years since quitting: 26.7   Smokeless tobacco: Never   Tobacco comments:    Former smoker 09/17/2022  Vaping Use   Vaping status: Never Used  Substance and Sexual Activity   Alcohol use: Yes    Alcohol/week: 1.0 standard drink of alcohol    Types: 1 Cans of beer per week    Comment: social use   Drug use: No   Sexual activity: Yes  Other Topics Concern   Not on file  Social History Narrative   ** Merged History Encounter **       Social Determinants of Health   Financial Resource Strain: Low Risk  (01/23/2023)   Overall Financial Resource Strain (CARDIA)    Difficulty of Paying Living Expenses: Not hard at all  Food Insecurity: No Food Insecurity (04/16/2023)   Hunger Vital Sign    Worried About Running Out of Food in the Last  Year: Never true    Ran Out of Food in the Last Year: Never true  Transportation Needs: No Transportation Needs (04/16/2023)   PRAPARE - Administrator, Civil Service (Medical): No    Lack of Transportation (Non-Medical): No  Physical Activity: Insufficiently Active (01/23/2023)   Exercise Vital Sign    Days of Exercise per Week: 3 days    Minutes of Exercise per Session: 30 min  Stress: No Stress Concern Present (01/23/2023)   [employer] of Occupational Health - Occupational Stress Questionnaire    Feeling of Stress : Only a little  Social Connections: Socially Integrated (01/23/2023)   Social Connection and Isolation Panel [NHANES]    Frequency of Communication with Friends and Family: More than three times a week    Frequency of Social Gatherings with Friends and Family: Three times a week    Attends Religious Services: More than 4 times per year    Active Member of Clubs or Organizations: Yes    Attends Banker Meetings: 1 to 4 times per year    Marital Status: Married  Catering manager Violence: Not At Risk (04/07/2023)   Humiliation, Afraid, Rape, and Kick questionnaire    Fear of Current or Ex-Partner: No    Emotionally Abused: No    Physically Abused: No    Sexually Abused: No      Review of Systems  All other systems reviewed and  are negative.      Objective:   Physical Exam Vitals reviewed.  Constitutional:      General: He is not in acute distress.    Appearance: He is well-developed. He is not ill-appearing, toxic-appearing or diaphoretic.  Neck:     Thyroid: No thyromegaly.     Vascular: No JVD.  Cardiovascular:     Rate and Rhythm: Normal rate. Rhythm irregular.     Heart sounds: Murmur heard.  Pulmonary:     Effort: Pulmonary effort is normal. No respiratory distress.     Breath sounds: Normal breath sounds. No wheezing or rales.  Abdominal:     General: Bowel sounds are normal. There is no distension.     Palpations:  Abdomen is soft.     Tenderness: There is no abdominal tenderness. There is no guarding or rebound.  Musculoskeletal:     Right forearm: Laceration present. No tenderness or bony tenderness.       Arms:     Right lower leg: Edema present.     Left lower leg: Edema present.  Skin:    General: Skin is warm.     Coloration: Skin is not pale.     Findings: Lesion present. No rash.  Neurological:     Mental Status: He is alert.           Assessment & Plan:  Acute systolic congestive heart failure (HCC) - Plan: BASIC METABOLIC PANEL WITH GFR I believe 200 pounds with the patient's appropriate dry weight.  Therefore I recommended he decrease Lasix to 40 mg a day and continue spironolactone 25 mg a day.  I asked that he monitor his weight daily and notify me if he gains or loses more than 3 pounds.  We will need to adjust his Lasix according.  Monitor his electrolytes.

## 2023-04-29 ENCOUNTER — Ambulatory Visit: Payer: PPO | Attending: Cardiovascular Disease | Admitting: Cardiovascular Disease

## 2023-04-29 ENCOUNTER — Encounter: Payer: Self-pay | Admitting: Cardiovascular Disease

## 2023-04-29 VITALS — BP 118/52 | HR 71 | Ht 70.0 in | Wt 202.0 lb

## 2023-04-29 DIAGNOSIS — I5023 Acute on chronic systolic (congestive) heart failure: Secondary | ICD-10-CM

## 2023-04-29 DIAGNOSIS — I10 Essential (primary) hypertension: Secondary | ICD-10-CM | POA: Diagnosis not present

## 2023-04-29 DIAGNOSIS — I480 Paroxysmal atrial fibrillation: Secondary | ICD-10-CM | POA: Diagnosis not present

## 2023-04-29 DIAGNOSIS — Q2381 Bicuspid aortic valve: Secondary | ICD-10-CM

## 2023-04-29 DIAGNOSIS — Q231 Congenital insufficiency of aortic valve: Secondary | ICD-10-CM | POA: Diagnosis not present

## 2023-04-29 DIAGNOSIS — I2510 Atherosclerotic heart disease of native coronary artery without angina pectoris: Secondary | ICD-10-CM

## 2023-04-29 DIAGNOSIS — I739 Peripheral vascular disease, unspecified: Secondary | ICD-10-CM | POA: Diagnosis not present

## 2023-04-29 DIAGNOSIS — E782 Mixed hyperlipidemia: Secondary | ICD-10-CM | POA: Diagnosis not present

## 2023-04-29 NOTE — Assessment & Plan Note (Signed)
History of hyperlipidemia on fenofibrate with lipid profile performed 05/27/2022 revealing total cholesterol 110, LDL 49 and HDL 36.

## 2023-04-29 NOTE — Assessment & Plan Note (Signed)
Patient was recently admitted with volume overload and was diuresed.  His EF was 45 to 50%.  Diastolic parameters are indeterminate.  This was thought to be secondary to diastolic heart failure plus hypoalbuminemia secondary to his hepatocellular carcinoma.  He was diuresed.  He is currently on Lasix and spironolactone.

## 2023-04-29 NOTE — Patient Instructions (Addendum)
Medication Instructions:  Your physician recommends that you continue on your current medications as directed. Please refer to the Current Medication list given to you today.  *If you need a refill on your cardiac medications before your next appointment, please call your pharmacy*   Testing/Procedures: Your physician has requested that you have an echocardiogram. Echocardiography is a painless test that uses sound waves to create images of your heart. It provides your doctor with information about the size and shape of your heart and how well your heart's chambers and valves are working. This procedure takes approximately one hour. There are no restrictions for this procedure. Please do NOT wear cologne, perfume, aftershave, or lotions (deodorant is allowed). Please arrive 15 minutes prior to your appointment time.  **To do in August 2025**   Your physician has requested that you have a lower extremity arterial duplex. During this test, ultrasound is used to evaluate arterial blood flow in the legs. Allow one hour for this exam. There are no restrictions or special instructions. This will take place at [address] [facility], [address].  **To be done in May 2025**  Your physician has requested that you have an ankle brachial index (ABI). During this test an ultrasound and blood pressure cuff are used to evaluate the arteries that supply the arms and legs with blood. Allow thirty minutes for this exam. There are no restrictions or special instructions. This will take place at [address] [facility], [address].  **To be done in May 2025**     Follow-Up: At [facility], you and your health needs are our priority.  As part of our continuing mission to provide you with exceptional heart care, we have created designated Provider Care Teams.  These Care Teams include your primary Cardiologist (physician) and Advanced Practice Providers (APPs -  Physician Assistants and Nurse Practitioners) who all  work together to provide you with the care you need, when you need it.  We recommend signing up for the patient portal called "MyChart".  Sign up information is provided on this After Visit Summary.  MyChart is used to connect with patients for Virtual Visits (Telemedicine).  Patients are able to view lab/test results, encounter notes, upcoming appointments, etc.  Non-urgent messages can be sent to your provider as well.   To learn more about what you can do with MyChart, go to [email or web address].    Your next appointment:   6 month(s)  Provider:   Marjie Skiff, [name], Robet Leu, [name], Azalee Course, [name], or Bernadene Person, NP       Then, Nanetta Batty, MD will plan to see you again in 12 month(s).

## 2023-04-29 NOTE — Assessment & Plan Note (Signed)
History of PAF on Tikosyn in the past now on amiodarone maintaining sinus rhythm.  He is not on oral anticoagulant because of prior bleeding issues.

## 2023-04-29 NOTE — Assessment & Plan Note (Addendum)
History of peripheral arterial disease status post multiple interventions in the past beginning 06/29/2015.  His most recent peripheral angiogram performed by myself 04/27/2018 revealed a 95% calcified right popliteal artery stenosis with three-vessel runoff.  I performed orbital atherectomy, PTA and stenting using a Tigris  7 mm x 40 mm long self-expanding stent.  His Dopplers normalized.  His most recent Doppler studies performed 12/24/2022 revealed normal ABIs bilaterally with patent popliteal stent and mild to moderate SFA disease.

## 2023-04-29 NOTE — Progress Notes (Signed)
04/29/2023 [name]   10/09/1955  [id number]  Primary Physician Pickard, Priscille Heidelberg, MD Primary Cardiologist: Runell Gess MD Milagros Loll, [place], [place]  HPI:  Matthew Chen is a 67 y.o.  mild to moderately overweight married Caucasian male father of 4 children, grandfather of a grandchildren was referred by Dr. Chilton Si for evaluation and treatment of claudication. I last saw him in the office   09/10/2022.  He is accompanied by his wife [name] today.  He  has a history of treated hypertension, diabetes and hyperlipidemia. He is a short distance Naval architect. He smoked 25 pack years and quit 17 years ago. He's never had a heart attack or stroke and denies chest pain or shortness of breath. He does complain of lifestyle limiting claudication and had arterial Doppler studies performed in our office 05/29/15 revealing high-grade right popliteal and distal left SFA disease. Based on this, we decided to proceed with angiography and potential percutaneous revascularization. I initially angiogrammed him on 06/29/15 and performed diamondback orbital rotational atherectomy, drug eluting balloon angioplasty of the distal left SFA for a high-grade, subocclusive calcific/exophytic plaque. He had a high-grade calcific/exophytic subocclusive right popliteal artery plaque for which he underwent diamondback orbital rotation arthrectomy, drug eluding balloon angioplasty on 07/13/15 with an excellent angiographic result. Since that time his claudication has resolved. His Dopplers performed  03/17/2018 revealed normal ABIs bilaterally with the development of a high-frequency signal in his right popliteal artery.  Since I saw him 8 months ago he has developed lifestyle limiting right calf claudication.   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  He had 30 to 40% mid left SFA stenosis.  I performed diamondback orbital rotational  atherectomy, PTA and stenting using a Tigris 7 mm x 40 mm long self-expanding stent.  His Dopplers normalized and his claudication has resolved.    His most recent Dopplers performed 11/13/2020 revealed normal ABIs bilaterally with a patent right SFA stent.  When I saw him 3 weeks ago he was complaining of exertional chest pain and dyspnea over the last several months.  I did obtain a 2D echo that was unremarkable with an EF of 60 to 65% with mild MR and mild AAS.  He did have a 46 mm ascending thoracic aortic aneurysm.  Myoview stress test was nonischemic.  A chest CT did show a 7.1 cm mass in his liver and a MRI was suggested.  He is wearing a event monitor currently.  Based on his symptoms we decided to proceed with outpatient diagnostic coronary angiography.  I performed coronary angiography on him 02/08/2021 revealing a high-grade subtotally occluded ostial calcified first diagonal branch stenosis.  Because of chronic renal insufficiency I elected to stage his intervention for 1 week later but did begin him on Plavix.  On 02/15/2021 I attempted to intervene on his diagonal branch but unfortunately this was complicated by coronary perforation using a "Fielder XT wire".  Luckily, he did not develop a pericardial effusion and this was quickly addressed during the procedure.  He was discharged home 2 days later.  He was found to have PAF during the hospitalization which was confirmed by event monitoring subsequent to that.  Since beginning him on antianginal medications his effort angina has significantly improved.  Since I saw him 9 months ago he has been admitted to the hospital with volume overload/acute on chronic systolic and diastolic heart failure.  He  also has hepatocellular carcinoma and is undergoing therapy.  He was volume overload and diuresed.  He still has 2+ peripheral edema on spironolactone and Lasix.  His 2D echo performed 03/25/2023 revealed decline in his EF from 65% down to 45 to 50%.  He had  mild to moderate MR.  His ascending thoracic aorta measuring 41 mm.  He also had lower extremity arterial Doppler studies performed 12/24/2022 revealing normal ABIs bilaterally with a patent popliteal artery stent and mild bilateral SFA disease.   Current Meds  Medication Sig   acetaminophen (TYLENOL) 500 MG tablet Take 1 tablet (500 mg total) by mouth every 6 (six) hours as needed. (Patient taking differently: Take 500 mg by mouth every 6 (six) hours as needed for moderate pain.)   amiodarone (PACERONE) 200 MG tablet Take 1 tablet (200 mg total) by mouth daily.   aspirin EC 81 MG tablet Take 1 tablet (81 mg total) by mouth daily.   cyanocobalamin (VITAMIN B12) 1000 MCG tablet Take 1,000 mcg by mouth daily.   empagliflozin (JARDIANCE) 10 MG TABS tablet Take 1 tablet (10 mg total) by mouth daily.   feeding supplement (ENSURE ENLIVE / ENSURE PLUS) LIQD Take 237 mLs by mouth 2 (two) times daily between meals.   fenofibrate 160 MG tablet Take 1 tablet (160 mg total) by mouth daily.   furosemide (LASIX) 40 MG tablet Take 1 tablet (40 mg total) by mouth 2 (two) times daily at 10 am and 4 pm. (Patient taking differently: Take 40 mg by mouth daily.)   glucose blood (ONETOUCH ULTRA) test strip CHECK BLOOD SUGAR TWICE A DAY   Insulin Pen Needle (B-D UF III MINI PEN NEEDLES) 31G X 5 MM MISC Use daily with Victoza   Iron, Ferrous Sulfate, 325 (65 Fe) MG TABS Take 325 mg by mouth daily. (Patient taking differently: Take 325 mg by mouth in the morning and at bedtime.)   metoprolol tartrate (LOPRESSOR) 50 MG tablet Take 1 tablet (50 mg total) by mouth 2 (two) times daily.   Omega-3 Fatty Acids (FISH OIL) 1000 MG CAPS Take 1,000 mg by mouth 2 (two) times daily.   pantoprazole (PROTONIX) 40 MG tablet Take 1 tablet (40 mg total) by mouth 2 (two) times daily.   potassium chloride SA (KLOR-CON M) 20 MEQ tablet Take 2 tablets (40 mEq total) by mouth daily.   sitaGLIPtin (JANUVIA) 100 MG tablet Take 1 tablet (100 mg  total) by mouth daily.   spironolactone (ALDACTONE) 25 MG tablet Take 1 tablet (25 mg total) by mouth daily.   vedolizumab (ENTYVIO) 300 MG injection Inject 300 mg as directed See admin instructions. Every 2 months   [DISCONTINUED] diltiazem (CARDIZEM) 30 MG tablet TAKE 1 TABLET (30 MG TOTAL) BY MOUTH 3 (THREE) TIMES DAILY AS NEEDED (PALPITATIONS).   [DISCONTINUED] solifenacin (VESICARE) 10 MG tablet Take 1 tablet (10 mg total) by mouth daily.     Allergies  Allergen Reactions   Penicillins Other (See Comments)    Unsure, childhood reaction Has patient had a PCN reaction causing immediate rash, facial/tongue/throat swelling, SOB or lightheadedness with hypotension: Unknown Has patient had a PCN reaction causing severe rash involving mucus membranes or skin necrosis: Unknown Has patient had a PCN reaction that required hospitalization: No Has patient had a PCN reaction occurring within the last 10 years: No If all of the above answers are "NO", then may proceed with Cephalosporin use.     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, building services  Tobacco Use   Smoking status: Former    Current packs/day: 0.00    Average packs/day: 1.5 packs/day for 25.0 years (37.5 ttl pk-yrs)    Types: Cigarettes    Start date: 08/13/1971    Quit date: 08/12/1996    Years since quitting: 26.7   Smokeless tobacco: Never   Tobacco comments:    Former smoker 09/17/2022  Vaping Use   Vaping status: Never Used  Substance and Sexual Activity   Alcohol use: Yes    Alcohol/week: 1.0 standard drink of alcohol    Types: 1 Cans of beer per week    Comment: social use   Drug use: No   Sexual activity: Yes  Other Topics Concern   Not on file  Social History Narrative   ** Merged  History Encounter **       Social Determinants of Health   Financial Resource Strain: Low Risk  (01/23/2023)   Overall Financial Resource Strain (CARDIA)    Difficulty of Paying Living Expenses: Not hard at all  Food Insecurity: No Food Insecurity (04/16/2023)   Hunger Vital Sign    Worried About Running Out of Food in the Last Year: Never true    Ran Out of Food in the Last Year: Never true  Transportation Needs: No Transportation Needs (04/16/2023)   PRAPARE - Administrator, Civil Service (Medical): No    Lack of Transportation (Non-Medical): No  Physical Activity: Insufficiently Active (01/23/2023)   Exercise Vital Sign    Days of Exercise per Week: 3 days    Minutes of Exercise per Session: 30 min  Stress: No Stress Concern Present (01/23/2023)   [employer] of Occupational Health - Occupational Stress Questionnaire    Feeling of Stress : Only a little  Social Connections: Socially Integrated (01/23/2023)   Social Connection and Isolation Panel [NHANES]    Frequency of Communication with Friends and Family: More than three times a week    Frequency of Social Gatherings with Friends and Family: Three times a week    Attends Religious Services: More than 4 times per year    Active Member of Clubs or Organizations: Yes    Attends Banker Meetings: 1 to 4 times per year    Marital Status: Married  Catering manager Violence: Not At Risk (04/07/2023)   Humiliation, Afraid, Rape, and Kick questionnaire    Fear of Current or Ex-Partner: No    Emotionally Abused: No    Physically Abused: No    Sexually Abused: No     Review of Systems: General: negative for chills, fever, night sweats or weight changes.  Cardiovascular: negative for chest pain, dyspnea on exertion, edema, orthopnea, palpitations, paroxysmal nocturnal dyspnea or shortness of breath Dermatological: negative for rash Respiratory: negative for cough or wheezing Urologic: negative for  hematuria Abdominal: negative for nausea, vomiting, diarrhea, bright red blood per rectum, melena, or hematemesis Neurologic: negative for visual changes, syncope, or dizziness All other systems reviewed and are otherwise negative except as noted above.    Blood pressure (!) 118/52, pulse 71, height 5\' 10"  (1.778 m), weight 202 lb (91.6 kg), SpO2 99%.  General appearance: alert and no distress Neck: no adenopathy, no carotid  bruit, no JVD, supple, symmetrical, trachea midline, and thyroid not enlarged, symmetric, no tenderness/mass/nodules Lungs: clear to auscultation bilaterally Heart: Soft outflow tract murmur Extremities: 2+ pitting edema bilaterally Pulses: 2+ and symmetric Skin: Skin color, texture, turgor normal. No rashes or lesions Neurologic: Grossly normal  EKG not performed today      ASSESSMENT AND PLAN:   Hypertension History of essential hypertension pressure measured today 118/52.  He is on metoprolol.  Paroxysmal atrial fibrillation (HCC) History of PAF on Tikosyn in the past now on amiodarone maintaining sinus rhythm.  He is not on oral anticoagulant because of prior bleeding issues.  Peripheral arterial disease (HCC) History of peripheral arterial disease status post multiple interventions in the past beginning 06/29/2015.  His most recent peripheral angiogram performed by myself 04/27/2018 revealed a 95% calcified right popliteal artery stenosis with three-vessel runoff.  I performed orbital atherectomy, PTA and stenting using a Tigris  7 mm x 40 mm long self-expanding stent.  His Dopplers normalized.  His most recent Doppler studies performed 12/24/2022 revealed normal ABIs bilaterally with patent popliteal stent and mild to moderate SFA disease.  Hyperlipidemia History of hyperlipidemia on fenofibrate with lipid profile performed 05/27/2022 revealing total cholesterol 110, LDL 49 and HDL 36.  Bicuspid aortic valve History of bicuspid aortic valve with mild  ascending aortic dilatation at 41 mm.  He has aortic sclerosis without stenosis.  LV function by 2D echo 03/25/2023 was 45 to 50%, slightly lower than his prior echo where it was 65%.  This may be related to his hepatocellular carcinoma, chemotherapy plus or minus A-fib.  He also has mild to moderate MR.  Coronary artery disease History of CAD status post cardiac catheterization which I performed 02/08/2021 revealing a high-grade subtotally occluded ostial calcified first diagonal branch stenosis.  I attempted intervention with orbital atherectomy and had a microperforation.  Procedure was terminated.  There were no adverse sequela.  Acute on chronic systolic CHF (congestive heart failure) (HCC) Patient was recently admitted with volume overload and was diuresed.  His EF was 45 to 50%.  Diastolic parameters are indeterminate.  This was thought to be secondary to diastolic heart failure plus hypoalbuminemia secondary to his hepatocellular carcinoma.  He was diuresed.  He is currently on Lasix and spironolactone.     Runell Gess MD FACP,FACC,FAHA, [facility] 04/29/2023 9:35 AM

## 2023-04-29 NOTE — Assessment & Plan Note (Signed)
History of essential hypertension pressure measured today 118/52.  He is on metoprolol.

## 2023-04-29 NOTE — Assessment & Plan Note (Signed)
History of bicuspid aortic valve with mild ascending aortic dilatation at 41 mm.  He has aortic sclerosis without stenosis.  LV function by 2D echo 03/25/2023 was 45 to 50%, slightly lower than his prior echo where it was 65%.  This may be related to his hepatocellular carcinoma, chemotherapy plus or minus A-fib.  He also has mild to moderate MR.

## 2023-04-29 NOTE — Assessment & Plan Note (Signed)
History of CAD status post cardiac catheterization which I performed 02/08/2021 revealing a high-grade subtotally occluded ostial calcified first diagonal branch stenosis.  I attempted intervention with orbital atherectomy and had a microperforation.  Procedure was terminated.  There were no adverse sequela.

## 2023-05-02 ENCOUNTER — Other Ambulatory Visit: Payer: Self-pay | Admitting: Family Medicine

## 2023-05-02 ENCOUNTER — Telehealth: Payer: Self-pay | Admitting: Family Medicine

## 2023-05-02 MED ORDER — POTASSIUM CHLORIDE CRYS ER 20 MEQ PO TBCR: 40.0000 meq | EXTENDED_RELEASE_TABLET | Freq: Every day | ORAL | 5 refills | Status: DC

## 2023-05-02 MED ORDER — FUROSEMIDE 40 MG PO TABS: 40.0000 mg | ORAL_TABLET | Freq: Two times a day (BID) | ORAL | 5 refills | Status: DC

## 2023-05-02 NOTE — Telephone Encounter (Signed)
Prescription Request  05/02/2023  LOV: 04/28/2023  What is the name of the medication or equipment?   furosemide (LASIX) 40 MG tablet   potassium chloride SA (KLOR-CON M) 20 MEQ tablet [[id number]]   **Prescribed at hospital; ran out of both meds this week**  Have you contacted your pharmacy to request a refill? Yes   Which pharmacy would you like this sent to?  [id number] [place], [place] - [address] [place] [address] Phone: [phone] Fax: [phone]    Patient notified that their request is being sent to the clinical staff for review and that they should receive a response within 2 business days.   Please advise patient at [phone].

## 2023-05-05 DIAGNOSIS — K5190 Ulcerative colitis, unspecified, without complications: Secondary | ICD-10-CM | POA: Diagnosis not present

## 2023-05-06 ENCOUNTER — Ambulatory Visit: Payer: PPO | Admitting: Nurse Practitioner

## 2023-05-06 ENCOUNTER — Telehealth: Payer: Self-pay | Admitting: *Deleted

## 2023-05-06 ENCOUNTER — Other Ambulatory Visit: Payer: PPO

## 2023-05-06 NOTE — Telephone Encounter (Signed)
Y-90 was rescheduled for 05/15/23. Should he move his 9/26 out to after procedure? Informed him will discuss w/provider and call him back.

## 2023-05-07 NOTE — Telephone Encounter (Signed)
Per Dr. Truett Perna: OK to delay f/u to 2-3 weeks after his Y-90 unless he is having any problems and needs to be seen tomorrow.  Mr. Urciuoli notified and prefers to wait. Scheduling message sent for lab/OV week of 10/21

## 2023-05-08 ENCOUNTER — Inpatient Hospital Stay: Payer: PPO | Admitting: Nurse Practitioner

## 2023-05-08 ENCOUNTER — Inpatient Hospital Stay: Payer: PPO

## 2023-05-09 LAB — LAB REPORT - SCANNED
EGFR: 61
HM Hepatitis Screen: NEGATIVE

## 2023-05-14 ENCOUNTER — Other Ambulatory Visit (HOSPITAL_COMMUNITY): Payer: Self-pay | Admitting: Student

## 2023-05-14 ENCOUNTER — Other Ambulatory Visit: Payer: Self-pay | Admitting: Radiology

## 2023-05-14 DIAGNOSIS — C220 Liver cell carcinoma: Secondary | ICD-10-CM

## 2023-05-14 NOTE — Addendum Note (Signed)
Encounter addended by: Barrett Shell, RT on: 05/14/2023 10:59 AM  Actions taken: Imaging Exam ended

## 2023-05-14 NOTE — H&P (Signed)
Referring Physician(s): Sherrill,B  Supervising Physician: Marliss Coots  Patient Status:  [facility]  Chief Complaint: Hepatocellular carcinoma    Subjective: 67 year old male without significant underlying liver disease (Childs A, ALBI Grade 2) with biopsy proven large right hepatocellular carcinoma status post transarterial radioembolization with split dose segmentectomy on 06/21/21, noted to have new multifocal bilobar small masses compatible with HCC on follow up imaging, status post multiple rounds of chemo and immunotherapy stopped due to poor tolerance. Most recent imaging demonstrating persistent viability of peripheral aspects of treated right lobe mass, corroborated by increasing AFP (most recent 42.1). Patient is status post virtual consultation with Dr. Elby Showers on 02/05/2023 to discuss additional liver directed treatment options. He was deemed an appropriate candidate for radiation segmentectomy with split doses between the right and left lobe liver masses. He underwent hepatic arteriogram with roadmapping study on 03/07/23 , and left hepatic lobe Y90 radioembolization on 03/21/23.  He tested positive for COVID on 03/10/23.  He presents today for right hepatic lobe Y-90 radioembolization.  He denies fever,HA,CP,cough, back pain,N/V or bleeding. He has had weakness, itching, abdominal bloating/discomfort, polyuria. He is markedly jaundiced today.    Past Medical History:  Diagnosis Date   Adenomatous colon polyp 03/2008   Anginal pain (HCC)    Aortic aneurysm, thoracic (HCC) 09/09/2016   4.4 cm by echo 05/2015   Arthritis    "joints in my fingers ache" (07/13/2015)   Bicuspid aortic valve 09/09/2016   Cataract 2019   bilateral eyes   CKD (chronic kidney disease), stage III (HCC)    Coronary artery disease    Diabetes mellitus without complication (HCC)    Hemorrhoids    Hepatocellular carcinoma (HCC)    Hyperlipidemia    Hypertension    Paroxysmal atrial fibrillation (HCC)     Peripheral arterial disease (HCC)    a. dopppler 05/29/2015 revealed high-grade right popliteal and distal left SFA disease b. LE angio 06/28/2015 revealing high grade calcific dx with distal L SFA and R popliteal artery s/p diamondback orbital rotational atherectomy, PTA of L SFA  c. 07/13/2015 diamondback orbital rotational atherectomy and drug eluting balloon angioplasty of R popliteal artery (P2 segment) using distal protection    Type II diabetes mellitus (HCC)    Ulcerative colitis    Past Surgical History:  Procedure Laterality Date   ABDOMINAL AORTOGRAM W/LOWER EXTREMITY N/A 04/27/2018   Procedure: ABDOMINAL AORTOGRAM W/LOWER EXTREMITY;  Surgeon: Runell Gess, MD;  Location: [facility] IN[facility]AS[facility];  Service: Cardiovascular;  Laterality: N/A;   BIOPSY  01/03/2023   Procedure: BIOPSY;  Surgeon: Benancio Deeds, MD;  Location: [facility] ENDOSCOPY;  Service: Gastroenterology;;   COLONOSCOPY     COLONOSCOPY W/ POLYPECTOMY  X 4-5   CORONARY ATHERECTOMY N/A 02/15/2021   Procedure: CORONARY ATHERECTOMY;  Surgeon: Runell Gess, MD;  Location: [facility];  Service: Cardiovascular;  Laterality: N/A;  aborted    CORONARY BALLOON ANGIOPLASTY N/A 02/15/2021   Procedure: CORONARY BALLOON ANGIOPLASTY;  Surgeon: Runell Gess, MD;  Location: [facility];  Service: Cardiovascular;  Laterality: N/A;   COSMETIC SURGERY  < 2000   "removed birthmark from top of my head"   FLEXIBLE SIGMOIDOSCOPY N/A 01/03/2023   Procedure: FLEXIBLE SIGMOIDOSCOPY;  Surgeon: Benancio Deeds, MD;  Location: [facility] ENDOSCOPY;  Service: Gastroenterology;  Laterality: N/A;   IR ANGIOGRAM SELECTIVE EACH ADDITIONAL VESSEL  06/08/2021   IR ANGIOGRAM SELECTIVE EACH ADDITIONAL VESSEL  06/08/2021   IR ANGIOGRAM  SELECTIVE EACH ADDITIONAL VESSEL  06/08/2021   IR ANGIOGRAM SELECTIVE EACH ADDITIONAL VESSEL  06/08/2021   IR ANGIOGRAM SELECTIVE EACH ADDITIONAL VESSEL  06/21/2021   IR ANGIOGRAM SELECTIVE EACH ADDITIONAL  VESSEL  06/21/2021   IR ANGIOGRAM SELECTIVE EACH ADDITIONAL VESSEL  06/21/2021   IR ANGIOGRAM SELECTIVE EACH ADDITIONAL VESSEL  06/21/2021   IR ANGIOGRAM SELECTIVE EACH ADDITIONAL VESSEL  03/07/2023   IR ANGIOGRAM SELECTIVE EACH ADDITIONAL VESSEL  03/07/2023   IR ANGIOGRAM SELECTIVE EACH ADDITIONAL VESSEL  03/21/2023   IR ANGIOGRAM SELECTIVE EACH ADDITIONAL VESSEL  03/21/2023   IR ANGIOGRAM VISCERAL SELECTIVE  06/08/2021   IR ANGIOGRAM VISCERAL SELECTIVE  06/21/2021   IR ANGIOGRAM VISCERAL SELECTIVE  03/07/2023   IR ANGIOGRAM VISCERAL SELECTIVE  03/21/2023   IR EMBO ARTERIAL NOT HEMORR HEMANG INC GUIDE ROADMAPPING  06/08/2021   IR EMBO TUMOR ORGAN ISCHEMIA INFARCT INC GUIDE ROADMAPPING  06/21/2021   IR EMBO TUMOR ORGAN ISCHEMIA INFARCT INC GUIDE ROADMAPPING  06/21/2021   IR EMBO TUMOR ORGAN ISCHEMIA INFARCT INC GUIDE ROADMAPPING  03/21/2023   IR RADIOLOGIST EVAL & MGMT  05/08/2021   IR RADIOLOGIST EVAL & MGMT  07/26/2021   IR RADIOLOGIST EVAL & MGMT  09/05/2021   IR RADIOLOGIST EVAL & MGMT  12/27/2021   IR RADIOLOGIST EVAL & MGMT  02/05/2023   IR RADIOLOGIST EVAL & MGMT  04/18/2023   IR US GUIDE VASC ACCESS LEFT  06/21/2021   IR US GUIDE VASC ACCESS LEFT  03/07/2023   IR US GUIDE VASC ACCESS LEFT  03/07/2023   IR US GUIDE VASC ACCESS LEFT  03/07/2023   IR US GUIDE VASC ACCESS LEFT  03/21/2023   IR US GUIDE VASC ACCESS RIGHT  06/08/2021   LEFT HEART CATH AND CORONARY ANGIOGRAPHY N/A 02/08/2021   Procedure: LEFT HEART CATH AND CORONARY ANGIOGRAPHY;  Surgeon: Runell Gess, MD;  Location: [facility];  Service: Cardiovascular;  Laterality: N/A;   PERIPHERAL VASCULAR ATHERECTOMY  04/27/2018   Procedure: PERIPHERAL VASCULAR ATHERECTOMY;  Surgeon: Runell Gess, MD;  Location: [facility] INVASIVE CV LAB;  Service: Cardiovascular;;  right popliteal artery   PERIPHERAL VASCULAR CATHETERIZATION N/A 06/29/2015   Procedure: Lower Extremity Angiography;  Surgeon: Runell Gess, MD;  Location: [facility] INVASIVE CV  LAB;  Service: Cardiovascular;  Laterality: N/A;   PERIPHERAL VASCULAR CATHETERIZATION N/A 07/13/2015   Procedure: Lower Extremity Angiography;  Surgeon: Runell Gess, MD;  Location: [facility] INVASIVE CV LAB;  Service: Cardiovascular;  Laterality: N/A;   PERIPHERAL VASCULAR CATHETERIZATION  07/13/2015   Procedure: Peripheral Vascular Atherectomy;  Surgeon: Runell Gess, MD;  Location: [facility];  Service: Cardiovascular;;   PERIPHERAL VASCULAR INTERVENTION  04/27/2018   Procedure: PERIPHERAL VASCULAR INTERVENTION;  Surgeon: Runell Gess, MD;  Location: [facility];  Service: Cardiovascular;;  right popliteal artery   POLYPECTOMY     TONSILLECTOMY     UPPER GASTROINTESTINAL ENDOSCOPY       Allergies: Penicillins  Medications: Prior to Admission medications   Medication Sig Start Date End Date Taking? Authorizing Provider  acetaminophen (TYLENOL) 500 MG tablet Take 1 tablet (500 mg total) by mouth every 6 (six) hours as needed. Patient taking differently: Take 500 mg by mouth every 6 (six) hours as needed for moderate pain. 03/27/22   Derwood Kaplan, MD  amiodarone (PACERONE) 200 MG tablet Take 1 tablet (200 mg total) by mouth daily. 01/28/23   Graciella Freer, [name]  aspirin EC 81 MG tablet Take 1  tablet (81 mg total) by mouth daily. 06/01/15   Chilton Si, MD  cyanocobalamin (VITAMIN B12) 1000 MCG tablet Take 1,000 mcg by mouth daily.    [provider]  empagliflozin (JARDIANCE) 10 MG TABS tablet Take 1 tablet (10 mg total) by mouth daily. 10/30/22   Joylene Grapes, NP  feeding supplement (ENSURE ENLIVE / ENSURE PLUS) LIQD Take 237 mLs by mouth 2 (two) times daily between meals. 04/11/23   Lorin Glass, MD  fenofibrate 160 MG tablet Take 1 tablet (160 mg total) by mouth daily. 03/22/22   Donita Brooks, MD  furosemide (LASIX) 40 MG tablet Take 1 tablet (40 mg total) by mouth 2 (two) times daily at 10 am and 4 pm. 05/02/23 06/01/23  Donita Brooks, MD  glucose blood (ONETOUCH ULTRA) test strip CHECK BLOOD SUGAR TWICE A DAY 03/19/21   Donita Brooks, MD  Insulin Pen Needle (B-D UF III MINI PEN NEEDLES) 31G X 5 MM MISC Use daily with Victoza 05/31/22   Donita Brooks, MD  Iron, Ferrous Sulfate, 325 (65 Fe) MG TABS Take 325 mg by mouth daily. Patient taking differently: Take 325 mg by mouth in the morning and at bedtime. 01/22/23   Donita Brooks, MD  metoprolol tartrate (LOPRESSOR) 50 MG tablet Take 1 tablet (50 mg total) by mouth 2 (two) times daily. 04/11/23 05/11/23  Lorin Glass, MD  Omega-3 Fatty Acids (FISH OIL) 1000 MG CAPS Take 1,000 mg by mouth 2 (two) times daily.    [provider]  pantoprazole (PROTONIX) 40 MG tablet Take 1 tablet (40 mg total) by mouth 2 (two) times daily. 03/22/22   Donita Brooks, MD  potassium chloride SA (KLOR-CON M) 20 MEQ tablet Take 2 tablets (40 mEq total) by mouth daily. 05/02/23 06/01/23  Donita Brooks, MD  sitaGLIPtin (JANUVIA) 100 MG tablet Take 1 tablet (100 mg total) by mouth daily. 11/05/22   Park Meo, FNP  spironolactone (ALDACTONE) 25 MG tablet Take 1 tablet (25 mg total) by mouth daily. 04/21/23   Donita Brooks, MD  vedolizumab (ENTYVIO) 300 MG injection Inject 300 mg as directed See admin instructions. Every 2 months    [provider]     Vital Signs: Vitals:   05/15/23 0652  BP: 133/80  Pulse: 62  Resp: 16  Temp: 98 F (36.7 C)  SpO2: 97%      Code Status: FULL CODE  Physical Exam: awake/alert; marked jaundice, scleral icterus; chest- CTA bilat; heart- RRR,+ murmur; abd- sl dist,+BS,some mild diffuse tenderness to palpation; bilat pretibial edema noted  Imaging: No results found.  Labs:  CBC: Recent Labs    04/08/23 0410 04/09/23 0415 04/10/23 0419 04/11/23 0413  WBC 10.8* 10.1 9.5 12.3*  HGB 9.1* 9.5* 10.3* 10.9*  HCT 29.0* 30.2* 33.2* 34.2*  PLT 300 301 318 441*    COAGS: Recent Labs    03/07/23 0819  03/21/23 0820 04/08/23 0410 04/09/23 0415  INR 1.3* 1.2 1.5* 1.4*    BMP: Recent Labs    04/08/23 0410 04/09/23 0415 04/10/23 0419 04/11/23 0413 04/21/23 1536 04/28/23 0810  NA 132* 140 137 140 139 138  K 4.0 3.4* 3.2* 3.8 4.2 4.3  CL 101 102 100 99 103 103  CO2 23 25 25 30 26 27   GLUCOSE 109* 101* 107* 111* 120* 73  BUN 25* 31* 39* 42* 25 21  CALCIUM 7.9* 8.3* 8.0* 8.5* 8.3* 8.4*  CREATININE 1.52* 1.42* 1.34* 1.47* 1.14  1.07  GFRNONAA 50* 54* 58* 52*  --   --     LIVER FUNCTION TESTS: Recent Labs    04/08/23 0410 04/09/23 0415 04/10/23 0419 04/11/23 0413 04/21/23 1536  BILITOT 3.8* 3.6* 3.0* 2.9* 3.1*  AST 107* 69* 61* 66* 53*  ALT 26 25 23 26 21   ALKPHOS 187* 185* 197* 219*  --   PROT 5.8* 5.8* 5.9* 6.6 5.7*  ALBUMIN 1.8* 1.7* 1.8* 1.9*  --     Assessment and Plan: 67 year old male without significant underlying liver disease (Childs A, ALBI Grade 2) with biopsy proven large right hepatocellular carcinoma status post transarterial radioembolization with split dose segmentectomy on 06/21/21, noted to have new multifocal bilobar small masses compatible with HCC on follow up imaging, status post multiple rounds of chemo and immunotherapy stopped due to poor tolerance. Most recent imaging demonstrating persistent viability of peripheral aspects of treated right lobe mass, corroborated by increasing AFP (most recent 42.1). Patient is status post virtual consultation with Dr. Elby Showers on 02/05/2023 to discuss additional liver directed treatment options. He was deemed an appropriate candidate for radiation segmentectomy with split doses between the right and left lobe liver masses. He underwent hepatic arteriogram with roadmapping study on 03/07/23 , and left hepatic lobe Y90 radioembolization on 03/21/23.  He tested positive for COVID on 03/10/23.  He presents today for right hepatic lobe Y-90 radioembolization. Labs performed today reveal WBC 10.8, HGB 11.4, PLTS 277K, PT/INR  14.9/1.2, K 4.1, CREAT 1.46, ALBUMIN 1.9, T BILI 8(3.1); pt seen by Dr. Elby Showers and labs reviewed- pt's Y-90 treatment cancelled today secondary to sig hyperbilirubinemia; Dr. Truett Perna notified and will make plans for pt to see him in office tent on 10/4 (previous appt was for 10/14 per pt). Pt/wife updated with plans.       Electronically Signed: D. Jeananne Rama, [name] 05/14/2023, 5:06 PM   I spent a total of 20 minutes at the the patient's bedside AND on the patient's hospital floor or unit, greater than 50% of which was counseling/coordinating care for possible right hepatic Y90 radioembolization

## 2023-05-14 NOTE — Addendum Note (Signed)
Encounter addended by: Barrett Shell, RT on: 05/14/2023 11:02 AM  Actions taken: Imaging Exam ended

## 2023-05-14 NOTE — Addendum Note (Signed)
Encounter addended by: Barrett Shell, RT on: 05/14/2023 10:56 AM  Actions taken: Imaging Exam ended

## 2023-05-15 ENCOUNTER — Encounter (HOSPITAL_COMMUNITY): Payer: Self-pay

## 2023-05-15 ENCOUNTER — Ambulatory Visit (HOSPITAL_COMMUNITY)
Admission: RE | Admit: 2023-05-15 | Discharge: 2023-05-15 | Disposition: A | Discharge status: Home / Self Care | Payer: PPO | Source: Ambulatory Visit | Attending: Interventional Radiology | Admitting: Interventional Radiology

## 2023-05-15 ENCOUNTER — Telehealth: Payer: Self-pay

## 2023-05-15 ENCOUNTER — Encounter (HOSPITAL_COMMUNITY)
Admission: RE | Admit: 2023-05-15 | Discharge: 2023-05-15 | Disposition: A | Discharge status: Home / Self Care | Payer: PPO | Source: Ambulatory Visit | Attending: Interventional Radiology | Admitting: Interventional Radiology

## 2023-05-15 DIAGNOSIS — Z539 Procedure and treatment not carried out, unspecified reason: Secondary | ICD-10-CM | POA: Diagnosis not present

## 2023-05-15 DIAGNOSIS — C220 Liver cell carcinoma: Secondary | ICD-10-CM | POA: Insufficient documentation

## 2023-05-15 HISTORY — DX: Dyspnea, unspecified: R06.00

## 2023-05-15 LAB — COMPREHENSIVE METABOLIC PANEL
ALT: 47 U/L — ABNORMAL HIGH (ref 0–44)
AST: 91 U/L — ABNORMAL HIGH (ref 15–41)
Albumin: 1.9 g/dL — ABNORMAL LOW (ref 3.5–5.0)
Alkaline Phosphatase: 300 U/L — ABNORMAL HIGH (ref 38–126)
Anion gap: 13 (ref 5–15)
BUN: 29 mg/dL — ABNORMAL HIGH (ref 8–23)
CO2: 24 mmol/L (ref 22–32)
Calcium: 9.3 mg/dL (ref 8.9–10.3)
Chloride: 98 mmol/L (ref 98–111)
Creatinine, Ser: 1.46 mg/dL — ABNORMAL HIGH (ref 0.61–1.24)
GFR, Estimated: 52 mL/min — ABNORMAL LOW (ref >60)
Glucose, Bld: 118 mg/dL — ABNORMAL HIGH (ref 70–99)
Potassium: 4.1 mmol/L (ref 3.5–5.1)
Sodium: 135 mmol/L (ref 135–145)
Total Bilirubin: 8 mg/dL — ABNORMAL HIGH (ref 0.3–1.2)
Total Protein: 6.9 g/dL (ref 6.5–8.1)

## 2023-05-15 LAB — PROTIME-INR
INR: 1.2 (ref 0.8–1.2)
Prothrombin Time: 14.9 s (ref 11.4–15.2)

## 2023-05-15 LAB — CBC WITH DIFFERENTIAL/PLATELET
Abs Immature Granulocytes: 0.11 10*3/uL — ABNORMAL HIGH (ref 0.00–0.07)
Basophils Absolute: 0.1 10*3/uL (ref 0.0–0.1)
Basophils Relative: 1 %
Eosinophils Absolute: 0.2 10*3/uL (ref 0.0–0.5)
Eosinophils Relative: 2 %
HCT: 38.1 % — ABNORMAL LOW (ref 39.0–52.0)
Hemoglobin: 11.4 g/dL — ABNORMAL LOW (ref 13.0–17.0)
Immature Granulocytes: 1 %
Lymphocytes Relative: 6 %
Lymphs Abs: 0.7 10*3/uL (ref 0.7–4.0)
MCH: 29.8 pg (ref 26.0–34.0)
MCHC: 29.9 g/dL — ABNORMAL LOW (ref 30.0–36.0)
MCV: 99.5 fL (ref 80.0–100.0)
Monocytes Absolute: 1 10*3/uL (ref 0.1–1.0)
Monocytes Relative: 10 %
Neutro Abs: 8.7 10*3/uL — ABNORMAL HIGH (ref 1.7–7.7)
Neutrophils Relative %: 80 %
Platelets: 277 10*3/uL (ref 150–400)
RBC: 3.83 MIL/uL — ABNORMAL LOW (ref 4.22–5.81)
RDW: 23.2 % — ABNORMAL HIGH (ref 11.5–15.5)
WBC: 10.8 10*3/uL — ABNORMAL HIGH (ref 4.0–10.5)
nRBC: 0.3 % — ABNORMAL HIGH (ref 0.0–0.2)

## 2023-05-15 LAB — GLUCOSE, CAPILLARY: Glucose-Capillary: 103 mg/dL — ABNORMAL HIGH (ref 70–99)

## 2023-05-15 MED ORDER — SODIUM CHLORIDE 0.9 % IV SOLN
8.0000 mg | Freq: Once | INTRAVENOUS | Status: DC
  Filled 2023-05-15: qty 4

## 2023-05-15 MED ORDER — PANTOPRAZOLE SODIUM 40 MG IV SOLR: 40.0000 mg | Freq: Once | INTRAVENOUS | Status: DC

## 2023-05-15 MED ORDER — SODIUM CHLORIDE 0.9 % IV SOLN: INTRAVENOUS | Status: DC

## 2023-05-15 MED ORDER — DEXAMETHASONE SODIUM PHOSPHATE 10 MG/ML IJ SOLN
8.0000 mg | Freq: Once | INTRAMUSCULAR | Status: AC
  Administered 2023-05-15: 8 mg via INTRAVENOUS
  Filled 2023-05-15: qty 1

## 2023-05-15 MED ORDER — LEVOFLOXACIN IN D5W 500 MG/100ML IV SOLN
500.0000 mg | Freq: Once | INTRAVENOUS | Status: DC
  Filled 2023-05-15: qty 100

## 2023-05-15 NOTE — Telephone Encounter (Signed)
Pt called in wanting to know if he should stop taking the one or both of the fluid pills that pcp has him on. Pt states that his weight is now 184 lbs. Pt stated that pcp wanted him to stay around 200 lbs. Please advise.   Cb#: [phone]

## 2023-05-15 NOTE — Addendum Note (Signed)
Encounter addended by: Assunta Found, RT on: 05/15/2023 8:06 AM  Actions taken: Imaging Exam ended

## 2023-05-15 NOTE — Sedation Documentation (Signed)
Bili 8.0 case cancelled per MD.

## 2023-05-16 ENCOUNTER — Other Ambulatory Visit: Payer: Self-pay | Admitting: *Deleted

## 2023-05-16 ENCOUNTER — Telehealth: Payer: Self-pay | Admitting: Nurse Practitioner

## 2023-05-16 DIAGNOSIS — C220 Liver cell carcinoma: Secondary | ICD-10-CM

## 2023-05-16 LAB — AFP TUMOR MARKER: AFP, Serum, Tumor Marker: 57.6 ng/mL — ABNORMAL HIGH (ref 0.0–8.4)

## 2023-05-16 NOTE — Telephone Encounter (Signed)
I spoke with Matthew Chen wife regarding appointment time 05/19/2023 at 915 for lab and to see Dr. Truett Perna.  She will relay this information to Matthew Chen.

## 2023-05-19 ENCOUNTER — Ambulatory Visit (HOSPITAL_BASED_OUTPATIENT_CLINIC_OR_DEPARTMENT_OTHER)
Admission: RE | Admit: 2023-05-19 | Discharge: 2023-05-19 | Disposition: A | Discharge status: Home / Self Care | Payer: PPO | Source: Ambulatory Visit | Attending: Oncology | Admitting: Oncology

## 2023-05-19 ENCOUNTER — Inpatient Hospital Stay: Payer: PPO | Attending: Oncology | Admitting: Oncology

## 2023-05-19 ENCOUNTER — Inpatient Hospital Stay: Payer: PPO

## 2023-05-19 VITALS — BP 139/67 | HR 68 | Temp 98.2°F | Resp 18 | Ht 70.0 in | Wt 191.1 lb

## 2023-05-19 DIAGNOSIS — I129 Hypertensive chronic kidney disease with stage 1 through stage 4 chronic kidney disease, or unspecified chronic kidney disease: Secondary | ICD-10-CM | POA: Insufficient documentation

## 2023-05-19 DIAGNOSIS — C220 Liver cell carcinoma: Secondary | ICD-10-CM

## 2023-05-19 DIAGNOSIS — D631 Anemia in chronic kidney disease: Secondary | ICD-10-CM | POA: Insufficient documentation

## 2023-05-19 DIAGNOSIS — I480 Paroxysmal atrial fibrillation: Secondary | ICD-10-CM | POA: Insufficient documentation

## 2023-05-19 DIAGNOSIS — L299 Pruritus, unspecified: Secondary | ICD-10-CM | POA: Insufficient documentation

## 2023-05-19 DIAGNOSIS — I2510 Atherosclerotic heart disease of native coronary artery without angina pectoris: Secondary | ICD-10-CM | POA: Insufficient documentation

## 2023-05-19 DIAGNOSIS — K8020 Calculus of gallbladder without cholecystitis without obstruction: Secondary | ICD-10-CM | POA: Diagnosis not present

## 2023-05-19 DIAGNOSIS — R188 Other ascites: Secondary | ICD-10-CM | POA: Diagnosis not present

## 2023-05-19 DIAGNOSIS — M549 Dorsalgia, unspecified: Secondary | ICD-10-CM | POA: Insufficient documentation

## 2023-05-19 DIAGNOSIS — R5381 Other malaise: Secondary | ICD-10-CM | POA: Insufficient documentation

## 2023-05-19 DIAGNOSIS — I739 Peripheral vascular disease, unspecified: Secondary | ICD-10-CM | POA: Insufficient documentation

## 2023-05-19 DIAGNOSIS — N189 Chronic kidney disease, unspecified: Secondary | ICD-10-CM | POA: Insufficient documentation

## 2023-05-19 DIAGNOSIS — E1122 Type 2 diabetes mellitus with diabetic chronic kidney disease: Secondary | ICD-10-CM | POA: Insufficient documentation

## 2023-05-19 DIAGNOSIS — R200 Anesthesia of skin: Secondary | ICD-10-CM | POA: Insufficient documentation

## 2023-05-19 LAB — CBC WITH DIFFERENTIAL (CANCER CENTER ONLY)
Abs Immature Granulocytes: 0.15 10*3/uL — ABNORMAL HIGH (ref 0.00–0.07)
Basophils Absolute: 0 10*3/uL (ref 0.0–0.1)
Basophils Relative: 0 %
Eosinophils Absolute: 0.2 10*3/uL (ref 0.0–0.5)
Eosinophils Relative: 2 %
HCT: 38 % — ABNORMAL LOW (ref 39.0–52.0)
Hemoglobin: 12 g/dL — ABNORMAL LOW (ref 13.0–17.0)
Immature Granulocytes: 2 %
Lymphocytes Relative: 5 %
Lymphs Abs: 0.5 10*3/uL — ABNORMAL LOW (ref 0.7–4.0)
MCH: 30.5 pg (ref 26.0–34.0)
MCHC: 31.6 g/dL (ref 30.0–36.0)
MCV: 96.7 fL (ref 80.0–100.0)
Monocytes Absolute: 0.8 10*3/uL (ref 0.1–1.0)
Monocytes Relative: 8 %
Neutro Abs: 7.7 10*3/uL (ref 1.7–7.7)
Neutrophils Relative %: 83 %
Platelet Count: 252 10*3/uL (ref 150–400)
RBC: 3.93 MIL/uL — ABNORMAL LOW (ref 4.22–5.81)
RDW: 22.9 % — ABNORMAL HIGH (ref 11.5–15.5)
WBC Count: 9.3 10*3/uL (ref 4.0–10.5)
nRBC: 0.4 % — ABNORMAL HIGH (ref 0.0–0.2)

## 2023-05-19 LAB — CMP (CANCER CENTER ONLY)
ALT: 53 U/L — ABNORMAL HIGH (ref 0–44)
AST: 88 U/L — ABNORMAL HIGH (ref 15–41)
Albumin: 2.6 g/dL — ABNORMAL LOW (ref 3.5–5.0)
Alkaline Phosphatase: 318 U/L — ABNORMAL HIGH (ref 38–126)
Anion gap: 9 (ref 5–15)
BUN: 31 mg/dL — ABNORMAL HIGH (ref 8–23)
CO2: 25 mmol/L (ref 22–32)
Calcium: 9.2 mg/dL (ref 8.9–10.3)
Chloride: 102 mmol/L (ref 98–111)
Creatinine: 1.24 mg/dL (ref 0.61–1.24)
GFR, Estimated: >60 mL/min (ref >60)
Glucose, Bld: 157 mg/dL — ABNORMAL HIGH (ref 70–99)
Potassium: 4.2 mmol/L (ref 3.5–5.1)
Sodium: 136 mmol/L (ref 135–145)
Total Bilirubin: 7.6 mg/dL (ref 0.3–1.2)
Total Protein: 6.3 g/dL — ABNORMAL LOW (ref 6.5–8.1)

## 2023-05-19 MED ORDER — IOHEXOL 300 MG/ML  SOLN
100.0000 mL | Freq: Once | INTRAMUSCULAR | Status: AC | PRN
  Administered 2023-05-19: 100 mL via INTRAVENOUS

## 2023-05-19 NOTE — Progress Notes (Unsigned)
CRITICAL VALUE STICKER  CRITICAL VALUE: 7.6  RECEIVER (on-site recipient of call): Cyndia Bent  DATE & TIME NOTIFIED: 05/19/2023 0948  MESSENGER (representative from lab): Maurine Minister  MD NOTIFIED: Truett Perna  TIME OF NOTIFICATION:05/19/2023 [phone]  RESPONSE:

## 2023-05-19 NOTE — Progress Notes (Signed)
[facility] OFFICE PROGRESS NOTE   Diagnosis: Hepatocellular carcinoma  INTERVAL HISTORY:   Matthew Chen for Y90 treatment on 05/15/2023.  He had undergone left hepatic Y90 radioembolization on 03/21/2023.  The bilirubin returned markedly elevated on 05/15/2023.  The planned Y90 procedure was aborted.  Matthew Chen reports pruritus for the past 3 weeks.  He has malaise.  No pain.  Good appetite.  His wife has noted jaundice for several weeks.  Objective:  Vital signs in last 24 hours:  Blood pressure 139/67, pulse 68, temperature 98.2 F (36.8 C), temperature source Temporal, resp. rate 18, height 5\' 10"  (1.778 m), weight 191 lb 1.6 oz (86.7 kg), SpO2 100%.    HEENT: Sclera and subungual icterus Resp: Lungs clear bilaterally Cardio: Regular rate and rhythm GI: The liver edge is palpable in the right subcostal region with associated tenderness, the abdomen is distended Vascular: Trace-1+ pitting edema to lower leg bilaterally Skin: Jaundice   Lab Results:  Lab Results  Component Value Date   WBC 9.3 05/19/2023   HGB 12.0 (L) 05/19/2023   HCT 38.0 (L) 05/19/2023   MCV 96.7 05/19/2023   PLT 252 05/19/2023   NEUTROABS 7.7 05/19/2023    CMP  Lab Results  Component Value Date   NA 136 05/19/2023   K 4.2 05/19/2023   CL 102 05/19/2023   CO2 25 05/19/2023   GLUCOSE 157 (H) 05/19/2023   BUN 31 (H) 05/19/2023   CREATININE 1.24 05/19/2023   CALCIUM 9.2 05/19/2023   PROT 6.3 (L) 05/19/2023   ALBUMIN 2.6 (L) 05/19/2023   AST 88 (H) 05/19/2023   ALT 53 (H) 05/19/2023   ALKPHOS 318 (H) 05/19/2023   BILITOT 7.6 (HH) 05/19/2023   GFRNONAA >60 05/19/2023   GFRAA 49 (L) 11/20/2020    Lab Results  Component Value Date   CEA1 1.9 06/21/2021    Medications: I have reviewed the patient's current medications.   Assessment/Plan: Hepatocellular carcinoma CT angio chest aorta 01/30/2021-no evidence of thoracic aortic aneurysm, diffuse hepatic steatosis, suggestion of  early cirrhosis, 7.1 cm enhancing masslike area in segment 5/6, prominent gastrohepatic and periportal nodes measuring up to 7 mm MRI liver 03/22/2021-arterial hyperenhancing subcapsular mass, segment 6, 7 x 5.1 cm, LI-RADS 5, no pathologically large lymph nodes Ultrasound-guided biopsy 04/24/2021-hepatocellular carcinoma Y90 radioembolization of segment 6 and segment 7 hepatic arterial branches 06/21/2021 MRI abdomen 09/03/2021-new increased enhancement inferior and superior to the dominant right liver lesion, new 11 mm enhancing lesion in the left liver LIRADS 3, dominant right hepatic lesion stable in size MRI abdomen 12/19/2021-unchanged hyperenhancing mass in the anterior inferior right liver measuring 7.8 x 5 cm, interval enlargement of a arterial enhancing lesion in segment 2- LIRADS 5, new small enhancing lesions hepatic segment 4A measuring 1.2 x 0.9 cm and 0.8 x 0.6 cm- LIRADS 5 Cycle 1 atezolizumab /bevacizumab 02/07/2022 Cycle 2 atezolizumab/bevacizumab 02/27/2022 Cycle 3 atezolizumab/bevacizumab 03/21/2022 Cycle 4 atezolizumab/bevacizumab 04/22/2022 MRI abdomen 05/05/2022-no change in dominant segment 6 lesion, no change in hyperenhancing lesions in segment 4A and segment 2, no new lesions Cycle 5 atezolizumab/bevacizumab 05/13/2022 Cycle 6 atezolizumab/bevacizumab 06/04/2022 Cycle 7 atezolizumab/bevacizumab 06/24/2022 Cycle 8 atezolizumab/bevacizumab 07/15/2022 MRI abdomen 07/20/2022-mild progression of bilateral liver lesions, no evidence of extrahepatic metastatic disease MRI abdomen [facility] 08/19/2022-increased size of the dominant admitted 5/6 lesion, now measuring 12.7 x 8.8 cm, multiple additional hepatic lesions appear larger than on the previous exam, stable mildly enlarged upper abdominal lymph nodes Lenvatinib 09/09/2022, stopped 10/04/2022 Cabozantinib 10/11/2022 MRI abdomen 12/27/2022-technical  limitation on the 07/20/2022 exam makes direct comparison difficult, notes significant change in the  enhancing liver lesions Cabozantinib placed on hold 12/31/2022 Seen by Dr. Elby Showers Interventional Radiology 02/05/2023-plan for radiation segmentectomy with split doses between right and left lobe masses 03/21/2023-Y90 left lobe Ulcerative colitis-maintained on vedolizumab Peripheral arterial disease History of a thoracic aortic aneurysm CAD Diabetes Hypertension Chronic renal failure Paroxysmal atrial fibrillation Admission 01/02/2023 with increased diarrhea-sigmoidoscopy with a single ulcer at the dentate line and an anal fissure, sigmoid colon unremarkable.  Negative enteric pathogen panel Anemia secondary to chronic disease, renal failure, possible GI bleeding-status post Red cell transfusion 02/14/2023. Marked hyperbilirubinemia 05/15/2023    Disposition: [name] has a history of hepatocellular carcinoma.  He underwent Y90 to the left liver on 03/21/2023.  He was scheduled for right liver Y90 on 05/15/2023, but the bilirubin returned markedly elevated.  Matthew Chen last underwent abdominal imaging in May.  I discussed the possible etiology of the hyperbilirubinemia with Matthew Chen.  The elevated bilirubin could be related to disease progression in the liver, toxicity from Y90, or biliary obstruction.  He will be referred for an urgent CT abdomen/pelvis and return for an office visit tomorrow.  He will begin a trial of Benadryl for pruritus.  He will seek medical attention for a fever or chills.  Matthew Papas, MD  05/19/2023  10:13 AM

## 2023-05-20 ENCOUNTER — Other Ambulatory Visit: Payer: Self-pay | Admitting: Family Medicine

## 2023-05-20 ENCOUNTER — Inpatient Hospital Stay: Payer: PPO | Admitting: Nurse Practitioner

## 2023-05-20 ENCOUNTER — Other Ambulatory Visit: Payer: Self-pay | Admitting: Nurse Practitioner

## 2023-05-20 VITALS — BP 130/70 | HR 65 | Temp 97.9°F | Resp 18 | Ht 70.0 in | Wt 192.0 lb

## 2023-05-20 DIAGNOSIS — R200 Anesthesia of skin: Secondary | ICD-10-CM | POA: Diagnosis not present

## 2023-05-20 DIAGNOSIS — R197 Diarrhea, unspecified: Secondary | ICD-10-CM

## 2023-05-20 DIAGNOSIS — I739 Peripheral vascular disease, unspecified: Secondary | ICD-10-CM | POA: Diagnosis not present

## 2023-05-20 DIAGNOSIS — K51219 Ulcerative (chronic) proctitis with unspecified complications: Secondary | ICD-10-CM

## 2023-05-20 DIAGNOSIS — K51311 Ulcerative (chronic) rectosigmoiditis with rectal bleeding: Secondary | ICD-10-CM

## 2023-05-20 DIAGNOSIS — C220 Liver cell carcinoma: Secondary | ICD-10-CM

## 2023-05-20 DIAGNOSIS — M549 Dorsalgia, unspecified: Secondary | ICD-10-CM | POA: Diagnosis not present

## 2023-05-20 DIAGNOSIS — I2510 Atherosclerotic heart disease of native coronary artery without angina pectoris: Secondary | ICD-10-CM | POA: Diagnosis not present

## 2023-05-20 DIAGNOSIS — N189 Chronic kidney disease, unspecified: Secondary | ICD-10-CM | POA: Diagnosis not present

## 2023-05-20 DIAGNOSIS — E1122 Type 2 diabetes mellitus with diabetic chronic kidney disease: Secondary | ICD-10-CM | POA: Diagnosis not present

## 2023-05-20 DIAGNOSIS — I129 Hypertensive chronic kidney disease with stage 1 through stage 4 chronic kidney disease, or unspecified chronic kidney disease: Secondary | ICD-10-CM | POA: Diagnosis not present

## 2023-05-20 DIAGNOSIS — I480 Paroxysmal atrial fibrillation: Secondary | ICD-10-CM | POA: Diagnosis not present

## 2023-05-20 DIAGNOSIS — L299 Pruritus, unspecified: Secondary | ICD-10-CM | POA: Diagnosis not present

## 2023-05-20 DIAGNOSIS — R5381 Other malaise: Secondary | ICD-10-CM | POA: Diagnosis not present

## 2023-05-20 DIAGNOSIS — D631 Anemia in chronic kidney disease: Secondary | ICD-10-CM | POA: Diagnosis not present

## 2023-05-20 NOTE — Telephone Encounter (Signed)
Discontinued None Dimitri Ped, [place] [birth year] 1914         Requested Prescriptions  Refused Prescriptions Disp Refills   colestipol (COLESTID) 1 g tablet [Pharmacy Med Name: COLESTIPOL HCL 1 GM TABLET] 60 tablet 0    Sig: TAKE 1 TABLET BY MOUTH 2 TIMES DAILY.     Cardiovascular:  Antilipid - Bile Acid Sequestrants Failed - 05/20/2023  8:19 AM      Failed - Valid encounter within last 12 months    Recent Outpatient Visits           2 years ago Type 2 diabetes mellitus with other specified complication, unspecified whether long term insulin use (HCC)   [facility] Pickard, Priscille Heidelberg, MD   3 years ago Type 2 diabetes mellitus with other specified complication, unspecified whether long term insulin use (HCC)   [facility] Pickard, Priscille Heidelberg, MD   3 years ago Benign essential HTN   [facility] Tanya Nones, Priscille Heidelberg, MD   4 years ago Benign essential HTN   [facility] Tanya Nones, Priscille Heidelberg, MD   4 years ago Benign essential HTN   [facility] Pickard, Priscille Heidelberg, MD              Failed - Lipid Panel in normal range within the last 12 months    Cholesterol, Total  Date Value Ref Range Status  10/07/2016 119 100 - 199 mg/dL Final   Cholesterol  Date Value Ref Range Status  05/27/2022 110 <200 mg/dL Final   LDL Cholesterol (Calc)  Date Value Ref Range Status  05/27/2022 49 mg/dL (calc) Final    Comment:    Reference range: <100 . Desirable range <100 mg/dL for primary prevention;   <70 mg/dL for patients with CHD or diabetic patients  with > or = 2 CHD risk factors. Marland Kitchen LDL-C is now calculated using the Martin-Hopkins  calculation, which is a validated novel method providing  better accuracy than the Friedewald equation in the  estimation of LDL-C.  Horald Pollen et al. Lenox Ahr. 7829;562(13): 2061-2068  (http://education.[facility].com/faq/FAQ164)    HDL  Date Value Ref Range Status   05/27/2022 36 (L) > OR = 40 mg/dL Final  08/65/7846 26 (L) >39 mg/dL Final   Triglycerides  Date Value Ref Range Status  05/27/2022 175 (H) <150 mg/dL Final

## 2023-05-20 NOTE — Progress Notes (Unsigned)
[facility] OFFICE PROGRESS NOTE   Diagnosis: Hepatocellular carcinoma  INTERVAL HISTORY:   Matthew Chen returns as scheduled.  Pruritus is better since beginning Benadryl.  Continued dyspnea.  No significant abdominal pain.  No nausea or vomiting.  Bowels are moving.  Poor energy level.  Objective:  Vital signs in last 24 hours:  Blood pressure 130/70, pulse 65, temperature 97.9 F (36.6 C), temperature source Temporal, resp. rate 18, height 5\' 10"  (1.778 m), weight 192 lb (87.1 kg), SpO2 100%.    HEENT: Scleral icterus. Resp: Lungs clear bilaterally. Cardio: Regular rate and rhythm. GI: Abdomen is distended consistent with ascites. Vascular: Trace pitting edema lower leg bilaterally. Neuro: Alert and oriented. Skin: Jaundice.   Lab Results:  Lab Results  Component Value Date   WBC 9.3 05/19/2023   HGB 12.0 (L) 05/19/2023   HCT 38.0 (L) 05/19/2023   MCV 96.7 05/19/2023   PLT 252 05/19/2023   NEUTROABS 7.7 05/19/2023    Imaging:  CT ABDOMEN PELVIS W CONTRAST  Result Date: 05/19/2023 CLINICAL DATA:  History of hepatocellular carcinoma with increasing hyperbilirubinemia and serum AFP levels. Status post transarterial radioembolization with split dose segmentectomy on 06/21/2021. * Tracking Code: BO * EXAM: CT ABDOMEN AND PELVIS WITH CONTRAST TECHNIQUE: Multidetector CT imaging of the abdomen and pelvis was performed using the standard protocol following bolus administration of intravenous contrast. RADIATION DOSE REDUCTION: This exam was performed according to the departmental dose-optimization program which includes automated exposure control, adjustment of the mA and/or kV according to patient size and/or use of iterative reconstruction technique. CONTRAST:  OMNIPAQUE IOHEXOL 300 MG/ML  SOLN COMPARISON:  MRI abdomen 12/27/2022 and 07/20/2022. Chest CT 10/05/2022. No previous abdominal CT imaging available. FINDINGS: Lower chest: Clear lung bases. No  significant pleural or pericardial effusion. Coronary artery atherosclerosis. Hepatobiliary: Widespread heterogeneous low density throughout the liver consistent with known multifocal hepatocellular carcinoma. The individual lesions previously measured on MRI appear less discrete on CT, although overall involvement within the right lobe may be slightly progressive. No portal vein tumor identified. The gallbladder is contracted. There are multiple small gallstones. No significant gallbladder wall thickening or biliary dilatation. Pancreas: Unremarkable. No pancreatic ductal dilatation or surrounding inflammatory changes. Spleen: Normal in size without focal abnormality. Adrenals/Urinary Tract: Both adrenal glands appear normal. No evidence of urinary tract calculus, suspicious renal lesion or hydronephrosis. The bladder appears unremarkable for its degree of distention. Stomach/Bowel: No enteric contrast administered. The stomach appears unremarkable for its degree of distension. No evidence of bowel wall thickening, distention or surrounding inflammatory change. The appendix is tentatively identified and appears unremarkable. Vascular/Lymphatic: There are no enlarged abdominal or pelvic lymph nodes. Aortic and branch vessel atherosclerosis without evidence of aneurysm or large vessel occlusion. The portal, superior mesenteric and splenic veins are patent. Reproductive: The prostate gland and seminal vesicles appear unremarkable. Other: Since the previous MRI, the patient has developed a moderate amount of ascites. There is edema throughout the omental and mesenteric fat without discrete nodularity to suggest carcinomatosis. Musculoskeletal: No acute or significant osseous findings. Mild degenerative changes in the hips and lumbar spine. Old right anterior rib fracture. Unless specific follow-up recommendations are mentioned in the findings or impression sections, no imaging follow-up of any mentioned incidental  findings is recommended. IMPRESSION: 1. Widespread heterogeneous low density throughout the liver consistent with known multifocal hepatocellular carcinoma. The individual lesions previously measured on MRI appear less discrete on CT, although overall involvement within the right lobe may be slightly progressive.  Follow up MRI could be performed to allow easier comparison if clinically warranted. 2. Interval development of a moderate amount of ascites with edema throughout the omental and mesenteric fat. No discrete nodularity to suggest carcinomatosis. 3. Cholelithiasis without evidence of cholecystitis or biliary dilatation. 4.  Aortic Atherosclerosis (ICD10-I70.0). Electronically Signed   By: Carey Bullocks M.D.   On: 05/19/2023 13:18    Medications: I have reviewed the patient's current medications.  Assessment/Plan: Hepatocellular carcinoma CT angio chest aorta 01/30/2021-no evidence of thoracic aortic aneurysm, diffuse hepatic steatosis, suggestion of early cirrhosis, 7.1 cm enhancing masslike area in segment 5/6, prominent gastrohepatic and periportal nodes measuring up to 7 mm MRI liver 03/22/2021-arterial hyperenhancing subcapsular mass, segment 6, 7 x 5.1 cm, LI-RADS 5, no pathologically large lymph nodes Ultrasound-guided biopsy 04/24/2021-hepatocellular carcinoma Y90 radioembolization of segment 6 and segment 7 hepatic arterial branches 06/21/2021 MRI abdomen 09/03/2021-new increased enhancement inferior and superior to the dominant right liver lesion, new 11 mm enhancing lesion in the left liver LIRADS 3, dominant right hepatic lesion stable in size MRI abdomen 12/19/2021-unchanged hyperenhancing mass in the anterior inferior right liver measuring 7.8 x 5 cm, interval enlargement of a arterial enhancing lesion in segment 2- LIRADS 5, new small enhancing lesions hepatic segment 4A measuring 1.2 x 0.9 cm and 0.8 x 0.6 cm- LIRADS 5 Cycle 1 atezolizumab /bevacizumab 02/07/2022 Cycle 2  atezolizumab/bevacizumab 02/27/2022 Cycle 3 atezolizumab/bevacizumab 03/21/2022 Cycle 4 atezolizumab/bevacizumab 04/22/2022 MRI abdomen 05/05/2022-no change in dominant segment 6 lesion, no change in hyperenhancing lesions in segment 4A and segment 2, no new lesions Cycle 5 atezolizumab/bevacizumab 05/13/2022 Cycle 6 atezolizumab/bevacizumab 06/04/2022 Cycle 7 atezolizumab/bevacizumab 06/24/2022 Cycle 8 atezolizumab/bevacizumab 07/15/2022 MRI abdomen 07/20/2022-mild progression of bilateral liver lesions, no evidence of extrahepatic metastatic disease MRI abdomen [facility] 08/19/2022-increased size of the dominant admitted 5/6 lesion, now measuring 12.7 x 8.8 cm, multiple additional hepatic lesions appear larger than on the previous exam, stable mildly enlarged upper abdominal lymph nodes Lenvatinib 09/09/2022, stopped 10/04/2022 Cabozantinib 10/11/2022 MRI abdomen 12/27/2022-technical limitation on the 07/20/2022 exam makes direct comparison difficult, notes significant change in the enhancing liver lesions Cabozantinib placed on hold 12/31/2022 Seen by Dr. Elby Showers Interventional Radiology 02/05/2023-plan for radiation segmentectomy with split doses between right and left lobe masses 03/21/2023-Y90 left lobe CTs 05/19/2023-widespread heterogeneous low-density throughout the liver consistent with known multifocal hepatocellular carcinoma.  Interval development of a moderate amount of ascites with edema throughout the omental mesenteric fat.  No discrete nodularity to suggest carcinomatosis.  No enlarged abdominal or pelvic lymph nodes.  Portal, superior mesenteric and splenic veins are patent. Ulcerative colitis-maintained on vedolizumab Peripheral arterial disease History of a thoracic aortic aneurysm CAD Diabetes Hypertension Chronic renal failure Paroxysmal atrial fibrillation Admission 01/02/2023 with increased diarrhea-sigmoidoscopy with a single ulcer at the dentate line and an anal fissure, sigmoid colon  unremarkable.  Negative enteric pathogen panel Anemia secondary to chronic disease, renal failure, possible GI bleeding-status post Red cell transfusion 02/14/2023. Marked hyperbilirubinemia 05/15/2023  Disposition: Matthew Chen has hepatocellular carcinoma.  He was seen yesterday with marked hyperbilirubinemia.  CT scan did not show evidence of biliary dilatation.  Disease in the right lobe felt to be slightly progressive.  We reviewed results/images with him and his wife at today's visit.  He understands comparison was difficult due to previous imaging studies being MRIs.  He would like to have an MRI for a better comparison.  We will try to get the MRI completed in the next several days.  He will return for follow-up a  few days later to review results.  We are available to see him sooner if needed.  Patient seen with Dr. Truett Perna.  Lonna Cobb ANP/GNP-BC   05/20/2023  2:06 PM This was a shared visit with Lonna Cobb.  We reviewed the CT findings and images with Matthew Chen.  The etiology of the hyperbilirubinemia remains unclear, but is likely secondary to progression of HCC within the liver, cirrhosis, potentially toxicity from Y90.  He does not appear to have large duct obstruction.  We discussed treatment options with Matthew Chen.  He is leaning to her comfort care, but would like to pursue an MRI of the liver for a direct comparison to May 2024 MRI.  Matthew Chen will return for an office visit after the MRI.  I was present for greater than 50% of today's visit.  I performed medical decision making.  Mancel Bale, MD

## 2023-05-21 ENCOUNTER — Ambulatory Visit (HOSPITAL_COMMUNITY)
Admission: RE | Admit: 2023-05-21 | Discharge: 2023-05-21 | Disposition: A | Discharge status: Home / Self Care | Payer: PPO | Source: Ambulatory Visit | Attending: Nurse Practitioner | Admitting: Nurse Practitioner

## 2023-05-21 ENCOUNTER — Other Ambulatory Visit: Payer: Self-pay | Admitting: *Deleted

## 2023-05-21 DIAGNOSIS — K8020 Calculus of gallbladder without cholecystitis without obstruction: Secondary | ICD-10-CM | POA: Diagnosis not present

## 2023-05-21 DIAGNOSIS — K769 Liver disease, unspecified: Secondary | ICD-10-CM | POA: Diagnosis not present

## 2023-05-21 DIAGNOSIS — C220 Liver cell carcinoma: Secondary | ICD-10-CM

## 2023-05-21 DIAGNOSIS — K7460 Unspecified cirrhosis of liver: Secondary | ICD-10-CM | POA: Diagnosis not present

## 2023-05-21 DIAGNOSIS — R160 Hepatomegaly, not elsewhere classified: Secondary | ICD-10-CM | POA: Diagnosis not present

## 2023-05-21 MED ORDER — GADOBUTROL 1 MMOL/ML IV SOLN
10.0000 mL | Freq: Once | INTRAVENOUS | Status: AC | PRN
  Administered 2023-05-21: 10 mL via INTRAVENOUS

## 2023-05-22 ENCOUNTER — Other Ambulatory Visit: Payer: Self-pay | Admitting: Family Medicine

## 2023-05-22 DIAGNOSIS — K51311 Ulcerative (chronic) rectosigmoiditis with rectal bleeding: Secondary | ICD-10-CM

## 2023-05-22 DIAGNOSIS — K51219 Ulcerative (chronic) proctitis with unspecified complications: Secondary | ICD-10-CM

## 2023-05-22 DIAGNOSIS — R197 Diarrhea, unspecified: Secondary | ICD-10-CM

## 2023-05-22 NOTE — Telephone Encounter (Signed)
Unable to refill per protocol, Rx expired. Discontinued 03/06/23.  Requested Prescriptions  Pending Prescriptions Disp Refills   colestipol (COLESTID) 1 g tablet [Pharmacy Med Name: COLESTIPOL HCL 1 GM TABLET] 60 tablet 0    Sig: TAKE 1 TABLET BY MOUTH 2 TIMES DAILY.     Cardiovascular:  Antilipid - Bile Acid Sequestrants Failed - 05/22/2023 11:03 AM      Failed - Valid encounter within last 12 months    Recent Outpatient Visits           2 years ago Type 2 diabetes mellitus with other specified complication, unspecified whether long term insulin use (HCC)   [facility] Pickard, Priscille Heidelberg, MD   3 years ago Type 2 diabetes mellitus with other specified complication, unspecified whether long term insulin use (HCC)   [facility] Pickard, Priscille Heidelberg, MD   3 years ago Benign essential HTN   [facility] Tanya Nones, Priscille Heidelberg, MD   4 years ago Benign essential HTN   [facility] Tanya Nones, Priscille Heidelberg, MD   4 years ago Benign essential HTN   [facility] Pickard, Priscille Heidelberg, MD              Failed - Lipid Panel in normal range within the last 12 months    Cholesterol, Total  Date Value Ref Range Status  10/07/2016 119 100 - 199 mg/dL Final   Cholesterol  Date Value Ref Range Status  05/27/2022 110 <200 mg/dL Final   LDL Cholesterol (Calc)  Date Value Ref Range Status  05/27/2022 49 mg/dL (calc) Final    Comment:    Reference range: <100 . Desirable range <100 mg/dL for primary prevention;   <70 mg/dL for patients with CHD or diabetic patients  with > or = 2 CHD risk factors. Marland Kitchen LDL-C is now calculated using the Martin-Hopkins  calculation, which is a validated novel method providing  better accuracy than the Friedewald equation in the  estimation of LDL-C.  Horald Pollen et al. Lenox Ahr. 9629;528(41): 2061-2068  (http://education.[employer].com/faq/FAQ164)    HDL  Date Value Ref Range Status   05/27/2022 36 (L) > OR = 40 mg/dL Final  32/44/0102 26 (L) >39 mg/dL Final   Triglycerides  Date Value Ref Range Status  05/27/2022 175 (H) <150 mg/dL Final

## 2023-05-23 ENCOUNTER — Other Ambulatory Visit: Payer: Self-pay | Admitting: Family Medicine

## 2023-05-23 ENCOUNTER — Other Ambulatory Visit: Payer: Self-pay

## 2023-05-23 ENCOUNTER — Telehealth: Payer: Self-pay

## 2023-05-23 DIAGNOSIS — I10 Essential (primary) hypertension: Secondary | ICD-10-CM

## 2023-05-23 DIAGNOSIS — I5021 Acute systolic (congestive) heart failure: Secondary | ICD-10-CM

## 2023-05-23 MED ORDER — METOPROLOL TARTRATE 50 MG PO TABS: 50.0000 mg | ORAL_TABLET | Freq: Two times a day (BID) | ORAL | 1 refills | Status: AC

## 2023-05-23 NOTE — Telephone Encounter (Signed)
Requested Prescriptions  Refused Prescriptions Disp Refills   metoprolol tartrate (LOPRESSOR) 50 MG tablet [Pharmacy Med Name: METOPROLOL TARTRATE 50 MG TAB] 60 tablet 0    Sig: TAKE 1 TABLET BY MOUTH TWICE A DAY     Cardiovascular:  Beta Blockers Failed - 05/23/2023  4:15 PM      Failed - Valid encounter within last 6 months    Recent Outpatient Visits           2 years ago Type 2 diabetes mellitus with other specified complication, unspecified whether long term insulin use (HCC)   [facility] Pickard, Priscille Heidelberg, MD   3 years ago Type 2 diabetes mellitus with other specified complication, unspecified whether long term insulin use (HCC)   [facility] Pickard, Priscille Heidelberg, MD   3 years ago Benign essential HTN   [facility] Donita Brooks, MD   4 years ago Benign essential HTN   [facility] Tanya Nones, Priscille Heidelberg, MD   4 years ago Benign essential HTN   [facility] Pickard, Priscille Heidelberg, MD              Passed - Last BP in normal range    BP Readings from Last 1 Encounters:  05/20/23 130/70         Passed - Last Heart Rate in normal range    Pulse Readings from Last 1 Encounters:  05/20/23 65

## 2023-05-23 NOTE — Telephone Encounter (Signed)
Pt called in about getting this med refilled metoprolol tartrate (LOPRESSOR) 50 MG tablet [[id number]]  ENDED. Pt was told by pharmacy that they would not refill this med because the prescription had ended. Pt stated that he is still taking this med but is completely out of this med. Pt would like to know if he should still be taking this med as well? Please advise.    LOV: 04/28/23  PHARMACY: [id number] [place], [place] - [address] [place] [address] Phone: [phone]  Fax: [phone]  CB#: [phone]

## 2023-05-26 ENCOUNTER — Inpatient Hospital Stay: Payer: PPO | Admitting: Oncology

## 2023-05-26 ENCOUNTER — Other Ambulatory Visit: Payer: Self-pay | Admitting: *Deleted

## 2023-05-26 ENCOUNTER — Ambulatory Visit (HOSPITAL_COMMUNITY)
Admission: RE | Admit: 2023-05-26 | Discharge: 2023-05-26 | Disposition: A | Discharge status: Home / Self Care | Payer: PPO | Source: Ambulatory Visit | Attending: Oncology | Admitting: Oncology

## 2023-05-26 ENCOUNTER — Inpatient Hospital Stay: Payer: PPO

## 2023-05-26 VITALS — BP 138/74 | HR 86 | Temp 97.9°F | Resp 18 | Ht 70.0 in | Wt 198.0 lb

## 2023-05-26 DIAGNOSIS — R188 Other ascites: Secondary | ICD-10-CM | POA: Diagnosis not present

## 2023-05-26 DIAGNOSIS — C220 Liver cell carcinoma: Secondary | ICD-10-CM | POA: Diagnosis not present

## 2023-05-26 LAB — CMP (CANCER CENTER ONLY)
ALT: 41 U/L (ref 0–44)
AST: 80 U/L — ABNORMAL HIGH (ref 15–41)
Albumin: 2.6 g/dL — ABNORMAL LOW (ref 3.5–5.0)
Alkaline Phosphatase: 371 U/L — ABNORMAL HIGH (ref 38–126)
Anion gap: 9 (ref 5–15)
BUN: 20 mg/dL (ref 8–23)
CO2: 23 mmol/L (ref 22–32)
Calcium: 9.2 mg/dL (ref 8.9–10.3)
Chloride: 100 mmol/L (ref 98–111)
Creatinine: 1.21 mg/dL (ref 0.61–1.24)
GFR, Estimated: >60 mL/min (ref >60)
Glucose, Bld: 180 mg/dL — ABNORMAL HIGH (ref 70–99)
Potassium: 4.2 mmol/L (ref 3.5–5.1)
Sodium: 132 mmol/L — ABNORMAL LOW (ref 135–145)
Total Bilirubin: 9.8 mg/dL (ref 0.3–1.2)
Total Protein: 6.5 g/dL (ref 6.5–8.1)

## 2023-05-26 MED ORDER — LIDOCAINE HCL 1 % IJ SOLN
INTRAMUSCULAR | Status: AC
  Filled 2023-05-26: qty 20

## 2023-05-26 NOTE — Procedures (Signed)
Ultrasound-guided therapeutic paracentesis performed yielding 4 liters of golden yellow  fluid. No immediate complications. EBL none.

## 2023-05-26 NOTE — Progress Notes (Signed)
[facility] OFFICE PROGRESS NOTE   Diagnosis: Hepatocellular carcinoma  INTERVAL HISTORY:   Mr. Stigger returns as scheduled.  He complains of increased abdominal distention.  He has abdomen and back pain.  He has not taken hydrocodone.  Pruritus has improved.  He reports numbness of the lower lip.  Objective:  Vital signs in last 24 hours:  Blood pressure 138/74, pulse 86, temperature 97.9 F (36.6 C), temperature source Temporal, resp. rate 18, height 5\' 10"  (1.778 m), weight 198 lb (89.8 kg), SpO2 99%.    HEENT: Scleral icterus Resp: Lungs clear bilaterally Cardio: Regular rate and rhythm GI: No splenomegaly, distended with ascites, the liver edge is palpable in the right lateral subcostal region Vascular: Pitting edema to lower leg bilaterally  Skin: Jaundice  Portacath/PICC-without erythema  Lab Results:  Lab Results  Component Value Date   WBC 9.3 05/19/2023   HGB 12.0 (L) 05/19/2023   HCT 38.0 (L) 05/19/2023   MCV 96.7 05/19/2023   PLT 252 05/19/2023   NEUTROABS 7.7 05/19/2023    CMP  Lab Results  Component Value Date   NA 136 05/19/2023   K 4.2 05/19/2023   CL 102 05/19/2023   CO2 25 05/19/2023   GLUCOSE 157 (H) 05/19/2023   BUN 31 (H) 05/19/2023   CREATININE 1.24 05/19/2023   CALCIUM 9.2 05/19/2023   PROT 6.3 (L) 05/19/2023   ALBUMIN 2.6 (L) 05/19/2023   AST 88 (H) 05/19/2023   ALT 53 (H) 05/19/2023   ALKPHOS 318 (H) 05/19/2023   BILITOT 7.6 (HH) 05/19/2023   GFRNONAA >60 05/19/2023   GFRAA 49 (L) 11/20/2020    Lab Results  Component Value Date   CEA1 1.9 06/21/2021     Medications: I have reviewed the patient's current medications.   Assessment/Plan: Hepatocellular carcinoma CT angio chest aorta 01/30/2021-no evidence of thoracic aortic aneurysm, diffuse hepatic steatosis, suggestion of early cirrhosis, 7.1 cm enhancing masslike area in segment 5/6, prominent gastrohepatic and periportal nodes measuring up to 7 mm MRI liver  03/22/2021-arterial hyperenhancing subcapsular mass, segment 6, 7 x 5.1 cm, LI-RADS 5, no pathologically large lymph nodes Ultrasound-guided biopsy 04/24/2021-hepatocellular carcinoma Y90 radioembolization of segment 6 and segment 7 hepatic arterial branches 06/21/2021 MRI abdomen 09/03/2021-new increased enhancement inferior and superior to the dominant right liver lesion, new 11 mm enhancing lesion in the left liver LIRADS 3, dominant right hepatic lesion stable in size MRI abdomen 12/19/2021-unchanged hyperenhancing mass in the anterior inferior right liver measuring 7.8 x 5 cm, interval enlargement of a arterial enhancing lesion in segment 2- LIRADS 5, new small enhancing lesions hepatic segment 4A measuring 1.2 x 0.9 cm and 0.8 x 0.6 cm- LIRADS 5 Cycle 1 atezolizumab /bevacizumab 02/07/2022 Cycle 2 atezolizumab/bevacizumab 02/27/2022 Cycle 3 atezolizumab/bevacizumab 03/21/2022 Cycle 4 atezolizumab/bevacizumab 04/22/2022 MRI abdomen 05/05/2022-no change in dominant segment 6 lesion, no change in hyperenhancing lesions in segment 4A and segment 2, no new lesions Cycle 5 atezolizumab/bevacizumab 05/13/2022 Cycle 6 atezolizumab/bevacizumab 06/04/2022 Cycle 7 atezolizumab/bevacizumab 06/24/2022 Cycle 8 atezolizumab/bevacizumab 07/15/2022 MRI abdomen 07/20/2022-mild progression of bilateral liver lesions, no evidence of extrahepatic metastatic disease MRI abdomen [facility] 08/19/2022-increased size of the dominant admitted 5/6 lesion, now measuring 12.7 x 8.8 cm, multiple additional hepatic lesions appear larger than on the previous exam, stable mildly enlarged upper abdominal lymph nodes Lenvatinib 09/09/2022, stopped 10/04/2022 Cabozantinib 10/11/2022 MRI abdomen 12/27/2022-technical limitation on the 07/20/2022 exam makes direct comparison difficult, notes significant change in the enhancing liver lesions Cabozantinib placed on hold 12/31/2022 Seen by Dr. Elby Showers  Interventional Radiology 02/05/2023-plan for radiation  segmentectomy with split doses between right and left lobe masses 03/21/2023-Y90 left lobe CTs 05/19/2023-widespread heterogeneous low-density throughout the liver consistent with known multifocal hepatocellular carcinoma.  Interval development of a moderate amount of ascites with edema throughout the omental mesenteric fat.  No discrete nodularity to suggest carcinomatosis.  No enlarged abdominal or pelvic lymph nodes.  Portal, superior mesenteric and splenic veins are patent. MRI abdomen 05/21/2023-extensive hyperenhancing lesions throughout the liver consistent with progression of multifocal hepatocellular carcinoma, innumerable enhancing osseous metastases large volume ascites, cirrhosis Ulcerative colitis-maintained on vedolizumab Peripheral arterial disease History of a thoracic aortic aneurysm CAD Diabetes Hypertension Chronic renal failure Paroxysmal atrial fibrillation Admission 01/02/2023 with increased diarrhea-sigmoidoscopy with a single ulcer at the dentate line and an anal fissure, sigmoid colon unremarkable.  Negative enteric pathogen panel Anemia secondary to chronic disease, renal failure, possible GI bleeding-status post Red cell transfusion 02/14/2023. Marked hyperbilirubinemia 05/15/2023   Disposition: Mr. Hartel has hepatocellular carcinoma.  I reviewed the restaging MRI findings and images with Mr. Herbers and his wife.  There is clinical and radiologic evidence of disease progression.  The hyperbilirubinemia is likely related to extensive disease involving the liver.  He appears to have bone metastases.  Mr. Emanuele does not wish to pursue additional systemic therapy.  He agrees to a hospice referral.  We discussed CPR and ACLS.  He will be placed on a no CODE BLUE status.  He will be referred for a therapeutic paracentesis.  We consolidated his medical regimen.  He will return for an office visit in 3-4 weeks.  I am available to see him in the interim as needed.  Thornton Papas,  MD  05/26/2023  8:30 AM

## 2023-05-26 NOTE — Progress Notes (Signed)
CRITICAL VALUE STICKER  CRITICAL VALUE:9.8 Total bilirubin  RECEIVER (on-site recipient of call):Betsey Sossamon M.  DATE & TIME NOTIFIED: 05/26/2023  MESSENGER (representative from lab):ALA  MD NOTIFIED: Thornton Papas  TIME OF NOTIFICATION:0852  RESPONSE: The patient is being seen by the provider.

## 2023-05-29 ENCOUNTER — Inpatient Hospital Stay: Payer: PPO

## 2023-05-29 ENCOUNTER — Telehealth: Payer: Self-pay

## 2023-05-29 ENCOUNTER — Ambulatory Visit: Payer: PPO | Admitting: Oncology

## 2023-05-29 ENCOUNTER — Other Ambulatory Visit: Payer: Self-pay

## 2023-05-29 DIAGNOSIS — C220 Liver cell carcinoma: Secondary | ICD-10-CM

## 2023-05-29 MED ORDER — HYDROXYZINE HCL 25 MG PO TABS: 25.0000 mg | ORAL_TABLET | Freq: Three times a day (TID) | ORAL | 0 refills | Status: DC | PRN

## 2023-05-29 NOTE — Telephone Encounter (Signed)
The patient called to report persistent itching and indicated that Benadryl is not alleviating his symptoms. He is currently taking Benadryl three times a day. He would like to discuss alternative medication options that may provide relief.

## 2023-06-03 ENCOUNTER — Telehealth: Payer: Self-pay

## 2023-06-03 ENCOUNTER — Other Ambulatory Visit: Payer: Self-pay

## 2023-06-03 DIAGNOSIS — C220 Liver cell carcinoma: Secondary | ICD-10-CM

## 2023-06-03 NOTE — Telephone Encounter (Signed)
Harriett Sine from [facility] called to report that the patient is experiencing shortness of breath and may require a paracentesis. I spoke with the patient, who confirms he is experiencing shortness of breath and believes he needs fluid to be drained. I will discuss his concerns with the provider and have placed the order to schedule the necessary procedure.

## 2023-06-04 ENCOUNTER — Telehealth: Payer: Self-pay | Admitting: *Deleted

## 2023-06-04 NOTE — Telephone Encounter (Signed)
Having increased pitting edema in feet, but also reporting patient feels that his abdomen is full and requests a paracentesis. Nurse called back to report she spoke with patient and is aware his paracentesis is 10/24 at [facility]

## 2023-06-05 ENCOUNTER — Ambulatory Visit (HOSPITAL_COMMUNITY)
Admission: RE | Admit: 2023-06-05 | Discharge: 2023-06-05 | Disposition: A | Discharge status: Home / Self Care | Payer: PPO | Source: Ambulatory Visit | Attending: Oncology | Admitting: Oncology

## 2023-06-05 ENCOUNTER — Telehealth: Payer: Self-pay | Admitting: *Deleted

## 2023-06-05 DIAGNOSIS — C220 Liver cell carcinoma: Secondary | ICD-10-CM | POA: Diagnosis not present

## 2023-06-05 DIAGNOSIS — R188 Other ascites: Secondary | ICD-10-CM | POA: Diagnosis not present

## 2023-06-05 MED ORDER — LIDOCAINE HCL 1 % IJ SOLN
INTRAMUSCULAR | Status: AC
  Filled 2023-06-05: qty 20

## 2023-06-05 NOTE — Procedures (Signed)
Ultrasound-guided therapeutic paracentesis performed yielding 4 liters of golden yellow  fluid. No immediate complications. EBL none.

## 2023-06-05 NOTE — Telephone Encounter (Signed)
Hospice nurse reports Matthew Chen had increase in pain today. Usually takes 1-2 Hydrocodone daily, has had to take #2 already today for back pain and still uncomfortable. En route to paracentesis today. Instructed her to have him increase pain med per Dr. Truett Perna to #2 every 6 hours and call tomorrow morning with update. Hopeful his pain may lessen after the procedure.

## 2023-06-06 ENCOUNTER — Other Ambulatory Visit: Payer: Self-pay | Admitting: Oncology

## 2023-06-06 ENCOUNTER — Telehealth: Payer: Self-pay

## 2023-06-06 MED ORDER — OXYCODONE HCL 5 MG PO TABS: 5.0000 mg | ORAL_TABLET | ORAL | 0 refills | Status: DC | PRN

## 2023-06-06 NOTE — Telephone Encounter (Signed)
The patient called to request a refill of his pain medication. He reported taking two tablets every six hours.

## 2023-06-16 ENCOUNTER — Telehealth: Payer: Self-pay | Admitting: *Deleted

## 2023-06-16 NOTE — Telephone Encounter (Signed)
Call from Hospice: Patient seems to be transitioning now. Is in hospital bed and stares out. Will respond if you speak to him. Having hallucinations and more jaundiced. 2+ pitting edema in legs. Wife asking if his meds can be discontinued except for pain med and nurse asking for Ativan.  Per Dr. Truett Perna: OK to d/c all meds wife requests and activation of standing order for ativan.

## 2023-06-25 ENCOUNTER — Ambulatory Visit: Payer: PPO | Admitting: Gastroenterology

## 2023-06-26 ENCOUNTER — Inpatient Hospital Stay: Payer: PPO | Attending: Oncology | Admitting: Oncology

## 2023-06-27 ENCOUNTER — Telehealth: Payer: Self-pay

## 2023-06-30 NOTE — Telephone Encounter (Signed)
DUPLICATE MESSAGE.   Copied from CRM [id number]. Topic: General - Deceased Patient >> 07/14/2023  2:34 PM Almira Coaster wrote: Name of caller: Gwinda Passe  Date of death: 14-Jul-2023   Name of funeral home:   Phone number of funeral home:   Provider that needs to sign form:   Timeline for signing:

## 2023-07-03 ENCOUNTER — Telehealth: Payer: Self-pay

## 2023-07-03 NOTE — Telephone Encounter (Signed)
Copied from CRM [id number]. Topic: General - Deceased Patient >> 07/26/23  2:34 PM Almira Coaster wrote: Name of caller: Gwinda Passe  Date of death: 07-26-23   Name of funeral home:   Phone number of funeral home:   Provider that needs to sign form:   Timeline for signing:

## 2023-07-13 NOTE — Telephone Encounter (Signed)
FYI from Mrs. Fronda. Thank you.   Copied from CRM [id number]. Topic: General - Deceased Patient >> 01-Jul-2023  2:34 PM Almira Coaster wrote: Name of caller: Gwinda Passe  Date of death: 07-01-23

## 2023-07-13 NOTE — Telephone Encounter (Signed)
FY from Mrs. Buren.  Copied from CRM [id number]. Topic: General - Deceased Patient >> July 09, 2023  2:34 PM Almira Coaster wrote: Name of caller: Gwinda Passe  Date of death: 07-09-2023

## 2023-07-13 NOTE — Telephone Encounter (Signed)
Copied from CRM [id number]. Topic: General - Deceased Patient >> 07/26/23  2:34 PM Almira Coaster wrote: Name of caller: Gwinda Passe  Date of death: 07-26-23   Name of funeral home:   Phone number of funeral home:   Provider that needs to sign form:   Timeline for signing:

## 2023-07-13 NOTE — Telephone Encounter (Signed)
Copied from CRM [id number]. Topic: General - Deceased Patient >> 07-15-2023  2:34 PM Almira Coaster wrote: Name of caller: Gwinda Passe  Date of death: 2023-07-15   Name of funeral home:   Phone number of funeral home:   Provider that needs to sign form:   Timeline for signing:

## 2023-07-13 DEATH — deceased
